# Patient Record
Sex: Female | Born: 1945 | Race: White | Hispanic: No | Marital: Married | Smoking: Never smoker
Health system: Southern US, Community
[De-identification: ages and names within clinical notes are randomized; demographics above are authoritative.]

## PROBLEM LIST (undated history)

## (undated) DIAGNOSIS — R600 Localized edema: Secondary | ICD-10-CM

## (undated) DIAGNOSIS — L03116 Cellulitis of left lower limb: Secondary | ICD-10-CM

## (undated) DIAGNOSIS — K5792 Diverticulitis of intestine, part unspecified, without perforation or abscess without bleeding: Secondary | ICD-10-CM

## (undated) DIAGNOSIS — M8580 Other specified disorders of bone density and structure, unspecified site: Secondary | ICD-10-CM

## (undated) DIAGNOSIS — E1149 Type 2 diabetes mellitus with other diabetic neurological complication: Secondary | ICD-10-CM

## (undated) DIAGNOSIS — E785 Hyperlipidemia, unspecified: Secondary | ICD-10-CM

## (undated) DIAGNOSIS — I872 Venous insufficiency (chronic) (peripheral): Secondary | ICD-10-CM

## (undated) DIAGNOSIS — I10 Essential (primary) hypertension: Secondary | ICD-10-CM

## (undated) HISTORY — DX: Hyperlipidemia, unspecified: E78.5

## (undated) HISTORY — PX: COLECTOMY: SHX59

## (undated) HISTORY — DX: Essential (primary) hypertension: I10

## (undated) HISTORY — PX: CHOLECYSTECTOMY: SHX55

## (undated) HISTORY — DX: Cellulitis of left lower limb: L03.116

## (undated) HISTORY — PX: APPENDECTOMY: SHX54

## (undated) HISTORY — PX: ABDOMINAL HYSTERECTOMY: SHX81

## (undated) HISTORY — DX: Other specified disorders of bone density and structure, unspecified site: M85.80

## (undated) HISTORY — DX: Diverticulitis of intestine, part unspecified, without perforation or abscess without bleeding: K57.92

## (undated) HISTORY — DX: Type 2 diabetes mellitus with other diabetic neurological complication: E11.49

## (undated) HISTORY — DX: Morbid (severe) obesity due to excess calories: E66.01

## (undated) HISTORY — DX: Venous insufficiency (chronic) (peripheral): I87.2

## (undated) HISTORY — DX: Localized edema: R60.0

---

## 1997-10-05 ENCOUNTER — Emergency Department (HOSPITAL_COMMUNITY): Admission: EM | Admit: 1997-10-05 | Discharge: 1997-10-05 | Payer: Self-pay | Admitting: Endocrinology

## 1997-10-07 ENCOUNTER — Emergency Department (HOSPITAL_COMMUNITY): Admission: EM | Admit: 1997-10-07 | Discharge: 1997-10-07 | Payer: Self-pay | Admitting: Endocrinology

## 1997-10-12 ENCOUNTER — Emergency Department (HOSPITAL_COMMUNITY): Admission: EM | Admit: 1997-10-12 | Discharge: 1997-10-12 | Payer: Self-pay | Admitting: Endocrinology

## 2000-05-12 ENCOUNTER — Emergency Department (HOSPITAL_COMMUNITY): Admission: EM | Admit: 2000-05-12 | Discharge: 2000-05-12 | Payer: Self-pay | Admitting: Emergency Medicine

## 2000-09-05 ENCOUNTER — Emergency Department (HOSPITAL_COMMUNITY): Admission: EM | Admit: 2000-09-05 | Discharge: 2000-09-05 | Payer: Self-pay

## 2000-10-07 ENCOUNTER — Emergency Department (HOSPITAL_COMMUNITY): Admission: EM | Admit: 2000-10-07 | Discharge: 2000-10-07 | Payer: Self-pay | Admitting: Emergency Medicine

## 2000-10-09 ENCOUNTER — Emergency Department (HOSPITAL_COMMUNITY): Admission: EM | Admit: 2000-10-09 | Discharge: 2000-10-09 | Payer: Self-pay | Admitting: Emergency Medicine

## 2000-10-22 ENCOUNTER — Ambulatory Visit (HOSPITAL_COMMUNITY): Admission: RE | Admit: 2000-10-22 | Discharge: 2000-10-22 | Payer: Self-pay | Admitting: Internal Medicine

## 2000-10-22 ENCOUNTER — Encounter: Admission: RE | Admit: 2000-10-22 | Discharge: 2000-10-22 | Payer: Self-pay | Admitting: Internal Medicine

## 2000-11-04 ENCOUNTER — Ambulatory Visit (HOSPITAL_COMMUNITY): Admission: RE | Admit: 2000-11-04 | Discharge: 2000-11-04 | Payer: Self-pay | Admitting: Internal Medicine

## 2000-11-09 ENCOUNTER — Encounter: Admission: RE | Admit: 2000-11-09 | Discharge: 2000-11-09 | Payer: Self-pay | Admitting: Internal Medicine

## 2000-11-10 ENCOUNTER — Ambulatory Visit (HOSPITAL_COMMUNITY): Admission: RE | Admit: 2000-11-10 | Discharge: 2000-11-10 | Payer: Self-pay

## 2000-11-23 ENCOUNTER — Encounter: Admission: RE | Admit: 2000-11-23 | Discharge: 2000-11-23 | Payer: Self-pay | Admitting: Internal Medicine

## 2001-01-31 ENCOUNTER — Encounter: Admission: RE | Admit: 2001-01-31 | Discharge: 2001-01-31 | Payer: Self-pay | Admitting: Internal Medicine

## 2001-08-22 ENCOUNTER — Encounter: Admission: RE | Admit: 2001-08-22 | Discharge: 2001-08-22 | Payer: Self-pay | Admitting: Internal Medicine

## 2001-08-22 ENCOUNTER — Ambulatory Visit (HOSPITAL_COMMUNITY): Admission: RE | Admit: 2001-08-22 | Discharge: 2001-08-22 | Payer: Self-pay | Admitting: Internal Medicine

## 2001-08-24 ENCOUNTER — Encounter: Admission: RE | Admit: 2001-08-24 | Discharge: 2001-08-24 | Payer: Self-pay | Admitting: Internal Medicine

## 2001-09-30 ENCOUNTER — Encounter: Admission: RE | Admit: 2001-09-30 | Discharge: 2001-09-30 | Payer: Self-pay | Admitting: Internal Medicine

## 2001-10-05 ENCOUNTER — Encounter: Admission: RE | Admit: 2001-10-05 | Discharge: 2001-10-05 | Payer: Self-pay | Admitting: Internal Medicine

## 2001-10-07 ENCOUNTER — Encounter: Admission: RE | Admit: 2001-10-07 | Discharge: 2001-10-07 | Payer: Self-pay | Admitting: Internal Medicine

## 2001-11-10 ENCOUNTER — Encounter: Admission: RE | Admit: 2001-11-10 | Discharge: 2001-11-10 | Payer: Self-pay | Admitting: Internal Medicine

## 2001-12-05 ENCOUNTER — Emergency Department (HOSPITAL_COMMUNITY): Admission: EM | Admit: 2001-12-05 | Discharge: 2001-12-05 | Payer: Self-pay | Admitting: Emergency Medicine

## 2001-12-05 ENCOUNTER — Encounter: Payer: Self-pay | Admitting: *Deleted

## 2002-02-05 ENCOUNTER — Emergency Department (HOSPITAL_COMMUNITY): Admission: EM | Admit: 2002-02-05 | Discharge: 2002-02-05 | Payer: Self-pay | Admitting: Emergency Medicine

## 2002-04-06 ENCOUNTER — Encounter: Admission: RE | Admit: 2002-04-06 | Discharge: 2002-04-06 | Payer: Self-pay | Admitting: Internal Medicine

## 2002-04-11 ENCOUNTER — Ambulatory Visit (HOSPITAL_COMMUNITY): Admission: RE | Admit: 2002-04-11 | Discharge: 2002-04-11 | Payer: Self-pay | Admitting: Internal Medicine

## 2002-06-14 ENCOUNTER — Encounter: Admission: RE | Admit: 2002-06-14 | Discharge: 2002-06-14 | Payer: Self-pay | Admitting: Internal Medicine

## 2002-06-14 ENCOUNTER — Encounter: Payer: Self-pay | Admitting: Internal Medicine

## 2002-06-14 ENCOUNTER — Ambulatory Visit (HOSPITAL_COMMUNITY): Admission: RE | Admit: 2002-06-14 | Discharge: 2002-06-14 | Payer: Self-pay | Admitting: Internal Medicine

## 2002-07-05 ENCOUNTER — Emergency Department (HOSPITAL_COMMUNITY): Admission: EM | Admit: 2002-07-05 | Discharge: 2002-07-05 | Payer: Self-pay | Admitting: Emergency Medicine

## 2002-07-06 ENCOUNTER — Encounter: Admission: RE | Admit: 2002-07-06 | Discharge: 2002-07-06 | Payer: Self-pay | Admitting: Internal Medicine

## 2002-08-31 ENCOUNTER — Encounter: Admission: RE | Admit: 2002-08-31 | Discharge: 2002-08-31 | Payer: Self-pay | Admitting: Internal Medicine

## 2002-09-21 ENCOUNTER — Emergency Department (HOSPITAL_COMMUNITY): Admission: EM | Admit: 2002-09-21 | Discharge: 2002-09-22 | Payer: Self-pay | Admitting: Emergency Medicine

## 2002-09-22 ENCOUNTER — Encounter: Payer: Self-pay | Admitting: Emergency Medicine

## 2002-10-02 ENCOUNTER — Encounter: Admission: RE | Admit: 2002-10-02 | Discharge: 2002-10-02 | Payer: Self-pay | Admitting: Internal Medicine

## 2002-10-04 ENCOUNTER — Encounter: Admission: RE | Admit: 2002-10-04 | Discharge: 2002-10-04 | Payer: Self-pay | Admitting: Internal Medicine

## 2003-03-15 ENCOUNTER — Encounter: Admission: RE | Admit: 2003-03-15 | Discharge: 2003-03-15 | Payer: Self-pay | Admitting: Internal Medicine

## 2003-11-08 ENCOUNTER — Encounter: Admission: RE | Admit: 2003-11-08 | Discharge: 2003-11-08 | Payer: Self-pay | Admitting: Internal Medicine

## 2003-11-16 ENCOUNTER — Encounter: Admission: RE | Admit: 2003-11-16 | Discharge: 2003-11-16 | Payer: Self-pay | Admitting: Internal Medicine

## 2003-11-23 ENCOUNTER — Ambulatory Visit: Payer: Self-pay | Admitting: Internal Medicine

## 2003-12-25 ENCOUNTER — Ambulatory Visit: Payer: Self-pay | Admitting: Internal Medicine

## 2004-01-08 ENCOUNTER — Ambulatory Visit: Payer: Self-pay | Admitting: Internal Medicine

## 2004-01-22 ENCOUNTER — Ambulatory Visit: Payer: Self-pay | Admitting: Internal Medicine

## 2004-01-29 ENCOUNTER — Ambulatory Visit: Payer: Self-pay | Admitting: Internal Medicine

## 2004-02-29 ENCOUNTER — Ambulatory Visit: Payer: Self-pay | Admitting: Internal Medicine

## 2004-03-03 ENCOUNTER — Ambulatory Visit: Payer: Self-pay | Admitting: Internal Medicine

## 2004-03-14 ENCOUNTER — Ambulatory Visit (HOSPITAL_COMMUNITY): Admission: RE | Admit: 2004-03-14 | Discharge: 2004-03-14 | Payer: Self-pay | Admitting: Internal Medicine

## 2004-05-20 ENCOUNTER — Ambulatory Visit: Payer: Self-pay | Admitting: Internal Medicine

## 2004-05-27 ENCOUNTER — Ambulatory Visit: Payer: Self-pay | Admitting: Internal Medicine

## 2004-06-16 ENCOUNTER — Emergency Department (HOSPITAL_COMMUNITY): Admission: EM | Admit: 2004-06-16 | Discharge: 2004-06-16 | Payer: Self-pay | Admitting: Emergency Medicine

## 2004-07-24 ENCOUNTER — Ambulatory Visit (HOSPITAL_COMMUNITY): Admission: RE | Admit: 2004-07-24 | Discharge: 2004-07-24 | Payer: Self-pay | Admitting: Internal Medicine

## 2004-07-24 ENCOUNTER — Ambulatory Visit: Payer: Self-pay | Admitting: Internal Medicine

## 2004-07-31 ENCOUNTER — Ambulatory Visit: Payer: Self-pay | Admitting: Internal Medicine

## 2004-08-08 ENCOUNTER — Ambulatory Visit: Payer: Self-pay | Admitting: Internal Medicine

## 2004-08-18 ENCOUNTER — Emergency Department (HOSPITAL_COMMUNITY): Admission: EM | Admit: 2004-08-18 | Discharge: 2004-08-18 | Payer: Self-pay | Admitting: Emergency Medicine

## 2005-02-17 ENCOUNTER — Ambulatory Visit: Payer: Self-pay | Admitting: Internal Medicine

## 2005-04-12 ENCOUNTER — Emergency Department (HOSPITAL_COMMUNITY): Admission: EM | Admit: 2005-04-12 | Discharge: 2005-04-12 | Payer: Self-pay | Admitting: Emergency Medicine

## 2005-08-26 ENCOUNTER — Ambulatory Visit: Payer: Self-pay | Admitting: Internal Medicine

## 2005-08-26 ENCOUNTER — Inpatient Hospital Stay (HOSPITAL_COMMUNITY): Admission: RE | Admit: 2005-08-26 | Discharge: 2005-08-28 | Payer: Self-pay | Admitting: Internal Medicine

## 2005-09-22 ENCOUNTER — Ambulatory Visit: Payer: Self-pay | Admitting: Internal Medicine

## 2005-09-28 ENCOUNTER — Ambulatory Visit (HOSPITAL_COMMUNITY): Admission: RE | Admit: 2005-09-28 | Discharge: 2005-09-28 | Payer: Self-pay | Admitting: Internal Medicine

## 2005-09-28 ENCOUNTER — Encounter (INDEPENDENT_AMBULATORY_CARE_PROVIDER_SITE_OTHER): Payer: Self-pay | Admitting: Internal Medicine

## 2005-10-06 ENCOUNTER — Ambulatory Visit: Payer: Self-pay | Admitting: Internal Medicine

## 2005-11-16 ENCOUNTER — Ambulatory Visit: Payer: Self-pay | Admitting: Internal Medicine

## 2005-11-16 ENCOUNTER — Inpatient Hospital Stay (HOSPITAL_COMMUNITY): Admission: EM | Admit: 2005-11-16 | Discharge: 2005-11-17 | Payer: Self-pay | Admitting: *Deleted

## 2005-11-20 ENCOUNTER — Ambulatory Visit: Payer: Self-pay | Admitting: Internal Medicine

## 2005-11-30 ENCOUNTER — Ambulatory Visit: Payer: Self-pay | Admitting: Internal Medicine

## 2006-02-03 ENCOUNTER — Ambulatory Visit: Payer: Self-pay | Admitting: Hospitalist

## 2006-03-08 DIAGNOSIS — E118 Type 2 diabetes mellitus with unspecified complications: Secondary | ICD-10-CM | POA: Insufficient documentation

## 2006-03-08 DIAGNOSIS — I10 Essential (primary) hypertension: Secondary | ICD-10-CM | POA: Insufficient documentation

## 2006-03-08 DIAGNOSIS — E1140 Type 2 diabetes mellitus with diabetic neuropathy, unspecified: Secondary | ICD-10-CM

## 2006-03-08 DIAGNOSIS — Z794 Long term (current) use of insulin: Secondary | ICD-10-CM | POA: Insufficient documentation

## 2006-03-08 DIAGNOSIS — L03119 Cellulitis of unspecified part of limb: Secondary | ICD-10-CM

## 2006-03-08 DIAGNOSIS — L02419 Cutaneous abscess of limb, unspecified: Secondary | ICD-10-CM | POA: Insufficient documentation

## 2006-03-08 DIAGNOSIS — E1165 Type 2 diabetes mellitus with hyperglycemia: Secondary | ICD-10-CM

## 2006-04-12 DIAGNOSIS — M949 Disorder of cartilage, unspecified: Secondary | ICD-10-CM

## 2006-04-12 DIAGNOSIS — M899 Disorder of bone, unspecified: Secondary | ICD-10-CM | POA: Insufficient documentation

## 2006-05-20 ENCOUNTER — Encounter (INDEPENDENT_AMBULATORY_CARE_PROVIDER_SITE_OTHER): Payer: Self-pay | Admitting: *Deleted

## 2006-05-20 ENCOUNTER — Ambulatory Visit: Payer: Self-pay | Admitting: Hospitalist

## 2006-05-20 ENCOUNTER — Telehealth (INDEPENDENT_AMBULATORY_CARE_PROVIDER_SITE_OTHER): Payer: Self-pay | Admitting: *Deleted

## 2006-05-20 LAB — CONVERTED CEMR LAB
Blood Glucose, Fingerstick: 191
Hgb A1c MFr Bld: 7.3 %

## 2006-05-21 ENCOUNTER — Ambulatory Visit: Payer: Self-pay | Admitting: Internal Medicine

## 2006-05-21 ENCOUNTER — Encounter (INDEPENDENT_AMBULATORY_CARE_PROVIDER_SITE_OTHER): Payer: Self-pay | Admitting: *Deleted

## 2006-05-21 LAB — CONVERTED CEMR LAB
BUN: 12 mg/dL (ref 6–23)
CO2: 27 meq/L (ref 19–32)
Calcium: 9.1 mg/dL (ref 8.4–10.5)
Potassium: 4.2 meq/L (ref 3.5–5.3)

## 2006-05-23 ENCOUNTER — Ambulatory Visit (HOSPITAL_COMMUNITY): Admission: RE | Admit: 2006-05-23 | Discharge: 2006-05-23 | Payer: Self-pay | Admitting: Internal Medicine

## 2006-05-28 ENCOUNTER — Telehealth: Payer: Self-pay | Admitting: *Deleted

## 2006-05-28 ENCOUNTER — Ambulatory Visit: Payer: Self-pay | Admitting: Internal Medicine

## 2006-08-18 ENCOUNTER — Telehealth: Payer: Self-pay | Admitting: *Deleted

## 2006-11-16 ENCOUNTER — Emergency Department (HOSPITAL_COMMUNITY): Admission: EM | Admit: 2006-11-16 | Discharge: 2006-11-16 | Payer: Self-pay | Admitting: Emergency Medicine

## 2007-01-17 ENCOUNTER — Ambulatory Visit: Payer: Self-pay | Admitting: Infectious Diseases

## 2007-02-14 ENCOUNTER — Telehealth: Payer: Self-pay | Admitting: *Deleted

## 2007-04-12 ENCOUNTER — Ambulatory Visit: Payer: Self-pay | Admitting: Infectious Disease

## 2007-04-12 ENCOUNTER — Encounter (INDEPENDENT_AMBULATORY_CARE_PROVIDER_SITE_OTHER): Payer: Self-pay | Admitting: Internal Medicine

## 2007-04-12 DIAGNOSIS — R300 Dysuria: Secondary | ICD-10-CM | POA: Insufficient documentation

## 2007-04-12 DIAGNOSIS — K921 Melena: Secondary | ICD-10-CM | POA: Insufficient documentation

## 2007-04-13 ENCOUNTER — Encounter (INDEPENDENT_AMBULATORY_CARE_PROVIDER_SITE_OTHER): Payer: Self-pay | Admitting: Internal Medicine

## 2007-04-13 LAB — CONVERTED CEMR LAB
ALT: 15 units/L (ref 0–35)
Alkaline Phosphatase: 73 units/L (ref 39–117)
BUN: 16 mg/dL (ref 6–23)
Basophils Relative: 1 % (ref 0–1)
CO2: 25 meq/L (ref 19–32)
Chloride: 104 meq/L (ref 96–112)
Creatinine, Urine: 120.2 mg/dL
Eosinophils Absolute: 0.3 10*3/uL (ref 0.0–0.7)
Glucose, Bld: 135 mg/dL — ABNORMAL HIGH (ref 70–99)
HCT: 41.7 % (ref 36.0–46.0)
Lymphs Abs: 2.8 10*3/uL (ref 0.7–4.0)
Monocytes Relative: 8 % (ref 3–12)
Neutro Abs: 4 10*3/uL (ref 1.7–7.7)
Nitrite: NEGATIVE
Platelets: 254 10*3/uL (ref 150–400)
Potassium: 4.1 meq/L (ref 3.5–5.3)
Protein, ur: NEGATIVE mg/dL
RBC / HPF: NONE SEEN (ref <3)
Sodium: 143 meq/L (ref 135–145)
Total Bilirubin: 0.6 mg/dL (ref 0.3–1.2)
Total Protein: 7 g/dL (ref 6.0–8.3)
Vit D, 1,25-Dihydroxy: 26 — ABNORMAL LOW (ref 30–89)

## 2007-04-14 ENCOUNTER — Telehealth (INDEPENDENT_AMBULATORY_CARE_PROVIDER_SITE_OTHER): Payer: Self-pay | Admitting: Internal Medicine

## 2007-04-14 LAB — CONVERTED CEMR LAB: OCCULT 1: NEGATIVE

## 2007-04-15 LAB — CONVERTED CEMR LAB: OCCULT 3: NEGATIVE

## 2007-04-18 ENCOUNTER — Encounter (INDEPENDENT_AMBULATORY_CARE_PROVIDER_SITE_OTHER): Payer: Self-pay | Admitting: Internal Medicine

## 2007-04-18 ENCOUNTER — Ambulatory Visit: Payer: Self-pay | Admitting: Internal Medicine

## 2007-04-18 LAB — CONVERTED CEMR LAB: Blood Glucose, Fingerstick: 121

## 2007-04-19 ENCOUNTER — Ambulatory Visit: Payer: Self-pay | Admitting: Internal Medicine

## 2007-07-17 ENCOUNTER — Encounter (INDEPENDENT_AMBULATORY_CARE_PROVIDER_SITE_OTHER): Payer: Self-pay | Admitting: *Deleted

## 2007-07-17 ENCOUNTER — Emergency Department (HOSPITAL_COMMUNITY): Admission: EM | Admit: 2007-07-17 | Discharge: 2007-07-17 | Payer: Self-pay | Admitting: Emergency Medicine

## 2007-07-17 DIAGNOSIS — L02619 Cutaneous abscess of unspecified foot: Secondary | ICD-10-CM | POA: Insufficient documentation

## 2007-07-17 DIAGNOSIS — L03039 Cellulitis of unspecified toe: Secondary | ICD-10-CM

## 2007-07-18 ENCOUNTER — Ambulatory Visit: Payer: Self-pay | Admitting: Hospitalist

## 2007-07-18 ENCOUNTER — Telehealth (INDEPENDENT_AMBULATORY_CARE_PROVIDER_SITE_OTHER): Payer: Self-pay | Admitting: *Deleted

## 2007-07-18 DIAGNOSIS — M79609 Pain in unspecified limb: Secondary | ICD-10-CM | POA: Insufficient documentation

## 2007-07-18 LAB — CONVERTED CEMR LAB: Blood Glucose, Fingerstick: 144

## 2007-08-22 ENCOUNTER — Telehealth (INDEPENDENT_AMBULATORY_CARE_PROVIDER_SITE_OTHER): Payer: Self-pay | Admitting: Internal Medicine

## 2007-10-31 ENCOUNTER — Emergency Department (HOSPITAL_COMMUNITY): Admission: EM | Admit: 2007-10-31 | Discharge: 2007-10-31 | Payer: Self-pay | Admitting: Emergency Medicine

## 2007-11-03 ENCOUNTER — Ambulatory Visit: Payer: Self-pay | Admitting: Internal Medicine

## 2007-11-03 ENCOUNTER — Inpatient Hospital Stay (HOSPITAL_COMMUNITY): Admission: EM | Admit: 2007-11-03 | Discharge: 2007-11-05 | Payer: Self-pay | Admitting: Emergency Medicine

## 2007-11-05 ENCOUNTER — Encounter (INDEPENDENT_AMBULATORY_CARE_PROVIDER_SITE_OTHER): Payer: Self-pay | Admitting: *Deleted

## 2007-11-05 DIAGNOSIS — K5732 Diverticulitis of large intestine without perforation or abscess without bleeding: Secondary | ICD-10-CM | POA: Insufficient documentation

## 2007-11-05 DIAGNOSIS — E876 Hypokalemia: Secondary | ICD-10-CM | POA: Insufficient documentation

## 2007-11-05 DIAGNOSIS — A0472 Enterocolitis due to Clostridium difficile, not specified as recurrent: Secondary | ICD-10-CM | POA: Insufficient documentation

## 2007-11-09 ENCOUNTER — Ambulatory Visit: Payer: Self-pay | Admitting: Internal Medicine

## 2007-11-09 ENCOUNTER — Telehealth (INDEPENDENT_AMBULATORY_CARE_PROVIDER_SITE_OTHER): Payer: Self-pay | Admitting: *Deleted

## 2007-11-09 ENCOUNTER — Encounter (INDEPENDENT_AMBULATORY_CARE_PROVIDER_SITE_OTHER): Payer: Self-pay | Admitting: *Deleted

## 2007-11-10 LAB — CONVERTED CEMR LAB
AST: 56 units/L — ABNORMAL HIGH (ref 0–37)
Alkaline Phosphatase: 74 units/L (ref 39–117)
Chloride: 106 meq/L (ref 96–112)
Cholesterol: 144 mg/dL (ref 0–200)
Glucose, Bld: 142 mg/dL — ABNORMAL HIGH (ref 70–99)
LDL Cholesterol: 76 mg/dL (ref 0–99)
Potassium: 4.3 meq/L (ref 3.5–5.3)
Total Bilirubin: 0.4 mg/dL (ref 0.3–1.2)
Total Protein: 6.1 g/dL (ref 6.0–8.3)

## 2007-11-11 ENCOUNTER — Telehealth (INDEPENDENT_AMBULATORY_CARE_PROVIDER_SITE_OTHER): Payer: Self-pay | Admitting: *Deleted

## 2008-02-06 ENCOUNTER — Telehealth: Payer: Self-pay | Admitting: Internal Medicine

## 2008-02-06 ENCOUNTER — Emergency Department (HOSPITAL_COMMUNITY): Admission: EM | Admit: 2008-02-06 | Discharge: 2008-02-06 | Payer: Self-pay | Admitting: Emergency Medicine

## 2008-02-06 ENCOUNTER — Ambulatory Visit: Payer: Self-pay | Admitting: *Deleted

## 2008-02-06 LAB — CONVERTED CEMR LAB: Hgb A1c MFr Bld: 7.2 %

## 2008-03-11 ENCOUNTER — Emergency Department (HOSPITAL_COMMUNITY): Admission: EM | Admit: 2008-03-11 | Discharge: 2008-03-11 | Payer: Self-pay | Admitting: Emergency Medicine

## 2008-03-19 ENCOUNTER — Telehealth (INDEPENDENT_AMBULATORY_CARE_PROVIDER_SITE_OTHER): Payer: Self-pay | Admitting: Internal Medicine

## 2008-04-18 ENCOUNTER — Telehealth (INDEPENDENT_AMBULATORY_CARE_PROVIDER_SITE_OTHER): Payer: Self-pay | Admitting: *Deleted

## 2008-08-04 ENCOUNTER — Emergency Department (HOSPITAL_COMMUNITY): Admission: EM | Admit: 2008-08-04 | Discharge: 2008-08-04 | Payer: Self-pay | Admitting: Emergency Medicine

## 2008-09-26 ENCOUNTER — Telehealth (INDEPENDENT_AMBULATORY_CARE_PROVIDER_SITE_OTHER): Payer: Self-pay | Admitting: *Deleted

## 2008-09-28 ENCOUNTER — Telehealth (INDEPENDENT_AMBULATORY_CARE_PROVIDER_SITE_OTHER): Payer: Self-pay | Admitting: *Deleted

## 2008-12-25 ENCOUNTER — Ambulatory Visit: Payer: Self-pay | Admitting: Internal Medicine

## 2009-03-14 ENCOUNTER — Telehealth: Payer: Self-pay | Admitting: Internal Medicine

## 2009-08-14 ENCOUNTER — Ambulatory Visit: Payer: Self-pay | Admitting: Internal Medicine

## 2009-08-14 LAB — CONVERTED CEMR LAB
Blood Glucose, Fingerstick: 110
Hgb A1c MFr Bld: 6.7 %

## 2009-08-21 ENCOUNTER — Telehealth (INDEPENDENT_AMBULATORY_CARE_PROVIDER_SITE_OTHER): Payer: Self-pay | Admitting: *Deleted

## 2009-08-21 ENCOUNTER — Ambulatory Visit: Payer: Self-pay | Admitting: Internal Medicine

## 2009-08-27 LAB — CONVERTED CEMR LAB
CO2: 24 meq/L (ref 19–32)
Calcium: 9.8 mg/dL (ref 8.4–10.5)
Chloride: 104 meq/L (ref 96–112)
Cholesterol: 221 mg/dL — ABNORMAL HIGH (ref 0–200)
Creatinine, Ser: 0.68 mg/dL (ref 0.40–1.20)
Glucose, Bld: 137 mg/dL — ABNORMAL HIGH (ref 70–99)
LDL Cholesterol: 150 mg/dL — ABNORMAL HIGH (ref 0–99)
Sodium: 140 meq/L (ref 135–145)
Total CHOL/HDL Ratio: 4.9
Triglycerides: 128 mg/dL (ref <150)

## 2009-08-29 ENCOUNTER — Telehealth (INDEPENDENT_AMBULATORY_CARE_PROVIDER_SITE_OTHER): Payer: Self-pay | Admitting: Internal Medicine

## 2009-09-17 ENCOUNTER — Telehealth: Payer: Self-pay | Admitting: Internal Medicine

## 2009-12-24 ENCOUNTER — Ambulatory Visit: Payer: Self-pay | Admitting: Internal Medicine

## 2010-03-20 ENCOUNTER — Telehealth: Payer: Self-pay | Admitting: Internal Medicine

## 2010-04-08 ENCOUNTER — Telehealth: Payer: Self-pay | Admitting: Internal Medicine

## 2010-04-13 ENCOUNTER — Encounter: Payer: Self-pay | Admitting: Internal Medicine

## 2010-04-17 ENCOUNTER — Ambulatory Visit: Admission: RE | Admit: 2010-04-17 | Discharge: 2010-04-17 | Payer: Self-pay | Source: Home / Self Care

## 2010-04-17 DIAGNOSIS — F4321 Adjustment disorder with depressed mood: Secondary | ICD-10-CM | POA: Insufficient documentation

## 2010-04-17 LAB — CONVERTED CEMR LAB
ALT: 19 U/L
AST: 18 U/L
Albumin: 4.5 g/dL
Alkaline Phosphatase: 72 U/L
BUN: 18 mg/dL
Blood Glucose, AC Bkfst: 162 mg/dL
CO2: 26 meq/L
Calcium: 9.3 mg/dL
Chloride: 102 meq/L
Cholesterol: 197 mg/dL
Creatinine, Ser: 0.65 mg/dL
Glucose, Bld: 152 mg/dL — ABNORMAL HIGH
HDL: 54 mg/dL
Hgb A1c MFr Bld: 7 %
LDL Cholesterol: 118 mg/dL — ABNORMAL HIGH
Potassium: 4.4 meq/L
Sodium: 138 meq/L
Total Bilirubin: 0.6 mg/dL
Total CHOL/HDL Ratio: 3.6
Total Protein: 6.9 g/dL
Triglycerides: 125 mg/dL
VLDL: 25 mg/dL

## 2010-04-17 LAB — GLUCOSE, CAPILLARY: Glucose-Capillary: 162 mg/dL — ABNORMAL HIGH (ref 70–99)

## 2010-04-22 NOTE — Assessment & Plan Note (Signed)
Summary: CHECKUP/SB.   Vital Signs:  Patient profile:   65 year old female Height:      64 inches (162.56 cm) Weight:      260.02 pounds (118.19 kg) BMI:     44.79 Temp:     98.5 degrees F (36.94 degrees C) oral Pulse rate:   86 / minute BP sitting:   160 / 92  (right arm)  Vitals Entered By: Angelina Ok RN (Aug 14, 2009 10:55 AM) Is Patient Diabetic? Yes Did you bring your meter with you today? Yes Pain Assessment Patient in pain? yes     Location: knees Intensity: 10 Type: aching Onset of pain  Constant Nutritional Status BMI of > 30 = obese CBG Result 110  Have you ever been in a relationship where you felt threatened, hurt or afraid?No   Does patient need assistance? Functional Status Self care Ambulation Normal Comments Check up.    Primary Care Provider:  Zara Council MD   History of Present Illness: Debra [name] is a 65 year old Female with PMH/problems as outlined in the EMR, who presents to the [facility] for routine follow. Her younger sister passed away recently and hence is feeling very stressed.  1. DM/HtN: She is taking her metformin but not taking her lasix, as it gives her a burning sensation.  2. Bilat. knee pain: wants to get checked for bilateral knee pain that has been going on for months. Hurts when she tries to walk.  3. Constipation/diarrhea: has had problem with changing bowel habits for the past month or so. Last colonoscopy few years ago, doesn't recall when. Has history of ovarian cancer in sisters.  4. Wound on leg: Recurrent skin infection on right lower extremity. Has similar history in the past.       Depression History:      The patient denies a depressed mood most of the day and a diminished interest in her usual daily activities.         Preventive Screening-Counseling & Management  Alcohol-Tobacco     Smoking Status: never  Current Medications (verified): 1)  Lasix 20 Mg Tabs (Furosemide) .... Once Daily 2)  Metformin Hcl 1000  Mg Tabs (Metformin Hcl) .... Take 1 Tablet By Mouth Two Times A Day 3)  Calcium 500/d 500-400 Mg-Unit  Chew (Calcium-Vitamin D) .... Take 2 Tabs By Mouth Once Daily 4)  Famotidine 10 Mg  Tabs (Famotidine) .... Take One Tab Once Daily As Needed For Heartburn  Allergies (verified): 1)  ! Penicillin 2)  ! Aspirin 3)  ! Hydrocodone 4)  ! Latex Exam Gloves (Disposable Gloves)  Past History:  Past Medical History: Last updated: 04/18/2007 Diabetes mellitus, type II Hypertension Hyperlipidemia Osteopenia DEXA scan 7/07 Chronic venous insufficiency Morbid obesity H/o Diverticulitis 08/2005 Leg edema sec to chronic venous insufficiency H/o left leg celulitis, most recent episode 01/09  Past Surgical History: Last updated: 04/12/2007 Appendectomy Hysterectomy 1990 S/P colectomy with diverting colostomy and revision in the 1990s for diverticulitis  Social History: Last updated: 04/18/2007 One sister with a chronic progressive disease requiring a trach, and 24/7 care. One sister with an abrupt hospitalization for a critical condition 01/09  Risk Factors: Smoking Status: never (08/14/2009)  Review of Systems       As per HPI  Physical Exam  General:  alert, well-developed, and overweight-appearing.   Neck:  supple.   Lungs:  normal respiratory effort and normal breath sounds.   Heart:  normal rate and regular rhythm.  Abdomen:  soft and non-tender.   Msk:  Bilat knee: non tender, no signs of active inflammation.  Pulses:  normal peripheral pulses  Extremities:  no cyanosis, clubbing or edema  Neurologic:  non focal.  Skin:  R LE: superficial boils, multiple, with excoriation marks.  Psych:  normally interactive.     Impression & Recommendations:  Problem # 1:  HYPERTENSION (ICD-401.9) I will switch to lisinopril as she did not tolerate lasix. Review in two week.   Her updated medication list for this problem includes:    Lisinopril 5 Mg Tabs (Lisinopril) .Marland Kitchen... Take 1  tablet by mouth once a day  Future Orders: T-Basic Metabolic Panel [id number]) ... 08/16/2009 T-Lipid Profile [id number]) ... 08/16/2009  Problem # 2:  DIABETES MELLITUS, TYPE II (ICD-250.00) A1c: 6.7 (08/14/2009 10:49:11 AM)  MICROALB/CR: 12.8 (04/13/2007 2:30:00 AM) LDL: 76 (11/09/2007 8:36:00 PM) EYE: will make appointment herself.  FOOT: will check foot on next visit.  WEIGHT: 44.79 (08/14/2009 10:49:11 AM)    Continue with the current regimen.    Her updated medication list for this problem includes:    Lisinopril 5 Mg Tabs (Lisinopril) .Marland Kitchen... Take 1 tablet by mouth once a day    Metformin Hcl 1000 Mg Tabs (Metformin hcl) .Marland Kitchen... Take 1 tablet by mouth two times a day  Orders: T- Capillary Blood Glucose ([id number]) T-Hgb A1C (in-house) ([id number])Future Orders: T-Basic Metabolic Panel [id number]) ... 08/16/2009 T-Lipid Profile [id number]) ... 08/16/2009  Problem # 3:  HYPERLIPIDEMIA (ICD-272.4) Will check FLP.   Problem # 4:  Preventive Health Care (ICD-V70.0) Alternating bowel habits is concerning. Will make an appointment for colonoscopy.   Complete Medication List: 1)  Lisinopril 5 Mg Tabs (Lisinopril) .... Take 1 tablet by mouth once a day 2)  Metformin Hcl 1000 Mg Tabs (Metformin hcl) .... Take 1 tablet by mouth two times a day 3)  Calcium 500/d 500-400 Mg-unit Chew (Calcium-vitamin d) .... Take 2 tabs by mouth once daily 4)  Famotidine 10 Mg Tabs (Famotidine) .... Take one tab once daily as needed for heartburn 5)  Doxycycline Hyclate 100 Mg Caps (Doxycycline hyclate) .... Take 1 tablet by mouth two times a day  Other Orders: Mammogram (Screening) (Mammo) Gastroenterology Referral (GI)  Patient Instructions: 1)  Please schedule a follow-up appointment in 2 weeks. 2)  Please come back for a fasting blood work.  3)  We will let you know if anything wrong with your lab work.   Prescriptions: DOXYCYCLINE HYCLATE 100 MG CAPS (DOXYCYCLINE HYCLATE) Take 1 tablet by  mouth two times a day  #20 x 0   Entered and Authorized by:   Zara Council MD   Signed by:   Zara Council MD on 08/14/2009   Method used:   Electronically to        [facility] [phone]* (retail)       [address]       [place], [place]  [address]       Ph: [phone]       Fax: [phone]   RxID:   [id number] LISINOPRIL 5 MG TABS (LISINOPRIL) Take 1 tablet by mouth once a day  #31 x 6   Entered and Authorized by:   Zara Council MD   Signed by:   Zara Council MD on 08/14/2009   Method used:   Electronically to        [facility] [phone]* (retail)       [address]  [place], [place]  [address]       Ph: [phone]       Fax: [phone]   RxID:   [id number]   Prevention & Chronic Care Immunizations   Influenza vaccine: Fluvax Non-MCR  (12/25/2008)   Influenza vaccine deferral: Deferred  (08/14/2009)    Tetanus booster: Not documented   Td booster deferral: Deferred  (08/14/2009)    Pneumococcal vaccine: Not documented    H. zoster vaccine: Not documented   H. zoster vaccine deferral: Deferred  (08/14/2009)  Colorectal Screening   Hemoccult: Not documented    Colonoscopy: Not documented   Colonoscopy action/deferral: GI referral  (08/14/2009)  Other Screening   Pap smear: Not documented   Pap smear action/deferral: Deferred  (08/14/2009)    Mammogram: Not documented   Mammogram action/deferral: Ordered  (08/14/2009)    DXA bone density scan: normal at right femur, -1 at left femur, -1.9 at spine, c/w osteopenia  (09/28/2005)   Smoking status: never  (08/14/2009)  Diabetes Mellitus   HgbA1C: 6.7  (08/14/2009)    Eye exam: Not documented    Foot exam: Not documented   High risk foot: Not documented   Foot care education: Not documented    Urine microalbumin/creatinine ratio: 12.8  (04/13/2007)    Diabetes flowsheet reviewed?: Yes   Progress toward A1C goal: Unchanged  Lipids   Total Cholesterol: 144  (11/09/2007)    LDL: 76  (11/09/2007)   LDL Direct: Not documented   HDL: 40  (11/09/2007)   Triglycerides: 139  (11/09/2007)    SGOT (AST): 56  (11/09/2007)   SGPT (ALT): 52  (11/09/2007)   Alkaline phosphatase: 74  (11/09/2007)   Total bilirubin: 0.4  (11/09/2007)    Lipid flowsheet reviewed?: Yes   Progress toward LDL goal: Unchanged  Hypertension   Last Blood Pressure: 160 / 92  (08/14/2009)   Serum creatinine: 0.63  (11/09/2007)   Serum potassium 4.3  (11/09/2007)    Hypertension flowsheet reviewed?: Yes   Progress toward BP goal: Unchanged  Self-Management Support :    Patient will work on the following items until the next clinic visit to reach self-care goals:     Medications and monitoring: take my medicines every day, check my blood sugar, bring all of my medications to every visit, examine my feet every day  (08/14/2009)     Eating: drink diet soda or water instead of juice or soda, eat more vegetables, use fresh or frozen vegetables, eat foods that are low in salt, eat baked foods instead of fried foods, eat fruit for snacks and desserts, limit or avoid alcohol  (08/14/2009)     Activity: take a 30 minute walk every day  (08/14/2009)    Diabetes self-management support: Written self-care plan, Education handout, Pre-printed educational material  (08/14/2009)   Diabetes care plan printed   Diabetes education handout printed    Hypertension self-management support: Written self-care plan, Education handout, Pre-printed educational material  (08/14/2009)   Hypertension self-care plan printed.   Hypertension education handout printed    Lipid self-management support: Written self-care plan, Education handout, Pre-printed educational material  (08/14/2009)   Lipid self-care plan printed.   Lipid education handout printed   Nursing Instructions: Schedule screening mammogram (see order)    Vital Signs:  Patient profile:   65 year old female Height:      64 inches (162.56  cm) Weight:      260.02 pounds (118.19 kg) BMI:     44.79 Temp:  98.5 degrees F (36.94 degrees C) oral Pulse rate:   86 / minute BP sitting:   160 / 92  (right arm)  Vitals Entered By: Angelina Ok RN (Aug 14, 2009 10:55 AM)  Process Orders Check Orders Results:     [facility]: ABN not required for this insurance Tests Sent for requisitioning (Aug 14, 2009 12:04 PM):     08/16/2009: [facility] -- T-Basic Metabolic Panel [phone] (signed)     08/16/2009: [facility] -- T-Lipid Profile [phone] (signed)   Laboratory Results   Blood Tests   Date/Time Received: Aug 14, 2009 11:13 AM  Date/Time Reported: Burke Keels  Aug 14, 2009 11:13 AM   HGBA1C: 6.7%   (Normal Range: Non-Diabetic - 3-6%   Control Diabetic - 6-8%) CBG Random:: 110mg /dL

## 2010-04-22 NOTE — Assessment & Plan Note (Signed)
Summary: FLU SHOT/CFB  Nurse Visit   Allergies: 1)  ! Penicillin 2)  ! Aspirin 3)  ! Hydrocodone 4)  ! Latex Exam Gloves (Disposable Gloves)  Immunizations Administered:  Influenza Vaccine # 1:    Vaccine Type: Fluvax Non-MCR    Site: left deltoid    Mfr: Sanofi Pasteur    Dose: 0.5 ml    Route: IM    Given by: Chinita Pester RN    Exp. Date: 09/20/2010    Lot #: [id number]    VIS given: 10/15/09 version given December 24, 2009.  Flu Vaccine Consent Questions:    Do you have a history of severe allergic reactions to this vaccine? no    Any prior history of allergic reactions to egg and/or gelatin? no    Do you have a sensitivity to the preservative Thimersol? no    Do you have a past history of Guillan-Barre Syndrome? no    Do you currently have an acute febrile illness? no    Have you ever had a severe reaction to latex? yes    Vaccine information given and explained to patient? yes    Are you currently pregnant? no  Orders Added: 1)  Influenza Vaccine NON MCR [00028]

## 2010-04-22 NOTE — Progress Notes (Signed)
Summary: phone/gg  Phone Note Call from Patient   Caller: Patient Summary of Call: Pt started on PRAVASTATIN  3 days ago and she has been having  stomach cramps, which last through the night and into late morning. Should she stop the med? Pt # [id number] Initial call taken by: Merrie Roof RN,  August 29, 2009 2:28 PM  Follow-up for Phone Call        Is she taking it with food? If not try taking it with food and just a half of a pill. If it persists after 1-2 more days then stop it, especially as it is persisting through the night after taking it and then a few hours into the next day. Follow-up by: Zoila Shutter MD,  August 29, 2009 3:19 PM  Additional Follow-up for Phone Call Additional follow up Details #1::        Pt informed and voices understanding Additional Follow-up by: Merrie Roof RN,  August 29, 2009 4:00 PM

## 2010-04-22 NOTE — Progress Notes (Signed)
Summary: REfill/gh  Phone Note Refill Request Message from:  Fax from Pharmacy on September 17, 2009 3:03 PM  Refills Requested: Medication #1:  DOXYCYCLINE HYCLATE 100 MG CAPS Take 1 tablet by mouth two times a day   Last Refilled: 08/14/2009 Call to pt to see why she needs a refill. Number has been disconnected   Method Requested: Electronic Initial call taken by: Angelina Ok RN,  September 17, 2009 3:03 PM  Follow-up for Phone Call        Rx denied because cannot find indication for antibiotic.  If pt calls again, please inform her she will need an appt if she is having any acute complaint for which she believes she needs an antibiotic.  thank you!  she is also due for routine follow up. Follow-up by: Nelda Bucks DO,  September 19, 2009 8:10 AM     Appended Document: REfill/gh correction, pt is not due for routine f/u at [facility]; was last seen in 07/2009.  she missed her routine f/u with cardiology

## 2010-04-22 NOTE — Progress Notes (Signed)
Summary: Nos for previst  Ms. Newlun was a NOS today for her previsit.  Her phone has been disconnected and a note in EMR states that  mail has been returned from the address we have on file for her.  Therefore I am unable to contact the pt. and have cancelled her procedure with Dr. Juanda Chance scheduled for 09/05/09.

## 2010-04-24 NOTE — Progress Notes (Signed)
Summary: refill/gg  Phone Note Refill Request  on April 08, 2010 10:34 AM  Refills Requested: Medication #1:  PRAVASTATIN SODIUM 40 MG TABS Take 1 tablet by mouth once a day.   Last Refilled: 03/06/2010  Method Requested: Electronic Initial call taken by: Merrie Roof RN,  April 08, 2010 10:35 AM  Follow-up for Phone Call        Will submit refill for 1 month.  Pt has not been seen since 07/2009. She needs to make an appt and be seen before she will receive any additional refills. Follow-up by: Nelda Bucks DO,  April 09, 2010 1:31 PM    Prescriptions: PRAVASTATIN SODIUM 40 MG TABS (PRAVASTATIN SODIUM) Take 1 tablet by mouth once a day  #30 x 1   Entered and Authorized by:   Nelda Bucks DO   Signed by:   Nelda Bucks DO on 04/09/2010   Method used:   Electronically to        [facility] [phone]* (retail)       [address]       [place], [place]  [address]       Ph: [phone]       Fax: [phone]   RxID:   [id number]   Appended Document: refill/gg flag sent to [facility]

## 2010-04-24 NOTE — Progress Notes (Signed)
Summary: refill/gg  Phone Note Refill Request  on March 20, 2010 9:07 AM  Refills Requested: Medication #1:  LISINOPRIL 5 MG TABS Take 1 tablet by mouth once a day  Method Requested: Electronic Initial call taken by: Merrie Roof RN,  March 20, 2010 9:09 AM  Follow-up for Phone Call        Refill approved-nurse to complete Follow-up by: Julaine Fusi  DO,  March 20, 2010 9:10 AM    Prescriptions: LISINOPRIL 5 MG TABS (LISINOPRIL) Take 1 tablet by mouth once a day  #31 x 1   Entered and Authorized by:   Julaine Fusi  DO   Signed by:   Julaine Fusi  DO on 03/20/2010   Method used:   Electronically to        [facility] [phone]* (retail)       [address]       [place], [place]  [address]       Ph: [phone]       Fax: [phone]   RxID:   [id number]

## 2010-05-05 ENCOUNTER — Inpatient Hospital Stay (INDEPENDENT_AMBULATORY_CARE_PROVIDER_SITE_OTHER)
Admission: RE | Admit: 2010-05-05 | Discharge: 2010-05-05 | Disposition: A | Discharge status: Home / Self Care | Payer: Self-pay | Source: Ambulatory Visit | Attending: Family Medicine | Admitting: Family Medicine

## 2010-05-05 ENCOUNTER — Ambulatory Visit (INDEPENDENT_AMBULATORY_CARE_PROVIDER_SITE_OTHER): Payer: Self-pay

## 2010-05-05 ENCOUNTER — Telehealth: Payer: Self-pay | Admitting: *Deleted

## 2010-05-05 DIAGNOSIS — S9030XA Contusion of unspecified foot, initial encounter: Secondary | ICD-10-CM

## 2010-05-05 NOTE — Telephone Encounter (Signed)
Pt calls c/o dropping a small table on her L foot Friday, she has kept it elevated and iced since then, it continues to swell and is painful when she walks, she is advised to go to urg care now, she states she can do so and will. She is instructed to make f/u appts as needed in clinic

## 2010-05-05 NOTE — Telephone Encounter (Signed)
Thank you :)

## 2010-05-08 NOTE — Assessment & Plan Note (Signed)
Summary: EST-CK/FU/MEDS/CFB   Vital Signs:  Patient profile:   65 year old female Height:      64 inches (162.56 cm) Weight:      261.5 pounds (118.86 kg) BMI:     45.05 Temp:     97.1 degrees F (36.17 degrees C) oral Pulse rate:   66 / minute BP sitting:   168 / 83  (right arm) Cuff size:   large  Vitals Entered By: Theotis Barrio NT II (April 17, 2010 8:52 AM) CC: ROUTINE OFFICE VISIT  /  MEDICATION REFILL Is Patient Diabetic? Yes Did you bring your meter with you today? No Nutritional Status BMI of > 30 = obese  Have you ever been in a relationship where you felt threatened, hurt or afraid?No   Does patient need assistance? Functional Status Self care Ambulation Normal Comments ROUTINE OFFICE VISIT WITH MEDICATION REFILL   Primary Care Provider:  Zara Council MD  CC:  ROUTINE OFFICE VISIT  /  MEDICATION REFILL.  History of Present Illness: Debra [name] is a 65 year old Female with PMH/problems as outlined in the EMR, who presents to the [facility] for routine follow.   1. DM:  she is taking the metformin without adverse effect.  2. HTN: She reports compliance with her medicines.  Her BP is elevated this am but she states she did not take any of her medicines this morning.    3. HLD:  pt reports statin makes her feel nasueated despite eating it with dinner at night.  She stopped taking it 3 days ago and states her symptoms have resolved.   She has vomiting 1-2 times non-bloody, non-bilious emesis after taking her statin.  She thinks some of her symptoms may be related to increased stress.  Pt reports death of her mother on 2010/04/16.  She is able to talk with her sister at home and also states that her husband is also very supportive.  She is not interested in speaking with a grief counseling.  Pt requests refill of her medicines.  Preventive Screening-Counseling & Management  Alcohol-Tobacco     Smoking Status: never  Caffeine-Diet-Exercise     Does Patient  Exercise: yes     Type of exercise: SOME WALKING  Comments: PATIENT HAS PROBLEM WITH HER KNEES, WALKS WHEN ABLE  Current Medications (verified): 1)  Lisinopril 5 Mg Tabs (Lisinopril) .... Take 1 Tablet By Mouth Once A Day 2)  Metformin Hcl 1000 Mg Tabs (Metformin Hcl) .... Take 1 Tablet By Mouth Two Times A Day 3)  Calcium 500/d 500-400 Mg-Unit  Chew (Calcium-Vitamin D) .... Take 2 Tabs By Mouth Once Daily 4)  Famotidine 10 Mg  Tabs (Famotidine) .... Take One Tab Once Daily As Needed For Heartburn 5)  Doxycycline Hyclate 100 Mg Caps (Doxycycline Hyclate) .... Take 1 Tablet By Mouth Two Times A Day 6)  Pravastatin Sodium 40 Mg Tabs (Pravastatin Sodium) .... Take 1 Tablet By Mouth Once A Day 7)  Lac-Hydrin 12 % Lotn (Ammonium Lactate) .... Apply Two Times A Day To Feet  Allergies (verified): 1)  ! Penicillin 2)  ! Aspirin 3)  ! Hydrocodone 4)  ! Latex Exam Gloves (Disposable Gloves)  Past History:  Past medical, surgical, family and social histories (including risk factors) reviewed for relevance to current acute and chronic problems.  Past Medical History: Reviewed history from 04/18/2007 and no changes required. Diabetes mellitus, type II Hypertension Hyperlipidemia Osteopenia DEXA scan 7/07 Chronic venous insufficiency Morbid obesity H/o Diverticulitis 08/2005  Leg edema sec to chronic venous insufficiency H/o left leg celulitis, most recent episode 01/09  Past Surgical History: Reviewed history from 04/12/2007 and no changes required. Appendectomy Hysterectomy 1990 S/P colectomy with diverting colostomy and revision in the 1990s for diverticulitis  Family History: Reviewed history and no changes required.  Social History: Reviewed history from 04/18/2007 and no changes required. One sister with a chronic progressive disease requiring a trach, and 24/7 care. One sister with an abrupt hospitalization for a critical condition 01/09Does Patient Exercise:  yes  Physical  Exam  General:  alert, well-developed, and overweight-appearing.   Head:  normocephalic and atraumatic.  normocephalic and atraumatic.   Mouth:  pharynx pink and moist.   Neck:  supple.  full ROM and no masses.   Lungs:  normal respiratory effort and normal breath sounds.  no dullness, no crackles, and no wheezes.   Heart:  normal rate, regular rhythm, no murmur, no gallop, and no rub.   Abdomen:  soft, non-tender, and normal bowel sounds.   Extremities:  no cyanosis, clubbing or edema  Neurologic:  alert & oriented X3 and gait normal.  no focal deficit Skin:  turgor normal, color normal, and no rashes.   Psych:  Oriented X3, memory intact for recent and remote, normally interactive, good eye contact, not anxious appearing, and not depressed appearing.    Diabetes Management Exam:    Foot Exam (with socks and/or shoes not present):       Sensory-Monofilament:          Left foot: normal          Right foot: normal   Impression & Recommendations:  Problem # 1:  GRIEF REACTION, ACUTE (ICD-309.0) Pt is experiencing grief 2/2 the death of her mother.  SHe reports a good support system at home and declines referral to psych/grief couseling.  Pt denies suicidal/homicidal ideation.  WIll f/u with her at her next OV.  Problem # 2:  HYPERTENSION (ICD-401.9) BP elevated today above goal.  Pt did not take her am meds before her appt today.  Advised her to aim for 100% compliance with all meds.  WIll check CMET today and bring pt back in 2 monrths for recheck.  If BP remains elevated, would consider icnrease in ace vs addition of hctz.  Her updated medication list for this problem includes:    Lisinopril 5 Mg Tabs (Lisinopril) .Marland Kitchen... Take 1 tablet by mouth once a day  Orders: T-CMP with Estimated GFR ([address])  BP today: 168/83 Prior BP: 160/92 (08/14/2009)  Labs Reviewed: K+: 4.7 (08/21/2009) Creat: : 0.68 (08/21/2009)   Chol: 221 (08/21/2009)   HDL: 45 (08/21/2009)   LDL: 150  (08/21/2009)   TG: 128 (08/21/2009)  Problem # 3:  HYPERLIPIDEMIA (ICD-272.4) Pt not taking statin as outlined in HPI.  She is agreeable to trial of medicine to see if her symptoms are resolved.  Will check FLP and LFTs today and f/u at her next visit. Her updated medication list for this problem includes:    Pravastatin Sodium 40 Mg Tabs (Pravastatin sodium) .Marland Kitchen... Take 1 tablet by mouth once a day  Orders: T-Lipid Profile ([id number])  Problem # 4:  Preventive Health Care (ICD-V70.0) Pt is UTD on annual flu vax.  She declines colonoscopy but agrees to hemoccult cards for colon ca screening. >46min with at least 50% face to face time spent couseling pt about her grief and numerous chronic medical conditions.  Complete Medication List: 1)  Lisinopril 5 Mg  Tabs (Lisinopril) .... Take 1 tablet by mouth once a day 2)  Metformin Hcl 1000 Mg Tabs (Metformin hcl) .... Take 1 tablet by mouth two times a day 3)  Calcium 500/d 500-400 Mg-unit Chew (Calcium-vitamin d) .... Take 2 tabs by mouth once daily 4)  Famotidine 10 Mg Tabs (Famotidine) .... Take one tab once daily as needed for heartburn 5)  Doxycycline Hyclate 100 Mg Caps (Doxycycline hyclate) .... Take 1 tablet by mouth two times a day 6)  Pravastatin Sodium 40 Mg Tabs (Pravastatin sodium) .... Take 1 tablet by mouth once a day 7)  Lac-hydrin 12 % Lotn (Ammonium lactate) .... Apply two times a day to feet  Other Orders: T- Capillary Blood Glucose (57846) T-Hgb A1C (in-house) (96295MW) T-Hemoccult Card-Multiple (take home) (41324)  Patient Instructions: 1)  Please schedule a follow-up appointment in 2-3 months with Dr. Arvilla Market. 2)  Lac-hydrin is a lotion to help with the dry feet on your skin. 3)  I will call you if any of your lab work is abnormal 4)  Keep taking all of your medicine as directed Prescriptions: METFORMIN HCL 1000 MG TABS (METFORMIN HCL) Take 1 tablet by mouth two times a day  #60 x 11   Entered and Authorized by:    Nelda Bucks DO   Signed by:   Nelda Bucks DO on 04/17/2010   Method used:   Electronically to        [facility] [phone]* (retail)       [address]       [place], [place]  [address]       Ph: [phone]       Fax: [phone]   RxID:   [id number] LISINOPRIL 5 MG TABS (LISINOPRIL) Take 1 tablet by mouth once a day  #30 x 4   Entered and Authorized by:   Nelda Bucks DO   Signed by:   Nelda Bucks DO on 04/17/2010   Method used:   Electronically to        [facility] [phone]* (retail)       [address]       [place], [place]  [address]       Ph: [phone]       Fax: [phone]   RxID:   [id number] LAC-HYDRIN 12 % LOTN (AMMONIUM LACTATE) Apply two times a day to feet  #1 bottle x 3   Entered and Authorized by:   Nelda Bucks DO   Signed by:   Nelda Bucks DO on 04/17/2010   Method used:   Electronically to        [facility] [phone]* (retail)       [address]       [place], [place]  [address]       Ph: [phone]       Fax: [phone]   RxID:   [id number]    Orders Added: 1)  T- Capillary Blood Glucose [82948] 2)  T-Hgb A1C (in-house) [77824MP] 3)  T-Hemoccult Card-Multiple (take home) [82270] 4)  Est. Patient Level IV [53614] 5)  T-CMP with Estimated GFR [[address]] 6)  T-Lipid Profile [[id number]]   Process Orders Check Orders Results:     [facility]: ABN not required for this insurance Tests Sent for requisitioning (April 29, 2010 11:36 PM):     04/17/2010: [facility] -- T-CMP with Estimated GFR [[id number]] (signed)     04/17/2010: [facility] -- T-Lipid Profile [id number] (signed)  Prevention & Chronic Care Immunizations   Influenza vaccine: Fluvax Non-MCR  (12/24/2009)   Influenza vaccine deferral: Deferred  (08/14/2009)    Tetanus booster: Not documented   Td booster deferral: Deferred  (08/14/2009)    Pneumococcal vaccine: Not  documented    H. zoster vaccine: Not documented   H. zoster vaccine deferral: Deferred  (08/14/2009)  Colorectal Screening   Hemoccult: Not documented   Hemoccult action/deferral: Ordered  (04/17/2010)    Colonoscopy: Not documented   Colonoscopy action/deferral: Refused  (04/17/2010)  Other Screening   Pap smear: Not documented   Pap smear action/deferral: Deferred  (08/14/2009)    Mammogram: Not documented   Mammogram action/deferral: Ordered  (08/14/2009)    DXA bone density scan: normal at right femur, -1 at left femur, -1.9 at spine, c/w osteopenia  (09/28/2005)   Smoking status: never  (04/17/2010)  Diabetes Mellitus   HgbA1C: 7.0  (04/17/2010)    Eye exam: Not documented    Foot exam: yes  (04/17/2010)   High risk foot: Not documented   Foot care education: Not documented    Urine microalbumin/creatinine ratio: 12.8  (04/13/2007)    Diabetes flowsheet reviewed?: Yes   Progress toward A1C goal: At goal  Lipids   Total Cholesterol: 221  (08/21/2009)   LDL: 150  (08/21/2009)   LDL Direct: Not documented   HDL: 45  (08/21/2009)   Triglycerides: 128  (08/21/2009)    SGOT (AST): 56  (11/09/2007)   SGPT (ALT): 52  (11/09/2007)   Alkaline phosphatase: 74  (11/09/2007)   Total bilirubin: 0.4  (11/09/2007)    Lipid flowsheet reviewed?: Yes   Progress toward LDL goal: Deteriorated  Hypertension   Last Blood Pressure: 168 / 83  (04/17/2010)   Serum creatinine: 0.68  (08/21/2009)   Serum potassium 4.7  (08/21/2009)    Hypertension flowsheet reviewed?: Yes   Progress toward BP goal: Unchanged  Self-Management Support :   Personal Goals (by the next clinic visit) :     Personal A1C goal: 7  (04/17/2010)     Personal blood pressure goal: 130/80  (04/17/2010)     Personal LDL goal: 100  (04/17/2010)    Patient will work on the following items until the next clinic visit to reach self-care goals:     Medications and monitoring: take my medicines every day,  check my blood sugar, bring all of my medications to every visit, examine my feet every day  (04/17/2010)     Eating: drink diet soda or water instead of juice or soda, eat more vegetables, use fresh or frozen vegetables, eat foods that are low in salt, eat baked foods instead of fried foods, eat fruit for snacks and desserts, limit or avoid alcohol  (04/17/2010)     Activity: take a 30 minute walk every day  (08/14/2009)    Diabetes self-management support: Resources for patients handout  (04/17/2010)    Hypertension self-management support: Resources for patients handout  (04/17/2010)    Lipid self-management support: Resources for patients handout  (04/17/2010)        Resource handout printed.   Nursing Instructions: Provide Hemoccult cards with instructions (see order)    Laboratory Results   Blood Tests   Date/Time Received: April 17, 2010 9:24 AM  HGBA1C: 7.0%   (Normal Range: Non-Diabetic - 3-6%   Control Diabetic - 6-8%) CBG Fasting:: 162mg /dL  Comments: Alric Quan  April 17, 2010 9:25 AM       Last LDL:  150 (08/21/2009 9:15:00 PM)        Diabetic Foot Exam Foot Inspection Is there a history of a foot ulcer?              No Is there a foot ulcer now?              No Can the patient see the bottom of their feet?          Yes Are the shoes appropriate in style and fit?          Yes Is there swelling or an abnormal foot shape?          No Are the toenails long?                No Are the toenails thick?                No Are the toenails ingrown?              No Is there heavy callous build-up?              Yes Is there a claw toe deformity?                          No Is there elevated skin temperature?            No Is there limited ankle dorsiflexion?            No Do you have pain in calf while walking?           No         10-g (5.07) Semmes-Weinstein Monofilament Test Performed by: Theotis Barrio NT II          Right Foot          Left Foot Visual Inspection     normal           normal Test Control      normal         normal Site 1         normal         normal Site 2         normal         normal Site 3         normal         normal Site 4         normal         normal Site 5         normal         normal Site 6         normal         normal Site 7         normal         normal Site 8         normal         normal Site 9         normal         normal Site 10         normal         normal  Impression      normal         normal

## 2010-06-09 LAB — GLUCOSE, CAPILLARY: Glucose-Capillary: 110 mg/dL — ABNORMAL HIGH (ref 70–99)

## 2010-06-13 ENCOUNTER — Encounter: Payer: Self-pay | Admitting: Internal Medicine

## 2010-07-02 ENCOUNTER — Telehealth: Payer: Self-pay | Admitting: *Deleted

## 2010-07-02 NOTE — Telephone Encounter (Signed)
Pt c/o pain from rt side of head to neck and  shoulder blade.  Also has throat pain. Onset 3 days ago.  Taking IBU without relief.  Sucrets help some. Denies fever.  No Hx of this pain.  Pain is constant.  Will see tomorrow.

## 2010-07-02 NOTE — Telephone Encounter (Signed)
Given that sucrets help the pain, and ibuprofen does not, pt. may have a pharyngitis.  Agree that pt. should be seen tomorrow for an examination to confirm the diagnosis and begin therapy, if appropriate.

## 2010-07-03 ENCOUNTER — Ambulatory Visit (HOSPITAL_COMMUNITY)
Admission: RE | Admit: 2010-07-03 | Discharge: 2010-07-03 | Disposition: A | Discharge status: Home / Self Care | Payer: Self-pay | Source: Ambulatory Visit | Attending: Internal Medicine | Admitting: Internal Medicine

## 2010-07-03 ENCOUNTER — Encounter: Payer: Self-pay | Admitting: Internal Medicine

## 2010-07-03 ENCOUNTER — Ambulatory Visit (INDEPENDENT_AMBULATORY_CARE_PROVIDER_SITE_OTHER): Payer: Self-pay | Admitting: Internal Medicine

## 2010-07-03 DIAGNOSIS — M542 Cervicalgia: Secondary | ICD-10-CM

## 2010-07-03 DIAGNOSIS — E119 Type 2 diabetes mellitus without complications: Secondary | ICD-10-CM

## 2010-07-03 DIAGNOSIS — I10 Essential (primary) hypertension: Secondary | ICD-10-CM

## 2010-07-03 DIAGNOSIS — R209 Unspecified disturbances of skin sensation: Secondary | ICD-10-CM | POA: Insufficient documentation

## 2010-07-03 DIAGNOSIS — K219 Gastro-esophageal reflux disease without esophagitis: Secondary | ICD-10-CM

## 2010-07-03 DIAGNOSIS — M47812 Spondylosis without myelopathy or radiculopathy, cervical region: Secondary | ICD-10-CM | POA: Insufficient documentation

## 2010-07-03 DIAGNOSIS — E785 Hyperlipidemia, unspecified: Secondary | ICD-10-CM

## 2010-07-03 LAB — COMPREHENSIVE METABOLIC PANEL
ALT: 19 U/L (ref 0–35)
AST: 18 U/L (ref 0–37)
BUN: 19 mg/dL (ref 6–23)
Chloride: 105 mEq/L (ref 96–112)
Potassium: 4.4 mEq/L (ref 3.5–5.3)
Sodium: 141 mEq/L (ref 135–145)

## 2010-07-03 MED ORDER — CYCLOBENZAPRINE HCL 10 MG PO TABS: 10.0000 mg | ORAL_TABLET | Freq: Three times a day (TID) | ORAL | Status: AC | PRN

## 2010-07-03 MED ORDER — LISINOPRIL-HYDROCHLOROTHIAZIDE 20-25 MG PO TABS: 1.0000 | ORAL_TABLET | Freq: Every day | ORAL | Status: DC

## 2010-07-03 NOTE — Assessment & Plan Note (Signed)
Patient's blood pressure is elevated today she is only on lisinopril 5 mg, will change to increase dose of lisinopril and addition of hydrochlorothiazide given that she is diabetic and has signs of fluid overload.

## 2010-07-03 NOTE — Assessment & Plan Note (Signed)
On the past several months, no red flags noted, we'll obtain a neck x-ray and give the patient Flexeril and advised her to use cold and warm packs along with NSAIDs as needed

## 2010-07-03 NOTE — Assessment & Plan Note (Signed)
Patient's A1c today is 7.2, after discussing with the patient we will not start any other medications patient will try to watch her diet more closely and we'll reassess in 3 months the need for additional medication to control her diabetes better

## 2010-07-03 NOTE — Progress Notes (Signed)
  Subjective:    Patient ID: Debra [name], female    DOB: 1945/05/09, 65 y.o.   MRN: [id number]  HPI  This is a 65 year old female with a past medical history listed below, presents to the outpatient clinic for routine followup, today she complains of neck pain has been ongoing for the past several months she does not complaining of any trauma. Patient reports that she has had difficult past year, where her mother sister and aunt died within a short period of time, she is experiencing some stress from that. Patient denies any symptoms of weight loss, fever, chills, night sweats. Denies chest pain or shortness of breath  Review of Systems  [all other systems reviewed and are negative       Objective:   Physical Exam  [vitalsreviewed. Constitutional: She is oriented to person, place, and time. She appears well-developed and well-nourished.  HENT:  Head: Normocephalic and atraumatic.  Eyes: Pupils are equal, round, and reactive to light.  Neck: Normal range of motion. Neck supple. No JVD present. No thyromegaly present.  Cardiovascular: Normal rate and regular rhythm.   Murmur heard. Pulmonary/Chest: Effort normal and breath sounds normal. She has no wheezes. She has no rales.  Abdominal: Soft. Bowel sounds are normal.  Musculoskeletal: Normal range of motion. She exhibits edema.  Neurological: She is alert and oriented to person, place, and time.  Skin: Skin is warm and dry.          Assessment & Plan:

## 2010-08-05 NOTE — Discharge Summary (Signed)
NAME:  Debra [name], Debra [name] NO.:  [id number]   MEDICAL RECORD NO.:  [id number]          PATIENT TYPE:  INP   LOCATION:  6731                         FACILITY:  [facility]   PHYSICIAN:  Ileana Roup, M.D.  DATE OF BIRTH:  [birth year]   DATE OF ADMISSION:  11/03/2007  DATE OF DISCHARGE:  11/05/2007                               DISCHARGE SUMMARY   DISCHARGE DIAGNOSES:  1. Diverticulitis.  2. Clostridium difficile colitis.  3. Urinary tract infection.  4. Hematochezia.  5. Hypokalemia.  6. Diabetes mellitus.  7. Hyperlipidemia.  8. Hypertension.   DISCHARGE MEDICATIONS:  1. Metformin 500 mg p.o. b.i.d.  2. Calcium 500 - vitamin D - 400 one pill p.o. b.i.d.  3. Famotidine 10 mg p.o. daily.  4. Lisinopril 10 mg p.o. daily.  5. Lasix 20 mg p.o. daily.  The patient is supposed to hold this      medication until hospital followup.  6. Cipro 500 mg p.o. daily x2 days.  7. Flagyl 500 mg p.o. t.i.d. x14 days.  8. Pravachol 40 mg p.o. daily.  9. Potassium chloride 40 meq daily for 4 days   DISPOSITION AND FOLLOWUP:  I have flagged the outpatient clinic after-  hours triage and asked to have the patient set up for hospital followup  on November 08, 2007, or November 09, 2007, which is Tuesday or Wednesday  this week.  The patient needs to have a CMET to reevaluate her LFTs as  well as her potassium secondary to hypokalemia.  The patient was started  on statins, so her LFTs will need to be evaluated.  The patient's  hematochezia and abdominal pain should also be reassessed at this time.  I have ordered for an outpatient GI referral secondary to the patient's  hematochezia and not having a colonoscopy in 12 years.  Of note, the  patient will also need a CMET in 4-6 weeks to make sure that her liver  enzymes are stable.  I have given potassium chloride 40 mEq p.o. daily x  4 days secondary to the patient's potassium being consistently around 3,  which was likely secondary to her  diarrhea.  This should be assessed on  the BMET as well.   PROCEDURES PERFORMED:  1. Acute abdominal series with CXR showed no acute cardiopulmonary      findings; no plain film findings for an acute abdominal process.  2. CT of the abdomen and pelvis on November 03, 2007. CT of the abdomen      impression: cholelithiasis and stable 10-mm left adrenal nodule; no      acute findings in the abdomen. CT of the pelvis impression:      Diverticular change in the left colon.  There is subtle pericolonic      edema/inflammation associated with a diverticulum in the mid      descending colon.  This is compatible with acute diverticulitis.      The colon is diffusely fluid-filled, consistent with diarrhea.      Aside from the above described changes in the mid descending colon,  no other colonic wall thickening or colonic abnormality is evident      to suggest a diffuse colitis. Wide-mouthed ventral hernia contains      transverse colon and small bowel and is stable in appearance.  No      evidence for obstruction or incarceration related to the ventral      hernia. Increased number of lymph nodes in the ileocolic mesentery      although no individual node meets CT criteria for pathologic      enlargement. This is of indeterminate significance.   <CONSULTATIONS>  There were no consultations made.   Brief H and P are as follows:   CHIEF COMPLAINT:  Diarrhea/abdominal pain.   HISTORY OF PRESENT ILLNESS:  The patient is a 65 year old woman with  past medical history significant for diverticulitis, hemorrhoids,  diabetes mellitus, hypertension, hyperlipidemia, and morbid obesity who  presents to the ED with a complaint of diarrhea.  The patient came to  the ED on Sunday 4 days prior to admission with complaints of nausea,  vomiting, diarrhea, and was treated with an antiemetic and sent home.  The patient's nausea and vomiting resolved, but her diarrhea continued  with 10 to 15 non-bloody  bowel movements daily.  The patient also  complains of left lower quadrant abdominal pain that is cramping,  stabbing, intermittent, lasts 5 minutes, without radiation, associated  with hematochezia but not associated with fevers, chills, sick contacts,  unusual foods, travel, melena, dysuria, chest pain, headache, blurred  vision, lower extremity pain, or hematuria.   For past medical history, past surgical history, family history, social  history, home medications, and allergies please see hospital chart.   PHYSICAL EXAMINATION:  VITALS:  Temperature equals 97.5, pulse equals  89, respiratory rate equals 20, blood pressure 135/82, and O2 sat 97% on  room air.  GENERAL:  NAD.  HEENT:  AT, [place], somewhat dry mucous membranes, no erythema, no  rhinorrhea.  NECK:  Supple, no JVD.  CV:  S1 and S2, regular rate and rhythm.  No murmurs, rubs, or gallops.  LUNGS:  CTA bilaterally.  ABDOMEN:  Soft, positive bowel sounds, left upper quadrant and left  lower quadrant tenderness, NG, NR, Murphy negative.  NEUROLOGIC:  Alert and oriented x3, nonfocal.  EXTREMITIES:  Mild trace bilateral lower extremity edema.  SKIN:  Bilateral lower extremity chronic venous insufficiency changes,  no jaundice, no rash.   ADMISSION LABORATORIES:  Sodium 135, potassium 3.0, chloride 98, bicarb  22, BUN 38, creatinine 1.29, and glucose 170.  Anion gap 15, bilirubin  1.0, alkaline phosphatase 64, AST 27, ALT 27, protein 7.6, albumin 3.8,  and calcium 9.8.  WBC 7.4, hemoglobin 16.8, platelets 298, ANC 4.3, MCV  91.8, and RDW 13.9.  UA amber, cloudy, specific gravity equals 1.028, pH  equals 6.5, glucose negative, hemoglobin negative, bilirubin moderate;  ketones equals 15, positive protein equals 30, nitrite negative,  leukocyte esterase large, urine epithelials many, wbc's 11-20, rbc's 0-  2, bacteria equals many casts, hyaline not noted.   HOSPITAL COURSE BY PROBLEM:  1. Diverticulitis.  As mentioned above, the  patient's history as well      as CT abdomen and pelvis findings were consistent with      diverticulitis.  The patient was given IV fluids and put on Cipro      and Flagyl IV for treatment.  By hospital day #2, the patient had      greatly improved and reported that her stools had  slowed down and      that her abdominal pain was much better.  Of note, the patient was      found to have C. difficile colitis on hospital day #2.  As a      result, she will only receive 2 more days of ciprofloxacin      secondary to it possibly aggravating her C. difficile.  She will,      however, receive Flagyl 500 mg p.o. t.i.d. x14 days.  The patient      is stable at the time of discharge.  2. Clostridium difficile colitis.  The patient will receive 14 days of      Flagyl 500 mg p.o. t.i.d..  The patient will need reassessment of      her diarrhea to make sure this has resolved.  The patient reports      that her stools are already much better.  3. UTI.  The patient was put on Cipro and Flagyl at admission      secondary to the diverticulitis, but she had urinalysis indicative      of UTI, which should be covered by Cipro.  We will give 2 more days      of Cipro to help cover for this, but we will need to stop it      secondary to the C. difficile colitis.  The patient may warrant a      UA at hospital followup to make sure this has resolved.  4. Bright red blood per rectum.  Likely secondary to the patient's C.      difficile and diverticulitis; however, the patient does have a      history of melena and hematochezia in the past, with last reported      colonoscopy being approximately 12 years ago.  I have ordered an      outpatient GI referral, so the patient may have a colonoscopy to      assess this further.  The patient is no longer having bloody stools      at time of discharge.  5. Hypokalemia.  The patient's potassium has persistently been around      3.0 even with KCl replacement.  This is  likely secondary to her GI      losses.  I will send the patient home on KCl 40 mEq p.o. daily x4      days until her hospital followup.  This will need be reassessed at      that time.  I will restart the patient's lisinopril 10 mg p.o.      daily, which should help with her potassium.  BMET will need to be      checked at followup.  6. Diabetes mellitus.  The patient had a hemoglobin A1c checked, which      was 8.1 and definitely above goal.  I will send her home on her      metformin 500 mg p.o. b.i.d.  This should be increased to 1000 mg      p.o. b.i.d. once the patient's diarrhea has resolved.  The patient      should have hemoglobin A1c reassessed in 3 months to see if this      change will help.  7. Hyperlipidemia.  The patient's cholesterol was significantly      elevated on fasting lipid panel, with a cholesterol of 210,      triglycerides 314, HDL equals 35, and LDL equals 112.  I  will start      the patient on Pravachol 40 mg p.o. daily secondary to expense.      She will be able to get this medication at [facility] for 4 dollars.      The patient will need a CMET checked at hospital followup as well      as in 4-6 weeks to make sure her LFTs are stable.  May consider      getting a repeat fasting lipid panel within 3 months to see if her      cholesterol panel looks better.  8. Hypertension.  The patient's Lasix and lisinopril were held      secondary to diverticulitis and Clostridium difficile colitis.  I      will restart her lisinopril at the time of discharge, but I will      continue to hold her Lasix 20 mg daily until her hospital followup      in 3-4 days.  The patient's blood pressure has been well controlled      without medication secondary to her gastrointestinal losses.  May      need to consider restarting her Lasix at hospital followup.   SIGNIFICANT LABORATORIES DURING HOSPITAL STAY:  Amylase equals 37,  lipase equals 21, magnesium equals 2.2, lactic acid  equals 1.4, and  serum osmolality of 292.  TSH equals 3.361.  Serum ketones negative.  FOBT positive.  C. difficile toxin positive, 1 of 3 stools, and urine  culture shows 65,000 colonies of multiple bacterial morphotypes.   DISCHARGE LABORATORIES:  Sodium 138, potassium 3.0 prior to repletion,  chloride 107, bicarb 24, glucose 169, BUN 12, and creatinine 0.76, total  bilirubin 0.7, alkaline phosphatase 51, AST 25, ALT 24, total protein  5.8, albumin 2.9, and calcium 8.3.  WBC 5.5, hemoglobin 13.5, hematocrit  38.7, and platelets 218.   DISCHARGE VITALS:  Temperature 97.4, pulse 75, respiratory rate 20,  blood pressure 122/66, and O2 sat 98% on room air.      Rufina Falco, M.D.  Electronically Signed      Ileana Roup, M.D.  Electronically Signed    JY/MEDQ  D:  11/05/2007  T:  11/06/2007  Job:  [id number]   cc:   Blondell Reveal, MD  Outpatient Clinic

## 2010-08-08 NOTE — Discharge Summary (Signed)
NAME:  Debra [name], Debra [name] NO.:  [id number]   MEDICAL RECORD NO.:  [id number]          PATIENT TYPE:  INP   LOCATION:  5731                         FACILITY:  [facility]   PHYSICIAN:  Yetta Barre, M.D.  DATE OF BIRTH:  [birth year]   DATE OF ADMISSION:  08/26/2005  DATE OF DISCHARGE:  08/28/2005                                 DISCHARGE SUMMARY   DISCHARGE DIAGNOSES:  1.  Diverticulitis.  2.  Urinary tract infection.  3.  Diabetes mellitus.  4.  Hypertension.   DISCHARGE MEDICATIONS:  1.  Ciprofloxacin 500 mg p.o. b.i.d. until September 07, 2005.  2.  Flagyl 500 mg p.o. t.i.d. until September 07, 2005.  3.  Glucotrol XL 2.5 mg p.o. daily.  4.  Lisinopril 10 mg p.o. daily.  5.  Lasix 20 mg p.o. daily.   FOLLOWUP:  Patient is to follow up with Dr. Liliane Channel in the [facility] on September 09, 2005 at 2 p.m. to check for resolution of  symptoms.   IMAGES DONE THIS ADMISSION:  CT abdomen and pelvis with contrast, done on  August 26, 2005, showing question diverticulitis versus primary small bowel  abnormality.   PROCEDURES DONE THIS ADMISSION:  None.   CONSULTANTS THIS ADMISSION:  None.   BRIEF HISTORY AND PHYSICAL:  Debra [name] is a 64 year old woman with a past  medical history significant for diverticulitis status post  colectomy with  colostomy and revision surgery, hypertension, diabetes mellitus type 2,  hyperlipidemia, and chronic lower extremity edema, presenting to the clinic  on the morning of admission  with left lower quadrant abdominal pain,  cramping in nature, radiating to the left upper quadrant and associated with  nausea.  Patient had a CT scan while in the clinic which is positive for  diverticulitis, as given above.   ALLERGIES:  PENICILLIN (HIVES), VICODIN (NAUSEA), ASPIRIN (GI UPSET ), LATEX  (RASH).   PAST MEDICAL HISTORY:  1.  Hypertension.  2.  Diabetes.  3.  Hyperlipidemia.  4.  Lower extremity edema.  5.  History of diverticulitis  status post colectomy with colostomy reversed.  6.  History of cellulitis.  7.  History of hysterectomy.  8.  Appendectomy.  9.  Bilateral knee arthritis.   MEDICATIONS:  1.  Lasix 60 mg daily.  Patient stopped 1 month ago.  2.  Lisinopril 10 mg daily.  3.  Glucotrol XL 2..5 mg daily.  4.  Niacin 500 mg  daily.  5.  Hydrocortisone cream.   SOCIAL HISTORY:  Patient has no history of tobacco, alcohol or drug abuse.  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   FAMILY HISTORY:  Patient's mother is alive and is 67.  Has hypertension and  has a history of myocardial infarction.  Father died at 96 of a myocardial  infarction and hypertension.  Patient has 10 siblings, which 1 sister died  of a blood clot, 2 brothers died of cancer, 2 sisters have ovarian cancer.   REVIEW OF SYSTEMS:  Nausea, anorexia, frequency of urination, headache,  bruising, foul urine, fatigue,  abdominal lower quadrant pain.   PHYSICAL EXAMINATION:  Temperature 98.6.  Blood pressure 146/85.  Pulse 82.  Respiratory rate of 20.  O2 sat is 98% on room air.  In general appearance,  patient was in mild discomfort from left lower quadrant pain.  HEENT:  Extraocular movements are intact.  Pupils equal, round and reactive to  light.  Front tooth missing.  Anicteric.  Neck:  Supple.  No adenopathy, no  thyromegaly.  Lungs:  Clear to auscultation bilaterally. No wheeze or  rhonchi.  Heart:  Regular rate and rhythm.  No no murmurs, rubs or gallops.  Abdomen:  Obese,  nondistended.  Bowel sounds present.  Tenderness to  palpation in left lower quadrant and left upper quadrant.  Positive  ecchymotic lesion in left lower quadrant.  Ventral/umbilical hernia present.  Extremities:  Trace edema bilaterally.  Neurologically grossly intact.   LABS:  Sodium 138, potassium 4.0, chloride 101, bicarb 28, BUN 11,  creatinine 0.9, glucose of 171, hemoglobin 15.8, white cells 10.4, and  platelets of 293, lipase 19.   Urinalysis showed 7-10 white blood cells and 0-  2 RBC's.   BRIEF HOSPITAL COURSE:  1.  Diverticulitis as seen on CT of abdomen.  Patient was kept on      intravenous Flagyl and ciprofloxacin for 2 days and then switched over      to p.o. when she could tolerate it.  Patient was afebrile upon      admission, and there was no increase in white blood count.  Patient      showed significant improvement by the day of discharge, and therefore      was sent home with p.o. Flagyl and ciprofloxacin to be continued for a      total of 10 days.  Patient's 1 out of 2 blood cultures were positive for      gram-positive cocci in clusters.  Further culture report was still      pending.  2.  Urinary tract infection.  Patient was kept on ciprofloxacin for the      same.  Urine culture results were pending on the day of discharge.  She      needs a total of 10  days' antibiotics.  3.  Diabetes mellitus.  Patient's blood sugars were well controlled with      sliding-scale insulin and Glucotrol was also added to her regimen.  4.  Hypertension.  Patient's blood pressure was under very good control with      the Ace inhibitor.  Patient will restart on a lower dose of Lasix, that      is, 20 mg daily before discharge.  5.  Easy bruising.  Coagulable panel was within normal limits.  This is most      likely secondary to trauma.   DISCHARGE VITALS:  Temperature 98.1.  Blood pressure 132/92.  Respiratory  rate of 16.  Pulse of 70.  O2 sat is 96% on room air.   DISCHARGE LABS:  Sodium 140, potassium 3.2, chloride 103, bicarb 29, BUN 8,  creatinine 0.9, glucose of 141, hematocrit 42.3, hemoglobin 14.6, white  cells 7.4, and platelets of 260.   OF NOTE:  Patient was given KCL 40 mEq p.o. before discharge.      Yetta Barre, M.D.  Electronically Signed     SS/MEDQ  D:  09/14/2005  T:  09/14/2005  Job:  [id number]   cc:   [facility]

## 2010-08-08 NOTE — Discharge Summary (Signed)
NAME:  Debra [name], Debra [name] NO.:  [id number]   MEDICAL RECORD NO.:  [id number]          PATIENT TYPE:  INP   LOCATION:  5005                         FACILITY:  [facility]   PHYSICIAN:  Duncan Dull, M.D.     DATE OF BIRTH:  Dec 18, 1945   DATE OF ADMISSION:  11/16/2005  DATE OF DISCHARGE:  11/17/2005                                 DISCHARGE SUMMARY   DISCHARGE DIAGNOSES:  1. Cellulitis of the left ankle and calf.  2. Nausea and vomiting - resolved.  3. Type 2 diabetes.  4. Hypertension.  5. History of hyperlipidemia.  6. Chronic lower extremity edema secondary to venous insufficiency.  7. History of diverticulitis, status post partial colectomy with revision      x2.  8. Dyspepsia.   DISCHARGE MEDICATIONS:  1. Bactrim double strength 1 tab p.o. b.i.d. for 7 days.  2. Glipizide 2.5 mg p.o. q.a.m.  3. Lasix 20 mg p.o. daily, first dose on day after discharge.  4. Famotidine 20 mg p.o. b.i.d.   DISPOSITION AND FOLLOWUP:  Followup with Dr. Renae Fickle at the [facility] on Friday, August 31 at 11 a.m.  Dr. Renae Fickle should  reevaluate her cellulitis, which is located on the left lower calf and  ankle.  It is also recommended that the patient be scheduled for an  outpatient Cardiolite scan at some point in the future due to her numerous  cardiac risk factors and a history of intermittent chest pain.   PROCEDURES PERFORMED:  None.   CONSULTATIONS:  None.   BRIEF ADMITTING HISTORY AND PHYSICAL:  Debra [name] is a 65 year old female  with a history of type 2 diabetes and previous cellulitis who presented to  the emergency room complaining of worsening swelling and pain of her left  lower leg.  She first noticed this one day prior to presentation and  reported that it has been accompanied by nausea, vomiting and fever.  She  has had 2 small superficial abrasions over her left medial malleolus, which  have been present for several months.  Prior to this, the patient  reports  being in good health with no complaints.   PHYSICAL EXAM:  VITAL SIGNS:  Temperature 103.3.  Blood pressure 145/65.  Pulse 92.  Respirations 22.  Oxygen saturation 97% on room air.  GENERAL:  The patient was alert and oriented in no acute distress.  LUNGS:  Her lungs were clear to auscultation bilaterally.  HEART:  Her heart had a regular rate and rhythm without murmurs.  ABDOMEN:  Her abdomen was obese, soft, nontender, nondistended.  Normoactive  bowel sounds were presented.  LOWER EXTREMITIES:  Examination of the lower extremities revealed bilateral  edema that was more prominent on the left.  Her left distal calf and ankle  were markedly erythematous and warm to the touch.  No focal drainage was  seen.  The extent of the apparent infection preceded proximately from the  ankle about two-thirds the way up the patient's calf, primarily along the  medial aspect.  No inguinal  lymphadenopathy was noted.  NEURO EXAM:  Was grossly intact with the exception of mildly decreased  sensation in the lower extremities bilaterally.   ADMISSION LABS:  White blood cell count 14.0, hemoglobin 14.1, hematocrit  40.3, platelets 214, MCV 91.4, ANC 12.4, sodium 138, potassium 3.7, chloride  107, bicarb 22, glucose 175, BUN 14, creatinine 0.8, total bilirubin 0.7,  alkaline phosphatase 71, AST 22, ALT 18, total protein 6.2, albumin 3.6,  calcium 9.3.  Urinalysis was within normal limits with the exception of  small leukocytes.   HOSPITAL COURSE:  1. Left leg cellulitis:  At the time of admission, the patient was found      to have a markedly swollen, erythematous, and tender left distal leg.      On exam, no crepitus was noted to suggest necrotizing fascitis.      However, because of the extent of the infection and the relatively      short time in which it appeared, the patient was admitted for IV      antibiotic therapy.  She was started on cefazolin and doxycycline IV      and admitted to  the floor.  Following the initiation of antibiotic      therapy, the patient defervesced and remained afebrile.  On the date of      discharge, her leukocytosis had resolved.  The patient will be      discharged home with a 7 day course of Bactrim for empiric antibody      therapy.  She is to return to the clinic on Friday, November 20, 2005 to      be seen by Dr. Renae Fickle, who will reevaluate the cellulitis on her leg.  2. Nausea and vomiting:  patient's symptoms coincided with the development      of cellulitis in her left leg and resolved with treatment of      cellulitis.   1. Type 2 diabetes:  The patient's diabetes is well controlled on minimal      doses of glipizide (2.5 mg daily). No long term changes were made to      her home regimen.   1. Hypertension:  Because of her recent vomiting, she was not restarted on      Lasix immediately, but should start taking this again tomorrow.      Further management of her hypertension, which appears to be very well      controlled at this point, should be addressed at followup.  2. Dyspepsia:  On the evening before discharge, the patient complained of      retrosternal burning, consistent with reflux and dyspepsia.  She has      had similar symptoms in the past.  Given her numerous risk factors for      cardiovascular disease, an EKG was obtained, which revealed a normal      sinus rhythm with no acute ST changes with the exception of T-wave      inversions in leads 3 and V1.  No previous EKG is available for      comparison.  Her cardiac enzymes were cycled and did not reveal any      abnormalities.  However, given her risk of cardiovascular disease,      including diabetes, hypertension and hyperlipidemia, it may be      worthwhile for her to undergo outpatient stress testing.   DISCHARGE LABS:  White blood cell 9.4, hemoglobin 12.5, hematocrit 36.6,  platelets 204, sodium 140, potassium 3.9, chloride 108, bicarb 25, glucose 130,  BUN 11,  creatinine 0.8, calcium 8.5.   DISCHARGE VITALS:  Temperature 97.6, pulse 69, blood pressure 127/74, oxygen  saturation 94% on room air.  Capillary blood glucose 149.      Yvonne Kendall, M.D.  Electronically Signed      Duncan Dull, M.D.  Electronically Signed    CE/MEDQ  D:  11/17/2005  T:  11/17/2005  Job:  [id number]   cc:   Ellie Lunch, M.D.  Yvonne Kendall, M.D.

## 2010-08-28 ENCOUNTER — Ambulatory Visit (INDEPENDENT_AMBULATORY_CARE_PROVIDER_SITE_OTHER): Payer: Self-pay | Admitting: Internal Medicine

## 2010-08-28 VITALS — BP 160/91 | HR 78 | Temp 98.2°F | Ht 64.0 in | Wt 272.8 lb

## 2010-08-28 DIAGNOSIS — R609 Edema, unspecified: Secondary | ICD-10-CM

## 2010-08-28 DIAGNOSIS — E119 Type 2 diabetes mellitus without complications: Secondary | ICD-10-CM

## 2010-08-28 LAB — GLUCOSE, CAPILLARY: Glucose-Capillary: 199 mg/dL — ABNORMAL HIGH (ref 70–99)

## 2010-08-28 LAB — POCT GLYCOSYLATED HEMOGLOBIN (HGB A1C): Hemoglobin A1C: 8.4

## 2010-08-28 MED ORDER — POTASSIUM CHLORIDE CRYS ER 20 MEQ PO TBCR: 20.0000 meq | EXTENDED_RELEASE_TABLET | Freq: Every day | ORAL | Status: DC

## 2010-08-28 MED ORDER — FUROSEMIDE 40 MG PO TABS: 40.0000 mg | ORAL_TABLET | Freq: Two times a day (BID) | ORAL | Status: DC

## 2010-08-28 NOTE — Progress Notes (Signed)
  Subjective:    Patient ID: Debra [name], female    DOB: 19-Oct-1945, 65 y.o.   MRN: [id number]  HPI This is a 65 year old female with possible medical history significant for diabetes, hyperlipidemia, chronic leg edema who presented with significant neck pain since one week. Patient noted that her leg has been swelling and a diffuse very tight. It is worse in the evening or during the night. She also mentions some tingling in her legs. She has significant pain in both of her legs and is not able to ambulate as usual. She denies any injuries, insect bites, changes in medication. She was trying to lift up her legs which has been helping her is somehow.   Review of Systems  Constitutional: Negative for fever and chills.  Respiratory: Negative for cough and chest tightness.   Cardiovascular: Negative for chest pain.  Gastrointestinal: Positive for abdominal distention. Negative for abdominal pain, diarrhea and constipation.  Genitourinary: Negative for difficulty urinating.  Musculoskeletal: Positive for joint swelling, arthralgias and gait problem.  Neurological: Negative for dizziness.       Objective:   Physical Exam  Constitutional: She appears well-developed.  HENT:  Head: Normocephalic.  Neck: Neck supple.  Cardiovascular: Normal rate and regular rhythm.   Pulmonary/Chest: Effort normal and breath sounds normal.  Abdominal: Soft. Bowel sounds are normal.  Musculoskeletal:       Right knee: She exhibits decreased range of motion and swelling.       Left knee: She exhibits decreased range of motion and swelling.       Right ankle: She exhibits decreased range of motion and swelling.       Left ankle: She exhibits decreased range of motion and swelling.  Skin:             Assessment & Plan:

## 2010-08-28 NOTE — Assessment & Plan Note (Signed)
Patient has history of chronic leg edema. It seems to be this has been progressive getting worse. Most likely due to underlying peripheral vascular disease. I started the patient on Lasix 40 mg twice a day, advised the patient to lift her legs above  heart level, decreased salt intake and increase fluid intake and to use compression stockings on a regular basis. I will see the patient in 2 weeks. I be made should be checked at the next office visit so that continued patient's hydrochlorothiazide along with Lasix.

## 2010-09-02 ENCOUNTER — Encounter: Payer: Self-pay | Admitting: Internal Medicine

## 2010-09-11 ENCOUNTER — Encounter: Payer: Self-pay | Admitting: Internal Medicine

## 2010-09-11 ENCOUNTER — Ambulatory Visit (INDEPENDENT_AMBULATORY_CARE_PROVIDER_SITE_OTHER): Payer: Self-pay | Admitting: Internal Medicine

## 2010-09-11 ENCOUNTER — Other Ambulatory Visit: Payer: Self-pay | Admitting: Internal Medicine

## 2010-09-11 VITALS — BP 141/88 | HR 76 | Temp 98.3°F | Ht 64.0 in | Wt 274.7 lb

## 2010-09-11 DIAGNOSIS — I10 Essential (primary) hypertension: Secondary | ICD-10-CM

## 2010-09-11 DIAGNOSIS — R609 Edema, unspecified: Secondary | ICD-10-CM

## 2010-09-11 DIAGNOSIS — Z299 Encounter for prophylactic measures, unspecified: Secondary | ICD-10-CM

## 2010-09-11 DIAGNOSIS — Z1231 Encounter for screening mammogram for malignant neoplasm of breast: Secondary | ICD-10-CM

## 2010-09-11 DIAGNOSIS — E119 Type 2 diabetes mellitus without complications: Secondary | ICD-10-CM

## 2010-09-11 LAB — BASIC METABOLIC PANEL
BUN: 19 mg/dL (ref 6–23)
Chloride: 101 mEq/L (ref 96–112)
Glucose, Bld: 150 mg/dL — ABNORMAL HIGH (ref 70–99)
Potassium: 4.3 mEq/L (ref 3.5–5.3)
Sodium: 139 mEq/L (ref 135–145)

## 2010-09-11 LAB — MAGNESIUM: Magnesium: 1.4 mg/dL — ABNORMAL LOW (ref 1.5–2.5)

## 2010-09-11 MED ORDER — GLIPIZIDE 5 MG PO TABS: 5.0000 mg | ORAL_TABLET | Freq: Every day | ORAL | Status: DC

## 2010-09-11 MED ORDER — TETANUS-DIPHTH-ACELL PERTUSSIS 5-2-15.5 LF-MCG/0.5 IM SUSP
0.5000 mL | Freq: Once | INTRAMUSCULAR | Status: AC
  Administered 2010-09-11: 0.5 mL via INTRAMUSCULAR

## 2010-09-11 MED ORDER — GLIPIZIDE 5 MG PO TABS: 5.0000 mg | ORAL_TABLET | Freq: Two times a day (BID) | ORAL | Status: DC

## 2010-09-12 ENCOUNTER — Telehealth: Payer: Self-pay | Admitting: Internal Medicine

## 2010-09-12 MED ORDER — MAGNESIUM 100 MG PO TABS: 100.0000 mg | ORAL_TABLET | Freq: Two times a day (BID) | ORAL | Status: DC

## 2010-09-12 NOTE — Telephone Encounter (Signed)
I informed the patient about results of blood work and prescribed Magnesium.

## 2010-09-12 NOTE — Progress Notes (Signed)
Addended by: Almyra Deforest on: 09/12/2010 12:43 PM   Modules accepted: Orders

## 2010-09-12 NOTE — Assessment & Plan Note (Addendum)
Hemoglobin A1c is 8.4. Patient is currently on metformin causing twice a day. I was thought glipizide today for better glucose control. Advised the patient to monitor her blood sugars daily before breakfast. She should monitor herself for low blood sugars and knees always a snack handy. I'll have her come back in 2 weeks to see how she is tolerating it.

## 2010-09-12 NOTE — Assessment & Plan Note (Signed)
Patient's blood pressure is elevated. The next office visit have to discuss about changes in management.

## 2010-09-12 NOTE — Progress Notes (Signed)
  Subjective:    Patient ID: Debra [name], female    DOB: 1945/04/16, 65 y.o.   MRN: [id number]  HPI This is a 65 year old female with past medical history significant for diabetes, hyperlipidemia, hypertension, lower extremity edema who presented to the clinic for low followup. Patient was seen by me 2 weeks ago for lower extremity edema. She was started on Lasix. She noted that her spotting has significantly improved. She was also compliant with elevation of her legs on a regular basis. She noted that she started to have some open blisters on her right leg but did not notice any pus. She denies any fevers or chills. She mentioned some muscle cramps of her legs especially during the night.   Review of Systems  Constitutional: Negative for fever and chills.  Respiratory: Negative for cough, chest tightness and shortness of breath.   Cardiovascular: Positive for leg swelling. Negative for chest pain and palpitations.  Gastrointestinal: Negative for abdominal pain and abdominal distention.  Genitourinary: Negative for difficulty urinating.  Musculoskeletal: Positive for arthralgias.  Neurological: Negative for dizziness and light-headedness.       Objective:   Physical Exam  Constitutional: She is oriented to person, place, and time. She appears well-developed.  HENT:  Head: Normocephalic.  Neck: Neck supple.  Cardiovascular: Normal rate, regular rhythm and normal heart sounds.   Pulmonary/Chest: Effort normal. No respiratory distress.  Abdominal: Soft. Bowel sounds are normal. She exhibits no distension. There is no tenderness.  Musculoskeletal: Normal range of motion.       Swelling in lower extremities significantly improved compared to the last office visit.  Neurological: She is alert and oriented to person, place, and time.  Skin:             Assessment & Plan:

## 2010-09-12 NOTE — Assessment & Plan Note (Addendum)
Patient's lower extremity is significantly improved on Lasix 40 twice a day. I will repeat basic metabolic panel and check magnesium today. I encouraged the patient to continue with elevation of her legs. Lesions on the leg is not worrisome for infection at this point but I would like to monitor her closely. We'll not start any antibiotic therapy today. Therefore I will have the patient come back in 2 weeks for reevaluation.

## 2010-09-12 NOTE — Assessment & Plan Note (Signed)
#  1; patient was referred for mammogram #2; patient  received tetanus shot today.

## 2010-09-15 ENCOUNTER — Telehealth: Payer: Self-pay | Admitting: *Deleted

## 2010-09-15 NOTE — Telephone Encounter (Signed)
Message given to pt, she is agreeable

## 2010-09-15 NOTE — Telephone Encounter (Signed)
I asked that she a local ejection site reaction and that her symptoms will continue to improve with time.

## 2010-09-15 NOTE — Telephone Encounter (Signed)
Pt states at her office visit thurs 6/21 she was given dtap inj, since then she has experienced itchiness, h/a's, has been red but redness is gone and the area has a knot at the inj site, denies N&V. Please advise

## 2010-09-15 NOTE — Telephone Encounter (Signed)
Inform the patient she can use Benadryl 12.5 mg po q6 hours prn for itching.

## 2010-09-23 ENCOUNTER — Ambulatory Visit (HOSPITAL_COMMUNITY)
Admission: RE | Admit: 2010-09-23 | Discharge: 2010-09-23 | Disposition: A | Discharge status: Home / Self Care | Payer: Self-pay | Source: Ambulatory Visit | Attending: Family Medicine | Admitting: Family Medicine

## 2010-09-23 DIAGNOSIS — Z1231 Encounter for screening mammogram for malignant neoplasm of breast: Secondary | ICD-10-CM | POA: Insufficient documentation

## 2010-09-25 ENCOUNTER — Ambulatory Visit: Payer: Self-pay | Admitting: Internal Medicine

## 2010-10-07 ENCOUNTER — Encounter: Payer: Self-pay | Admitting: Internal Medicine

## 2010-10-07 ENCOUNTER — Ambulatory Visit (INDEPENDENT_AMBULATORY_CARE_PROVIDER_SITE_OTHER): Payer: Self-pay | Admitting: Internal Medicine

## 2010-10-07 DIAGNOSIS — E119 Type 2 diabetes mellitus without complications: Secondary | ICD-10-CM

## 2010-10-07 DIAGNOSIS — J029 Acute pharyngitis, unspecified: Secondary | ICD-10-CM | POA: Insufficient documentation

## 2010-10-07 LAB — GLUCOSE, CAPILLARY: Glucose-Capillary: 172 mg/dL — ABNORMAL HIGH (ref 70–99)

## 2010-10-07 MED ORDER — ACETAMINOPHEN-CODEINE 300-30 MG PO TABS: 1.0000 | ORAL_TABLET | ORAL | Status: DC | PRN

## 2010-10-07 NOTE — Assessment & Plan Note (Addendum)
Patient is currently taking metformin and Glipizid. She has not noticed any side effects from these medications. Microalbumin/Cre ratio was ordered today.

## 2010-10-07 NOTE — Progress Notes (Signed)
  Subjective:    Patient ID: Debra [name], female    DOB: [birth year], 65 y.o.   MRN: [id number]  HPI Patient is 65 YO female with PMH of DM-II, hypertension, and hyperlipidemia presents with chief complaint of sore throat and headache. Her problem started 7days ago. At beginning, she had sore throat and cough. She coughs up a teaspoon of dark yellow sputum without blood in it. Now the sputum is white in color. Three days ago, she started having headache. The headache is located at her left frontal area, 6/10 in severity, pounding, radiating to her left posterior ear area and neck. It is partially alleviated by Ibuprofen and aggravated by TV and noise environment. It is slightly getting better since it started. Patient denies fever, chills, night sweating, chest pain, SOB, palpitation, numbness or weakness in extremities, slurring speech and vision change.    Review of Systems  Constitutional: Negative for fever, chills, diaphoresis, activity change, appetite change, fatigue and unexpected weight change.  HENT: Positive for sore throat, mouth sores, trouble swallowing and neck pain. Negative for hearing loss, ear pain, nosebleeds, congestion, facial swelling, rhinorrhea, sneezing, drooling, neck stiffness, dental problem, voice change, postnasal drip, sinus pressure, tinnitus and ear discharge.   Eyes: Negative for photophobia, pain, discharge, redness, itching and visual disturbance.  Respiratory: Negative for apnea, cough, choking, chest tightness, shortness of breath, wheezing and stridor.   Cardiovascular: Negative for chest pain, palpitations and leg swelling.  Gastrointestinal: Negative for nausea, vomiting, abdominal pain, diarrhea, constipation, blood in stool, abdominal distention, anal bleeding and rectal pain.  Genitourinary: Negative.   Musculoskeletal: Negative for myalgias, back pain, joint swelling, arthralgias and gait problem.  Skin: Negative for color change, pallor, rash and wound.   Neurological: Negative for dizziness, tremors, seizures, syncope, facial asymmetry, speech difficulty, weakness, light-headedness, numbness and headaches.  Hematological: Negative.   Psychiatric/Behavioral: Negative.        Objective:   Physical Exam General: alert, well-developed, and cooperative to examination.  Head: atraumatic. Eyes: vision grossly intact, pupils equal, pupils round, pupils reactive to light, no injection and anicteric.  Mouth:Mild enlargement of right tonsil, no erythema, or exudates.  Neck: supple, full ROM, no thyromegaly, no JVD, and no carotid bruits.  Lungs: normal respiratory effort, no accessory muscle use, normal breath sounds, no crackles, and no wheezes. Heart: normal rate, regular rhythm, no murmur, no gallop, and no rub.  Abdomen: soft, non-tender, normal bowel sounds, no distention, no guarding, no rebound tenderness, no hepatomegaly, and no splenomegaly.  Msk: no joint swelling, no joint warmth, and no redness over joints.  Pulses: 2+ DP/PT pulses bilaterally Extremities: No cyanosis, clubbing, edema Neurologic: cranial nerves II-XII intact, strength normal in all extremities, sensation intact to light touch, and gait normal.  Skin: turgor normal and no rashes.          Assessment & Plan:

## 2010-10-07 NOTE — Assessment & Plan Note (Signed)
Patient's symptoms are most likely caused by viral infection. Differential diagnosis includes streptococcal pharyngitis (less likely due to absence of fever and exudate), bronchitis and atypical pneumonia (less likely because of mild symptoms, absence of fever, and negative findings in lung auscultation). Patient will be treated symptomatically. Patient is to start Tyleno/Codeine, #3 and instructed to come back if getting worse.

## 2010-10-07 NOTE — Patient Instructions (Signed)
1. Please take one tablet, four times daily when you have sore throat and cough. 2. Please come back if symptoms get worse.

## 2010-10-08 LAB — MICROALBUMIN / CREATININE URINE RATIO
Microalb Creat Ratio: 10.1 mg/g (ref 0.0–30.0)
Microalb, Ur: 1.42 mg/dL (ref 0.00–1.89)

## 2010-10-10 NOTE — Progress Notes (Signed)
Addended by: Bufford Spikes on: 10/10/2010 04:16 PM   Modules accepted: Orders

## 2010-12-05 ENCOUNTER — Telehealth: Payer: Self-pay | Admitting: *Deleted

## 2010-12-05 NOTE — Telephone Encounter (Signed)
Pt called stating she can't eat.  She thinks it might be from her metformin. Onset 1 month ago. I questioned pt and she has been taking metformin for years.  Glipizide was added to med list on  Last visit 7/17.  After taking her meds she has to force food down.  CBG's are stable.

## 2010-12-05 NOTE — Telephone Encounter (Signed)
I talked with Debra [name], diabetic educator and she does not feel either med would cause decrease in appetite.  Pt scheduled for OV  Next Tuesday for evaluation.

## 2010-12-08 ENCOUNTER — Other Ambulatory Visit: Payer: Self-pay | Admitting: Internal Medicine

## 2010-12-08 ENCOUNTER — Encounter: Payer: Self-pay | Admitting: Internal Medicine

## 2010-12-08 ENCOUNTER — Ambulatory Visit (INDEPENDENT_AMBULATORY_CARE_PROVIDER_SITE_OTHER): Payer: Self-pay | Admitting: Internal Medicine

## 2010-12-08 VITALS — BP 159/93 | HR 72 | Temp 97.3°F | Wt 274.5 lb

## 2010-12-08 DIAGNOSIS — R609 Edema, unspecified: Secondary | ICD-10-CM

## 2010-12-08 DIAGNOSIS — I10 Essential (primary) hypertension: Secondary | ICD-10-CM

## 2010-12-08 DIAGNOSIS — E119 Type 2 diabetes mellitus without complications: Secondary | ICD-10-CM

## 2010-12-08 LAB — CBC WITH DIFFERENTIAL/PLATELET
Basophils Absolute: 0 10*3/uL (ref 0.0–0.1)
Basophils Relative: 0 % (ref 0–1)
Eosinophils Absolute: 0.3 10*3/uL (ref 0.0–0.7)
HCT: 40.6 % (ref 36.0–46.0)
Hemoglobin: 13.5 g/dL (ref 12.0–15.0)
MCH: 31.8 pg (ref 26.0–34.0)
MCHC: 33.3 g/dL (ref 30.0–36.0)
Monocytes Absolute: 0.7 10*3/uL (ref 0.1–1.0)
Monocytes Relative: 9 % (ref 3–12)
Neutrophils Relative %: 53 % (ref 43–77)
RDW: 13.9 % (ref 11.5–15.5)

## 2010-12-08 LAB — MAGNESIUM: Magnesium: 1.5 mg/dL (ref 1.5–2.5)

## 2010-12-08 LAB — POCT GLYCOSYLATED HEMOGLOBIN (HGB A1C): Hemoglobin A1C: 7.4

## 2010-12-08 NOTE — Assessment & Plan Note (Signed)
1+ edema bilaterally. She's taking Lasix daily 40 mg twice a day. Encouraged continued in the same and will check electrolytes level today

## 2010-12-08 NOTE — Assessment & Plan Note (Signed)
Blood pressure mildly elevated. Continue same medications for now.

## 2010-12-08 NOTE — Patient Instructions (Signed)
Please make a follow up appointment in 10-14 days. Please taking all your medications regularly except Glipizide. I will call you with the lab test and any change in medications. If you feel better before the next appointment, postpone it to the next regular appointment.

## 2010-12-08 NOTE — Progress Notes (Signed)
  Subjective:    Patient ID: Debra [name], female    DOB: 10/11/1945, 65 y.o.   MRN: [id number]  HPI Debra [name] is a pleasant 65 year old woman with past with history of DM 2, HTN, morbid obesity who comes to the clinic with chief complaint of loss of appetite for last 2 weeks. She was at her usual state of health day for 2 weeks when she lost her appetite. She does not feel to eat. She does though try to eat as much as she can because she's diabetic and is on metformin and glipizide. She says that she is on metformin for many years and was recently started on glipizide. She was worried that glipizide might be the reason for her loss of appetite. She initially tolerated glipizide well. She stopped taking glipizide for past few days and apparently it helped improve her appetite a bit. She took one pill of glipizide yesterday night and woke up this morning with no appetite.  She denies any fever, chills, chest pain, she breath, abdominal pain, diarrhea.    Review of Systems    as per history of present illness, all other systems reviewed and negative. Objective:   Physical Exam Constitutional: Vital signs reviewed.  Patient is obese, in no acute distress and cooperative with exam. Alert and oriented x3.  Head: Normocephalic and atraumatic Mouth: no erythema or exudates, MMM Eyes: PERRL, EOMI, conjunctivae normal, No scleral icterus.  Neck: Supple, Trachea midline normal ROM Cardiovascular: RRR, S1 normal, S2 normal, no MRG Pulmonary/Chest: CTAB, no wheezes, rales, or rhonchi Abdominal: Soft. Non-tender, non-distended, bowel sounds are normal, no masses, organomegaly, or guarding present.  Musculoskeletal: No joint deformities, erythema, or stiffness, ROM full and no nontender. 1+ pedal edema bilaterally.  Neurological: A&O x3, Strenght is normal and symmetric bilaterally, cranial nerve II-XII are grossly intact, no focal motor deficit, sensory intact to light touch bilaterally.  Skin: Warm,  dry and intact. No rash, cyanosis, or clubbing.          Assessment & Plan:

## 2010-12-08 NOTE — Assessment & Plan Note (Addendum)
HbA1c today 7.4. Improved from previous reading her I will check BMP, CBC and magnesium levels today to evaluate the electrolyte levels as patient has subacute anorexia and nausea. I encouraged her to keep taking metformin and hold glipizide for now and after the results of the lab tests come back I will call her and is a little for normal I will give her a new prescription for another sulfonylurea likely a glyburide. If the electrolyte abnormal, then she has to stop taking her potassium or magnesium pills   Whichever is needed. She verbalized understanding.  12/09/2010: Lab results reviewed-no electrolyte abnormalities, CBC within normal limits. Magnesium 1.5 on lower side of normal. I called Ms. Ladona Ridgel and discuss lab results and advised her to continue taking magnesium pills. Also advised her to stop glipizide and start taking Amaryl 2 mg once a day. She would take a prescription from her pharmacy tomorrow.

## 2010-12-09 ENCOUNTER — Ambulatory Visit: Payer: Self-pay | Admitting: Internal Medicine

## 2010-12-09 LAB — BASIC METABOLIC PANEL WITH GFR
BUN: 18 mg/dL (ref 6–23)
Creat: 0.83 mg/dL (ref 0.50–1.10)
GFR, Est African American: >60 mL/min (ref >60)
GFR, Est Non African American: >60 mL/min (ref >60)
Glucose, Bld: 151 mg/dL — ABNORMAL HIGH (ref 70–99)
Potassium: 4.3 mEq/L (ref 3.5–5.3)

## 2010-12-09 MED ORDER — GLIMEPIRIDE 2 MG PO TABS: 2.0000 mg | ORAL_TABLET | Freq: Every day | ORAL | Status: DC

## 2010-12-09 NOTE — Progress Notes (Signed)
Addended by: Sunday Spillers on: 12/09/2010 04:38 PM   Modules accepted: Orders

## 2010-12-16 LAB — DIFFERENTIAL
Eosinophils Absolute: 0.1
Lymphs Abs: 2.1
Monocytes Relative: 7
Neutrophils Relative %: 74

## 2010-12-16 LAB — CBC
HCT: 42.8
MCV: 90.5
RBC: 4.73
WBC: 11.5 — ABNORMAL HIGH

## 2010-12-16 LAB — BASIC METABOLIC PANEL
Chloride: 100
Creatinine, Ser: 0.78
GFR calc Af Amer: >60
Potassium: 3.8
Sodium: 137

## 2010-12-18 ENCOUNTER — Ambulatory Visit: Payer: Self-pay | Admitting: Internal Medicine

## 2010-12-19 LAB — CLOSTRIDIUM DIFFICILE EIA: C difficile Toxins A+B, EIA: 15

## 2010-12-19 LAB — DIFFERENTIAL
Basophils Relative: 0
Eosinophils Absolute: 0
Eosinophils Relative: 2
Lymphocytes Relative: 27
Lymphs Abs: 2
Monocytes Absolute: 0.6
Monocytes Absolute: 1
Monocytes Relative: 13 — ABNORMAL HIGH
Monocytes Relative: 6

## 2010-12-19 LAB — CBC
Hemoglobin: 14.8
MCHC: 33.9
MCHC: 34.7
MCV: 90.5
MCV: 91.8
MCV: 92.5
Platelets: 298
RBC: 4.63
RBC: 4.69
RDW: 13.6
WBC: 7.4

## 2010-12-19 LAB — URINALYSIS, ROUTINE W REFLEX MICROSCOPIC
Glucose, UA: NEGATIVE
Hgb urine dipstick: NEGATIVE
Ketones, ur: 15 — AB
Nitrite: NEGATIVE
Protein, ur: 100 — AB
Specific Gravity, Urine: 1.028
Urobilinogen, UA: 0.2

## 2010-12-19 LAB — LIPID PANEL
Cholesterol: 210 — ABNORMAL HIGH
LDL Cholesterol: 112 — ABNORMAL HIGH
Total CHOL/HDL Ratio: 6
Triglycerides: 314 — ABNORMAL HIGH
VLDL: 63 — ABNORMAL HIGH

## 2010-12-19 LAB — URINE CULTURE

## 2010-12-19 LAB — PROTIME-INR: Prothrombin Time: 13.5

## 2010-12-19 LAB — COMPREHENSIVE METABOLIC PANEL
ALT: 21
AST: 27
Albumin: 3.7
Albumin: 3.8
Alkaline Phosphatase: 61
Calcium: 9.8
Chloride: 100
Chloride: 98
Creatinine, Ser: 1.29 — ABNORMAL HIGH
GFR calc Af Amer: 51 — ABNORMAL LOW
Glucose, Bld: 222 — ABNORMAL HIGH
Potassium: 3.4 — ABNORMAL LOW
Sodium: 135
Total Protein: 6.7
Total Protein: 7.6

## 2010-12-19 LAB — BASIC METABOLIC PANEL
BUN: 16
CO2: 24
Calcium: 8.4
Calcium: 8.7
Chloride: 96
Creatinine, Ser: 0.87
GFR calc Af Amer: >60
GFR calc non Af Amer: >60
Glucose, Bld: 175 — ABNORMAL HIGH
Sodium: 134 — ABNORMAL LOW

## 2010-12-19 LAB — GLUCOSE, CAPILLARY
Glucose-Capillary: 143 — ABNORMAL HIGH
Glucose-Capillary: 175 — ABNORMAL HIGH
Glucose-Capillary: 215 — ABNORMAL HIGH

## 2010-12-19 LAB — HEMOGLOBIN A1C
Hgb A1c MFr Bld: 8.1 — ABNORMAL HIGH
Mean Plasma Glucose: 211

## 2010-12-19 LAB — MAGNESIUM: Magnesium: 2.2

## 2010-12-19 LAB — URINE MICROSCOPIC-ADD ON

## 2010-12-23 LAB — SEDIMENTATION RATE: Sed Rate: 13

## 2010-12-23 LAB — DIFFERENTIAL
Basophils Relative: 1
Eosinophils Absolute: 0.2
Lymphs Abs: 2.6
Monocytes Relative: 9
Neutro Abs: 5.4
Neutrophils Relative %: 59

## 2010-12-23 LAB — URIC ACID: Uric Acid, Serum: 5.7

## 2010-12-23 LAB — CBC
MCV: 91.6
Platelets: 237
RBC: 4.38
WBC: 9.1

## 2010-12-23 LAB — BASIC METABOLIC PANEL
BUN: 14
Calcium: 9.3
Chloride: 103
Creatinine, Ser: 0.84
GFR calc Af Amer: >60

## 2010-12-25 LAB — CBC
Hemoglobin: 14.2 g/dL (ref 12.0–15.0)
Platelets: 240 10*3/uL (ref 150–400)
RDW: 14 % (ref 11.5–15.5)
WBC: 12.4 10*3/uL — ABNORMAL HIGH (ref 4.0–10.5)

## 2010-12-25 LAB — DIFFERENTIAL
Basophils Relative: 0 % (ref 0–1)
Eosinophils Absolute: 0.1 10*3/uL (ref 0.0–0.7)
Eosinophils Relative: 1 % (ref 0–5)
Monocytes Absolute: 1.1 10*3/uL — ABNORMAL HIGH (ref 0.1–1.0)
Monocytes Relative: 9 % (ref 3–12)

## 2010-12-25 LAB — COMPREHENSIVE METABOLIC PANEL
ALT: 21 U/L (ref 0–35)
Albumin: 3.8 g/dL (ref 3.5–5.2)
Alkaline Phosphatase: 68 U/L (ref 39–117)
Chloride: 98 mEq/L (ref 96–112)
Potassium: 3.4 mEq/L — ABNORMAL LOW (ref 3.5–5.1)
Sodium: 135 mEq/L (ref 135–145)
Total Bilirubin: 1.4 mg/dL — ABNORMAL HIGH (ref 0.3–1.2)
Total Protein: 7.3 g/dL (ref 6.0–8.3)

## 2010-12-25 LAB — URINALYSIS, ROUTINE W REFLEX MICROSCOPIC
Hgb urine dipstick: NEGATIVE
Ketones, ur: 15 mg/dL — AB
Protein, ur: 30 mg/dL — AB
Urobilinogen, UA: 1 mg/dL (ref 0.0–1.0)

## 2010-12-25 LAB — URINE CULTURE

## 2010-12-25 LAB — URINE MICROSCOPIC-ADD ON

## 2010-12-31 ENCOUNTER — Ambulatory Visit: Payer: Self-pay

## 2011-01-02 LAB — URINALYSIS, ROUTINE W REFLEX MICROSCOPIC
Bilirubin Urine: NEGATIVE
Glucose, UA: NEGATIVE
Ketones, ur: NEGATIVE
Specific Gravity, Urine: 1.003 — ABNORMAL LOW
pH: 7

## 2011-01-02 LAB — COMPREHENSIVE METABOLIC PANEL
ALT: 22
BUN: 10
Calcium: 9.2
Glucose, Bld: 156 — ABNORMAL HIGH
Sodium: 141
Total Protein: 6.8

## 2011-01-02 LAB — COMPREHENSIVE METABOLIC PANEL WITH GFR
AST: 23
Albumin: 3.9
Alkaline Phosphatase: 64
CO2: 26
Chloride: 104
Creatinine, Ser: 0.71
GFR calc Af Amer: >60
GFR calc non Af Amer: >60
Potassium: 3.9
Total Bilirubin: 0.7

## 2011-01-02 LAB — DIFFERENTIAL
Basophils Absolute: 0
Basophils Relative: 1
Eosinophils Absolute: 0.1
Eosinophils Relative: 2
Lymphocytes Relative: 23
Lymphs Abs: 1.9
Monocytes Absolute: 0.6
Monocytes Relative: 7
Neutro Abs: 5.5
Neutrophils Relative %: 68

## 2011-01-02 LAB — CBC
HCT: 41.4
Hemoglobin: 14.4
MCHC: 34.9
MCV: 91.6
Platelets: 254
RBC: 4.52
RDW: 13.5
WBC: 8

## 2011-01-02 LAB — LIPASE, BLOOD: Lipase: 17

## 2011-01-02 LAB — URINE MICROSCOPIC-ADD ON

## 2011-01-08 ENCOUNTER — Other Ambulatory Visit: Payer: Self-pay | Admitting: *Deleted

## 2011-01-15 ENCOUNTER — Ambulatory Visit (INDEPENDENT_AMBULATORY_CARE_PROVIDER_SITE_OTHER): Payer: Self-pay | Admitting: *Deleted

## 2011-01-15 DIAGNOSIS — Z23 Encounter for immunization: Secondary | ICD-10-CM

## 2011-01-28 ENCOUNTER — Encounter: Payer: Self-pay | Admitting: Internal Medicine

## 2011-01-28 ENCOUNTER — Ambulatory Visit (INDEPENDENT_AMBULATORY_CARE_PROVIDER_SITE_OTHER): Payer: Self-pay | Admitting: Internal Medicine

## 2011-01-28 ENCOUNTER — Other Ambulatory Visit: Payer: Self-pay | Admitting: Internal Medicine

## 2011-01-28 ENCOUNTER — Telehealth: Payer: Self-pay | Admitting: *Deleted

## 2011-01-28 DIAGNOSIS — E785 Hyperlipidemia, unspecified: Secondary | ICD-10-CM

## 2011-01-28 DIAGNOSIS — I10 Essential (primary) hypertension: Secondary | ICD-10-CM

## 2011-01-28 DIAGNOSIS — R609 Edema, unspecified: Secondary | ICD-10-CM

## 2011-01-28 DIAGNOSIS — R109 Unspecified abdominal pain: Secondary | ICD-10-CM | POA: Insufficient documentation

## 2011-01-28 LAB — URINALYSIS, ROUTINE W REFLEX MICROSCOPIC
Bilirubin Urine: NEGATIVE
Nitrite: NEGATIVE
Protein, ur: NEGATIVE mg/dL
Specific Gravity, Urine: 1.025 (ref 1.005–1.030)
Urobilinogen, UA: 0.2 mg/dL (ref 0.0–1.0)

## 2011-01-28 LAB — CBC WITH DIFFERENTIAL/PLATELET
Basophils Absolute: 0 10*3/uL (ref 0.0–0.1)
Basophils Relative: 0 % (ref 0–1)
HCT: 42 % (ref 36.0–46.0)
Hemoglobin: 14.1 g/dL (ref 12.0–15.0)
Lymphocytes Relative: 34 % (ref 12–46)
Monocytes Absolute: 0.7 10*3/uL (ref 0.1–1.0)
Neutro Abs: 5.6 10*3/uL (ref 1.7–7.7)
Neutrophils Relative %: 54 % (ref 43–77)
RDW: 13.5 % (ref 11.5–15.5)
WBC: 10.2 10*3/uL (ref 4.0–10.5)

## 2011-01-28 LAB — GLUCOSE, CAPILLARY: Glucose-Capillary: 129 mg/dL — ABNORMAL HIGH (ref 70–99)

## 2011-01-28 MED ORDER — PRAVASTATIN SODIUM 40 MG PO TABS: 40.0000 mg | ORAL_TABLET | Freq: Every day | ORAL | Status: DC

## 2011-01-28 MED ORDER — CYCLOBENZAPRINE HCL 10 MG PO TABS: 10.0000 mg | ORAL_TABLET | Freq: Three times a day (TID) | ORAL | Status: DC | PRN

## 2011-01-28 NOTE — Assessment & Plan Note (Signed)
Lipids slightly above goal, patient thought Pravastatin was expensive but was informed that it is $4 at [facility]. Since patient has not been taking we will provide her with another prescription and recheck her lipids when she is fasting in 3 months.

## 2011-01-28 NOTE — Telephone Encounter (Signed)
Pt called with c/o pain to left side below rib cage with radiation to low back.  Onset 3 days ago. Has tried IBU with some relief. Last BM today, normal. Denies any lifting or pulling in past few days.  Urine clear.   Denies N/V Increase pain when flat.  Will see today at 1:45

## 2011-01-28 NOTE — Assessment & Plan Note (Signed)
Lab Results  Component Value Date   NA 140 12/08/2010   K 4.3 12/08/2010   CL 102 12/08/2010   CO2 27 12/08/2010   BUN 18 12/08/2010   CREATININE 0.83 12/08/2010   CREATININE 0.65 04/17/2010    BP Readings from Last 3 Encounters:  01/28/11 136/82  12/08/10 159/93  10/07/10 144/87    Assessment: Hypertension control:  controlled  Progress toward goals:  at goal Barriers to meeting goals:  no barriers identified  Plan: Hypertension treatment:  continue current medications

## 2011-01-28 NOTE — Progress Notes (Signed)
  Subjective:    Patient ID: Debra [name], female    DOB: [birth year], 65 y.o.   MRN: [id number]  HPI  Debra [name] is a 65 year old woman with past medical history significant for diabetes, hyperlipidemia, morbid obesity, hypertension, history of diverticulitis who presents today for the following:  1.) Left flank pain-patient states that the pain first began 3 days ago.Patient was concerned about UTI, but denies frequency, urgency, and burning. Denies suprapubic tenderness. Started LLQ to side and radiates to back. Worse with movement and patient avoids lying on that side. Denies trauma, lifting, pulling motions. She states the pain was rather sudden in onset. Denies fevers and chills. When asked if this pain is similar to diverticulitis patient responds that it is not.  No other complaints or concerns today.  Review of Systems  Constitutional: Negative for fever, chills and appetite change.  Genitourinary: Positive for flank pain. Negative for dysuria, urgency, frequency, hematuria, vaginal bleeding, vaginal discharge, vaginal pain and pelvic pain.       Objective:   Physical Exam  Constitutional: She appears well-developed.  HENT:  Head: Normocephalic.  Cardiovascular: Normal rate.   Pulmonary/Chest: Effort normal.  Abdominal: Soft. Bowel sounds are normal. She exhibits no distension and no mass. There is no rebound and no guarding.       Left lower quadrant tenderness to palpation  Musculoskeletal:       Lumbosacral spinal pain and tenderness upon palpation No obvious masses noted           Assessment & Plan:

## 2011-01-28 NOTE — Patient Instructions (Signed)
Please followup this Friday. If you are feeling better you can cancel this appointment.  Please take Flexeril as needed for back spasms.

## 2011-01-28 NOTE — Assessment & Plan Note (Addendum)
Patient complains of left lower quadrant pain. Initially described as flank however based on physical exam this pain is actually left lower quadrant. The pain radiates to her back but not to the flank region. The back pain is sacral area. Patient denies urinary symptoms such as dysuria, urgency, frequency, vaginal discharge, vaginal bleeding, suprapubic tenderness and has no other associated symptoms such as nausea or vomiting. Furthermore the patient has history of acute diverticulitis status post colectomy and colostomy bag in the remote past. When asked if this pain is similar patient states that it is not. She denies fevers and chills or any changes in her bowel habits. Patient denies trauma, heavy lifting or pulling movements. She also denies any loss of bladder or bowel control. No other neurologic deficits reported.  Plan: -Check urinalysis and urine culture -Check CBC for elevation of white count -Defer treatment for urinary tract infection or diverticulitis as patient's symptoms are vague -Outpatient followup this Friday and consider starting ciprofloxacin if her symptoms do not improve -Flexeril for back spasms

## 2011-01-28 NOTE — Assessment & Plan Note (Signed)
Unchanged. Patient has chronic 2+ pitting edema extending to knees. Patient is on Lasix therapy and was advised to keep legs elevated. Chronic venous stasis dermatitis changes also noted.

## 2011-01-29 ENCOUNTER — Telehealth: Payer: Self-pay | Admitting: Internal Medicine

## 2011-01-29 LAB — URINALYSIS, MICROSCOPIC ONLY: Bacteria, UA: NONE SEEN

## 2011-01-29 MED ORDER — CIPROFLOXACIN HCL 500 MG PO TABS: 500.0000 mg | ORAL_TABLET | Freq: Two times a day (BID) | ORAL | Status: AC

## 2011-01-29 NOTE — Telephone Encounter (Signed)
Cipro prescribed for U/A and abdomina pain. Please refer to assessment and plan for more details.

## 2011-01-29 NOTE — Assessment & Plan Note (Signed)
Urinalysis came back which showed trace leukocytes with 7-10 WBC per high powered microscopy. CBC showed normal white count. Called patient to see how she was feeling today. Patient still with some pain that has been unchanged. Given her symptoms and urinalysis findings will prescribe patient ciprofloxacin and have her followup on Monday.

## 2011-01-30 LAB — URINE CULTURE
Colony Count: NO GROWTH
Organism ID, Bacteria: NO GROWTH

## 2011-02-02 ENCOUNTER — Encounter: Payer: Self-pay | Admitting: Internal Medicine

## 2011-02-16 ENCOUNTER — Encounter: Payer: Self-pay | Admitting: Internal Medicine

## 2011-02-16 ENCOUNTER — Ambulatory Visit (INDEPENDENT_AMBULATORY_CARE_PROVIDER_SITE_OTHER): Payer: Self-pay | Admitting: Internal Medicine

## 2011-02-16 ENCOUNTER — Telehealth: Payer: Self-pay | Admitting: *Deleted

## 2011-02-16 VITALS — BP 157/81 | HR 70 | Temp 97.0°F | Ht 64.0 in | Wt 278.2 lb

## 2011-02-16 DIAGNOSIS — R609 Edema, unspecified: Secondary | ICD-10-CM

## 2011-02-16 DIAGNOSIS — I10 Essential (primary) hypertension: Secondary | ICD-10-CM

## 2011-02-16 DIAGNOSIS — M1990 Unspecified osteoarthritis, unspecified site: Secondary | ICD-10-CM

## 2011-02-16 DIAGNOSIS — M25561 Pain in right knee: Secondary | ICD-10-CM | POA: Insufficient documentation

## 2011-02-16 DIAGNOSIS — M25569 Pain in unspecified knee: Secondary | ICD-10-CM

## 2011-02-16 DIAGNOSIS — E119 Type 2 diabetes mellitus without complications: Secondary | ICD-10-CM

## 2011-02-16 LAB — GLUCOSE, CAPILLARY: Glucose-Capillary: 105 mg/dL — ABNORMAL HIGH (ref 70–99)

## 2011-02-16 MED ORDER — FUROSEMIDE 40 MG PO TABS: 40.0000 mg | ORAL_TABLET | Freq: Every day | ORAL | Status: DC

## 2011-02-16 MED ORDER — ACETAMINOPHEN-CODEINE 300-30 MG PO TABS: 1.0000 | ORAL_TABLET | Freq: Four times a day (QID) | ORAL | Status: DC | PRN

## 2011-02-16 MED ORDER — LISINOPRIL-HYDROCHLOROTHIAZIDE 20-25 MG PO TABS: 2.0000 | ORAL_TABLET | Freq: Every day | ORAL | Status: DC

## 2011-02-16 NOTE — Assessment & Plan Note (Signed)
Patient's blood pressures elevated today however she has not been taking her recommended dose of lisinopril/hydrochlorothiazide, I have discussed this with the patient advise her to take lisinopril/hctz 20-25mg  bid. We'll recheck her blood pressure at next visit

## 2011-02-16 NOTE — Assessment & Plan Note (Signed)
Patient reports worsening of her lower extremity edema which is at 3+ pitting edema extending to her knees, this is slightly more than her baseline. This is likely due to chronic venous insufficiency. MRI did not see any further workup to rule out cardiac or renal causes. For now we'll restart the patient's Lasix and reevaluate in 2 weeks. Deferred to PCP whether they would want further workup for lower extremity edema.

## 2011-02-16 NOTE — Assessment & Plan Note (Signed)
Patient has right knee pain secondary to osteoarthritis is likely made worse by her lower extremity edema. We'll give the patient Tylenol 3 for breakthrough pain and reevaluate in 2 weeks. Note that said from noted pain physical exam was fairly benign with normal range of motion no warmth or tenderness along her joint lines.

## 2011-02-16 NOTE — Telephone Encounter (Signed)
agree

## 2011-02-16 NOTE — Telephone Encounter (Signed)
Pt called with c/o swelling in both legs.  She states where she had sores are now open and water and blood coming out. Swelling increase during the day.  She is having a hard time walking.. Onset 11/18, after starting on pravastatin.  She does take lasix. ( she is only taking once a day, rx says bid)   C/o pain to rt knee with swelling.  Denies any injury. She keeps legs elevated, no other treatment.  Will see today.

## 2011-02-16 NOTE — Patient Instructions (Signed)
Peripheral Edema You have swelling in your legs (peripheral edema). This swelling is due to excess accumulation of salt and water in your body. Edema may be a sign of heart, kidney or liver disease, or a side effect of a medication. It may also be due to problems in the leg veins. Elevating your legs and using special support stockings may be very helpful, if the cause of the swelling is due to poor venous circulation. Avoid long periods of standing, whatever the cause. Treatment of edema depends on identifying the cause. Chips, pretzels, pickles and other salty foods should be avoided. Restricting salt in your diet is almost always needed. Water pills (diuretics) are often used to remove the excess salt and water from your body via urine. These medicines prevent the kidney from reabsorbing sodium. This increases urine flow. Diuretic treatment may also result in lowering of potassium levels in your body. Potassium supplements may be needed if you have to use diuretics daily. Daily weights can help you keep track of your progress in clearing your edema. You should call your caregiver for follow up care as recommended. SEEK IMMEDIATE MEDICAL CARE IF:   You have increased swelling, pain, redness, or heat in your legs.   You develop shortness of breath, especially when lying down.   You develop chest or abdominal pain, weakness, or fainting.   You have a fever.  Document Released: 04/16/2004 Document Revised: 11/19/2010 Document Reviewed: 03/27/2009 [facility] Patient Information 2012 [place], [place].

## 2011-02-16 NOTE — Progress Notes (Signed)
  Subjective:    Patient ID: Debra [name], female    DOB: 09/29/1945, 65 y.o.   MRN: [id number]  HPI  Patient is a 65 yo WF, who is here for worsening LE edema, right knee pain exacerbation and medication refills. Patient reports compliance with her medications however on review she is only taking half of her recommended to lisinopril/hydrochlorothiazide dose, and she's not taking Lasix altogether. These 2 factors have contributed to her worsening lower extremity edema. In terms of her right knee pain it appears that the patient has chronic osteoarthritis and increase lower extremity edema is causing her pain to increase. Patient denies any other changes.   Review of Systems  Musculoskeletal: Positive for arthralgias.  All other systems reviewed and are negative.       Objective:   Physical Exam  Vitals reviewed. Constitutional: She is oriented to person, place, and time. She appears well-developed and well-nourished.  HENT:  Head: Normocephalic and atraumatic.  Eyes: Pupils are equal, round, and reactive to light.  Neck: Normal range of motion. Neck supple. No JVD present. No thyromegaly present.  Cardiovascular: Normal rate, regular rhythm and normal heart sounds.   No murmur heard. Pulmonary/Chest: Effort normal and breath sounds normal. She has no wheezes. She has no rales.  Abdominal: Soft. Bowel sounds are normal.  Musculoskeletal: Normal range of motion. She exhibits no edema.       Right ankle: She exhibits swelling.       Left ankle: She exhibits swelling.  Neurological: She is alert and oriented to person, place, and time.  Skin: Skin is warm and dry.          Assessment & Plan:

## 2011-02-17 NOTE — Progress Notes (Signed)
Refill for Lisinopril-HCTZ 20/25 mg tablets called to [facility].  Angelina Ok, RN 02/17/2011 9:42 AM

## 2011-02-24 ENCOUNTER — Telehealth: Payer: Self-pay | Admitting: *Deleted

## 2011-02-24 NOTE — Telephone Encounter (Signed)
Pt called with c/o a lot of dryness, taste like silver in mouth.  She is drinking a lot, can't get enough water.  She doesn't know what to do. Pt # [id number]  This is the message that was left.  I tried to return call but no answer at this time. Pt last seen 11/26

## 2011-02-24 NOTE — Telephone Encounter (Signed)
Will see on Wednesday

## 2011-02-24 NOTE — Telephone Encounter (Signed)
Needs appt. for eval this week.

## 2011-02-25 ENCOUNTER — Encounter: Payer: Self-pay | Admitting: Internal Medicine

## 2011-02-25 ENCOUNTER — Other Ambulatory Visit: Payer: Self-pay | Admitting: Internal Medicine

## 2011-02-25 ENCOUNTER — Ambulatory Visit (INDEPENDENT_AMBULATORY_CARE_PROVIDER_SITE_OTHER): Payer: Self-pay | Admitting: Internal Medicine

## 2011-02-25 VITALS — BP 127/80 | HR 78 | Temp 96.9°F | Ht 64.0 in | Wt 270.5 lb

## 2011-02-25 DIAGNOSIS — R609 Edema, unspecified: Secondary | ICD-10-CM

## 2011-02-25 DIAGNOSIS — I10 Essential (primary) hypertension: Secondary | ICD-10-CM

## 2011-02-25 DIAGNOSIS — K219 Gastro-esophageal reflux disease without esophagitis: Secondary | ICD-10-CM

## 2011-02-25 LAB — BASIC METABOLIC PANEL
BUN: 33 mg/dL — ABNORMAL HIGH (ref 6–23)
Chloride: 97 mEq/L (ref 96–112)
Potassium: 4 mEq/L (ref 3.5–5.3)
Sodium: 140 mEq/L (ref 135–145)

## 2011-02-25 LAB — GLUCOSE, CAPILLARY: Glucose-Capillary: 194 mg/dL — ABNORMAL HIGH (ref 70–99)

## 2011-02-25 MED ORDER — POTASSIUM CHLORIDE 10 MEQ PO TBCR: 10.0000 meq | EXTENDED_RELEASE_TABLET | Freq: Every day | ORAL | Status: DC

## 2011-02-25 NOTE — Assessment & Plan Note (Signed)
Patient describes symptoms of GERD with metallic taste in her mouth, I encouraged the patient to avoid large meals before bedtime, avoid triggers for GERD, and take proton pump inhibitors or other over-the-counter antiacids when needed, if her symptoms persist she may need to be on PPI long-term, however we will avoid this now given the long-term risks associated with PPI such as osteoporosis.

## 2011-02-25 NOTE — Progress Notes (Signed)
  Subjective:    Patient ID: Debra [name], female    DOB: 08/29/1945, 65 y.o.   MRN: [id number]  HPI  Patient is a 65 yo WF, who is here for for a 1 week followup after presenting with worsening LE edema, right knee pain exacerbation, the reason for her increased edema was that the patient was not taking her lasix and only half of her lisinopril/hctz dose. In terms of her right knee pain it appears that the patient has chronic osteoarthritis and the increase lower extremity edema was causing her pain to increase, however now this is better given that today her LE edema is improved now she is back on lasix, Patient denies any other changes, does report that she has a large meal yesterday and now has upset stomach and metallic taste in her mouth, denies any CP, SOB, fever or chills.   Patient Active Problem List  Diagnoses  . DIABETES MELLITUS, TYPE II  . HYPERLIPIDEMIA  . MORBID OBESITY  . HYPERTENSION  . DIVERTICULITIS, ACUTE  . CELLULITIS, LEG, LEFT  . OSTEOPENIA  . LEG EDEMA, CHRONIC  . Neck pain  . Preventive measure  . Abdominal pain  . Right knee pain   Current Outpatient Prescriptions on File Prior to Visit  Medication Sig Dispense Refill  . Acetaminophen-Codeine 300-30 MG per tablet Take 1-2 tablets by mouth every 6 (six) hours as needed for pain.  20 tablet  0  . Calcium Carb-Cholecalciferol (CALCIUM 500 +D) 500-400 MG-UNIT TABS Take by mouth.        . cyclobenzaprine (FLEXERIL) 10 MG tablet Take 1 tablet (10 mg total) by mouth every 8 (eight) hours as needed for muscle spasms.  30 tablet  1  . furosemide (LASIX) 40 MG tablet Take 1 tablet (40 mg total) by mouth daily.  30 tablet  0  . glimepiride (AMARYL) 2 MG tablet Take 1 tablet (2 mg total) by mouth daily before breakfast.  30 tablet  5  . lisinopril-hydrochlorothiazide (PRINZIDE,ZESTORETIC) 20-25 MG per tablet Take 2 tablets by mouth daily.  30 tablet  11  . metFORMIN (GLUCOPHAGE) 1000 MG tablet Take 1,000 mg by mouth 2  (two) times daily with a meal.        . pravastatin (PRAVACHOL) 40 MG tablet Take 1 tablet (40 mg total) by mouth daily.  30 tablet  11     Review of Systems  All other systems reviewed and are negative.       Objective:   Physical Exam  Vitals reviewed. Constitutional: She is oriented to person, place, and time. She appears well-developed and well-nourished.  HENT:  Head: Normocephalic and atraumatic.  Eyes: Pupils are equal, round, and reactive to light.  Neck: Normal range of motion. Neck supple. No JVD present. No thyromegaly present.  Cardiovascular: Normal rate, regular rhythm and normal heart sounds.   No murmur heard. Pulmonary/Chest: Effort normal and breath sounds normal. She has no wheezes. She has no rales.  Abdominal: Soft. Bowel sounds are normal.  Musculoskeletal: Normal range of motion. She exhibits edema. She exhibits no tenderness.  Neurological: She is alert and oriented to person, place, and time.  Skin: Skin is warm and dry.          Assessment & Plan:

## 2011-02-25 NOTE — Patient Instructions (Signed)
Take your potassium tablets daily

## 2011-02-25 NOTE — Assessment & Plan Note (Addendum)
Patient's lower extremity edema is improved from prior visits, continue Lasix at current dose, check a basic metabolic panel and start the patient on potassium supplementation to avoid hypokalemia. We'll reevaluate at next visit

## 2011-02-26 NOTE — Progress Notes (Signed)
Addended by: Darnelle Maffucci on: 02/26/2011 03:43 PM   Modules accepted: Orders

## 2011-03-02 ENCOUNTER — Encounter: Payer: Self-pay | Admitting: Internal Medicine

## 2011-03-04 ENCOUNTER — Ambulatory Visit (INDEPENDENT_AMBULATORY_CARE_PROVIDER_SITE_OTHER): Payer: Self-pay | Admitting: Internal Medicine

## 2011-03-04 ENCOUNTER — Encounter: Payer: Self-pay | Admitting: Internal Medicine

## 2011-03-04 VITALS — BP 142/77 | HR 76 | Temp 97.1°F | Ht 64.0 in | Wt 272.9 lb

## 2011-03-04 DIAGNOSIS — E119 Type 2 diabetes mellitus without complications: Secondary | ICD-10-CM

## 2011-03-04 DIAGNOSIS — I10 Essential (primary) hypertension: Secondary | ICD-10-CM

## 2011-03-04 DIAGNOSIS — R609 Edema, unspecified: Secondary | ICD-10-CM

## 2011-03-04 LAB — POCT GLYCOSYLATED HEMOGLOBIN (HGB A1C): Hemoglobin A1C: 7.6

## 2011-03-04 LAB — BASIC METABOLIC PANEL
CO2: 29 mEq/L (ref 19–32)
Calcium: 10.1 mg/dL (ref 8.4–10.5)
Glucose, Bld: 177 mg/dL — ABNORMAL HIGH (ref 70–99)
Potassium: 4 mEq/L (ref 3.5–5.3)
Sodium: 141 mEq/L (ref 135–145)

## 2011-03-04 NOTE — Progress Notes (Signed)
  Subjective:    Patient ID: Debra [name], female    DOB: December 20, 1945, 65 y.o.   MRN: [id number]  HPI  Patient is here for lab work in a quick visit to followup on her lower extremity edema, patient reports improvement with increased diuretic dose of Lasix. Note the patient had an elevated creatinine on last visit after being started on increased dose of Lasix that's the main reason for her visit today is to have basic metabolic panel rechecked. No other complaints  Review of Systems  All other systems reviewed and are negative.       Objective:   Physical Exam  Musculoskeletal: She exhibits edema.          Assessment & Plan:

## 2011-03-04 NOTE — Assessment & Plan Note (Signed)
A1c today is 7.6, on metformin and Amaryl alone, no changes made today given recent acute renal failure, and one-month followup we will discuss increasing the dose of Amaryl for target A1c of less than 7

## 2011-03-04 NOTE — Assessment & Plan Note (Signed)
Continues to diuresis well, continue current dose of Lasix, check basic metabolic panel today

## 2011-03-04 NOTE — Patient Instructions (Signed)
Please come back in one month

## 2011-03-27 ENCOUNTER — Encounter: Payer: Self-pay | Admitting: Internal Medicine

## 2011-05-04 ENCOUNTER — Ambulatory Visit (INDEPENDENT_AMBULATORY_CARE_PROVIDER_SITE_OTHER): Payer: Self-pay | Admitting: Internal Medicine

## 2011-05-04 ENCOUNTER — Encounter: Payer: Self-pay | Admitting: Internal Medicine

## 2011-05-04 ENCOUNTER — Telehealth: Payer: Self-pay | Admitting: *Deleted

## 2011-05-04 VITALS — BP 136/83 | HR 85 | Temp 97.1°F | Ht 64.0 in | Wt 274.6 lb

## 2011-05-04 DIAGNOSIS — R059 Cough, unspecified: Secondary | ICD-10-CM | POA: Insufficient documentation

## 2011-05-04 DIAGNOSIS — E119 Type 2 diabetes mellitus without complications: Secondary | ICD-10-CM

## 2011-05-04 DIAGNOSIS — R05 Cough: Secondary | ICD-10-CM

## 2011-05-04 DIAGNOSIS — I10 Essential (primary) hypertension: Secondary | ICD-10-CM

## 2011-05-04 DIAGNOSIS — E785 Hyperlipidemia, unspecified: Secondary | ICD-10-CM

## 2011-05-04 MED ORDER — GLIMEPIRIDE 2 MG PO TABS: 4.0000 mg | ORAL_TABLET | Freq: Every day | ORAL | Status: DC

## 2011-05-04 MED ORDER — PSEUDOEPH-BROMPHEN-DM 30-2-10 MG/5ML PO SYRP: 2.5000 mL | ORAL_SOLUTION | Freq: Four times a day (QID) | ORAL | Status: AC | PRN

## 2011-05-04 NOTE — Assessment & Plan Note (Signed)
#   DM-II: her A1C was 7.6 on 03/04/11. Will increase the dose of Amaryl from 2 mg to 4 mg daily and follow up. Lorretta Harp

## 2011-05-04 NOTE — Telephone Encounter (Signed)
Agree with plan 

## 2011-05-04 NOTE — Telephone Encounter (Signed)
Pt calls and requests appt for cough of 3wks, producing yellow sputum, states she has taken numerous otc with no relief but did bring her "sugars up". She is given appt this pm w/ dr Clyde Lundborg at 1315 per chilonb.

## 2011-05-04 NOTE — Progress Notes (Signed)
Subjective:   Patient ID: Debra [name] female   DOB: [birth year] 66 y.o.   MRN: [id number]  HPI: [name] is a 66 y.o.   Patient is 66 YO female with PMH of DM-II, hypertension, and hyperlipidemia presents for an acute visit due to cough.  Her problem started 14 days ago. At beginning, she had sore throat, running nose, body aching and cough. She coughs up a teaspoon of greenish sputum without blood in it. She took some OTC cough syrup and Robitussin without help. Her sore throat and running nose and body aching resolved gradually. But her cough persists to now. It remains the similar since it started. She does not have fever, chills and chest pain and palpitations. She has mild SOB when she has sever cough episodes. She wants to know whether she has PNA.   Denies headaches, faicial or sinus pain, abdominal pain,diarrhea, constipation, dysuria, urgency, frequency, hematuria.  Past Medical History  Diagnosis Date  . Diabetes mellitus   . Hypertension   . Hyperlipidemia   . Osteopenia     DEXA scan 7/07  . Chronic venous insufficiency   . Morbid obesity   . Diverticulitis     history of  . Leg edema     secondary to chronic venous insufficiency  . Cellulitis of left leg     history of, most recent episode 01/09   Current Outpatient Prescriptions  Medication Sig Dispense Refill  . Calcium Carb-Cholecalciferol (CALCIUM 500 +D) 500-400 MG-UNIT TABS Take by mouth.        . furosemide (LASIX) 40 MG tablet Take 1 tablet (40 mg total) by mouth daily.  30 tablet  0  . glimepiride (AMARYL) 2 MG tablet Take 2 tablets (4 mg total) by mouth daily before breakfast.  30 tablet  5  . lisinopril-hydrochlorothiazide (PRINZIDE,ZESTORETIC) 20-25 MG per tablet Take 2 tablets by mouth daily.  30 tablet  11  . metFORMIN (GLUCOPHAGE) 1000 MG tablet Take 1,000 mg by mouth 2 (two) times daily with a meal.        . potassium chloride (KLOR-CON) 10 MEQ CR tablet Take 1 tablet (10 mEq total) by mouth  daily.  30 tablet  3  . brompheniramine-pseudoephedrine-DM 30-2-10 MG/5ML syrup Take 2.5 mLs by mouth 4 (four) times daily as needed.  120 mL  2   No family history on file. History   Social History  . Marital Status: Married    Spouse Name: N/A    Number of Children: N/A  . Years of Education: N/A   Social History Main Topics  . Smoking status: Never Smoker   . Smokeless tobacco: None  . Alcohol Use: No  . Drug Use: No  . Sexually Active: None   Other Topics Concern  . None   Social History Narrative   One sister with chronic progressive disease requiring a trach, and 24/7 careOne sister with an abrupt hospitalization for a critical condition   Review of Systems: General: no fevers, chills Skin: no rash HEENT: no blurry vision, hearing changes or sore throat Pulm: mld SOB and coughing, no wheezing CV: no chest pain, palpitations, shortness of breath Abd: no nausea/vomiting, abdominal pain, diarrhea/constipation GU: no dysuria, hematuria, polyuria Ext: no arthralgias, myalgias Neuro: no weakness, numbness, or tingling  Objective:  Physical Exam: Filed Vitals:   05/04/11 1314  BP: 136/83  Pulse: 85  Temp: 97.1 F (36.2 C)  TempSrc: Oral  Height: 5\' 4"  (1.626 m)  Weight: 274 lb 9.6  oz (124.558 kg)  SpO2: 97%   General: alert, well-developed, and cooperative to examination.  Head: atraumatic. Eyes: vision grossly intact, pupils equal, pupils round, pupils reactive to light, no injection and anicteric.  Mouth:Mild enlargement of tonsil bilaterally, no erythema, or exudates.  Neck: supple, full ROM, no thyromegaly, no JVD, and no carotid bruits.  Lungs: normal respiratory effort, no accessory muscle use, normal breath sounds, no crackles, and no wheezes. Heart: normal rate, regular rhythm, no murmur, no gallop, and no rub.  Abdomen: soft, non-tender, normal bowel sounds, no distention, no guarding, no rebound tenderness, no hepatomegaly, and no splenomegaly.  Msk:  no joint swelling, no joint warmth, and no redness over joints.  Pulses: 2+ DP/PT pulses bilaterally Extremities: No cyanosis, clubbing,  Has 1+ edema in lower legs bilaterally. Neurologic: cranial nerves II-XII intact, strength normal in all extremities, sensation intact to light touch, and gait normal.  Skin: turgor normal and no rashes.   Assessment & Plan:   #. Cough: most likely due to upper respiratory viral infection. She does not fever, chills and chest pain. Her lung ausculation is clear. It is less likely to have PNA at this time. But she does have sputum. Will treat her symptomatically now with anti-tussive medication. Will recheck in 2 weeks. If still not getting better in 2 weeks, will consider to get CXR and start antibiotics.   # DM-II: her A1C was 7.6 on 03/04/11. Will increase the dose of Amaryl from 2 mg to 4 mg daily and follow up.  # HTN: her blood pressure is well controlled. bp is 136/83 today. Will continue her current regimen. Will get CMP today.  # HLD: her LDL was 118 on 04/17/10. She is on Pravastatin. She did not notice any side effects, such as muscle pain. Her AST and ALT were normal on 07/03/10. Will continue regimen.  #HM: will postpone Pneumococcal vaccine today given her coughing.    Lorretta Harp '

## 2011-05-04 NOTE — Patient Instructions (Signed)
1. Please take all medications as prescribed.  2. I increased the dose of Amaryl to 4 mg daily since your DM-II is not controlled well. 3. Please come back in 2 weeks. If your symptoms resolves in 2 weeks, please cancel the appointment. 4. If you have worsening of your symptoms or new symptoms arise, please call the clinic ([phone]), or go to the ER immediately if symptoms are severe.

## 2011-05-04 NOTE — Assessment & Plan Note (Signed)
Most likely due to upper respiratory viral infection. She does not fever, chills and chest pain. Her lung ausculation is clear. It is less likely to have PNA at this time. But she does have sputum. Will treat her symptomatically now with anti-tussive medication. Will recheck in 2 weeks. If still not getting better in 2 weeks, will consider to get CXR and start antibiotics.

## 2011-05-05 LAB — COMPLETE METABOLIC PANEL WITH GFR
AST: 23 U/L (ref 0–37)
BUN: 22 mg/dL (ref 6–23)
CO2: 27 mEq/L (ref 19–32)
Calcium: 9.9 mg/dL (ref 8.4–10.5)
Chloride: 97 mEq/L (ref 96–112)
Creat: 1.02 mg/dL (ref 0.50–1.10)
GFR, Est African American: 67 mL/min
Total Bilirubin: 0.4 mg/dL (ref 0.3–1.2)

## 2011-05-05 LAB — LIPID PANEL
Cholesterol: 227 mg/dL — ABNORMAL HIGH (ref 0–200)
HDL: 32 mg/dL — ABNORMAL LOW (ref >39)
LDL Cholesterol: 121 mg/dL — ABNORMAL HIGH (ref 0–99)
Triglycerides: 371 mg/dL — ABNORMAL HIGH (ref <150)
VLDL: 74 mg/dL — ABNORMAL HIGH (ref 0–40)

## 2011-05-05 NOTE — Progress Notes (Signed)
I saw patient and discussed her care with resident Dr. Clyde Lundborg.  I agree with the clinical findings and plans as outlined in his note.  Patient has improving cough and is afebrile with clear lung exam.  I agree with plan for symptomatic treatment and close follow up.

## 2011-05-19 ENCOUNTER — Other Ambulatory Visit: Payer: Self-pay | Admitting: Internal Medicine

## 2011-05-22 ENCOUNTER — Ambulatory Visit: Payer: Self-pay | Admitting: Internal Medicine

## 2011-05-27 ENCOUNTER — Encounter: Payer: Self-pay | Admitting: Internal Medicine

## 2011-05-27 ENCOUNTER — Telehealth: Payer: Self-pay | Admitting: *Deleted

## 2011-05-27 ENCOUNTER — Ambulatory Visit (INDEPENDENT_AMBULATORY_CARE_PROVIDER_SITE_OTHER): Payer: Self-pay | Admitting: Internal Medicine

## 2011-05-27 VITALS — BP 130/73 | HR 88 | Temp 98.3°F | Ht 64.0 in | Wt 270.9 lb

## 2011-05-27 DIAGNOSIS — E119 Type 2 diabetes mellitus without complications: Secondary | ICD-10-CM

## 2011-05-27 DIAGNOSIS — R197 Diarrhea, unspecified: Secondary | ICD-10-CM

## 2011-05-27 DIAGNOSIS — Z79899 Other long term (current) drug therapy: Secondary | ICD-10-CM

## 2011-05-27 LAB — COMPREHENSIVE METABOLIC PANEL
ALT: 34 U/L (ref 0–35)
AST: 48 U/L — ABNORMAL HIGH (ref 0–37)
Creat: 0.97 mg/dL (ref 0.50–1.10)
Sodium: 136 mEq/L (ref 135–145)
Total Bilirubin: 0.7 mg/dL (ref 0.3–1.2)
Total Protein: 6.6 g/dL (ref 6.0–8.3)

## 2011-05-27 LAB — POCT GLYCOSYLATED HEMOGLOBIN (HGB A1C): Hemoglobin A1C: 10.7

## 2011-05-27 LAB — GLUCOSE, CAPILLARY: Glucose-Capillary: 302 mg/dL — ABNORMAL HIGH (ref 70–99)

## 2011-05-27 NOTE — Progress Notes (Signed)
Subjective:     Patient ID: Debra [name], female   DOB: 1945-04-19, 66 y.o.   MRN: [id number]  HPI  Pt here for an acute visit.  States she "caught a virus" and has been having problems with diarrhea.  She began to have symptoms 2-3 days ago; she first felt nauseated and had a runny nose.  She admits to decreased appetite with one episode of emesis 2 days PTA; she has tolerated oral intake since.  She reports diarrhea over the same time period.  She states the diarrhea has a very foul odor; she experiences 3 episodes per day.  On the morning of her OV she took OTC anti-diarrheal medication and has not had any diarrhea since.  She denies any BRBPR but states her diarrhea is dark blown.   She then developed a fever; reports Tmax of 101 on the day prior to arrival.  No recent abx.  No recent travel.  She has not eaten any raw meat, unwashed vegetables, or consumed any fresh water.  She has well water at home.  No sick contacts.  She reports a sharp pain "like a pinch" in her low abdomen that occurs prior to diarrhea; she denies any other diarrhea.     Review of Systems Constitutional: Negative for fever, chills, diaphoresis, activity change, appetite change, fatigue and unexpected weight change.  HENT: Negative for hearing loss, congestion and neck stiffness.   Eyes: Negative for photophobia, pain and visual disturbance.  Respiratory: Negative for cough, chest tightness, shortness of breath and wheezing.   Cardiovascular: Negative for chest pain and palpitations.  Gastrointestinal: Negative for abdominal pain, blood in stool and anal bleeding.  Genitourinary: Negative for dysuria, hematuria and difficulty urinating.  Musculoskeletal: Negative for joint swelling.  Neurological: Negative for dizziness, syncope, speech difficulty, weakness, numbness and headaches.      Objective:   Physical Exam VItal signs reviewed and stable. GEN: No apparent distress.  Alert and oriented x 3.  Pleasant,  conversant, and cooperative to exam. HEENT: head is autraumatic and normocephalic.  Neck is supple without palpable masses or lymphadenopathy.  No JVD or carotid bruits.  Vision intact.  EOMI.  PERRLA.  Sclerae anicteric.  Conjunctivae without pallor or injection. Mucous membranes are slightly dry.  Oropharynx is without erythema, exudates, or other abnormal lesions.  Dentition is poor with numerous teeth missing. RESP:  Lungs are clear to ascultation bilaterally with good air movement.  No wheezes, ronchi, or rubs. CARDIOVASCULAR: regular rate, normal rhythm.  Clear S1, S2, no murmurs, gallops, or rubs. ABDOMEN: soft, non-tender, non-distended.  Bowels sounds present in all quadrants and normoactive.  No palpable masses. SKIN: warm and dry with normal turgor.  No rashes or abnormal lesions observed.     Assessment:

## 2011-05-27 NOTE — Telephone Encounter (Signed)
Pt calls c/o low grade fevers (100.1), h/a, weakness, abd pain, diarrhea x 2 days. Desires appt. Given per sharonb. 3/6 at 1045 dr mills

## 2011-05-27 NOTE — Assessment & Plan Note (Signed)
Patient's history and symptoms of nausea, diarrhea, and fever are highly suggestive of acute viral gastroenteritis.  She appears adequately hydrated on exam and is hemodynamically stable.  Her symptoms are improved following use of over-the-counter antidiarrheals and, per her history, it seems her temperature is trending down. Will check a comprehensive metabolic panel today to evaluate her electrolyte status.  Patient is advised to take Tylenol as needed for fever and to use over-the-counter antidiarrheal medication as directed.  Reviewed the importance of maintaining adequate hydration with water and the need to avoid sugary drinks, ie juices, to avoid worsening her diarrhea.  Also educated the patient about eating a brats diet for the next week.  She is advised to return to clinic or go to the emergency room if she develops abdominal pain, vomiting, increasing fever, worsening diarrhea, inability to tolerate anything by mouth, or other concerning symptoms.  I will call her to discuss the results of her metabolic panel; she is agreeable to admission if needed but prefers not to remain in the office for her lab results.

## 2011-05-27 NOTE — Patient Instructions (Signed)
Schedule a follow up appointment with Dr. Arvilla Market in the end of March or in May 2013.  This is for a routine follow up visit and yearly physical. If your symptoms are not better in 1 week return to the clinic for evaluation. If you develop any vomiting, abdominal pain, dizziness, passing out, or other concerning symptoms return to the clinic or go to the emergency room. Avoid drinking any juice or other drinks that have lots of sugar; these types of drinks will only make your diarrhea worse. Be sure to drink plenty of water even if this seems to cause more bowel movements. Take over-the-counter Tylenol as needed for fever. Used your over-the-counter antidiarrhea medicine as directed. Over the next week, eat things that are very easy to digest such as toast, applesauce, potatoes, rice, etc.

## 2011-05-28 ENCOUNTER — Telehealth: Payer: Self-pay | Admitting: *Deleted

## 2011-05-28 NOTE — Telephone Encounter (Signed)
Pt called back today after being seen yesterday. She can not stop the diarrhea, it's getting worst and she is having cramping. Is there anything else she can take? Pt # [id number]

## 2011-05-28 NOTE — Telephone Encounter (Signed)
Please advise pt to try Immodium if she has not already tried this.  If she is using Immodium, she may try kaopectate.  Please also encourage her to try a clear liquid diet instead of the BRAT diet we discussed yesterday.   Her electrolytes were within normal limits yesterday and I am not concerned that she is going to suffer an adverse event 2/2 her diarrhea unless it significantly worsens.  She should RTC or go to the ER if she develops profound vomiting, severe diarrhea, bloody stool, severe abdominal pain, or high fever. Thank you!

## 2011-06-03 NOTE — Telephone Encounter (Signed)
Pt states her diarrhea stopped with taking imodium.

## 2011-06-09 ENCOUNTER — Emergency Department (INDEPENDENT_AMBULATORY_CARE_PROVIDER_SITE_OTHER)
Admission: EM | Admit: 2011-06-09 | Discharge: 2011-06-09 | Disposition: A | Discharge status: Home / Self Care | Payer: Self-pay | Source: Home / Self Care | Attending: Emergency Medicine | Admitting: Emergency Medicine

## 2011-06-09 ENCOUNTER — Encounter (HOSPITAL_COMMUNITY): Payer: Self-pay | Admitting: Emergency Medicine

## 2011-06-09 DIAGNOSIS — IMO0002 Reserved for concepts with insufficient information to code with codable children: Secondary | ICD-10-CM

## 2011-06-09 MED ORDER — OXYCODONE-ACETAMINOPHEN 5-325 MG PO TABS: ORAL_TABLET | ORAL | Status: AC

## 2011-06-09 MED ORDER — SULFAMETHOXAZOLE-TMP DS 800-160 MG PO TABS: 2.0000 | ORAL_TABLET | Freq: Two times a day (BID) | ORAL | Status: AC

## 2011-06-09 MED ORDER — MUPIROCIN 2 % EX OINT: TOPICAL_OINTMENT | Freq: Three times a day (TID) | CUTANEOUS | Status: AC

## 2011-06-09 NOTE — ED Notes (Signed)
Right ring finger with pain and redness around nailbed.  Reports hang nail that was cut away last Friday.  Patient has been soaking finger in peroxide, since onset, skin has peeled away

## 2011-06-09 NOTE — ED Provider Notes (Signed)
Chief Complaint  Patient presents with  . Hand Pain    History of Present Illness:   The patient has had a five-day history of swelling of the nail fold and the right ring finger. She quit to hang nail and it got infected and there's been no collection of pus or drainage. She denies any fever or chills.  Review of Systems:  Other than noted above, the patient denies any of the following symptoms: Systemic:  No fevers, chills, sweats, or aches.  No fatigue or tiredness. Musculoskeletal:  No joint pain, arthritis, bursitis, swelling, back pain, or neck pain. Neurological:  No muscular weakness, paresthesias, headache, or trouble with speech or coordination.  No dizziness.   PMFSH:  Past medical history, family history, social history, meds, and allergies were reviewed.  Physical Exam:   Vital signs:  BP 143/86  Pulse 82  Temp(Src) 98.5 F (36.9 C) (Oral)  Resp 20  SpO2 96%  LMP 07/02/1973 Gen:  Alert and oriented times 3.  In no distress. Musculoskeletal: There is erythema, swelling, and tenderness of the nail fold the right ring finger. The skin was peeling somewhat. There was no collection of pus. No pus under the nail. All joints have full range of motion with minimal pain. Otherwise, all joints had a full a ROM with no swelling, bruising or deformity.  No edema, pulses full. Extremities were warm and pink.  Capillary refill was brisk.  Skin:  Clear, warm and dry.  No rash. Neuro:  Alert and oriented times 3.  Muscle strength was normal.  Sensation was intact to light touch.   Assessment:   Diagnoses that have been ruled out:  None  Diagnoses that are still under consideration:  None  Final diagnoses:  Paronychia    Plan:   1.  The following meds were prescribed:   New Prescriptions   MUPIROCIN OINTMENT (BACTROBAN) 2 %    Apply topically 3 (three) times daily.   OXYCODONE-ACETAMINOPHEN (PERCOCET) 5-325 MG PER TABLET    1 to 2 tablets every 6 hours as needed for pain.   SULFAMETHOXAZOLE-TRIMETHOPRIM (BACTRIM DS) 800-160 MG PER TABLET    Take 2 tablets by mouth 2 (two) times daily.   2.  The patient was instructed in symptomatic care, including rest and activity, elevation, application of ice and compression.  Appropriate handouts were given. 3.  The patient was told to return if becoming worse in any way, if no better in 3 or 4 days, and given some red flag symptoms that would indicate earlier return.   4.  The patient was told to follow up back here at the urgent care center if no improvement in 2 or 3 days or if she sees any sign of a collection of pus.   Reuben Likes, MD 06/09/11 [phone]

## 2011-06-09 NOTE — Discharge Instructions (Signed)

## 2011-06-09 NOTE — ED Notes (Signed)
Declined blanket or other comfort measures

## 2011-06-15 ENCOUNTER — Telehealth: Payer: Self-pay | Admitting: *Deleted

## 2011-06-15 ENCOUNTER — Ambulatory Visit (INDEPENDENT_AMBULATORY_CARE_PROVIDER_SITE_OTHER): Payer: Self-pay | Admitting: Internal Medicine

## 2011-06-15 VITALS — BP 141/81 | HR 95 | Temp 98.2°F | Wt 273.6 lb

## 2011-06-15 DIAGNOSIS — IMO0001 Reserved for inherently not codable concepts without codable children: Secondary | ICD-10-CM

## 2011-06-15 DIAGNOSIS — E119 Type 2 diabetes mellitus without complications: Secondary | ICD-10-CM

## 2011-06-15 DIAGNOSIS — L03019 Cellulitis of unspecified finger: Secondary | ICD-10-CM

## 2011-06-15 MED ORDER — DOXYCYCLINE HYCLATE 100 MG PO CAPS: 100.0000 mg | ORAL_CAPSULE | Freq: Two times a day (BID) | ORAL | Status: AC

## 2011-06-15 NOTE — Progress Notes (Signed)
HPI: Ms. Braaten is a 66 yo woman with PMH of DM, HTN, HLP, chronic venous insufficiency, morbid obesity presents today for follow up.  She was recently seen at an urgent care for her right ring finger swelling on 06/09/11 and was diagnosed with paronychia and was given Bactrim and Bactroban ointment.  However, she did fill her Bactrim because she is allergic to Bactrim.  Also, she not able to take Augmentin because it makes her head hurt.  She states that she was trying to pull out a piece of her cuticle and it started to bleed and swell.  She has tenderness and erythema on her 4th right finger.  She denies any fever, chills., n/v, or pus drainage.  ROS:as per HPI  PE: General: alert, well-developed, and cooperative to examination.  Lungs: normal respiratory effort, no accessory muscle use, normal breath sounds, no crackles, and no wheezes. Heart: normal rate, regular rhythm, no murmur, no gallop, and no rub.  Abdomen: soft, non-tender, normal bowel sounds, no distention, no guarding, no rebound tenderness Neurologic: nonfocal, moving all 4 extremities Skin: right 4th finger: erythema around nail bed, edema, and tenderness. No abscess or fluctuant or drainage.

## 2011-06-15 NOTE — Patient Instructions (Signed)
Start taking Doxycycline 100mg  one tablet twice daily x 14 days Follow up with primary care physician in 2 weeks if symptoms worsen or not improved

## 2011-06-15 NOTE — Assessment & Plan Note (Signed)
No abscess therefore I&D not indicated.  She was prescribed Bactrim but did not take because of intolerant side effects.  She is also not able to tolerate Augmentin because it makes her head hurt. -Will prescribe Doxycycline 100mg  po bid with meal x 14 days -Follow up with PCP in 2 weeks if not improve

## 2011-06-15 NOTE — Telephone Encounter (Signed)
Calls c/o infected R ring finger please refer to ED note 3/19, pt states finger is worse. appt per chilonb. 1545 dr Anselm Jungling

## 2011-07-13 ENCOUNTER — Encounter (HOSPITAL_COMMUNITY): Payer: Self-pay | Admitting: *Deleted

## 2011-07-13 ENCOUNTER — Emergency Department (HOSPITAL_COMMUNITY)
Admission: EM | Admit: 2011-07-13 | Discharge: 2011-07-13 | Disposition: A | Discharge status: Home / Self Care | Payer: Self-pay | Attending: Emergency Medicine | Admitting: Emergency Medicine

## 2011-07-13 DIAGNOSIS — E785 Hyperlipidemia, unspecified: Secondary | ICD-10-CM | POA: Insufficient documentation

## 2011-07-13 DIAGNOSIS — I872 Venous insufficiency (chronic) (peripheral): Secondary | ICD-10-CM | POA: Insufficient documentation

## 2011-07-13 DIAGNOSIS — I10 Essential (primary) hypertension: Secondary | ICD-10-CM | POA: Insufficient documentation

## 2011-07-13 DIAGNOSIS — E119 Type 2 diabetes mellitus without complications: Secondary | ICD-10-CM | POA: Insufficient documentation

## 2011-07-13 DIAGNOSIS — I8000 Phlebitis and thrombophlebitis of superficial vessels of unspecified lower extremity: Secondary | ICD-10-CM | POA: Insufficient documentation

## 2011-07-13 DIAGNOSIS — M79609 Pain in unspecified limb: Secondary | ICD-10-CM | POA: Insufficient documentation

## 2011-07-13 DIAGNOSIS — I809 Phlebitis and thrombophlebitis of unspecified site: Secondary | ICD-10-CM

## 2011-07-13 LAB — POCT I-STAT, CHEM 8
BUN: 19 mg/dL (ref 6–23)
Chloride: 101 mEq/L (ref 96–112)
Creatinine, Ser: 0.8 mg/dL (ref 0.50–1.10)
Hemoglobin: 13.6 g/dL (ref 12.0–15.0)
Potassium: 4.2 mEq/L (ref 3.5–5.1)
Sodium: 137 mEq/L (ref 135–145)

## 2011-07-13 MED ORDER — METFORMIN HCL 500 MG PO TABS
1000.0000 mg | ORAL_TABLET | Freq: Once | ORAL | Status: AC
  Administered 2011-07-13: 1000 mg via ORAL
  Filled 2011-07-13: qty 2

## 2011-07-13 NOTE — ED Notes (Signed)
Waiting for US

## 2011-07-13 NOTE — Discharge Instructions (Signed)
Phlebitis  Phlebitis is a redness, tenderness and soreness (inflammation) in a vein. This can occur in your arms, legs, or torso (trunk), as well as deeper inside your body.   CAUSES   Phlebitis can be triggered by multiple factors. These include:   Reduced (restricted) blood flow through your veins. This happens with prolonged bed rest, long distance travel, injury or surgery. Being overweight (obese) and pregnant can also restrict blood flow and lead to phlebitis.   Putting a catheter in the vein (intravenous or IV) and giving certain medications through in the vein (intravenously).   Cancer and cancer treatment.   Use of illegal intravenous drugs.   Inflammatory diseases.   Inherited (genetic) diseases that increase the risk for blood clots.   Hormone therapy (such as birth control pills).  SYMPTOMS    Red, tender, swollen, painful area on your skin.   Usually, the area will be long and narrow.   Low grade fever.   Significant firmness along the center of this area. This can indicate that a blood clot has formed.   Surrounding redness or a high fever, which can indicate an infection (cellulitis).  DIAGNOSIS    The appearance of your condition and your symptoms will cause your caregiver to suspect phlebitis. Usually, this is enough for a diagnosis.   Your caregiver may request blood tests or an ultrasound test of the area to be sure you do not have an infection or a blood clot. Blood tests and discussing your family history may also indicate if you have an underlying genetic disease that causes blood clots.   Occasionally, a piece of tissue is taken from the body (biopsy) if an unusual cause of phlebitis is suspected.  TREATMENT    Raise (elevate) the affected area above the level of the heart.   Apply a warm compress or heating pad for 20 minutes, 3 or 4 times a day. If you use an electric heating pad, follow the directions so you do not burn yourself.   Anti-inflammatory medications are usually  recommended. Follow your caregiver's directions.   Any IV catheter, if present, will be removed by your caregiver.   Your caregiver may prescribe medicines that kill germs (antibiotics) if an infection is present.   Your caregiver may recommend blood thinners if a blood clot is suspected or present.   Support stockings or bandages may be helpful, depending on the cause and location of the phlebitis.   Surgery may be needed to remove very damaged sections of vein, but this is rare.  HOME CARE INSTRUCTIONS    Take medications exactly as prescribed.   Follow up with your caregiver as directed.   Use support stockings or bandages if advised. These will speed healing and prevent recurrence.   If you are on blood thinners:   Do follow-up blood tests exactly as directed.   Check with your caregiver before using any new medications.   Wear a pendant to show that you are on blood thinners.   For phlebitis in the legs:   Avoid prolonged standing or bed rest.   Keep your legs moving. Raise your legs with sitting or lying.   Do not smoke.   Women, particularly those over the age of 35, should consider the risks and benefits of taking the contraceptive pill. This kind of hormone treatment can increase your risk for blood clots.  SEEK MEDICAL CARE IF:    You have unusual bruising or any bleeding problems.   Swelling   or pain in your affected arm or leg is not gradually improving.   You are on anti-inflammatory medication and you develop belly (abdominal) pain.  SEEK IMMEDIATE MEDICAL CARE IF:    An unexplained oral temperature above 100.5 F (38.1 C) develops.   You have sudden onset of chest pain or difficulty breathing.  Document Released: 03/03/2001 Document Revised: 02/26/2011 Document Reviewed: 12/03/2008  [employer] Patient Information 2012 [employer].

## 2011-07-13 NOTE — ED Notes (Signed)
Pt reports (L) leg pain, swelling, redness, and tigtness x5 days. Pt reports she had jury duty and feels [place] caused the (L) leg pain. Pt reports using warm compressions and ted hose w/no relief

## 2011-07-13 NOTE — ED Provider Notes (Signed)
Medical screening examination/treatment/procedure(s) were conducted as a shared visit with non-physician practitioner(s) and myself.  I personally evaluated the patient during the encounter   Debra [name] is a 66 y.o. female with left leg pain, and swelling since yesterday, no trauma. No shortness of breath, chest pain at this time. She is localized left lower leg swelling. She is obese. Doppler left leg shows superficial thrombophlebitis without DVT component.  She is at risk for progression of venous disease to a DVT. She will need to be followed closely.   Medical screening examination/treatment/procedure(s) were conducted as a shared visit with non-physician practitioner(s) and myself.  I personally evaluated the patient during the encounter  Flint Melter, MD 07/14/11 [phone]

## 2011-07-13 NOTE — Progress Notes (Signed)
VASCULAR LAB PRELIMINARY  PRELIMINARY  PRELIMINARY  PRELIMINARY  Left lower extremity venous Doppler completed.    Preliminary report:  There is no DVT noted in the left lower extremity.  There is extensive superficial thrombosis noted in the left Greater Saphenous Vein, beginning in the ankle and extending to approximately 3 inches from the confluence with the common femoral vein.  Sherren Kerns [place], 07/13/2011, 8:14 AM

## 2011-07-13 NOTE — ED Provider Notes (Signed)
Medical screening examination/treatment/procedure(s) were performed by non-physician practitioner and as supervising physician I was immediately available for consultation/collaboration.  Jeannia Tatro K Yahshua Thibault-Rasch, MD 07/13/11 0948 

## 2011-07-13 NOTE — ED Notes (Signed)
Patient transported to Ultrasound 

## 2011-07-13 NOTE — ED Provider Notes (Signed)
History     CSN: [id number]  Arrival date & time 07/13/11  4098   First MD Initiated Contact with Patient 07/13/11 [phone]      Chief Complaint  Patient presents with  . Leg Pain    (Consider location/radiation/quality/duration/timing/severity/associated sxs/prior treatment) HPI  Pt presents to the ED with complaints of left leg pain. She states that last week she did a lot of walking and then woke up this morning with her leg hurting, cramping, burning and aching. The patient denies injury. She admits to a family history of DVT, her sister who died from it. She denies chest pain, shortness of breath, abdominal pain, palpitations or any other associated symptoms. She denies being on blood thinners. The locations is the left medial side of her calf, the pain is moderate to severe.    Past Medical History  Diagnosis Date  . Diabetes mellitus   . Hypertension   . Hyperlipidemia   . Osteopenia     DEXA scan 7/07  . Chronic venous insufficiency   . Morbid obesity   . Diverticulitis     history of  . Leg edema     secondary to chronic venous insufficiency  . Cellulitis of left leg     history of, most recent episode 01/09    Past Surgical History  Procedure Date  . Appendectomy   . Abdominal hysterectomy   . Colectomy     with diverting colsotmy and revision in the 1990's for diverticulitis    History reviewed. No pertinent family history.  History  Substance Use Topics  . Smoking status: Never Smoker   . Smokeless tobacco: Not on file  . Alcohol Use: No    OB History    Grav Para Term Preterm Abortions TAB SAB Ect Mult Living                  Review of Systems   HEENT: denies blurry vision or change in hearing PULMONARY: Denies difficulty breathing and SOB, wheezing CARDIAC: denies chest pain or heart palpitations, chest pressure MUSCULOSKELETAL:  denies being unable to ambulate or change in gait ABDOMEN AL: denies abdominal pain, N/V/D GU: denies loss of  bowel or urinary control NEURO: denies numbness and tingling in extremities VASCULAR: feeling of cold in her lower extremities   Allergies  Aspirin; Hydrocodone; Penicillins; and Latex  Home Medications   Current Outpatient Rx  Name Route Sig Dispense Refill  . FUROSEMIDE 40 MG PO TABS Oral Take 1 tablet (40 mg total) by mouth daily. 30 tablet 0  . METFORMIN HCL 1000 MG PO TABS  TAKE 1 TABLET(S) BY MOUTH TWICE DAILY 60 tablet 6    Rx has expired - unused refills remain    BP 153/85  Pulse 73  Temp(Src) 97.6 F (36.4 C) (Oral)  Resp 16  SpO2 97%  LMP 07/02/1973  Physical Exam  Nursing note and vitals reviewed. Constitutional: She appears well-developed and well-nourished. No distress.  HENT:  Head: Normocephalic and atraumatic.  Eyes: Pupils are equal, round, and reactive to light.  Neck: Normal range of motion. Neck supple.  Cardiovascular: Normal rate and regular rhythm.   Pulmonary/Chest: Effort normal.  Abdominal: Soft.  Musculoskeletal:       Left lower leg: She exhibits swelling (bilateral swelling over lower extremities).       Legs: Neurological: She is alert.  Skin: Skin is warm and dry.    ED Course  Procedures (including critical care time)  Labs Reviewed -  No data to display No results found.   No diagnosis found.    MDM   [place] sound shows superficial thrombophlebitis.  Pt has good primary care follow-up. She states that she will call and see her doctor this week. She also has compression stalking's at home and voices understanding with plan that she needs to use them and also use warm compresses over the area as much as possible. Pt can take Ibuprofen for the pain. I have made the patient aware that this dz can predispose her to DVTs therefore it is crucial she has close follow-up with her PCP.  Pt has been advised of the symptoms that warrant their return to the ED. Patient has voiced understanding and has agreed to follow-up with the PCP or  specialist.         Dorthula Matas, PA 07/13/11 [phone]

## 2011-07-13 NOTE — ED Notes (Signed)
Pt states that yesterday her leg was hurting and today she woke up with cramping, burning and achyness in both legs. Pt states that her "veins are hurting" when asked pt points to area that appear bruised red and swollen. Pt ambulatory.

## 2011-07-13 NOTE — ED Notes (Signed)
Assisted to [facility]

## 2011-07-13 NOTE — ED Notes (Signed)
Left leg pain and causing cramps, started couple days ago and cont getting worst. Use ted hose and warm compression it did not get better. Left leg is swollen tender to touch and warm to touch.

## 2011-07-15 ENCOUNTER — Ambulatory Visit (INDEPENDENT_AMBULATORY_CARE_PROVIDER_SITE_OTHER): Payer: Self-pay | Admitting: Internal Medicine

## 2011-07-15 ENCOUNTER — Encounter: Payer: Self-pay | Admitting: Internal Medicine

## 2011-07-15 VITALS — BP 175/88 | HR 88 | Temp 98.3°F | Wt 256.0 lb

## 2011-07-15 DIAGNOSIS — I8000 Phlebitis and thrombophlebitis of superficial vessels of unspecified lower extremity: Secondary | ICD-10-CM

## 2011-07-15 DIAGNOSIS — I8002 Phlebitis and thrombophlebitis of superficial vessels of left lower extremity: Secondary | ICD-10-CM

## 2011-07-15 DIAGNOSIS — R609 Edema, unspecified: Secondary | ICD-10-CM

## 2011-07-15 MED ORDER — FUROSEMIDE 40 MG PO TABS: 40.0000 mg | ORAL_TABLET | Freq: Two times a day (BID) | ORAL | Status: DC

## 2011-07-15 NOTE — Patient Instructions (Signed)
Phlebitis Phlebitis is a redness, tenderness and soreness (inflammation) in a vein. This can occur in your arms, legs, or torso (trunk), as well as deeper inside your body. This condition results in redness, tenderness, and firmness along the course of the vein. This problem is most often caused by local injury, IV drugs or medication, or prolonged bed rest. You should rest and keep the affected area elevated until your symptoms get better. Put warm moist packs on the inflamed vein for 20 minutes, 3 to 4 times every day. Medicines to reduce inflammation and pain will also speed recovery.  SEEK MEDICAL CARE IF:   You have unusual bruising or any bleeding problems.   Swelling or pain in your affected arm or leg is not gradually improving.   You are on anti-inflammatory medication and you develop belly (abdominal) pain.  SEEK IMMEDIATE MEDICAL CARE IF:   You have an oral temperature above 102 F (38.9 C), not controlled by medicine.   You have sudden onset of chest pain or difficulty breathing.  Document Released: 04/16/2004 Document Revised: 02/26/2011 Document Reviewed: 12/04/2008 [facility] Patient Information 2012 [place], [place].

## 2011-07-15 NOTE — Progress Notes (Signed)
Patient ID: Debra [name], female   DOB: 08-Aug-1945, 66 y.o.   MRN: [id number]  66 y/o w with pmh listed below comes for ER eval Was seen there 4/22 for 1 week h/o left leg pain and was Dx was superficial venous thrombosis of left lowed ext.  No DVT or thrombosis of Rt ext She has been ambulatory, using heating pads and ibuprofen  Physical exam

## 2011-07-15 NOTE — Assessment & Plan Note (Signed)
Continue conservative treatment with ibuprofen, heating pads, ambulation, compression stockings Repeat venous Dopplers to rule out DVT Followup in 1-2 weeks

## 2011-07-15 NOTE — Progress Notes (Signed)
Patient ID: Debra [name], female   DOB: March 12, 1946, 66 y.o.   MRN: [id number]  Woman with past medical history of hypertension and diabetes comes to the clinic for ER follow. Was seen there 2 days ago for ongoing pain in the left leg for a week. Venous Dopplers showed superficial venous thrombophlebitis and conservative treatment was recommended Patient is compliant with ibuprofen, ambulation, heat pads. She has not started using the compression stocking yet. She has noticed some increased swelling of her thighs which was not present 2 days ago. She denies any worsening swelling of the cough, pain, shortness of breath or hemoptysis.  Physical exam  General Appearance:     Filed Vitals:   07/15/11 1321  BP: 175/88  Pulse: 88  Temp: 98.3 F (36.8 C)  TempSrc: Oral  Weight: 256 lb (116.121 kg)     Alert, cooperative, no distress, appears stated age  Head:    Normocephalic, without obvious abnormality, atraumatic  Eyes:    PERRL, conjunctiva/corneas clear, EOM's intact, fundi    benign, both eyes       Neck:   Supple, symmetrical, trachea midline, no adenopathy;       thyroid:  No enlargement/tenderness/nodules; no carotid   bruit or JVD  Lungs:     Clear to auscultation bilaterally, respirations unlabored  Chest wall:    No tenderness or deformity  Heart:    Regular rate and rhythm, S1 and S2 normal, no murmur, rub   or gallop  Abdomen:     Soft, non-tender, bowel sounds active all four quadrants,    no masses, no organomegaly  Extremities:  varicose veins bilaterally lower extremities, tender swollen veins of left cough and thigh area. Bilateral 1-2+ edema   Pulses:   2+ and symmetric all extremities  Skin:   Skin color, texture, turgor normal, no rashes or lesions  Neurologic:  nonfocal grossly   Review of system- as per history of present illness

## 2011-07-16 ENCOUNTER — Ambulatory Visit (HOSPITAL_COMMUNITY)
Admission: RE | Admit: 2011-07-16 | Discharge: 2011-07-16 | Disposition: A | Discharge status: Home / Self Care | Payer: Self-pay | Source: Ambulatory Visit | Attending: Internal Medicine | Admitting: Internal Medicine

## 2011-07-16 DIAGNOSIS — R609 Edema, unspecified: Secondary | ICD-10-CM

## 2011-07-16 DIAGNOSIS — R600 Localized edema: Secondary | ICD-10-CM

## 2011-07-16 DIAGNOSIS — M79609 Pain in unspecified limb: Secondary | ICD-10-CM

## 2011-07-16 DIAGNOSIS — M7989 Other specified soft tissue disorders: Secondary | ICD-10-CM | POA: Insufficient documentation

## 2011-07-16 NOTE — Progress Notes (Signed)
Bilateral lower extremity venous duplex completed at 09:53.  Preliminary report is negative for DVT bilaterally.  There appears to be superficial thrombus in the left greater saphenous vein.

## 2011-07-20 ENCOUNTER — Encounter: Payer: Self-pay | Admitting: Internal Medicine

## 2011-07-29 ENCOUNTER — Encounter: Payer: Self-pay | Admitting: Internal Medicine

## 2011-10-22 HISTORY — PX: EYE SURGERY: SHX253

## 2011-10-23 ENCOUNTER — Encounter: Payer: Self-pay | Admitting: Internal Medicine

## 2011-11-04 ENCOUNTER — Encounter (INDEPENDENT_AMBULATORY_CARE_PROVIDER_SITE_OTHER): Payer: Medicare Other | Admitting: Ophthalmology

## 2011-11-04 DIAGNOSIS — H33309 Unspecified retinal break, unspecified eye: Secondary | ICD-10-CM

## 2011-11-04 DIAGNOSIS — E11319 Type 2 diabetes mellitus with unspecified diabetic retinopathy without macular edema: Secondary | ICD-10-CM

## 2011-11-04 DIAGNOSIS — H35039 Hypertensive retinopathy, unspecified eye: Secondary | ICD-10-CM

## 2011-11-04 DIAGNOSIS — I10 Essential (primary) hypertension: Secondary | ICD-10-CM

## 2011-11-04 DIAGNOSIS — H43819 Vitreous degeneration, unspecified eye: Secondary | ICD-10-CM

## 2011-11-04 DIAGNOSIS — E1139 Type 2 diabetes mellitus with other diabetic ophthalmic complication: Secondary | ICD-10-CM

## 2011-11-11 ENCOUNTER — Ambulatory Visit (INDEPENDENT_AMBULATORY_CARE_PROVIDER_SITE_OTHER): Payer: Medicare Other | Admitting: Ophthalmology

## 2011-11-11 DIAGNOSIS — H33309 Unspecified retinal break, unspecified eye: Secondary | ICD-10-CM

## 2011-12-16 ENCOUNTER — Ambulatory Visit (INDEPENDENT_AMBULATORY_CARE_PROVIDER_SITE_OTHER): Payer: Medicare Other | Admitting: Internal Medicine

## 2011-12-16 ENCOUNTER — Encounter: Payer: Self-pay | Admitting: Internal Medicine

## 2011-12-16 VITALS — BP 150/77 | HR 69 | Temp 98.3°F | Ht 64.0 in | Wt 259.7 lb

## 2011-12-16 DIAGNOSIS — I10 Essential (primary) hypertension: Secondary | ICD-10-CM

## 2011-12-16 DIAGNOSIS — Z23 Encounter for immunization: Secondary | ICD-10-CM

## 2011-12-16 DIAGNOSIS — E119 Type 2 diabetes mellitus without complications: Secondary | ICD-10-CM

## 2011-12-16 LAB — GLUCOSE, CAPILLARY: Glucose-Capillary: 234 mg/dL — ABNORMAL HIGH (ref 70–99)

## 2011-12-16 MED ORDER — LISINOPRIL 10 MG PO TABS: 10.0000 mg | ORAL_TABLET | Freq: Every day | ORAL | Status: DC

## 2011-12-16 MED ORDER — GABAPENTIN 300 MG PO CAPS: 300.0000 mg | ORAL_CAPSULE | Freq: Three times a day (TID) | ORAL | Status: DC

## 2011-12-16 MED ORDER — LISINOPRIL 20 MG PO TABS: 20.0000 mg | ORAL_TABLET | Freq: Every day | ORAL | Status: DC

## 2011-12-16 MED ORDER — GLIPIZIDE 5 MG PO TABS: 5.0000 mg | ORAL_TABLET | Freq: Every day | ORAL | Status: DC

## 2011-12-16 NOTE — Patient Instructions (Signed)
Continue to take the Metformin 100mg  2 times a day. Start taking the glipizide 5 mg a day. Check you blood sugars at least 2 times a day and record theses readings to bring to your next appointment in 2 weeks.  Take Neurontin 300mg  three times a day for the burning pain in your feet.  Try to keep a food diary over the next week (or two if possible) to help you see what and how much you are eating.  Continue with the Lasix. Start Lisinopril 20mg  daily for your blood pressure.  Follow up with me in 2 weeks for a check of your blood pressure and blood sugar levels. We will also check your electrolytes and cholesterol at that visit.    Diabetes Meal Planning Guide The diabetes meal planning guide is a tool to help you plan your meals and snacks. It is important for people with diabetes to manage their blood glucose (sugar) levels. Choosing the right foods and the right amounts throughout your day will help control your blood glucose. Eating right can even help you improve your blood pressure and reach or maintain a healthy weight. CARBOHYDRATE COUNTING MADE EASY When you eat carbohydrates, they turn to sugar. This raises your blood glucose level. Counting carbohydrates can help you control this level so you feel better. When you plan your meals by counting carbohydrates, you can have more flexibility in what you eat and balance your medicine with your food intake. Carbohydrate counting simply means adding up the total amount of carbohydrate grams in your meals and snacks. Try to eat about the same amount at each meal. Foods with carbohydrates are listed below. Each portion below is 1 carbohydrate serving or 15 grams of carbohydrates. Ask your dietician how many grams of carbohydrates you should eat at each meal or snack. Grains and Starches  1 slice bread.    English muffin or hotdog/hamburger bun.    cup cold cereal (unsweetened).   ? cup cooked pasta or rice.    cup starchy vegetables  (corn, potatoes, peas, beans, winter squash).   1 tortilla (6 inches).    bagel.   1 waffle or pancake (size of a CD).    cup cooked cereal.   4 to 6 small crackers.  *Whole grain is recommended. Fruit  1 cup fresh unsweetened berries, melon, papaya, pineapple.   1 small fresh fruit.    banana or mango.    cup fruit juice (4 oz unsweetened).    cup canned fruit in natural juice or water.   2 tbs dried fruit.   12 to 15 grapes or cherries.  Milk and Yogurt  1 cup fat-free or 1% milk.   1 cup soy milk.   6 oz light yogurt with sugar-free sweetener.   6 oz low-fat soy yogurt.   6 oz plain yogurt.  Vegetables  1 cup raw or  cup cooked is counted as 0 carbohydrates or a "free" food.   If you eat 3 or more servings at 1 meal, count them as 1 carbohydrate serving.  Other Carbohydrates   oz chips or pretzels.    cup ice cream or frozen yogurt.    cup sherbet or sorbet.   2 inch square cake, no frosting.   1 tbs honey, sugar, jam, jelly, or syrup.   2 small cookies.   3 squares of graham crackers.   3 cups popcorn.   6 crackers.   1 cup broth-based soup.   Count 1 cup  casserole or other mixed foods as 2 carbohydrate servings.   Foods with less than 20 calories in a serving may be counted as 0 carbohydrates or a "free" food.  You may want to purchase a book or computer software that lists the carbohydrate gram counts of different foods. In addition, the nutrition facts panel on the labels of the foods you eat are a good source of this information. The label will tell you how big the serving size is and the total number of carbohydrate grams you will be eating per serving. Divide this number by 15 to obtain the number of carbohydrate servings in a portion. Remember, 1 carbohydrate serving equals 15 grams of carbohydrate. SERVING SIZES Measuring foods and serving sizes helps you make sure you are getting the right amount of food. The list below tells  how big or small some common serving sizes are.  1 oz.........4 stacked dice.   3 oz........Marland KitchenDeck of cards.   1 tsp.......Marland KitchenTip of little finger.   1 tbs......Marland KitchenMarland KitchenThumb.   2 tbs.......Marland KitchenGolf ball.    cup......Marland KitchenHalf of a fist.   1 cup.......Marland KitchenA fist.  SAMPLE DIABETES MEAL PLAN Below is a sample meal plan that includes foods from the grain and starches, dairy, vegetable, fruit, and meat groups. A dietician can individualize a meal plan to fit your calorie needs and tell you the number of servings needed from each food group. However, controlling the total amount of carbohydrates in your meal or snack is more important than making sure you include all of the food groups at every meal. You may interchange carbohydrate containing foods (dairy, starches, and fruits). The meal plan below is an example of a 2000 calorie diet using carbohydrate counting. This meal plan has 17 carbohydrate servings. Breakfast  1 cup oatmeal (2 carb servings).    cup light yogurt (1 carb serving).   1 cup blueberries (1 carb serving).    cup almonds.  Snack  1 large apple (2 carb servings).   1 low-fat string cheese stick.  Lunch  Chicken breast salad.   1 cup spinach.    cup chopped tomatoes.   2 oz chicken breast, sliced.   2 tbs low-fat [place] dressing.   12 whole-wheat crackers (2 carb servings).   12 to 15 grapes (1 carb serving).   1 cup low-fat milk (1 carb serving).  Snack  1 cup carrots.    cup hummus (1 carb serving).  Dinner  3 oz broiled salmon.   1 cup brown rice (3 carb servings).  Snack  1  cups steamed broccoli (1 carb serving) drizzled with 1 tsp olive oil and lemon juice.   1 cup light pudding (2 carb servings).  DIABETES MEAL PLANNING WORKSHEET Your dietician can use this worksheet to help you decide how many servings of foods and what types of foods are right for you.   BREAKFAST Food Group and Servings / Carb Servings Grain/Starches  __________________________________ Dairy __________________________________________ Vegetable ______________________________________ Fruit ___________________________________________ Meat __________________________________________ Fat ____________________________________________ LUNCH Food Group and Servings / Carb Servings Grain/Starches ___________________________________ Dairy ___________________________________________ Fruit ____________________________________________ Meat ___________________________________________ Fat _____________________________________________ [place] Food Group and Servings / Carb Servings Grain/Starches ___________________________________ Dairy ___________________________________________ Fruit ____________________________________________ Meat ___________________________________________ Fat _____________________________________________ SNACKS Food Group and Servings / Carb Servings Grain/Starches ___________________________________ Dairy ___________________________________________ Vegetable _______________________________________ Fruit ____________________________________________ Meat ___________________________________________ Fat _____________________________________________ DAILY TOTALS Starches _________________________ Vegetable ________________________ Fruit ____________________________ Dairy ____________________________ Meat ____________________________ Fat ______________________________ Document Released: 12/04/2004 Document Revised: 02/26/2011 Document Reviewed: 10/15/2008 [employer] Patient Information 2012 [place], [place].

## 2011-12-16 NOTE — Progress Notes (Signed)
Patient ID: Debra [name], female   DOB: [birth year], 66 y.o.   MRN: [id number]  Subjective:   Patient ID: Debra [name] female   DOB: Jun 24, 1945 66 y.o.   MRN: [id number]  HPI: [name] is a 66 y.o. female  Endorses elevated blood glucose levels over the past month, esp over the past week w/ CBGs over 200. She says that her diet could be better- eating sweats daily, and eats rice and pasta. She endorses compliance with her medications, and checks her blood glucose qam and qhs. She and her husband are using the same meter. She endorses a burning pain in her feet that feels like glass and is worse when she is lying down at night.    Past Medical History  Diagnosis Date  . Diabetes mellitus   . Hypertension   . Hyperlipidemia   . Osteopenia     DEXA scan 7/07  . Chronic venous insufficiency   . Morbid obesity   . Diverticulitis     history of  . Leg edema     secondary to chronic venous insufficiency  . Cellulitis of left leg     history of, most recent episode 01/09   Current Outpatient Prescriptions  Medication Sig Dispense Refill  . furosemide (LASIX) 40 MG tablet Take 1 tablet (40 mg total) by mouth 2 (two) times daily.  30 tablet  0  . metFORMIN (GLUCOPHAGE) 1000 MG tablet TAKE 1 TABLET(S) BY MOUTH TWICE DAILY  60 tablet  6  . gabapentin (NEURONTIN) 300 MG capsule Take 1 capsule (300 mg total) by mouth 3 (three) times daily.  90 capsule  6  . glipiZIDE (GLUCOTROL) 5 MG tablet Take 1 tablet (5 mg total) by mouth daily.  30 tablet  6  . lisinopril (PRINIVIL,ZESTRIL) 20 MG tablet Take 1 tablet (20 mg total) by mouth daily.  30 tablet  6   No family history on file. History   Social History  . Marital Status: Married    Spouse Name: N/A    Number of Children: N/A  . Years of Education: N/A   Social History Main Topics  . Smoking status: Never Smoker   . Smokeless tobacco: None  . Alcohol Use: No  . Drug Use: No  . Sexually Active: None   Other Topics Concern    . None   Social History Narrative   One sister with chronic progressive disease requiring a trach, and 24/7 careOne sister with an abrupt hospitalization for a critical condition   Review of Systems: Constitutional: Denies fever, chills, diaphoresis, appetite change or fatigue.  HEENT: Denies photophobia, eye pain, redness, hearing loss, ear pain, congestion, sore throat, rhinorrhea, sneezing, mouth sores, trouble swallowing, neck pain, neck stiffness and tinnitus.   Respiratory: Denies SOB, DOE, cough, chest tightness,  and wheezing.   Cardiovascular: Denies chest pain, palpitations and leg swelling.  Gastrointestinal: Denies nausea, vomiting, abdominal pain, diarrhea, constipation, blood in stool and abdominal distention.  Genitourinary: Denies dysuria, urgency, frequency, hematuria, flank pain and difficulty urinating.  Musculoskeletal: Denies myalgias, back pain, joint swelling, arthralgias and gait problem.  Skin: Denies pallor, rash and wound.  Neurological: Endorses a burning, stabbing pain at the bottom of her feet but not in her toes. Denies dizziness, seizures, syncope, weakness, light-headedness, or headaches.  Psychiatric/Behavioral: Denies mood changes.  Objective:  Physical Exam: Filed Vitals:   12/16/11 1028  BP: 150/77  Pulse: 69  Temp: 98.3 F (36.8 C)  TempSrc: Oral  Height:  5\' 4"  (1.626 m)  Weight: 259 lb 11.2 oz (117.799 kg)  SpO2: 97%   Constitutional: Vital signs reviewed.  Patient is a well-developed and well-nourished female in no acute distress and cooperative with exam. Alert and oriented x3.  Head: Normocephalic and atraumatic Eyes: PERRL, EOMI, no scleral icterus.  Neck: Supple, Trachea midline normal ROM, No JVD, mass, or thyromegaly. Cardiovascular: RRR, no MRG, pulses symmetric and intact bilaterally Pulmonary/Chest: CTAB, no wheezes, rales, or rhonchi Abdominal: Obese, soft. non-tender, non-distended Musculoskeletal: No joint deformities,  erythema, or stiffness, ROM full and no nontender Hematology: No cervical adenopathy.  Neurological: A&O x3, Strength is normal and symmetric bilaterally, cranial nerve II-XII are grossly intact, no focal motor deficit, sensory intact to light touch bilaterally.  Skin: Warm, dry and intact. No rash, cyanosis, or clubbing.  Psychiatric: Normal mood and affect. Speech and behavior are normal.   Assessment & Plan:   Please refer to Problem list based A&P.

## 2011-12-17 ENCOUNTER — Encounter: Payer: Self-pay | Admitting: Internal Medicine

## 2011-12-17 NOTE — Assessment & Plan Note (Signed)
Blood sugars not well controlled on Metformin; will add Glipizide. Neuropathic pain in feet likely due to poor glucose control. Will start Neurontin. Also want pt to record blood sugar readings and bring them to her next clinic visit. Asked pt to keep food diary to help her see what she is eating.  - Glipizide 10mg  daily - Neurontin 300mg  TID - CBG log  - f/u in 2 weeks

## 2011-12-17 NOTE — Assessment & Plan Note (Signed)
BP is elevated 150/77. She is only on Lasix for her BLE edema. Will start Lisinopril.  - Lisinopril 20mg  daily

## 2011-12-20 NOTE — Progress Notes (Signed)
I saw, examined, and discussed the patient with Dr Glenn and agree with the note contained here.   

## 2011-12-30 ENCOUNTER — Encounter: Payer: Self-pay | Admitting: Internal Medicine

## 2011-12-30 ENCOUNTER — Ambulatory Visit (INDEPENDENT_AMBULATORY_CARE_PROVIDER_SITE_OTHER): Payer: Medicare Other | Admitting: Internal Medicine

## 2011-12-30 VITALS — BP 169/87 | HR 69 | Temp 98.8°F | Ht 64.0 in | Wt 265.1 lb

## 2011-12-30 DIAGNOSIS — I10 Essential (primary) hypertension: Secondary | ICD-10-CM

## 2011-12-30 DIAGNOSIS — Z299 Encounter for prophylactic measures, unspecified: Secondary | ICD-10-CM

## 2011-12-30 DIAGNOSIS — E119 Type 2 diabetes mellitus without complications: Secondary | ICD-10-CM

## 2011-12-30 LAB — GLUCOSE, CAPILLARY: Glucose-Capillary: 143 mg/dL — ABNORMAL HIGH (ref 70–99)

## 2011-12-30 MED ORDER — GLIPIZIDE 5 MG PO TABS: 10.0000 mg | ORAL_TABLET | Freq: Every day | ORAL | Status: DC

## 2011-12-30 NOTE — Progress Notes (Signed)
  Subjective:    Patient ID: Debra [name], female    DOB: [birth year], 66 y.o.   MRN: [id number]  HPI  Pt with pmhx significant for hypertension and poorly controlled diabetes with HgbA1c 10.4 on Metformin 1000mg  bid who was recently started on glypizide 5 mg qd for better glycemic control.  She brought her log book today which notes cbgs mostly 150s -mid 200s.  Reports that she is "getting over" sinusitis for which she has been taking OTC decongestant.  Her bp is elevated today at 169/87 pulse 69.  Review of Systems  Constitutional: Negative for fever.  HENT: Positive for congestion, rhinorrhea and postnasal drip. Negative for neck pain and neck stiffness.   Eyes: Negative for visual disturbance.  Respiratory: Negative for shortness of breath and wheezing.   Cardiovascular: Negative for chest pain.  Neurological: Negative for numbness and headaches.       Objective:   Physical Exam  Constitutional: She is oriented to person, place, and time. She appears well-developed and well-nourished.       Morbidly obese  HENT:  Head: Normocephalic and atraumatic.  Eyes: Conjunctivae normal and EOM are normal. Pupils are equal, round, and reactive to light.  Cardiovascular: Normal rate and regular rhythm.   Pulmonary/Chest: Effort normal and breath sounds normal.  Musculoskeletal: She exhibits edema.       Trace LE edema  Neurological: She is alert and oriented to person, place, and time.  Skin: Skin is warm and dry.  Psychiatric: She has a normal mood and affect.          Assessment & Plan:  1. DM poorly controlled: increase Glypizide to 10 mg qd, cont Metformin 1000 mg bid 2. HTN: above goal, decongestant likely contributing.  Will defer change inregimen today -Dispo: f/u in 4 weeks with PCP for further assessment of bp and glucose

## 2011-12-30 NOTE — Assessment & Plan Note (Addendum)
Last HgbA1c 10.4, cbg today 98, reports following advised diabetic diet, brought log today. Of note verified with pharmacy that pt gets Metformin from [facility] and rest of her meds from [facility].  Last refill 12/16/2011 thus pt appears to be compliant as of lately. -cont Metformin 1000mg  bid and increase glipizide to 10 mg qd -diabetic foot exam today -f/u 1 month for tolerance to increase glipizide and/or need to further increase, -pt to bring in glucose log

## 2011-12-30 NOTE — Patient Instructions (Signed)
Start taking 2 of the glipizide pills (10 mg) daily until the bottle is finished. Continue to follow the suggested diet and write down your blood sugars. Return to clinic in 1 month.

## 2011-12-30 NOTE — Assessment & Plan Note (Signed)
Above goal today on lisinopril 20 mg qd.  Pt taking OTC decongestant this morning. -reassess bp at next visit, pt advised to forego decongestants which can elevate bp

## 2011-12-30 NOTE — Assessment & Plan Note (Signed)
Pt states that she will "never have another colonoscopy"

## 2012-01-04 ENCOUNTER — Other Ambulatory Visit: Payer: Self-pay | Admitting: *Deleted

## 2012-01-04 DIAGNOSIS — E119 Type 2 diabetes mellitus without complications: Secondary | ICD-10-CM

## 2012-01-06 ENCOUNTER — Other Ambulatory Visit: Payer: Self-pay | Admitting: Internal Medicine

## 2012-01-06 MED ORDER — ACCU-CHEK ACTIVE CARE KIT KIT: 1.0000 | PACK | Status: DC | PRN

## 2012-01-06 MED ORDER — ACCU-CHEK FASTCLIX LANCETS MISC: 1.0000 | Status: DC | PRN

## 2012-01-06 MED ORDER — GLUCOSE BLOOD VI STRP: ORAL_STRIP | Status: DC

## 2012-01-07 MED ORDER — ACCU-CHEK AVIVA PLUS W/DEVICE KIT: 1.0000 | PACK | Status: DC | PRN

## 2012-01-11 NOTE — Telephone Encounter (Signed)
Called to pharm 

## 2012-01-28 ENCOUNTER — Encounter: Payer: Self-pay | Admitting: Internal Medicine

## 2012-01-28 NOTE — Progress Notes (Signed)
Patient ID: Debra [name], female   DOB: January 29, 1946, 66 y.o.   MRN: [id number]  This patient is a CHRONIC NO-SHOW PATIENT that has a history of HYPERTENSION.  Please make sure to address hypertension during her next clinic appointment, and intervene as appropriate.    Within the AVS, please incorporate the following smartphrase: .htntips   Pertinent Data: BP Readings from Last 3 Encounters:  12/30/11 169/87  12/16/11 150/77  07/15/11 175/88    BMI: Estimated Body mass index is 45.50 kg/(m^2) as calculated from the following:   Height as of 12/30/11: 5\' 4" (1.626 m).   Weight as of 12/30/11: 265 lb 1.6 oz(120.249 kg).  Smoking History: History  Smoking status  . Never Smoker   Smokeless tobacco  . Not on file    Last Basic Metabolic Panel:    Component Value Date/Time   [place] 137 07/13/2011 0730   K 4.2 07/13/2011 0730   CL 101 07/13/2011 0730   CO2 21 05/27/2011 1208   BUN 19 07/13/2011 0730   CREATININE 0.80 07/13/2011 0730   CREATININE 0.97 05/27/2011 1208   GLUCOSE 323* 07/13/2011 0730   CALCIUM 8.6 05/27/2011 1208    Allergies: Allergies  Allergen Reactions  . Aspirin Nausea Only  . Banana   . Hydrocodone Nausea Only  . Penicillins Hives and Itching  . Latex Rash

## 2012-02-04 ENCOUNTER — Telehealth: Payer: Self-pay | Admitting: *Deleted

## 2012-02-04 NOTE — Telephone Encounter (Signed)
Pt calls and states pharm refused her testing strips, i called the pharm and they need a new script stating times a day testing, diag code and whether insulin dependent or not. Pt states she tests twice daily am and pm. Could you send a new script with this info? Thank you

## 2012-02-09 ENCOUNTER — Telehealth: Payer: Self-pay | Admitting: *Deleted

## 2012-02-09 NOTE — Telephone Encounter (Signed)
Discussed with Debra [name]. Patient will need an appointment to discuss this further. Please schedule next available. Thank you.

## 2012-02-09 NOTE — Telephone Encounter (Signed)
We talked about strips

## 2012-02-10 ENCOUNTER — Telehealth: Payer: Self-pay | Admitting: *Deleted

## 2012-02-10 NOTE — Telephone Encounter (Signed)
Call to Medicare for Prior Authorization for Accu-Check Aviva Plus strips.  Form to be faxed for Dr. Bosie Clos to complete and fax back to Medicare.  Angelina Ok, RN 02/10/2012 1:42 PM.

## 2012-02-11 ENCOUNTER — Ambulatory Visit (HOSPITAL_COMMUNITY)
Admission: RE | Admit: 2012-02-11 | Discharge: 2012-02-11 | Disposition: A | Discharge status: Home / Self Care | Payer: Medicare Other | Source: Ambulatory Visit | Attending: Internal Medicine | Admitting: Internal Medicine

## 2012-02-11 ENCOUNTER — Other Ambulatory Visit: Payer: Self-pay | Admitting: Internal Medicine

## 2012-02-11 ENCOUNTER — Ambulatory Visit (INDEPENDENT_AMBULATORY_CARE_PROVIDER_SITE_OTHER): Payer: Medicare Other | Admitting: Internal Medicine

## 2012-02-11 ENCOUNTER — Encounter: Payer: Self-pay | Admitting: Internal Medicine

## 2012-02-11 VITALS — BP 131/71 | HR 66 | Temp 96.9°F | Ht 64.0 in | Wt 265.8 lb

## 2012-02-11 DIAGNOSIS — M79669 Pain in unspecified lower leg: Secondary | ICD-10-CM

## 2012-02-11 DIAGNOSIS — M79609 Pain in unspecified limb: Secondary | ICD-10-CM

## 2012-02-11 DIAGNOSIS — E785 Hyperlipidemia, unspecified: Secondary | ICD-10-CM

## 2012-02-11 DIAGNOSIS — E1140 Type 2 diabetes mellitus with diabetic neuropathy, unspecified: Secondary | ICD-10-CM

## 2012-02-11 DIAGNOSIS — E1142 Type 2 diabetes mellitus with diabetic polyneuropathy: Secondary | ICD-10-CM

## 2012-02-11 DIAGNOSIS — E119 Type 2 diabetes mellitus without complications: Secondary | ICD-10-CM

## 2012-02-11 DIAGNOSIS — I10 Essential (primary) hypertension: Secondary | ICD-10-CM

## 2012-02-11 DIAGNOSIS — M7700 Medial epicondylitis, unspecified elbow: Secondary | ICD-10-CM | POA: Insufficient documentation

## 2012-02-11 DIAGNOSIS — Z299 Encounter for prophylactic measures, unspecified: Secondary | ICD-10-CM

## 2012-02-11 DIAGNOSIS — I8002 Phlebitis and thrombophlebitis of superficial vessels of left lower extremity: Secondary | ICD-10-CM

## 2012-02-11 DIAGNOSIS — E1149 Type 2 diabetes mellitus with other diabetic neurological complication: Secondary | ICD-10-CM

## 2012-02-11 DIAGNOSIS — I803 Phlebitis and thrombophlebitis of lower extremities, unspecified: Secondary | ICD-10-CM | POA: Insufficient documentation

## 2012-02-11 LAB — BASIC METABOLIC PANEL
Chloride: 102 mEq/L (ref 96–112)
Potassium: 4.1 mEq/L (ref 3.5–5.3)
Sodium: 140 mEq/L (ref 135–145)

## 2012-02-11 LAB — GLUCOSE, CAPILLARY: Glucose-Capillary: 161 mg/dL — ABNORMAL HIGH (ref 70–99)

## 2012-02-11 MED ORDER — AMLODIPINE BESYLATE 5 MG PO TABS: 5.0000 mg | ORAL_TABLET | Freq: Every day | ORAL | Status: DC

## 2012-02-11 MED ORDER — GABAPENTIN 300 MG PO CAPS: 600.0000 mg | ORAL_CAPSULE | Freq: Three times a day (TID) | ORAL | Status: DC

## 2012-02-11 MED ORDER — NAPROXEN 500 MG PO TABS: 500.0000 mg | ORAL_TABLET | Freq: Two times a day (BID) | ORAL | Status: DC

## 2012-02-11 MED ORDER — LISINOPRIL 40 MG PO TABS: 40.0000 mg | ORAL_TABLET | Freq: Every day | ORAL | Status: DC

## 2012-02-11 MED ORDER — METFORMIN HCL 1000 MG PO TABS: 1000.0000 mg | ORAL_TABLET | Freq: Two times a day (BID) | ORAL | Status: DC

## 2012-02-11 MED ORDER — HYDROCHLOROTHIAZIDE 12.5 MG PO TABS: 25.0000 mg | ORAL_TABLET | Freq: Every day | ORAL | Status: DC

## 2012-02-11 MED ORDER — PRAVASTATIN SODIUM 20 MG PO TABS: 20.0000 mg | ORAL_TABLET | Freq: Every day | ORAL | Status: DC

## 2012-02-11 MED ORDER — HYDROCHLOROTHIAZIDE 25 MG PO TABS: 25.0000 mg | ORAL_TABLET | Freq: Every day | ORAL | Status: DC

## 2012-02-11 NOTE — Telephone Encounter (Signed)
i have tried to call pt back, the phone has now been disconnected

## 2012-02-11 NOTE — Progress Notes (Signed)
*  PRELIMINARY RESULTS* Vascular Ultrasound Left lower extremity venous duplex has been completed.  Preliminary findings: Left= No evidence of DVT. Superficial thrombosis involving the greater saphenous vein throughout the calf.  Called report to Dr. Shirlee Latch.  Farrel Demark, RDMS, RVT 02/11/2012, 11:23 AM

## 2012-02-11 NOTE — Assessment & Plan Note (Signed)
Lipid Panel     Component Value Date/Time   CHOL 227* 05/04/2011 1426   TRIG 371* 05/04/2011 1426   HDL 32* 05/04/2011 1426   CHOLHDL 7.1 05/04/2011 1426   VLDL 74* 05/04/2011 1426   LDLCALC 121* 05/04/2011 1426   Started Pravastatin 20 qhs, pt stated she did not tolerate Lipitor in the past

## 2012-02-11 NOTE — Patient Instructions (Addendum)
Please follow up in 1-2 weeks for blood pressure and diabetes  Bring your meter and all of your medications Please start taking your new medications for blood pressure, cholesterol, and pain Patient given information about medial epicondylitis  Hypertension Hypertension is another name for high blood pressure. High blood pressure may mean that your heart needs to work harder to pump blood. Blood pressure consists of two numbers, which includes a higher number over a lower number (example: 110/72). HOME CARE   Make lifestyle changes as told by your doctor. This may include weight loss and exercise.  Take your blood pressure medicine every day.  Limit how much salt you use.  Stop smoking if you smoke.  Do not use drugs.  Talk to your doctor if you are using decongestants or birth control pills. These medicines might make blood pressure higher.  Females should not drink more than 1 alcoholic drink per day. Males should not drink more than 2 alcoholic drinks per day.  See your doctor as told. GET HELP RIGHT AWAY IF:   You have a blood pressure reading with a top number of 180 or higher.  You get a very bad headache.  You get blurred or changing vision.  You feel confused.  You feel weak, numb, or faint.  You get chest or belly (abdominal) pain.  You throw up (vomit).  You cannot breathe very well. MAKE SURE YOU:   Understand these instructions.  Will watch your condition.  Will get help right away if you are not doing well or get worse. Document Released: 08/26/2007 Document Revised: 06/01/2011 Document Reviewed: 08/26/2007 [facility] Patient Information 2013 [place], [place]. Type 2 Diabetes Diabetes is a long-lasting (chronic) disease. One or both of the following happen with type 2 diabetes:   The pancreas does not make enough of a hormone called insulin.  The body has trouble using the insulin that is made. HOME CARE  Check your blood sugar (glucose) once a day,  or as told by your doctor.  Take all medicine as told by your doctor.  Do not smoke.  Eat healthy foods. Weight loss can help your diabetes.  Learn about low blood sugar (hypoglycemia). Know how to treat it.  Get your eyes checked on a regular basis.  Get a physical exam every year. Get your blood pressure checked. Get your blood and pee (urine) tested.  Wear a necklace or bracelet that says you have diabetes.  Check your feet every night for cuts, sores, blisters, and redness. Tell your doctor if you have problems. GET HELP RIGHT AWAY IF:  You have trouble keeping your blood sugar in target range.  You have problems with your medicines.  You are sick and not getting better after 24 hours.  You have a sore or wound that is not healing.  You have vision problems or changes.  You have a fever. MAKE SURE YOU:  Understand these instructions.  Will watch your condition.  Will get help right away if you are not doing well or get worse. Document Released: 12/17/2007 Document Revised: 06/01/2011 Document Reviewed: 08/25/2010 [facility] Patient Information 2013 [place], [place].

## 2012-02-11 NOTE — Telephone Encounter (Signed)
[place], could you please send pt a letter w/ an appt date w/ dr Collier Bullock asap to discuss diabetes? Thanks, Siah Steely
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
## 2012-02-11 NOTE — Assessment & Plan Note (Signed)
Pt declines Zostavax, eye MD 10/2011, declines colonoscopy, declines pneumonia vaccine though she may benefit b/c she has not had in 5 years.

## 2012-02-11 NOTE — Assessment & Plan Note (Addendum)
BP 151/78 with recheck 131/71  She is taking Lisinopril 20 mg qd, Lisinopril-HCTZ 20-25 mg daily.  She denies taking Lasix which was on her medication list before this visit.    Plan Rx Lisinopril 40 mg qd, HCTZ 25 mg qd, and Norvasc 5 mg qd.  Ed not to take combo pill anymore F/u 1-2 weeks HTN

## 2012-02-11 NOTE — Assessment & Plan Note (Signed)
Uncontrolled HA1C 10.4 patient is not diet compliant advised today  Cont. Glucotrol 10 mg qd, Metformin 1000 mg bid.  Plan Re-eval DM and if still elevated fsbs as meter readings today 250-270s lunch and dinner consider increasing Glucotrol to bid  Ed pt not to skip meals as she reported hypoglycemic episodes but none are noted on this meter reading Rx refill Metformin

## 2012-02-11 NOTE — Assessment & Plan Note (Signed)
Suspect medial epicondylitis due to point mild ttp with palpation. No edema, no erythema. Pain with ROM as well Consider also degenerative changes due to body habitus Will try trial of NSAIDS 500 mg bid x 6 weeks. Consider PT if not improving or imaging.

## 2012-02-11 NOTE — Assessment & Plan Note (Addendum)
previous dopplers 06/2011 she has an extensive superficial thrombosis of the greater saphenous vein in the left extremity beginning at the ankle and coursing through to approximately 3 inches before the confluence with the common femoral vein noted 07/13/11 with repeat duplex 07/16/11 left leg showing superficial thrombus in the left greater saphenous vein that does not extend into the saphenofemoral junction.  She had mild pain with dorsiflexion and palpation and per patient pain radiated from the left calf to the front left knee.    She also has varicosities in her left lower leg and has previously been diagnosed with superficial thrombophelebitis. Ddx: DVT, varicosities, superficial thrombophelebitis (less likely because there is no erythema and the left lower leg is chronically edematous)   Plan Performed US and prelim results did not show evidence of DVT but showed superficial thrombosis involving the greater saphenous vein throughout the calf She should continue to wear compression stockings.  NSAIDS for treating other condition may help.  Considered ASA 81 mg but it is controversial but patient allergies states  ASA causes nausea though she is taking it in another medication she takes for headaches.

## 2012-02-12 ENCOUNTER — Encounter: Payer: Self-pay | Admitting: Internal Medicine

## 2012-02-22 ENCOUNTER — Telehealth: Payer: Self-pay | Admitting: *Deleted

## 2012-02-22 NOTE — Telephone Encounter (Signed)
Pt has called for refill of strips, she is reminded that clinic attempted to call her and could not reach her. She is reminded that she needs to be seen to discuss w/ dr Collier Bullock diab testing

## 2012-02-23 ENCOUNTER — Encounter: Payer: Medicare Other | Admitting: Internal Medicine

## 2012-02-23 ENCOUNTER — Encounter: Payer: Self-pay | Admitting: Internal Medicine

## 2012-02-23 ENCOUNTER — Ambulatory Visit (INDEPENDENT_AMBULATORY_CARE_PROVIDER_SITE_OTHER): Payer: Medicare Other | Admitting: Internal Medicine

## 2012-02-23 VITALS — BP 139/71 | HR 73 | Temp 97.1°F | Wt 269.2 lb

## 2012-02-23 DIAGNOSIS — E119 Type 2 diabetes mellitus without complications: Secondary | ICD-10-CM

## 2012-02-23 DIAGNOSIS — E1149 Type 2 diabetes mellitus with other diabetic neurological complication: Secondary | ICD-10-CM

## 2012-02-23 DIAGNOSIS — I10 Essential (primary) hypertension: Secondary | ICD-10-CM

## 2012-02-23 DIAGNOSIS — M7700 Medial epicondylitis, unspecified elbow: Secondary | ICD-10-CM

## 2012-02-23 DIAGNOSIS — E1165 Type 2 diabetes mellitus with hyperglycemia: Secondary | ICD-10-CM

## 2012-02-23 DIAGNOSIS — E1140 Type 2 diabetes mellitus with diabetic neuropathy, unspecified: Secondary | ICD-10-CM

## 2012-02-23 DIAGNOSIS — E1142 Type 2 diabetes mellitus with diabetic polyneuropathy: Secondary | ICD-10-CM

## 2012-02-23 LAB — GLUCOSE, CAPILLARY: Glucose-Capillary: 116 mg/dL — ABNORMAL HIGH (ref 70–99)

## 2012-02-23 LAB — POCT GLYCOSYLATED HEMOGLOBIN (HGB A1C): Hemoglobin A1C: 7.4

## 2012-02-23 MED ORDER — GLIPIZIDE 5 MG PO TABS: 10.0000 mg | ORAL_TABLET | Freq: Two times a day (BID) | ORAL | Status: DC

## 2012-02-23 MED ORDER — GLUCOSE BLOOD VI STRP: ORAL_STRIP | Status: DC

## 2012-02-23 MED ORDER — GABAPENTIN 300 MG PO CAPS: 600.0000 mg | ORAL_CAPSULE | Freq: Three times a day (TID) | ORAL | Status: DC

## 2012-02-23 NOTE — Progress Notes (Signed)
Internal Medicine Clinic Visit    HPI:  Debra [name] is a 66 y.o. year old female who presents for regular follow up.  Patient states that she has been checking her BS twice per day or more. Reports one episodes of BS 60, but this was not recorded in her meter which she brings in today. She states she has been compliant with her medications. She has not, however, been eating a diabetic diet. She has high carb food and sugary desserts daily.  She also states she is sometimes dizzy after her PM naproxen dose, which was given to her for 'tennis elbow.' She is not having significant elbow pain, just once in a while.  Denies fever, chills, chest pain, DOE, urinary frequency.   Past Medical History  Diagnosis Date  . Diabetes mellitus   . Hypertension   . Hyperlipidemia   . Osteopenia     DEXA scan 7/07  . Chronic venous insufficiency   . Morbid obesity   . Diverticulitis     history of  . Leg edema     secondary to chronic venous insufficiency  . Cellulitis of left leg     history of, most recent episode 01/09    Past Surgical History  Procedure Date  . Appendectomy   . Abdominal hysterectomy   . Colectomy     with diverting colsotmy and revision in the 1990's for diverticulitis  . Eye surgery 8/13    left cataract removal & retinal repair     ROS:  A complete review of systems was otherwise negative, except as noted in the HPI.  Allergies: Aspirin; Banana; Hydrocodone; Penicillins; and Latex  Medications: Current Outpatient Prescriptions  Medication Sig Dispense Refill  . ACCU-CHEK FASTCLIX LANCETS MISC 1 each by Does not apply route as needed.  1 each  11  . amLODipine (NORVASC) 5 MG tablet Take 1 tablet (5 mg total) by mouth daily.  30 tablet  11  . Blood Glucose Monitoring Suppl (ACCU-CHEK ACTIVE CARE KIT) KIT 1 kit by Does not apply route as needed.  1 each  0  . Blood Glucose Monitoring Suppl (ACCU-CHEK AVIVA PLUS) W/DEVICE KIT 1 Device by Does not apply route as  needed. diag code 250.62 Check blood sugar up to 3 times daily  1 kit  0  . gabapentin (NEURONTIN) 300 MG capsule Take 2 capsules (600 mg total) by mouth 3 (three) times daily.  90 capsule  6  . glipiZIDE (GLUCOTROL) 5 MG tablet Take 2 tablets (10 mg total) by mouth 2 (two) times daily before a meal.  30 tablet  6  . glucose blood (ACCU-CHEK AVIVA PLUS) test strip Use to test blood glucose two times daily. Dx:250.00  100 each  12  . hydrochlorothiazide (HYDRODIURIL) 25 MG tablet Take 1 tablet (25 mg total) by mouth daily.  30 tablet  5  . lisinopril (PRINIVIL,ZESTRIL) 40 MG tablet Take 1 tablet (40 mg total) by mouth daily.  30 tablet  5  . metFORMIN (GLUCOPHAGE) 1000 MG tablet Take 1 tablet (1,000 mg total) by mouth 2 (two) times daily with a meal.  60 tablet  6  . naproxen (NAPROSYN) 500 MG tablet Take 1 tablet (500 mg total) by mouth 2 (two) times daily with a meal.  60 tablet  1  . pravastatin (PRAVACHOL) 20 MG tablet Take 1 tablet (20 mg total) by mouth daily.  30 tablet  5  . [DISCONTINUED] gabapentin (NEURONTIN) 300 MG capsule Take 2 capsules (  600 mg total) by mouth 3 (three) times daily.  90 capsule  6    History   Social History  . Marital Status: Married    Spouse Name: N/A    Number of Children: N/A  . Years of Education: N/A   Occupational History  . Not on file.   Social History Main Topics  . Smoking status: Never Smoker   . Smokeless tobacco: Not on file  . Alcohol Use: No  . Drug Use: No  . Sexually Active: Not on file   Other Topics Concern  . Not on file   Social History Narrative   One sister with chronic progressive disease requiring a trach, and 24/7 careOne sister with an abrupt hospitalization for a critical condition    family history is not on file.  Physical Exam Blood pressure 139/71, pulse 73, temperature 97.1 F (36.2 C), temperature source Oral, weight 269 lb 3.2 oz (122.108 kg), last menstrual period 07/02/1973, SpO2 98.00%. General:  No acute  distress, alert and oriented x 3, well-appearing  HEENT:  PERRL, EOMI, no lymphadenopathy, moist mucous membranes Cardiovascular:  Regular rate and rhythm, no murmurs, rubs or gallops Respiratory:  Clear to auscultation bilaterally, no wheezes, rales, or rhonchi Abdomen:  Soft, obese, nondistended, nontender, normoactive bowel sounds Extremities:  Warm and well-perfused, compression hose in place, 1+ edema, symmetric Skin: Warm, dry, no rashes Neuro: Not anxious appearing, no depressed mood, normal affect  Labs: Lab Results  Component Value Date   CREATININE 0.79 02/11/2012   BUN 20 02/11/2012   NA 140 02/11/2012   K 4.1 02/11/2012   CL 102 02/11/2012   CO2 28 02/11/2012   Lab Results  Component Value Date   WBC 10.2 01/28/2011   HGB 13.6 07/13/2011   HCT 40.0 07/13/2011   MCV 94.4 01/28/2011   PLT 255 01/28/2011      Assessment and Plan:    FOLLOWUP: [name] will follow back up in our clinic in approximately one month. [name] knows to call our clinic in the meantime with any questions or new issues.   Patient was seen and evaluated by Denton Ar, MD,  and Margarito Liner, MD Attending Physician.

## 2012-02-23 NOTE — Assessment & Plan Note (Signed)
BP Readings from Last 3 Encounters:  02/23/12 139/71  02/11/12 131/71  12/30/11 169/87    Sodium  Date Value Range Status  02/11/2012 140  135 - 145 mEq/L Final     Potassium  Date Value Range Status  02/11/2012 4.1  3.5 - 5.3 mEq/L Final     Creat  Date Value Range Status  02/11/2012 0.79  0.50 - 1.10 mg/dL Final    Assessment:  Blood pressure control: controlled  Progress toward BP goal:  at goal  Comments: none  Plan:  Medications:  continue current medications  Educational resources provided: brochure  Self management tools provided:    Other plans:

## 2012-02-23 NOTE — Assessment & Plan Note (Addendum)
Hemoglobin A1C  Date Value Range Status  02/23/2012 7.4   Final  12/16/2011 10.4   Final  05/27/2011 10.7   Final     Assessment:  Diabetes control: fair control  Progress toward A1C goal:  unable to assess  Comments:  Patient actually improved control since her last visit in September.  Plan:  Medications:  Will increase Glipizide to BID dosing. Continue metformin  Home glucose monitoring:   Frequency: 2 times a day   Timing: before breakfast;before dinner  Instruction/counseling given: reminded to get eye exam, reminded to bring blood glucose meter & log to each visit, reminded to bring medications to each visit, discussed foot care and discussed the need for weight loss  Educational resources provided: brochure  Self management tools provided: copy of home glucose meter download  Other plans:   Also discussed goal of cutting back on sweets to twice per week rather than every day. Given patient's recent poor control of DM, she would benefit from checking her sugar QD to BID.  Regarding patient neuropathy, she is having significant foot pain. We will increase her dose of neurontin to 600mg  TID and see if her symtpoms improve with this. She is to reduce dose and call clinic if she has dizziness or sleepiness with this medication. Return to clinic in 1 month.

## 2012-02-23 NOTE — Patient Instructions (Addendum)
General Instructions:    Treatment Goals:  Goals (1 Years of Data) as of 02/23/2012          As of Today 02/11/12 02/11/12 12/30/11 12/16/11     Blood Pressure    . Blood Pressure < 140/90  139/71 131/71 151/78 169/87 150/77     Result Component    . HEMOGLOBIN A1C < 7.0      10.4    . LDL CALC < 100            Progress Toward Treatment Goals:  Treatment Goal 02/23/2012  Hemoglobin A1C unable to assess  Blood pressure at goal    Self Care Goals & Plans:  Self Care Goal 02/23/2012  Manage my medications bring my medications to every visit; take my medicines as prescribed  Monitor my health keep track of my blood glucose  Eat healthy foods eat baked foods instead of fried foods; eat fruit for snacks and desserts; eat foods that are low in salt; drink diet soda or water instead of juice or soda; eat more vegetables; eat smaller portions  Be physically active take a walk every day    Home Blood Glucose Monitoring 02/23/2012  Check my blood sugar 2 times a day  When to check my blood sugar before breakfast; before dinner     Care Management & Community Referrals:  Referral 02/23/2012  Referrals made for care management support none needed  Referrals made to community resources other (see comments)

## 2012-03-14 ENCOUNTER — Ambulatory Visit (INDEPENDENT_AMBULATORY_CARE_PROVIDER_SITE_OTHER): Payer: Medicare Other | Admitting: Ophthalmology

## 2012-03-14 DIAGNOSIS — H43819 Vitreous degeneration, unspecified eye: Secondary | ICD-10-CM

## 2012-03-14 DIAGNOSIS — I10 Essential (primary) hypertension: Secondary | ICD-10-CM

## 2012-03-14 DIAGNOSIS — H35039 Hypertensive retinopathy, unspecified eye: Secondary | ICD-10-CM

## 2012-03-14 DIAGNOSIS — E11319 Type 2 diabetes mellitus with unspecified diabetic retinopathy without macular edema: Secondary | ICD-10-CM

## 2012-03-14 DIAGNOSIS — E1139 Type 2 diabetes mellitus with other diabetic ophthalmic complication: Secondary | ICD-10-CM

## 2012-03-14 DIAGNOSIS — H33309 Unspecified retinal break, unspecified eye: Secondary | ICD-10-CM

## 2012-03-14 DIAGNOSIS — H2510 Age-related nuclear cataract, unspecified eye: Secondary | ICD-10-CM

## 2012-03-18 ENCOUNTER — Emergency Department (HOSPITAL_COMMUNITY)
Admission: EM | Admit: 2012-03-18 | Discharge: 2012-03-19 | Disposition: A | Discharge status: Home / Self Care | Payer: Medicare Other | Attending: Emergency Medicine | Admitting: Emergency Medicine

## 2012-03-18 ENCOUNTER — Encounter (HOSPITAL_COMMUNITY): Payer: Self-pay | Admitting: *Deleted

## 2012-03-18 DIAGNOSIS — Y9389 Activity, other specified: Secondary | ICD-10-CM | POA: Insufficient documentation

## 2012-03-18 DIAGNOSIS — Z79899 Other long term (current) drug therapy: Secondary | ICD-10-CM | POA: Insufficient documentation

## 2012-03-18 DIAGNOSIS — Z9889 Other specified postprocedural states: Secondary | ICD-10-CM | POA: Insufficient documentation

## 2012-03-18 DIAGNOSIS — E119 Type 2 diabetes mellitus without complications: Secondary | ICD-10-CM | POA: Insufficient documentation

## 2012-03-18 DIAGNOSIS — H5789 Other specified disorders of eye and adnexa: Secondary | ICD-10-CM | POA: Insufficient documentation

## 2012-03-18 DIAGNOSIS — E785 Hyperlipidemia, unspecified: Secondary | ICD-10-CM | POA: Insufficient documentation

## 2012-03-18 DIAGNOSIS — Z8719 Personal history of other diseases of the digestive system: Secondary | ICD-10-CM | POA: Insufficient documentation

## 2012-03-18 DIAGNOSIS — Z8679 Personal history of other diseases of the circulatory system: Secondary | ICD-10-CM | POA: Insufficient documentation

## 2012-03-18 DIAGNOSIS — T7840XA Allergy, unspecified, initial encounter: Secondary | ICD-10-CM

## 2012-03-18 DIAGNOSIS — T495X1A Poisoning by ophthalmological drugs and preparations, accidental (unintentional), initial encounter: Secondary | ICD-10-CM | POA: Insufficient documentation

## 2012-03-18 DIAGNOSIS — I10 Essential (primary) hypertension: Secondary | ICD-10-CM | POA: Insufficient documentation

## 2012-03-18 DIAGNOSIS — Y929 Unspecified place or not applicable: Secondary | ICD-10-CM | POA: Insufficient documentation

## 2012-03-18 DIAGNOSIS — Z8739 Personal history of other diseases of the musculoskeletal system and connective tissue: Secondary | ICD-10-CM | POA: Insufficient documentation

## 2012-03-18 MED ORDER — DIPHENHYDRAMINE HCL 25 MG PO CAPS
50.0000 mg | ORAL_CAPSULE | Freq: Once | ORAL | Status: AC
  Administered 2012-03-18: 50 mg via ORAL
  Filled 2012-03-18: qty 2

## 2012-03-18 NOTE — ED Notes (Signed)
The pt placed some old neosporin polymycin. Eye drops.  In august she had retina surgery and today she felt like she had something in the lt eye all day the surgery eye.  30 minutes ago she rinsed the eye with the neosporin drops.  She immediately started itching all over her body and the lt eyelids are swollen almost closed.  No respiratory difficulty

## 2012-03-19 MED ORDER — DIPHENHYDRAMINE HCL 25 MG PO TABS: 50.0000 mg | ORAL_TABLET | Freq: Four times a day (QID) | ORAL | Status: DC | PRN

## 2012-03-19 MED ORDER — PREDNISOLONE ACETATE 1 % OP SUSP
1.0000 [drp] | Freq: Four times a day (QID) | OPHTHALMIC | Status: DC
  Administered 2012-03-19: 1 [drp] via OPHTHALMIC
  Filled 2012-03-19: qty 1

## 2012-03-19 NOTE — ED Notes (Signed)
Pt swelling continues to decrease.  Pt denies pain at this time. Pt questioning how much longer to be seen.

## 2012-03-19 NOTE — ED Provider Notes (Signed)
History     CSN: [id number]  Arrival date & time 03/18/12  2052   First MD Initiated Contact with Patient 03/19/12 0304      Chief Complaint  Patient presents with  . Allergic Reaction    (Consider location/radiation/quality/duration/timing/severity/associated sxs/prior treatment) HPI This is a 66 year old female with a history of surgery for detached retina of the left eye in August. Yesterday morning she developed a foreign body sensation in the left eye. Yesterday evening she irrigated the left eye with what she thought was eye wash but was actually neomycin, polymyxin B and gramicidin ophthalmic solution. She had used that postoperatively after her retina surgery without incident. Soon after using this she developed edema of the left eye does and generalized itching. The symptoms are moderate to severe. They were not associated with shortness of breath, throat swelling, nausea, vomiting or diarrhea. On arrival she was given 50 mg of Benadryl per protocol with significant improvement in her itching. The foreign body sensation in her left eye is resolved. She continues to have edema around the left eye.  Past Medical History  Diagnosis Date  . Diabetes mellitus   . Hypertension   . Hyperlipidemia   . Osteopenia     DEXA scan 7/07  . Chronic venous insufficiency   . Morbid obesity   . Diverticulitis     history of  . Leg edema     secondary to chronic venous insufficiency  . Cellulitis of left leg     history of, most recent episode 01/09    Past Surgical History  Procedure Date  . Appendectomy   . Abdominal hysterectomy   . Colectomy     with diverting colsotmy and revision in the 1990's for diverticulitis  . Eye surgery 8/13    left cataract removal & retinal repair    No family history on file.  History  Substance Use Topics  . Smoking status: Never Smoker   . Smokeless tobacco: Not on file  . Alcohol Use: No    OB History    Grav Para Term Preterm  Abortions TAB SAB Ect Mult Living                  Review of Systems  All other systems reviewed and are negative.    Allergies  Aspirin; Banana; Hydrocodone; Neomycin-polymyxin-gramicidin; Penicillins; and Latex  Home Medications   Current Outpatient Rx  Name  Route  Sig  Dispense  Refill  . AMLODIPINE BESYLATE 5 MG PO TABS   Oral   Take 1 tablet (5 mg total) by mouth daily.   30 tablet   11   . GABAPENTIN 300 MG PO CAPS   Oral   Take 2 capsules (600 mg total) by mouth 3 (three) times daily.   90 capsule   6   . GLIPIZIDE 5 MG PO TABS   Oral   Take 10 mg by mouth daily before breakfast.         . HYDROCHLOROTHIAZIDE 25 MG PO TABS   Oral   Take 25 mg by mouth daily.         Marland Kitchen LISINOPRIL 40 MG PO TABS   Oral   Take 1 tablet (40 mg total) by mouth daily.   30 tablet   5   . METFORMIN HCL 1000 MG PO TABS   Oral   Take 1 tablet (1,000 mg total) by mouth 2 (two) times daily with a meal.   60 tablet  6     Rx has expired - unused refills remain   . NAPROXEN 500 MG PO TABS   Oral   Take 500 mg by mouth daily after breakfast. pain         . PRAVASTATIN SODIUM 20 MG PO TABS   Oral   Take 1 tablet (20 mg total) by mouth daily.   30 tablet   5   . ACCU-CHEK FASTCLIX LANCETS MISC   Does not apply   1 each by Does not apply route as needed.   1 each   11   . ACCU-CHEK ACTIVE CARE KIT KIT   Does not apply   1 kit by Does not apply route as needed.   1 each   0   . ACCU-CHEK AVIVA PLUS W/DEVICE KIT   Does not apply   1 Device by Does not apply route as needed. diag code 250.62 Check blood sugar up to 3 times daily   1 kit   0   . GLUCOSE BLOOD VI STRP      Use to test blood glucose two times daily. Dx:250.00   100 each   12     BP 152/76  Pulse 76  Temp 98.3 F (36.8 C) (Oral)  Resp 16  SpO2 97%  LMP 07/02/1973  Physical Exam General: Well-developed, well-nourished female in no acute distress; appearance consistent with age of  record HENT: normocephalic, atraumatic Eyes: pupils equal round and reactive to light; extraocular muscles intact; conjunctival injection left; periorbital edema left Neck: supple Heart: regular rate and rhythm Lungs: clear to auscultation bilaterally Abdomen: soft; nondistended Extremities: No deformity; full range of motion Neurologic: Awake, alert and oriented; motor function intact in all extremities and symmetric; no facial droop Skin: Warm and dry Psychiatric: Normal mood and affect    ED Course  Procedures (including critical care time)     MDM  We will continue the patient on Benadryl 50 mg every 6 hours as needed. We'll avoid steroids as she is a diabetic. We will place her on Ciprodex drops over the weekend and will have her contact her ophthalmologist on Monday.        Hanley Seamen, MD 03/19/12 [phone]

## 2012-03-19 NOTE — ED Notes (Signed)
Pt dc to home.  Pt states understanding to dc paperwork and with following up with pcp.  Pt ambulatory to exit without difficulty.  Pt denied need for w/c.

## 2012-03-19 NOTE — ED Notes (Signed)
The pts itching is minimal.  The swelling in her  Lt eyelids are still red but less swollen and she reports that it feels better.  Still no respiratory difficulty

## 2012-03-25 ENCOUNTER — Ambulatory Visit: Payer: Medicare Other | Admitting: Internal Medicine

## 2012-03-30 ENCOUNTER — Ambulatory Visit (INDEPENDENT_AMBULATORY_CARE_PROVIDER_SITE_OTHER): Payer: Medicare HMO | Admitting: Internal Medicine

## 2012-03-30 ENCOUNTER — Encounter: Payer: Self-pay | Admitting: Internal Medicine

## 2012-03-30 VITALS — BP 121/70 | HR 68 | Temp 97.8°F | Resp 20 | Ht 63.5 in | Wt 264.3 lb

## 2012-03-30 DIAGNOSIS — H35039 Hypertensive retinopathy, unspecified eye: Secondary | ICD-10-CM

## 2012-03-30 DIAGNOSIS — E1149 Type 2 diabetes mellitus with other diabetic neurological complication: Secondary | ICD-10-CM

## 2012-03-30 DIAGNOSIS — J209 Acute bronchitis, unspecified: Secondary | ICD-10-CM

## 2012-03-30 DIAGNOSIS — J208 Acute bronchitis due to other specified organisms: Secondary | ICD-10-CM

## 2012-03-30 DIAGNOSIS — E1142 Type 2 diabetes mellitus with diabetic polyneuropathy: Secondary | ICD-10-CM

## 2012-03-30 DIAGNOSIS — E1140 Type 2 diabetes mellitus with diabetic neuropathy, unspecified: Secondary | ICD-10-CM

## 2012-03-30 LAB — GLUCOSE, CAPILLARY: Glucose-Capillary: 128 mg/dL — ABNORMAL HIGH (ref 70–99)

## 2012-03-30 MED ORDER — GLIPIZIDE 10 MG PO TABS: 10.0000 mg | ORAL_TABLET | Freq: Every day | ORAL | Status: DC

## 2012-03-30 NOTE — Assessment & Plan Note (Addendum)
Patient has been doing well on increased dose of glipizide. She states her sugars have been running 100 to 130s most of the time, with elevations as high as 180 recently as she was sick with a upper respiratory infection. She denies any side effects from this medication and asked for refills. Patient has also been doing better with cutting back on her sugar intake. She has also been decreasing portion size for meals. She still does have a sweet before bed but not every day.  Regarding her peripheral neuropathy, we had increased her dose of Neurontin to 600 mg at the last office visit. She states that her leg pain is significantly improved since then. She does still have some burning/tingling pain at times, but it has eased up significantly. She denies any symptoms of sedation with this new dosage.  -Will followup in 2-3 months for A1c check -Continue to check sugars daily -Continue glipizide 10 mg daily -Continue Neurontin 600 mg 3 times a day

## 2012-03-30 NOTE — Assessment & Plan Note (Addendum)
Patient presents with residual cough after one week of URI symptoms. She states this is mostly nonproductive, but rarely brings up thick white sputum. She denies fever, chills, shortness of breath, chest pain. Exam did not show any wheezing, rales or rhonchi. Therefore, we will treat symptomatically with OTC cough medication. Debra [name] knows to call her clinic if her symptoms do not resolve or worsen.  I did consider the possibility that lisinopril was the cause of her cough, however, her symptoms were not temporally related to the initiation of lisinopril and furthermore only arose after her recent URI. We'll continue to monitor and consider switching to an ARB if this cough becomes chronic.

## 2012-03-30 NOTE — Assessment & Plan Note (Signed)
ADDENDUM: Received office notes from patient's retinal specialist Dr. Ashley Royalty. 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 Grade 2 hypertensive retinopathy noted. Patient also has background diabetic retinopathy that does not need to be treated at this time. She also has a history of retinal tear status post laser repair.  -Will continue to be aggressive with treating patient's blood pressure -Continue to see ophthalmologist as required

## 2012-03-30 NOTE — Progress Notes (Signed)
Internal Medicine Clinic Visit    HPI:  Debra [name] is a 67 y.o. year old female with a history of type 2 diabetes, hypertension, hyperlipidemia, obesity, who presents for follow up. She states that her blood sugars have been doing relatively well since last visit. They have been elevated the past 2 weeks since the onset of her upper respiratory infection.  She states that she had symptoms of a URI including sore throat, cough, congestion 2 weeks ago. Her symptoms have now resolved except for an irritating nonproductive cough. She has tried over-the-counter cold and cough medicine which have helped a little bit. She has also used Primatene. Her cough is minimally bothersome. She denies chest pain, shortness of breath, wheezing, fever, chills, weight loss, night sweats.  Debra [name] also states that her leg pain has much improved on the increased dose of gabapentin.  She denies itchy eyes, postnasal drip, abdominal pain, nausea, vomiting, hemoptysis, pleurisy. Past medical history reviewed without major changes.   Past Medical History  Diagnosis Date  . Diabetes mellitus   . Hypertension   . Hyperlipidemia   . Osteopenia     DEXA scan 7/07  . Chronic venous insufficiency   . Morbid obesity   . Diverticulitis     history of  . Leg edema     secondary to chronic venous insufficiency  . Cellulitis of left leg     history of, most recent episode 01/09    Past Surgical History  Procedure Date  . Appendectomy   . Abdominal hysterectomy   . Colectomy     with diverting colsotmy and revision in the 1990's for diverticulitis  . Eye surgery 8/13    left cataract removal & retinal repair     ROS:  A complete review of systems was otherwise negative, except as noted in the HPI.  Allergies: Aspirin; Banana; Hydrocodone; Neomycin-polymyxin-gramicidin; Penicillins; and Latex  Medications: Current Outpatient Prescriptions  Medication Sig Dispense Refill  . ACCU-CHEK FASTCLIX LANCETS  MISC 1 each by Does not apply route as needed.  1 each  11  . amLODipine (NORVASC) 5 MG tablet Take 1 tablet (5 mg total) by mouth daily.  30 tablet  11  . Blood Glucose Monitoring Suppl (ACCU-CHEK ACTIVE CARE KIT) KIT 1 kit by Does not apply route as needed.  1 each  0  . Blood Glucose Monitoring Suppl (ACCU-CHEK AVIVA PLUS) W/DEVICE KIT 1 Device by Does not apply route as needed. diag code 250.62 Check blood sugar up to 3 times daily  1 kit  0  . diphenhydrAMINE (BENADRYL) 25 MG tablet Take 2 tablets (50 mg total) by mouth every 6 (six) hours as needed for itching.      . gabapentin (NEURONTIN) 300 MG capsule Take 2 capsules (600 mg total) by mouth 3 (three) times daily.  90 capsule  6  . glipiZIDE (GLUCOTROL) 5 MG tablet Take 10 mg by mouth daily before breakfast.      . glucose blood (ACCU-CHEK AVIVA PLUS) test strip Use to test blood glucose two times daily. Dx:250.00  100 each  12  . hydrochlorothiazide (HYDRODIURIL) 25 MG tablet Take 25 mg by mouth daily.      Marland Kitchen lisinopril (PRINIVIL,ZESTRIL) 40 MG tablet Take 1 tablet (40 mg total) by mouth daily.  30 tablet  5  . metFORMIN (GLUCOPHAGE) 1000 MG tablet Take 1 tablet (1,000 mg total) by mouth 2 (two) times daily with a meal.  60 tablet  6  .  naproxen (NAPROSYN) 500 MG tablet Take 500 mg by mouth daily after breakfast. pain      . pravastatin (PRAVACHOL) 20 MG tablet Take 1 tablet (20 mg total) by mouth daily.  30 tablet  5    History   Social History  . Marital Status: Married    Spouse Name: N/A    Number of Children: N/A  . Years of Education: N/A   Occupational History  . Not on file.   Social History Main Topics  . Smoking status: Never Smoker   . Smokeless tobacco: Not on file  . Alcohol Use: No  . Drug Use: No  . Sexually Active: Not on file   Other Topics Concern  . Not on file   Social History Narrative   One sister with chronic progressive disease requiring a trach, and 24/7 careOne sister with an abrupt  hospitalization for a critical condition    family history is not on file.  Physical Exam Blood pressure 121/70, pulse 68, temperature 97.8 F (36.6 C), temperature source Oral, resp. rate 20, height 5' 3.5" (1.613 m), weight 264 lb 4.8 oz (119.886 kg), last menstrual period 07/02/1973, SpO2 97.00%. General:  No acute distress, obese, alert and oriented x 3, well-appearing  HEENT:  PERRL, EOMI, moist mucous membranes, oropharynx clear, no erythema or exudate, tympanic membranes intact and normal appearing, canal clear, nasal mucosa clear Cardiovascular:  Regular rate and rhythm, no murmurs, rubs or gallops Respiratory:  Clear to auscultation bilaterally, no wheezes, rales, or rhonchi, good effort. Abdomen:  Soft, nondistended, nontender, normoactive bowel sounds Extremities:  Warm and well-perfused. Edema bilateral lower extremities Skin: Warm, dry, no rashes Neuro: Not anxious appearing, no depressed mood, normal affect  Labs: Lab Results  Component Value Date   CREATININE 0.79 02/11/2012   BUN 20 02/11/2012   NA 140 02/11/2012   K 4.1 02/11/2012   CL 102 02/11/2012   CO2 28 02/11/2012   Lab Results  Component Value Date   WBC 10.2 01/28/2011   HGB 13.6 07/13/2011   HCT 40.0 07/13/2011   MCV 94.4 01/28/2011   PLT 255 01/28/2011      Assessment and Plan:    FOLLOWUP: Debra [name] will follow back up in our clinic in approximately  2-3 months. Debra [name] knows to call out clinic in the meantime with any questions or new issues.

## 2012-03-30 NOTE — Patient Instructions (Signed)
Please follow up in 2-3 months

## 2012-05-23 ENCOUNTER — Encounter: Payer: Medicare HMO | Admitting: Internal Medicine

## 2012-05-26 ENCOUNTER — Telehealth: Payer: Self-pay | Admitting: *Deleted

## 2012-05-26 NOTE — Telephone Encounter (Signed)
Pt called with concerns about her low CBG's. She takes glipizide 10 mg daily  But ran out middle of Feb and restarted  feb 28th.   She also takes metformin 1000 mg bid.  Last night sugar dropped to 79 and she felt weird.  This AM sugar was 101 before lunch and she felt bad until she ate.  I called pt and she is scared because she normally runs 140 - 145.  She has been skipping some meals . I advised her to be sure and eat a good diet and watch her sugars closely.  Call back if her sugar runs below 80 or over 150. She voice understanding.   This message was sent to [facility] for f/u but she is off today and Friday.

## 2012-05-26 NOTE — Telephone Encounter (Signed)
I spoke with patient along with triage nurse by phone.  Given the symptoms when her blood sugar was 79 and the fact that she seems to be eating less, I advised that she cut her glipizide in half (she confirmed that it is a scored tablet) and take 5 mg daily.  I also advised that she followup in the clinic next week.

## 2012-05-26 NOTE — Telephone Encounter (Signed)
Pt called back per request of Dr Meredith Pel, He talked with pt and will have pt cut the glipizide 10 mg tab  in half and f/u with [place] on Tuesday in the clinic. Pt voices understanding.

## 2012-05-31 ENCOUNTER — Ambulatory Visit: Payer: Medicare HMO | Admitting: Internal Medicine

## 2012-06-22 ENCOUNTER — Encounter (HOSPITAL_COMMUNITY): Payer: Self-pay | Admitting: Emergency Medicine

## 2012-06-22 ENCOUNTER — Emergency Department (HOSPITAL_COMMUNITY)
Admission: EM | Admit: 2012-06-22 | Discharge: 2012-06-23 | Disposition: A | Discharge status: Home / Self Care | Payer: Medicare HMO | Attending: Emergency Medicine | Admitting: Emergency Medicine

## 2012-06-22 ENCOUNTER — Telehealth: Payer: Self-pay | Admitting: *Deleted

## 2012-06-22 DIAGNOSIS — I10 Essential (primary) hypertension: Secondary | ICD-10-CM | POA: Insufficient documentation

## 2012-06-22 DIAGNOSIS — I872 Venous insufficiency (chronic) (peripheral): Secondary | ICD-10-CM | POA: Insufficient documentation

## 2012-06-22 DIAGNOSIS — J36 Peritonsillar abscess: Secondary | ICD-10-CM

## 2012-06-22 DIAGNOSIS — Z8719 Personal history of other diseases of the digestive system: Secondary | ICD-10-CM | POA: Insufficient documentation

## 2012-06-22 DIAGNOSIS — Z79899 Other long term (current) drug therapy: Secondary | ICD-10-CM | POA: Insufficient documentation

## 2012-06-22 DIAGNOSIS — R059 Cough, unspecified: Secondary | ICD-10-CM | POA: Insufficient documentation

## 2012-06-22 DIAGNOSIS — E119 Type 2 diabetes mellitus without complications: Secondary | ICD-10-CM | POA: Insufficient documentation

## 2012-06-22 DIAGNOSIS — R05 Cough: Secondary | ICD-10-CM | POA: Insufficient documentation

## 2012-06-22 DIAGNOSIS — Z8639 Personal history of other endocrine, nutritional and metabolic disease: Secondary | ICD-10-CM | POA: Insufficient documentation

## 2012-06-22 DIAGNOSIS — Z862 Personal history of diseases of the blood and blood-forming organs and certain disorders involving the immune mechanism: Secondary | ICD-10-CM | POA: Insufficient documentation

## 2012-06-22 DIAGNOSIS — R1310 Dysphagia, unspecified: Secondary | ICD-10-CM | POA: Insufficient documentation

## 2012-06-22 DIAGNOSIS — J3489 Other specified disorders of nose and nasal sinuses: Secondary | ICD-10-CM | POA: Insufficient documentation

## 2012-06-22 NOTE — ED Notes (Signed)
PT. REPORTS SORE THROAT FOR 3 DAYS " HARD TO SWALLOW" UNRELIEVED BY OTC COLD MEDICATIONS , RESPIRATIONS UNLABORED / AIRWAY INTACT.

## 2012-06-22 NOTE — Telephone Encounter (Signed)
Pt calls and c/o very sore throat x 2-3 days, becoming worse, thinks she may have strep, denies fevers, N&V, h/a. appt at pt's request 4/3 dr Lavena Bullion [phone]

## 2012-06-23 ENCOUNTER — Ambulatory Visit: Payer: Medicare HMO | Admitting: Radiation Oncology

## 2012-06-23 ENCOUNTER — Other Ambulatory Visit: Payer: Self-pay | Admitting: Internal Medicine

## 2012-06-23 LAB — RAPID STREP SCREEN (MED CTR MEBANE ONLY): Streptococcus, Group A Screen (Direct): POSITIVE — AB

## 2012-06-23 MED ORDER — CLINDAMYCIN PHOSPHATE 600 MG/50ML IV SOLN
600.0000 mg | Freq: Once | INTRAVENOUS | Status: AC
  Administered 2012-06-23: 600 mg via INTRAVENOUS
  Filled 2012-06-23: qty 50

## 2012-06-23 MED ORDER — DEXAMETHASONE SODIUM PHOSPHATE 10 MG/ML IJ SOLN
10.0000 mg | Freq: Once | INTRAMUSCULAR | Status: AC
  Administered 2012-06-23: 10 mg via INTRAVENOUS
  Filled 2012-06-23: qty 1

## 2012-06-23 MED ORDER — MORPHINE SULFATE 4 MG/ML IJ SOLN
4.0000 mg | Freq: Once | INTRAMUSCULAR | Status: AC
  Administered 2012-06-23: 4 mg via INTRAVENOUS
  Filled 2012-06-23: qty 1

## 2012-06-23 MED ORDER — CLINDAMYCIN HCL 300 MG PO CAPS: 300.0000 mg | ORAL_CAPSULE | Freq: Four times a day (QID) | ORAL | Status: DC

## 2012-06-23 MED ORDER — OXYCODONE-ACETAMINOPHEN 5-325 MG PO TABS: 1.0000 | ORAL_TABLET | ORAL | Status: DC | PRN

## 2012-06-23 NOTE — ED Provider Notes (Signed)
Medical screening examination/treatment/procedure(s) were performed by non-physician practitioner and as supervising physician I was immediately available for consultation/collaboration.   Joya Gaskins, MD 06/23/12 [phone]

## 2012-06-23 NOTE — ED Provider Notes (Signed)
History     CSN: [id number]  Arrival date & time 06/22/12  2234   None     Chief Complaint  Patient presents with  . Sore Throat    (Consider location/radiation/quality/duration/timing/severity/associated sxs/prior treatment) HPI History provided by pt.   66yo diabetic F presents w/ L-sided sore throat and odynophagia x 3 days.  No relief w/ OTC analgesics.  Associated w/ chronic nasal congestion, rhinorrhea and cough that she attributes to allergies.  Denies fever.  No known sick contacts.    Past Medical History  Diagnosis Date  . Diabetes mellitus   . Hypertension   . Hyperlipidemia   . Osteopenia     DEXA scan 7/07  . Chronic venous insufficiency   . Morbid obesity   . Diverticulitis     history of  . Leg edema     secondary to chronic venous insufficiency  . Cellulitis of left leg     history of, most recent episode 01/09    Past Surgical History  Procedure Laterality Date  . Appendectomy    . Abdominal hysterectomy    . Colectomy      with diverting colsotmy and revision in the 1990's for diverticulitis  . Eye surgery  8/13    left cataract removal & retinal repair    No family history on file.  History  Substance Use Topics  . Smoking status: Never Smoker   . Smokeless tobacco: Not on file  . Alcohol Use: No    OB History   Grav Para Term Preterm Abortions TAB SAB Ect Mult Living                  Review of Systems  All other systems reviewed and are negative.    Allergies  Aspirin; Banana; Hydrocodone; Neomycin-polymyxin-gramicidin; Penicillins; and Latex  Home Medications   Current Outpatient Rx  Name  Route  Sig  Dispense  Refill  . amLODipine (NORVASC) 5 MG tablet   Oral   Take 1 tablet (5 mg total) by mouth daily.   30 tablet   11   . diphenhydrAMINE (BENADRYL) 25 MG tablet   Oral   Take 2 tablets (50 mg total) by mouth every 6 (six) hours as needed for itching.         . gabapentin (NEURONTIN) 300 MG capsule   Oral   Take  2 capsules (600 mg total) by mouth 3 (three) times daily.   90 capsule   6   . glipiZIDE (GLUCOTROL) 10 MG tablet   Oral   Take 1 tablet (10 mg total) by mouth daily before breakfast.   60 tablet   4   . hydrochlorothiazide (HYDRODIURIL) 25 MG tablet   Oral   Take 25 mg by mouth daily.         Marland Kitchen ibuprofen (ADVIL,MOTRIN) 200 MG tablet   Oral   Take 200 mg by mouth every 6 (six) hours as needed for pain. For pain         . lisinopril (PRINIVIL,ZESTRIL) 40 MG tablet   Oral   Take 1 tablet (40 mg total) by mouth daily.   30 tablet   5   . metFORMIN (GLUCOPHAGE) 1000 MG tablet   Oral   Take 1 tablet (1,000 mg total) by mouth 2 (two) times daily with a meal.   60 tablet   6     Rx has expired - unused refills remain   . pravastatin (PRAVACHOL) 20 MG tablet  Oral   Take 1 tablet (20 mg total) by mouth daily.   30 tablet   5     BP 196/79  Pulse 99  Temp(Src) 99.5 F (37.5 C) (Oral)  Resp 20  SpO2 95%  LMP 07/02/1973  Physical Exam  Constitutional: She is oriented to person, place, and time. She appears well-developed.  Morbidly obese.  Does not appear uncomfortable  HENT:  Head: Normocephalic and atraumatic.  L tonsil edematous, erythematous and small amt of exudate.  R tonsil mildly erythematous. Uvula mid-line.  No trismus.   Eyes:  nml appearance  Neck: Normal range of motion. Neck supple.  Cardiovascular: Normal rate and normal heart sounds.   Pulmonary/Chest: Effort normal and breath sounds normal. No respiratory distress.  Musculoskeletal: Normal range of motion.  Lymphadenopathy:    She has no cervical adenopathy.  Neurological: She is alert and oriented to person, place, and time.  Skin: Skin is warm and dry. No pallor.  Psychiatric: She has a normal mood and affect. Her behavior is normal.    ED Course  Procedures (including critical care time)  Labs Reviewed  RAPID STREP SCREEN   No results found.   1. Peritonsillar abscess        MDM  66yo diabetic F presents w/ left-sided sore throat x 3 days.  Exam consistent w/ peritonsillar abscess.   Pt afebrile, is in NAD, is protecting airway, and abscess small.  Received 600mg  IV clindamycin, 10mg  decadron and 4mg  morphine in ED and d/c'd home w/clinda and percocet.  Dr. Lazarus Salines consulted and patient may be seen in f/u tomorrow.  Return precautions discussed. 5:35 AM         Otilio Miu, PA-C 06/23/12 [phone]

## 2012-07-11 ENCOUNTER — Other Ambulatory Visit: Payer: Self-pay | Admitting: *Deleted

## 2012-07-11 MED ORDER — GABAPENTIN 300 MG PO CAPS: ORAL_CAPSULE | ORAL | Status: DC

## 2012-07-24 ENCOUNTER — Other Ambulatory Visit: Payer: Self-pay | Admitting: Internal Medicine

## 2012-07-26 ENCOUNTER — Other Ambulatory Visit: Payer: Self-pay | Admitting: Internal Medicine

## 2012-09-24 ENCOUNTER — Emergency Department (HOSPITAL_COMMUNITY)
Admission: EM | Admit: 2012-09-24 | Discharge: 2012-09-25 | Disposition: A | Discharge status: Home / Self Care | Payer: Medicare HMO | Attending: Emergency Medicine | Admitting: Emergency Medicine

## 2012-09-24 ENCOUNTER — Encounter (HOSPITAL_COMMUNITY): Payer: Self-pay | Admitting: *Deleted

## 2012-09-24 DIAGNOSIS — M949 Disorder of cartilage, unspecified: Secondary | ICD-10-CM | POA: Insufficient documentation

## 2012-09-24 DIAGNOSIS — M899 Disorder of bone, unspecified: Secondary | ICD-10-CM | POA: Insufficient documentation

## 2012-09-24 DIAGNOSIS — L02419 Cutaneous abscess of limb, unspecified: Secondary | ICD-10-CM | POA: Insufficient documentation

## 2012-09-24 DIAGNOSIS — Z888 Allergy status to other drugs, medicaments and biological substances status: Secondary | ICD-10-CM | POA: Insufficient documentation

## 2012-09-24 DIAGNOSIS — I872 Venous insufficiency (chronic) (peripheral): Secondary | ICD-10-CM | POA: Insufficient documentation

## 2012-09-24 DIAGNOSIS — L0390 Cellulitis, unspecified: Secondary | ICD-10-CM

## 2012-09-24 DIAGNOSIS — E1159 Type 2 diabetes mellitus with other circulatory complications: Secondary | ICD-10-CM | POA: Insufficient documentation

## 2012-09-24 DIAGNOSIS — Y998 Other external cause status: Secondary | ICD-10-CM | POA: Insufficient documentation

## 2012-09-24 DIAGNOSIS — Z8719 Personal history of other diseases of the digestive system: Secondary | ICD-10-CM | POA: Insufficient documentation

## 2012-09-24 DIAGNOSIS — I10 Essential (primary) hypertension: Secondary | ICD-10-CM | POA: Insufficient documentation

## 2012-09-24 DIAGNOSIS — E785 Hyperlipidemia, unspecified: Secondary | ICD-10-CM | POA: Insufficient documentation

## 2012-09-24 DIAGNOSIS — Z880 Allergy status to penicillin: Secondary | ICD-10-CM | POA: Insufficient documentation

## 2012-09-24 DIAGNOSIS — Y929 Unspecified place or not applicable: Secondary | ICD-10-CM | POA: Insufficient documentation

## 2012-09-24 DIAGNOSIS — Z79899 Other long term (current) drug therapy: Secondary | ICD-10-CM | POA: Insufficient documentation

## 2012-09-24 DIAGNOSIS — Z91040 Latex allergy status: Secondary | ICD-10-CM | POA: Insufficient documentation

## 2012-09-24 NOTE — ED Notes (Signed)
Pt states that she was bitten by a horse fly 3 days ago, and now tonight the swelling in leg and foot is so bad that she can no longer stand the pain.

## 2012-09-25 MED ORDER — SULFAMETHOXAZOLE-TRIMETHOPRIM 800-160 MG PO TABS: 1.0000 | ORAL_TABLET | Freq: Two times a day (BID) | ORAL | Status: DC

## 2012-09-25 MED ORDER — OXYCODONE-ACETAMINOPHEN 5-325 MG PO TABS: 1.0000 | ORAL_TABLET | ORAL | Status: DC | PRN

## 2012-09-25 NOTE — ED Provider Notes (Addendum)
History    CSN: [id number] Arrival date & time 09/24/12  2301  First MD Initiated Contact with Patient 09/25/12 0005     Chief Complaint  Patient presents with  . Insect Bite   (Consider location/radiation/quality/duration/timing/severity/associated sxs/prior Treatment) HPI Comments: 67 year old female with a history of diabetes and hypertension who has chronic lower extremity swelling presents 3 days after being bitten on the right lower extremity shin by a horse fly. She states that by the time she swatted at it and are a bit her. She then developed gradually a redness to her right lower extremity with 2 blistering lesions which have become erythematous. These are persistent, gradually growing in size and not associated with fevers. She does have pain in the right lower extremity over these 2 wounds which are 1 cm in size each but she also has pain over the right first metatarsal pharyngeal joint.  She denies fevers or chills.  No tachycardia, no vomiting. She is not currently on antibiotics. She has a followup with the podiatrist in the next 3 days to treat her chronic neuropathy  The history is provided by the patient and a relative.   Past Medical History  Diagnosis Date  . Diabetes mellitus   . Hypertension   . Hyperlipidemia   . Osteopenia     DEXA scan 7/07  . Chronic venous insufficiency   . Morbid obesity   . Diverticulitis     history of  . Leg edema     secondary to chronic venous insufficiency  . Cellulitis of left leg     history of, most recent episode 01/09   Past Surgical History  Procedure Laterality Date  . Appendectomy    . Abdominal hysterectomy    . Colectomy      with diverting colsotmy and revision in the 1990's for diverticulitis  . Eye surgery  8/13    left cataract removal & retinal repair   No family history on file. History  Substance Use Topics  . Smoking status: Never Smoker   . Smokeless tobacco: Not on file  . Alcohol Use: No   OB  History   Grav Para Term Preterm Abortions TAB SAB Ect Mult Living                 Review of Systems  All other systems reviewed and are negative.    Allergies  Aspirin; Banana; Hydrocodone; Neomycin-polymyxin-gramicidin; Penicillins; and Latex  Home Medications   Current Outpatient Rx  Name  Route  Sig  Dispense  Refill  . ACCU-CHEK AVIVA PLUS test strip               . amLODipine (NORVASC) 5 MG tablet   Oral   Take 1 tablet (5 mg total) by mouth daily.   30 tablet   11   . clindamycin (CLEOCIN) 300 MG capsule   Oral   Take 1 capsule (300 mg total) by mouth 4 (four) times daily.   28 capsule   0   . diphenhydrAMINE (BENADRYL) 25 MG tablet   Oral   Take 2 tablets (50 mg total) by mouth every 6 (six) hours as needed for itching.         . gabapentin (NEURONTIN) 300 MG capsule      TAKE TWO CAPSULES BY MOUTH THREE TIMES DAILY   90 capsule   5   . glipiZIDE (GLUCOTROL) 10 MG tablet   Oral   Take 1 tablet (10 mg total) by  mouth daily before breakfast.   60 tablet   4   . hydrochlorothiazide (HYDRODIURIL) 25 MG tablet      TAKE 1 TABLET BY MOUTH DAILY   30 tablet   11   . ibuprofen (ADVIL,MOTRIN) 200 MG tablet   Oral   Take 200 mg by mouth every 6 (six) hours as needed for pain. For pain         . lisinopril (PRINIVIL,ZESTRIL) 40 MG tablet      TAKE 1 TABLET BY MOUTH DAILY   30 tablet   11   . metFORMIN (GLUCOPHAGE) 1000 MG tablet      TAKE 1 TABLET BY MOUTH TWICE DAILY WITH A MEAL   60 tablet   6   . oxyCODONE-acetaminophen (PERCOCET) 5-325 MG per tablet   Oral   Take 1 tablet by mouth every 4 (four) hours as needed for pain.   10 tablet   0   . oxyCODONE-acetaminophen (PERCOCET/ROXICET) 5-325 MG per tablet   Oral   Take 1 tablet by mouth every 4 (four) hours as needed for pain.   20 tablet   0   . pravastatin (PRAVACHOL) 20 MG tablet      TAKE 1 TABLET BY MOUTH EVERY DAY   30 tablet   11   . sulfamethoxazole-trimethoprim  (SEPTRA DS) 800-160 MG per tablet   Oral   Take 1 tablet by mouth every 12 (twelve) hours.   20 tablet   0    LMP 07/02/1973 Physical Exam  Nursing note and vitals reviewed. Constitutional: She appears well-developed and well-nourished. No distress.  HENT:  Head: Normocephalic and atraumatic.  Mouth/Throat: Oropharynx is clear and moist. No oropharyngeal exudate.  Eyes: Conjunctivae and EOM are normal. Pupils are equal, round, and reactive to light. Right eye exhibits no discharge. Left eye exhibits no discharge. No scleral icterus.  Neck: Normal range of motion. Neck supple. No JVD present. No thyromegaly present.  Cardiovascular: Normal rate, regular rhythm, normal heart sounds and intact distal pulses.  Exam reveals no gallop and no friction rub.   No murmur heard. Pulmonary/Chest: Effort normal and breath sounds normal. No respiratory distress. She has no wheezes. She has no rales.  Abdominal: Soft. Bowel sounds are normal. She exhibits no distension and no mass. There is no tenderness.  Musculoskeletal: Normal range of motion. She exhibits tenderness ( Mild tenderness to palpation of the right lower extremity anterior shin where there are 2 discrete small-sized erythematous lesions. There is no purulence, no exudate, no fluctuance, no induration.). She exhibits no edema.  Tenderness over the first MTP joint with mild redness, mild pain with range of motion  Lymphadenopathy:    She has no cervical adenopathy.  Neurological: She is alert. Coordination normal.  Skin: Skin is warm and dry. Rash noted. No erythema.  Psychiatric: She has a normal mood and affect. Her behavior is normal.    ED Course  Procedures (including critical care time) Labs Reviewed - No data to display No results found.  1. Cellulitis     MDM  The patient has symptoms and signs consistent with a cellulitis of the right lower extremity at these insect bite areas. She states that the pain skin surrounding  these 2 bites is a normal-appearing lower extra money for her. This is not related to the insect bite. She does have tenderness associated with these areas, they do not appear to be in overt raging cellulitis but may be an early cellulitis. She also  has tenderness over the first MTP which I suspect may be early gout. She does not appear septic, she is well-appearing, can take oral medications and can be discharged home for followup. She has arranged followup already the next 72 hours with a podiatrist. I also encouraged family doctor followup.  Filed Vitals:   09/25/12 0030  BP: 144/70  Pulse: 64  Temp: 97.9 F (36.6 C)  Resp: 18     Meds given in ED:  Medications - No data to display  New Prescriptions   OXYCODONE-ACETAMINOPHEN (PERCOCET) 5-325 MG PER TABLET    Take 1 tablet by mouth every 4 (four) hours as needed for pain.   SULFAMETHOXAZOLE-TRIMETHOPRIM (SEPTRA DS) 800-160 MG PER TABLET    Take 1 tablet by mouth every 12 (twelve) hours.      Vida Roller, MD 09/25/12 [id number]  Vida Roller, MD 09/25/12 [phone]

## 2012-09-29 ENCOUNTER — Other Ambulatory Visit: Payer: Self-pay | Admitting: Internal Medicine

## 2012-09-29 ENCOUNTER — Other Ambulatory Visit: Payer: Self-pay

## 2012-11-25 ENCOUNTER — Encounter: Payer: Self-pay | Admitting: *Deleted

## 2012-11-28 ENCOUNTER — Ambulatory Visit: Payer: Self-pay | Admitting: Internal Medicine

## 2012-11-28 ENCOUNTER — Ambulatory Visit (INDEPENDENT_AMBULATORY_CARE_PROVIDER_SITE_OTHER): Payer: Medicare HMO | Admitting: Internal Medicine

## 2012-11-28 ENCOUNTER — Encounter: Payer: Self-pay | Admitting: Internal Medicine

## 2012-11-28 VITALS — BP 140/70 | HR 61 | Temp 97.5°F | Resp 18 | Ht 63.78 in | Wt 271.2 lb

## 2012-11-28 DIAGNOSIS — R600 Localized edema: Secondary | ICD-10-CM

## 2012-11-28 DIAGNOSIS — I10 Essential (primary) hypertension: Secondary | ICD-10-CM | POA: Insufficient documentation

## 2012-11-28 DIAGNOSIS — I872 Venous insufficiency (chronic) (peripheral): Secondary | ICD-10-CM | POA: Insufficient documentation

## 2012-11-28 DIAGNOSIS — E1149 Type 2 diabetes mellitus with other diabetic neurological complication: Secondary | ICD-10-CM

## 2012-11-28 DIAGNOSIS — M899 Disorder of bone, unspecified: Secondary | ICD-10-CM

## 2012-11-28 DIAGNOSIS — E669 Obesity, unspecified: Secondary | ICD-10-CM | POA: Insufficient documentation

## 2012-11-28 DIAGNOSIS — E785 Hyperlipidemia, unspecified: Secondary | ICD-10-CM

## 2012-11-28 DIAGNOSIS — M8580 Other specified disorders of bone density and structure, unspecified site: Secondary | ICD-10-CM | POA: Insufficient documentation

## 2012-11-28 DIAGNOSIS — E1142 Type 2 diabetes mellitus with diabetic polyneuropathy: Secondary | ICD-10-CM

## 2012-11-28 DIAGNOSIS — Z23 Encounter for immunization: Secondary | ICD-10-CM

## 2012-11-28 DIAGNOSIS — Z0000 Encounter for general adult medical examination without abnormal findings: Secondary | ICD-10-CM

## 2012-11-28 DIAGNOSIS — R609 Edema, unspecified: Secondary | ICD-10-CM

## 2012-11-28 MED ORDER — ATORVASTATIN CALCIUM 20 MG PO TABS: 20.0000 mg | ORAL_TABLET | Freq: Every day | ORAL | Status: DC

## 2012-11-28 NOTE — Patient Instructions (Signed)
Please make an appointment to follow up at [facility] about your left big toe blister/callous and for diabetic shoe fitting.

## 2012-11-28 NOTE — Progress Notes (Signed)
Patient ID: Debra [name], female   DOB: Jun 05, 1945, 67 y.o.   MRN: [id number] Location:  [facility] / [place] [facility]  Code Status: does not have HCPOA or living will   Allergies  Allergen Reactions  . Aspirin Nausea Only    And causes bruises  . Banana Hives  . Hydrocodone Hives  . Neomycin-Polymyxin-Gramicidin Itching and Swelling    Eye drops caused swelling in face and rash and itching on arms and neck  . Penicillins Hives and Itching  . Latex Hives and Rash    Chief Complaint  Patient presents with  . Establish Care    Left great toe large blister, blood clot in leg.    HPI: Patient is a 67 y.o. obese white female seen in the office today to establish with the practice.  Her husband established a couple of weeks ago.  She has superficial phlebitis of her left leg and blood blister of left great toe.  Does not wear compression hose b/c did not help.  Had right leg ulcer that healed--[facility].  Has complete numbness of right foot with diabetic peripheral neuropathy.  Saw [facility].  Gabapentin only helps for a few hours then resolves.    Sugars are staying in the 100s.  Is supposed to check it twice a day.  She is a sweet-a-holic.  Does not like bread though.  Otherwise, eats balanced diet like her husband.  Avoids salt.  Is on metformin and alogliptin/pioglitazone.  Takes lipitor at bedtime.  Does not take an aspirin.  Doesn't take b/c takes too many pills.    Uses benadryl for itching of skin related to contact with animals and also just at bedtime.    Has h/o 2-3 episodes of diverticulitis--had temporary colostomy at one point.    Review of Systems:  Review of Systems  Constitutional: Negative for fever and chills.  HENT: Negative for hearing loss and congestion.   Respiratory: Negative for shortness of breath.   Cardiovascular: Negative for chest pain.  Gastrointestinal: Negative for abdominal pain and  constipation.  Genitourinary: Negative for dysuria.  Musculoskeletal: Negative for falls.  Skin: Negative for rash.  Neurological: Negative for focal weakness and weakness.  Endo/Heme/Allergies: Bruises/bleeds easily.  Psychiatric/Behavioral: Negative for memory loss. The patient is nervous/anxious.      Past Medical History  Diagnosis Date  . Diabetes mellitus   . Hypertension   . Hyperlipidemia   . Osteopenia     DEXA scan 7/07  . Chronic venous insufficiency   . Morbid obesity   . Diverticulitis     history of  . Leg edema     secondary to chronic venous insufficiency  . Cellulitis of left leg     history of, most recent episode 01/09    Past Surgical History  Procedure Laterality Date  . Appendectomy    . Abdominal hysterectomy    . Colectomy      with diverting colsotmy and revision in the 1990's for diverticulitis  . Eye surgery  8/13    left cataract removal & retinal repair    Social History:   reports that she has never smoked. She does not have any smokeless tobacco history on file. She reports that she does not drink alcohol or use illicit drugs.  Family History  Problem Relation Age of Onset  . Heart disease Mother   . Heart disease Father   . Heart disease Brother   .  Cancer Brother   . Cancer Brother   . Hypertension Sister   . Heart disease Sister     Medications: Patient's Medications  New Prescriptions   No medications on file  Previous Medications   ACCU-CHEK AVIVA PLUS TEST STRIP       ALOGLIPTIN-PIOGLITAZONE (OSENI) 12.5-30 MG TABS    Take 1 tablet by mouth daily.   DIPHENHYDRAMINE (BENADRYL) 25 MG TABLET    Take 2 tablets (50 mg total) by mouth every 6 (six) hours as needed for itching.   GABAPENTIN (NEURONTIN) 300 MG CAPSULE    TAKE 2 CAPSULES BY MOUTH THREE TIMES DAILY   HYDROCHLOROTHIAZIDE (HYDRODIURIL) 25 MG TABLET    Take 25 mg by mouth daily.   IBUPROFEN (ADVIL,MOTRIN) 200 MG TABLET    Take 400 mg by mouth every 6 (six) hours as  needed for pain. For pain   LISINOPRIL (PRINIVIL,ZESTRIL) 40 MG TABLET    Take 40 mg by mouth daily.   METFORMIN (GLUCOPHAGE) 500 MG TABLET    Take 1,000 mg by mouth 2 (two) times daily with a meal.   METOPROLOL SUCCINATE (TOPROL-XL) 25 MG 24 HR TABLET    Take 25 mg by mouth daily.   PRAVASTATIN (PRAVACHOL) 20 MG TABLET    Take 20 mg by mouth daily.   SULFAMETHOXAZOLE-TRIMETHOPRIM (SEPTRA DS) 800-160 MG PER TABLET    Take 1 tablet by mouth every 12 (twelve) hours.  Modified Medications   No medications on file  Discontinued Medications   ATORVASTATIN (LIPITOR) 20 MG TABLET    Take 20 mg by mouth at bedtime.   OXYCODONE-ACETAMINOPHEN (PERCOCET) 5-325 MG PER TABLET    Take 1 tablet by mouth every 4 (four) hours as needed for pain.     Physical Exam: Filed Vitals:   11/28/12 0858  BP: 140/70  Pulse: 61  Temp: 97.5 F (36.4 C)  TempSrc: Oral  Resp: 18  Height: 5' 3.78" (1.62 m)  Weight: 271 lb 3.2 oz (123.016 kg)  SpO2: 97%  Physical Exam  Constitutional: She is oriented to person, place, and time.  Obese white female, nad  HENT:  Head: Normocephalic and atraumatic.  Poor dentition, several missing teeth  Eyes: Pupils are equal, round, and reactive to light.  Neck: No JVD present.  Cardiovascular: Normal rate, regular rhythm and normal heart sounds.   Pulmonary/Chest: Effort normal and breath sounds normal.  Abdominal: Soft. Bowel sounds are normal. She exhibits no distension. There is no tenderness.  Musculoskeletal: She exhibits edema.  Neurological: She is alert and oriented to person, place, and time.  Skin: Skin is warm and dry.  Chronic venous stasis changes with purple discoloration of bilateral LEs, scarring on right LE (prior ulceration), left great toe with 1 cm callous with pinpoint blood blister centrally on medial aspect, diminished pulses bilaterally (too much edema)  Psychiatric: She has a normal mood and affect.    Labs reviewed: Basic Metabolic  Panel:  Recent Labs  02/11/12 1032  NA 140  K 4.1  CL 102  CO2 28  GLUCOSE 159*  BUN 20  CREATININE 0.79  CALCIUM 9.5   Lab Results  Component Value Date   HGBA1C 7.4 02/23/2012   Last lipids:  08/08/12:  TG 228, TC 225, LDL137, HDL 42;  Vit D 38 08/12/12:  Wbc 7.7, h/h 14.4/41.4, plts 270;  Na 140, K 4.2, BUN 19, cr 0.75, TSH 4.004, alb 4.7  Past Procedures: DM eye exam:  04/13/12 DM foot exam:  Prior to  today was 12/30/11 Bone density : 09/28/05 Mammogram:  09/23/10  Assessment/Plan 1. Hyperlipidemia LDL goal < 100 -stop pravachol--only needs one statin and lipitor more effective - atorvastatin (LIPITOR) 20 MG tablet; Take 1 tablet (20 mg total) by mouth daily.  Dispense: 90 tablet; Refill: 3  2. Diabetes mellitus with neurological manifestation -not adherent with twice daily glucose checks -notes severe neuropathy of feet - Hemoglobin A1c today (last was 12/13 with previous provider) -on lisinopril already so urine microalbumin not indicated Needs aspirin.  Does not want to take due to bruising.   3. Hypertension goal BP (blood pressure) < 130/80 -not at goal today due to her DMII -will work with her on bp next time--says she is adherent with low sodium diet -cont lisinopril, hctz, metoprolol succinate  4. Osteopenia -has kyphosis--suspect prior compression fxs indicating osteoporosis--last bone density several years ago and does not take regular ca with D - Vitamin D, 25-hydroxy - DG Bone Density; Future  5. Chronic venous insufficiency -not using compression hose as recommended  6. Leg edema -due to #5  7. Need for immunization against influenza - Flu Vaccine QUAD 36+ mos IM  8. Morbid obesity - likes sweets and this is part of her difficulty with glucose control, also cannot walk much due to her neuropathic pain and current callous on her left great toe.   -CBC with Differential - Comprehensive metabolic panel - TSH  9. Encounter for preventive health  examination - MM Digital Screening; Future   Labs/tests ordered: podiatry, [facility], diabetic shoes;  Mammogram, bone density, cbc, cmp, hba1c, vit D, tsh Next appt:  3 mos, plan on scheduling EV and lipid panel

## 2012-11-29 LAB — CBC WITH DIFFERENTIAL/PLATELET
Basophils Absolute: 0 10*3/uL (ref 0.0–0.2)
Basos: 0 % (ref 0–3)
Eos: 5 % (ref 0–5)
Eosinophils Absolute: 0.4 10*3/uL (ref 0.0–0.4)
HCT: 38.4 % (ref 34.0–46.6)
Hemoglobin: 13 g/dL (ref 11.1–15.9)
Immature Grans (Abs): 0 10*3/uL (ref 0.0–0.1)
Immature Granulocytes: 0 % (ref 0–2)
Lymphocytes Absolute: 3 10*3/uL (ref 0.7–3.1)
Lymphs: 38 % (ref 14–46)
MCH: 30.8 pg (ref 26.6–33.0)
MCHC: 33.9 g/dL (ref 31.5–35.7)
MCV: 91 fL (ref 79–97)
Monocytes Absolute: 0.6 10*3/uL (ref 0.1–0.9)
Monocytes: 7 % (ref 4–12)
Neutrophils Absolute: 3.9 10*3/uL (ref 1.4–7.0)
Neutrophils Relative %: 50 % (ref 40–74)
RBC: 4.22 x10E6/uL (ref 3.77–5.28)
RDW: 14.3 % (ref 12.3–15.4)
WBC: 7.9 10*3/uL (ref 3.4–10.8)

## 2012-11-29 LAB — COMPREHENSIVE METABOLIC PANEL
ALT: 17 IU/L (ref 0–32)
AST: 18 IU/L (ref 0–40)
Albumin/Globulin Ratio: 2.2 (ref 1.1–2.5)
Albumin: 4.4 g/dL (ref 3.6–4.8)
Alkaline Phosphatase: 85 IU/L (ref 39–117)
BUN/Creatinine Ratio: 20 (ref 11–26)
BUN: 18 mg/dL (ref 8–27)
CO2: 25 mmol/L (ref 18–29)
Calcium: 9.9 mg/dL (ref 8.6–10.2)
Chloride: 102 mmol/L (ref 97–108)
Creatinine, Ser: 0.91 mg/dL (ref 0.57–1.00)
GFR calc Af Amer: 76 mL/min/{1.73_m2} (ref >59)
GFR calc non Af Amer: 66 mL/min/{1.73_m2} (ref >59)
Globulin, Total: 2 g/dL (ref 1.5–4.5)
Glucose: 129 mg/dL — ABNORMAL HIGH (ref 65–99)
Potassium: 4.6 mmol/L (ref 3.5–5.2)
Sodium: 147 mmol/L — ABNORMAL HIGH (ref 134–144)
Total Bilirubin: 0.2 mg/dL (ref 0.0–1.2)
Total Protein: 6.4 g/dL (ref 6.0–8.5)

## 2012-11-29 LAB — HEMOGLOBIN A1C
Est. average glucose Bld gHb Est-mCnc: 160 mg/dL
Hgb A1c MFr Bld: 7.2 % — ABNORMAL HIGH (ref 4.8–5.6)

## 2012-11-29 LAB — VITAMIN D 25 HYDROXY (VIT D DEFICIENCY, FRACTURES): Vit D, 25-Hydroxy: 28.7 ng/mL — ABNORMAL LOW (ref 30.0–100.0)

## 2012-11-29 LAB — TSH: TSH: 3.63 u[IU]/mL (ref 0.450–4.500)

## 2012-12-19 ENCOUNTER — Other Ambulatory Visit: Payer: Self-pay | Admitting: Internal Medicine

## 2012-12-21 ENCOUNTER — Encounter: Payer: Self-pay | Admitting: Internal Medicine

## 2013-01-19 ENCOUNTER — Encounter: Payer: Self-pay | Admitting: Nurse Practitioner

## 2013-01-19 ENCOUNTER — Ambulatory Visit (INDEPENDENT_AMBULATORY_CARE_PROVIDER_SITE_OTHER): Payer: Commercial Managed Care - HMO | Admitting: Nurse Practitioner

## 2013-01-19 VITALS — BP 152/80 | HR 71 | Temp 98.5°F | Resp 20 | Ht 63.0 in | Wt 270.8 lb

## 2013-01-19 DIAGNOSIS — R197 Diarrhea, unspecified: Secondary | ICD-10-CM

## 2013-01-19 DIAGNOSIS — K219 Gastro-esophageal reflux disease without esophagitis: Secondary | ICD-10-CM

## 2013-01-19 MED ORDER — OMEPRAZOLE 20 MG PO CPDR: 20.0000 mg | DELAYED_RELEASE_CAPSULE | Freq: Every day | ORAL | Status: DC

## 2013-01-19 NOTE — Progress Notes (Signed)
Patient ID: Debra [name], female   DOB: 1946/02/10, 67 y.o.   MRN: [id number]   Allergies  Allergen Reactions  . Aspirin Nausea Only    And causes bruises  . Banana Hives  . Hydrocodone Hives  . Neomycin-Polymyxin-Gramicidin Itching and Swelling    Eye drops caused swelling in face and rash and itching on arms and neck  . Penicillins Hives and Itching  . Latex Hives and Rash    Chief Complaint  Patient presents with  . Acute Visit    severe diarrhea    HPI: Patient is a 67 y.o. female seen in the office today for diarrhea for 2 weeks; off and on for 3 or 4 months but would not be constant; Not been on antibiotics recently, no changes in medication or changes in doses recently, has not travelled nothing out of the ordinary; taking antidiarrhea which helps for a few hours; no fevers or chills, No abdominal pain, no blood in her stools, no episodes of incontinences, decrease appetite but no pain after eating.  Hx of diverticulitis- does not feel like that. Still has gallbladder, but No association with eating Review of Systems:  Review of Systems  Constitutional: Negative for fever, chills and malaise/fatigue.  Respiratory: Negative for shortness of breath.   Cardiovascular: Negative for chest pain.  Gastrointestinal: Positive for heartburn (all the time), nausea (occasionally ) and diarrhea. Negative for vomiting and abdominal pain.  Genitourinary: Negative for dysuria, urgency and frequency.  Skin: Negative.   Neurological: Negative for weakness.     Past Medical History  Diagnosis Date  . Diabetes mellitus with neurological manifestation   . Hypertension goal BP (blood pressure) < 130/80   . Hyperlipidemia LDL goal < 100   . Osteopenia     DEXA scan 7/07  . Chronic venous insufficiency   . Morbid obesity   . Diverticulitis     h/o 2-3 episodes in past  . Leg edema     secondary to chronic venous insufficiency  . Cellulitis of left leg     history of, most recent  episode 01/09   Past Surgical History  Procedure Laterality Date  . Appendectomy    . Abdominal hysterectomy    . Colectomy      with diverting colsotmy and revision in the 1990's for diverticulitis  . Eye surgery  8/13    left cataract removal & retinal repair   Social History:   reports that she has never smoked. She does not have any smokeless tobacco history on file. She reports that she does not drink alcohol or use illicit drugs.  Family History  Problem Relation Age of Onset  . Heart disease Mother   . Heart disease Father   . Heart disease Brother   . Cancer Brother   . Cancer Brother   . Hypertension Sister   . Heart disease Sister     Medications: Patient's Medications  New Prescriptions   No medications on file  Previous Medications   ACCU-CHEK AVIVA PLUS TEST STRIP       ALOGLIPTIN-PIOGLITAZONE (OSENI) 12.5-30 MG TABS    Take 1 tablet by mouth daily.   ATORVASTATIN (LIPITOR) 20 MG TABLET    Take 1 tablet (20 mg total) by mouth daily.   DIPHENHYDRAMINE (BENADRYL) 25 MG TABLET    Take 2 tablets (50 mg total) by mouth every 6 (six) hours as needed for itching.   GABAPENTIN (NEURONTIN) 300 MG CAPSULE    TAKE 2 CAPSULES BY  MOUTH THREE TIMES DAILY   HYDROCHLOROTHIAZIDE (HYDRODIURIL) 25 MG TABLET    Take 25 mg by mouth daily.   IBUPROFEN (ADVIL,MOTRIN) 200 MG TABLET    Take 400 mg by mouth every 6 (six) hours as needed for pain. For pain   LISINOPRIL (PRINIVIL,ZESTRIL) 40 MG TABLET    Take 40 mg by mouth daily.   LOPERAMIDE (IMODIUM) 2 MG CAPSULE    Take 2 mg by mouth as needed for diarrhea or loose stools.   METFORMIN (GLUCOPHAGE) 500 MG TABLET    Take 1,000 mg by mouth 2 (two) times daily with a meal.   METOPROLOL SUCCINATE (TOPROL-XL) 25 MG 24 HR TABLET    Take 25 mg by mouth daily.   PIOGLITAZONE (ACTOS) 30 MG TABLET      Modified Medications   No medications on file  Discontinued Medications   No medications on file     Physical Exam:  Filed Vitals:    01/19/13 1352  BP: 152/80  Pulse: 71  Temp: 98.5 F (36.9 C)  TempSrc: Oral  Resp: 20  Height: 5\' 3"  (1.6 m)  Weight: 270 lb 12.8 oz (122.834 kg)  SpO2: 95%    Physical Exam  Constitutional: She is well-developed, well-nourished, and in no distress. No distress.  Cardiovascular: Normal rate, regular rhythm and normal heart sounds.   Pulmonary/Chest: Effort normal and breath sounds normal. No respiratory distress.  Abdominal: Soft. Normal appearance and bowel sounds are normal. She exhibits no distension and no mass. There is no tenderness. There is no rebound and no guarding.  obese  Genitourinary: Rectum normal.  Skin: She is not diaphoretic.     Labs reviewed: Basic Metabolic Panel:  Recent Labs  11/91/47 1032 11/28/12 1054  NA 140 147*  K 4.1 4.6  CL 102 102  CO2 28 25  GLUCOSE 159* 129*  BUN 20 18  CREATININE 0.79 0.91  CALCIUM 9.5 9.9  TSH  --  3.630   Liver Function Tests:  Recent Labs  11/28/12 1054  AST 18  ALT 17  ALKPHOS 85  BILITOT 0.2  PROT 6.4   No results found for this basename: LIPASE, AMYLASE,  in the last 8760 hours No results found for this basename: AMMONIA,  in the last 8760 hours CBC:  Recent Labs  11/28/12 1054  WBC 7.9  NEUTROABS 3.9  HGB 13.0  HCT 38.4  MCV 91   Lipid Panel: No results found for this basename: CHOL, HDL, LDLCALC, TRIG, CHOLHDL, LDLDIRECT,  in the last 8760 hours TSH:  Recent Labs  11/28/12 1054  TSH 3.630     Assessment/Plan 1. Diarrhea -bland diet advanced as tolerated -avoid artifical sweeter -benefiber 1-2 times daily - CBC With differential/Platelet - Comprehensive metabolic panel - Amylase - Lipase -instructions given on when to follow up and seek emerency care; pt understand   2. GERD (gastroesophageal reflux disease) -avoid trigger food; diet modifications  - omeprazole (PRILOSEC) 20 MG capsule; Take 1 capsule (20 mg total) by mouth daily.  Dispense: 30 capsule; Refill: 3

## 2013-01-19 NOTE — Patient Instructions (Signed)
Will get blood work  Take benefiber twice daily to help with diarrhea  Increase fluid intake while having diarrhea  Bland foods and advance as tolerated  Will have you start taking omeprazole daily for heartburn/acid reflux  Diarrhea Diarrhea is frequent loose and watery bowel movements. It can cause you to feel weak and dehydrated. Dehydration can cause you to become tired and thirsty, have a dry mouth, and have decreased urination that often is dark yellow. Diarrhea is a sign of another problem, most often an infection that will not last long. In most cases, diarrhea typically lasts 2 3 days. However, it can last longer if it is a sign of something more serious. It is important to treat your diarrhea as directed by your caregive to lessen or prevent future episodes of diarrhea. CAUSES  Some common causes include:  Gastrointestinal infections caused by viruses, bacteria, or parasites.  Food poisoning or food allergies.  Certain medicines, such as antibiotics, chemotherapy, and laxatives.  Artificial sweeteners and fructose.  Digestive disorders. HOME CARE INSTRUCTIONS  Ensure adequate fluid intake (hydration): have 1 cup (8 oz) of fluid for each diarrhea episode. Avoid fluids that contain simple sugars or sports drinks, fruit juices, whole milk products, and sodas. Your urine should be clear or pale yellow if you are drinking enough fluids. Hydrate with an oral rehydration solution that you can purchase at pharmacies, retail stores, and online. You can prepare an oral rehydration solution at home by mixing the following ingredients together:    tsp table salt.   tsp baking soda.   tsp salt substitute containing potassium chloride.  1  tablespoons sugar.  1 L (34 oz) of water.  Certain foods and beverages may increase the speed at which food moves through the gastrointestinal (GI) tract. These foods and beverages should be avoided and include:  Caffeinated and alcoholic  beverages.  High-fiber foods, such as raw fruits and vegetables, nuts, seeds, and whole grain breads and cereals.  Foods and beverages sweetened with sugar alcohols, such as xylitol, sorbitol, and mannitol.  Some foods may be well tolerated and may help thicken stool including:  Starchy foods, such as rice, toast, pasta, low-sugar cereal, oatmeal, grits, baked potatoes, crackers, and bagels.  Bananas.  Applesauce.  Add probiotic-rich foods to help increase healthy bacteria in the GI tract, such as yogurt and fermented milk products.  Wash your hands well after each diarrhea episode.  Only take over-the-counter or prescription medicines as directed by your caregiver.  Take a warm bath to relieve any burning or pain from frequent diarrhea episodes. SEEK IMMEDIATE MEDICAL CARE IF:   You are unable to keep fluids down.  You have persistent vomiting.  You have blood in your stool, or your stools are black and tarry.  You do not urinate in 6 8 hours, or there is only a small amount of very dark urine.  You have abdominal pain that increases or localizes.  You have weakness, dizziness, confusion, or lightheadedness.  You have a severe headache.  Your diarrhea gets worse or does not get better.  You have a fever or persistent symptoms for more than 2 3 days.  You have a fever and your symptoms suddenly get worse. MAKE SURE YOU:   Understand these instructions.  Will watch your condition.  Will get help right away if you are not doing well or get worse. Document Released: 02/27/2002 Document Revised: 02/24/2012 Document Reviewed: 11/15/2011 [facility] Patient Information 2014 [place], [place].

## 2013-01-20 ENCOUNTER — Encounter: Payer: Self-pay | Admitting: *Deleted

## 2013-01-20 LAB — COMPREHENSIVE METABOLIC PANEL
ALT: 14 IU/L (ref 0–32)
AST: 15 IU/L (ref 0–40)
Albumin/Globulin Ratio: 1.8 (ref 1.1–2.5)
CO2: 28 mmol/L (ref 18–29)
Calcium: 9.9 mg/dL (ref 8.6–10.2)
Chloride: 100 mmol/L (ref 97–108)
Glucose: 181 mg/dL — ABNORMAL HIGH (ref 65–99)
Potassium: 4 mmol/L (ref 3.5–5.2)
Sodium: 144 mmol/L (ref 134–144)

## 2013-01-20 LAB — CBC WITH DIFFERENTIAL
Basophils Absolute: 0 10*3/uL (ref 0.0–0.2)
Eosinophils Absolute: 0.3 10*3/uL (ref 0.0–0.4)
Immature Grans (Abs): 0 10*3/uL (ref 0.0–0.1)
Immature Granulocytes: 0 %
Lymphs: 33 %
MCH: 31.2 pg (ref 26.6–33.0)
MCHC: 33.9 g/dL (ref 31.5–35.7)
Monocytes Absolute: 0.6 10*3/uL (ref 0.1–0.9)
Neutrophils Relative %: 55 %
Platelets: 247 10*3/uL (ref 150–379)

## 2013-01-20 LAB — AMYLASE: Amylase: 52 U/L (ref 31–124)

## 2013-02-12 ENCOUNTER — Other Ambulatory Visit: Payer: Self-pay | Admitting: Internal Medicine

## 2013-02-14 ENCOUNTER — Other Ambulatory Visit: Payer: Self-pay | Admitting: Internal Medicine

## 2013-03-03 ENCOUNTER — Ambulatory Visit: Payer: Medicare HMO | Admitting: Internal Medicine

## 2013-03-11 ENCOUNTER — Other Ambulatory Visit: Payer: Self-pay | Admitting: Internal Medicine

## 2013-03-14 ENCOUNTER — Ambulatory Visit (INDEPENDENT_AMBULATORY_CARE_PROVIDER_SITE_OTHER): Payer: Medicare Other | Admitting: Ophthalmology

## 2013-04-04 ENCOUNTER — Ambulatory Visit (INDEPENDENT_AMBULATORY_CARE_PROVIDER_SITE_OTHER): Payer: Commercial Managed Care - HMO | Admitting: Internal Medicine

## 2013-04-04 ENCOUNTER — Encounter: Payer: Self-pay | Admitting: Internal Medicine

## 2013-04-04 VITALS — BP 142/78 | HR 66 | Temp 97.6°F | Wt 279.4 lb

## 2013-04-04 DIAGNOSIS — G5793 Unspecified mononeuropathy of bilateral lower limbs: Secondary | ICD-10-CM

## 2013-04-04 DIAGNOSIS — E1149 Type 2 diabetes mellitus with other diabetic neurological complication: Secondary | ICD-10-CM

## 2013-04-04 DIAGNOSIS — G609 Hereditary and idiopathic neuropathy, unspecified: Secondary | ICD-10-CM

## 2013-04-04 MED ORDER — GABAPENTIN 300 MG PO CAPS: ORAL_CAPSULE | ORAL | Status: DC

## 2013-04-04 MED ORDER — PREGABALIN 75 MG PO CAPS
75.0000 mg | ORAL_CAPSULE | Freq: Three times a day (TID) | ORAL | Status: DC
Start: 2013-04-04 — End: 2013-07-26

## 2013-04-04 MED ORDER — CAPSAICIN-MENTHOL-METHYL SAL 0.025-1-12 % EX CREA: 1.0000 | TOPICAL_CREAM | Freq: Four times a day (QID) | CUTANEOUS | Status: DC

## 2013-04-04 NOTE — Progress Notes (Signed)
Patient ID: Debra [name], female   DOB: 07/04/1945, 68 y.o.   MRN: [id number]    Chief Complaint  Patient presents with  . Leg Pain  . Foot Pain   HPI 68 y/o female pt is here for acute visit with bilateral foot and leg pain. She has diabetes and peripheral neuropathy. She is on gabapentin 600 mg tid and has been taking it qid for now without help. No other complaints. Denies claudication. Would like a handicap sticker for travelling  ROS No fever or chills No abdominal pain , nausea or vomiting No chest pain, or SOB No headache or blurry vision  Past Medical History  Diagnosis Date  . Diabetes mellitus with neurological manifestation   . Hypertension goal BP (blood pressure) < 130/80   . Hyperlipidemia LDL goal < 100   . Osteopenia     DEXA scan 7/07  . Chronic venous insufficiency   . Morbid obesity   . Diverticulitis     h/o 2-3 episodes in past  . Leg edema     secondary to chronic venous insufficiency  . Cellulitis of left leg     history of, most recent episode 01/09   Medication reviewed. See [facility]  Physical exam BP 142/78  Pulse 66  Temp(Src) 97.6 F (36.4 C) (Oral)  Wt 279 lb 6.4 oz (126.735 kg)  SpO2 98%  LMP 07/02/1973  gen- obese female in NAD cvs- normal s1,s2, rrr respi- CTAB abdo- bs+, soft, non tender Ext- able to move all 4, diminished pedal pulses and cool extremities, hair loss in both legs, trace edema, callus on left big toe.  Neuro- no pinprick sensation on both great toes  Labs Lab Results  Component Value Date   HGBA1C 7.2* 11/28/2012    Assessment/plan  1. Neuropathic pain of both legs With gabapentin not working for her, will taper this slowly. Provided tapered dose script. Will start her on lyrica 75 mg tid for now and reassess in a month.  2. Diabetes mellitus with neurological manifestation Monitor cbg, continue current dm regimen. Recommended diabetes shoes - Hemoglobin A1c; Future

## 2013-04-13 ENCOUNTER — Inpatient Hospital Stay (HOSPITAL_COMMUNITY)
Admission: EM | Admit: 2013-04-13 | Discharge: 2013-04-19 | DRG: 418 | Disposition: A | Discharge status: Home / Self Care | Payer: Medicare HMO | Attending: General Surgery | Admitting: General Surgery

## 2013-04-13 ENCOUNTER — Emergency Department (HOSPITAL_COMMUNITY): Payer: Medicare HMO

## 2013-04-13 ENCOUNTER — Encounter (HOSPITAL_COMMUNITY): Payer: Self-pay | Admitting: Emergency Medicine

## 2013-04-13 DIAGNOSIS — K8020 Calculus of gallbladder without cholecystitis without obstruction: Secondary | ICD-10-CM

## 2013-04-13 DIAGNOSIS — M949 Disorder of cartilage, unspecified: Secondary | ICD-10-CM

## 2013-04-13 DIAGNOSIS — E1165 Type 2 diabetes mellitus with hyperglycemia: Secondary | ICD-10-CM

## 2013-04-13 DIAGNOSIS — E1149 Type 2 diabetes mellitus with other diabetic neurological complication: Secondary | ICD-10-CM | POA: Diagnosis present

## 2013-04-13 DIAGNOSIS — K660 Peritoneal adhesions (postprocedural) (postinfection): Secondary | ICD-10-CM | POA: Diagnosis present

## 2013-04-13 DIAGNOSIS — IMO0002 Reserved for concepts with insufficient information to code with codable children: Secondary | ICD-10-CM

## 2013-04-13 DIAGNOSIS — I872 Venous insufficiency (chronic) (peripheral): Secondary | ICD-10-CM | POA: Diagnosis present

## 2013-04-13 DIAGNOSIS — E785 Hyperlipidemia, unspecified: Secondary | ICD-10-CM | POA: Diagnosis present

## 2013-04-13 DIAGNOSIS — R932 Abnormal findings on diagnostic imaging of liver and biliary tract: Secondary | ICD-10-CM

## 2013-04-13 DIAGNOSIS — R748 Abnormal levels of other serum enzymes: Secondary | ICD-10-CM | POA: Diagnosis present

## 2013-04-13 DIAGNOSIS — E876 Hypokalemia: Secondary | ICD-10-CM | POA: Diagnosis not present

## 2013-04-13 DIAGNOSIS — R109 Unspecified abdominal pain: Secondary | ICD-10-CM

## 2013-04-13 DIAGNOSIS — E1142 Type 2 diabetes mellitus with diabetic polyneuropathy: Secondary | ICD-10-CM | POA: Diagnosis present

## 2013-04-13 DIAGNOSIS — K219 Gastro-esophageal reflux disease without esophagitis: Secondary | ICD-10-CM | POA: Diagnosis present

## 2013-04-13 DIAGNOSIS — I10 Essential (primary) hypertension: Secondary | ICD-10-CM | POA: Diagnosis present

## 2013-04-13 DIAGNOSIS — Z86718 Personal history of other venous thrombosis and embolism: Secondary | ICD-10-CM

## 2013-04-13 DIAGNOSIS — K8050 Calculus of bile duct without cholangitis or cholecystitis without obstruction: Secondary | ICD-10-CM

## 2013-04-13 DIAGNOSIS — K810 Acute cholecystitis: Secondary | ICD-10-CM

## 2013-04-13 DIAGNOSIS — K8042 Calculus of bile duct with acute cholecystitis without obstruction: Secondary | ICD-10-CM | POA: Diagnosis present

## 2013-04-13 DIAGNOSIS — K8067 Calculus of gallbladder and bile duct with acute and chronic cholecystitis with obstruction: Principal | ICD-10-CM | POA: Diagnosis present

## 2013-04-13 DIAGNOSIS — R112 Nausea with vomiting, unspecified: Secondary | ICD-10-CM | POA: Diagnosis present

## 2013-04-13 DIAGNOSIS — E669 Obesity, unspecified: Secondary | ICD-10-CM

## 2013-04-13 DIAGNOSIS — R945 Abnormal results of liver function studies: Secondary | ICD-10-CM

## 2013-04-13 DIAGNOSIS — R17 Unspecified jaundice: Secondary | ICD-10-CM | POA: Diagnosis present

## 2013-04-13 DIAGNOSIS — R7989 Other specified abnormal findings of blood chemistry: Secondary | ICD-10-CM

## 2013-04-13 DIAGNOSIS — Z8249 Family history of ischemic heart disease and other diseases of the circulatory system: Secondary | ICD-10-CM

## 2013-04-13 DIAGNOSIS — Z6841 Body Mass Index (BMI) 40.0 and over, adult: Secondary | ICD-10-CM

## 2013-04-13 DIAGNOSIS — E1140 Type 2 diabetes mellitus with diabetic neuropathy, unspecified: Secondary | ICD-10-CM

## 2013-04-13 DIAGNOSIS — M899 Disorder of bone, unspecified: Secondary | ICD-10-CM | POA: Diagnosis present

## 2013-04-13 LAB — LIPASE, BLOOD: Lipase: 28 U/L (ref 11–59)

## 2013-04-13 LAB — COMPREHENSIVE METABOLIC PANEL
ALK PHOS: 78 U/L (ref 39–117)
ALT: 16 U/L (ref 0–35)
AST: 15 U/L (ref 0–37)
Albumin: 3.7 g/dL (ref 3.5–5.2)
BILIRUBIN TOTAL: 0.4 mg/dL (ref 0.3–1.2)
BUN: 22 mg/dL (ref 6–23)
CHLORIDE: 100 meq/L (ref 96–112)
CO2: 28 meq/L (ref 19–32)
Calcium: 9.2 mg/dL (ref 8.4–10.5)
Creatinine, Ser: 0.76 mg/dL (ref 0.50–1.10)
GFR, EST NON AFRICAN AMERICAN: 86 mL/min — AB (ref >90)
Glucose, Bld: 165 mg/dL — ABNORMAL HIGH (ref 70–99)
POTASSIUM: 3.9 meq/L (ref 3.7–5.3)
Sodium: 141 mEq/L (ref 137–147)
Total Protein: 7 g/dL (ref 6.0–8.3)

## 2013-04-13 LAB — URINALYSIS, ROUTINE W REFLEX MICROSCOPIC
BILIRUBIN URINE: NEGATIVE
Glucose, UA: NEGATIVE mg/dL
Hgb urine dipstick: NEGATIVE
KETONES UR: NEGATIVE mg/dL
Nitrite: NEGATIVE
Protein, ur: NEGATIVE mg/dL
Specific Gravity, Urine: 1.011 (ref 1.005–1.030)
UROBILINOGEN UA: 0.2 mg/dL (ref 0.0–1.0)
pH: 7 (ref 5.0–8.0)

## 2013-04-13 LAB — CBC WITH DIFFERENTIAL/PLATELET
Basophils Absolute: 0 10*3/uL (ref 0.0–0.1)
Basophils Relative: 0 % (ref 0–1)
EOS ABS: 0.3 10*3/uL (ref 0.0–0.7)
Eosinophils Relative: 4 % (ref 0–5)
HCT: 38.2 % (ref 36.0–46.0)
Hemoglobin: 12.9 g/dL (ref 12.0–15.0)
LYMPHS ABS: 2.4 10*3/uL (ref 0.7–4.0)
Lymphocytes Relative: 35 % (ref 12–46)
MCH: 31.4 pg (ref 26.0–34.0)
MCHC: 33.8 g/dL (ref 30.0–36.0)
MCV: 92.9 fL (ref 78.0–100.0)
MONOS PCT: 12 % (ref 3–12)
Monocytes Absolute: 0.9 10*3/uL (ref 0.1–1.0)
NEUTROS ABS: 3.4 10*3/uL (ref 1.7–7.7)
Neutrophils Relative %: 49 % (ref 43–77)
Platelets: 203 10*3/uL (ref 150–400)
RBC: 4.11 MIL/uL (ref 3.87–5.11)
RDW: 13.6 % (ref 11.5–15.5)
WBC: 7 10*3/uL (ref 4.0–10.5)

## 2013-04-13 LAB — HEMOGLOBIN A1C
HEMOGLOBIN A1C: 7 % — AB (ref <5.7)
Mean Plasma Glucose: 154 mg/dL — ABNORMAL HIGH (ref <117)

## 2013-04-13 LAB — URINE MICROSCOPIC-ADD ON

## 2013-04-13 MED ORDER — SODIUM CHLORIDE 0.9 % IV BOLUS (SEPSIS)
1000.0000 mL | Freq: Once | INTRAVENOUS | Status: AC
Start: 2013-04-13 — End: 2013-04-13
  Administered 2013-04-13: 1000 mL via INTRAVENOUS

## 2013-04-13 MED ORDER — ONDANSETRON HCL 4 MG/2ML IJ SOLN
4.0000 mg | Freq: Four times a day (QID) | INTRAMUSCULAR | Status: DC | PRN
  Administered 2013-04-14 – 2013-04-16 (×4): 4 mg via INTRAVENOUS
  Filled 2013-04-13 (×3): qty 2

## 2013-04-13 MED ORDER — POTASSIUM CHLORIDE IN NACL 20-0.9 MEQ/L-% IV SOLN
INTRAVENOUS | Status: DC
  Administered 2013-04-13 – 2013-04-14 (×2): via INTRAVENOUS
  Filled 2013-04-13 (×4): qty 1000

## 2013-04-13 MED ORDER — DIPHENHYDRAMINE HCL 12.5 MG/5ML PO ELIX: 12.5000 mg | ORAL_SOLUTION | Freq: Four times a day (QID) | ORAL | Status: DC | PRN

## 2013-04-13 MED ORDER — HEPARIN SODIUM (PORCINE) 5000 UNIT/ML IJ SOLN
5000.0000 [IU] | Freq: Three times a day (TID) | INTRAMUSCULAR | Status: DC
  Administered 2013-04-13 – 2013-04-19 (×15): 5000 [IU] via SUBCUTANEOUS
  Filled 2013-04-13 (×21): qty 1

## 2013-04-13 MED ORDER — MORPHINE SULFATE 4 MG/ML IJ SOLN
4.0000 mg | Freq: Once | INTRAMUSCULAR | Status: AC
  Administered 2013-04-13: 4 mg via INTRAVENOUS
  Filled 2013-04-13: qty 1

## 2013-04-13 MED ORDER — PANTOPRAZOLE SODIUM 40 MG PO TBEC: 40.0000 mg | DELAYED_RELEASE_TABLET | Freq: Every day | ORAL | Status: DC

## 2013-04-13 MED ORDER — METOPROLOL SUCCINATE ER 25 MG PO TB24
25.0000 mg | ORAL_TABLET | Freq: Every day | ORAL | Status: DC
  Administered 2013-04-13 – 2013-04-19 (×6): 25 mg via ORAL
  Filled 2013-04-13 (×7): qty 1

## 2013-04-13 MED ORDER — ACETAMINOPHEN 650 MG RE SUPP: 650.0000 mg | Freq: Four times a day (QID) | RECTAL | Status: DC | PRN

## 2013-04-13 MED ORDER — CAPSICUM OLEORESIN 0.025 % EX CREA
TOPICAL_CREAM | Freq: Four times a day (QID) | CUTANEOUS | Status: DC
  Administered 2013-04-15 – 2013-04-18 (×12): via TOPICAL
  Filled 2013-04-13: qty 60

## 2013-04-13 MED ORDER — PANTOPRAZOLE SODIUM 40 MG PO TBEC
40.0000 mg | DELAYED_RELEASE_TABLET | Freq: Every day | ORAL | Status: DC
  Administered 2013-04-13 – 2013-04-19 (×6): 40 mg via ORAL
  Filled 2013-04-13 (×7): qty 1

## 2013-04-13 MED ORDER — ACETAMINOPHEN 500 MG PO TABS
1000.0000 mg | ORAL_TABLET | Freq: Once | ORAL | Status: AC
  Administered 2013-04-13: 1000 mg via ORAL
  Filled 2013-04-13: qty 2

## 2013-04-13 MED ORDER — CIPROFLOXACIN IN D5W 400 MG/200ML IV SOLN
400.0000 mg | Freq: Two times a day (BID) | INTRAVENOUS | Status: DC
  Administered 2013-04-13 – 2013-04-19 (×12): 400 mg via INTRAVENOUS
  Filled 2013-04-13 (×15): qty 200

## 2013-04-13 MED ORDER — CAPSAICIN-MENTHOL-METHYL SAL 0.025-1-12 % EX CREA: 1.0000 | TOPICAL_CREAM | Freq: Four times a day (QID) | CUTANEOUS | Status: DC

## 2013-04-13 MED ORDER — ATORVASTATIN CALCIUM 20 MG PO TABS
20.0000 mg | ORAL_TABLET | Freq: Every day | ORAL | Status: DC
  Administered 2013-04-13: 20 mg via ORAL
  Filled 2013-04-13 (×2): qty 1

## 2013-04-13 MED ORDER — HYDROMORPHONE HCL PF 1 MG/ML IJ SOLN
0.5000 mg | INTRAMUSCULAR | Status: DC | PRN
  Administered 2013-04-13: 1 mg via INTRAVENOUS
  Administered 2013-04-14 – 2013-04-15 (×3): 0.5 mg via INTRAVENOUS
  Administered 2013-04-16: 1 mg via INTRAVENOUS
  Administered 2013-04-17: 0.5 mg via INTRAVENOUS
  Filled 2013-04-13 (×7): qty 1

## 2013-04-13 MED ORDER — ONDANSETRON HCL 4 MG/2ML IJ SOLN
4.0000 mg | Freq: Once | INTRAMUSCULAR | Status: AC
Start: 2013-04-13 — End: 2013-04-13
  Administered 2013-04-13: 4 mg via INTRAVENOUS
  Filled 2013-04-13: qty 2

## 2013-04-13 MED ORDER — PREGABALIN 75 MG PO CAPS
75.0000 mg | ORAL_CAPSULE | Freq: Three times a day (TID) | ORAL | Status: DC
  Administered 2013-04-13 – 2013-04-19 (×15): 75 mg via ORAL
  Filled 2013-04-13 (×15): qty 1

## 2013-04-13 MED ORDER — INSULIN ASPART 100 UNIT/ML ~~LOC~~ SOLN
0.0000 [IU] | Freq: Three times a day (TID) | SUBCUTANEOUS | Status: DC
  Administered 2013-04-14: 3 [IU] via SUBCUTANEOUS
  Administered 2013-04-15: 5 [IU] via SUBCUTANEOUS
  Administered 2013-04-15 (×2): 3 [IU] via SUBCUTANEOUS
  Administered 2013-04-16: 5 [IU] via SUBCUTANEOUS
  Administered 2013-04-16: 3 [IU] via SUBCUTANEOUS

## 2013-04-13 MED ORDER — DIPHENHYDRAMINE HCL 50 MG/ML IJ SOLN: 12.5000 mg | Freq: Four times a day (QID) | INTRAMUSCULAR | Status: DC | PRN

## 2013-04-13 MED ORDER — LISINOPRIL 40 MG PO TABS
40.0000 mg | ORAL_TABLET | Freq: Every day | ORAL | Status: DC
  Administered 2013-04-13 – 2013-04-19 (×6): 40 mg via ORAL
  Filled 2013-04-13 (×7): qty 1

## 2013-04-13 MED ORDER — ACETAMINOPHEN-CODEINE #3 300-30 MG PO TABS
1.0000 | ORAL_TABLET | ORAL | Status: DC | PRN
  Administered 2013-04-15: 2 via ORAL
  Administered 2013-04-15: 1 via ORAL
  Administered 2013-04-16 (×2): 2 via ORAL
  Administered 2013-04-17 – 2013-04-18 (×3): 1 via ORAL
  Filled 2013-04-13: qty 2
  Filled 2013-04-13 (×2): qty 1
  Filled 2013-04-13 (×2): qty 2
  Filled 2013-04-13 (×2): qty 1
  Filled 2013-04-13: qty 2

## 2013-04-13 MED ORDER — HYDROCHLOROTHIAZIDE 25 MG PO TABS
25.0000 mg | ORAL_TABLET | Freq: Every day | ORAL | Status: DC
  Administered 2013-04-13 – 2013-04-19 (×6): 25 mg via ORAL
  Filled 2013-04-13 (×7): qty 1

## 2013-04-13 MED ORDER — ACETAMINOPHEN 325 MG PO TABS: 650.0000 mg | ORAL_TABLET | Freq: Four times a day (QID) | ORAL | Status: DC | PRN

## 2013-04-13 NOTE — Progress Notes (Signed)
UR completed 

## 2013-04-13 NOTE — Consult Note (Signed)
[facility] Medical Consultation  Debra [name] [id number] DOB: 1945-12-11 DOA: 04/13/2013 PCP: Delia Chimes, NP   Requesting physician: Earnstine Regal Date of consultation: 04/13/2013 Reason for consultation: Medical management  Impression/Recommendations Active Problems:   Symptomatic cholelithiasis hypertension -Continue HCTZ, lisinopril and metoprolol succinate -If the patient is n.p.o. 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 Please contact me if I can be of assistance in the meanwhile. Thank you for this consultation.  Chief Complaint: Abdominal pain  HPI:  68 year old female with a history of hypertension, but is not as, hyperlipidemia, GERD, diverticulitis presents with one-day history of right upper quadrant, right lower rib pain. On the day prior to admission, the patient experienced pain in the area described. She ate breakfast without any worsening or improvement of the pain. Later in the day, her pain improved. In fact, she was able to eat dinner on the night prior to admission without any difficulty. She had some nausea without emesis. However, the patient woke up around 3 AM on the morning of admission with sharp right upper quadrant, right lower rib pain. She complains of chronic soft stools without any hematochezia or melena. There is been no fevers, chills, chest pain, shortness breath, vomiting, dysuria, hematuria. Urinalysis in the ED was negative for pyuria. Abdominal ultrasound showed cholelithiasis with borderline  gallbladder wall thickening and mild extrahepatic ductal dilatation. The patient was started on Cipro and Flagyl. She is afebrile and hemodynamically stable.  Review of Systems:  Constitutional:  No weight loss, night sweats, Fevers, chills, fatigue.  Head&Eyes: No headache.  No vision loss.   ENT:  No Difficulty swallowing,Tooth/dental problems,Sore throat,   Cardio-vascular:  No chest pain, Orthopnea, PND, swelling in lower extremities,  dizziness, palpitations  GI:  No heartburn, indigestion, vomiting, diarrhea, loss of appetite, hematochezia, melena Resp:  No shortness of breath with exertion or at rest. No excess mucus, no productive cough, No non-productive cough, No coughing up of blood.No change in color of mucus.No wheezing.No chest wall deformity  Skin:  no rash or lesions.  GU:  no dysuria, change in color of urine, no urgency or frequency. No flank pain.  Musculoskeletal:  No joint pain or swelling. No decreased range of motion. No back pain.  Psych:  No change in mood or affect. No depression or anxiety. Neurologic: No headache, no dysesthesia, no focal weakness, no vision loss. No syncope   Past Medical History  Diagnosis Date  . Diabetes mellitus with neurological manifestation   . Hypertension goal BP (blood pressure) < 130/80   . Hyperlipidemia LDL goal < 100   . Osteopenia     DEXA scan 7/07  . Chronic venous insufficiency   . Morbid obesity   . Diverticulitis     h/o 2-3 episodes in past  . Leg edema     secondary to chronic venous insufficiency  . Cellulitis of left leg     history of, most recent episode 01/09   Past Surgical History  Procedure Laterality Date  . Appendectomy    . Abdominal hysterectomy    . Colectomy      with diverting colsotmy and revision in the 1990's for diverticulitis  .  Eye surgery  8/13    left cataract removal & retinal repair   Social History:  reports that she has never smoked. She does not have any smokeless  tobacco history on file. She reports that she does not drink alcohol or use illicit drugs.  Family History  Problem Relation Age of Onset  . Heart disease Mother   . Heart disease Father   . Heart disease Brother   . Cancer Brother   . Cancer Brother   . Hypertension Sister   . Heart disease Sister     Allergies  Allergen Reactions  . Aspirin Nausea Only    And causes bruises  . Banana Hives  . Hydrocodone Hives  . Neomycin-Polymyxin-Gramicidin Itching and Swelling    Eye drops caused swelling in face and rash and itching on arms and neck  . Penicillins Hives and Itching  . Latex Hives and Rash     Prior to Admission medications   Medication Sig Start Date End Date Taking? Authorizing Provider  ACCU-CHEK AVIVA PLUS test strip  04/29/12  Yes Historical Provider, MD  Alogliptin-Pioglitazone (OSENI) 12.5-30 MG TABS Take 1 tablet by mouth daily.   Yes Historical Provider, MD  atorvastatin (LIPITOR) 20 MG tablet Take 1 tablet (20 mg total) by mouth daily. 11/28/12  Yes Tiffany L Reed, DO  Capsaicin-Menthol-Methyl Sal (CAPSAICIN-METHYL SAL-MENTHOL) 0.025-1-12 % CREA Apply 1 Tube topically 4 (four) times daily. 04/04/13  Yes Mahima Bubba Camp, MD  diphenhydrAMINE (BENADRYL) 25 MG tablet Take 2 tablets (50 mg total) by mouth every 6 (six) hours as needed for itching. 03/19/12  Yes John L Molpus, MD  hydrochlorothiazide (HYDRODIURIL) 25 MG tablet Take 25 mg by mouth daily.   Yes Historical Provider, MD  ibuprofen (ADVIL,MOTRIN) 200 MG tablet Take 400 mg by mouth every 6 (six) hours as needed for pain. For pain   Yes Historical Provider, MD  lisinopril (PRINIVIL,ZESTRIL) 40 MG tablet Take 40 mg by mouth daily.   Yes Historical Provider, MD  metFORMIN (GLUCOPHAGE) 1000 MG tablet TAKE 1 TABLET BY MOUTH TWICE DAILY WITH A MEAL 02/14/13  Yes Tiffany L Reed, DO  metoprolol succinate (TOPROL-XL) 25 MG 24 hr tablet Take 25 mg by mouth daily.   Yes Historical Provider, MD  omeprazole (PRILOSEC) 20 MG  capsule Take 1 capsule (20 mg total) by mouth daily. 01/19/13  Yes Pricilla Larsson, NP  pioglitazone (ACTOS) 30 MG tablet TAKE 1 TABLET BY MOUTH EVERY MORNING 02/12/13  Yes Tiffany L Reed, DO  pregabalin (LYRICA) 75 MG capsule Take 1 capsule (75 mg total) by mouth 3 (three) times daily. 04/04/13  Yes Blanchie Serve, MD    Physical Exam: Filed Vitals:   04/13/13 0555 04/13/13 0815 04/13/13 0822  BP: 156/77 145/120 135/61  Pulse: 69 65   Temp: 97.6 F (36.4 C)    TempSrc: Oral    Resp: 20 12   Height: 5\' 4"  (1.626 m)    Weight: 136.079 kg (300 lb)    SpO2: 94% 94% 96%   General:  A&O x 3, NAD, nontoxic, pleasant/cooperative Head/Eye: No conjunctival hemorrhage, no icterus, [place]/AT, No nystagmus ENT:  No icterus,  No thrush, good dentition, no pharyngeal exudate Neck:  No masses, no lymphadenpathy, no bruits CV:  RRR, no rub, no gallop, no S3 Lung:  CTAB, good air movement, no wheeze, no rhonchi Abdomen: soft/NT, +BS, nondistended, no peritoneal signs Ext: No cyanosis, No rashes, No petechiae, No lymphangitis, 2+LE edema  Labs on Admission:  Basic Metabolic  Panel:  Recent Labs Lab 04/13/13 0620  NA 141  K 3.9  CL 100  CO2 28  GLUCOSE 165*  BUN 22  CREATININE 0.76  CALCIUM 9.2   Liver Function Tests:  Recent Labs Lab 04/13/13 0620  AST 15  ALT 16  ALKPHOS 78  BILITOT 0.4  PROT 7.0  ALBUMIN 3.7    Recent Labs Lab 04/13/13 0620  LIPASE 28   No results found for this basename: AMMONIA,  in the last 168 hours CBC:  Recent Labs Lab 04/13/13 0620  WBC 7.0  NEUTROABS 3.4  HGB 12.9  HCT 38.2  MCV 92.9  PLT 203   Cardiac Enzymes: No results found for this basename: CKTOTAL, CKMB, CKMBINDEX, TROPONINI,  in the last 168 hours BNP: No components found with this basename: POCBNP,  CBG: No results found for this basename: GLUCAP,  in the last 168 hours  Radiological Exams on Admission: US Abdomen Complete  04/13/2013   CLINICAL DATA:  Right upper quadrant  abdominal pain.  EXAM: ULTRASOUND ABDOMEN COMPLETE  COMPARISON:  None.  FINDINGS: Gallbladder:  Layering gallstones are present. Wall thickness is upper limits of normal at 2.9 mm. A positive sonographic Percell Miller sign is reported.  Common bile duct:  Diameter: 7.3 mm.  Mild extrahepatic biliary dilation is present.  Liver:  No focal lesion identified. Within normal limits in parenchymal echogenicity.  IVC:  No abnormality visualized.  Pancreas:  Visualized portion unremarkable.  Spleen:  Size and appearance within normal limits.  Right Kidney:  Length: 10.3 cm, within normal limits. Echogenicity within normal limits. No mass or hydronephrosis visualized.  Left Kidney:  Length: 12.0 cm, within normal limits. Echogenicity within normal limits. No mass or hydronephrosis visualized.  Abdominal aorta:  No aneurysm visualized.  Other findings:  None.  IMPRESSION: 1. Cholelithiasis with a positive sonographic Murphy sign suggesting acute cholecystitis. 2. Borderline wall thickening of the gallbladder. 3. Mild extra hepatic biliary dilation.   Electronically Signed   By: Lawrence Santiago M.D.   On: 04/13/2013 07:23     Time spent: 60 min  Ailed Defibaugh [facility] Pager [phone]  If 7PM-7AM, please contact night-coverage [email or web address] Password [email or web address] 04/13/2013, 11:00 AM

## 2013-04-13 NOTE — H&P (Signed)
General surgery attending note:  I personally interviewed and examined this patient this morning. I have discussed her treatment plan with her and her husband. I agree with the assessment and treatment plan outlined by Mr. Creig Hines PA.  She will need to be admitted, placed on antibiotics and pain medication and IV hydration. She will need to undergo cholecystectomy this admission, probably tomorrow. Because of her multiple comorbidities we are asking for a internal medicine consult for assistance with risk assessment and management.  Hopefully she can undergo cholecystectomy with cholangiogram tomorrow. I am skeptical that we can do this laparoscopically because of her complex abdominal incisions, including a right-sided colostomy site from past diverticulitis. I discussed the indications, details, techniques, numerous risk of surgery with the patient and her husband. They understand all these issues and all their questions were answered. They agree with this plan.   Edsel Petrin. Dalbert Batman, M.D., [facility] General and Minimally invasive Surgery Breast and Colorectal Surgery Office:   [phone] Pager:   [phone]

## 2013-04-13 NOTE — ED Notes (Signed)
EKG given to EDP,[place],MD. For review. 

## 2013-04-13 NOTE — ED Notes (Signed)
Pt c/o bilateral "side" pain under bilateral sides of ribs that began yesterday morning; pt states that the pain has progressively gotten worse and now radiates to back and upper abd; pt denies N/V; pt states that the pain has decreased last pm but returned early this am and woke pt from sleep; pt states that she has not been able to get to get comfortable since awakening

## 2013-04-13 NOTE — H&P (Signed)
Debra [name] is an 68 y.o. female.   Chief Complaint: abdominal pain right side under rib cage. HPI: 68 y/o with medical issues note below, was doing well till yesterday.  She woke up with abdominal pain and some nausea.  She got better and had breakfast at 10 AM and kept stuff down and went about her day.  She was able to eat supper and go to bed without any further issue.  She woke up at [facility] with acute pain right side under rib cage going to her back.  She had some nausea, but no vomiting.  Pain got progressively worse and she came to the ER.  This was different from prior bouts of diverticultis.  She had work up showing an elevated glucose, but normal LFT's.  WBC is normal, UA is normal.  Abdominal ultrasound shows: Layering gallstones are present. Wall thickness is upper limits of normal at 2.9 mm. A positive sonographic Percell Miller sign is reported. Common bile duct: Diameter: 7.3 mm. Mild extrahepatic biliary dilation is present.  She has resolution of her pain now except on deep palpation.  We are ask to see.   Past Medical History  Diagnosis Date  . 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 Hypertension goal BP (blood pressure) < 130/80   . Hyperlipidemia LDL goal < 100   . Osteopenia     DEXA scan 7/07  . Chronic venous insufficiency   . Morbid obesity BMI 51   . Diverticulitis     h/o 2-3 episodes in past   C Diff colitis 2009    DVT LLE, not placed on anticoagulants last year   . Leg edema     secondary to chronic venous insufficiency  . Cellulitis of left leg     history of, most recent episode 01/09    Past Surgical History  Procedure Laterality Date  . Appendectomy    . Abdominal hysterectomy    . Colectomy      with diverting colsotmy and revision in the 1990's for diverticulitis  . Eye surgery  8/13    left cataract removal & retinal repair    Family History  Problem Relation Age of Onset  . Heart disease Mother   . Heart  disease Father   . Heart disease Brother   . Cancer Brother   . Cancer Brother   . Hypertension Sister   . Heart disease Sister    Social History:  reports that she has never smoked. She does not have any smokeless tobacco history on file. She reports that she does not drink alcohol or use illicit drugs. 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 Allergies:  Allergies  Allergen Reactions  . Aspirin Nausea Only    And causes bruises  . Banana Hives  . Hydrocodone Hives  . Neomycin-Polymyxin-Gramicidin Itching and Swelling    Eye drops caused swelling in face and rash and itching on arms and neck  . Penicillins Hives and Itching  . Latex Hives and Rash   Prior to Admission medications   Medication Sig Start Date End Date Taking? Authorizing Provider  ACCU-CHEK AVIVA PLUS test strip  04/29/12  Yes Historical Provider, MD  Alogliptin-Pioglitazone (OSENI) 12.5-30 MG TABS Take 1 tablet by mouth daily.   Yes Historical Provider, MD  atorvastatin (LIPITOR) 20 MG tablet Take 1 tablet (20 mg total) by mouth daily. 11/28/12  Yes Catlett, DO  Capsaicin-Menthol-Methyl Sal (CAPSAICIN-METHYL SAL-MENTHOL) 0.025-1-12 % CREA Apply 1 Tube topically 4 (four) times daily. 04/04/13  Yes Mahima Bubba Camp, MD  diphenhydrAMINE (BENADRYL) 25 MG tablet Take 2 tablets (50 mg total) by mouth every 6 (six) hours as needed for itching. 03/19/12  Yes John L Molpus, MD  hydrochlorothiazide (HYDRODIURIL) 25 MG tablet Take 25 mg by mouth daily.   Yes Historical Provider, MD  ibuprofen (ADVIL,MOTRIN) 200 MG tablet Take 400 mg by mouth every 6 (six) hours as needed for pain. For pain   Yes Historical Provider, MD  lisinopril (PRINIVIL,ZESTRIL) 40 MG tablet Take 40 mg by mouth daily.   Yes Historical Provider, MD  metFORMIN (GLUCOPHAGE) 1000 MG tablet TAKE 1 TABLET BY MOUTH TWICE DAILY WITH A MEAL 02/14/13  Yes Tiffany L Reed, DO  metoprolol succinate (TOPROL-XL) 25 MG 24 hr tablet  Take 25 mg by mouth daily.   Yes Historical Provider, MD  omeprazole (PRILOSEC) 20 MG capsule Take 1 capsule (20 mg total) by mouth daily. 01/19/13  Yes Pricilla Larsson, NP  pioglitazone (ACTOS) 30 MG tablet TAKE 1 TABLET BY MOUTH EVERY MORNING 02/12/13  Yes Tiffany L Reed, DO  pregabalin (LYRICA) 75 MG capsule Take 1 capsule (75 mg total) by mouth 3 (three) times daily. 04/04/13  Yes Mahima Bubba Camp, MD      Results for orders placed during the hospital encounter of 04/13/13 (from the past 48 hour(s))  CBC WITH DIFFERENTIAL     Status: None   Collection Time    04/13/13  6:20 AM      Result Value Range   WBC 7.0  4.0 - 10.5 K/uL   RBC 4.11  3.87 - 5.11 MIL/uL   Hemoglobin 12.9  12.0 - 15.0 g/dL   HCT 38.2  36.0 - 46.0 %   MCV 92.9  78.0 - 100.0 fL   MCH 31.4  26.0 - 34.0 pg   MCHC 33.8  30.0 - 36.0 g/dL   RDW 13.6  11.5 - 15.5 %   Platelets 203  150 - 400 K/uL   Neutrophils Relative % 49  43 - 77 %   Neutro Abs 3.4  1.7 - 7.7 K/uL   Lymphocytes Relative 35  12 - 46 %   Lymphs Abs 2.4  0.7 - 4.0 K/uL   Monocytes Relative 12  3 - 12 %   Monocytes Absolute 0.9  0.1 - 1.0 K/uL   Eosinophils Relative 4  0 - 5 %   Eosinophils Absolute 0.3  0.0 - 0.7 K/uL   Basophils Relative 0  0 - 1 %   Basophils Absolute 0.0  0.0 - 0.1 K/uL  COMPREHENSIVE METABOLIC PANEL     Status: Abnormal   Collection Time    04/13/13  6:20 AM      Result Value Range   Sodium 141  137 - 147 mEq/L   Potassium 3.9  3.7 - 5.3 mEq/L   Chloride 100  96 - 112 mEq/L   CO2 28  19 - 32 mEq/L   Glucose, Bld 165 (*) 70 - 99 mg/dL   BUN 22  6 - 23 mg/dL   Creatinine, Ser 0.76  0.50 - 1.10 mg/dL   Calcium 9.2  8.4 - 10.5 mg/dL   Total Protein 7.0  6.0 - 8.3 g/dL   Albumin 3.7  3.5 - 5.2 g/dL   AST 15  0 - 37 U/L   ALT 16  0 - 35 U/L   Alkaline Phosphatase 78  39 - 117 U/L   Total Bilirubin 0.4  0.3 - 1.2 mg/dL   GFR calc non Af Amer 86 (*) >90 mL/min   GFR calc Af Amer >90  >90 mL/min   Comment: (NOTE)     The  eGFR has been calculated using the CKD EPI equation.     This calculation has not been validated in all clinical situations.     eGFR's persistently <90 mL/min signify possible Chronic Kidney     Disease.  LIPASE, BLOOD     Status: None   Collection Time    04/13/13  6:20 AM      Result Value Range   Lipase 28  11 - 59 U/L  URINALYSIS, ROUTINE W REFLEX MICROSCOPIC     Status: Abnormal   Collection Time    04/13/13  7:14 AM      Result Value Range   Color, Urine YELLOW  YELLOW   APPearance CLEAR  CLEAR   Specific Gravity, Urine 1.011  1.005 - 1.030   pH 7.0  5.0 - 8.0   Glucose, UA NEGATIVE  NEGATIVE mg/dL   Hgb urine dipstick NEGATIVE  NEGATIVE   Bilirubin Urine NEGATIVE  NEGATIVE   Ketones, ur NEGATIVE  NEGATIVE mg/dL   Protein, ur NEGATIVE  NEGATIVE mg/dL   Urobilinogen, UA 0.2  0.0 - 1.0 mg/dL   Nitrite NEGATIVE  NEGATIVE   Leukocytes, UA TRACE (*) NEGATIVE  URINE MICROSCOPIC-ADD ON     Status: None   Collection Time    04/13/13  7:14 AM      Result Value Range   Squamous Epithelial / LPF RARE  RARE   WBC, UA 0-2  <3 WBC/hpf   US Abdomen Complete  04/13/2013   CLINICAL DATA:  Right upper quadrant abdominal pain.  EXAM: ULTRASOUND ABDOMEN COMPLETE  COMPARISON:  None.  FINDINGS: Gallbladder:  Layering gallstones are present. Wall thickness is upper limits of normal at 2.9 mm. A positive sonographic Percell Miller sign is reported.  Common bile duct:  Diameter: 7.3 mm.  Mild extrahepatic biliary dilation is present.  Liver:  No focal lesion identified. Within normal limits in parenchymal echogenicity.  IVC:  No abnormality visualized.  Pancreas:  Visualized portion unremarkable.  Spleen:  Size and appearance within normal limits.  Right Kidney:  Length: 10.3 cm, within normal limits. Echogenicity within normal limits. No mass or hydronephrosis visualized.  Left Kidney:  Length: 12.0 cm, within normal limits. Echogenicity within normal limits. No mass or hydronephrosis visualized.  Abdominal  aorta:  No aneurysm visualized.  Other findings:  None.  IMPRESSION: 1. Cholelithiasis with a positive sonographic Murphy sign suggesting acute cholecystitis. 2. Borderline wall thickening of the gallbladder. 3. Mild extra hepatic biliary dilation.   Electronically Signed   By: Lawrence Santiago M.D.   On: 04/13/2013 07:23    Review of Systems  Constitutional: Negative for fever, chills and weight loss.  HENT: Negative.   Eyes: Negative.   Respiratory: Positive for cough and shortness of breath (chronic DOE and orthopnea). Negative for hemoptysis, sputum production and wheezing.   Gastrointestinal: Positive for heartburn (some treated for this preop), nausea, abdominal pain (mid upper abdomen to start.  Now in RUQ) and diarrhea (chronic she says diabetes medicine causes it.). Negative for vomiting, constipation, blood in stool and melena.  Genitourinary: Negative.   Musculoskeletal:       She has trouble walking uses a cane , arthritis in knees, and says her legs give out walking very  far. Chronic LE edema  Skin: Negative.   Neurological: Negative.   Endo/Heme/Allergies: Negative for environmental allergies and polydipsia. Bruises/bleeds easily.  Psychiatric/Behavioral: Negative.   She walks with a cane for balance and support.  Blood pressure 135/61, pulse 65, temperature 97.6 F (36.4 C), temperature source Oral, resp. rate 12, height '5\' 4"'  (1.626 m), weight 136.079 kg (300 lb), last menstrual period 07/02/1973, SpO2 96.00%. Physical Exam  Constitutional: She is oriented to person, place, and time. She appears well-developed and well-nourished. No distress.  Obese, NAD, no pain now except with palpation RUQ  HENT:  Head: Normocephalic and atraumatic.  Nose: Nose normal.  Eyes: Conjunctivae and EOM are normal. Pupils are equal, round, and reactive to light. Right eye exhibits no discharge. Left eye exhibits no discharge. No scleral icterus.  Neck: Normal range of motion. Neck supple. No JVD  present. No tracheal deviation present. No thyromegaly present.  Cardiovascular: Normal rate, regular rhythm and intact distal pulses.   Murmur (soft systolic mumur) heard. Respiratory: Effort normal and breath sounds normal. No respiratory distress. She has no wheezes. She has no rales. She exhibits no tenderness.  GI: Soft. Bowel sounds are normal. She exhibits no distension (she has a very large abdomen) and no mass. There is tenderness (deep palpation RUQ). There is no rebound and no guarding.  Midline abdominal scar above and below the umbilicus Right colostomy scar Ventral hernia x 2  Musculoskeletal: She exhibits edema (bilat lower leg edema and skin changes).  Lymphadenopathy:    She has no cervical adenopathy.  Neurological: She is alert and oriented to person, place, and time. No cranial nerve deficit.  Skin: Skin is warm and dry. No rash noted. She is not diaphoretic. No erythema. No pallor.  Psychiatric: She has a normal mood and affect. Her behavior is normal. Judgment and thought content normal.     Assessment/Plan 1.  Symptomatic cholelithiasis 2.  AODM with neuropathy 3.  Hypertension  4.  Dyslipidemia 5.  Hx of diverticulits, colostomy/multiple abdominal surgeries/Ventral hernia 6.  Morbid obesity BMI 51 7.  Chronic venous insuffiencey 8.  Hx of LLE DVT last year, not placed on anticoagulation 9.  Deconditioning requires cane to walk.   Plan:  We will admit and place on antibiotics, ask medicine to see and help with medical issues and clear for surgery tomorrow. With her multiple abdominal surgeries she may need to be done open.  Recheck her labs in Am.   Harold Mattes 04/13/2013, 10:19 AM

## 2013-04-13 NOTE — ED Provider Notes (Signed)
CSN: [id number]     Arrival date & time 04/13/13  0550 History   None    Chief Complaint  Patient presents with  . Abdominal Pain   (Consider location/radiation/quality/duration/timing/severity/associated sxs/prior Treatment) HPI Comments: 68 yo female with hx of diverticulitis, DM, HTN, presents with c/o abd pain starting yesterday morning. Reports that the pain was initially generalized and described as a sense of fullness and nausea, preventing her from eating during the day yesterday. Symptoms improved somewhat in the evening but recurred overnight, traveling and localizing to the RUQ. Denies fever, chills, cough, chest pain, dyspnea, urinary symptoms, diarrhea, constipation, vomiting.  The history is provided by the patient.    Past Medical History  Diagnosis Date  . Diabetes mellitus with neurological manifestation   . Hypertension goal BP (blood pressure) < 130/80   . Hyperlipidemia LDL goal < 100   . Osteopenia     DEXA scan 7/07  . Chronic venous insufficiency   . Morbid obesity   . Diverticulitis     h/o 2-3 episodes in past  . Leg edema     secondary to chronic venous insufficiency  . Cellulitis of left leg     history of, most recent episode 01/09   Past Surgical History  Procedure Laterality Date  . Appendectomy    . Abdominal hysterectomy    . Colectomy      with diverting colsotmy and revision in the 1990's for diverticulitis  . Eye surgery  8/13    left cataract removal & retinal repair   Family History  Problem Relation Age of Onset  . Heart disease Mother   . Heart disease Father   . Heart disease Brother   . Cancer Brother   . Cancer Brother   . Hypertension Sister   . Heart disease Sister    History  Substance Use Topics  . Smoking status: Never Smoker   . Smokeless tobacco: Not on file  . Alcohol Use: No   OB History   Grav Para Term Preterm Abortions TAB SAB Ect Mult Living                 Review of Systems  Constitutional: Negative  for fever.  HENT: Negative for rhinorrhea and sore throat.   Eyes: Negative for visual disturbance.  Respiratory: Negative for cough and shortness of breath.   Cardiovascular: Negative for chest pain.  Gastrointestinal: Positive for nausea and abdominal pain. Negative for vomiting, diarrhea and constipation.  Genitourinary: Negative for dysuria.  Musculoskeletal: Negative for back pain.  Skin: Negative for rash.  Neurological: Negative for headaches.  Hematological: Negative for adenopathy.  Psychiatric/Behavioral: Negative for agitation.    Allergies  Aspirin; Banana; Hydrocodone; Neomycin-polymyxin-gramicidin; Penicillins; and Latex  Home Medications   Current Outpatient Rx  Name  Route  Sig  Dispense  Refill  . ACCU-CHEK AVIVA PLUS test strip               . Alogliptin-Pioglitazone (OSENI) 12.5-30 MG TABS   Oral   Take 1 tablet by mouth daily.         Marland Kitchen atorvastatin (LIPITOR) 20 MG tablet   Oral   Take 1 tablet (20 mg total) by mouth daily.   90 tablet   3   . Capsaicin-Menthol-Methyl Sal (CAPSAICIN-METHYL SAL-MENTHOL) 0.025-1-12 % CREA   Apply externally   Apply 1 Tube topically 4 (four) times daily.   1 Tube   2   . diphenhydrAMINE (BENADRYL) 25 MG tablet  Oral   Take 2 tablets (50 mg total) by mouth every 6 (six) hours as needed for itching.         . gabapentin (NEURONTIN) 300 MG capsule      Take one capsule three times a day for 3 days followed by one capsule twice a day for 3 days followed by one capsule daily for 3 days and then stop it   18 capsule   0     Needs f/u appt   . hydrochlorothiazide (HYDRODIURIL) 25 MG tablet   Oral   Take 25 mg by mouth daily.         Marland Kitchen ibuprofen (ADVIL,MOTRIN) 200 MG tablet   Oral   Take 400 mg by mouth every 6 (six) hours as needed for pain. For pain         . lisinopril (PRINIVIL,ZESTRIL) 40 MG tablet   Oral   Take 40 mg by mouth daily.         Marland Kitchen loperamide (IMODIUM) 2 MG capsule   Oral   Take  2 mg by mouth as needed for diarrhea or loose stools.         . metFORMIN (GLUCOPHAGE) 1000 MG tablet      TAKE 1 TABLET BY MOUTH TWICE DAILY WITH A MEAL   60 tablet   5   . metoprolol succinate (TOPROL-XL) 25 MG 24 hr tablet   Oral   Take 25 mg by mouth daily.         Marland Kitchen omeprazole (PRILOSEC) 20 MG capsule   Oral   Take 1 capsule (20 mg total) by mouth daily.   30 capsule   3   . pioglitazone (ACTOS) 30 MG tablet      TAKE 1 TABLET BY MOUTH EVERY MORNING   30 tablet   3   . pregabalin (LYRICA) 75 MG capsule   Oral   Take 1 capsule (75 mg total) by mouth 3 (three) times daily.   90 capsule   2    BP 156/77  Pulse 69  Temp(Src) 97.6 F (36.4 C) (Oral)  Resp 20  Ht 5\' 4"  (1.626 m)  Wt 300 lb (136.079 kg)  BMI 51.47 kg/m2  SpO2 94%  LMP 07/02/1973 Physical Exam  Nursing note and vitals reviewed. Constitutional: She is oriented to person, place, and time. She appears well-developed and well-nourished.  HENT:  Head: Normocephalic and atraumatic.  Right Ear: External ear normal.  Left Ear: External ear normal.  Nose: Nose normal.  Mouth/Throat: Oropharynx is clear and moist.  Eyes: EOM are normal.  Neck: Normal range of motion. Neck supple.  Cardiovascular: Normal rate, regular rhythm, normal heart sounds and intact distal pulses.   Pulmonary/Chest: Effort normal and breath sounds normal.  Abdominal: Soft. Bowel sounds are normal. There is tenderness ( mild RUQ). There is no rebound and no guarding.  Musculoskeletal: Normal range of motion.  Lymphadenopathy:    She has no cervical adenopathy.  Neurological: She is alert and oriented to person, place, and time.  Skin: Skin is warm and dry.  Psychiatric: She has a normal mood and affect.    ED Course  Procedures (including critical care time) Labs Review Labs Reviewed  COMPREHENSIVE METABOLIC PANEL - Abnormal; Notable for the following:    Glucose, Bld 165 (*)    GFR calc non Af Amer 86 (*)    All  other components within normal limits  URINALYSIS, ROUTINE W REFLEX MICROSCOPIC - Abnormal; Notable for the  following:    Leukocytes, UA TRACE (*)    All other components within normal limits  CBC WITH DIFFERENTIAL  LIPASE, BLOOD  URINE MICROSCOPIC-ADD ON   Imaging Review US Abdomen Complete  04/13/2013   CLINICAL DATA:  Right upper quadrant abdominal pain.  EXAM: ULTRASOUND ABDOMEN COMPLETE  COMPARISON:  None.  FINDINGS: Gallbladder:  Layering gallstones are present. Wall thickness is upper limits of normal at 2.9 mm. A positive sonographic Percell Miller sign is reported.  Common bile duct:  Diameter: 7.3 mm.  Mild extrahepatic biliary dilation is present.  Liver:  No focal lesion identified. Within normal limits in parenchymal echogenicity.  IVC:  No abnormality visualized.  Pancreas:  Visualized portion unremarkable.  Spleen:  Size and appearance within normal limits.  Right Kidney:  Length: 10.3 cm, within normal limits. Echogenicity within normal limits. No mass or hydronephrosis visualized.  Left Kidney:  Length: 12.0 cm, within normal limits. Echogenicity within normal limits. No mass or hydronephrosis visualized.  Abdominal aorta:  No aneurysm visualized.  Other findings:  None.  IMPRESSION: 1. Cholelithiasis with a positive sonographic Murphy sign suggesting acute cholecystitis. 2. Borderline wall thickening of the gallbladder. 3. Mild extra hepatic biliary dilation.   Electronically Signed   By: Lawrence Santiago M.D.   On: 04/13/2013 07:23    EKG Interpretation   None       MDM   1. Acute cholecystitis   2. Diabetes mellitus with neurological manifestation   3. Hypertension goal BP (blood pressure) < 130/80   4. Other and unspecified hyperlipidemia    68 yo female with generalized abd pain localizing to the RUQ. EKG reveals NSR with no acute ST or Twave changes. Labs WNL, no leukocytosis, normal LFT's. UA trace leukocytes, no evidence of acute UTI. US reveals gallstones with evidence  suggestive of cholecystitis. Surgery consulted and arrangements made for admission. Pain controlled with morphine/zofran, afebrile and nontoxic and hemodynamically stable during the ED evaluation.    Liliane Bade, NP 04/13/13 [phone]

## 2013-04-13 NOTE — ED Notes (Signed)
Pt. Made aware for the need of urine. 

## 2013-04-14 ENCOUNTER — Encounter (HOSPITAL_COMMUNITY): Payer: Medicare HMO | Admitting: Anesthesiology

## 2013-04-14 ENCOUNTER — Inpatient Hospital Stay (HOSPITAL_COMMUNITY): Payer: Medicare HMO

## 2013-04-14 ENCOUNTER — Inpatient Hospital Stay (HOSPITAL_COMMUNITY): Payer: Medicare HMO | Admitting: Anesthesiology

## 2013-04-14 ENCOUNTER — Encounter (HOSPITAL_COMMUNITY): Admission: EM | Disposition: A | Discharge status: Home / Self Care | Payer: Self-pay | Source: Home / Self Care

## 2013-04-14 ENCOUNTER — Encounter (HOSPITAL_COMMUNITY): Payer: Self-pay | Admitting: Certified Registered Nurse Anesthetist

## 2013-04-14 DIAGNOSIS — K8010 Calculus of gallbladder with chronic cholecystitis without obstruction: Secondary | ICD-10-CM

## 2013-04-14 DIAGNOSIS — K660 Peritoneal adhesions (postprocedural) (postinfection): Secondary | ICD-10-CM

## 2013-04-14 DIAGNOSIS — K8050 Calculus of bile duct without cholangitis or cholecystitis without obstruction: Secondary | ICD-10-CM

## 2013-04-14 DIAGNOSIS — R7989 Other specified abnormal findings of blood chemistry: Secondary | ICD-10-CM

## 2013-04-14 DIAGNOSIS — K8042 Calculus of bile duct with acute cholecystitis without obstruction: Secondary | ICD-10-CM | POA: Diagnosis present

## 2013-04-14 DIAGNOSIS — R932 Abnormal findings on diagnostic imaging of liver and biliary tract: Secondary | ICD-10-CM

## 2013-04-14 HISTORY — PX: CHOLECYSTECTOMY: SHX55

## 2013-04-14 HISTORY — PX: LAPAROSCOPIC LYSIS OF ADHESIONS: SHX5905

## 2013-04-14 LAB — COMPREHENSIVE METABOLIC PANEL
ALBUMIN: 3.5 g/dL (ref 3.5–5.2)
ALT: 795 U/L — ABNORMAL HIGH (ref 0–35)
AST: 1114 U/L — ABNORMAL HIGH (ref 0–37)
Alkaline Phosphatase: 175 U/L — ABNORMAL HIGH (ref 39–117)
BUN: 14 mg/dL (ref 6–23)
CO2: 24 mEq/L (ref 19–32)
Calcium: 9 mg/dL (ref 8.4–10.5)
Chloride: 97 mEq/L (ref 96–112)
Creatinine, Ser: 0.71 mg/dL (ref 0.50–1.10)
GFR calc Af Amer: >90 mL/min (ref >90)
GFR calc non Af Amer: 88 mL/min — ABNORMAL LOW (ref >90)
Glucose, Bld: 224 mg/dL — ABNORMAL HIGH (ref 70–99)
POTASSIUM: 3.9 meq/L (ref 3.7–5.3)
SODIUM: 136 meq/L — AB (ref 137–147)
TOTAL PROTEIN: 7 g/dL (ref 6.0–8.3)
Total Bilirubin: 2.6 mg/dL — ABNORMAL HIGH (ref 0.3–1.2)

## 2013-04-14 LAB — GLUCOSE, CAPILLARY
Glucose-Capillary: 182 mg/dL — ABNORMAL HIGH (ref 70–99)
Glucose-Capillary: 191 mg/dL — ABNORMAL HIGH (ref 70–99)
Glucose-Capillary: 213 mg/dL — ABNORMAL HIGH (ref 70–99)

## 2013-04-14 LAB — MAGNESIUM: Magnesium: 1.2 mg/dL — ABNORMAL LOW (ref 1.5–2.5)

## 2013-04-14 LAB — PROTIME-INR
INR: 1.04 (ref 0.00–1.49)
Prothrombin Time: 13.4 seconds (ref 11.6–15.2)

## 2013-04-14 LAB — SURGICAL PCR SCREEN
MRSA, PCR: NEGATIVE
STAPHYLOCOCCUS AUREUS: NEGATIVE

## 2013-04-14 LAB — APTT: aPTT: 30 seconds (ref 24–37)

## 2013-04-14 LAB — CBC
HEMATOCRIT: 38.6 % (ref 36.0–46.0)
Hemoglobin: 12.7 g/dL (ref 12.0–15.0)
MCH: 30.6 pg (ref 26.0–34.0)
MCHC: 32.9 g/dL (ref 30.0–36.0)
MCV: 93 fL (ref 78.0–100.0)
Platelets: 189 10*3/uL (ref 150–400)
RBC: 4.15 MIL/uL (ref 3.87–5.11)
RDW: 13.8 % (ref 11.5–15.5)
WBC: 3.4 10*3/uL — AB (ref 4.0–10.5)

## 2013-04-14 LAB — LIPASE, BLOOD
Lipase: 29 U/L (ref 11–59)
Lipase: 18 U/L (ref 11–59)

## 2013-04-14 SURGERY — LAPAROSCOPIC CHOLECYSTECTOMY WITH INTRAOPERATIVE CHOLANGIOGRAM
Anesthesia: General | Site: Abdomen

## 2013-04-14 MED ORDER — LACTATED RINGERS IV SOLN: INTRAVENOUS | Status: DC

## 2013-04-14 MED ORDER — PROMETHAZINE HCL 25 MG/ML IJ SOLN: 6.2500 mg | INTRAMUSCULAR | Status: DC | PRN

## 2013-04-14 MED ORDER — SUCCINYLCHOLINE CHLORIDE 20 MG/ML IJ SOLN
INTRAMUSCULAR | Status: DC | PRN
  Administered 2013-04-14: 140 mg via INTRAVENOUS

## 2013-04-14 MED ORDER — NEOSTIGMINE METHYLSULFATE 1 MG/ML IJ SOLN
INTRAMUSCULAR | Status: AC
  Filled 2013-04-14: qty 10

## 2013-04-14 MED ORDER — PROPOFOL 10 MG/ML IV BOLUS
INTRAVENOUS | Status: AC
  Filled 2013-04-14: qty 20

## 2013-04-14 MED ORDER — HYDROMORPHONE HCL PF 1 MG/ML IJ SOLN
INTRAMUSCULAR | Status: AC
  Filled 2013-04-14: qty 1

## 2013-04-14 MED ORDER — NEOSTIGMINE METHYLSULFATE 1 MG/ML IJ SOLN
INTRAMUSCULAR | Status: DC | PRN
  Administered 2013-04-14: 5 mg via INTRAVENOUS

## 2013-04-14 MED ORDER — PROCHLORPERAZINE EDISYLATE 5 MG/ML IJ SOLN
10.0000 mg | Freq: Four times a day (QID) | INTRAMUSCULAR | Status: DC | PRN
  Administered 2013-04-14 – 2013-04-17 (×3): 10 mg via INTRAVENOUS
  Filled 2013-04-14 (×3): qty 2

## 2013-04-14 MED ORDER — BUPIVACAINE-EPINEPHRINE 0.5% -1:200000 IJ SOLN
INTRAMUSCULAR | Status: DC | PRN
  Administered 2013-04-14: 35 mL

## 2013-04-14 MED ORDER — HYDROMORPHONE HCL PF 1 MG/ML IJ SOLN
0.2500 mg | INTRAMUSCULAR | Status: DC | PRN
  Administered 2013-04-14 (×4): 0.5 mg via INTRAVENOUS

## 2013-04-14 MED ORDER — BUPIVACAINE-EPINEPHRINE PF 0.5-1:200000 % IJ SOLN
INTRAMUSCULAR | Status: AC
  Filled 2013-04-14: qty 30

## 2013-04-14 MED ORDER — FENTANYL CITRATE 0.05 MG/ML IJ SOLN
INTRAMUSCULAR | Status: AC
  Filled 2013-04-14: qty 2

## 2013-04-14 MED ORDER — ROCURONIUM BROMIDE 100 MG/10ML IV SOLN
INTRAVENOUS | Status: AC
  Filled 2013-04-14: qty 1

## 2013-04-14 MED ORDER — ONDANSETRON HCL 4 MG/2ML IJ SOLN
INTRAMUSCULAR | Status: DC | PRN
  Administered 2013-04-14: 4 mg via INTRAVENOUS

## 2013-04-14 MED ORDER — MIDAZOLAM HCL 5 MG/5ML IJ SOLN
INTRAMUSCULAR | Status: DC | PRN
  Administered 2013-04-14: 1 mg via INTRAVENOUS

## 2013-04-14 MED ORDER — PROPOFOL 10 MG/ML IV BOLUS
INTRAVENOUS | Status: DC | PRN
  Administered 2013-04-14: 200 mg via INTRAVENOUS

## 2013-04-14 MED ORDER — ONDANSETRON HCL 4 MG/2ML IJ SOLN
INTRAMUSCULAR | Status: AC
  Filled 2013-04-14: qty 2

## 2013-04-14 MED ORDER — MIDAZOLAM HCL 2 MG/2ML IJ SOLN
INTRAMUSCULAR | Status: AC
  Filled 2013-04-14: qty 2

## 2013-04-14 MED ORDER — ONDANSETRON HCL 4 MG/2ML IJ SOLN
4.0000 mg | Freq: Four times a day (QID) | INTRAMUSCULAR | Status: DC | PRN
  Administered 2013-04-17 – 2013-04-18 (×2): 4 mg via INTRAVENOUS
  Filled 2013-04-14 (×4): qty 2

## 2013-04-14 MED ORDER — GLYCOPYRROLATE 0.2 MG/ML IJ SOLN
INTRAMUSCULAR | Status: AC
Start: 2013-04-14 — End: 2013-04-14
  Filled 2013-04-14: qty 3

## 2013-04-14 MED ORDER — CHLORHEXIDINE GLUCONATE 4 % EX LIQD
1.0000 "application " | Freq: Once | CUTANEOUS | Status: DC
  Filled 2013-04-14: qty 15

## 2013-04-14 MED ORDER — LIDOCAINE HCL (CARDIAC) 20 MG/ML IV SOLN
INTRAVENOUS | Status: AC
Start: 2013-04-14 — End: 2013-04-14
  Filled 2013-04-14: qty 5

## 2013-04-14 MED ORDER — GLYCOPYRROLATE 0.2 MG/ML IJ SOLN
INTRAMUSCULAR | Status: DC | PRN
  Administered 2013-04-14: .8 mg via INTRAVENOUS

## 2013-04-14 MED ORDER — FENTANYL CITRATE 0.05 MG/ML IJ SOLN
INTRAMUSCULAR | Status: AC
  Filled 2013-04-14: qty 5

## 2013-04-14 MED ORDER — ROCURONIUM BROMIDE 100 MG/10ML IV SOLN
INTRAVENOUS | Status: DC | PRN
  Administered 2013-04-14: 5 mg via INTRAVENOUS
  Administered 2013-04-14: 45 mg via INTRAVENOUS

## 2013-04-14 MED ORDER — ONDANSETRON HCL 4 MG PO TABS: 4.0000 mg | ORAL_TABLET | Freq: Four times a day (QID) | ORAL | Status: DC | PRN

## 2013-04-14 MED ORDER — FENTANYL CITRATE 0.05 MG/ML IJ SOLN
INTRAMUSCULAR | Status: DC | PRN
  Administered 2013-04-14 (×5): 50 ug via INTRAVENOUS
  Administered 2013-04-14: 100 ug via INTRAVENOUS
  Administered 2013-04-14 (×2): 50 ug via INTRAVENOUS

## 2013-04-14 MED ORDER — LACTATED RINGERS IV SOLN
INTRAVENOUS | Status: DC
  Administered 2013-04-14: 16:00:00 via INTRAVENOUS
  Administered 2013-04-14: 1000 mL via INTRAVENOUS
  Administered 2013-04-14 – 2013-04-15 (×2): via INTRAVENOUS

## 2013-04-14 MED ORDER — EPHEDRINE SULFATE 50 MG/ML IJ SOLN
INTRAMUSCULAR | Status: DC | PRN
  Administered 2013-04-14: 5 mg via INTRAVENOUS
  Administered 2013-04-14: 10 mg via INTRAVENOUS

## 2013-04-14 MED ORDER — LIDOCAINE HCL (CARDIAC) 20 MG/ML IV SOLN
INTRAVENOUS | Status: DC | PRN
  Administered 2013-04-14: 100 mg via INTRAVENOUS

## 2013-04-14 MED ORDER — IOHEXOL 300 MG/ML  SOLN
INTRAMUSCULAR | Status: DC | PRN
  Administered 2013-04-14: 5 mL via INTRAVENOUS

## 2013-04-14 SURGICAL SUPPLY — 38 items
APPLIER CLIP ROT 10 11.4 M/L (STAPLE) ×2
BENZOIN TINCTURE PRP APPL 2/3 (GAUZE/BANDAGES/DRESSINGS) IMPLANT
CANISTER SUCTION 2500CC (MISCELLANEOUS) IMPLANT
CATH FOLEY LATEX FREE 16FR (CATHETERS) ×2 IMPLANT
CLIP APPLIE ROT 10 11.4 M/L (STAPLE) ×1 IMPLANT
COVER MAYO STAND STRL (DRAPES) ×2 IMPLANT
DECANTER SPIKE VIAL GLASS SM (MISCELLANEOUS) ×4 IMPLANT
DERMABOND ADVANCED (GAUZE/BANDAGES/DRESSINGS) ×1
DERMABOND ADVANCED .7 DNX12 (GAUZE/BANDAGES/DRESSINGS) ×1 IMPLANT
DEVICE TROCAR PUNCTURE CLOSURE (ENDOMECHANICALS) ×2 IMPLANT
DRAPE C-ARM 42X120 X-RAY (DRAPES) ×2 IMPLANT
DRAPE LAPAROSCOPIC ABDOMINAL (DRAPES) ×2 IMPLANT
DRAPE UTILITY XL STRL (DRAPES) ×2 IMPLANT
ELECT REM PT RETURN 9FT ADLT (ELECTROSURGICAL) ×2
ELECTRODE REM PT RTRN 9FT ADLT (ELECTROSURGICAL) ×1 IMPLANT
GLOVE BIOGEL PI IND STRL 7.5 (GLOVE) ×1 IMPLANT
GLOVE BIOGEL PI INDICATOR 7.5 (GLOVE) ×1
GLOVE EUDERMIC 7 POWDERFREE (GLOVE) IMPLANT
GOWN STRL REUS W/TWL XL LVL3 (GOWN DISPOSABLE) ×8 IMPLANT
HEMOSTAT SNOW SURGICEL 2X4 (HEMOSTASIS) IMPLANT
KIT BASIN OR (CUSTOM PROCEDURE TRAY) ×2 IMPLANT
MANIFOLD NEPTUNE II (INSTRUMENTS) ×2 IMPLANT
POUCH SPECIMEN RETRIEVAL 10MM (ENDOMECHANICALS) ×2 IMPLANT
SCALPEL HARMONIC ACE (MISCELLANEOUS) ×2 IMPLANT
SCISSORS LAP 5X35 DISP (ENDOMECHANICALS) ×2 IMPLANT
SET CHOLANGIOGRAPH MIX (MISCELLANEOUS) ×2 IMPLANT
SET IRRIG TUBING LAPAROSCOPIC (IRRIGATION / IRRIGATOR) ×2 IMPLANT
SLEEVE XCEL OPT CAN 5 100 (ENDOMECHANICALS) ×4 IMPLANT
SOLUTION ANTI FOG 6CC (MISCELLANEOUS) ×2 IMPLANT
STRIP CLOSURE SKIN 1/2X4 (GAUZE/BANDAGES/DRESSINGS) ×2 IMPLANT
SUT MNCRL AB 4-0 PS2 18 (SUTURE) ×4 IMPLANT
TOWEL OR 17X26 10 PK STRL BLUE (TOWEL DISPOSABLE) ×2 IMPLANT
TOWEL OR NON WOVEN STRL DISP B (DISPOSABLE) ×2 IMPLANT
TRAY LAP CHOLE (CUSTOM PROCEDURE TRAY) ×2 IMPLANT
TROCAR BLADELESS OPT 5 100 (ENDOMECHANICALS) ×4 IMPLANT
TROCAR XCEL BLUNT TIP 100MML (ENDOMECHANICALS) ×2 IMPLANT
TROCAR XCEL NON-BLD 11X100MML (ENDOMECHANICALS) ×2 IMPLANT
TUBING INSUFFLATION 10FT LAP (TUBING) ×2 IMPLANT

## 2013-04-14 NOTE — Consult Note (Signed)
Consultation  Referring Provider:      Primary Care Physician:  Delia Chimes, NP Primary Gastroenterologist:   Unknown. Patient cannot remember who did her remote colonoscopy.     Reason for Consultation:  Abnormal IOC            HPI:   Debra [name] is a 68 y.o. female who presented to ED yesterday with upper abdominal pain, nausea and vomiting. Abdominal u/s revealed cholelithiasis with borderline gallbladder thickening and mild biliary duct dilation. LFTs on admission were normal but they rose significantly overnight and patient was taken for a lap chole today. Procedure proved to be difficult secondary to numerous adhesions. Her cholangiogram was abnormal revealing a distal obstruction /stone. Unable to obtain any history from patient as she is very drowsy from surgery.   Patient is s/p colon resection with colostomy for ruptured diverticulitis.  I cannot locate any operative records. Colostomy taken down at some point  Past Medical History  Diagnosis Date  . Diabetes mellitus with neurological manifestation   . Hypertension goal BP (blood pressure) < 130/80   . Hyperlipidemia LDL goal < 100   . Osteopenia     DEXA scan 7/07  . Chronic venous insufficiency   . Morbid obesity   . Diverticulitis     h/o 2-3 episodes in past  . Leg edema     secondary to chronic venous insufficiency  . Cellulitis of left leg     history of, most recent episode 01/09    Past Surgical History  Procedure Laterality Date  . Appendectomy    . Abdominal hysterectomy    . Colectomy      with diverting colsotmy and revision in the 1990's for diverticulitis  . Eye surgery  8/13    left cataract removal & retinal repair    Family History  Problem Relation Age of Onset  . Heart disease Mother   . Heart disease Father   . Heart disease Brother   . Cancer Brother   . Cancer Brother   . Hypertension Sister   . Heart disease Sister     History  Substance Use Topics  . Smoking status:  Never Smoker   . Smokeless tobacco: Never Used  . Alcohol Use: No    Prior to Admission medications   Medication Sig Start Date End Date Taking? Authorizing Provider  ACCU-CHEK AVIVA PLUS test strip  04/29/12  Yes Historical Provider, MD  Alogliptin-Pioglitazone (OSENI) 12.5-30 MG TABS Take 1 tablet by mouth daily.   Yes Historical Provider, MD  atorvastatin (LIPITOR) 20 MG tablet Take 1 tablet (20 mg total) by mouth daily. 11/28/12  Yes Tiffany L Reed, DO  Capsaicin-Menthol-Methyl Sal (CAPSAICIN-METHYL SAL-MENTHOL) 0.025-1-12 % CREA Apply 1 Tube topically 4 (four) times daily. 04/04/13  Yes Mahima Bubba Camp, MD  diphenhydrAMINE (BENADRYL) 25 MG tablet Take 2 tablets (50 mg total) by mouth every 6 (six) hours as needed for itching. 03/19/12  Yes John L Molpus, MD  hydrochlorothiazide (HYDRODIURIL) 25 MG tablet Take 25 mg by mouth daily.   Yes Historical Provider, MD  ibuprofen (ADVIL,MOTRIN) 200 MG tablet Take 400 mg by mouth every 6 (six) hours as needed for pain. For pain   Yes Historical Provider, MD  lisinopril (PRINIVIL,ZESTRIL) 40 MG tablet Take 40 mg by mouth daily.   Yes Historical Provider, MD  metFORMIN (GLUCOPHAGE) 1000 MG tablet TAKE 1 TABLET BY MOUTH TWICE DAILY WITH A MEAL 02/14/13  Yes Tiffany L Reed, DO  metoprolol  succinate (TOPROL-XL) 25 MG 24 hr tablet Take 25 mg by mouth daily.   Yes Historical Provider, MD  omeprazole (PRILOSEC) 20 MG capsule Take 1 capsule (20 mg total) by mouth daily. 01/19/13  Yes Pricilla Larsson, NP  pioglitazone (ACTOS) 30 MG tablet TAKE 1 TABLET BY MOUTH EVERY MORNING 02/12/13  Yes Tiffany L Reed, DO  pregabalin (LYRICA) 75 MG capsule Take 1 capsule (75 mg total) by mouth 3 (three) times daily. 04/04/13  Yes Blanchie Serve, MD    Current Facility-Administered Medications  Medication Dose Route Frequency Provider Last Rate Last Dose  . 0.9 % NaCl with KCl 20 mEq/ L  infusion   Intravenous Continuous Earnstine Regal, PA-C 75 mL/hr at 04/14/13 0551    . [facility]  HOLD] acetaminophen-codeine (TYLENOL #3) 300-30 MG per tablet 1-2 tablet  1-2 tablet Oral Q4H PRN Earnstine Regal, PA-C      . [facility] HOLD] atorvastatin (LIPITOR) tablet 20 mg  20 mg Oral q1800 Orson Eva, MD   20 mg at 04/13/13 1848  . [MAR HOLD] capsicum oleoresin (TRIXAICIN) 0.025 % cream   Topical QID Orson Eva, MD      . [facility] HOLD] ciprofloxacin (CIPRO) IVPB 400 mg  400 mg Intravenous Q12H Earnstine Regal, PA-C 200 mL/hr at 04/13/13 2334 400 mg at 04/14/13 1123  . [facility] HOLD] diphenhydrAMINE (BENADRYL) injection 12.5 mg  12.5 mg Intravenous Q6H PRN Earnstine Regal, PA-C       Or  . [facility] HOLD] diphenhydrAMINE (BENADRYL) 12.5 MG/5ML elixir 12.5 mg  12.5 mg Oral Q6H PRN Earnstine Regal, PA-C      . [facility] HOLD] heparin injection 5,000 Units  5,000 Units Subcutaneous Q8H Earnstine Regal, PA-C   5,000 Units at 04/13/13 2334  . [MAR HOLD] hydrochlorothiazide (HYDRODIURIL) tablet 25 mg  25 mg Oral Daily Orson Eva, MD   25 mg at 04/13/13 1848  . HYDROmorphone (DILAUDID) injection 0.25-0.5 mg  0.25-0.5 mg Intravenous Q5 min PRN Ayesha Mohair, MD      . [facility] HOLD] HYDROmorphone (DILAUDID) injection 0.5-1 mg  0.5-1 mg Intravenous Q2H PRN Earnstine Regal, PA-C   1 mg at 04/13/13 2342  . [facility] HOLD] insulin aspart (novoLOG) injection 0-15 Units  0-15 Units Subcutaneous TID WC Orson Eva, MD      . lactated ringers infusion   Intravenous Continuous Ayesha Mohair, MD 100 mL/hr at 04/14/13 1030    . lactated ringers infusion   Intravenous Continuous Ayesha Mohair, MD      . [facility] HOLD] lisinopril (PRINIVIL,ZESTRIL) tablet 40 mg  40 mg Oral Daily Orson Eva, MD   40 mg at 04/13/13 1848  . [facility] HOLD] metoprolol succinate (TOPROL-XL) 24 hr tablet 25 mg  25 mg Oral Daily Earnstine Regal, PA-C   25 mg at 04/13/13 1412  . [MAR HOLD] ondansetron (ZOFRAN) injection 4 mg  4 mg Intravenous Q6H PRN Earnstine Regal, PA-C   4 mg at 04/14/13 0746  . [MAR HOLD] pantoprazole (PROTONIX) EC tablet 40 mg  40 mg  Oral Daily Earnstine Regal, PA-C   40 mg at 04/13/13 1412  . [MAR HOLD] pregabalin (LYRICA) capsule 75 mg  75 mg Oral TID Earnstine Regal, PA-C   75 mg at 04/13/13 2335  . promethazine (PHENERGAN) injection 6.25-12.5 mg  6.25-12.5 mg Intravenous Q15 min PRN Ayesha Mohair, MD        Allergies as of 04/13/2013 - Review Complete 04/13/2013  Allergen Reaction Noted  . Aspirin Nausea Only   .  Banana Hives 12/30/2011  . Hydrocodone Hives   . Neomycin-polymyxin-gramicidin Itching and Swelling 03/18/2012  . Penicillins Hives and Itching   . Latex Hives and Rash      Review of Systems:    Unobtainable. Patient too drowsy   Physical Exam:  Vital signs in last 24 hours: Temp:  [97.8 F (36.6 C)-98.2 F (36.8 C)] 98 F (36.7 C) (01/23 1338) Pulse Rate:  [58-89] 86 (01/23 1345) Resp:  [15-18] 18 (01/23 1345) BP: (127-151)/(54-76) 151/76 mmHg (01/23 1345) SpO2:  [93 %-96 %] 96 % (01/23 1345) Last BM Date: 04/12/13 General:   Morbidly obese, white female in NAD Head:  Normocephalic and atraumatic. Eyes:   No icterus.   Conjunctiva pink. Ears:  Normal auditory acuity. Neck:  Supple; no masses felt Lungs:  Respirations even and unlabored. Chest clear to auscultation bilaterally.  Heart:  Regular rate and rhythm Abdomen:  Soft, morbidly obese, nondistended, nontender. Hypoactive bowel sounds .  Rectal:  Not performed.  Msk:  Symmetrical without gross deformities.  Extremities:  1+ BLE edema. Neurologic:  Drowsy from recent surgery Skin:  Intact without significant lesions or rashes.   LAB RESULTS:  Recent Labs  04/13/13 0620 04/14/13 0524  WBC 7.0 3.4*  HGB 12.9 12.7  HCT 38.2 38.6  PLT 203 189   BMET  Recent Labs  04/13/13 0620 04/14/13 0524  NA 141 136*  K 3.9 3.9  CL 100 97  CO2 28 24  GLUCOSE 165* 224*  BUN 22 14  CREATININE 0.76 0.71  CALCIUM 9.2 9.0   LFT  Recent Labs  04/14/13 0524  PROT 7.0  ALBUMIN 3.5  AST 1114*  ALT 795*  ALKPHOS 175*    BILITOT 2.6*   PT/INR  Recent Labs  04/14/13 0524  LABPROT 13.4  INR 1.04    STUDIES: US Abdomen Complete  04/13/2013   CLINICAL DATA:  Right upper quadrant abdominal pain.  EXAM: ULTRASOUND ABDOMEN COMPLETE  COMPARISON:  None.  FINDINGS: Gallbladder:  Layering gallstones are present. Wall thickness is upper limits of normal at 2.9 mm. A positive sonographic Percell Miller sign is reported.  Common bile duct:  Diameter: 7.3 mm.  Mild extrahepatic biliary dilation is present.  Liver:  No focal lesion identified. Within normal limits in parenchymal echogenicity.  IVC:  No abnormality visualized.  Pancreas:  Visualized portion unremarkable.  Spleen:  Size and appearance within normal limits.  Right Kidney:  Length: 10.3 cm, within normal limits. Echogenicity within normal limits. No mass or hydronephrosis visualized.  Left Kidney:  Length: 12.0 cm, within normal limits. Echogenicity within normal limits. No mass or hydronephrosis visualized.  Abdominal aorta:  No aneurysm visualized.  Other findings:  None.  IMPRESSION: 1. Cholelithiasis with a positive sonographic Murphy sign suggesting acute cholecystitis. 2. Borderline wall thickening of the gallbladder. 3. Mild extra hepatic biliary dilation.   Electronically Signed   By: Lawrence Santiago M.D.   On: 04/13/2013 07:23    PREVIOUS ENDOSCOPIES:            Information unobtainable from patient but nothing found in EPIC   Impression / Plan:   77. 68 year old female admitted with acute cholecystitis / cholelithiasis, s/p lap chole today.   2. Elevated LFTs and abnormal IOC revealing a distal duct obstruction /stone. She will probably need ERCP with stone extraction. Will need to discuss with patient when she is more alert.   3. Remote history of ruptured diverticulitis resulting in colostomy.  Unable to locate  details of surgery. She had a takedown at some point.   4. Remote history of C-diff  5. Multiple medical problems including, but not necessarily  limited to morbid obesity / diabetes / HTN   Thanks   LOS: 1 day   Tye Savoy  04/14/2013, 1:49 PM   GI ATTENDING  History, laboratories, x-rays reviewed personally. Patient seen and examined personally. Agree with H&P as outlined above. Patient you are practice. Asked to see for choledocholithiasis post cholecystectomy. The patient is somnolent postoperatively. Has incisional pain. Abdomen obese but soft. Will need ERCP. This will need to be done in the OR given her obesity. In the interim, keep her on antibiotics and monitor liver tests. Timing of ERCP not yet determined. We'll follow with you. Thanks  Docia Chuck. Geri Seminole., M.D. [facility] Division of Gastroenterology

## 2013-04-14 NOTE — Plan of Care (Signed)
Problem: Diagnosis - Type of Surgery Goal: General Surgical Patient Education (See Patient Education module for education specifics) Outcome: Completed/Met Date Met:  04/14/13 Laparoscopic vs. Open cholecystectomy

## 2013-04-14 NOTE — Anesthesia Preprocedure Evaluation (Signed)
Anesthesia Evaluation  Patient identified by MRN, date of birth, ID band Patient awake    Reviewed: Allergy & Precautions, H&P , NPO status , Patient's Chart, lab work & pertinent test results  Airway Mallampati: III TM Distance: >3 FB Neck ROM: Full    Dental  (+) Missing, Dental Advisory Given and Poor Dentition,    Pulmonary neg pulmonary ROS,  breath sounds clear to auscultation  Pulmonary exam normal       Cardiovascular hypertension, Pt. on medications + Peripheral Vascular Disease Rhythm:Regular Rate:Normal     Neuro/Psych negative neurological ROS  negative psych ROS   GI/Hepatic negative GI ROS, Neg liver ROS, GERD-  ,  Endo/Other  diabetes, Type 2, Oral Hypoglycemic AgentsMorbid obesity  Renal/GU negative Renal ROS  negative genitourinary   Musculoskeletal negative musculoskeletal ROS (+)   Abdominal (+) + obese,   Peds  Hematology negative hematology ROS (+)   Anesthesia Other Findings   Reproductive/Obstetrics                           Anesthesia Physical Anesthesia Plan  ASA: III  Anesthesia Plan: General   Post-op Pain Management:    Induction: Intravenous  Airway Management Planned: Oral ETT  Additional Equipment:   Intra-op Plan:   Post-operative Plan: Extubation in OR  Informed Consent: I have reviewed the patients History and Physical, chart, labs and discussed the procedure including the risks, benefits and alternatives for the proposed anesthesia with the patient or authorized representative who has indicated his/her understanding and acceptance.   Dental advisory given  Plan Discussed with: CRNA  Anesthesia Plan Comments:         Anesthesia Quick Evaluation

## 2013-04-14 NOTE — Progress Notes (Signed)
Utilization review completed.  

## 2013-04-14 NOTE — Anesthesia Postprocedure Evaluation (Signed)
Anesthesia Post Note  Patient: Debra [name]  Procedure(s) Performed: Procedure(s) (LRB): LAPAROSCOPIC CHOLECYSTECTOMY WITH INTRAOPERATIVE CHOLANGIOGRAM (N/A) LAPAROSCOPIC LYSIS OF ADHESIONS (N/A)  Anesthesia type: General  Patient location: PACU  Post pain: Pain level controlled  Post assessment: Post-op Vital signs reviewed  Last Vitals:  Filed Vitals:   04/14/13 1400  BP:   Pulse: 83  Temp:   Resp: 20    Post vital signs: Reviewed  Level of consciousness: sedated  Complications: No apparent anesthesia complications

## 2013-04-14 NOTE — Op Note (Signed)
Patient Name:           Debra [name]   Date of Surgery:        04/14/2013  Pre op Diagnosis:      Acute cholecystitis with cholelithiasis  Post op Diagnosis:    Acute cholecystitis with cholelithiasis Choledocholithiasis with obstructed CBD Extensive intra-abdominal adhesions  Procedure:                 Laparoscopic lysis of adhesions, extensive, 45 minutes Laparoscopic cholecystectomy with IOC  Surgeon:                     Edsel Petrin. Dalbert Batman, M.D., FACS  Assistant:                      Ralene Ok, M.D.  Operative Indications:  This is a 68 year old Caucasian female. She was admitted yesterday with acute onset of right upper quadrant pain nausea and vomiting and normal liver function tests and normal WBC. She was tender in the right upper quadrant. Ultrasound showed layering gallstones and borderline thickened gallbladder wall. CBD measures 7.3 mm.  Comorbidities include hypertension, diabetes mellitus, diabetic neuropathy, hyperlipidemia, obesity. She has a history of a two-stage colon resection for ruptured diverticulitis with a colostomy in the right upper quadrant. As incarcerated ventral hernia containing omentum. She has been on antibiotics overnight. This morning her liver function tests showed Total bilirubin 2.9, AST 1114.  Operative Findings:       There were extensive adhesions throughout the lower and mid abdomen. It took 45 minutes to take all these adhesions down before I could get the trocars in. The gallbladder was edematous but was not exudative. The liver looked normal. The cholangiogram was abnormal, and showed a distal obstruction and distal stone. This was reviewed with radiology. We were able to complete the case laparoscopically.  Procedure in Detail:          Following the induction of general endotracheal anesthesia the patient's abdomen was prepped and draped in a sterile fashion. Intravenous antibodies have been given preoperatively. Surgical time out was  performed. 0.5% Marcaine with epinephrine was used as local infiltration anesthetic. A 5 mm optical trocar was placed in the left subcostal position. I was able to enter atraumatically and create a pneumoperitoneum. I encountered  very extensive adhesions in the lower and mid abdomen but I could see under the falciform ligament and could see the gallbladder. A second 5 mm trocar was placed in the left abdomen under direct vision. With this I took down a lot of the adhesions with blunt dissection and sharp dissection. These were mostly omental adhesions but they were very extensive this took a long time. I ultimately got an 11 mm trocar in the subxiphoid region, 11 mm trocar just above and to the  right of the midline above the umbilicus and two 5 mm trocars in the right upper quadrant. The gallbladder was tense and we had to use a suction trocar to evacuate it.  The gallbladder contained  dark bile and small stones present. We took  a lot of adhesions down off of the gallbladder. We retracted the infundibulum laterally. We dissected out the cystic duct and cystic artery. The cystic artery was isolated as it went onto the wall of the  gallbladder, controlled with multiple and divided. I then created a large window behind the cystic duct. A cholangiogram catheter was inserted into the cystic duct and a cholangiogram was obtained  using the C-arm. The cholangiogram showed a normal intrahepatic and extrahepatic bile duct anatomy, but the ducts were slightly dilated, the distal common bile duct was obstructed due to a stone. The cholangiogram catheter was removed. The cystic duct was secured with multiple clips and divided. The posterior branch of the cystic artery was identified as it went up the gallbladder, controlled it with metal clips and divided. The gallbladder was dissected from its bed, placed a specimen bag and removed. The operative field was inspected and copiously irrigated. The field was  completely clean  and there was no bleeding and no bile  leak whatsoever.    I inspected the bed of the gallbladder one more time. I inspected all the areas of adhesio lysis and everything looked clean no sign of any intestinal injury. The liver looked healthy. The trocars were removed and there was no bleeding. The pneumoperitoneum was evacuated. Prior to evacuating the  pneumoperitoneum I closed the supraumbilical port site with an Endo Close device and 0 Vicryl sutures with a figure-of-eight suture. This provided good closure. The skin incisions were irrigated and then closed with subcuticular sutures of 4-0 Monocryl and Dermabond. The patient tolerated the procedure well was taken to the recovery room in stable condition. EBL 20 cc. Counts correct. Complications none.     Edsel Petrin. Dalbert Batman, M.D., FACS General and Minimally Invasive Surgery Breast and Colorectal Surgery  04/14/2013 1:35 PM

## 2013-04-14 NOTE — Transfer of Care (Signed)
Immediate Anesthesia Transfer of Care Note  Patient: Debra [name]  Procedure(s) Performed: Procedure(s) (LRB): LAPAROSCOPIC CHOLECYSTECTOMY WITH INTRAOPERATIVE CHOLANGIOGRAM (N/A) LAPAROSCOPIC LYSIS OF ADHESIONS (N/A)  Patient Location: PACU  Anesthesia Type: General  Level of Consciousness: sedated, patient cooperative and responds to stimulation  Airway & Oxygen Therapy: Patient Spontanous Breathing and Patient connected to face mask oxgen  Post-op Assessment: Report given to PACU RN and Post -op Vital signs reviewed and stable  Post vital signs: Reviewed and stable  Complications: No apparent anesthesia complications

## 2013-04-14 NOTE — ED Provider Notes (Signed)
Medical screening examination/treatment/procedure(s) were conducted as a shared visit with non-physician practitioner(s) and myself.  I personally evaluated the patient during the encounter.  EKG Interpretation    Date/Time:    Ventricular Rate:    PR Interval:    QRS Duration:   QT Interval:    QTC Calculation:   R Axis:     Text Interpretation:              Patient with 24 hours right upper quadrant pain and nausea. Ultrasounds aren't for acute cholecystitis. Surgery to admit   Julianne Rice, MD 04/14/13 [phone]

## 2013-04-14 NOTE — Preoperative (Signed)
Beta Blockers   Reason not to administer Beta Blockers:Took Metoprolol 25 mg at 04/13/13 at 1412. Has had Nausea and vomiting this am 04/14/13

## 2013-04-14 NOTE — Progress Notes (Signed)
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 Pt is confused post-operatively. Complains of mild abd soreness.  Objective: Filed Vitals:   04/14/13 1415 04/14/13 1430 04/14/13 1447 04/14/13 1540  BP: 146/85 146/85 151/80 149/69  Pulse: 78 78 75 87  Temp:  98 F (36.7 C) 97.5 F (36.4 C) 97.6 F (36.4 C)  TempSrc:   Oral Oral  Resp: 17 15 15 16   Height:      Weight:      SpO2: 94% 94% 94% 93%    Intake/Output Summary (Last 24 hours) at 04/14/13 1543 Last data filed at 04/14/13 1430  Gross per 24 hour  Intake 3156.25 ml  Output   1800 ml  Net 1356.25 ml   Filed Weights   04/13/13 0555  Weight: 136.079 kg (300 lb)    Exam:   General:  Awake, in nad  Cardiovascular: regular, s1, s2  Respiratory: normal resp effort, no wheezing  Abdomen: soft, nondistended  Musculoskeletal: perfused, no clubbing   Data Reviewed: Basic Metabolic Panel:  Recent Labs Lab 04/13/13 0620 04/14/13 0524  NA 141 136*  K 3.9 3.9  CL 100 97  CO2 28 24  GLUCOSE 165* 224*  BUN 22 14  CREATININE 0.76 0.71  CALCIUM 9.2 9.0  MG  --  1.2*   Liver Function Tests:  Recent Labs Lab 04/13/13 0620 04/14/13 0524  AST 15 1114*  ALT 16 795*  ALKPHOS 78 175*  BILITOT 0.4 2.6*  PROT 7.0 7.0  ALBUMIN  3.7 3.5    Recent Labs Lab 04/13/13 0620 04/14/13 0524  LIPASE 28 29   No results found for this basename: AMMONIA,  in the last 168 hours CBC:  Recent Labs Lab 04/13/13 0620 04/14/13 0524  WBC 7.0 3.4*  NEUTROABS 3.4  --   HGB 12.9 12.7  HCT 38.2 38.6  MCV 92.9 93.0  PLT 203 189   Cardiac Enzymes: No results found for this basename: CKTOTAL, CKMB, CKMBINDEX, TROPONINI,  in the last 168 hours BNP (last 3 results) No results found for this basename: PROBNP,  in the last 8760 hours CBG:  Recent Labs Lab 04/14/13 0847  GLUCAP 182*    Recent Results (from the past 240 hour(s))  SURGICAL PCR SCREEN     Status: None   Collection Time    04/14/13  6:24 AM      Result Value Range Status   MRSA, PCR NEGATIVE  NEGATIVE Final   Staphylococcus aureus NEGATIVE  NEGATIVE Final   Comment:            The Xpert SA Assay (FDA     approved for NASAL specimens     in patients over 25 years of age),     is one component of     a comprehensive surveillance     program.  Test performance has     been validated by [facility] for patients greater     than or equal to 12 year old.     It is not intended     to diagnose infection nor to     guide or monitor treatment.     Studies: Dg Cholangiogram Operative  04/14/2013   CLINICAL DATA:  Cholelithiasis and cholecystitis.  EXAM: INTRAOPERATIVE CHOLANGIOGRAM  TECHNIQUE: Cholangiographic images from the C-arm fluoroscopic device were submitted for interpretation post-operatively. Please see the procedural report for the amount of contrast and the fluoroscopy time utilized.  COMPARISON:  Ultrasound dated 04/13/2013  FINDINGS: There are multiple tiny stones in the distal common bile duct. There is no flow contrast into the duodenum. There are dilated intra and extrahepatic bile ducts.  IMPRESSION: Multiple common bile duct stones. Obstruction of the distal common bile duct.   Electronically Signed   By: Rozetta Nunnery M.D.   On:  04/14/2013 14:55   US Abdomen Complete  04/13/2013   CLINICAL DATA:  Right upper quadrant abdominal pain.  EXAM: ULTRASOUND ABDOMEN COMPLETE  COMPARISON:  None.  FINDINGS: Gallbladder:  Layering gallstones are present. Wall thickness is upper limits of normal at 2.9 mm. A positive sonographic Percell Miller sign is reported.  Common bile duct:  Diameter: 7.3 mm.  Mild extrahepatic biliary dilation is present.  Liver:  No focal lesion identified. Within normal limits in parenchymal echogenicity.  IVC:  No abnormality visualized.  Pancreas:  Visualized portion unremarkable.  Spleen:  Size and appearance within normal limits.  Right Kidney:  Length: 10.3 cm, within normal limits. Echogenicity within normal limits. No mass or hydronephrosis visualized.  Left Kidney:  Length: 12.0 cm, within normal limits. Echogenicity within normal limits. No mass or hydronephrosis visualized.  Abdominal aorta:  No aneurysm visualized.  Other findings:  None.  IMPRESSION: 1. Cholelithiasis with a positive sonographic Murphy sign suggesting acute cholecystitis. 2. Borderline wall thickening of the gallbladder. 3. Mild extra hepatic biliary dilation.   Electronically Signed   By: Lawrence Santiago M.D.   On: 04/13/2013 07:23    Scheduled Meds: . capsicum oleoresin   Topical QID  . [START ON 04/15/2013] chlorhexidine  1 application Topical Once  . ciprofloxacin  400 mg Intravenous Q12H  . heparin  5,000 Units Subcutaneous Q8H  . hydrochlorothiazide  25 mg Oral Daily  . HYDROmorphone      . HYDROmorphone      . insulin aspart  0-15 Units Subcutaneous TID WC  . lisinopril  40 mg Oral Daily  . metoprolol succinate  25 mg Oral Daily  . pantoprazole  40 mg Oral Daily  . pregabalin  75 mg Oral TID   Continuous Infusions: . lactated ringers 1,000 mL (04/14/13 1030)  . lactated ringers      Active Problems:   HYPERLIPIDEMIA   HYPERTENSION   Diabetes mellitus with neurological manifestation   Hypertension goal BP (blood pressure) <  130/80   Chronic venous insufficiency   Symptomatic cholelithiasis   Choledocholithiasis with acute cholecystitis  Time spent: 41min  CH[facility], [place] Pager [phone]. If 7PM-7AM, please contact night-coverage at [email or web address], password [facility] 04/14/2013, 3:43 PM  LOS: 1 day

## 2013-04-14 NOTE — Progress Notes (Addendum)
Gen. Surgery:  Appreciate thorough medical evaluation by Dr. Shanon Brow Tat.  Patient is stable but still has right upper quadrant pain and nausea. Exam reveals tenderness and guarding in the right upper quadrant. Complex abdominal incisions, right side and midline. Ventral hernia in the periumbilical area  Not distended.  Morning laboratory pending   Plan: Proceed with cholecystectomy today. We will attempt this laparoscopically, perhaps with an optical port in the left upper quadrant. Suspect acute inflammation.  Complex prior surgery and abdominal wall anatomy.   She is high risk for conversion to open surgery and she is aware of that. Once again, I have discussed indications, details, techniques, and numerous risk of the surgery with her. She understands all these issues. All her questions were answered. She agrees with this plan.  Debra [name]. Dalbert Batman, M.D., [facility] General and Minimally invasive Surgery Breast and Colorectal Surgery Office:   [phone] Pager:   [phone]

## 2013-04-15 LAB — COMPREHENSIVE METABOLIC PANEL
ALT: 518 U/L — ABNORMAL HIGH (ref 0–35)
AST: 294 U/L — AB (ref 0–37)
Albumin: 3.2 g/dL — ABNORMAL LOW (ref 3.5–5.2)
Alkaline Phosphatase: 152 U/L — ABNORMAL HIGH (ref 39–117)
BUN: 8 mg/dL (ref 6–23)
CO2: 29 meq/L (ref 19–32)
Calcium: 8.7 mg/dL (ref 8.4–10.5)
Chloride: 96 mEq/L (ref 96–112)
Creatinine, Ser: 0.76 mg/dL (ref 0.50–1.10)
GFR calc Af Amer: >90 mL/min (ref >90)
GFR, EST NON AFRICAN AMERICAN: 86 mL/min — AB (ref >90)
Glucose, Bld: 202 mg/dL — ABNORMAL HIGH (ref 70–99)
Potassium: 3.3 mEq/L — ABNORMAL LOW (ref 3.7–5.3)
Sodium: 138 mEq/L (ref 137–147)
TOTAL PROTEIN: 6.5 g/dL (ref 6.0–8.3)
Total Bilirubin: 1 mg/dL (ref 0.3–1.2)

## 2013-04-15 LAB — CBC
HCT: 37.1 % (ref 36.0–46.0)
Hemoglobin: 12.5 g/dL (ref 12.0–15.0)
MCH: 31 pg (ref 26.0–34.0)
MCHC: 33.7 g/dL (ref 30.0–36.0)
MCV: 92.1 fL (ref 78.0–100.0)
Platelets: 192 10*3/uL (ref 150–400)
RBC: 4.03 MIL/uL (ref 3.87–5.11)
RDW: 13.7 % (ref 11.5–15.5)
WBC: 9.7 10*3/uL (ref 4.0–10.5)

## 2013-04-15 LAB — GLUCOSE, CAPILLARY
GLUCOSE-CAPILLARY: 178 mg/dL — AB (ref 70–99)
GLUCOSE-CAPILLARY: 203 mg/dL — AB (ref 70–99)
GLUCOSE-CAPILLARY: 153 mg/dL — AB (ref 70–99)
Glucose-Capillary: 190 mg/dL — ABNORMAL HIGH (ref 70–99)

## 2013-04-15 LAB — AMYLASE: Amylase: 43 U/L (ref 0–105)

## 2013-04-15 MED ORDER — INSULIN GLARGINE 100 UNIT/ML ~~LOC~~ SOLN
10.0000 [IU] | Freq: Every day | SUBCUTANEOUS | Status: DC
  Administered 2013-04-15: 10 [IU] via SUBCUTANEOUS
  Filled 2013-04-15 (×2): qty 0.1

## 2013-04-15 MED ORDER — KCL IN DEXTROSE-NACL 20-5-0.45 MEQ/L-%-% IV SOLN
INTRAVENOUS | Status: DC
  Administered 2013-04-15 – 2013-04-16 (×3): via INTRAVENOUS
  Filled 2013-04-15 (×6): qty 1000

## 2013-04-15 NOTE — Progress Notes (Signed)
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 No acute events noted.  Objective: Filed Vitals:   04/14/13 2100 04/15/13 0242 04/15/13 0500 04/15/13 1044  BP: 146/84 158/82 147/81 130/72  Pulse: 87 89 81 84  Temp: 97.9 F (36.6 C) 97.9 F (36.6 C) 98.3 F (36.8 C) 98.6 F (37 C)  TempSrc: Oral Oral Oral Oral  Resp: 16 16 16 16   Height:      Weight:      SpO2: 94% 93% 93% 94%    Intake/Output Summary (Last 24 hours) at 04/15/13 1343 Last data filed at 04/15/13 0913  Gross per 24 hour  Intake   2430 ml  Output   2875 ml  Net   -445 ml   Filed Weights   04/13/13 0555  Weight: 136.079 kg (300 lb)    Exam:   General:  Awake, in nad  Cardiovascular: regular, s1, s2  Respiratory: normal resp effort, no wheezing  Abdomen: soft, nondistended  Musculoskeletal: perfused, no clubbing   Data Reviewed: Basic Metabolic Panel:  Recent Labs Lab 04/13/13 0620 04/14/13 0524 04/15/13 0526  NA 141 136* 138  K 3.9 3.9 3.3*  CL 100 97 96  CO2 28 24 29   GLUCOSE 165* 224* 202*  BUN 22 14 8   CREATININE 0.76 0.71 0.76  CALCIUM 9.2 9.0 8.7  MG  --  1.2*  --    Liver  Function Tests:  Recent Labs Lab 04/13/13 0620 04/14/13 0524 04/15/13 0526  AST 15 1114* 294*  ALT 16 795* 518*  ALKPHOS 78 175* 152*  BILITOT 0.4 2.6* 1.0  PROT 7.0 7.0 6.5  ALBUMIN 3.7 3.5 3.2*    Recent Labs Lab 04/13/13 0620 04/14/13 0524 04/14/13 1625 04/15/13 0526  LIPASE 28 29 18   --   AMYLASE  --   --   --  43   No results found for this basename: AMMONIA,  in the last 168 hours CBC:  Recent Labs Lab 04/13/13 0620 04/14/13 0524 04/15/13 0526  WBC 7.0 3.4* 9.7  NEUTROABS 3.4  --   --   HGB 12.9 12.7 12.5  HCT 38.2 38.6 37.1  MCV 92.9 93.0 92.1  PLT 203 189 192   Cardiac Enzymes: No results found for this basename: CKTOTAL, CKMB, CKMBINDEX, TROPONINI,  in the last 168 hours BNP (last 3 results) No results found for this basename: PROBNP,  in the last 8760 hours CBG:  Recent Labs Lab 04/14/13 0847 04/14/13 1639 04/14/13 2151 04/15/13 0725 04/15/13 1117  GLUCAP 182* 191* 213* 178* 203*    Recent Results (from the past 240 hour(s))  SURGICAL PCR SCREEN     Status: None   Collection Time    04/14/13  6:24 AM      Result Value Range Status  MRSA, PCR NEGATIVE  NEGATIVE Final   Staphylococcus aureus NEGATIVE  NEGATIVE Final   Comment:            The Xpert SA Assay (FDA     approved for NASAL specimens     in patients over 3 years of age),     is one component of     a comprehensive surveillance     program.  Test performance has     been validated by [employer] for patients greater     than or equal to 80 year old.     It is not intended     to diagnose infection nor to     guide or monitor treatment.     Studies: Dg Cholangiogram Operative  04/14/2013   CLINICAL DATA:  Cholelithiasis and cholecystitis.  EXAM: INTRAOPERATIVE CHOLANGIOGRAM  TECHNIQUE: Cholangiographic images from the C-arm fluoroscopic device were submitted for interpretation post-operatively. Please see the procedural report for the amount of contrast and the  fluoroscopy time utilized.  COMPARISON:  Ultrasound dated 04/13/2013  FINDINGS: There are multiple tiny stones in the distal common bile duct. There is no flow contrast into the duodenum. There are dilated intra and extrahepatic bile ducts.  IMPRESSION: Multiple common bile duct stones. Obstruction of the distal common bile duct.   Electronically Signed   By: Rozetta Nunnery M.D.   On: 04/14/2013 14:55    Scheduled Meds: . capsicum oleoresin   Topical QID  . chlorhexidine  1 application Topical Once  . ciprofloxacin  400 mg Intravenous Q12H  . heparin  5,000 Units Subcutaneous Q8H  . hydrochlorothiazide  25 mg Oral Daily  . insulin aspart  0-15 Units Subcutaneous TID WC  . insulin glargine  10 Units Subcutaneous Daily  . lisinopril  40 mg Oral Daily  . metoprolol succinate  25 mg Oral Daily  . pantoprazole  40 mg Oral Daily  . pregabalin  75 mg Oral TID   Continuous Infusions: . dextrose 5 % and 0.45 % NaCl with KCl 20 mEq/L 50 mL/hr at 04/15/13 5462    Active Problems:   HYPERLIPIDEMIA   HYPERTENSION   Diabetes mellitus with neurological manifestation   Hypertension goal BP (blood pressure) < 130/80   Chronic venous insufficiency   Symptomatic cholelithiasis   Choledocholithiasis with acute cholecystitis  Time spent: 70min  Debra [name], [place] [facility] Pager [phone]. If 7PM-7AM, please contact night-coverage at [email or web address], password [facility] 04/15/2013, 1:43 PM  LOS: 2 days

## 2013-04-15 NOTE — Progress Notes (Signed)
HISTORY OF PRESENT ILLNESS:  Debra [name] is a 68 y.o. female admitted with symptomatic gallstone disease. Status post laparoscopic cholecystectomy with intraoperative cholangiogram demonstrated multiple stones. Feeling better today with less abdominal pain. Liver tests (reviewed) much better as well. Hungry, otherwise no new complaints  REVIEW OF SYSTEMS:  All non-GI ROS negative except for fatigue  Past Medical History  Diagnosis Date  . Diabetes mellitus with neurological manifestation   . Hypertension goal BP (blood pressure) < 130/80   . Hyperlipidemia LDL goal < 100   . Osteopenia     DEXA scan 7/07  . Chronic venous insufficiency   . Morbid obesity   . Diverticulitis     h/o 2-3 episodes in past  . Leg edema     secondary to chronic venous insufficiency  . Cellulitis of left leg     history of, most recent episode 01/09    Past Surgical History  Procedure Laterality Date  . Appendectomy    . Abdominal hysterectomy    . Colectomy      with diverting colsotmy and revision in the 1990's for diverticulitis  . Eye surgery  8/13    left cataract removal & retinal repair    Social History Debra [name]  reports that she has never smoked. She has never used smokeless tobacco. She reports that she does not drink alcohol or use illicit drugs.  family history includes Cancer in her brother and brother; Heart disease in her brother, father, mother, and sister; Hypertension in her sister.  Allergies  Allergen Reactions  . Aspirin Nausea Only    And causes bruises  . Banana Hives  . Hydrocodone Hives  . Neomycin-Polymyxin-Gramicidin Itching and Swelling    Eye drops caused swelling in face and rash and itching on arms and neck  . Penicillins Hives and Itching  . Latex Hives and Rash       PHYSICAL EXAMINATION: Vital signs: BP 130/72  Pulse 84  Temp(Src) 98.6 F (37 C) (Oral)  Resp 16  Ht 5\' 4"  (1.626 m)  Wt 300 lb (136.079 kg)  BMI 51.47 kg/m2  SpO2 94%   LMP 07/02/1973 General: Well-developed, well-nourished, no acute distress HEENT: Sclerae are anicteric, conjunctiva pink. Oral mucosa intact Lungs: Clear Heart: Regular Abdomen: soft, massively obese, incisional tenderness, nondistended, no obvious ascites, no peritoneal signs, normal bowel sounds. No organomegaly. Extremities: 1+ edema Psychiatric: alert and oriented x3. Cooperative     ASSESSMENT:  #1. Choledocholithiasis status post laparoscopic cholecystectomy. Stable without cholangitis #2. Morbid obesity  PLAN:  #1. Continue antibiotics #2. Continue to monitor liver tests #3. May have diet-low-fat #4. ERCP with sphincterotomy and stone extraction. Patient is high-risk due to her morbid obesity. This plans for procedure in operating room with general anesthesia Monday.The nature of the procedure, as well as the risks (pancreatitis, perforation, bleeding, infection), benefits, and alternatives were carefully and thoroughly reviewed with the patient. Ample time for discussion and questions allowed. The patient understood, was satisfied, and agreed to proceed. Dr. Harlow Asa aware  Debra Chuck. Debra [name]., M.D. [facility] Division of Gastroenterology

## 2013-04-15 NOTE — Progress Notes (Signed)
Patient ID: Debra [name], female   DOB: 31-Jul-1945, 68 y.o.   MRN: [id number]  General Surgery - [facility] - Progress Note  POD# 1  Subjective: Patient awake and alert.  Family at bedside.  Has been up to chair.  Complains of abd pain.  Tolerating clear liquid diet.  Objective: Vital signs in last 24 hours: Temp:  [97.5 F (36.4 C)-98.7 F (37.1 C)] 98.3 F (36.8 C) (01/24 0500) Pulse Rate:  [75-91] 81 (01/24 0500) Resp:  [15-20] 16 (01/24 0500) BP: (145-158)/(69-85) 147/81 mmHg (01/24 0500) SpO2:  [93 %-96 %] 93 % (01/24 0500) Last BM Date: 04/12/13  Intake/Output from previous day: 01/23 0701 - 01/24 0700 In: 3790 [P.O.:240; I.V.:3550] Out: 9675 [Urine:3175]  Exam: HEENT - clear, not icteric Neck - soft Chest - clear bilaterally Cor - RRR, no murmur Abd - soft, obese; wounds clear and dry with dermabond; BS present Ext - no significant edema Neuro - grossly intact, no focal deficits  Lab Results:   Recent Labs  04/14/13 0524 04/15/13 0526  WBC 3.4* 9.7  HGB 12.7 12.5  HCT 38.6 37.1  PLT 189 192     Recent Labs  04/14/13 0524 04/15/13 0526  NA 136* 138  K 3.9 3.3*  CL 97 96  CO2 24 29  GLUCOSE 224* 202*  BUN 14 8  CREATININE 0.71 0.76  CALCIUM 9.0 8.7    Studies/Results: Dg Cholangiogram Operative  04/14/2013   CLINICAL DATA:  Cholelithiasis and cholecystitis.  EXAM: INTRAOPERATIVE CHOLANGIOGRAM  TECHNIQUE: Cholangiographic images from the C-arm fluoroscopic device were submitted for interpretation post-operatively. Please see the procedural report for the amount of contrast and the fluoroscopy time utilized.  COMPARISON:  Ultrasound dated 04/13/2013  FINDINGS: There are multiple tiny stones in the distal common bile duct. There is no flow contrast into the duodenum. There are dilated intra and extrahepatic bile ducts.  IMPRESSION: Multiple common bile duct stones. Obstruction of the distal common bile duct.   Electronically Signed    By: Rozetta Nunnery M.D.   On: 04/14/2013 14:55    Assessment / Plan: 1.  Statu post lap chole with IOC  CBD stones with obstruction on IOC  Transaminases improved but still elevated, Tbili 1.0  GI consultant to discuss timing of ERCP (LHC)  Earnstine Regal, MD, [facility] Office: [phone]  04/15/2013

## 2013-04-16 ENCOUNTER — Encounter (HOSPITAL_COMMUNITY): Payer: Self-pay | Admitting: Anesthesiology

## 2013-04-16 LAB — GLUCOSE, CAPILLARY
Glucose-Capillary: 214 mg/dL — ABNORMAL HIGH (ref 70–99)
Glucose-Capillary: 199 mg/dL — ABNORMAL HIGH (ref 70–99)
Glucose-Capillary: 167 mg/dL — ABNORMAL HIGH (ref 70–99)
Glucose-Capillary: 202 mg/dL — ABNORMAL HIGH (ref 70–99)

## 2013-04-16 MED ORDER — INSULIN GLARGINE 100 UNIT/ML ~~LOC~~ SOLN
15.0000 [IU] | Freq: Every day | SUBCUTANEOUS | Status: DC
  Administered 2013-04-16: 15 [IU] via SUBCUTANEOUS
  Filled 2013-04-16 (×2): qty 0.15

## 2013-04-16 MED ORDER — INDOMETHACIN 50 MG RE SUPP
100.0000 mg | Freq: Once | RECTAL | Status: AC
  Administered 2013-04-17: 100 mg via RECTAL
  Filled 2013-04-16: qty 2

## 2013-04-16 MED ORDER — INSULIN ASPART 100 UNIT/ML ~~LOC~~ SOLN
0.0000 [IU] | Freq: Every day | SUBCUTANEOUS | Status: DC
  Administered 2013-04-16: 2 [IU] via SUBCUTANEOUS

## 2013-04-16 MED ORDER — INSULIN ASPART 100 UNIT/ML ~~LOC~~ SOLN
0.0000 [IU] | Freq: Three times a day (TID) | SUBCUTANEOUS | Status: DC
  Administered 2013-04-16: 4 [IU] via SUBCUTANEOUS
  Administered 2013-04-17: 7 [IU] via SUBCUTANEOUS
  Administered 2013-04-17: 3 [IU] via SUBCUTANEOUS
  Administered 2013-04-18 (×2): 4 [IU] via SUBCUTANEOUS
  Administered 2013-04-18: 11 [IU] via SUBCUTANEOUS
  Administered 2013-04-19: 4 [IU] via SUBCUTANEOUS

## 2013-04-16 NOTE — Progress Notes (Signed)
Patient ID: Debra [name], female   DOB: 1946/03/21, 68 y.o.   MRN: [id number]  General Surgery - [facility] - Progress Note  POD# 2  Subjective: Patient up in chair.  Family in room.  Mild RUQ pain.  Objective: Vital signs in last 24 hours: Temp:  [97.8 F (36.6 C)-98.7 F (37.1 C)] 97.8 F (36.6 C) (01/25 0500) Pulse Rate:  [84-96] 86 (01/25 0500) Resp:  [16] 16 (01/25 0500) BP: (130-164)/(72-90) 133/77 mmHg (01/25 0500) SpO2:  [93 %-96 %] 96 % (01/25 0500) Last BM Date: 04/12/13  Intake/Output from previous day: 01/24 0701 - 01/25 0700 In: 2651.7 [P.O.:720; I.V.:931.7; IV Piggyback:1000] Out: 2700 [Urine:2700]  Exam: HEENT - clear, not icteric Neck - soft Chest - clear bilaterally, but shallow secondary to abdominal pain Cor - RRR, no murmur Abd - soft, obese, mild tenderness RUQ, incisions clear and dry  Lab Results:   Recent Labs  04/14/13 0524 04/15/13 0526  WBC 3.4* 9.7  HGB 12.7 12.5  HCT 38.6 37.1  PLT 189 192     Recent Labs  04/14/13 0524 04/15/13 0526  NA 136* 138  K 3.9 3.3*  CL 97 96  CO2 24 29  GLUCOSE 224* 202*  BUN 14 8  CREATININE 0.71 0.76  CALCIUM 9.0 8.7    Studies/Results: Dg Cholangiogram Operative  04/14/2013   CLINICAL DATA:  Cholelithiasis and cholecystitis.  EXAM: INTRAOPERATIVE CHOLANGIOGRAM  TECHNIQUE: Cholangiographic images from the C-arm fluoroscopic device were submitted for interpretation post-operatively. Please see the procedural report for the amount of contrast and the fluoroscopy time utilized.  COMPARISON:  Ultrasound dated 04/13/2013  FINDINGS: There are multiple tiny stones in the distal common bile duct. There is no flow contrast into the duodenum. There are dilated intra and extrahepatic bile ducts.  IMPRESSION: Multiple common bile duct stones. Obstruction of the distal common bile duct.   Electronically Signed   By: Rozetta Nunnery M.D.   On: 04/14/2013 14:55    Assessment / Plan: 1.   Status post lap chole with IOC  CBD stones with obstruction  Plan ERCP in OR tomorrow per GI  Discussed with patient and family - they wish to proceed  Will follow with you  Earnstine Regal, MD, [facility] Office: [phone]  04/16/2013

## 2013-04-16 NOTE — Progress Notes (Signed)
HISTORY OF PRESENT ILLNESS:  Debra [name] is a 67 y.o. female admitted with symptomatic cholelithiasis status post cholecystectomy. Intraoperative cholangiogram revealed choledocholithiasis. Clinical stable. LFTs improved. Abdominal pain less. Hungry. Husband in room.  REVIEW OF SYSTEMS:  All non-GI ROS negative  Past Medical History  Diagnosis Date  . Diabetes mellitus with neurological manifestation   . Hypertension goal BP (blood pressure) < 130/80   . Hyperlipidemia LDL goal < 100   . Osteopenia     DEXA scan 7/07  . Chronic venous insufficiency   . Morbid obesity   . Diverticulitis     h/o 2-3 episodes in past  . Leg edema     secondary to chronic venous insufficiency  . Cellulitis of left leg     history of, most recent episode 01/09    Past Surgical History  Procedure Laterality Date  . Appendectomy    . Abdominal hysterectomy    . Colectomy      with diverting colsotmy and revision in the 1990's for diverticulitis  . Eye surgery  8/13    left cataract removal & retinal repair    Social History Debra [name]  reports that she has never smoked. She has never used smokeless tobacco. She reports that she does not drink alcohol or use illicit drugs.  family history includes Cancer in her brother and brother; Heart disease in her brother, father, mother, and sister; Hypertension in her sister.  Allergies  Allergen Reactions  . Aspirin Nausea Only    And causes bruises  . Banana Hives  . Hydrocodone Hives  . Neomycin-Polymyxin-Gramicidin Itching and Swelling    Eye drops caused swelling in face and rash and itching on arms and neck  . Penicillins Hives and Itching  . Latex Hives and Rash       PHYSICAL EXAMINATION: Vital signs: BP 134/79  Pulse 81  Temp(Src) 98.4 F (36.9 C) (Oral)  Resp 16  Ht 5' 4" (1.626 m)  Wt 300 lb (136.079 kg)  BMI 51.47 kg/m2  SpO2 91%  LMP 07/02/1973 General: Well-developed, well-nourished, no acute distress HEENT:  Sclerae are anicteric, conjunctiva pink. Oral mucosa intact Lungs: Clear Heart: Regular Abdomen: soft, obese, nontender, nondistended, no obvious ascites, no peritoneal signs, normal bowel sounds. No organomegaly. Extremities: No edema Psychiatric: alert and oriented x3. Cooperative     ASSESSMENT:  #1. Choledocholithiasis. Plan ERCP with sphincterotomy and stone extraction tomorrow at 8 AM with general anesthesia.The nature of the procedure, as well as the risks, benefits, and alternatives were carefully AGAIN and thoroughly reviewed with the patient. Ample time for discussion and questions allowed. The patient understood, was satisfied, and agreed to proceed. #2. Status post laparoscopic cholecystectomy #3. Orbit obesity 

## 2013-04-16 NOTE — Progress Notes (Signed)
Dr. Lovena Le ([facility]) paged regarding patient's foley catheter. Advised to remove foley catheter tonight.

## 2013-04-16 NOTE — Progress Notes (Signed)
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  -67/89/3810 hemoglobin A1c 7.2  -Repeat hemoglobin A1c of 7.0 -Hold oral DM meds during periop period  -Cont on novolog sliding scale - BS remains in the 200 range so increased lantus to 15units - Would resume PO diabetic meds on discharge Hyperlipidemia  -Continue Lipitor  -Lipid panel in the morning  Diabetic neuropathy  -Continue Lyrica  Symptomatic cholelithiasis  -Cholecystectomy per general surgery -for ERCP with sphincterotomy per GI service  Antibiotics:  Ciprofloxacin perioperative  HPI/Subjective: No acute events noted overnight  Objective: Filed Vitals:   04/15/13 1435 04/15/13 2100 04/16/13 0500 04/16/13 1437  BP: 150/88 164/90 133/77 134/79  Pulse: 85 96 86 81  Temp: 98.7 F (37.1 C) 98.1 F (36.7 C) 97.8 F (36.6 C) 98.4 F (36.9 C)  TempSrc: Oral Oral Oral Oral  Resp: 16 16 16 16   Height:      Weight:      SpO2: 94% 93% 96% 91%    Intake/Output Summary (Last 24 hours) at 04/16/13 1440 Last data filed at 04/16/13 1438  Gross per 24 hour  Intake   1470 ml  Output   2675 ml  Net  -1205 ml   Filed Weights   04/13/13 0555  Weight: 136.079 kg (300 lb)    Exam:   General:  Awake, in nad  Cardiovascular: regular, s1, s2  Respiratory: normal resp effort, no wheezing  Abdomen: soft, nondistended  Musculoskeletal: perfused, no clubbing   Data Reviewed: Basic Metabolic Panel:  Recent Labs Lab 04/13/13 0620 04/14/13 0524 04/15/13 0526  NA 141 136* 138  K 3.9 3.9 3.3*  CL 100 97 96  CO2 28 24 29   GLUCOSE 165* 224* 202*  BUN 22 14 8   CREATININE 0.76 0.71 0.76  CALCIUM 9.2 9.0 8.7  MG  --  1.2*  --    Liver Function  Tests:  Recent Labs Lab 04/13/13 0620 04/14/13 0524 04/15/13 0526  AST 15 1114* 294*  ALT 16 795* 518*  ALKPHOS 78 175* 152*  BILITOT 0.4 2.6* 1.0  PROT 7.0 7.0 6.5  ALBUMIN 3.7 3.5 3.2*    Recent Labs Lab 04/13/13 0620 04/14/13 0524 04/14/13 1625 04/15/13 0526  LIPASE 28 29 18   --   AMYLASE  --   --   --  43   No results found for this basename: AMMONIA,  in the last 168 hours CBC:  Recent Labs Lab 04/13/13 0620 04/14/13 0524 04/15/13 0526  WBC 7.0 3.4* 9.7  NEUTROABS 3.4  --   --   HGB 12.9 12.7 12.5  HCT 38.2 38.6 37.1  MCV 92.9 93.0 92.1  PLT 203 189 192   Cardiac Enzymes: No results found for this basename: CKTOTAL, CKMB, CKMBINDEX, TROPONINI,  in the last 168 hours BNP (last 3 results) No results found for this basename: PROBNP,  in the last 8760 hours CBG:  Recent Labs Lab 04/15/13 1117 04/15/13 1647 04/15/13 2107 04/16/13 0732 04/16/13 1146  GLUCAP 203* 153* 190* 214* 199*    Recent Results (from the past 240 hour(s))  SURGICAL PCR SCREEN     Status: None   Collection Time    04/14/13  6:24 AM      Result Value Range Status   MRSA, PCR NEGATIVE  NEGATIVE  Final   Staphylococcus aureus NEGATIVE  NEGATIVE Final   Comment:            The Xpert SA Assay (FDA     approved for NASAL specimens     in patients over 43 years of age),     is one component of     a comprehensive surveillance     program.  Test performance has     been validated by [employer] for patients greater     than or equal to 1 year old.     It is not intended     to diagnose infection nor to     guide or monitor treatment.     Studies: No results found.  Scheduled Meds: . capsicum oleoresin   Topical QID  . chlorhexidine  1 application Topical Once  . ciprofloxacin  400 mg Intravenous Q12H  . heparin  5,000 Units Subcutaneous Q8H  . hydrochlorothiazide  25 mg Oral Daily  . insulin aspart  0-20 Units Subcutaneous TID WC  . insulin aspart  0-5 Units  Subcutaneous QHS  . insulin glargine  15 Units Subcutaneous Daily  . lisinopril  40 mg Oral Daily  . metoprolol succinate  25 mg Oral Daily  . pantoprazole  40 mg Oral Daily  . pregabalin  75 mg Oral TID   Continuous Infusions: . dextrose 5 % and 0.45 % NaCl with KCl 20 mEq/L 50 mL/hr at 04/15/13 2003    Active Problems:   HYPERLIPIDEMIA   HYPERTENSION   Diabetes mellitus with neurological manifestation   Hypertension goal BP (blood pressure) < 130/80   Chronic venous insufficiency   Symptomatic cholelithiasis   Choledocholithiasis with acute cholecystitis  Time spent: 54min  Aaleah Hirsch, [facility] Pager [phone]. If 7PM-7AM, please contact night-coverage at [email or web address], password [facility] 04/16/2013, 2:40 PM  LOS: 3 days

## 2013-04-16 NOTE — Anesthesia Preprocedure Evaluation (Signed)
Anesthesia Evaluation  Patient identified by MRN, date of birth, ID band Patient awake    Reviewed: Allergy & Precautions, H&P , NPO status , Patient's Chart, lab work & pertinent test results  Airway Mallampati: II TM Distance: >3 FB Neck ROM: Full    Dental no notable dental hx.    Pulmonary neg pulmonary ROS,  breath sounds clear to auscultation  Pulmonary exam normal       Cardiovascular hypertension, Pt. on medications + Peripheral Vascular Disease Rhythm:Regular Rate:Normal     Neuro/Psych negative neurological ROS  negative psych ROS   GI/Hepatic Neg liver ROS, GERD-  Medicated,  Endo/Other  diabetes, Type 2, Oral Hypoglycemic AgentsMorbid obesity  Renal/GU negative Renal ROS  negative genitourinary   Musculoskeletal negative musculoskeletal ROS (+)   Abdominal (+) + obese,   Peds negative pediatric ROS (+)  Hematology negative hematology ROS (+)   Anesthesia Other Findings   Reproductive/Obstetrics negative OB ROS                           Anesthesia Physical Anesthesia Plan  ASA: III  Anesthesia Plan: General   Post-op Pain Management:    Induction: Intravenous  Airway Management Planned: Oral ETT  Additional Equipment:   Intra-op Plan:   Post-operative Plan: Extubation in OR  Informed Consent: I have reviewed the patients History and Physical, chart, labs and discussed the procedure including the risks, benefits and alternatives for the proposed anesthesia with the patient or authorized representative who has indicated his/her understanding and acceptance.   Dental advisory given  Plan Discussed with: CRNA  Anesthesia Plan Comments:         Anesthesia Quick Evaluation

## 2013-04-17 ENCOUNTER — Encounter (HOSPITAL_COMMUNITY): Payer: Self-pay

## 2013-04-17 ENCOUNTER — Encounter (HOSPITAL_COMMUNITY): Admission: EM | Disposition: A | Discharge status: Home / Self Care | Payer: Self-pay | Source: Home / Self Care

## 2013-04-17 ENCOUNTER — Encounter (HOSPITAL_COMMUNITY): Payer: Medicare HMO | Admitting: Anesthesiology

## 2013-04-17 ENCOUNTER — Inpatient Hospital Stay (HOSPITAL_COMMUNITY): Payer: Medicare HMO

## 2013-04-17 ENCOUNTER — Inpatient Hospital Stay (HOSPITAL_COMMUNITY): Payer: Medicare HMO | Admitting: Anesthesiology

## 2013-04-17 HISTORY — PX: ERCP: SHX5425

## 2013-04-17 LAB — GLUCOSE, CAPILLARY
GLUCOSE-CAPILLARY: 193 mg/dL — AB (ref 70–99)
GLUCOSE-CAPILLARY: 215 mg/dL — AB (ref 70–99)
GLUCOSE-CAPILLARY: 143 mg/dL — AB (ref 70–99)
GLUCOSE-CAPILLARY: 165 mg/dL — AB (ref 70–99)

## 2013-04-17 SURGERY — ERCP, WITH INTERVENTION IF INDICATED
Anesthesia: General

## 2013-04-17 MED ORDER — SUCCINYLCHOLINE CHLORIDE 20 MG/ML IJ SOLN
INTRAMUSCULAR | Status: DC | PRN
  Administered 2013-04-17: 100 mg via INTRAVENOUS

## 2013-04-17 MED ORDER — ONDANSETRON HCL 4 MG/2ML IJ SOLN
INTRAMUSCULAR | Status: AC
  Filled 2013-04-17: qty 2

## 2013-04-17 MED ORDER — LACTATED RINGERS IV SOLN
INTRAVENOUS | Status: DC
  Administered 2013-04-17: 1000 mL via INTRAVENOUS

## 2013-04-17 MED ORDER — LIDOCAINE HCL (CARDIAC) 20 MG/ML IV SOLN
INTRAVENOUS | Status: DC | PRN
  Administered 2013-04-17: 100 mg via INTRAVENOUS

## 2013-04-17 MED ORDER — PROPOFOL 10 MG/ML IV BOLUS
INTRAVENOUS | Status: AC
  Filled 2013-04-17: qty 20

## 2013-04-17 MED ORDER — GLUCAGON HCL (RDNA) 1 MG IJ SOLR
INTRAMUSCULAR | Status: AC
  Filled 2013-04-17: qty 1

## 2013-04-17 MED ORDER — ONDANSETRON HCL 4 MG/2ML IJ SOLN
INTRAMUSCULAR | Status: DC | PRN
  Administered 2013-04-17: 4 mg via INTRAVENOUS

## 2013-04-17 MED ORDER — LIDOCAINE HCL (CARDIAC) 20 MG/ML IV SOLN
INTRAVENOUS | Status: AC
  Filled 2013-04-17: qty 5

## 2013-04-17 MED ORDER — INSULIN GLARGINE 100 UNIT/ML ~~LOC~~ SOLN
8.0000 [IU] | Freq: Every day | SUBCUTANEOUS | Status: DC
  Filled 2013-04-17: qty 0.08

## 2013-04-17 MED ORDER — FENTANYL CITRATE 0.05 MG/ML IJ SOLN
INTRAMUSCULAR | Status: AC
  Filled 2013-04-17: qty 2

## 2013-04-17 MED ORDER — SODIUM CHLORIDE 0.9 % IV SOLN: INTRAVENOUS | Status: DC

## 2013-04-17 MED ORDER — SODIUM CHLORIDE 0.9 % IV SOLN
INTRAVENOUS | Status: DC | PRN
  Administered 2013-04-17: 10:00:00

## 2013-04-17 MED ORDER — LACTATED RINGERS IV SOLN
INTRAVENOUS | Status: DC
  Administered 2013-04-17: 16:00:00 via INTRAVENOUS
  Administered 2013-04-18 (×2): 125 mL/h via INTRAVENOUS
  Administered 2013-04-19: 03:00:00 via INTRAVENOUS

## 2013-04-17 MED ORDER — INSULIN GLARGINE 100 UNIT/ML ~~LOC~~ SOLN
20.0000 [IU] | Freq: Every day | SUBCUTANEOUS | Status: DC
  Administered 2013-04-18 – 2013-04-19 (×2): 20 [IU] via SUBCUTANEOUS
  Filled 2013-04-17 (×2): qty 0.2

## 2013-04-17 MED ORDER — FENTANYL CITRATE 0.05 MG/ML IJ SOLN
INTRAMUSCULAR | Status: DC | PRN
  Administered 2013-04-17: 100 ug via INTRAVENOUS

## 2013-04-17 MED ORDER — GLUCAGON HCL (RDNA) 1 MG IJ SOLR
INTRAMUSCULAR | Status: DC | PRN
  Administered 2013-04-17 (×2): .5 mg via INTRAVENOUS

## 2013-04-17 MED ORDER — PROPOFOL 10 MG/ML IV BOLUS
INTRAVENOUS | Status: DC | PRN
  Administered 2013-04-17: 25 mg via INTRAVENOUS

## 2013-04-17 NOTE — Transfer of Care (Signed)
Immediate Anesthesia Transfer of Care Note  Patient: Debra [name]  Procedure(s) Performed: Procedure(s) with comments: ENDOSCOPIC RETROGRADE CHOLANGIOPANCREATOGRAPHY (ERCP) (N/A) - note pt want general anesthesia for this case.  preferto perform in endo unit, but if unavailable please arrange time in OR  Patient Location: PACU  Anesthesia Type:General  Level of Consciousness: sedated  Airway & Oxygen Therapy: Patient Spontanous Breathing and Patient connected to face mask oxygen  Post-op Assessment: Report given to PACU RN and Post -op Vital signs reviewed and stable  Post vital signs: Reviewed and stable  Complications: No apparent anesthesia complications

## 2013-04-17 NOTE — Interval H&P Note (Signed)
History and Physical Interval Note:  04/17/2013 8:08 AM  Debra [name]  has presented today for surgery, with the diagnosis of stone in bile duct.  The various methods of treatment have been discussed with the patient and family. After consideration of risks, benefits and other options for treatment, the patient has consented to  Procedure(s) with comments: ENDOSCOPIC RETROGRADE CHOLANGIOPANCREATOGRAPHY (ERCP) (N/A) - note pt want general anesthesia for this case.  preferto perform in endo unit, but if unavailable please arrange time in OR as a surgical intervention .  The patient's history has been reviewed, patient examined, no change in status, stable for surgery.  I have reviewed the patient's chart and labs.  Questions were answered to the patient's satisfaction.     Scarlette Shorts

## 2013-04-17 NOTE — Progress Notes (Signed)
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  -07/37/1062 hemoglobin A1c 7.2  -Repeat hemoglobin A1c of 7.0 -Hold oral DM meds during periop period  -Cont on novolog sliding scale - BS remains in the 200 range so increased lantus to 20units Hyperlipidemia  -Continue Lipitor  -Lipid panel in the morning  Diabetic neuropathy  -Continue Lyrica  Symptomatic cholelithiasis  -Cholecystectomy per general surgery -s/p ERCP with sphincterotomy per GI service  Antibiotics:  Ciprofloxacin perioperative  HPI/Subjective: No significant events noted overnight  Objective: Filed Vitals:   04/17/13 1040 04/17/13 1050 04/17/13 1055 04/17/13 1123  BP: 158/83 168/67 125/64 131/66  Pulse:    82  Temp:    98.4 F (36.9 C)  TempSrc:    Oral  Resp: 13 18 18 16   Height:      Weight:      SpO2: 92% 92% 93% 91%    Intake/Output Summary (Last 24 hours) at 04/17/13 1232 Last data filed at 04/17/13 1206  Gross per 24 hour  Intake 1226.67 ml  Output   1525 ml  Net -298.33 ml   Filed Weights   04/13/13 0555  Weight: 136.079 kg (300 lb)    Exam:   General:  Awake, in nad  Cardiovascular: regular, s1, s2  Respiratory: normal resp effort, no wheezing  Abdomen: soft, nondistended  Musculoskeletal: perfused, no clubbing   Data Reviewed: Basic Metabolic Panel:  Recent Labs Lab 04/13/13 0620 04/14/13 0524 04/15/13 0526  NA 141 136* 138  K 3.9 3.9 3.3*  CL 100 97 96  CO2 28 24 29   GLUCOSE 165* 224* 202*  BUN 22 14 8   CREATININE 0.76 0.71 0.76  CALCIUM 9.2 9.0 8.7  MG  --  1.2*  --    Liver Function Tests:  Recent Labs Lab 04/13/13 0620 04/14/13 0524 04/15/13 0526  AST 15 1114* 294*  ALT 16  795* 518*  ALKPHOS 78 175* 152*  BILITOT 0.4 2.6* 1.0  PROT 7.0 7.0 6.5  ALBUMIN 3.7 3.5 3.2*    Recent Labs Lab 04/13/13 0620 04/14/13 0524 04/14/13 1625 04/15/13 0526  LIPASE 28 29 18   --   AMYLASE  --   --   --  43   No results found for this basename: AMMONIA,  in the last 168 hours CBC:  Recent Labs Lab 04/13/13 0620 04/14/13 0524 04/15/13 0526  WBC 7.0 3.4* 9.7  NEUTROABS 3.4  --   --   HGB 12.9 12.7 12.5  HCT 38.2 38.6 37.1  MCV 92.9 93.0 92.1  PLT 203 189 192   Cardiac Enzymes: No results found for this basename: CKTOTAL, CKMB, CKMBINDEX, TROPONINI,  in the last 168 hours BNP (last 3 results) No results found for this basename: PROBNP,  in the last 8760 hours CBG:  Recent Labs Lab 04/16/13 1146 04/16/13 1610 04/16/13 2247 04/17/13 0740 04/17/13 1131  GLUCAP 199* 167* 202* 193* 215*    Recent Results (from the past 240 hour(s))  SURGICAL PCR SCREEN     Status: None   Collection Time    04/14/13  6:24 AM      Result Value Range Status   MRSA, PCR NEGATIVE  NEGATIVE Final   Staphylococcus aureus NEGATIVE  NEGATIVE Final   Comment:  The Xpert SA Assay (FDA     approved for NASAL specimens     in patients over 4 years of age),     is one component of     a comprehensive surveillance     program.  Test performance has     been validated by [employer] for patients greater     than or equal to 76 year old.     It is not intended     to diagnose infection nor to     guide or monitor treatment.     Studies: Dg Ercp Biliary & Pancreatic Ducts  04/17/2013   CLINICAL DATA:  ERCP with sphincterotomy and balloon occlusion cholangiogram  EXAM: ERCP  TECHNIQUE: Multiple spot images obtained with the fluoroscopic device and submitted for interpretation post-procedure.  COMPARISON:  DG CHOLANGIOGRAM OPERATIVE dated 04/14/2013; US ABDOMEN COMPLETE dated 04/13/2013; CT ABD W/CM dated 03/11/2008  FINDINGS: Seven spot intraoperative fluoroscopic  images of the right upper quadrant during ERCP are provided for review.  Initial image demonstrates an ERCP probe overlying the right upper abdominal quadrant. Cholecystectomy clips overlie the expected location of the gallbladder fossa.  Subsequent images demonstrate selective cannulation and opacification of the common bile duct which appears mildly dilated. There is minimal opacification the central aspect of the biliary tree which appears mildly dilated centrally. There are no discrete filling defects within the opacified portions of the biliary tree.  There is insufflation of a balloon within the peripheral aspect of the proper hepatic duct. The subsequent images demonstrates the passage of contrast into the descending duodenum.  There is no definitive opacification of the residual cystic duct. No definitive opacification of the pancreatic duct.  IMPRESSION: ERCP with biliary sweeping and presumed sphincterotomy as above.  These images were submitted for radiologic interpretation only. Please see the procedural report for the amount of contrast and the fluoroscopy time utilized.   Electronically Signed   By: Sandi Mariscal M.D.   On: 04/17/2013 12:05    Scheduled Meds: . capsicum oleoresin   Topical QID  . ciprofloxacin  400 mg Intravenous Q12H  . heparin  5,000 Units Subcutaneous Q8H  . hydrochlorothiazide  25 mg Oral Daily  . insulin aspart  0-20 Units Subcutaneous TID WC  . insulin aspart  0-5 Units Subcutaneous QHS  . insulin glargine  8 Units Subcutaneous Daily  . lisinopril  40 mg Oral Daily  . metoprolol succinate  25 mg Oral Daily  . pantoprazole  40 mg Oral Daily  . pregabalin  75 mg Oral TID   Continuous Infusions: . lactated ringers      Active Problems:   HYPERLIPIDEMIA   HYPERTENSION   Diabetes mellitus with neurological manifestation   Hypertension goal BP (blood pressure) < 130/80   Chronic venous insufficiency   Symptomatic cholelithiasis   Choledocholithiasis with acute  cholecystitis  Time spent: 25min  [name], [facility] Pager [phone]. If 7PM-7AM, please contact night-coverage at [email or web address], password [facility] 04/17/2013, 12:32 PM  LOS: 4 days

## 2013-04-17 NOTE — Op Note (Signed)
[facility] [place], [address]   ERCP PROCEDURE REPORT  PATIENT: Debra [name], Debra [name].  MR# :[id number] BIRTHDATE: 1945/06/15  GENDER: Female ENDOSCOPIST: Eustace Quail, MD REFERRED BY: Fanny Skates, M.D. PROCEDURE DATE:  04/17/2013 PROCEDURE:   ERCP with sphincterotomy/papillotomy and ERCP with removal of calculus/calculi ASA CLASS:   Class III INDICATIONS:suspected or rule out bile duct stones.   filling defect on intraoperative cholangiogram.   abnormal liver function test . MEDICATIONS: General endotracheal anesthesia (GETA) TOPICAL ANESTHETIC: none  DESCRIPTION OF PROCEDURE:   After the risks benefits and alternatives of the procedure were thoroughly explained, informed consent was obtained.  The duodenoscope  endoscope was introduced through the mouth  and advanced to the second portion of the duodenum .  Endoscopic findings:The side-viewing Pentax duodenal scope was passed blindly into the esophagus.  Stomach and duodenum were normal.  Major ampulla was normal.  The minor ampulla was not sought. x-ray findings: A scout radiograph of the abdomen with the endoscope in position revealed cholecystectomy clips.  Initial attempts at cannulation were wire only.  Subsequently, small amounts of contrast used to assist in defining the anatomy.  Partial pancreatogram was unremarkable.  Subsequently, an 035 wire system was exchanged for an 025 wire system with prompt selective biliary cannulation. Therapy: With the hydrophilic guidewire in the biliary tree, a sphincterotomy was made with cutting via the ERBE system in the 12:00 orientation.  The cutting cannula was exchanged for a sequential balloon.  Several tiny stone fragments, but no significant stones were extracted.  Occlusion cholangiogram was negative with excellent drainage. The scope was then completely withdrawn from the patient and the procedure terminated.     COMPLICATIONS: .   There were no complications.  ENDOSCOPIC IMPRESSION: 1. Choledocholithiasis status post ERCP with sphincterotomy and Stone fragment extraction.  RECOMMENDATIONS: 1. Return to hospital for post-ERCP observation 2. Continue antibiotics 3. Monitor liver tests and clinical progress 4. Rectal indomethacin given     _______________________________ eSignedEustace Quail, MD 04/17/2013 10:16 AM   XB:[id number] Dalbert Batman, MD The Patient

## 2013-04-17 NOTE — Preoperative (Signed)
Beta Blockers   Reason not to administer Beta Blockers:Not Applicable 

## 2013-04-17 NOTE — Progress Notes (Signed)
Agree with above.   Successful extraction of CBD stone with ERCP by GI. Advance diet.  Hopefully home in AM.

## 2013-04-17 NOTE — Progress Notes (Signed)
Day of Surgery  Subjective: Up in chair, s/pERCP, she had a BM with voiding after procedure.  On clears now.  Feels better.    Objective: Vital signs in last 24 hours: Temp:  [97.6 F (36.4 C)-98.4 F (36.9 C)] 98.4 F (36.9 C) (01/26 1123) Pulse Rate:  [67-82] 82 (01/26 1123) Resp:  [11-18] 16 (01/26 1123) BP: (112-168)/(64-83) 131/66 mmHg (01/26 1123) SpO2:  [89 %-96 %] 91 % (01/26 1123) Last BM Date: 04/12/13 480 PO, afebrile, VSS Glucose ranging around 200  No labs today, recheck labs in Am, Diet: clears Intake/Output from previous day: 01/25 0701 - 01/26 0700 In: 1435 [P.O.:480; I.V.:555; IV Piggyback:400] Out: 1925 [Urine:1925] Intake/Output this shift: Total I/O In: 800 [I.V.:800] Out: -   General appearance: alert, cooperative, no distress and onO2, sats report to have dropped.   Resp: few rales in bases. GI: soft, very large abdomen, some BS, Port sites look fine.    Lab Results:   Recent Labs  04/15/13 0526  WBC 9.7  HGB 12.5  HCT 37.1  PLT 192    BMET  Recent Labs  04/15/13 0526  NA 138  K 3.3*  CL 96  CO2 29  GLUCOSE 202*  BUN 8  CREATININE 0.76  CALCIUM 8.7   PT/INR No results found for this basename: LABPROT, INR,  in the last 72 hours   Recent Labs Lab 04/13/13 0620 04/14/13 0524 04/15/13 0526  AST 15 1114* 294*  ALT 16 795* 518*  ALKPHOS 78 175* 152*  BILITOT 0.4 2.6* 1.0  PROT 7.0 7.0 6.5  ALBUMIN 3.7 3.5 3.2*     Lipase     Component Value Date/Time   LIPASE 18 04/14/2013 1625     Studies/Results: No results found.  Medications: . capsicum oleoresin   Topical QID  . ciprofloxacin  400 mg Intravenous Q12H  . heparin  5,000 Units Subcutaneous Q8H  . hydrochlorothiazide  25 mg Oral Daily  . insulin aspart  0-20 Units Subcutaneous TID WC  . insulin aspart  0-5 Units Subcutaneous QHS  . insulin glargine  8 Units Subcutaneous Daily  . lisinopril  40 mg Oral Daily  . metoprolol succinate  25 mg Oral Daily  .  pantoprazole  40 mg Oral Daily  . pregabalin  75 mg Oral TID   Prior to Admission medications   Medication Sig Start Date End Date Taking? Authorizing Provider  ACCU-CHEK AVIVA PLUS test strip  04/29/12  Yes Historical Provider, MD  Alogliptin-Pioglitazone (OSENI) 12.5-30 MG TABS Take 1 tablet by mouth daily.   Yes Historical Provider, MD  atorvastatin (LIPITOR) 20 MG tablet Take 1 tablet (20 mg total) by mouth daily. 11/28/12  Yes Tiffany L Reed, DO  Capsaicin-Menthol-Methyl Sal (CAPSAICIN-METHYL SAL-MENTHOL) 0.025-1-12 % CREA Apply 1 Tube topically 4 (four) times daily. 04/04/13  Yes Mahima Bubba Camp, MD  diphenhydrAMINE (BENADRYL) 25 MG tablet Take 2 tablets (50 mg total) by mouth every 6 (six) hours as needed for itching. 03/19/12  Yes John L Molpus, MD  hydrochlorothiazide (HYDRODIURIL) 25 MG tablet Take 25 mg by mouth daily.   Yes Historical Provider, MD  ibuprofen (ADVIL,MOTRIN) 200 MG tablet Take 400 mg by mouth every 6 (six) hours as needed for pain. For pain   Yes Historical Provider, MD  lisinopril (PRINIVIL,ZESTRIL) 40 MG tablet Take 40 mg by mouth daily.   Yes Historical Provider, MD  metFORMIN (GLUCOPHAGE) 1000 MG tablet TAKE 1 TABLET BY MOUTH TWICE DAILY WITH A MEAL 02/14/13  Yes  Tiffany L Reed, DO  metoprolol succinate (TOPROL-XL) 25 MG 24 hr tablet Take 25 mg by mouth daily.   Yes Historical Provider, MD  omeprazole (PRILOSEC) 20 MG capsule Take 1 capsule (20 mg total) by mouth daily. 01/19/13  Yes Pricilla Larsson, NP  pioglitazone (ACTOS) 30 MG tablet TAKE 1 TABLET BY MOUTH EVERY MORNING 02/12/13  Yes Tiffany L Reed, DO  pregabalin (LYRICA) 75 MG capsule Take 1 capsule (75 mg total) by mouth 3 (three) times daily. 04/04/13  Yes Blanchie Serve, MD    Assessment/Plan 1. Symptomatic cholelithiasis   S/pLaparoscopic lysis of adhesions, extensive, 45 minutes, Laparoscopic cholecystectomy with IOC, Adin Hector, MD01/23/2015 .  1a.  ERCP with sphincterotomy/papillotomy and ERCP with  removal of calculus/calculi, Irene Shipper, MD, 04/17/2013. 2. AODM with neuropathy  3. Hypertension  4. Dyslipidemia  5. Hx of diverticulits, colostomy/multiple abdominal surgeries/Ventral hernia  6. Morbid obesity BMI 51  7. Chronic venous insuffiencey  8. Hx of LLE DVT last year, not placed on anticoagulation  9. Deconditioning requires cane to walk.  Plan:  Advance diet, she is on her pre op meds, but she may need some extra diuresis, advance diet and aim for discharge tomorrow.  Ask PT to see and help walk her, check O2 sats while walking.     LOS: 4 days    Ryanne Morand 04/17/2013

## 2013-04-17 NOTE — Anesthesia Procedure Notes (Signed)
Procedure Name: Intubation Date/Time: 04/17/2013 8:15 AM Performed by: Danley Danker L Patient Re-evaluated:Patient Re-evaluated prior to inductionOxygen Delivery Method: Circle system utilized Preoxygenation: Pre-oxygenation with 100% oxygen Intubation Type: IV induction Ventilation: Mask ventilation without difficulty and Oral airway inserted - appropriate to patient size Laryngoscope Size: Sabra Heck and 2 Grade View: Grade I Tube type: Oral Tube size: 7.5 mm Number of attempts: 1 Airway Equipment and Method: Stylet Placement Confirmation: ETT inserted through vocal cords under direct vision,  breath sounds checked- equal and bilateral and positive ETCO2 Secured at: 21 cm Tube secured with: Tape Dental Injury: Teeth and Oropharynx as per pre-operative assessment

## 2013-04-17 NOTE — Anesthesia Postprocedure Evaluation (Signed)
  Anesthesia Post-op Note  Patient: Debra [name]  Procedure(s) Performed: Procedure(s) (LRB): ENDOSCOPIC RETROGRADE CHOLANGIOPANCREATOGRAPHY (ERCP) (N/A)  Patient Location: PACU  Anesthesia Type: General  Level of Consciousness: awake and alert   Airway and Oxygen Therapy: Patient Spontanous Breathing  Post-op Pain: mild  Post-op Assessment: Post-op Vital signs reviewed, Patient's Cardiovascular Status Stable, Respiratory Function Stable, Patent Airway and No signs of Nausea or vomiting  Last Vitals:  Filed Vitals:   04/17/13 1123  BP: 131/66  Pulse: 82  Temp: 36.9 C  Resp: 16    Post-op Vital Signs: stable   Complications: No apparent anesthesia complications

## 2013-04-17 NOTE — H&P (View-Only) (Signed)
HISTORY OF PRESENT ILLNESS:  Debra [name] is a 68 y.o. female admitted with symptomatic cholelithiasis status post cholecystectomy. Intraoperative cholangiogram revealed choledocholithiasis. Clinical stable. LFTs improved. Abdominal pain less. Hungry. Husband in room.  REVIEW OF SYSTEMS:  All non-GI ROS negative  Past Medical History  Diagnosis Date  . Diabetes mellitus with neurological manifestation   . Hypertension goal BP (blood pressure) < 130/80   . Hyperlipidemia LDL goal < 100   . Osteopenia     DEXA scan 7/07  . Chronic venous insufficiency   . Morbid obesity   . Diverticulitis     h/o 2-3 episodes in past  . Leg edema     secondary to chronic venous insufficiency  . Cellulitis of left leg     history of, most recent episode 01/09    Past Surgical History  Procedure Laterality Date  . Appendectomy    . Abdominal hysterectomy    . Colectomy      with diverting colsotmy and revision in the 1990's for diverticulitis  . Eye surgery  8/13    left cataract removal & retinal repair    Social History Debra [name]  reports that she has never smoked. She has never used smokeless tobacco. She reports that she does not drink alcohol or use illicit drugs.  family history includes Cancer in her brother and brother; Heart disease in her brother, father, mother, and sister; Hypertension in her sister.  Allergies  Allergen Reactions  . Aspirin Nausea Only    And causes bruises  . Banana Hives  . Hydrocodone Hives  . Neomycin-Polymyxin-Gramicidin Itching and Swelling    Eye drops caused swelling in face and rash and itching on arms and neck  . Penicillins Hives and Itching  . Latex Hives and Rash       PHYSICAL EXAMINATION: Vital signs: BP 134/79  Pulse 81  Temp(Src) 98.4 F (36.9 C) (Oral)  Resp 16  Ht 5\' 4"  (1.626 m)  Wt 300 lb (136.079 kg)  BMI 51.47 kg/m2  SpO2 91%  LMP 07/02/1973 General: Well-developed, well-nourished, no acute distress HEENT:  Sclerae are anicteric, conjunctiva pink. Oral mucosa intact Lungs: Clear Heart: Regular Abdomen: soft, obese, nontender, nondistended, no obvious ascites, no peritoneal signs, normal bowel sounds. No organomegaly. Extremities: No edema Psychiatric: alert and oriented x3. Cooperative     ASSESSMENT:  #1. Choledocholithiasis. Plan ERCP with sphincterotomy and stone extraction tomorrow at 8 AM with general anesthesia.The nature of the procedure, as well as the risks, benefits, and alternatives were carefully AGAIN and thoroughly reviewed with the patient. Ample time for discussion and questions allowed. The patient understood, was satisfied, and agreed to proceed. #2. Status post laparoscopic cholecystectomy #3. Orbit obesity

## 2013-04-18 ENCOUNTER — Encounter (HOSPITAL_COMMUNITY): Payer: Self-pay | Admitting: Internal Medicine

## 2013-04-18 DIAGNOSIS — K8020 Calculus of gallbladder without cholecystitis without obstruction: Secondary | ICD-10-CM

## 2013-04-18 DIAGNOSIS — E669 Obesity, unspecified: Secondary | ICD-10-CM

## 2013-04-18 LAB — COMPREHENSIVE METABOLIC PANEL
ALBUMIN: 2.6 g/dL — AB (ref 3.5–5.2)
ALK PHOS: 176 U/L — AB (ref 39–117)
ALT: 243 U/L — ABNORMAL HIGH (ref 0–35)
AST: 130 U/L — ABNORMAL HIGH (ref 0–37)
BUN: 13 mg/dL (ref 6–23)
CO2: 30 mEq/L (ref 19–32)
CREATININE: 0.81 mg/dL (ref 0.50–1.10)
Calcium: 8.9 mg/dL (ref 8.4–10.5)
Chloride: 94 mEq/L — ABNORMAL LOW (ref 96–112)
GFR calc non Af Amer: 74 mL/min — ABNORMAL LOW (ref >90)
GFR, EST AFRICAN AMERICAN: 86 mL/min — AB (ref >90)
GLUCOSE: 187 mg/dL — AB (ref 70–99)
Potassium: 3.2 mEq/L — ABNORMAL LOW (ref 3.7–5.3)
Sodium: 139 mEq/L (ref 137–147)
TOTAL PROTEIN: 6.4 g/dL (ref 6.0–8.3)
Total Bilirubin: 5.3 mg/dL — ABNORMAL HIGH (ref 0.3–1.2)

## 2013-04-18 LAB — CBC
HEMATOCRIT: 36.7 % (ref 36.0–46.0)
Hemoglobin: 12.4 g/dL (ref 12.0–15.0)
MCH: 30.8 pg (ref 26.0–34.0)
MCHC: 33.8 g/dL (ref 30.0–36.0)
MCV: 91.1 fL (ref 78.0–100.0)
Platelets: 203 10*3/uL (ref 150–400)
RBC: 4.03 MIL/uL (ref 3.87–5.11)
RDW: 13.8 % (ref 11.5–15.5)
WBC: 7.5 10*3/uL (ref 4.0–10.5)

## 2013-04-18 LAB — GLUCOSE, CAPILLARY
GLUCOSE-CAPILLARY: 261 mg/dL — AB (ref 70–99)
GLUCOSE-CAPILLARY: 196 mg/dL — AB (ref 70–99)
Glucose-Capillary: 199 mg/dL — ABNORMAL HIGH (ref 70–99)
Glucose-Capillary: 161 mg/dL — ABNORMAL HIGH (ref 70–99)

## 2013-04-18 LAB — LIPASE, BLOOD: Lipase: 497 U/L — ABNORMAL HIGH (ref 11–59)

## 2013-04-18 MED ORDER — OXYCODONE HCL 5 MG PO TABS
5.0000 mg | ORAL_TABLET | ORAL | Status: DC | PRN
  Administered 2013-04-18: 5 mg via ORAL
  Filled 2013-04-18: qty 1

## 2013-04-18 MED ORDER — MAGNESIUM SULFATE 40 MG/ML IJ SOLN
2.0000 g | Freq: Once | INTRAMUSCULAR | Status: AC
  Administered 2013-04-18: 2 g via INTRAVENOUS
  Filled 2013-04-18: qty 50

## 2013-04-18 MED ORDER — DOCUSATE SODIUM 100 MG PO CAPS
100.0000 mg | ORAL_CAPSULE | Freq: Two times a day (BID) | ORAL | Status: DC
  Administered 2013-04-18 – 2013-04-19 (×3): 100 mg via ORAL

## 2013-04-18 MED ORDER — POTASSIUM CHLORIDE CRYS ER 20 MEQ PO TBCR: 20.0000 meq | EXTENDED_RELEASE_TABLET | Freq: Once | ORAL | Status: DC

## 2013-04-18 MED ORDER — POTASSIUM CHLORIDE CRYS ER 20 MEQ PO TBCR
40.0000 meq | EXTENDED_RELEASE_TABLET | Freq: Two times a day (BID) | ORAL | Status: AC
  Administered 2013-04-18 (×2): 40 meq via ORAL
  Filled 2013-04-18 (×2): qty 2

## 2013-04-18 NOTE — Evaluation (Signed)
Physical Therapy Evaluation Patient Details Name: Debra [name] MRN: [id number] DOB: 08-Mar-1946 Today's Date: 04/18/2013 Time: 8676-7209 PT Time Calculation (min): 22 min  PT Assessment / Plan / Recommendation History of Present Illness  68 y.o. female who presented to ED yesterday with upper abdominal pain, nausea and vomiting. Abdominal u/s revealed cholelithiasis with borderline gallbladder thickening and mild biliary duct dilation. LFTs on admission were normal but they rose significantly overnight and patient was taken for a lap chole today. Procedure proved to be difficult secondary to numerous adhesions. Her cholangiogram was abnormal revealing a distal obstruction /stone. Unable to obtain any history from patient as she is very drowsy from surgery.   Clinical Impression  Pt s/p lap chole. Pt currently with functional limitations due to the deficits listed below (see PT Problem List).  Pt will benefit from skilled PT to increase their independence and safety with mobility to allow discharge to the venue listed below.  Pt very agreeable to ambulate.  Pt's oxygen saturations dropped during ambulation requiring 2L O2 [place] (see below).     PT Assessment  Patient needs continued PT services    Follow Up Recommendations  No PT follow up    Does the patient have the potential to tolerate intense rehabilitation      Barriers to Discharge        Equipment Recommendations  Rolling walker with 5" wheels    Recommendations for Other Services     Frequency Min 3X/week    Precautions / Restrictions Precautions Precaution Comments: monitor sats   Pertinent Vitals/Pain SATURATION QUALIFICATIONS: (This note is used to comply with regulatory documentation for home oxygen)  Patient Saturations on Room Air at Rest = 92%  Patient Saturations on Room Air while Ambulating = 83%  Patient Saturations on 2 Liters of oxygen while Ambulating = 91%  Please briefly explain why patient needs home  oxygen: to maintain SpO2 above 88% during mobility     Mobility  Bed Mobility General bed mobility comments: pt up in recliner on arrival Transfers Overall transfer level: Needs assistance Equipment used: Rolling walker (2 wheeled) Transfers: Sit to/from Stand Sit to Stand: Min guard General transfer comment: verbal cues for safe technique Ambulation/Gait Ambulation/Gait assistance: Min guard Ambulation Distance (Feet): 260 Feet (total) Assistive device: Rolling walker (2 wheeled) Gait Pattern/deviations: Step-through pattern;Trunk flexed Gait velocity: decr General Gait Details: verbal cues for safe use of RW, pt with drops in sats during ambulation requiring 2L O2 [place]    Exercises     PT Diagnosis: Difficulty walking  PT Problem List: Decreased strength;Decreased activity tolerance;Decreased mobility;Cardiopulmonary status limiting activity;Decreased knowledge of use of DME;Obesity PT Treatment Interventions: DME instruction;Gait training;Functional mobility training;Stair training;Patient/family education;Therapeutic activities;Therapeutic exercise     PT Goals(Current goals can be found in the care plan section) Acute Rehab PT Goals PT Goal Formulation: With patient Time For Goal Achievement: 04/25/13 Potential to Achieve Goals: Good  Visit Information  Last PT Received On: 04/18/13 Assistance Needed: +1 History of Present Illness: 68 y.o. female who presented to ED yesterday with upper abdominal pain, nausea and vomiting. Abdominal u/s revealed cholelithiasis with borderline gallbladder thickening and mild biliary duct dilation. LFTs on admission were normal but they rose significantly overnight and patient was taken for a lap chole today. Procedure proved to be difficult secondary to numerous adhesions. Her cholangiogram was abnormal revealing a distal obstruction /stone. Unable to obtain any history from patient as she is very drowsy from surgery.  Prior  [place] expects to be discharged to:: Private residence Living Arrangements: Spouse/significant other Type of Home: House Home Access: Stairs to enter Technical brewer of Steps: 3 Entrance Stairs-Rails: Right Home Layout: One level [place]: [place] - single point Prior Function Level of Independence: Independent Communication Communication: No difficulties    Cognition  Cognition Arousal/Alertness: Awake/alert Behavior During Therapy: WFL for tasks assessed/performed Overall Cognitive Status: Within Functional Limits for tasks assessed    Extremity/Trunk Assessment Lower Extremity Assessment Lower Extremity Assessment: Overall WFL for tasks assessed   Balance    End of Session PT - End of Session Equipment Utilized During Treatment: Oxygen Activity Tolerance: Patient tolerated treatment well Patient left: in chair;with call bell/phone within reach;with family/visitor present  GP     Shaun Runyon,KATHrine E 04/18/2013, 12:27 PM Carmelia Bake, PT, DPT 04/18/2013 Pager: [phone]

## 2013-04-18 NOTE — Progress Notes (Signed)
Inpatient Diabetes Program Recommendations  AACE/ADA: New Consensus Statement on Inpatient Glycemic Control (2013)  Target Ranges:  Prepandial:   less than 140 mg/dL      Peak postprandial:   less than 180 mg/dL (1-2 hours)      Critically ill patients:  140 - 180 mg/dL   Reason for Visit: Hypeglycemia  Diabetes history: Type2 Outpatient Diabetes medications: Alogliptin-Pioglitazone 12.5/30mg  QD, Actos 30 mg QD, metformin 1000 mg bid Current orders for Inpatient glycemic control: Lantus 20 units QAM and Novolog resistant tidwc and hs  Results for Debra [name], Debra [name] (MRN [id number]) as of 04/18/2013 11:35  Ref. Range 04/17/2013 07:40 04/17/2013 11:31 04/17/2013 16:50 04/17/2013 21:34 04/18/2013 07:39  Glucose-Capillary Latest Range: 70-99 mg/dL 193 (H) 215 (H) 143 (H) 165 (H) 261 (H)  Results for [name], [name] (MRN [id number]) as of 04/18/2013 11:35  Ref. Range 04/13/2013 06:20  Hemoglobin A1C Latest Range: <5.7 % 7.0 (H)   Basal insulin increased this am. HgbA1C has improved slightly from last year. Will need to f/u with PCP regarding diabetes management when discharged. Would benefit from OP Diabetes Education consult for morbid obesity and glycemic control.  Will order.  Thank you. Lorenda Peck, RD, LDN, CDE Inpatient Diabetes Coordinator [phone]

## 2013-04-18 NOTE — Progress Notes (Signed)
Patient ID: Debra [name], female   DOB: Feb 18, 1946, 68 y.o.   MRN: [id number]  1 Day Post-Op  Subjective: Pt feels good today and wanting to be discharged.  She c/o of pain only in her back when she lays in bed so she has to sit in the chair to sleep.  She c/o of not having a bowel movement since Thursday and frequent urination (every couple of hours).  Her appetite is improving and she is ambulating more.  No c/o N/V.    Objective: Vital signs in last 24 hours: Temp:  [97.6 F (36.4 C)-98.8 F (37.1 C)] 98.1 F (36.7 C) (01/27 0534) Pulse Rate:  [82-88] 87 (01/27 0534) Resp:  [11-18] 17 (01/27 0534) BP: (125-174)/(64-89) 148/89 mmHg (01/27 0534) SpO2:  [90 %-96 %] 91 % (01/27 0534) Last BM Date: 04/12/13  Intake/Output from previous day: 01/26 0701 - 01/27 0700 In: 910 [P.O.:110; I.V.:800] Out: 250 [Urine:250] Intake/Output this shift:    PE: Physical Exam:  General: pleasant, WD/WN white obese female who is sitting up in chair in NAD HEENT: head is normocephalic, atraumatic.  Sclera are noninjected.  PERRL.  Ears and nose without any masses or lesions.  Mouth is pink and moist Heart: regular, rate, and rhythm.  Normal s1,s2. No obvious murmurs, gallops, or rubs noted.  Palpable radial and pedal pulses bilaterally Lungs: CTAB, no wheezes, rhonchi, or rales noted.  Respiratory effort nonlabored.   Abd: soft, NT/ND, +BS, port sites healing well, no masses, hernias, or organomegaly MS: all 4 extremities are symmetrical with no cyanosis, clubbing, or edema. Skin: warm and dry with no masses, lesions, or rashes Psych: A&Ox3 with an appropriate affect.  Lab Results:   Recent Labs  04/18/13 0531  WBC 7.5  HGB 12.4  HCT 36.7  PLT 203   BMET  Recent Labs  04/18/13 0531  NA 139  K 3.2*  CL 94*  CO2 30  GLUCOSE 187*  BUN 13  CREATININE 0.81  CALCIUM 8.9   PT/INR No results found for this basename: LABPROT, INR,  in the last 72 hours CMP     Component Value  Date/Time   NA 139 04/18/2013 0531   NA 144 01/19/2013 1422   K 3.2* 04/18/2013 0531   CL 94* 04/18/2013 0531   CO2 30 04/18/2013 0531   GLUCOSE 187* 04/18/2013 0531   GLUCOSE 181* 01/19/2013 1422   BUN 13 04/18/2013 0531   BUN 21 01/19/2013 1422   CREATININE 0.81 04/18/2013 0531   CREATININE 0.79 02/11/2012 1032   CALCIUM 8.9 04/18/2013 0531   PROT 6.4 04/18/2013 0531   PROT 6.5 01/19/2013 1422   ALBUMIN 2.6* 04/18/2013 0531   AST 130* 04/18/2013 0531   ALT 243* 04/18/2013 0531   ALKPHOS 176* 04/18/2013 0531   BILITOT 5.3* 04/18/2013 0531   GFRNONAA 74* 04/18/2013 0531   GFRAA 86* 04/18/2013 0531   Lipase     Component Value Date/Time   LIPASE 497* 04/18/2013 0531       Studies/Results: Dg Ercp Biliary & Pancreatic Ducts  04/17/2013   CLINICAL DATA:  ERCP with sphincterotomy and balloon occlusion cholangiogram  EXAM: ERCP  TECHNIQUE: Multiple spot images obtained with the fluoroscopic device and submitted for interpretation post-procedure.  COMPARISON:  DG CHOLANGIOGRAM OPERATIVE dated 04/14/2013; US ABDOMEN COMPLETE dated 04/13/2013; CT ABD W/CM dated 03/11/2008  FINDINGS: Seven spot intraoperative fluoroscopic images of the right upper quadrant during ERCP are provided for review.  Initial image demonstrates an ERCP probe overlying  the right upper abdominal quadrant. Cholecystectomy clips overlie the expected location of the gallbladder fossa.  Subsequent images demonstrate selective cannulation and opacification of the common bile duct which appears mildly dilated. There is minimal opacification the central aspect of the biliary tree which appears mildly dilated centrally. There are no discrete filling defects within the opacified portions of the biliary tree.  There is insufflation of a balloon within the peripheral aspect of the proper hepatic duct. The subsequent images demonstrates the passage of contrast into the descending duodenum.  There is no definitive opacification of the residual  cystic duct. No definitive opacification of the pancreatic duct.  IMPRESSION: ERCP with biliary sweeping and presumed sphincterotomy as above.  These images were submitted for radiologic interpretation only. Please see the procedural report for the amount of contrast and the fluoroscopy time utilized.   Electronically Signed   By: Sandi Mariscal M.D.   On: 04/17/2013 12:05    Anti-infectives: Anti-infectives   Start     Dose/Rate Route Frequency Ordered Stop   04/13/13 1200  ciprofloxacin (CIPRO) IVPB 400 mg     400 mg 200 mL/hr over 60 Minutes Intravenous Every 12 hours 04/13/13 1054         Assessment/Plan 1. S/p lap chole 2. Symptomatic Cholelithiasis:  S/p ERCP with sphincterotomy and removal of calculi 3. Extensive adhesions lower and mid abdomen: S/p laparoscopic lysis of adhesions 4. Elevated Lipase 497/hyperbilirubinemia 5.3 5. Hypokalemia 5. Hypertension 6. DM type 2 7. Hyperlipidemia 8. Diabetic nueropathy 9. Chronic venous insuffiencey   Plan: some evidence of post-ERCP pancreatis, keep pt on  regular diet and monitor for n/v, recheck lipase in the AM. Start pt on Colace due to no BM since 1/21. Adding potassium chloride 20 mEq for hypokalemia.  Internal medicine will continue to manage co-morbidites.   LOS: 5 days   Janifer Adie PA-S [facility]  04/18/2013, 7:51 [facility] [phone]  ----------------------------------------------------------------------------------------------------------- General Surgery PA Preceptor Note:  I agree with the above PA students findings as above.  Recheck labs tomorrow.  Hopefully d/c if labs trending down  Coralie Keens, [facility] Pager: [phone] Office: [phone]

## 2013-04-18 NOTE — Progress Notes (Signed)
HISTORY OF PRESENT ILLNESS:  Debra [name] is a 68 y.o. female who underwent ERCP with sphincterotomy yesterday. No abdominal complaints today. Abdominal exam benign. Laboratories reviewed. Mild elevation of lipase not uncommon post ERCP without clinical pancreatitis. LFTs noted.  REVIEW OF SYSTEMS:  All non-GI ROS negative .  Past Medical History  Diagnosis Date  . Diabetes mellitus with neurological manifestation   . Hypertension goal BP (blood pressure) < 130/80   . Hyperlipidemia LDL goal < 100   . Osteopenia     DEXA scan 7/07  . Chronic venous insufficiency   . Morbid obesity   . Diverticulitis     h/o 2-3 episodes in past  . Leg edema     secondary to chronic venous insufficiency  . Cellulitis of left leg     history of, most recent episode 01/09    Past Surgical History  Procedure Laterality Date  . Appendectomy    . Abdominal hysterectomy    . Colectomy      with diverting colsotmy and revision in the 1990's for diverticulitis  . Eye surgery  8/13    left cataract removal & retinal repair  . Cholecystectomy    . Cholecystectomy N/A 04/14/2013    Procedure: LAPAROSCOPIC CHOLECYSTECTOMY WITH INTRAOPERATIVE CHOLANGIOGRAM;  Surgeon: Adin Hector, MD;  Location: [facility];  Service: General;  Laterality: N/A;  . Laparoscopic lysis of adhesions N/A 04/14/2013    Procedure: LAPAROSCOPIC LYSIS OF ADHESIONS;  Surgeon: Adin Hector, MD;  Location: [facility];  Service: General;  Laterality: N/A;  . Ercp N/A 04/17/2013    Procedure: ENDOSCOPIC RETROGRADE CHOLANGIOPANCREATOGRAPHY (ERCP);  Surgeon: Irene Shipper, MD;  Location: [facility];  Service: Endoscopy;  Laterality: N/A;  note pt want general anesthesia for this case.  preferto perform in endo unit, but if unavailable please arrange time in [place]  reports that she has never smoked. She has never used smokeless tobacco. She reports that she does not drink alcohol or use illicit  drugs.  family history includes Cancer in her brother and brother; Heart disease in her brother, father, mother, and sister; Hypertension in her sister.  Allergies  Allergen Reactions  . Aspirin Nausea Only    And causes bruises  . Banana Hives  . Hydrocodone Hives  . Neomycin-Polymyxin-Gramicidin Itching and Swelling    Eye drops caused swelling in face and rash and itching on arms and neck  . Penicillins Hives and Itching  . Latex Hives and Rash       PHYSICAL EXAMINATION: Vital signs: BP 150/80  Pulse 80  Temp(Src) 98.1 F (36.7 C) (Oral)  Resp 16  Ht 5\' 4"  (1.626 m)  Wt 300 lb (136.079 kg)  BMI 51.47 kg/m2  SpO2 94%  LMP 07/02/1973 General: Well-developed, obese, well-nourished, no acute distress HEENT: Sclerae are anicteric, conjunctiva pink. Oral mucosa intact Lungs: Clear Heart: Regular Abdomen: soft, obese, nontender, nondistended, no obvious ascites, no peritoneal signs, normal bowel sounds. No organomegaly. Extremities: 1+ edema Psychiatric: alert and oriented x3. Cooperative     ASSESSMENT:  #1. Status post ERCP with sphincterotomy and extraction of trivial stone fragments only. Currently doing well. Benign abdominal exam. No clinical evidence of pancreatitis #2. Status post laparoscopic cholecystectomy   PLAN:  #1. Patient tolerating diet. Okay for discharge home from GI standpoint. She can have her liver tests followed up as an outpatient with her primary care physician or surgeon. Will sign off. Thank you

## 2013-04-18 NOTE — Progress Notes (Signed)
[facility] PROGRESS NOTE  Debra [name] UMP:[id number] DOB: 1945-05-19 DOA: 04/13/2013 PCP: Delia Chimes, NP  Assessment/Plan: hypertension  -Continue on HCTZ, lisinopril and metoprolol succinate  -BP currently stable and controlled. 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  -40/10/6759 hemoglobin A1c 7.2  -Repeat hemoglobin A1c of 7.0 -Hold oral DM meds during periop period and while inpatient -Cont on novolog sliding scale - BS near the 200 range so increased lantus to 20units - Cont to titrate insulin as needed - OK to resume oral diabetic meds on discharge Hyperlipidemia  -Continue Lipitor  Diabetic neuropathy  -Continue Lyrica  Symptomatic cholelithiasis  -Cholecystectomy per general surgery -s/p ERCP with sphincterotomy per GI service ERCP pancreatitis - Per surgery - Repeat lipase in AM Hypokalemia - Will first replace Mg before replacing K - Repeat lytes in the AM  Antibiotics:  Ciprofloxacin perioperative  HPI/Subjective: No significant events noted overnight. No further abd pain post ERCP  Objective: Filed Vitals:   04/18/13 0106 04/18/13 0534 04/18/13 0856 04/18/13 0857  BP: 174/85 148/89 126/62 126/62  Pulse: 84 87 88   Temp: 98.1 F (36.7 C) 98.1 F (36.7 C)    TempSrc: Oral Oral    Resp: 17 17    Height:      Weight:      SpO2: 90% 91%      Intake/Output Summary (Last 24 hours) at 04/18/13 1211 Last data filed at 04/18/13 0855  Gross per 24 hour  Intake 2481.25 ml  Output    250 ml  Net 2231.25 ml   Filed Weights   04/13/13 0555  Weight: 136.079 kg (300 lb)    Exam:   General:  Awake, in nad  Cardiovascular: regular, s1, s2  Respiratory: normal resp effort, no wheezing  Abdomen: soft, nondistended  Musculoskeletal: perfused, no clubbing   Data Reviewed: Basic Metabolic Panel:  Recent Labs Lab 04/13/13 0620 04/14/13 0524  04/15/13 0526 04/18/13 0531  NA 141 136* 138 139  K 3.9 3.9 3.3* 3.2*  CL 100 97 96 94*  CO2 28 24 29 30   GLUCOSE 165* 224* 202* 187*  BUN 22 14 8 13   CREATININE 0.76 0.71 0.76 0.81  CALCIUM 9.2 9.0 8.7 8.9  MG  --  1.2*  --   --    Liver Function Tests:  Recent Labs Lab 04/13/13 0620 04/14/13 0524 04/15/13 0526 04/18/13 0531  AST 15 1114* 294* 130*  ALT 16 795* 518* 243*  ALKPHOS 78 175* 152* 176*  BILITOT 0.4 2.6* 1.0 5.3*  PROT 7.0 7.0 6.5 6.4  ALBUMIN 3.7 3.5 3.2* 2.6*    Recent Labs Lab 04/13/13 0620 04/14/13 0524 04/14/13 1625 04/15/13 0526 04/18/13 0531  LIPASE 28 29 18   --  497*  AMYLASE  --   --   --  43  --    No results found for this basename: AMMONIA,  in the last 168 hours CBC:  Recent Labs Lab 04/13/13 0620 04/14/13 0524 04/15/13 0526 04/18/13 0531  WBC 7.0 3.4* 9.7 7.5  NEUTROABS 3.4  --   --   --   HGB 12.9 12.7 12.5 12.4  HCT 38.2 38.6 37.1 36.7  MCV 92.9 93.0 92.1 91.1  PLT 203 189 192 203   Cardiac Enzymes: No results found for this basename: CKTOTAL, CKMB, CKMBINDEX, TROPONINI,  in the last 168 hours BNP (last 3 results) No results found for this basename: PROBNP,  in  the last 8760 hours CBG:  Recent Labs Lab 04/17/13 1131 04/17/13 1650 04/17/13 2134 04/18/13 0739 04/18/13 1156  GLUCAP 215* 143* 165* 261* 196*    Recent Results (from the past 240 hour(s))  SURGICAL PCR SCREEN     Status: None   Collection Time    04/14/13  6:24 AM      Result Value Range Status   MRSA, PCR NEGATIVE  NEGATIVE Final   Staphylococcus aureus NEGATIVE  NEGATIVE Final   Comment:            The Xpert SA Assay (FDA     approved for NASAL specimens     in patients over 17 years of age),     is one component of     a comprehensive surveillance     program.  Test performance has     been validated by [employer] for patients greater     than or equal to 63 year old.     It is not intended     to diagnose infection nor to     guide  or monitor treatment.     Studies: Dg Ercp Biliary & Pancreatic Ducts  04/17/2013   CLINICAL DATA:  ERCP with sphincterotomy and balloon occlusion cholangiogram  EXAM: ERCP  TECHNIQUE: Multiple spot images obtained with the fluoroscopic device and submitted for interpretation post-procedure.  COMPARISON:  DG CHOLANGIOGRAM OPERATIVE dated 04/14/2013; US ABDOMEN COMPLETE dated 04/13/2013; CT ABD W/CM dated 03/11/2008  FINDINGS: Seven spot intraoperative fluoroscopic images of the right upper quadrant during ERCP are provided for review.  Initial image demonstrates an ERCP probe overlying the right upper abdominal quadrant. Cholecystectomy clips overlie the expected location of the gallbladder fossa.  Subsequent images demonstrate selective cannulation and opacification of the common bile duct which appears mildly dilated. There is minimal opacification the central aspect of the biliary tree which appears mildly dilated centrally. There are no discrete filling defects within the opacified portions of the biliary tree.  There is insufflation of a balloon within the peripheral aspect of the proper hepatic duct. The subsequent images demonstrates the passage of contrast into the descending duodenum.  There is no definitive opacification of the residual cystic duct. No definitive opacification of the pancreatic duct.  IMPRESSION: ERCP with biliary sweeping and presumed sphincterotomy as above.  These images were submitted for radiologic interpretation only. Please see the procedural report for the amount of contrast and the fluoroscopy time utilized.   Electronically Signed   By: Sandi Mariscal M.D.   On: 04/17/2013 12:05    Scheduled Meds: . capsicum oleoresin   Topical QID  . ciprofloxacin  400 mg Intravenous Q12H  . docusate sodium  100 mg Oral BID  . heparin  5,000 Units Subcutaneous Q8H  . hydrochlorothiazide  25 mg Oral Daily  . insulin aspart  0-20 Units Subcutaneous TID WC  . insulin aspart  0-5 Units  Subcutaneous QHS  . insulin glargine  20 Units Subcutaneous Daily  . lisinopril  40 mg Oral Daily  . metoprolol succinate  25 mg Oral Daily  . pantoprazole  40 mg Oral Daily  . potassium chloride  20 mEq Oral Once  . potassium chloride  40 mEq Oral BID  . pregabalin  75 mg Oral TID   Continuous Infusions: . lactated ringers 125 mL/hr (04/18/13 0855)    Active Problems:   HYPERLIPIDEMIA   HYPERTENSION   Diabetes mellitus with neurological manifestation   Hypertension  goal BP (blood pressure) < 130/80   Chronic venous insufficiency   Symptomatic cholelithiasis   Choledocholithiasis with acute cholecystitis  Time spent: 37min  [facility], [place] [facility] Pager [phone]. If 7PM-7AM, please contact night-coverage at [email or web address], password [facility] 04/18/2013, 12:11 PM  LOS: 5 days

## 2013-04-19 ENCOUNTER — Other Ambulatory Visit (INDEPENDENT_AMBULATORY_CARE_PROVIDER_SITE_OTHER): Payer: Self-pay | Admitting: *Deleted

## 2013-04-19 ENCOUNTER — Other Ambulatory Visit (INDEPENDENT_AMBULATORY_CARE_PROVIDER_SITE_OTHER): Payer: Self-pay

## 2013-04-19 DIAGNOSIS — Z9049 Acquired absence of other specified parts of digestive tract: Secondary | ICD-10-CM

## 2013-04-19 DIAGNOSIS — E1149 Type 2 diabetes mellitus with other diabetic neurological complication: Secondary | ICD-10-CM

## 2013-04-19 DIAGNOSIS — E1142 Type 2 diabetes mellitus with diabetic polyneuropathy: Secondary | ICD-10-CM

## 2013-04-19 LAB — COMPREHENSIVE METABOLIC PANEL
ALT: 244 U/L — AB (ref 0–35)
AST: 164 U/L — ABNORMAL HIGH (ref 0–37)
Albumin: 2.5 g/dL — ABNORMAL LOW (ref 3.5–5.2)
Alkaline Phosphatase: 194 U/L — ABNORMAL HIGH (ref 39–117)
BUN: 11 mg/dL (ref 6–23)
CALCIUM: 8.7 mg/dL (ref 8.4–10.5)
CO2: 30 mEq/L (ref 19–32)
Chloride: 93 mEq/L — ABNORMAL LOW (ref 96–112)
Creatinine, Ser: 0.72 mg/dL (ref 0.50–1.10)
GFR calc Af Amer: >90 mL/min (ref >90)
GFR, EST NON AFRICAN AMERICAN: 88 mL/min — AB (ref >90)
GLUCOSE: 190 mg/dL — AB (ref 70–99)
Potassium: 3.7 mEq/L (ref 3.7–5.3)
SODIUM: 135 meq/L — AB (ref 137–147)
Total Bilirubin: 4.4 mg/dL — ABNORMAL HIGH (ref 0.3–1.2)
Total Protein: 6.1 g/dL (ref 6.0–8.3)

## 2013-04-19 LAB — MAGNESIUM: Magnesium: 1.5 mg/dL (ref 1.5–2.5)

## 2013-04-19 LAB — GLUCOSE, CAPILLARY: Glucose-Capillary: 168 mg/dL — ABNORMAL HIGH (ref 70–99)

## 2013-04-19 LAB — LIPASE, BLOOD: Lipase: 122 U/L — ABNORMAL HIGH (ref 11–59)

## 2013-04-19 MED ORDER — DSS 100 MG PO CAPS: 100.0000 mg | ORAL_CAPSULE | Freq: Two times a day (BID) | ORAL | Status: DC | PRN

## 2013-04-19 MED ORDER — ACETAMINOPHEN-CODEINE #3 300-30 MG PO TABS: 1.0000 | ORAL_TABLET | ORAL | Status: DC | PRN

## 2013-04-19 NOTE — Progress Notes (Addendum)
Patient ID: Debra [name], female   DOB: 1945/12/14, 68 y.o.   MRN: [id number]  2 Days Post-Op  Subjective: Pt feels great today. Expresses her desire to go home. No c/o of abdominal pain except at the left most port incision upon palpation.  Had nausea this morning which she attributes to being hungry.  No N/V with food. Still c/o no BMs yet but passing a lot of flatus.  No c/o back pain this morning.  Urinating well.  Appetite is increasing.  She is ambulating well with PT.  Objective: Vital signs in last 24 hours: Temp:  [98.1 F (36.7 C)-99 F (37.2 C)] 98.1 F (36.7 C) (01/28 0558) Pulse Rate:  [79-88] 79 (01/28 0558) Resp:  [16-17] 17 (01/28 0558) BP: (126-164)/(62-88) 164/81 mmHg (01/28 0558) SpO2:  [91 %-94 %] 91 % (01/28 0558) Last BM Date: 04/12/13  Intake/Output from previous day: 01/27 0701 - 01/28 0700 In: 5419.6 [P.O.:480; I.V.:4739.6; IV Piggyback:200] Out: -  Intake/Output this shift:    Physical Exam:  General: pleasant, WD/WN white obese female who is sitting up in chair in NAD  HEENT: head is normocephalic, atraumatic. Sclera are noninjected. PERRL. Ears and nose without any masses or lesions. Mouth is pink and moist  Heart: regular, rate, and rhythm. Normal s1,s2. No obvious murmurs, gallops, or rubs noted. Palpable radial and pedal pulses bilaterally  Lungs: CTAB, no wheezes, rhonchi, or rales noted. Respiratory effort nonlabored.  IS @500 . Abd: soft, NT/ND, +BS, port sites healing well, tender left most port incision to palpation, ecchymosis LLQ and left thigh from heparin shots, no masses, hernias, or organomegaly  MS: all 4 extremities are symmetrical with no cyanosis, clubbing, or edema.  Skin: warm and dry with no masses, lesions, or rashes  Psych: A&Ox3 with an appropriate affect.   Lab Results:   Recent Labs  04/18/13 0531  WBC 7.5  HGB 12.4  HCT 36.7  PLT 203   BMET  Recent Labs  04/18/13 0531 04/19/13 0442  NA 139 135*  K 3.2* 3.7  CL  94* 93*  CO2 30 30  GLUCOSE 187* 190*  BUN 13 11  CREATININE 0.81 0.72  CALCIUM 8.9 8.7   PT/INR No results found for this basename: LABPROT, INR,  in the last 72 hours CMP     Component Value Date/Time   NA 135* 04/19/2013 0442   NA 144 01/19/2013 1422   K 3.7 04/19/2013 0442   CL 93* 04/19/2013 0442   CO2 30 04/19/2013 0442   GLUCOSE 190* 04/19/2013 0442   GLUCOSE 181* 01/19/2013 1422   BUN 11 04/19/2013 0442   BUN 21 01/19/2013 1422   CREATININE 0.72 04/19/2013 0442   CREATININE 0.79 02/11/2012 1032   CALCIUM 8.7 04/19/2013 0442   PROT 6.1 04/19/2013 0442   PROT 6.5 01/19/2013 1422   ALBUMIN 2.5* 04/19/2013 0442   AST 164* 04/19/2013 0442   ALT 244* 04/19/2013 0442   ALKPHOS 194* 04/19/2013 0442   BILITOT 4.4* 04/19/2013 0442   GFRNONAA 88* 04/19/2013 0442   GFRAA >90 04/19/2013 0442   Lipase     Component Value Date/Time   LIPASE 122* 04/19/2013 0442       Studies/Results: Dg Ercp Biliary & Pancreatic Ducts  04/17/2013   CLINICAL DATA:  ERCP with sphincterotomy and balloon occlusion cholangiogram  EXAM: ERCP  TECHNIQUE: Multiple spot images obtained with the fluoroscopic device and submitted for interpretation post-procedure.  COMPARISON:  DG CHOLANGIOGRAM OPERATIVE dated 04/14/2013; US ABDOMEN COMPLETE dated  04/13/2013; CT ABD W/CM dated 03/11/2008  FINDINGS: Seven spot intraoperative fluoroscopic images of the right upper quadrant during ERCP are provided for review.  Initial image demonstrates an ERCP probe overlying the right upper abdominal quadrant. Cholecystectomy clips overlie the expected location of the gallbladder fossa.  Subsequent images demonstrate selective cannulation and opacification of the common bile duct which appears mildly dilated. There is minimal opacification the central aspect of the biliary tree which appears mildly dilated centrally. There are no discrete filling defects within the opacified portions of the biliary tree.  There is insufflation of a balloon  within the peripheral aspect of the proper hepatic duct. The subsequent images demonstrates the passage of contrast into the descending duodenum.  There is no definitive opacification of the residual cystic duct. No definitive opacification of the pancreatic duct.  IMPRESSION: ERCP with biliary sweeping and presumed sphincterotomy as above.  These images were submitted for radiologic interpretation only. Please see the procedural report for the amount of contrast and the fluoroscopy time utilized.   Electronically Signed   By: Sandi Mariscal M.D.   On: 04/17/2013 12:05    Anti-infectives: Anti-infectives   Start     Dose/Rate Route Frequency Ordered Stop   04/13/13 1200  ciprofloxacin (CIPRO) IVPB 400 mg     400 mg 200 mL/hr over 60 Minutes Intravenous Every 12 hours 04/13/13 1054         Assessment/Plan 1. POD #3 S/p lap chole  2. Symptomatic Cholelithiasis: POD #2 S/p ERCP with sphincterotomy and removal of calculi  3. Extensive adhesions lower and mid abdomen: POD #3 S/p laparoscopic lysis of adhesions  4. Elevated Lipase 122/hyperbilirubinemia 4.4  - improved 5.. Hypertension  6. DM type 2  7. Hyperlipidemia  8. Diabetic nueropathy  9. Chronic venous insuffiencey   Plan: 1. Discharge patient home as she's tolerating diet well. 2. Follow up in outpatient clinic in 1 week to recheck labs (lipase, LFTs) 3. She will need to follow up with her primary care provider to manage co-morbidities  4.  Post op check in 3 weeks.    LOS: 6 days    Janifer Adie PA-S [facility]   04/19/2013, 7:51 AM [facility] [phone]  ----------------------------------------------------------------------------------------------------------- General Surgery PA Preceptor Note:  I agree with the above PA students findings as above.  Home and f/u with repeat labs at [facility] lab in 1 week.  Coralie Keens, PA-C General Surgery [facility] Pager: [phone] Office:  [phone]

## 2013-04-19 NOTE — Progress Notes (Signed)
Agree with above.  Recheck LFTs next week.

## 2013-04-19 NOTE — Progress Notes (Addendum)
[facility] PROGRESS NOTE  KALAN RINN VOZ:[id number] DOB: [birth year] DOA: 04/13/2013 PCP: Hollace Kinnier, DO  Brief narrative 68 Y/O female with HTN, DM admitted for choledocholithiasis and cholecystitis s/p ERCP with sphincterotomy and removal of calculi . S/p laparoscopic lysis of adhesions. hospitalists consulted for medical management     Assessment/ plan  hypertension  -Continue on HCTZ, lisinopril and metoprolol succinate  -BP currently stable and controlled. 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 Resume home meds on discharge  Hyperlipidemia  -Continue Lipitor   Diabetic neuropathy  -Continue Lyrica   Symptomatic cholelithiasis  -Cholecystectomy per general surgery  -s/p ERCP with sphincterotomy and remova of  stone  ERCP pancreatitis  -management  Per surgery . S/p laparoscopy and LOS -lipase improving  Hypokalemia  Replenished  Antibiotics:  Ciprofloxacin perioperative  HPI/Subjective:  Mild pain in abdomen. Stable voernight   Objective: Filed Vitals:   04/19/13 0558  BP: 164/81  Pulse: 79  Temp: 98.1 F (36.7 C)  Resp: 17    Intake/Output Summary (Last 24 hours) at 04/19/13 0949 Last data filed at 04/19/13 0547  Gross per 24 hour  Intake 3288.33 ml  Output      0 ml  Net 3288.33 ml   Filed Weights   04/13/13 0555  Weight: 136.079 kg (300 lb)    Exam:   General:  Obese female in NAD   HEENT: no pallor, moist mucosa   chest: clear b/l   CVS: NS1&S2, no murmurs  Abd: soft, mild tenderness over laparoscopic site . Clean, BS+  Ext: warm, no edema    Data Reviewed: Basic Metabolic Panel:  Recent Labs Lab 04/13/13 0620 04/14/13 0524 04/15/13 0526 04/18/13 0531 04/19/13 0442  NA 141 136* 138 139 135*  K 3.9 3.9 3.3* 3.2* 3.7  CL 100 97 96 94* 93*  CO2 28 24 29 30 30    GLUCOSE 165* 224* 202* 187* 190*  BUN 22 14 8 13 11   CREATININE 0.76 0.71 0.76 0.81 0.72  CALCIUM 9.2 9.0 8.7 8.9 8.7  MG  --  1.2*  --   --  1.5   Liver Function Tests:  Recent Labs Lab 04/13/13 0620 04/14/13 0524 04/15/13 0526 04/18/13 0531 04/19/13 0442  AST 15 1114* 294* 130* 164*  ALT 16 795* 518* 243* 244*  ALKPHOS 78 175* 152* 176* 194*  BILITOT 0.4 2.6* 1.0 5.3* 4.4*  PROT 7.0 7.0 6.5 6.4 6.1  ALBUMIN 3.7 3.5 3.2* 2.6* 2.5*    Recent Labs Lab 04/13/13 0620 04/14/13 0524 04/14/13 1625 04/15/13 0526 04/18/13 0531 04/19/13 0442  LIPASE 28 29 18   --  497* 122*  AMYLASE  --   --   --  43  --   --    No results found for this basename: AMMONIA,  in the last 168 hours CBC:  Recent Labs Lab 04/13/13 0620 04/14/13 0524 04/15/13 0526 04/18/13 0531  WBC 7.0 3.4* 9.7 7.5  NEUTROABS 3.4  --   --   --   HGB 12.9 12.7 12.5 12.4  HCT 38.2 38.6 37.1 36.7  MCV 92.9 93.0 92.1 91.1  PLT 203 189 192 203   Cardiac Enzymes: No results found for this basename: CKTOTAL, CKMB, CKMBINDEX, TROPONINI,  in the last 168 hours BNP (last 3 results) No results found for this basename: PROBNP,  in the last 8760  hours CBG:  Recent Labs Lab 04/18/13 0739 04/18/13 1156 04/18/13 1658 04/18/13 2120 04/19/13 0723  GLUCAP 261* 196* 199* 161* 168*    Recent Results (from the past 240 hour(s))  SURGICAL PCR SCREEN     Status: None   Collection Time    04/14/13  6:24 AM      Result Value Range Status   MRSA, PCR NEGATIVE  NEGATIVE Final   Staphylococcus aureus NEGATIVE  NEGATIVE Final   Comment:            The Xpert SA Assay (FDA     approved for NASAL specimens     in patients over 56 years of age),     is one component of     a comprehensive surveillance     program.  Test performance has     been validated by [employer] for patients greater     than or equal to 65 year old.     It is not intended     to diagnose infection nor to     guide or monitor  treatment.     Studies: Dg Ercp Biliary & Pancreatic Ducts  04/17/2013   CLINICAL DATA:  ERCP with sphincterotomy and balloon occlusion cholangiogram  EXAM: ERCP  TECHNIQUE: Multiple spot images obtained with the fluoroscopic device and submitted for interpretation post-procedure.  COMPARISON:  DG CHOLANGIOGRAM OPERATIVE dated 04/14/2013; US ABDOMEN COMPLETE dated 04/13/2013; CT ABD W/CM dated 03/11/2008  FINDINGS: Seven spot intraoperative fluoroscopic images of the right upper quadrant during ERCP are provided for review.  Initial image demonstrates an ERCP probe overlying the right upper abdominal quadrant. Cholecystectomy clips overlie the expected location of the gallbladder fossa.  Subsequent images demonstrate selective cannulation and opacification of the common bile duct which appears mildly dilated. There is minimal opacification the central aspect of the biliary tree which appears mildly dilated centrally. There are no discrete filling defects within the opacified portions of the biliary tree.  There is insufflation of a balloon within the peripheral aspect of the proper hepatic duct. The subsequent images demonstrates the passage of contrast into the descending duodenum.  There is no definitive opacification of the residual cystic duct. No definitive opacification of the pancreatic duct.  IMPRESSION: ERCP with biliary sweeping and presumed sphincterotomy as above.  These images were submitted for radiologic interpretation only. Please see the procedural report for the amount of contrast and the fluoroscopy time utilized.   Electronically Signed   By: Sandi Mariscal M.D.   On: 04/17/2013 12:05    Scheduled Meds: . capsicum oleoresin   Topical QID  . ciprofloxacin  400 mg Intravenous Q12H  . docusate sodium  100 mg Oral BID  . heparin  5,000 Units Subcutaneous Q8H  . hydrochlorothiazide  25 mg Oral Daily  . insulin aspart  0-20 Units Subcutaneous TID WC  . insulin aspart  0-5 Units Subcutaneous  QHS  . insulin glargine  20 Units Subcutaneous Daily  . lisinopril  40 mg Oral Daily  . metoprolol succinate  25 mg Oral Daily  . pantoprazole  40 mg Oral Daily  . potassium chloride  20 mEq Oral Once  . pregabalin  75 mg Oral TID   Continuous Infusions: . lactated ringers 125 mL/hr at 04/19/13 0315       Time spent: 25 minutes    Manson [place]oo  [facility] Pager [phone]. If 7PM-7AM, please contact night-coverage at [email or web address], password [facility] 04/19/2013, 9:49 AM  LOS: 6 days      Patient planned for d/c home today. Will sign off. Thank you for allowing [place] to participate in patient's care

## 2013-04-19 NOTE — Discharge Instructions (Signed)
Laparoscopic Cholecystectomy °Laparoscopic cholecystectomy is surgery to remove the gallbladder. The gallbladder is located in the upper right part of the abdomen, behind the liver. It is a storage sac for bile produced in the liver. Bile aids in the digestion and absorption of fats. Cholecystectomy is often done for inflammation of the gallbladder (cholecystitis). This condition is usually caused by a buildup of gallstones (cholelithiasis) in your gallbladder. Gallstones can block the flow of bile, resulting in inflammation and pain. In severe cases, emergency surgery may be required. When emergency surgery is not required, you will have time to prepare for the procedure. °Laparoscopic surgery is an alternative to open surgery. Laparoscopic surgery has a shorter recovery time. Your common bile duct may also need to be examined during the procedure. If stones are found in the common bile duct, they may be removed. °LET YOUR HEALTH CARE PROVIDER KNOW ABOUT: °· Any allergies you have. °· All medicines you are taking, including vitamins, herbs, eye drops, creams, and over-the-counter medicines. °· Previous problems you or members of your family have had with the use of anesthetics. °· Any blood disorders you have. °· Previous surgeries you have had. °· Medical conditions you have. °RISKS AND COMPLICATIONS °Generally, this is a safe procedure. However, as with any procedure, complications can occur. Possible complications include: °· Infection. °· Damage to the common bile duct, nerves, arteries, veins, or other internal organs such as the stomach, liver, or intestines. °· Bleeding. °· A stone may remain in the common bile duct. °· A bile leak from the cyst duct that is clipped when your gallbladder is removed. °· The need to convert to open surgery, which requires a larger incision in the abdomen. This may be necessary if your surgeon thinks it is not safe to continue with a laparoscopic procedure. °BEFORE THE  PROCEDURE °· Ask your health care provider about changing or stopping any regular medicines. You will need to stop taking aspirin or blood thinners at least 5 days prior to surgery. °· Do not eat or drink anything after midnight the night before surgery. °· Let your health care provider know if you develop a cold or other infectious problem before surgery. °PROCEDURE  °· You will be given medicine to make you sleep through the procedure (general anesthetic). A breathing tube will be placed in your mouth. °· When you are asleep, your surgeon will make several small cuts (incisions) in your abdomen. °· A thin, lighted tube with a tiny camera on the end (laparoscope) is inserted through one of the small incisions. The camera on the laparoscope sends a picture to a TV screen in the operating room. This gives the surgeon a good view inside your abdomen. °· A gas will be pumped into your abdomen. This expands your abdomen so that the surgeon has more room to perform the surgery. °· Other tools needed for the procedure are inserted through the other incisions. The gallbladder is removed through one of the incisions. °· After the removal of your gallbladder, the incisions will be closed with stitches, staples, or skin glue. °AFTER THE PROCEDURE °· You will be taken to a recovery area where your progress will be checked often. °· You may be allowed to go home the same day if your pain is controlled and you can tolerate liquids. °Document Released: 03/09/2005 Document Revised: 12/28/2012 Document Reviewed: 10/19/2012 °ExitCare® Patient Information ©2014 [employer]. ° °CCS ______[facility] °LAPAROSCOPIC SURGERY: POST OP INSTRUCTIONS °Always review your discharge instruction sheet   given to you by the facility where your surgery was performed. °IF YOU HAVE DISABILITY OR FAMILY LEAVE FORMS, YOU MUST BRING THEM TO THE OFFICE FOR PROCESSING.   °DO NOT GIVE THEM TO YOUR DOCTOR. ° °1. A prescription for pain  medication may be given to you upon discharge.  Take your pain medication as prescribed, if needed.  If narcotic pain medicine is not needed, then you may take acetaminophen (Tylenol) or ibuprofen (Advil) as needed. °2. Take your usually prescribed medications unless otherwise directed. °3. If you need a refill on your pain medication, please contact your pharmacy.  They will contact our office to request authorization. Prescriptions will not be filled after 5pm or on week-ends. °4. You should follow a light diet the first few days after arrival home, such as soup and crackers, etc.  Be sure to include lots of fluids daily. °5. Most patients will experience some swelling and bruising in the area of the incisions.  Ice packs will help.  Swelling and bruising can take several days to resolve.  °6. It is common to experience some constipation if taking pain medication after surgery.  Increasing fluid intake and taking a stool softener (such as Colace) will usually help or prevent this problem from occurring.  A mild laxative (Milk of Magnesia or Miralax) should be taken according to package instructions if there are no bowel movements after 48 hours. °7. Unless discharge instructions indicate otherwise, you may remove your bandages 24-48 hours after surgery, and you may shower at that time.  You may have steri-strips (small skin tapes) in place directly over the incision.  These strips should be left on the skin for 7-10 days.  If your surgeon used skin glue on the incision, you may shower in 24 hours.  The glue will flake off over the next 2-3 weeks.  Any sutures or staples will be removed at the office during your follow-up visit. °8. ACTIVITIES:  You may resume regular (light) daily activities beginning the next day--such as daily self-care, walking, climbing stairs--gradually increasing activities as tolerated.  You may have sexual intercourse when it is comfortable.  Refrain from any heavy lifting or straining  until approved by your doctor. °a. You may drive when you are no longer taking prescription pain medication, you can comfortably wear a seatbelt, and you can safely maneuver your car and apply brakes. °b. RETURN TO WORK:  __________________________________________________________ °9. You should see your doctor in the office for a follow-up appointment approximately 2-3 weeks after your surgery.  Make sure that you call for this appointment within a day or two after you arrive home to insure a convenient appointment time. °10. OTHER INSTRUCTIONS: __________________________________________________________________________________________________________________________ __________________________________________________________________________________________________________________________ °WHEN TO CALL YOUR DOCTOR: °1. Fever over 101.0 °2. Inability to urinate °3. Continued bleeding from incision. °4. Increased pain, redness, or drainage from the incision. °5. Increasing abdominal pain ° °The clinic staff is available to answer your questions during regular business hours.  Please don’t hesitate to call and ask to speak to one of the nurses for clinical concerns.  If you have a medical emergency, go to the nearest emergency room or call 911.  A surgeon from [facility] is always on call at the hospital. °[address], [address], [place], [place]  [address] ? [address] [address], [place], [place]   [address] °[phone] ? [phone] ? FAX [phone] °Web site: [email or web address] ° °

## 2013-04-19 NOTE — Discharge Summary (Signed)
Agree with above.   S/p lap chole and ERCP

## 2013-04-19 NOTE — Discharge Summary (Signed)
Physician Discharge Summary  Patient ID: Debra [name] MRN: [id number] DOB/AGE: 05-02-1945 68 y.o.  Admit date: 04/13/2013 Discharge date: 04/19/2013  Admitting Diagnosis:  1. Symptomatic cholelithiasis  2. AODM with neuropathy  3. Hypertension  4. Dyslipidemia  5. Hx of diverticulits, colostomy/multiple abdominal surgeries/Ventral hernia  6. Morbid obesity BMI 51  7. Chronic venous insuffiencey  8. Hx of LLE DVT last year, not placed on anticoagulation  9. Deconditioning requires cane to walk.  Discharge Diagnosis Patient Active Problem List   Diagnosis Date Noted  . Choledocholithiasis with acute cholecystitis 04/14/2013  . Symptomatic cholelithiasis 04/13/2013  . Neuropathic pain of both legs 04/04/2013  . Severe obesity (BMI >= 40) 11/28/2012  . Obesity (BMI 30-39.9) 11/28/2012  . Diabetes mellitus with neurological manifestation   . Hypertension goal BP (blood pressure) < 130/80   . Osteopenia   . Chronic venous insufficiency   . Leg edema   . Acute bronchitis, viral 03/30/2012  . Hypertensive retinopathy, grade 2 03/30/2012  . Medial epicondylitis 02/11/2012  . Superficial thrombophlebitis of left leg 07/15/2011  . GERD (gastroesophageal reflux disease) 02/25/2011  . Right knee pain 02/16/2011  . Preventive measure 09/11/2010  . Neck pain 07/03/2010  . OSTEOPENIA 04/12/2006  . Type 2 diabetes, uncontrolled, with neuropathy 03/08/2006  . HYPERLIPIDEMIA 03/08/2006  . MORBID OBESITY 03/08/2006  . HYPERTENSION 03/08/2006  . LEG EDEMA, CHRONIC 03/08/2006    Consultants Dr. Docia Chuck. [facility]- gastroenterologist  Dr. Shanon Brow Tat- internal medicine   Imaging: Dg Ercp Biliary & Pancreatic Ducts  04/17/2013   CLINICAL DATA:  ERCP with sphincterotomy and balloon occlusion cholangiogram  EXAM: ERCP  TECHNIQUE: Multiple spot images obtained with the fluoroscopic device and submitted for interpretation post-procedure.  COMPARISON:  DG CHOLANGIOGRAM OPERATIVE dated 04/14/2013; US  ABDOMEN COMPLETE dated 04/13/2013; CT ABD W/CM dated 03/11/2008  FINDINGS: Seven spot intraoperative fluoroscopic images of the right upper quadrant during ERCP are provided for review.  Initial image demonstrates an ERCP probe overlying the right upper abdominal quadrant. Cholecystectomy clips overlie the expected location of the gallbladder fossa.  Subsequent images demonstrate selective cannulation and opacification of the common bile duct which appears mildly dilated. There is minimal opacification the central aspect of the biliary tree which appears mildly dilated centrally. There are no discrete filling defects within the opacified portions of the biliary tree.  There is insufflation of a balloon within the peripheral aspect of the proper hepatic duct. The subsequent images demonstrates the passage of contrast into the descending duodenum.  There is no definitive opacification of the residual cystic duct. No definitive opacification of the pancreatic duct.  IMPRESSION: ERCP with biliary sweeping and presumed sphincterotomy as above.  These images were submitted for radiologic interpretation only. Please see the procedural report for the amount of contrast and the fluoroscopy time utilized.   Electronically Signed   By: Sandi Mariscal M.D.   On: 04/17/2013 12:05    Procedures Dr. Edsel Petrin. Dalbert Batman (04/14/2013 - Laparoscopic lysis of adhesions, Laparoscopic cholecystectomy with IOC Dr. Docia Chuck. Henrene Pastor (04/17/2013) - ERCP with sphincterotomy/papillotomy and ERCP with removal of calculus/calculi  Hospital Course:  68 y/o white female who presented to [facility] with c/o abdominal pain and nausea which started morning of 1/21.  Stated the pain was at first generalized and described it as a sense of fullness preventing her from eating much.  The pain got better during the day and she was able to eat lunch and dinner.  Around 3AM 1/22, she woke up  with acute pain localized to the RUQ radiating to her back.  Denied fever,  chills, cough, chest pain, urinary symptoms, vomiting, diarrhea, and constipation.  Abdominal ultrasound showed layering gallstones present, wall thickness at 2.49mm, a positive sonographic murphy sign, CBD 7.3 mm, and mild extrahepatic biliary dilation.    Patient was admitted and Internal Medicine were consulted to manage the patient's multiple co-morbidities.  Pt underwent lap chole with IOC on 1/23.  There were extensive adhesions throughout the lower and mid abdomen which took 45 minutes to take down before trocars could be put in.  Cholangiogram revealed choledocholithiasis and pt had to undergo ERCP with sphincterotomy/papillotomy and ERCP with removal of calculus/calculi on 1/26.  Tolerated procedures well and was transferred to the floor. Lipase and LFTs elevated after ERCP procedure but no clinical signs of pancreatitis.  She was kept an extra day to monitor and recheck labs.  On day of discharge, lipase levels improved from 497 to 122 U/L, total bilirubin from 5.3 to 4.4.  AST increased from 130 to 164 and ALT increased from 243 to 244.  Diet was advanced as tolerated.  On HD #6, the patient was tolerating diet, ambulating well, pain well controlled, vital signs stable, incisions c/d/i and felt stable for discharge home.  No BMs but Colace started 1/27. Patient will follow up in our office in 1 week to recheck labs (lipase, LFTs) and post-op check in 3 weeks.  She knows to call with questions or concerns and knows to follow up with her primary care provider to manage her co-morbidities.     Medication List         ACCU-CHEK AVIVA PLUS test strip  Generic drug:  glucose blood     acetaminophen-codeine 300-30 MG per tablet  Commonly known as:  TYLENOL #3  Take 1-2 tablets by mouth every 4 (four) hours as needed for moderate pain.     atorvastatin 20 MG tablet  Commonly known as:  LIPITOR  Take 1 tablet (20 mg total) by mouth daily.     Capsaicin-Menthol-Methyl Sal 0.025-1-12 % Crea   Commonly known as:  capsaicin-methyl sal-menthol  Apply 1 Tube topically 4 (four) times daily.     diphenhydrAMINE 25 MG tablet  Commonly known as:  BENADRYL  Take 2 tablets (50 mg total) by mouth every 6 (six) hours as needed for itching.     DSS 100 MG Caps  Take 100 mg by mouth 2 (two) times daily as needed for mild constipation.     hydrochlorothiazide 25 MG tablet  Commonly known as:  HYDRODIURIL  Take 25 mg by mouth daily.     ibuprofen 200 MG tablet  Commonly known as:  ADVIL,MOTRIN  Take 400 mg by mouth every 6 (six) hours as needed for pain. For pain     lisinopril 40 MG tablet  Commonly known as:  PRINIVIL,ZESTRIL  Take 40 mg by mouth daily.     metFORMIN 1000 MG tablet  Commonly known as:  GLUCOPHAGE  TAKE 1 TABLET BY MOUTH TWICE DAILY WITH A MEAL     metoprolol succinate 25 MG 24 hr tablet  Commonly known as:  TOPROL-XL  Take 25 mg by mouth daily.     omeprazole 20 MG capsule  Commonly known as:  PRILOSEC  Take 1 capsule (20 mg total) by mouth daily.     OSENI 12.5-30 MG Tabs  Generic drug:  Alogliptin-Pioglitazone  Take 1 tablet by mouth daily.     pioglitazone 30  MG tablet  Commonly known as:  ACTOS  TAKE 1 TABLET BY MOUTH EVERY MORNING     pregabalin 75 MG capsule  Commonly known as:  LYRICA  Take 1 capsule (75 mg total) by mouth 3 (three) times daily.             Follow-up Information   Follow up with Adin Hector, MD. Schedule an appointment as soon as possible for a visit in 2 weeks.   Specialty:  General Surgery   Contact information:   [address] [place] [address] [phone]       Follow up with Delia Chimes, NP. Schedule an appointment as soon as possible for a visit in 2 weeks. (Call and let them see you in 1-2 weeks, so they can help with your medical issues, including diabetes, blood pressure and fluid status.)    Specialty:  Nurse Practitioner   Contact information:   [address] [place] [address] [phone]       Follow up with [place] On 05/16/2013. (Your appointment is at 1:30pm, please arrive at least 30 minutes before your appointment to complete your check in paperwork.  If you are unable to arrive 30 minutes prior to your appointment time we may have to cancel or reschedule you.)    Contact information:   [place]   [place] [address] [phone]       Signed: Janifer Adie PA-S [facility] [phone]  04/19/2013, 8:47 AM   ----------------------------------------------------------------------------------------------------------- General Surgery PA Preceptor Note:  I agree with the above PA students findings as above.    Coralie Keens, PA-C General Surgery [facility] Pager: [phone] Office: [phone]

## 2013-04-19 NOTE — Care Management Note (Signed)
    Page 1 of 1   04/19/2013     12:02:17 PM   CARE MANAGEMENT NOTE 04/19/2013  Patient:  Debra [name], Debra [name]   Account Number:  [id number]  Date Initiated:  04/14/2013  Documentation initiated by:  Sherrin Daisy  Subjective/Objective Assessment:   dx acute cholecystitis, cholelithiasis and obstructed CBD     Action/Plan:   Pt is from home   Anticipated DC Date:  04/16/2013   Anticipated DC Plan:  [place]  CM consult      Choice offered to / List presented to:             Status of service:  Completed, signed off Medicare Important Message given?   (If response is "NO", the following Medicare IM given date fields will be blank) Date Medicare IM given:   Date Additional Medicare IM given:    Discharge Disposition:  HOME/SELF CARE  Per UR Regulation:  Reviewed for med. necessity/level of care/duration of stay  If discussed at [place] of Stay Meetings, dates discussed:    Comments:

## 2013-04-24 ENCOUNTER — Telehealth (INDEPENDENT_AMBULATORY_CARE_PROVIDER_SITE_OTHER): Payer: Self-pay

## 2013-04-24 NOTE — Telephone Encounter (Signed)
Patient states she is having blood in her stool, she is asking to be seen. # [id number] please advise

## 2013-04-24 NOTE — Telephone Encounter (Signed)
The blood in her stool is unrelated to her cholecystectomy. Please refer her to her primary care physician or her gastroenterologist  Debra [name]. Dalbert Batman, M.D., [facility] General and Minimally invasive Surgery Breast and Colorectal Surgery Office:   [phone] Pager:   [phone]

## 2013-04-25 NOTE — Telephone Encounter (Signed)
Patient states she is to see her PCP in the morning Advised her to call if she has further questions

## 2013-04-26 ENCOUNTER — Encounter (INDEPENDENT_AMBULATORY_CARE_PROVIDER_SITE_OTHER): Payer: Self-pay | Admitting: General Surgery

## 2013-04-26 ENCOUNTER — Ambulatory Visit (INDEPENDENT_AMBULATORY_CARE_PROVIDER_SITE_OTHER): Payer: Medicare HMO | Admitting: Nurse Practitioner

## 2013-04-26 ENCOUNTER — Ambulatory Visit (INDEPENDENT_AMBULATORY_CARE_PROVIDER_SITE_OTHER): Payer: Medicare HMO | Admitting: General Surgery

## 2013-04-26 ENCOUNTER — Encounter: Payer: Self-pay | Admitting: Nurse Practitioner

## 2013-04-26 VITALS — BP 133/88 | HR 68 | Temp 98.6°F | Resp 18 | Ht 64.0 in | Wt 270.2 lb

## 2013-04-26 VITALS — BP 124/68 | HR 74 | Temp 97.7°F | Wt 270.4 lb

## 2013-04-26 DIAGNOSIS — G8918 Other acute postprocedural pain: Secondary | ICD-10-CM

## 2013-04-26 DIAGNOSIS — R1011 Right upper quadrant pain: Secondary | ICD-10-CM

## 2013-04-26 MED ORDER — TRAMADOL HCL 50 MG PO TABS: 50.0000 mg | ORAL_TABLET | Freq: Four times a day (QID) | ORAL | Status: DC | PRN

## 2013-04-26 NOTE — Patient Instructions (Signed)
Laparoscopic Cholecystectomy, Care After °Refer to this sheet in the next few weeks. These instructions provide you with information on caring for yourself after your procedure. Your health care provider may also give you more specific instructions. Your treatment has been planned according to current medical practices, but problems sometimes occur. Call your health care provider if you have any problems or questions after your procedure. °WHAT TO EXPECT AFTER THE PROCEDURE °After your procedure, it is typical to have the following: °· Pain at your incision sites. You will be given pain medicines to control the pain. °· Mild nausea or vomiting. This should improve after the first 24 hours. °· Bloating and possibly shoulder pain from the gas used during the procedure. This will improve after the first 24 hours. °HOME CARE INSTRUCTIONS  °· Change bandages (dressings) as directed by your health care provider. °· Keep the wound dry and clean. You may wash the wound gently with soap and water. Gently blot or dab the area dry. °· Do not take baths or use swimming pools or hot tubs for 2 weeks or until your health care provider approves. °· Only take over-the-counter or prescription medicines as directed by your health care provider. °· Continue your normal diet as directed by your health care provider. °· Do not lift anything heavier than 10 pounds (4.5 kg) until your health care provider approves. °· Do not play contact sports for 1 week or until your health care provider approves. °SEEK MEDICAL CARE IF:  °· You have redness, swelling, or increasing pain in the wound. °· You notice yellowish-white fluid (pus) coming from the wound. °· You have drainage from the wound that lasts longer than 1 day. °· You notice a bad smell coming from the wound or dressing. °· Your surgical cuts (incisions) break open. °SEEK IMMEDIATE MEDICAL CARE IF:  °· You develop a rash. °· You have difficulty breathing. °· You have chest pain. °· You  have a fever. °· You have increasing pain in the shoulders (shoulder strap areas). °· You have dizzy episodes or faint while standing. °· You have severe abdominal pain. °· You feel sick to your stomach (nauseous) or throw up (vomit) and this lasts for more than 1 day. °Document Released: 03/09/2005 Document Revised: 12/28/2012 Document Reviewed: 10/19/2012 °ExitCare® Patient Information ©2014 [employer]. ° °

## 2013-04-26 NOTE — Progress Notes (Signed)
Patient ID: [name], female   DOB: Jun 15, 1945, 68 y.o.   MRN: [id number] Post op course The patient is a 68 year old female status post laparoscopic cholecystectomy and choledocholithiasis status post ERCP by Dr. Dalbert Batman on 04/14/2013. Patient comes in today with a chief complaint of abdominal pain to right upper quadrant approximately 3 or 4 cm inferior medial to her incision site. The patient states she has not had any pain medication was given a prescription for tramadol today.  Patient states that her pain is mainly at night when sleeping and laying on her right side.  On Exam: Wounds are clean dry and intact, there is no erythema or induration or fluctuance.  Assessment and Plan 68 year old female status post laparoscopic cholecystectomy 1. I recommend start her tramadol. Suggested that if this does not help the next one to 2 days she can follow back up. 2. The patient has followup plan with Dr. Dalbert Batman which she will keep.   Ralene Ok, MD [facility] General & Minimally Invasive Surgery Trauma & Emergency Surgery

## 2013-04-26 NOTE — Progress Notes (Signed)
Patient ID: Debra [name], female   DOB: Mar 14, 1946, 68 y.o.   MRN: [id number]    Allergies  Allergen Reactions  . Aspirin Nausea Only    And causes bruises  . Banana Hives  . Hydrocodone Hives  . Neomycin-Polymyxin-Gramicidin Itching and Swelling    Eye drops caused swelling in face and rash and itching on arms and neck  . Penicillins Hives and Itching  . Latex Hives and Rash    Chief Complaint  Patient presents with  . Follow-up     having pain in the abdomen where she had her gall bladder removed and would like something for the pain. need an order for walker & wheel chair.    HPI: Patient is a 68 y.o. female seen in the office today for pain after gallbladder surgery; also reports blood with wiping after BM; this happen 3 times over 3 days-- have not had any more since (which was 3 days ago); no dark stools.  Pt had gallbladder removed on 04/13/13; left the hospital on the 28th, has had tylenol 3 which made her sick so she is not able to take this; over the last 2 days her pain is much worse, can not sleep at night; was told to take advil which does not help; pain at surgery site, sharp achey pain, last about 3-4 mins has to get up and move around; can not associate pain with anything she did to make pain worse, moving around makes it better; follow up with surgery on 24th of this month Having regular BM; no pain with urination; does have gas-- has not taken anything for this No fevers or chills  Review of Systems:  Review of Systems  Constitutional: Negative for fever, chills and malaise/fatigue.  Respiratory: Negative for cough and shortness of breath.   Cardiovascular: Negative for chest pain and palpitations.  Gastrointestinal: Positive for abdominal pain. Negative for heartburn, nausea, vomiting, diarrhea, constipation, blood in stool and melena.  Genitourinary: Negative for dysuria, urgency and frequency.  Musculoskeletal: Positive for myalgias (due to surgery ).    Neurological: Negative for weakness.     Past Medical History  Diagnosis Date  . Diabetes mellitus with neurological manifestation   . Hypertension goal BP (blood pressure) < 130/80   . Hyperlipidemia LDL goal < 100   . Osteopenia     DEXA scan 7/07  . Chronic venous insufficiency   . Morbid obesity   . Diverticulitis     h/o 2-3 episodes in past  . Leg edema     secondary to chronic venous insufficiency  . Cellulitis of left leg     history of, most recent episode 01/09   Past Surgical History  Procedure Laterality Date  . Appendectomy    . Abdominal hysterectomy    . Colectomy      with diverting colsotmy and revision in the 1990's for diverticulitis  . Eye surgery  8/13    left cataract removal & retinal repair  . Cholecystectomy    . Cholecystectomy N/A 04/14/2013    Procedure: LAPAROSCOPIC CHOLECYSTECTOMY WITH INTRAOPERATIVE CHOLANGIOGRAM;  Surgeon: Adin Hector, MD;  Location: [facility];  Service: General;  Laterality: N/A;  . Laparoscopic lysis of adhesions N/A 04/14/2013    Procedure: LAPAROSCOPIC LYSIS OF ADHESIONS;  Surgeon: Adin Hector, MD;  Location: [facility];  Service: General;  Laterality: N/A;  . Ercp N/A 04/17/2013    Procedure: ENDOSCOPIC RETROGRADE CHOLANGIOPANCREATOGRAPHY (ERCP);  Surgeon: Irene Shipper, MD;  Location:  [facility];  Service: Endoscopy;  Laterality: N/A;  note pt want general anesthesia for this case.  preferto perform in endo unit, but if unavailable please arrange time in OR   Social History:   reports that she has never smoked. She has never used smokeless tobacco. She reports that she does not drink alcohol or use illicit drugs.  Family History  Problem Relation Age of Onset  . Heart disease Mother   . Heart disease Father   . Heart disease Brother   . Cancer Brother   . Cancer Brother   . Hypertension Sister   . Heart disease Sister     Medications: Patient's Medications  New Prescriptions   No medications on file   Previous Medications   ACCU-CHEK AVIVA PLUS TEST STRIP       ACETAMINOPHEN-CODEINE (TYLENOL #3) 300-30 MG PER TABLET    Take 1-2 tablets by mouth every 4 (four) hours as needed for moderate pain.   ALOGLIPTIN-PIOGLITAZONE (OSENI) 12.5-30 MG TABS    Take 1 tablet by mouth daily.   ATORVASTATIN (LIPITOR) 20 MG TABLET    Take 1 tablet (20 mg total) by mouth daily.   CAPSAICIN-MENTHOL-METHYL SAL (CAPSAICIN-METHYL SAL-MENTHOL) 0.025-1-12 % CREA    Apply 1 Tube topically 4 (four) times daily.   DIPHENHYDRAMINE (BENADRYL) 25 MG TABLET    Take 2 tablets (50 mg total) by mouth every 6 (six) hours as needed for itching.   DOCUSATE SODIUM 100 MG CAPS    Take 100 mg by mouth 2 (two) times daily as needed for mild constipation.   HYDROCHLOROTHIAZIDE (HYDRODIURIL) 25 MG TABLET    Take 25 mg by mouth daily.   IBUPROFEN (ADVIL,MOTRIN) 200 MG TABLET    Take 400 mg by mouth every 6 (six) hours as needed for pain. For pain   LISINOPRIL (PRINIVIL,ZESTRIL) 40 MG TABLET    Take 40 mg by mouth daily.   METFORMIN (GLUCOPHAGE) 1000 MG TABLET    TAKE 1 TABLET BY MOUTH TWICE DAILY WITH A MEAL   METOPROLOL SUCCINATE (TOPROL-XL) 25 MG 24 HR TABLET    Take 25 mg by mouth daily.   OMEPRAZOLE (PRILOSEC) 20 MG CAPSULE    Take 1 capsule (20 mg total) by mouth daily.   PIOGLITAZONE (ACTOS) 30 MG TABLET    TAKE 1 TABLET BY MOUTH EVERY MORNING   PREGABALIN (LYRICA) 75 MG CAPSULE    Take 1 capsule (75 mg total) by mouth 3 (three) times daily.  Modified Medications   No medications on file  Discontinued Medications   No medications on file     Physical Exam:  Filed Vitals:   04/26/13 1025  BP: 124/68  Pulse: 74  Temp: 97.7 F (36.5 C)  TempSrc: Oral  Weight: 270 lb 6.4 oz (122.653 kg)  SpO2: 97%    Physical Exam  Constitutional: She is oriented to person, place, and time and well-developed, well-nourished, and in no distress. No distress.  Cardiovascular: Normal rate, regular rhythm and normal heart sounds.    Pulmonary/Chest: Effort normal and breath sounds normal. No respiratory distress.  Abdominal: Soft. Normal appearance and bowel sounds are normal. She exhibits no distension and no mass. There is tenderness. There is no rebound and no guarding.  Obese abdmen; multiple lap sites, no signs of infection at lap sites, tender to upper right quad  Genitourinary: Rectum normal.  Musculoskeletal: She exhibits no edema.  Neurological: She is alert and oriented to person, place, and time.  Skin: Skin is warm  and dry. She is not diaphoretic. No erythema.     Labs reviewed: Basic Metabolic Panel:  Recent Labs  11/28/12 1054  04/14/13 0524 04/15/13 0526 04/18/13 0531 04/19/13 0442  NA 147*  < > 136* 138 139 135*  K 4.6  < > 3.9 3.3* 3.2* 3.7  CL 102  < > 97 96 94* 93*  CO2 25  < > 24 29 30 30   GLUCOSE 129*  < > 224* 202* 187* 190*  BUN 18  < > 14 8 13 11   CREATININE 0.91  < > 0.71 0.76 0.81 0.72  CALCIUM 9.9  < > 9.0 8.7 8.9 8.7  MG  --   --  1.2*  --   --  1.5  TSH 3.630  --   --   --   --   --   < > = values in this interval not displayed. Liver Function Tests:  Recent Labs  04/15/13 0526 04/18/13 0531 04/19/13 0442  AST 294* 130* 164*  ALT 518* 243* 244*  ALKPHOS 152* 176* 194*  BILITOT 1.0 5.3* 4.4*  PROT 6.5 6.4 6.1  ALBUMIN 3.2* 2.6* 2.5*    Recent Labs  01/19/13 1422  04/14/13 1625 04/15/13 0526 04/18/13 0531 04/19/13 0442  LIPASE 24  < > 18  --  497* 122*  AMYLASE 52  --   --  43  --   --   < > = values in this interval not displayed. No results found for this basename: AMMONIA,  in the last 8760 hours CBC:  Recent Labs  11/28/12 1054  01/19/13 1422  04/13/13 0620 04/14/13 0524 04/15/13 0526 04/18/13 0531  WBC 7.9  --  7.9  < > 7.0 3.4* 9.7 7.5  NEUTROABS 3.9  --  4.3  --  3.4  --   --   --   HGB 13.0  --  13.1  --  12.9 12.7 12.5 12.4  HCT 38.4  --  38.6  --  38.2 38.6 37.1 36.7  MCV 91  --  92  --  92.9 93.0 92.1 91.1  PLT  --   < > 247  --  203  189 192 203  < > = values in this interval not displayed. Lipid Panel: No results found for this basename: CHOL, HDL, LDLCALC, TRIG, CHOLHDL, LDLDIRECT,  in the last 8760 hours TSH:  Recent Labs  11/28/12 1054  TSH 3.630    Assessment/Plan 1. Abdominal pain, right upper quadrant -was improving after surgery now worse; not tolerating tylenol 3 -question if cause is due to gas however will not rule out infectious process -may take simethicone prn  - CBC With differential/Platelet - Comprehensive metabolic panel - Amylase - Lipase - traMADol (ULTRAM) 50 MG tablet; Take 1 tablet (50 mg total) by mouth every 6 (six) hours as needed for pain -attempted to call surgeron without return; pt to call [facility] and make follow up appt  -labs sent stat

## 2013-04-27 LAB — CBC WITH DIFFERENTIAL
BASOS ABS: 0.1 10*3/uL (ref 0.0–0.2)
BASOS: 1 %
EOS ABS: 0.5 10*3/uL — AB (ref 0.0–0.4)
Eos: 5 %
HCT: 39.3 % (ref 34.0–46.6)
HEMOGLOBIN: 13.2 g/dL (ref 11.1–15.9)
IMMATURE GRANS (ABS): 0 10*3/uL (ref 0.0–0.1)
Immature Granulocytes: 0 %
Lymphocytes Absolute: 2 10*3/uL (ref 0.7–3.1)
Lymphs: 22 %
MCH: 30.6 pg (ref 26.6–33.0)
MCHC: 33.6 g/dL (ref 31.5–35.7)
MCV: 91 fL (ref 79–97)
MONOS ABS: 0.7 10*3/uL (ref 0.1–0.9)
Monocytes: 8 %
NEUTROS PCT: 64 %
Neutrophils Absolute: 5.6 10*3/uL (ref 1.4–7.0)
Platelets: 449 10*3/uL — ABNORMAL HIGH (ref 150–379)
RBC: 4.31 x10E6/uL (ref 3.77–5.28)
RDW: 13.9 % (ref 12.3–15.4)
WBC: 8.9 10*3/uL (ref 3.4–10.8)

## 2013-04-27 LAB — COMPREHENSIVE METABOLIC PANEL
A/G RATIO: 1.3 (ref 1.1–2.5)
ALT: 41 IU/L — AB (ref 0–32)
AST: 21 IU/L (ref 0–40)
Albumin: 3.9 g/dL (ref 3.6–4.8)
Alkaline Phosphatase: 139 IU/L — ABNORMAL HIGH (ref 39–117)
BUN/Creatinine Ratio: 25 (ref 11–26)
BUN: 21 mg/dL (ref 8–27)
CHLORIDE: 96 mmol/L — AB (ref 97–108)
CO2: 22 mmol/L (ref 18–29)
Calcium: 9.8 mg/dL (ref 8.7–10.3)
Creatinine, Ser: 0.83 mg/dL (ref 0.57–1.00)
GFR calc Af Amer: 85 mL/min/{1.73_m2} (ref >59)
GFR, EST NON AFRICAN AMERICAN: 74 mL/min/{1.73_m2} (ref >59)
GLUCOSE: 162 mg/dL — AB (ref 65–99)
Globulin, Total: 2.9 g/dL (ref 1.5–4.5)
POTASSIUM: 4.5 mmol/L (ref 3.5–5.2)
SODIUM: 141 mmol/L (ref 134–144)
TOTAL PROTEIN: 6.8 g/dL (ref 6.0–8.5)
Total Bilirubin: 0.9 mg/dL (ref 0.0–1.2)

## 2013-04-27 LAB — AMYLASE: AMYLASE: 58 U/L (ref 31–124)

## 2013-04-27 LAB — LIPASE: Lipase: 84 U/L — ABNORMAL HIGH (ref 0–59)

## 2013-05-03 ENCOUNTER — Telehealth: Payer: Self-pay | Admitting: *Deleted

## 2013-05-03 ENCOUNTER — Encounter (HOSPITAL_COMMUNITY): Payer: Self-pay | Admitting: Emergency Medicine

## 2013-05-03 ENCOUNTER — Emergency Department (HOSPITAL_COMMUNITY)
Admission: EM | Admit: 2013-05-03 | Discharge: 2013-05-03 | Disposition: A | Discharge status: Home / Self Care | Payer: Medicare HMO | Attending: Emergency Medicine | Admitting: Emergency Medicine

## 2013-05-03 ENCOUNTER — Emergency Department (HOSPITAL_COMMUNITY): Payer: Medicare HMO

## 2013-05-03 DIAGNOSIS — Z79899 Other long term (current) drug therapy: Secondary | ICD-10-CM | POA: Insufficient documentation

## 2013-05-03 DIAGNOSIS — L03031 Cellulitis of right toe: Secondary | ICD-10-CM

## 2013-05-03 DIAGNOSIS — Z8739 Personal history of other diseases of the musculoskeletal system and connective tissue: Secondary | ICD-10-CM | POA: Insufficient documentation

## 2013-05-03 DIAGNOSIS — L03039 Cellulitis of unspecified toe: Principal | ICD-10-CM | POA: Insufficient documentation

## 2013-05-03 DIAGNOSIS — E1142 Type 2 diabetes mellitus with diabetic polyneuropathy: Secondary | ICD-10-CM | POA: Insufficient documentation

## 2013-05-03 DIAGNOSIS — Z91040 Latex allergy status: Secondary | ICD-10-CM | POA: Insufficient documentation

## 2013-05-03 DIAGNOSIS — Z8719 Personal history of other diseases of the digestive system: Secondary | ICD-10-CM | POA: Insufficient documentation

## 2013-05-03 DIAGNOSIS — L02619 Cutaneous abscess of unspecified foot: Secondary | ICD-10-CM | POA: Insufficient documentation

## 2013-05-03 DIAGNOSIS — Z880 Allergy status to penicillin: Secondary | ICD-10-CM | POA: Insufficient documentation

## 2013-05-03 DIAGNOSIS — I10 Essential (primary) hypertension: Secondary | ICD-10-CM | POA: Insufficient documentation

## 2013-05-03 DIAGNOSIS — E1149 Type 2 diabetes mellitus with other diabetic neurological complication: Secondary | ICD-10-CM | POA: Insufficient documentation

## 2013-05-03 DIAGNOSIS — Z9089 Acquired absence of other organs: Secondary | ICD-10-CM | POA: Insufficient documentation

## 2013-05-03 DIAGNOSIS — E785 Hyperlipidemia, unspecified: Secondary | ICD-10-CM | POA: Insufficient documentation

## 2013-05-03 LAB — CBC WITH DIFFERENTIAL/PLATELET
BASOS ABS: 0 10*3/uL (ref 0.0–0.1)
Basophils Relative: 0 % (ref 0–1)
EOS ABS: 0.2 10*3/uL (ref 0.0–0.7)
Eosinophils Relative: 2 % (ref 0–5)
HCT: 40.2 % (ref 36.0–46.0)
Hemoglobin: 13.4 g/dL (ref 12.0–15.0)
Lymphocytes Relative: 19 % (ref 12–46)
Lymphs Abs: 1.9 10*3/uL (ref 0.7–4.0)
MCH: 30.9 pg (ref 26.0–34.0)
MCHC: 33.3 g/dL (ref 30.0–36.0)
MCV: 92.8 fL (ref 78.0–100.0)
Monocytes Absolute: 1.1 10*3/uL — ABNORMAL HIGH (ref 0.1–1.0)
Monocytes Relative: 10 % (ref 3–12)
NEUTROS PCT: 68 % (ref 43–77)
Neutro Abs: 6.9 10*3/uL (ref 1.7–7.7)
PLATELETS: 316 10*3/uL (ref 150–400)
RBC: 4.33 MIL/uL (ref 3.87–5.11)
RDW: 13.6 % (ref 11.5–15.5)
WBC: 10.2 10*3/uL (ref 4.0–10.5)

## 2013-05-03 LAB — GLUCOSE, CAPILLARY
GLUCOSE-CAPILLARY: 135 mg/dL — AB (ref 70–99)
GLUCOSE-CAPILLARY: 161 mg/dL — AB (ref 70–99)

## 2013-05-03 LAB — BASIC METABOLIC PANEL
BUN: 20 mg/dL (ref 6–23)
CALCIUM: 9.8 mg/dL (ref 8.4–10.5)
CO2: 27 mEq/L (ref 19–32)
Chloride: 96 mEq/L (ref 96–112)
Creatinine, Ser: 0.87 mg/dL (ref 0.50–1.10)
GFR, EST AFRICAN AMERICAN: 79 mL/min — AB (ref >90)
GFR, EST NON AFRICAN AMERICAN: 68 mL/min — AB (ref >90)
Glucose, Bld: 177 mg/dL — ABNORMAL HIGH (ref 70–99)
POTASSIUM: 4.5 meq/L (ref 3.7–5.3)
SODIUM: 138 meq/L (ref 137–147)

## 2013-05-03 MED ORDER — MORPHINE SULFATE 2 MG/ML IJ SOLN: 2.0000 mg | Freq: Once | INTRAMUSCULAR | Status: DC

## 2013-05-03 MED ORDER — SODIUM CHLORIDE 0.9 % IV SOLN
INTRAVENOUS | Status: DC
  Administered 2013-05-03: 20:00:00 via INTRAVENOUS

## 2013-05-03 MED ORDER — OXYCODONE-ACETAMINOPHEN 5-325 MG PO TABS
1.0000 | ORAL_TABLET | Freq: Once | ORAL | Status: AC
  Administered 2013-05-03: 1 via ORAL
  Filled 2013-05-03: qty 1

## 2013-05-03 MED ORDER — HYDROCODONE-ACETAMINOPHEN 5-325 MG PO TABS: 1.0000 | ORAL_TABLET | ORAL | Status: DC | PRN

## 2013-05-03 MED ORDER — MORPHINE SULFATE 4 MG/ML IJ SOLN
4.0000 mg | Freq: Once | INTRAMUSCULAR | Status: AC
  Administered 2013-05-03: 4 mg via INTRAVENOUS
  Filled 2013-05-03: qty 1

## 2013-05-03 MED ORDER — ONDANSETRON HCL 4 MG/2ML IJ SOLN
4.0000 mg | Freq: Once | INTRAMUSCULAR | Status: AC
  Administered 2013-05-03: 4 mg via INTRAVENOUS
  Filled 2013-05-03: qty 2

## 2013-05-03 MED ORDER — OXYCODONE-ACETAMINOPHEN 5-325 MG PO TABS: 1.0000 | ORAL_TABLET | Freq: Three times a day (TID) | ORAL | Status: DC | PRN

## 2013-05-03 MED ORDER — CLINDAMYCIN HCL 150 MG PO CAPS: 150.0000 mg | ORAL_CAPSULE | Freq: Four times a day (QID) | ORAL | Status: DC

## 2013-05-03 MED ORDER — ONDANSETRON HCL 4 MG/2ML IJ SOLN: 4.0000 mg | Freq: Once | INTRAMUSCULAR | Status: DC

## 2013-05-03 MED ORDER — CLINDAMYCIN PHOSPHATE 600 MG/50ML IV SOLN
600.0000 mg | Freq: Once | INTRAVENOUS | Status: AC
  Administered 2013-05-03: 600 mg via INTRAVENOUS
  Filled 2013-05-03: qty 50

## 2013-05-03 NOTE — ED Provider Notes (Signed)
CSN: [id number]     Arrival date & time 05/03/13  1324 History   First MD Initiated Contact with Patient 05/03/13 1530     Chief Complaint  Patient presents with  . Leg Swelling     (Consider location/radiation/quality/duration/timing/severity/associated sxs/prior Treatment) HPI  Patient to the ER with complaints of pain, swelling and warmness to her right great toe and part of top of foot since yesterday. She is a diabetic and says she always has pains in her legs and her feet. She reports that this area hurts worse then her normal foot pain and she feels as though she can not move her toe. She denies having a fever, decreased appetite, nausea, vomiting,generalized weakness or diarrhea.  Past Medical History  Diagnosis Date  . Diabetes mellitus with neurological manifestation   . Hypertension goal BP (blood pressure) < 130/80   . Hyperlipidemia LDL goal < 100   . Osteopenia     DEXA scan 7/07  . Chronic venous insufficiency   . Morbid obesity   . Diverticulitis     h/o 2-3 episodes in past  . Leg edema     secondary to chronic venous insufficiency  . Cellulitis of left leg     history of, most recent episode 01/09   Past Surgical History  Procedure Laterality Date  . Appendectomy    . Abdominal hysterectomy    . Colectomy      with diverting colsotmy and revision in the 1990's for diverticulitis  . Eye surgery  8/13    left cataract removal & retinal repair  . Cholecystectomy    . Cholecystectomy N/A 04/14/2013    Procedure: LAPAROSCOPIC CHOLECYSTECTOMY WITH INTRAOPERATIVE CHOLANGIOGRAM;  Surgeon: Adin Hector, MD;  Location: [facility];  Service: General;  Laterality: N/A;  . Laparoscopic lysis of adhesions N/A 04/14/2013    Procedure: LAPAROSCOPIC LYSIS OF ADHESIONS;  Surgeon: Adin Hector, MD;  Location: [facility];  Service: General;  Laterality: N/A;  . Ercp N/A 04/17/2013    Procedure: ENDOSCOPIC RETROGRADE CHOLANGIOPANCREATOGRAPHY (ERCP);  Surgeon: Irene Shipper, MD;   Location: [facility];  Service: Endoscopy;  Laterality: N/A;  note pt want general anesthesia for this case.  preferto perform in endo unit, but if unavailable please arrange time in OR   Family History  Problem Relation Age of Onset  . Heart disease Mother   . Heart disease Father   . Heart disease Brother   . Cancer Brother   . Cancer Brother   . Hypertension Sister   . Heart disease Sister    History  Substance Use Topics  . Smoking status: Never Smoker   . Smokeless tobacco: Never Used  . Alcohol Use: No   OB History   Grav Para Term Preterm Abortions TAB SAB Ect Mult Living                 Review of Systems  The patient denies anorexia, fever, weight loss,, vision loss, decreased hearing, hoarseness, chest pain, syncope, dyspnea on exertion, peripheral edema, balance deficits, hemoptysis, abdominal pain, melena, hematochezia, severe indigestion/heartburn, hematuria, incontinence, genital sores, muscle weakness, suspicious skin lesions, transient blindness, difficulty walking, depression, unusual weight change, abnormal bleeding, enlarged lymph nodes, angioedema, and breast masses.    Allergies  Aspirin; Banana; Hydrocodone; Neomycin-polymyxin-gramicidin; Penicillins; Tramadol; and Latex  Home Medications   Current Outpatient Rx  Name  Route  Sig  Dispense  Refill  . Alogliptin-Pioglitazone (OSENI) 12.5-30 MG TABS   Oral   Take  1 tablet by mouth daily.         Marland Kitchen atorvastatin (LIPITOR) 20 MG tablet   Oral   Take 1 tablet (20 mg total) by mouth daily.   90 tablet   3   . Capsaicin-Menthol-Methyl Sal (CAPSAICIN-METHYL SAL-MENTHOL) 0.025-1-12 % CREA   Apply externally   Apply 1 Tube topically 4 (four) times daily.   1 Tube   2   . diphenhydrAMINE (BENADRYL) 25 MG tablet   Oral   Take 2 tablets (50 mg total) by mouth every 6 (six) hours as needed for itching.         . hydrochlorothiazide (HYDRODIURIL) 25 MG tablet   Oral   Take 25 mg by mouth daily.           Marland Kitchen ibuprofen (ADVIL,MOTRIN) 200 MG tablet   Oral   Take 400 mg by mouth every 6 (six) hours as needed for pain. For pain         . lisinopril (PRINIVIL,ZESTRIL) 40 MG tablet   Oral   Take 40 mg by mouth daily.         . metFORMIN (GLUCOPHAGE) 1000 MG tablet   Oral   Take 1,000 mg by mouth 2 (two) times daily with a meal.         . metoprolol succinate (TOPROL-XL) 25 MG 24 hr tablet   Oral   Take 25 mg by mouth daily.         Marland Kitchen omeprazole (PRILOSEC) 20 MG capsule   Oral   Take 1 capsule (20 mg total) by mouth daily.   30 capsule   3   . pioglitazone (ACTOS) 30 MG tablet   Oral   Take 30 mg by mouth every morning.         . pregabalin (LYRICA) 75 MG capsule   Oral   Take 1 capsule (75 mg total) by mouth 3 (three) times daily.   90 capsule   2   . clindamycin (CLEOCIN) 150 MG capsule   Oral   Take 1 capsule (150 mg total) by mouth every 6 (six) hours.   28 capsule   0   . HYDROcodone-acetaminophen (NORCO/VICODIN) 5-325 MG per tablet   Oral   Take 1-2 tablets by mouth every 4 (four) hours as needed.   10 tablet   0    BP 141/73  Pulse 82  Temp(Src) 99.8 F (37.7 C) (Oral)  Resp 20  SpO2 93%  LMP 07/02/1973 Physical Exam  Nursing note and vitals reviewed. Constitutional: She appears well-developed and well-nourished. No distress.  HENT:  Head: Normocephalic and atraumatic.  Eyes: Pupils are equal, round, and reactive to light.  Neck: Normal range of motion. Neck supple.  Cardiovascular: Normal rate and regular rhythm.   Pulmonary/Chest: Effort normal.  Abdominal: Soft.  Musculoskeletal:       Right foot: She exhibits decreased range of motion, tenderness, bony tenderness and swelling. She exhibits normal capillary refill, no crepitus, no deformity and no laceration.       Feet:  Cellulitis and tenderness to right great toe She denies being able to move her toe due to pain and has pain with passive ROM.   CR < 2 seconds  Strong pedal  pulses  No ulcers or wounds to skin.  Neurological: She is alert.  Skin: Skin is warm and dry.      ED Course  Procedures (including critical care time) Labs Review Labs Reviewed  CBC WITH DIFFERENTIAL - Abnormal;  Notable for the following:    Monocytes Absolute 1.1 (*)    All other components within normal limits  BASIC METABOLIC PANEL - Abnormal; Notable for the following:    Glucose, Bld 177 (*)    GFR calc non Af Amer 68 (*)    GFR calc Af Amer 79 (*)    All other components within normal limits  GLUCOSE, CAPILLARY - Abnormal; Notable for the following:    Glucose-Capillary 135 (*)    All other components within normal limits   Imaging Review Dg Toe Great Right  05/03/2013   CLINICAL DATA:  Pain, redness and swelling  EXAM: RIGHT GREAT TOE  COMPARISON:  07/17/2007  FINDINGS: There is hallux valgus deformity. There is degenerative arthritis of the MTP joint. There is no evidence of fracture. No erosions. Soft tissues adjacent to the MTP joint appears somewhat swollen. No other focal finding.  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 Possible regional soft tissue swelling.   Electronically Signed   By: Nelson Chimes M.D.   On: 05/03/2013 16:42    EKG Interpretation   None       MDM   Final diagnoses:  Cellulitis of toe of right foot    5:45pm- I spoke with Dr Alain Marion about patient and the best way to evaluate and treat her symptoms. Tenosynovitis is very rare in in the foot and typically extends towards the dorsum of the foot. This is inconsistent with  Pt finding. She has no ulcers or open wounds. No obvious abscess by external visualization or palpation. At this point it appears to be a bad cellulitis. He recommends post-op boot and 1 Dose of IV abx before DC. He recommends she follow-up with Dr. Sharol Given. For this. I prefer she be seen before the weekend for wound recheck since tendon infections happen quick.  Pain medication, fluids  and 1 dose of IV clinda given in the ED as well as an Rx for the same.    Her labs are reassuring and her xray of the foot is as well.   68 y.o.Debra [name]'s evaluation in the Emergency Department is complete. It has been determined that no acute conditions requiring further emergency intervention are present at this time. The patient/guardian have been advised of the diagnosis and plan. We have discussed signs and symptoms that warrant return to the ED, such as changes or worsening in symptoms.  Vital signs are stable at discharge. Filed Vitals:   05/03/13 1340  BP: 141/73  Pulse: 82  Temp: 99.8 F (37.7 C)  Resp: 20    Patient/guardian has voiced understanding and agreed to follow-up with the PCP or specialist.    Linus Mako, PA-C 05/03/13 1802

## 2013-05-03 NOTE — ED Notes (Signed)
Pt c/o foot/leg swelling and burning since yesterday.  Redness and swelling noted to rt foot.

## 2013-05-03 NOTE — ED Provider Notes (Signed)
History/physical exam/procedure(s) were performed by non-physician practitioner and as supervising physician I was immediately available for consultation/collaboration. I have reviewed all notes and am in agreement with care and plan.   Shaune Pollack, MD 05/03/13 2329

## 2013-05-03 NOTE — Discharge Instructions (Signed)
Cellulitis Cellulitis is an infection of the skin and the tissue beneath it. The infected area is usually red and tender. Cellulitis occurs most often in the arms and lower legs.  CAUSES  Cellulitis is caused by bacteria that enter the skin through cracks or cuts in the skin. The most common types of bacteria that cause cellulitis are Staphylococcus and Streptococcus. SYMPTOMS   Redness and warmth.  Swelling.  Tenderness or pain.  Fever. DIAGNOSIS  Your caregiver can usually determine what is wrong based on a physical exam. Blood tests may also be done. TREATMENT  Treatment usually involves taking an antibiotic medicine. HOME CARE INSTRUCTIONS   Take your antibiotics as directed. Finish them even if you start to feel better.  Keep the infected arm or leg elevated to reduce swelling.  Apply a warm cloth to the affected area up to 4 times per day to relieve pain.  Only take over-the-counter or prescription medicines for pain, discomfort, or fever as directed by your caregiver.  Keep all follow-up appointments as directed by your caregiver. SEEK MEDICAL CARE IF:   You notice red streaks coming from the infected area.  Your red area gets larger or turns dark in color.  Your bone or joint underneath the infected area becomes painful after the skin has healed.  Your infection returns in the same area or another area.  You notice a swollen bump in the infected area.  You develop new symptoms. SEEK IMMEDIATE MEDICAL CARE IF:   You have a fever.  You feel very sleepy.  You develop vomiting or diarrhea.  You have a general ill feeling (malaise) with muscle aches and pains. MAKE SURE YOU:   Understand these instructions.  Will watch your condition.  Will get help right away if you are not doing well or get worse. Document Released: 12/17/2004 Document Revised: 09/08/2011 Document Reviewed: 05/25/2011 [employer] Patient Information 2014 [employer].  

## 2013-05-03 NOTE — Telephone Encounter (Signed)
Patient called frantic and crying and stated that she could not walk and could only drag her leg. Stated that it was swollen and painful and can do nothing but drag it. Informed her to call 911 to be evaluated. She Agreed.

## 2013-05-08 ENCOUNTER — Emergency Department (HOSPITAL_COMMUNITY)
Admission: EM | Admit: 2013-05-08 | Discharge: 2013-05-08 | Disposition: A | Discharge status: Home / Self Care | Payer: Medicare HMO | Attending: Emergency Medicine | Admitting: Emergency Medicine

## 2013-05-08 ENCOUNTER — Emergency Department (HOSPITAL_COMMUNITY): Payer: Medicare HMO

## 2013-05-08 ENCOUNTER — Encounter (HOSPITAL_COMMUNITY): Payer: Self-pay | Admitting: Emergency Medicine

## 2013-05-08 DIAGNOSIS — R197 Diarrhea, unspecified: Secondary | ICD-10-CM | POA: Insufficient documentation

## 2013-05-08 DIAGNOSIS — Z91040 Latex allergy status: Secondary | ICD-10-CM | POA: Insufficient documentation

## 2013-05-08 DIAGNOSIS — R519 Headache, unspecified: Secondary | ICD-10-CM

## 2013-05-08 DIAGNOSIS — R61 Generalized hyperhidrosis: Secondary | ICD-10-CM | POA: Insufficient documentation

## 2013-05-08 DIAGNOSIS — Z8739 Personal history of other diseases of the musculoskeletal system and connective tissue: Secondary | ICD-10-CM | POA: Insufficient documentation

## 2013-05-08 DIAGNOSIS — I10 Essential (primary) hypertension: Secondary | ICD-10-CM | POA: Insufficient documentation

## 2013-05-08 DIAGNOSIS — R51 Headache: Secondary | ICD-10-CM | POA: Insufficient documentation

## 2013-05-08 DIAGNOSIS — Z79899 Other long term (current) drug therapy: Secondary | ICD-10-CM | POA: Insufficient documentation

## 2013-05-08 DIAGNOSIS — Z8719 Personal history of other diseases of the digestive system: Secondary | ICD-10-CM | POA: Insufficient documentation

## 2013-05-08 DIAGNOSIS — R5383 Other fatigue: Secondary | ICD-10-CM

## 2013-05-08 DIAGNOSIS — R05 Cough: Secondary | ICD-10-CM | POA: Insufficient documentation

## 2013-05-08 DIAGNOSIS — Z880 Allergy status to penicillin: Secondary | ICD-10-CM | POA: Insufficient documentation

## 2013-05-08 DIAGNOSIS — R5381 Other malaise: Secondary | ICD-10-CM | POA: Insufficient documentation

## 2013-05-08 DIAGNOSIS — E1149 Type 2 diabetes mellitus with other diabetic neurological complication: Secondary | ICD-10-CM | POA: Insufficient documentation

## 2013-05-08 DIAGNOSIS — R059 Cough, unspecified: Secondary | ICD-10-CM | POA: Insufficient documentation

## 2013-05-08 DIAGNOSIS — R112 Nausea with vomiting, unspecified: Secondary | ICD-10-CM | POA: Insufficient documentation

## 2013-05-08 DIAGNOSIS — E785 Hyperlipidemia, unspecified: Secondary | ICD-10-CM | POA: Insufficient documentation

## 2013-05-08 DIAGNOSIS — Z872 Personal history of diseases of the skin and subcutaneous tissue: Secondary | ICD-10-CM | POA: Insufficient documentation

## 2013-05-08 DIAGNOSIS — K521 Toxic gastroenteritis and colitis: Secondary | ICD-10-CM

## 2013-05-08 LAB — CBC WITH DIFFERENTIAL/PLATELET
BASOS PCT: 1 % (ref 0–1)
Basophils Absolute: 0 10*3/uL (ref 0.0–0.1)
Eosinophils Absolute: 0.3 10*3/uL (ref 0.0–0.7)
Eosinophils Relative: 5 % (ref 0–5)
HEMATOCRIT: 36.9 % (ref 36.0–46.0)
Hemoglobin: 12.2 g/dL (ref 12.0–15.0)
Lymphocytes Relative: 31 % (ref 12–46)
Lymphs Abs: 1.9 10*3/uL (ref 0.7–4.0)
MCH: 30.3 pg (ref 26.0–34.0)
MCHC: 33.1 g/dL (ref 30.0–36.0)
MCV: 91.6 fL (ref 78.0–100.0)
MONO ABS: 0.8 10*3/uL (ref 0.1–1.0)
Monocytes Relative: 13 % — ABNORMAL HIGH (ref 3–12)
NEUTROS PCT: 50 % (ref 43–77)
Neutro Abs: 3.1 10*3/uL (ref 1.7–7.7)
Platelets: 251 10*3/uL (ref 150–400)
RBC: 4.03 MIL/uL (ref 3.87–5.11)
RDW: 13.5 % (ref 11.5–15.5)
WBC: 6.2 10*3/uL (ref 4.0–10.5)

## 2013-05-08 LAB — POCT I-STAT, CHEM 8
BUN: 24 mg/dL — ABNORMAL HIGH (ref 6–23)
CALCIUM ION: 1.14 mmol/L (ref 1.13–1.30)
Chloride: 99 mEq/L (ref 96–112)
Creatinine, Ser: 1.2 mg/dL — ABNORMAL HIGH (ref 0.50–1.10)
GLUCOSE: 179 mg/dL — AB (ref 70–99)
HEMATOCRIT: 40 % (ref 36.0–46.0)
HEMOGLOBIN: 13.6 g/dL (ref 12.0–15.0)
Potassium: 3.5 mEq/L — ABNORMAL LOW (ref 3.7–5.3)
Sodium: 142 mEq/L (ref 137–147)
TCO2: 27 mmol/L (ref 0–100)

## 2013-05-08 MED ORDER — ONDANSETRON 4 MG PO TBDP: 4.0000 mg | ORAL_TABLET | Freq: Three times a day (TID) | ORAL | Status: DC | PRN

## 2013-05-08 MED ORDER — ONDANSETRON HCL 4 MG/2ML IJ SOLN
4.0000 mg | Freq: Once | INTRAMUSCULAR | Status: AC
  Administered 2013-05-08: 4 mg via INTRAVENOUS
  Filled 2013-05-08: qty 2

## 2013-05-08 MED ORDER — SODIUM CHLORIDE 0.9 % IV SOLN
Freq: Once | INTRAVENOUS | Status: AC
  Administered 2013-05-08: 04:00:00 via INTRAVENOUS

## 2013-05-08 MED ORDER — ACETAMINOPHEN 325 MG PO TABS
650.0000 mg | ORAL_TABLET | Freq: Once | ORAL | Status: AC
  Administered 2013-05-08: 650 mg via ORAL
  Filled 2013-05-08: qty 2

## 2013-05-08 NOTE — ED Provider Notes (Signed)
Medical screening examination/treatment/procedure(s) were performed by non-physician practitioner and as supervising physician I was immediately available for consultation/collaboration.  EKG Interpretation   None         Julianne Rice, MD 05/08/13 [phone]

## 2013-05-08 NOTE — Discharge Instructions (Signed)
Stop taking the colchicine, please call Dr. Mariea Clonts for further advice on the gout treatment

## 2013-05-08 NOTE — ED Provider Notes (Signed)
CSN: [id number]     Arrival date & time 05/08/13  0219 History   First MD Initiated Contact with Patient 05/08/13 0239     Chief Complaint  Patient presents with  . Nausea  . Cough     (Consider location/radiation/quality/duration/timing/severity/associated sxs/prior Treatment) HPI Comments: Currently being treated for gout in the R great toe which is getting better but is having increase in normal diarrhea, nausea and vomiting   Has an occasional cough, no CP or SOB, fever but reports diaphoresis  Patient is a 68 y.o. female presenting with cough. The history is provided by the patient.  Cough Cough characteristics:  Non-productive Severity:  Mild Onset quality:  Unable to specify Timing:  Intermittent Progression:  Worsening Chronicity:  New Context: not upper respiratory infection   Relieved by:  Nothing Worsened by:  Activity Ineffective treatments:  None tried Associated symptoms: myalgias   Associated symptoms: no chest pain, no diaphoresis, no ear fullness, no ear pain, no eye discharge, no fever, no headaches, no rash, no rhinorrhea, no shortness of breath, no sinus congestion and no sore throat     Past Medical History  Diagnosis Date  . Diabetes mellitus with neurological manifestation   . Hypertension goal BP (blood pressure) < 130/80   . Hyperlipidemia LDL goal < 100   . Osteopenia     DEXA scan 7/07  . Chronic venous insufficiency   . Morbid obesity   . Diverticulitis     h/o 2-3 episodes in past  . Leg edema     secondary to chronic venous insufficiency  . Cellulitis of left leg     history of, most recent episode 01/09   Past Surgical History  Procedure Laterality Date  . Appendectomy    . Abdominal hysterectomy    . Colectomy      with diverting colsotmy and revision in the 1990's for diverticulitis  . Eye surgery  8/13    left cataract removal & retinal repair  . Cholecystectomy    . Cholecystectomy N/A 04/14/2013    Procedure: LAPAROSCOPIC  CHOLECYSTECTOMY WITH INTRAOPERATIVE CHOLANGIOGRAM;  Surgeon: Adin Hector, MD;  Location: [facility];  Service: General;  Laterality: N/A;  . Laparoscopic lysis of adhesions N/A 04/14/2013    Procedure: LAPAROSCOPIC LYSIS OF ADHESIONS;  Surgeon: Adin Hector, MD;  Location: [facility];  Service: General;  Laterality: N/A;  . Ercp N/A 04/17/2013    Procedure: ENDOSCOPIC RETROGRADE CHOLANGIOPANCREATOGRAPHY (ERCP);  Surgeon: Irene Shipper, MD;  Location: [facility];  Service: Endoscopy;  Laterality: N/A;  note pt want general anesthesia for this case.  preferto perform in endo unit, but if unavailable please arrange time in OR   Family History  Problem Relation Age of Onset  . Heart disease Mother   . Heart disease Father   . Heart disease Brother   . Cancer Brother   . Cancer Brother   . Hypertension Sister   . Heart disease Sister    History  Substance Use Topics  . Smoking status: Never Smoker   . Smokeless tobacco: Never Used  . Alcohol Use: No   OB History   Grav Para Term Preterm Abortions TAB SAB Ect Mult Living                 Review of Systems  Unable to perform ROS Constitutional: Negative for fever and diaphoresis.  HENT: Negative for congestion, ear pain, rhinorrhea and sore throat.   Eyes: Negative for discharge.  Respiratory: Positive  for cough. Negative for shortness of breath.   Cardiovascular: Negative for chest pain.  Gastrointestinal: Positive for nausea, vomiting and diarrhea.  Genitourinary: Negative for dysuria.  Musculoskeletal: Positive for myalgias.  Skin: Positive for color change. Negative for rash and wound.  Neurological: Positive for weakness. Negative for dizziness and headaches.  All other systems reviewed and are negative.      Allergies  Aspirin; Banana; Hydrocodone; Neomycin-polymyxin-gramicidin; Penicillins; Tramadol; and Latex  Home Medications   Current Outpatient Rx  Name  Route  Sig  Dispense  Refill  . Alogliptin-Pioglitazone  (OSENI) 12.5-30 MG TABS   Oral   Take 1 tablet by mouth daily.         Marland Kitchen atorvastatin (LIPITOR) 20 MG tablet   Oral   Take 1 tablet (20 mg total) by mouth daily.   90 tablet   3   . Capsaicin-Menthol-Methyl Sal (CAPSAICIN-METHYL SAL-MENTHOL) 0.025-1-12 % CREA   Apply externally   Apply 1 Tube topically 4 (four) times daily.   1 Tube   2   . clindamycin (CLEOCIN) 150 MG capsule   Oral   Take 1 capsule (150 mg total) by mouth every 6 (six) hours.   28 capsule   0   . diphenhydrAMINE (BENADRYL) 25 MG tablet   Oral   Take 2 tablets (50 mg total) by mouth every 6 (six) hours as needed for itching.         . hydrochlorothiazide (HYDRODIURIL) 25 MG tablet   Oral   Take 25 mg by mouth daily.         Marland Kitchen HYDROcodone-acetaminophen (NORCO/VICODIN) 5-325 MG per tablet   Oral   Take 1-2 tablets by mouth every 4 (four) hours as needed.   10 tablet   0   . ibuprofen (ADVIL,MOTRIN) 200 MG tablet   Oral   Take 400 mg by mouth every 6 (six) hours as needed for pain. For pain         . lisinopril (PRINIVIL,ZESTRIL) 40 MG tablet   Oral   Take 40 mg by mouth daily.         . metFORMIN (GLUCOPHAGE) 1000 MG tablet   Oral   Take 1,000 mg by mouth 2 (two) times daily with a meal.         . metoprolol succinate (TOPROL-XL) 25 MG 24 hr tablet   Oral   Take 25 mg by mouth daily.         Marland Kitchen omeprazole (PRILOSEC) 20 MG capsule   Oral   Take 1 capsule (20 mg total) by mouth daily.   30 capsule   3   . pioglitazone (ACTOS) 30 MG tablet   Oral   Take 30 mg by mouth every morning.         . pregabalin (LYRICA) 75 MG capsule   Oral   Take 1 capsule (75 mg total) by mouth 3 (three) times daily.   90 capsule   2   . ondansetron (ZOFRAN ODT) 4 MG disintegrating tablet   Oral   Take 1 tablet (4 mg total) by mouth every 8 (eight) hours as needed for nausea or vomiting.   20 tablet   0    BP 111/66  Pulse 89  Temp(Src) 98.2 F (36.8 C) (Oral)  Resp 16  SpO2 93%   LMP 07/02/1973 Physical Exam  Nursing note and vitals reviewed. Constitutional: She is oriented to person, place, and time. She appears well-developed and well-nourished.  obese  HENT:  Head: Normocephalic.  Eyes: Pupils are equal, round, and reactive to light.  Neck: Normal range of motion.  Cardiovascular: Normal rate and regular rhythm.   Pulmonary/Chest: Effort normal and breath sounds normal.  Abdominal: Soft. Bowel sounds are normal. She exhibits no distension. There is no tenderness.  Musculoskeletal: Normal range of motion. She exhibits no tenderness.  Neurological: She is alert and oriented to person, place, and time.  Skin: Skin is warm and dry.    ED Course  Procedures (including critical care time) Labs Review Labs Reviewed  CBC WITH DIFFERENTIAL - Abnormal; Notable for the following:    Monocytes Relative 13 (*)    All other components within normal limits  POCT I-STAT, CHEM 8 - Abnormal; Notable for the following:    Potassium 3.5 (*)    BUN 24 (*)    Creatinine, Ser 1.20 (*)    Glucose, Bld 179 (*)    All other components within normal limits   Imaging Review Dg Chest 2 View  05/08/2013   CLINICAL DATA:  Cough, congestion, shortness of breath.  EXAM: CHEST  2 VIEW  COMPARISON:  03/11/2008  FINDINGS: Linear bibasilar atelectasis. Heart is normal size. There are low lung volumes. No effusions. No acute bony abnormality.  IMPRESSION: Low lung volumes, bibasilar atelectasis.   Electronically Signed   By: Rolm Baptise M.D.   On: 05/08/2013 05:33    EKG Interpretation   None       MDM   Final diagnoses:  Diarrhea due to drug  Headache     Feeling better  But with headache  Will give Tylenol and DC home     Garald Balding, NP 05/08/13 [phone]

## 2013-05-08 NOTE — ED Notes (Signed)
Patient is alert and oriented x3.  She was given DC instructions and follow up visit instructions.  Patient gave verbal understanding. She was DC ambulatory under her own power to home.  V/S stable.  He was not showing any signs of distress on DC 

## 2013-05-08 NOTE — ED Notes (Signed)
Pt arrived to the ED with a complaint of nausea, cough and episodes of emesis.  Pt has had these symptoms for several days.  Pt has seen her PCP and given medication without relief of symptoms.  Pt is also complaining of toe pain.  Pt also states that eyes are burning.  Pt has chills and hot flashes.

## 2013-05-09 ENCOUNTER — Other Ambulatory Visit: Payer: Self-pay

## 2013-05-15 ENCOUNTER — Ambulatory Visit: Payer: Self-pay | Admitting: Internal Medicine

## 2013-05-16 ENCOUNTER — Encounter (INDEPENDENT_AMBULATORY_CARE_PROVIDER_SITE_OTHER): Payer: Medicare HMO

## 2013-05-22 ENCOUNTER — Other Ambulatory Visit: Payer: Self-pay | Admitting: Nurse Practitioner

## 2013-06-02 ENCOUNTER — Ambulatory Visit (INDEPENDENT_AMBULATORY_CARE_PROVIDER_SITE_OTHER): Payer: Medicare HMO | Admitting: Internal Medicine

## 2013-06-02 ENCOUNTER — Encounter: Payer: Self-pay | Admitting: Internal Medicine

## 2013-06-02 VITALS — BP 144/86 | HR 66 | Temp 97.8°F | Wt 269.2 lb

## 2013-06-02 DIAGNOSIS — S80811A Abrasion, right lower leg, initial encounter: Secondary | ICD-10-CM

## 2013-06-02 DIAGNOSIS — IMO0002 Reserved for concepts with insufficient information to code with codable children: Secondary | ICD-10-CM

## 2013-06-02 NOTE — Patient Instructions (Signed)
Let me know if you develop pain in the leg, fever, increased discolored drainage from the leg before I see you next week.  For now, wash it daily with antibacterial soap. Apply neosporin cream. Then apply telfa nonadherent dressings I gave you Wrap with the ace bandage to hold in place.    See you next week!

## 2013-06-02 NOTE — Progress Notes (Signed)
Patient ID: Debra [name], female   DOB: June 17, 1945, 68 y.o.   MRN: [id number]   Location:  [facility]   Allergies  Allergen Reactions  . Aspirin Nausea Only    And causes bruises  . Banana Hives  . Hydrocodone Hives  . Neomycin-Polymyxin-Gramicidin Itching and Swelling    Eye drops caused swelling in face and rash and itching on arms and neck  . Penicillins Hives and Itching  . Tramadol Nausea And Vomiting  . Latex Hives and Rash    Chief Complaint  Patient presents with  . Acute Visit    hit RT leg on shower x 2 days ago, (bleeding/ red)    HPI: Patient is a 68 y.o. white female seen in the office today for acute visit due to right leg injury with swelling, bleeding, redness.  She has also been to the ED on 2 occasions one month ago.  Had gout in toe of right foot.  Was treated for that.  Also notes indicate cellulitis.    Now she was bathing and her right leg had a small bubble on the shin, she cleaned it and the skin came right off--is excoriated, burning and red.    Review of Systems:  Review of Systems  Constitutional: Positive for weight loss. Negative for fever and malaise/fatigue.       Appetite still not as good  Eyes: Negative for blurred vision.  Respiratory: Negative for cough and shortness of breath.   Cardiovascular: Positive for leg swelling. Negative for chest pain.       Ate potato chips  Gastrointestinal: Negative for abdominal pain.       Better since chole  Genitourinary: Negative for dysuria.  Musculoskeletal: Negative for falls and myalgias.  Neurological: Negative for dizziness, weakness and headaches.  Psychiatric/Behavioral: Negative for memory loss.     Past Medical History  Diagnosis Date  . Diabetes mellitus with neurological manifestation   . Hypertension goal BP (blood pressure) < 130/80   . Hyperlipidemia LDL goal < 100   . Osteopenia     DEXA scan 7/07  . Chronic venous insufficiency   .  Morbid obesity   . Diverticulitis     h/o 2-3 episodes in past  . Leg edema     secondary to chronic venous insufficiency  . Cellulitis of left leg     history of, most recent episode 01/09    Past Surgical History  Procedure Laterality Date  . Appendectomy    . Abdominal hysterectomy    . Colectomy      with diverting colsotmy and revision in the 1990's for diverticulitis  . Eye surgery  8/13    left cataract removal & retinal repair  . Cholecystectomy    . Cholecystectomy N/A 04/14/2013    Procedure: LAPAROSCOPIC CHOLECYSTECTOMY WITH INTRAOPERATIVE CHOLANGIOGRAM;  Surgeon: Adin Hector, MD;  Location: [facility];  Service: General;  Laterality: N/A;  . Laparoscopic lysis of adhesions N/A 04/14/2013    Procedure: LAPAROSCOPIC LYSIS OF ADHESIONS;  Surgeon: Adin Hector, MD;  Location: [facility];  Service: General;  Laterality: N/A;  . Ercp N/A 04/17/2013    Procedure: ENDOSCOPIC RETROGRADE CHOLANGIOPANCREATOGRAPHY (ERCP);  Surgeon: Irene Shipper, MD;  Location: [facility];  Service: Endoscopy;  Laterality: N/A;  note pt want general anesthesia for this case.  preferto perform in endo unit, but if unavailable please arrange time in OR    Social History:   reports  that she has never smoked. She has never used smokeless tobacco. She reports that she does not drink alcohol or use illicit drugs.  Family History  Problem Relation Age of Onset  . Heart disease Mother   . Heart disease Father   . Heart disease Brother   . Cancer Brother   . Cancer Brother   . Hypertension Sister   . Heart disease Sister     Medications: Patient's Medications  New Prescriptions   No medications on file  Previous Medications   ALOGLIPTIN-PIOGLITAZONE (OSENI) 12.5-30 MG TABS    Take 1 tablet by mouth daily.   ATORVASTATIN (LIPITOR) 20 MG TABLET    Take 1 tablet (20 mg total) by mouth daily.   CAPSAICIN-MENTHOL-METHYL SAL (CAPSAICIN-METHYL SAL-MENTHOL) 0.025-1-12 % CREA    Apply 1 Tube topically 4  (four) times daily.   CLINDAMYCIN (CLEOCIN) 150 MG CAPSULE    Take 1 capsule (150 mg total) by mouth every 6 (six) hours.   DIPHENHYDRAMINE (BENADRYL) 25 MG TABLET    Take 2 tablets (50 mg total) by mouth every 6 (six) hours as needed for itching.   HYDROCHLOROTHIAZIDE (HYDRODIURIL) 25 MG TABLET    Take 25 mg by mouth daily.   HYDROCODONE-ACETAMINOPHEN (NORCO/VICODIN) 5-325 MG PER TABLET    Take 1-2 tablets by mouth every 4 (four) hours as needed.   IBUPROFEN (ADVIL,MOTRIN) 200 MG TABLET    Take 400 mg by mouth every 6 (six) hours as needed for pain. For pain   LISINOPRIL (PRINIVIL,ZESTRIL) 40 MG TABLET    Take 40 mg by mouth daily.   METFORMIN (GLUCOPHAGE) 1000 MG TABLET    Take 1,000 mg by mouth 2 (two) times daily with a meal.   METOPROLOL SUCCINATE (TOPROL-XL) 25 MG 24 HR TABLET    Take 25 mg by mouth daily.   OMEPRAZOLE (PRILOSEC) 20 MG CAPSULE    TAKE ONE CAPSULE BY MOUTH DAILY   ONDANSETRON (ZOFRAN ODT) 4 MG DISINTEGRATING TABLET    Take 1 tablet (4 mg total) by mouth every 8 (eight) hours as needed for nausea or vomiting.   PIOGLITAZONE (ACTOS) 30 MG TABLET    Take 30 mg by mouth every morning.   PREGABALIN (LYRICA) 75 MG CAPSULE    Take 1 capsule (75 mg total) by mouth 3 (three) times daily.  Modified Medications   No medications on file  Discontinued Medications   No medications on file     Physical Exam: Filed Vitals:   06/02/13 1236  BP: 144/86  Pulse: 66  Temp: 97.8 F (36.6 C)  TempSrc: Oral  Weight: 269 lb 3.2 oz (122.108 kg)  SpO2: 98%   Physical Exam  Constitutional: She is oriented to person, place, and time. She appears well-developed and well-nourished. No distress.  Cardiovascular: Normal rate, regular rhythm, normal heart sounds and intact distal pulses.   Pulmonary/Chest: Effort normal and breath sounds normal. No respiratory distress.  Abdominal: Soft. Bowel sounds are normal.  Neurological: She is alert and oriented to person, place, and time.  Skin:    Right leg with just under 3x4 area of excoriation (superficial epidermis missing)--has redness, clear-yellow drainage, only minimal erythema, no warmth;  Has bilateral 2+ pitting edema  Psychiatric: She has a normal mood and affect.     Labs reviewed: Basic Metabolic Panel:  Recent Labs  11/28/12 1054  04/14/13 0524  04/19/13 0442 04/26/13 1133 05/03/13 1622 05/08/13 0359  NA 147*  < > 136*  < > 135* 141 138 142  K 4.6  < > 3.9  < > 3.7 4.5 4.5 3.5*  CL 102  < > 97  < > 93* 96* 96 99  CO2 25  < > 24  < > 30 22 27   --   GLUCOSE 129*  < > 224*  < > 190* 162* 177* 179*  BUN 18  < > 14  < > 11 21 20  24*  CREATININE 0.91  < > 0.71  < > 0.72 0.83 0.87 1.20*  CALCIUM 9.9  < > 9.0  < > 8.7 9.8 9.8  --   MG  --   --  1.2*  --  1.5  --   --   --   TSH 3.630  --   --   --   --   --   --   --   < > = values in this interval not displayed. Liver Function Tests:  Recent Labs  04/15/13 0526 04/18/13 0531 04/19/13 0442 04/26/13 1133  AST 294* 130* 164* 21  ALT 518* 243* 244* 41*  ALKPHOS 152* 176* 194* 139*  BILITOT 1.0 5.3* 4.4* 0.9  PROT 6.5 6.4 6.1 6.8  ALBUMIN 3.2* 2.6* 2.5*  --     Recent Labs  01/19/13 1422  04/15/13 0526 04/18/13 0531 04/19/13 0442 04/26/13 1133  LIPASE 24  < >  --  497* 122* 84*  AMYLASE 52  --  43  --   --  58  < > = values in this interval not displayed. No results found for this basename: AMMONIA,  in the last 8760 hours CBC:  Recent Labs  04/26/13 1133 05/03/13 1622 05/08/13 0324 05/08/13 0359  WBC 8.9 10.2 6.2  --   NEUTROABS 5.6 6.9 3.1  --   HGB 13.2 13.4 12.2 13.6  HCT 39.3 40.2 36.9 40.0  MCV 91 92.8 91.6  --   PLT 449* 316 251  --    Lipid Panel: No results found for this basename: CHOL, HDL, LDLCALC, TRIG, CHOLHDL, LDLDIRECT,  in the last 8760 hours Lab Results  Component Value Date   HGBA1C 7.0* 04/13/2013     Assessment/Plan 1. Abrasion of right lower leg -Let me know if you develop pain in the leg, fever, increased  discolored drainage from the leg before I see you next week.  For now, wash it daily with antibacterial soap. Apply neosporin cream. Then apply telfa nonadherent dressings I gave you Wrap with the ace bandage to hold in place.     Labs/tests ordered: none Next appt:keep f/u next week

## 2013-06-07 ENCOUNTER — Ambulatory Visit: Payer: Medicare HMO | Admitting: *Deleted

## 2013-06-09 ENCOUNTER — Encounter: Payer: Self-pay | Admitting: Internal Medicine

## 2013-06-09 ENCOUNTER — Ambulatory Visit (INDEPENDENT_AMBULATORY_CARE_PROVIDER_SITE_OTHER): Payer: Commercial Managed Care - HMO | Admitting: Internal Medicine

## 2013-06-09 VITALS — BP 134/80 | HR 81 | Temp 99.6°F | Wt 274.0 lb

## 2013-06-09 DIAGNOSIS — G609 Hereditary and idiopathic neuropathy, unspecified: Secondary | ICD-10-CM

## 2013-06-09 DIAGNOSIS — IMO0002 Reserved for concepts with insufficient information to code with codable children: Secondary | ICD-10-CM

## 2013-06-09 DIAGNOSIS — R609 Edema, unspecified: Secondary | ICD-10-CM

## 2013-06-09 DIAGNOSIS — I872 Venous insufficiency (chronic) (peripheral): Secondary | ICD-10-CM

## 2013-06-09 DIAGNOSIS — M10472 Other secondary gout, left ankle and foot: Secondary | ICD-10-CM

## 2013-06-09 DIAGNOSIS — E1349 Other specified diabetes mellitus with other diabetic neurological complication: Secondary | ICD-10-CM

## 2013-06-09 DIAGNOSIS — E785 Hyperlipidemia, unspecified: Secondary | ICD-10-CM

## 2013-06-09 DIAGNOSIS — E1142 Type 2 diabetes mellitus with diabetic polyneuropathy: Secondary | ICD-10-CM

## 2013-06-09 DIAGNOSIS — M109 Gout, unspecified: Secondary | ICD-10-CM

## 2013-06-09 DIAGNOSIS — G5793 Unspecified mononeuropathy of bilateral lower limbs: Secondary | ICD-10-CM

## 2013-06-09 DIAGNOSIS — S80811A Abrasion, right lower leg, initial encounter: Secondary | ICD-10-CM

## 2013-06-09 DIAGNOSIS — E0840 Diabetes mellitus due to underlying condition with diabetic neuropathy, unspecified: Secondary | ICD-10-CM

## 2013-06-09 MED ORDER — POTASSIUM CHLORIDE CRYS ER 20 MEQ PO TBCR: 20.0000 meq | EXTENDED_RELEASE_TABLET | Freq: Every day | ORAL | Status: DC

## 2013-06-09 MED ORDER — LINAGLIPTIN 5 MG PO TABS: 5.0000 mg | ORAL_TABLET | Freq: Every day | ORAL | Status: DC

## 2013-06-09 MED ORDER — FUROSEMIDE 20 MG PO TABS: 20.0000 mg | ORAL_TABLET | Freq: Every day | ORAL | Status: DC

## 2013-06-09 NOTE — Progress Notes (Signed)
Patient ID: Debra [name], female   DOB: 11-17-1945, 68 y.o.   MRN: [id number]   Location:  [facility]  Code Status: full code   Allergies  Allergen Reactions  . Aspirin Nausea Only    And causes bruises  . Banana Hives  . Hydrocodone Hives  . Neomycin-Polymyxin-Gramicidin Itching and Swelling    Eye drops caused swelling in face and rash and itching on arms and neck  . Penicillins Hives and Itching  . Tramadol Nausea And Vomiting  . Latex Hives and Rash    Chief Complaint  Patient presents with  . Follow-up    6 week follow-up   . Leg Injury    F/U on right leg, skin is irritated and raw   . Foot Swelling    patient with Gout bilateral, patient with increased swelling     HPI: Patient is a 68 y.o. white female seen in the office today for 6 week f/u, also f/u on left leg injury and bilateral foot swelling.   Both great toes are incredibly red and swollen.  Resumed allopurinol, but did not tolerate the colcrys--vomited and had diarrhea from it badly.  Also has not tolerated tramadol  Has never had cardiac echo.  Is having increased edema of bilateral legs and has gained weight 5 lbs since last visit just a week ago.  Is on actos.    Has not used ibuprofen in 2 days.    Review of Systems:  ROS   Past Medical History  Diagnosis Date  . Diabetes mellitus with neurological manifestation   . Hypertension goal BP (blood pressure) < 130/80   . Hyperlipidemia LDL goal < 100   . Osteopenia     DEXA scan 7/07  . Chronic venous insufficiency   . Morbid obesity   . Diverticulitis     h/o 2-3 episodes in past  . Leg edema     secondary to chronic venous insufficiency  . Cellulitis of left leg     history of, most recent episode 01/09    Past Surgical History  Procedure Laterality Date  . Appendectomy    . Abdominal hysterectomy    . Colectomy      with diverting colsotmy and revision in the 1990's for diverticulitis  . Eye  surgery  8/13    left cataract removal & retinal repair  . Cholecystectomy    . Cholecystectomy N/A 04/14/2013    Procedure: LAPAROSCOPIC CHOLECYSTECTOMY WITH INTRAOPERATIVE CHOLANGIOGRAM;  Surgeon: Adin Hector, MD;  Location: [facility];  Service: General;  Laterality: N/A;  . Laparoscopic lysis of adhesions N/A 04/14/2013    Procedure: LAPAROSCOPIC LYSIS OF ADHESIONS;  Surgeon: Adin Hector, MD;  Location: [facility];  Service: General;  Laterality: N/A;  . Ercp N/A 04/17/2013    Procedure: ENDOSCOPIC RETROGRADE CHOLANGIOPANCREATOGRAPHY (ERCP);  Surgeon: Irene Shipper, MD;  Location: [facility];  Service: Endoscopy;  Laterality: N/A;  note pt want general anesthesia for this case.  preferto perform in endo unit, but if unavailable please arrange time in OR    Social History:   reports that she has never smoked. She has never used smokeless tobacco. She reports that she does not drink alcohol or use illicit drugs.  Family History  Problem Relation Age of Onset  . Heart disease Mother   . Heart disease Father   . Heart disease Brother   . Cancer Brother   . Cancer Brother   .  Hypertension Sister   . Heart disease Sister     Medications: Patient's Medications  New Prescriptions   No medications on file  Previous Medications   ALLOPURINOL (ZYLOPRIM) 100 MG TABLET    Take 100 mg by mouth 2 (two) times daily. For Gout   ALOGLIPTIN-PIOGLITAZONE (OSENI) 12.5-30 MG TABS    Take 1 tablet by mouth daily.   ATORVASTATIN (LIPITOR) 20 MG TABLET    Take 1 tablet (20 mg total) by mouth daily.   CAPSAICIN-MENTHOL-METHYL SAL (CAPSAICIN-METHYL SAL-MENTHOL) 0.025-1-12 % CREA    Apply 1 Tube topically 4 (four) times daily.   CLINDAMYCIN (CLEOCIN) 150 MG CAPSULE    Take 1 capsule (150 mg total) by mouth every 6 (six) hours.   DIPHENHYDRAMINE (BENADRYL) 25 MG TABLET    Take 2 tablets (50 mg total) by mouth every 6 (six) hours as needed for itching.   HYDROCHLOROTHIAZIDE (HYDRODIURIL) 25 MG TABLET     Take 25 mg by mouth daily.   HYDROCODONE-ACETAMINOPHEN (NORCO/VICODIN) 5-325 MG PER TABLET    Take 1-2 tablets by mouth every 4 (four) hours as needed.   IBUPROFEN (ADVIL,MOTRIN) 200 MG TABLET    Take 400 mg by mouth every 6 (six) hours as needed for pain. For pain   LISINOPRIL (PRINIVIL,ZESTRIL) 40 MG TABLET    Take 40 mg by mouth daily.   METFORMIN (GLUCOPHAGE) 1000 MG TABLET    Take 1,000 mg by mouth 2 (two) times daily with a meal.   METOPROLOL SUCCINATE (TOPROL-XL) 25 MG 24 HR TABLET    Take 25 mg by mouth daily.   OMEPRAZOLE (PRILOSEC) 20 MG CAPSULE    TAKE ONE CAPSULE BY MOUTH DAILY   ONDANSETRON (ZOFRAN ODT) 4 MG DISINTEGRATING TABLET    Take 1 tablet (4 mg total) by mouth every 8 (eight) hours as needed for nausea or vomiting.   PIOGLITAZONE (ACTOS) 30 MG TABLET    Take 30 mg by mouth every morning.   PREGABALIN (LYRICA) 75 MG CAPSULE    Take 1 capsule (75 mg total) by mouth 3 (three) times daily.  Modified Medications   No medications on file  Discontinued Medications   No medications on file     Physical Exam: Filed Vitals:   06/09/13 1036  BP: 134/80  Pulse: 81  Temp: 99.6 F (37.6 C)  TempSrc: Oral  Weight: 274 lb (124.286 kg)  SpO2: 97%  Physical Exam   Labs reviewed: Basic Metabolic Panel:  Recent Labs  11/28/12 1054  04/14/13 0524  04/19/13 0442 04/26/13 1133 05/03/13 1622 05/08/13 0359  NA 147*  < > 136*  < > 135* 141 138 142  K 4.6  < > 3.9  < > 3.7 4.5 4.5 3.5*  CL 102  < > 97  < > 93* 96* 96 99  CO2 25  < > 24  < > 30 22 27   --   GLUCOSE 129*  < > 224*  < > 190* 162* 177* 179*  BUN 18  < > 14  < > 11 21 20  24*  CREATININE 0.91  < > 0.71  < > 0.72 0.83 0.87 1.20*  CALCIUM 9.9  < > 9.0  < > 8.7 9.8 9.8  --   MG  --   --  1.2*  --  1.5  --   --   --   TSH 3.630  --   --   --   --   --   --   --   < > =  values in this interval not displayed. Liver Function Tests:  Recent Labs  04/15/13 0526 04/18/13 0531 04/19/13 0442 04/26/13 1133  AST 294*  130* 164* 21  ALT 518* 243* 244* 41*  ALKPHOS 152* 176* 194* 139*  BILITOT 1.0 5.3* 4.4* 0.9  PROT 6.5 6.4 6.1 6.8  ALBUMIN 3.2* 2.6* 2.5*  --     Recent Labs  01/19/13 1422  04/15/13 0526 04/18/13 0531 04/19/13 0442 04/26/13 1133  LIPASE 24  < >  --  497* 122* 84*  AMYLASE 52  --  43  --   --  58  < > = values in this interval not displayed. No results found for this basename: AMMONIA,  in the last 8760 hours CBC:  Recent Labs  04/26/13 1133 05/03/13 1622 05/08/13 0324 05/08/13 0359  WBC 8.9 10.2 6.2  --   NEUTROABS 5.6 6.9 3.1  --   HGB 13.2 13.4 12.2 13.6  HCT 39.3 40.2 36.9 40.0  MCV 91 92.8 91.6  --   PLT 449* 316 251  --    Lab Results  Component Value Date   HGBA1C 7.0* 04/13/2013    Assessment/Plan 1. Diabetes mellitus with neurological manifestation - see where she stands with this -diet reviewed extensively and she seems interested in making some changes  -cont ace, metformin, statin, stop actos and start tradjenta - Basic metabolic panel - Hemoglobin A1c - 2D Echocardiogram without contrast; Future  2. Neuropathic pain of both legs -cont lyrica therapy  3. Hyperlipidemia LDL goal < 100 -cont statin therapy - Basic metabolic panel  4. Chronic venous insufficiency -this is known and evidenced by the appearance of her legs, is to be elevating and wearing compression hose, but I don't think she adheres too well to this - 2D Echocardiogram without contrast; Future  5. Edema - newly worse, considering her uncontrolled diabetes and actos use, will eval cardiac function - 2D Echocardiogram without contrast; Future  6. Abrasion of right lower leg - keep clean and dry, apply abx ointment, let me know if she develops redness, warmth, drainage from it which indicates infection - CBC With differential/Platelet  7. Acute gout - is on her allopurinol daily for prophylaxis, reviewed foods to avoid, will check level - Uric Acid  Labs/tests ordered:     Orders Placed This Encounter  Procedures  . CBC With differential/Platelet  . Basic metabolic panel  . Hemoglobin A1c  . Uric Acid  . 2D Echocardiogram without contrast    Standing Status: Future     Number of Occurrences:      Standing Expiration Date: 06/10/2014    Order Specific Question:  Type of Echo    Answer:  Complete    Order Specific Question:  Reason for Exam    Answer:  increased weight, edema    Order Specific Question:  Where should this test be performed    Answer:  [facility]    Next appt:  1 week

## 2013-06-09 NOTE — Patient Instructions (Addendum)
Take 4 ibuprofen 200mg  up to 4x per day just for 3 days for our acute gout.  Stop hctz.  Avoid food with nitrates like pickles, cabbage, bean salad, preserved meats like hot dogs and sausage.  Gout Gout is when your joints become red, sore, and swell (inflammed). This is caused by the buildup of uric acid crystals in the joints. Uric acid is a chemical that is normally in the blood. If the level of uric acid gets too high in the blood, these crystals form in your joints and tissues. Over time, these crystals can form into masses near the joints and tissues. These masses can destroy bone and cause the bone to look misshapen (deformed). HOME CARE   Do not take aspirin for pain.  Only take medicine as told by your doctor.  Rest the joint as much as you can. When in bed, keep sheets and blankets off painful areas.  Keep the sore joints raised (elevated).  Put warm or cold packs on painful joints. Use of warm or cold packs depends on which works best for you.  Use crutches if the painful joint is in your leg.  Drink enough fluids to keep your pee (urine) clear or pale yellow. Limit alcohol, sugary drinks, and drinks with fructose in them.  Follow your diet instructions. Pay careful attention to how much protein you eat. Include fruits, vegetables, whole grains, and fat-free or low-fat milk products in your daily diet. Talk to your doctor or dietician about the use of coffee, vitamin C, and cherries. These may help lower uric acid levels.  Keep a healthy body weight. GET HELP RIGHT AWAY IF:   You have watery poop (diarrhea), throw up (vomit), or have any side effects from medicines.  You do not feel better in 24 hours, or you are getting worse.  Your joint becomes suddenly more tender, and you have chills or a fever. MAKE SURE YOU:   Understand these instructions.  Will watch your condition.  Will get help right away if you are not doing well or get worse. Document Released: 12/17/2007  Document Revised: 07/04/2012 Document Reviewed: 06/17/2009 [facility] Patient Information 2014 [place].     Elevate your feet at rest.  Get the heart ultrasound and we will call you with the results.  Do not drink more than 2 liters of fluid each day.  Avoid salt--limit your salt to 2 grams per day.  Stop hctz water pill.  Start lasix 20mg  daily.  Start kcl 8meq daily with the lasix.     Heart Failure Heart failure is a condition in which the heart has trouble pumping blood. This means your heart does not pump blood efficiently for your body to work well. In some cases of heart failure, fluid may back up into your lungs or you may have swelling (edema) in your lower legs. Heart failure is usually a long-term (chronic) condition. It is important for you to take good care of yourself and follow your caregiver's treatment plan. CAUSES  Some health conditions can cause heart failure. Those health conditions include:  High blood pressure (hypertension) causes the heart muscle to work harder than normal. When pressure in the blood vessels is high, the heart needs to pump (contract) with more force in order to circulate blood throughout the body. High blood pressure eventually causes the heart to become stiff and weak.  Coronary artery disease (CAD) is the buildup of cholesterol and fat (plaque) in the arteries of the heart. The  blockage in the arteries deprives the heart muscle of oxygen and blood. This can cause chest pain and may lead to a heart attack. High blood pressure can also contribute to CAD.  Heart attack (myocardial infarction) occurs when 1 or more arteries in the heart become blocked. The loss of oxygen damages the muscle tissue of the heart. When this happens, part of the heart muscle dies. The injured tissue does not contract as well and weakens the heart's ability to pump blood.  Abnormal heart valves can cause heart failure when the heart valves do not open and close properly.  This makes the heart muscle pump harder to keep the blood flowing.  Heart muscle disease (cardiomyopathy or myocarditis) is damage to the heart muscle from a variety of causes. These can include drug or alcohol abuse, infections, or unknown reasons. These can increase the risk of heart failure.  Lung disease makes the heart work harder because the lungs do not work properly. This can cause a strain on the heart, leading it to fail.  Diabetes increases the risk of heart failure. High blood sugar contributes to high fat (lipid) levels in the blood. Diabetes can also cause slow damage to tiny blood vessels that carry important nutrients to the heart muscle. When the heart does not get enough oxygen and food, it can cause the heart to become weak and stiff. This leads to a heart that does not contract efficiently.  Other conditions can contribute to heart failure. These include abnormal heart rhythms, thyroid problems, and low blood counts (anemia). Certain unhealthy behaviors can increase the risk of heart failure. Those unhealthy behaviors include:  Being overweight.  Smoking or chewing tobacco.  Eating foods high in fat and cholesterol.  Abusing illicit drugs or alcohol.  Lacking physical activity. SYMPTOMS  Heart failure symptoms may vary and can be hard to detect. Symptoms may include:  Shortness of breath with activity, such as climbing stairs.  Persistent cough.  Swelling of the feet, ankles, legs, or abdomen.  Unexplained weight gain.  Difficulty breathing when lying flat (orthopnea).  Waking from sleep because of the need to sit up and get more air.  Rapid heartbeat.  Fatigue and loss of energy.  Feeling lightheaded, dizzy, or close to fainting.  Loss of appetite.  Nausea.  Increased urination during the night (nocturia). DIAGNOSIS  A diagnosis of heart failure is based on your history, symptoms, physical examination, and diagnostic tests. Diagnostic tests for  heart failure may include:  Echocardiography.  Electrocardiography.  Chest X-ray.  Blood tests.  Exercise stress test.  Cardiac angiography.  Radionuclide scans. TREATMENT  Treatment is aimed at managing the symptoms of heart failure. Medicines, behavioral changes, or surgical intervention may be necessary to treat heart failure.  Medicines to help treat heart failure may include:  Angiotensin-converting enzyme (ACE) inhibitors. This type of medicine blocks the effects of a blood protein called angiotensin-converting enzyme. ACE inhibitors relax (dilate) the blood vessels and help lower blood pressure.  Angiotensin receptor blockers. This type of medicine blocks the actions of a blood protein called angiotensin. Angiotensin receptor blockers dilate the blood vessels and help lower blood pressure.  Water pills (diuretics). Diuretics cause the kidneys to remove salt and water from the blood. The extra fluid is removed through urination. This loss of extra fluid lowers the volume of blood the heart pumps.  Beta blockers. These prevent the heart from beating too fast and improve heart muscle strength.  Digitalis. This increases the force of  the heartbeat.  Healthy behavior changes include:  Obtaining and maintaining a healthy weight.  Stopping smoking or chewing tobacco.  Eating heart healthy foods.  Limiting or avoiding alcohol.  Stopping illicit drug use.  Physical activity as directed by your caregiver.  Surgical treatment for heart failure may include:  A procedure to open blocked arteries, repair damaged heart valves, or remove damaged heart muscle tissue.  A pacemaker to improve heart muscle function and control certain abnormal heart rhythms.  An internal cardioverter defibrillator to treat certain serious abnormal heart rhythms.  A left ventricular assist device to assist the pumping ability of the heart. HOME CARE INSTRUCTIONS   Take your medicine as  directed by your caregiver. Medicines are important in reducing the workload of your heart, slowing the progression of heart failure, and improving your symptoms.  Do not stop taking your medicine unless directed by your caregiver.  Do not skip any dose of medicine.  Refill your prescriptions before you run out of medicine. Your medicines are needed every day.  Take over-the-counter medicine only as directed by your caregiver or pharmacist.  Engage in moderate physical activity if directed by your caregiver. Moderate physical activity can benefit some people. The elderly and people with severe heart failure should consult with a caregiver for physical activity recommendations.  Eat heart healthy foods. Food choices should be free of trans fat and low in saturated fat, cholesterol, and salt (sodium). Healthy choices include fresh or frozen fruits and vegetables, fish, lean meats, legumes, fat-free or low-fat dairy products, and whole grain or high fiber foods. Talk to a dietitian to learn more about heart healthy foods.  Limit sodium if directed by your caregiver. Sodium restriction may reduce symptoms of heart failure in some people. Talk to a dietitian to learn more about heart healthy seasonings.  Use healthy cooking methods. Healthy cooking methods include roasting, grilling, broiling, baking, poaching, steaming, or stir-frying. Talk to a dietitian to learn more about healthy cooking methods.  Limit fluids if directed by your caregiver. Fluid restriction may reduce symptoms of heart failure in some people.  Weigh yourself every day. Daily weights are important in the early recognition of excess fluid. You should weigh yourself every morning after you urinate and before you eat breakfast. Wear the same amount of clothing each time you weigh yourself. Record your daily weight. Provide your caregiver with your weight record.  Monitor and record your blood pressure if directed by your  caregiver.  Check your pulse if directed by your caregiver.  Lose weight if directed by your caregiver. Weight loss may reduce symptoms of heart failure in some people.  Stop smoking or chewing tobacco. Nicotine makes your heart work harder by causing your blood vessels to constrict. Do not use nicotine gum or patches before talking to your caregiver.  Schedule and attend follow-up visits as directed by your caregiver. It is important to keep all your appointments.  Limit alcohol intake to no more than 1 drink per day for nonpregnant women and 2 drinks per day for men. Drinking more than that is harmful to your heart. Tell your caregiver if you drink alcohol several times a week. Talk with your caregiver about whether alcohol is safe for you. If your heart has already been damaged by alcohol or you have severe heart failure, drinking alcohol should be stopped completely.  Stop illicit drug use.  Stay up-to-date with immunizations. It is especially important to prevent respiratory infections through current pneumococcal and influenza  immunizations.  Manage other health conditions such as hypertension, diabetes, thyroid disease, or abnormal heart rhythms as directed by your caregiver.  Learn to manage stress.  Plan rest periods when fatigued.  Learn strategies to manage high temperatures. If the weather is extremely hot:  Avoid vigorous physical activity.  Use air conditioning or fans or seek a cooler location.  Avoid caffeine and alcohol.  Wear loose-fitting, lightweight, and light-colored clothing.  Learn strategies to manage cold temperatures. If the weather is extremely cold:  Avoid vigorous physical activity.  Layer clothes.  Wear mittens or gloves, a hat, and a scarf when going outside.  Avoid alcohol.  Obtain ongoing education and support as needed.  Participate or seek rehabilitation as needed to maintain or improve independence and quality of life. SEEK MEDICAL  CARE IF:   Your weight increases by 03 lb/1.4 kg in 1 day or 05 lb/2.3 kg in a week.  You have increasing shortness of breath that is unusual for you.  You are unable to participate in your usual physical activities.  You tire easily.  You cough more than normal, especially with physical activity.  You have any or more swelling in areas such as your hands, feet, ankles, or abdomen.  You are unable to sleep because it is hard to breathe.  You feel like your heart is beating fast (palpitations).  You become dizzy or lightheaded upon standing up. SEEK IMMEDIATE MEDICAL CARE IF:   You have difficulty breathing.  There is a change in mental status such as decreased alertness or difficulty with concentration.  You have a pain or discomfort in your chest.  You have an episode of fainting (syncope). MAKE SURE YOU:   Understand these instructions.  Will watch your condition.  Will get help right away if you are not doing well or get worse. Document Released: 03/09/2005 Document Revised: 07/04/2012 Document Reviewed: 03/31/2012 [facility] Patient Information 2014 [place], [place].  Stop your actos.  Start tradjenta 5mg  each morning for your sugar.  Please see Tivis Ringer to help you with your sugar.

## 2013-06-10 LAB — CBC WITH DIFFERENTIAL
Basophils Absolute: 0 10*3/uL (ref 0.0–0.2)
Basos: 0 %
Eos: 4 %
Eosinophils Absolute: 0.4 10*3/uL (ref 0.0–0.4)
HCT: 36.6 % (ref 34.0–46.6)
Hemoglobin: 12.5 g/dL (ref 11.1–15.9)
Immature Grans (Abs): 0 10*3/uL (ref 0.0–0.1)
Immature Granulocytes: 0 %
Lymphocytes Absolute: 2.5 10*3/uL (ref 0.7–3.1)
Lymphs: 28 %
MCH: 30.3 pg (ref 26.6–33.0)
MCHC: 34.2 g/dL (ref 31.5–35.7)
MCV: 89 fL (ref 79–97)
Monocytes Absolute: 0.7 10*3/uL (ref 0.1–0.9)
Monocytes: 8 %
Neutrophils Absolute: 5.4 10*3/uL (ref 1.4–7.0)
Neutrophils Relative %: 60 %
Platelets: 316 10*3/uL (ref 150–379)
RBC: 4.12 x10E6/uL (ref 3.77–5.28)
RDW: 14.6 % (ref 12.3–15.4)
WBC: 9.1 10*3/uL (ref 3.4–10.8)

## 2013-06-10 LAB — BASIC METABOLIC PANEL
BUN/Creatinine Ratio: 19 (ref 11–26)
BUN: 15 mg/dL (ref 8–27)
CO2: 26 mmol/L (ref 18–29)
Calcium: 9.3 mg/dL (ref 8.7–10.3)
Chloride: 97 mmol/L (ref 97–108)
Creatinine, Ser: 0.81 mg/dL (ref 0.57–1.00)
GFR calc Af Amer: 88 mL/min/{1.73_m2} (ref >59)
GFR calc non Af Amer: 76 mL/min/{1.73_m2} (ref >59)
Glucose: 164 mg/dL — ABNORMAL HIGH (ref 65–99)
Potassium: 4 mmol/L (ref 3.5–5.2)
Sodium: 143 mmol/L (ref 134–144)

## 2013-06-10 LAB — HEMOGLOBIN A1C
Est. average glucose Bld gHb Est-mCnc: 163 mg/dL
Hgb A1c MFr Bld: 7.3 % — ABNORMAL HIGH (ref 4.8–5.6)

## 2013-06-10 LAB — URIC ACID: Uric Acid: 5.9 mg/dL (ref 2.5–7.1)

## 2013-06-15 ENCOUNTER — Ambulatory Visit: Payer: Medicare HMO | Admitting: Internal Medicine

## 2013-06-15 ENCOUNTER — Ambulatory Visit (HOSPITAL_COMMUNITY)
Admission: RE | Admit: 2013-06-15 | Discharge: 2013-06-15 | Disposition: A | Discharge status: Home / Self Care | Payer: Medicare HMO | Source: Ambulatory Visit | Attending: Internal Medicine | Admitting: Internal Medicine

## 2013-06-15 DIAGNOSIS — K219 Gastro-esophageal reflux disease without esophagitis: Secondary | ICD-10-CM | POA: Insufficient documentation

## 2013-06-15 DIAGNOSIS — R609 Edema, unspecified: Secondary | ICD-10-CM | POA: Insufficient documentation

## 2013-06-15 DIAGNOSIS — E1149 Type 2 diabetes mellitus with other diabetic neurological complication: Secondary | ICD-10-CM

## 2013-06-15 DIAGNOSIS — I872 Venous insufficiency (chronic) (peripheral): Secondary | ICD-10-CM

## 2013-06-15 DIAGNOSIS — I10 Essential (primary) hypertension: Secondary | ICD-10-CM | POA: Insufficient documentation

## 2013-06-15 DIAGNOSIS — I517 Cardiomegaly: Secondary | ICD-10-CM | POA: Insufficient documentation

## 2013-06-15 DIAGNOSIS — M542 Cervicalgia: Secondary | ICD-10-CM | POA: Insufficient documentation

## 2013-06-15 DIAGNOSIS — E785 Hyperlipidemia, unspecified: Secondary | ICD-10-CM | POA: Insufficient documentation

## 2013-06-15 DIAGNOSIS — M949 Disorder of cartilage, unspecified: Secondary | ICD-10-CM

## 2013-06-15 DIAGNOSIS — Z6841 Body Mass Index (BMI) 40.0 and over, adult: Secondary | ICD-10-CM | POA: Insufficient documentation

## 2013-06-15 DIAGNOSIS — E119 Type 2 diabetes mellitus without complications: Secondary | ICD-10-CM | POA: Insufficient documentation

## 2013-06-15 DIAGNOSIS — M899 Disorder of bone, unspecified: Secondary | ICD-10-CM | POA: Insufficient documentation

## 2013-06-15 NOTE — Progress Notes (Signed)
  Echocardiogram 2D Echocardiogram has been performed.  Debra [name] 06/15/2013, 9:48 AM

## 2013-06-16 ENCOUNTER — Other Ambulatory Visit: Payer: Self-pay | Admitting: *Deleted

## 2013-06-16 ENCOUNTER — Telehealth: Payer: Self-pay | Admitting: *Deleted

## 2013-06-16 DIAGNOSIS — E1110 Type 2 diabetes mellitus with ketoacidosis without coma: Secondary | ICD-10-CM

## 2013-06-16 NOTE — Telephone Encounter (Signed)
Message copied by RICE, SHARON L on Fri Jun 16, 2013 12:08 PM ------      Message from: [place], [place] L      Created: Thu Jun 15, 2013  5:31 PM       Ultrasound of heart does not reveal any significant heart failure or valve problem to explain her edema.  She has had several previous studies of her veins in her legs, but I don't see any about her arteries.  I would like her to have bilateral arterial dopplers with ABIs at rest to check her arterial circulation.  I would also like her to come give a urine sample for microalbumin to see how much her diabetes is affecting her kidneys. ------

## 2013-06-16 NOTE — Telephone Encounter (Signed)
Spoke with patient concerning her [place], explained the results and she states that her sugar has been dropping every evening: 03/25-88, 03/26-108. Patient states that she will come in on Tuesday to give a urine sample.

## 2013-06-19 ENCOUNTER — Ambulatory Visit: Payer: Commercial Managed Care - HMO | Admitting: Pharmacotherapy

## 2013-06-20 ENCOUNTER — Other Ambulatory Visit: Payer: Commercial Managed Care - HMO

## 2013-06-20 DIAGNOSIS — E1110 Type 2 diabetes mellitus with ketoacidosis without coma: Secondary | ICD-10-CM

## 2013-06-21 ENCOUNTER — Telehealth: Payer: Self-pay | Admitting: *Deleted

## 2013-06-21 LAB — MICROALBUMIN / CREATININE URINE RATIO
Creatinine, Ur: 35.3 mg/dL (ref 15.0–278.0)
MICROALB/CREAT RATIO: <8.5 mg/g creat (ref 0.0–30.0)
Microalbumin, Urine: <3 ug/mL (ref 0.0–17.0)

## 2013-06-21 NOTE — Telephone Encounter (Signed)
Message copied by RICE, SHARON L on Wed Jun 21, 2013 12:11 PM ------      Message from: REED, [place] L      Created: Wed Jun 21, 2013 10:32 AM       Does not have significant protein in her urine.  This is good.  The lisinopril is helping to protect her kidneys from the sugar. ------

## 2013-06-21 NOTE — Telephone Encounter (Signed)
Message copied by RICE, SHARON L on Wed Jun 21, 2013  1:15 PM ------      Message from: REED, [place] L      Created: Wed Jun 21, 2013 10:32 AM       Does not have significant protein in her urine.  This is good.  The lisinopril is helping to protect her kidneys from the sugar. ------

## 2013-06-21 NOTE — Telephone Encounter (Signed)
Left message to return call for lab results.

## 2013-06-21 NOTE — Telephone Encounter (Signed)
Spoke with patient about lab results and she stated that she would take the medicine if her sugar is not below 120.

## 2013-06-21 NOTE — Telephone Encounter (Signed)
Those glucose values are normal.  I would be concerned if she was dropping below 70.

## 2013-06-29 ENCOUNTER — Encounter (HOSPITAL_COMMUNITY): Payer: Self-pay | Admitting: Emergency Medicine

## 2013-06-29 ENCOUNTER — Other Ambulatory Visit: Payer: Self-pay | Admitting: Internal Medicine

## 2013-06-29 ENCOUNTER — Emergency Department (HOSPITAL_COMMUNITY)
Admission: EM | Admit: 2013-06-29 | Discharge: 2013-06-29 | Disposition: A | Discharge status: Home / Self Care | Payer: Medicare HMO | Attending: Emergency Medicine | Admitting: Emergency Medicine

## 2013-06-29 ENCOUNTER — Emergency Department (HOSPITAL_COMMUNITY): Payer: Medicare HMO

## 2013-06-29 DIAGNOSIS — R609 Edema, unspecified: Secondary | ICD-10-CM | POA: Insufficient documentation

## 2013-06-29 DIAGNOSIS — E785 Hyperlipidemia, unspecified: Secondary | ICD-10-CM | POA: Insufficient documentation

## 2013-06-29 DIAGNOSIS — Z8739 Personal history of other diseases of the musculoskeletal system and connective tissue: Secondary | ICD-10-CM | POA: Insufficient documentation

## 2013-06-29 DIAGNOSIS — E1149 Type 2 diabetes mellitus with other diabetic neurological complication: Secondary | ICD-10-CM | POA: Insufficient documentation

## 2013-06-29 DIAGNOSIS — Z872 Personal history of diseases of the skin and subcutaneous tissue: Secondary | ICD-10-CM | POA: Insufficient documentation

## 2013-06-29 DIAGNOSIS — R509 Fever, unspecified: Secondary | ICD-10-CM | POA: Insufficient documentation

## 2013-06-29 DIAGNOSIS — Z79899 Other long term (current) drug therapy: Secondary | ICD-10-CM | POA: Insufficient documentation

## 2013-06-29 DIAGNOSIS — Z880 Allergy status to penicillin: Secondary | ICD-10-CM | POA: Insufficient documentation

## 2013-06-29 DIAGNOSIS — Z8719 Personal history of other diseases of the digestive system: Secondary | ICD-10-CM | POA: Insufficient documentation

## 2013-06-29 DIAGNOSIS — I10 Essential (primary) hypertension: Secondary | ICD-10-CM | POA: Insufficient documentation

## 2013-06-29 DIAGNOSIS — J40 Bronchitis, not specified as acute or chronic: Secondary | ICD-10-CM

## 2013-06-29 DIAGNOSIS — Z91040 Latex allergy status: Secondary | ICD-10-CM | POA: Insufficient documentation

## 2013-06-29 LAB — BASIC METABOLIC PANEL
BUN: 26 mg/dL — ABNORMAL HIGH (ref 6–23)
CO2: 20 meq/L (ref 19–32)
Calcium: 9.3 mg/dL (ref 8.4–10.5)
Chloride: 100 mEq/L (ref 96–112)
Creatinine, Ser: 1.03 mg/dL (ref 0.50–1.10)
GFR calc Af Amer: 64 mL/min — ABNORMAL LOW (ref >90)
GFR calc non Af Amer: 55 mL/min — ABNORMAL LOW (ref >90)
GLUCOSE: 160 mg/dL — AB (ref 70–99)
Potassium: 3.9 mEq/L (ref 3.7–5.3)
SODIUM: 141 meq/L (ref 137–147)

## 2013-06-29 LAB — CBC WITH DIFFERENTIAL/PLATELET
Basophils Absolute: 0 10*3/uL (ref 0.0–0.1)
Basophils Relative: 1 % (ref 0–1)
Eosinophils Absolute: 0.4 10*3/uL (ref 0.0–0.7)
Eosinophils Relative: 7 % — ABNORMAL HIGH (ref 0–5)
HCT: 38.2 % (ref 36.0–46.0)
Hemoglobin: 12.6 g/dL (ref 12.0–15.0)
LYMPHS ABS: 3.1 10*3/uL (ref 0.7–4.0)
LYMPHS PCT: 53 % — AB (ref 12–46)
MCH: 30.5 pg (ref 26.0–34.0)
MCHC: 33 g/dL (ref 30.0–36.0)
MCV: 92.5 fL (ref 78.0–100.0)
Monocytes Absolute: 0.6 10*3/uL (ref 0.1–1.0)
Monocytes Relative: 10 % (ref 3–12)
NEUTROS ABS: 1.8 10*3/uL (ref 1.7–7.7)
NEUTROS PCT: 30 % — AB (ref 43–77)
PLATELETS: 214 10*3/uL (ref 150–400)
RBC: 4.13 MIL/uL (ref 3.87–5.11)
RDW: 15.4 % (ref 11.5–15.5)
WBC: 6 10*3/uL (ref 4.0–10.5)

## 2013-06-29 LAB — CBG MONITORING, ED: GLUCOSE-CAPILLARY: 169 mg/dL — AB (ref 70–99)

## 2013-06-29 LAB — I-STAT TROPONIN, ED: Troponin i, poc: 0.01 ng/mL (ref 0.00–0.08)

## 2013-06-29 MED ORDER — IPRATROPIUM BROMIDE 0.02 % IN SOLN
0.5000 mg | Freq: Once | RESPIRATORY_TRACT | Status: AC
  Administered 2013-06-29: 0.5 mg via RESPIRATORY_TRACT
  Filled 2013-06-29: qty 2.5

## 2013-06-29 MED ORDER — ALBUTEROL SULFATE HFA 108 (90 BASE) MCG/ACT IN AERS
2.0000 | INHALATION_SPRAY | RESPIRATORY_TRACT | Status: DC | PRN
  Administered 2013-06-29: 2 via RESPIRATORY_TRACT
  Filled 2013-06-29: qty 6.7

## 2013-06-29 MED ORDER — ALBUTEROL SULFATE (2.5 MG/3ML) 0.083% IN NEBU
5.0000 mg | INHALATION_SOLUTION | Freq: Once | RESPIRATORY_TRACT | Status: AC
  Administered 2013-06-29: 5 mg via RESPIRATORY_TRACT
  Filled 2013-06-29: qty 6

## 2013-06-29 NOTE — ED Notes (Signed)
Pt returned from XRAY 

## 2013-06-29 NOTE — ED Notes (Signed)
PA Josh at bedside. 

## 2013-06-29 NOTE — Discharge Instructions (Signed)
Please read and follow all provided instructions.  Your diagnoses today include:  1. Bronchitis    Tests performed today include:  Chest x-ray - does not show any pneumonia  Blood counts and electrolytes - normal  Blood test for heart muscle damage - shows no sign of heart attack  EKG - no obvious signs of heart attack.   Vital signs. See below for your results today.   Medications prescribed:   Albuterol inhaler - medication that opens up your airway  Use inhaler as follows: 1-2 puffs with spacer every 4 hours as needed for wheezing, cough, or shortness of breath.   Take any prescribed medications only as directed.  Home care instructions:  Follow any educational materials contained in this packet.  Follow-up instructions: Please follow-up with your primary care provider tomorrow as planned for further evaluation of your symptoms and a recheck if you are not feeling better. If you do not have a primary care doctor -- see below for referral information.   Return instructions:   Please return to the Emergency Department if you experience worsening symptoms.  Please return with worsening wheezing, shortness of breath, or difficulty breathing.  Return with persistent fever above 101F.   Please return if you have any other emergent concerns.  Additional Information:  Your vital signs today were: BP 122/97   Pulse 88   Temp(Src) 98.4 F (36.9 C) (Oral)   Resp 18   Ht 5\' 4"  (1.626 m)   Wt 300 lb (136.079 kg)   BMI 51.47 kg/m2   SpO2 96%   LMP 07/02/1973 If your blood pressure (BP) was elevated above 135/85 this visit, please have this repeated by your doctor within one month. --------------

## 2013-06-29 NOTE — ED Provider Notes (Signed)
CSN: [id number]     Arrival date & time 06/29/13  0522 History   First MD Initiated Contact with Patient 06/29/13 [phone]     Chief Complaint  Patient presents with  . Shortness of Breath     (Consider location/radiation/quality/duration/timing/severity/associated sxs/prior Treatment) HPI Comments: Patient with h/o DM, high cholesterol, HTN -- presents with c/o SOB x 10 days that began with post-nasal drip and reported URI. She had subjective fever at onset as well as productive cough. These are now resolved but patient has dry cough, SOB, wheezing. These symptoms are worse at nighta and keep her awake. Patient does not report worsening SOB with exertion. She has history of superficial blood clot in left left which she told was not a problem. No h/o DVT/PE. Patient denies risk factors for pulmonary embolism including: unilateral leg swelling, history of DVT/PE/other blood clots, use of estrogens, recent immobilizations, recent surgery, recent travel (>4hr segment), malignancy, hemoptysis. She does not have a h/o COPD, CHF (recent echo neg per patient). She does not have a smoking history. No h/o MI/UA. The onset of this condition was acute. The course is constant. Aggravating factors: none. Alleviating factors: none.      Patient is a 68 y.o. female presenting with shortness of breath. The history is provided by the patient and medical records.  Shortness of Breath Associated symptoms: cough and wheezing   Associated symptoms: no abdominal pain, no chest pain, no fever, no headaches, no rash, no sore throat and no vomiting     Past Medical History  Diagnosis Date  . Diabetes mellitus with neurological manifestation   . Hypertension goal BP (blood pressure) < 130/80   . Hyperlipidemia LDL goal < 100   . Osteopenia     DEXA scan 7/07  . Chronic venous insufficiency   . Morbid obesity   . Diverticulitis     h/o 2-3 episodes in past  . Leg edema     secondary to chronic venous insufficiency   . Cellulitis of left leg     history of, most recent episode 01/09   Past Surgical History  Procedure Laterality Date  . Appendectomy    . Abdominal hysterectomy    . Colectomy      with diverting colsotmy and revision in the 1990's for diverticulitis  . Eye surgery  8/13    left cataract removal & retinal repair  . Cholecystectomy    . Cholecystectomy N/A 04/14/2013    Procedure: LAPAROSCOPIC CHOLECYSTECTOMY WITH INTRAOPERATIVE CHOLANGIOGRAM;  Surgeon: Adin Hector, MD;  Location: [facility];  Service: General;  Laterality: N/A;  . Laparoscopic lysis of adhesions N/A 04/14/2013    Procedure: LAPAROSCOPIC LYSIS OF ADHESIONS;  Surgeon: Adin Hector, MD;  Location: [facility];  Service: General;  Laterality: N/A;  . Ercp N/A 04/17/2013    Procedure: ENDOSCOPIC RETROGRADE CHOLANGIOPANCREATOGRAPHY (ERCP);  Surgeon: Irene Shipper, MD;  Location: [facility];  Service: Endoscopy;  Laterality: N/A;  note pt want general anesthesia for this case.  preferto perform in endo unit, but if unavailable please arrange time in OR   Family History  Problem Relation Age of Onset  . Heart disease Mother   . Heart disease Father   . Heart disease Brother   . Cancer Brother   . Cancer Brother   . Hypertension Sister   . Heart disease Sister    History  Substance Use Topics  . Smoking status: Never Smoker   . Smokeless tobacco: Never Used  .  Alcohol Use: No   OB History   Grav Para Term Preterm Abortions TAB SAB Ect Mult Living                 Review of Systems  Constitutional: Negative for fever.  HENT: Negative for rhinorrhea and sore throat.   Eyes: Negative for redness.  Respiratory: Positive for cough, shortness of breath and wheezing.   Cardiovascular: Negative for chest pain.  Gastrointestinal: Negative for nausea, vomiting, abdominal pain and diarrhea.  Genitourinary: Negative for dysuria.  Musculoskeletal: Negative for myalgias.  Skin: Positive for wound (healing abrasion R shin,  followed by PCP). Negative for rash.  Neurological: Negative for headaches.      Allergies  Aspirin; Banana; Hydrocodone; Neomycin-polymyxin-gramicidin; Penicillins; Tramadol; and Latex  Home Medications   Current Outpatient Rx  Name  Route  Sig  Dispense  Refill  . allopurinol (ZYLOPRIM) 100 MG tablet   Oral   Take 100 mg by mouth 2 (two) times daily. For Gout         . Alogliptin-Pioglitazone (OSENI) 12.5-30 MG TABS   Oral   Take 1 tablet by mouth daily.         Marland Kitchen atorvastatin (LIPITOR) 20 MG tablet   Oral   Take 1 tablet (20 mg total) by mouth daily.   90 tablet   3   . diphenhydrAMINE (BENADRYL) 25 MG tablet   Oral   Take 2 tablets (50 mg total) by mouth every 6 (six) hours as needed for itching.         . furosemide (LASIX) 20 MG tablet   Oral   Take 1 tablet (20 mg total) by mouth daily. For swelling   30 tablet   3   . linagliptin (TRADJENTA) 5 MG TABS tablet   Oral   Take 1 tablet (5 mg total) by mouth daily.   30 tablet   3   . lisinopril (PRINIVIL,ZESTRIL) 40 MG tablet   Oral   Take 40 mg by mouth daily.         . metFORMIN (GLUCOPHAGE) 1000 MG tablet   Oral   Take 1,000 mg by mouth 2 (two) times daily with a meal.         . metoprolol succinate (TOPROL-XL) 25 MG 24 hr tablet   Oral   Take 25 mg by mouth daily.         Marland Kitchen omeprazole (PRILOSEC) 20 MG capsule      TAKE ONE CAPSULE BY MOUTH DAILY   30 capsule   5   . ondansetron (ZOFRAN ODT) 4 MG disintegrating tablet   Oral   Take 1 tablet (4 mg total) by mouth every 8 (eight) hours as needed for nausea or vomiting.   20 tablet   0   . potassium chloride SA (K-DUR,KLOR-CON) 20 MEQ tablet   Oral   Take 1 tablet (20 mEq total) by mouth daily. For potassium supplement   30 tablet   3   . pregabalin (LYRICA) 75 MG capsule   Oral   Take 1 capsule (75 mg total) by mouth 3 (three) times daily.   90 capsule   2   . ibuprofen (ADVIL,MOTRIN) 200 MG tablet   Oral   Take 400 mg  by mouth every 6 (six) hours as needed for pain. For pain          BP 129/59  Pulse 88  Temp(Src) 98.4 F (36.9 C) (Oral)  Resp 10  Ht 5\' 4"  (  1.626 m)  Wt 300 lb (136.079 kg)  BMI 51.47 kg/m2  SpO2 94%  LMP 07/02/1973 Physical Exam  Nursing note and vitals reviewed. Constitutional: She appears well-developed and well-nourished.  HENT:  Head: Normocephalic and atraumatic.  Nose: Nose normal.  Mouth/Throat: Mucous membranes are normal. Mucous membranes are not dry.  Eyes: Conjunctivae are normal. Right eye exhibits no discharge. Left eye exhibits no discharge.  Neck: Trachea normal and normal range of motion. Neck supple. Normal carotid pulses and no JVD present. No muscular tenderness present. Carotid bruit is not present. No tracheal deviation present.  Cardiovascular: Normal rate, regular rhythm, S1 normal, S2 normal, normal heart sounds and intact distal pulses.  Exam reveals no decreased pulses.   No murmur heard. Pulmonary/Chest: Effort normal. No respiratory distress. She has wheezes (mild end expiratory, heard best over anterior chest at bases). She has no rales. She exhibits no tenderness.  Abdominal: Soft. Normal aorta and bowel sounds are normal. There is no tenderness. There is no rebound and no guarding.  Musculoskeletal: Normal range of motion. She exhibits edema (1+ bilateral non-pitting edema to ankles).  Neurological: She is alert.  Skin: Skin is warm and dry. She is not diaphoretic. No cyanosis. No pallor.  Healing erythematous abrasion with mild clear weeping R shin.   Psychiatric: She has a normal mood and affect.    ED Course  Procedures (including critical care time) Labs Review Labs Reviewed  CBG MONITORING, ED - Abnormal; Notable for the following:    Glucose-Capillary 169 (*)    All other components within normal limits  CBC WITH DIFFERENTIAL  BASIC METABOLIC PANEL  I-STAT TROPOININ, ED   Imaging Review Dg Chest 2 View  06/29/2013   CLINICAL DATA:   Chest pain  EXAM: CHEST  2 VIEW  COMPARISON:  05/08/2013  FINDINGS: Normal heart size and mediastinal contours. No acute infiltrate or edema. No effusion or pneumothorax. No acute osseous findings. Cholecystectomy changes.  IMPRESSION: No active cardiopulmonary disease.   Electronically Signed   By: Jorje Guild M.D.   On: 06/29/2013 05:59     EKG Interpretation None      6:18 AM Patient seen and examined. Work-up initiated. EKG reviewed. Patient does not have any CP. Doubt SOB as anginal equivalent, but as there is not an obvious cause for SOB so will check troponin. Patient has mild wheezing. Pt informed of x-ray result. Pt O2 sat to 92% lying in bed off supplemental O2.   Vital signs reviewed and are as follows: Filed Vitals:   06/29/13 0533  BP: 129/59  Pulse: 88  Temp: 98.4 F (36.9 C)  Resp: 10     Date: 06/29/2013  Rate: 84  Rhythm: normal sinus rhythm  QRS Axis: normal  Intervals: normal  ST/T Wave abnormalities: normal  Conduction Disutrbances:none  Narrative Interpretation: low voltage, unchanged  Old EKG Reviewed: unchanged 1/15  8:17 AM Patient improved after albuterol neb. She has ambulated, maintaining O2 sat in mid-90's and states she did not feel SOB with ambulation. Patient appears well and has appt with PCP tomorrow morning.   Feel patient is safe for d/c with albuterol inhaler. Patient d/w and seen by Dr. Tawnya Crook. Agrees with plan.   Pt urged to return with worsening SOB, CP, fever, other concerns. She and her husband agree with plan.   BP 122/97  Pulse 88  Temp(Src) 98.4 F (36.9 C) (Oral)  Resp 18  Ht 5\' 4"  (1.626 m)  Wt 300 lb (136.079 kg)  BMI 51.47 kg/m2  SpO2 96%  LMP 07/02/1973   MDM   Final diagnoses:  Bronchitis   Patient with SOB/mild wheezing. She does not have CP or hypoxia. EKG with low voltage but unchanged and no signs of pericardial effusion on exam. CXR does not show edema or PNA. Recent ECHO (3/26) does not show evidence  of HF. Troponin neg and I do not think this is an anginal equivalent with her mild wheezing improved with albuterol. No signs of PE, she is low risk for PE, no compelling evidence that makes me want to check a d-dimer. Patient is improved, appears well, and has PCP f/u if worsening. She appears reliable to return if worsening. Will d/c.   .Midge Minium, PA-C 06/29/13 [phone]

## 2013-06-29 NOTE — ED Notes (Signed)
Pt ambulated in [place] with room air saturation initially at 94%  She maintained sats at 95% on room air.

## 2013-06-29 NOTE — ED Notes (Signed)
Pt in XRAY 

## 2013-06-29 NOTE — ED Provider Notes (Signed)
Medical screening examination/treatment/procedure(s) were conducted as a shared visit with non-physician practitioner(s) and myself.  I personally evaluated the patient during the encounter.  On PE after breathing treatment, pt reports feeling much improved, cardiopulm exam benign. Os stat 95% on RA.  Suspect acute viral syndrome. Pt will f/u with her PCP as scheduled tomorrow.    EKG Interpretation   Date/Time:  Thursday June 29 2013 05:31:21 EDT Ventricular Rate:  84 PR Interval:  125 QRS Duration: 94 QT Interval:  384 QTC Calculation: 622 R Axis:   48 Text Interpretation:  Sinus rhythm Low voltage, precordial leads Confirmed  by OTTER  MD, OLGA ([id number]) on 06/29/2013 7:41:10 AM        Neta Ehlers, MD 06/29/13 [phone]

## 2013-06-29 NOTE — ED Notes (Signed)
Pt from home c/o chest congestion and shortness of breath x 2 weeks. She also reports a fever and a slightly productive cough which is yellow in color. Expiratory wheezes on the left diminished on right.

## 2013-06-30 ENCOUNTER — Ambulatory Visit (INDEPENDENT_AMBULATORY_CARE_PROVIDER_SITE_OTHER): Payer: Commercial Managed Care - HMO | Admitting: Internal Medicine

## 2013-06-30 ENCOUNTER — Encounter: Payer: Self-pay | Admitting: Internal Medicine

## 2013-06-30 VITALS — BP 126/80 | HR 77 | Temp 98.0°F | Wt 264.0 lb

## 2013-06-30 DIAGNOSIS — I872 Venous insufficiency (chronic) (peripheral): Secondary | ICD-10-CM

## 2013-06-30 DIAGNOSIS — E1149 Type 2 diabetes mellitus with other diabetic neurological complication: Secondary | ICD-10-CM

## 2013-06-30 DIAGNOSIS — IMO0002 Reserved for concepts with insufficient information to code with codable children: Secondary | ICD-10-CM

## 2013-06-30 DIAGNOSIS — J0100 Acute maxillary sinusitis, unspecified: Secondary | ICD-10-CM

## 2013-06-30 DIAGNOSIS — S80811A Abrasion, right lower leg, initial encounter: Secondary | ICD-10-CM

## 2013-06-30 NOTE — Patient Instructions (Signed)
Try diabetic cough syrup for your cough Use albuterol when chest tight or you have a period of coughing and can't stop Use warm humidity (sitting in shower) to try to loosen your mucus.   You may also use a saline flush of your sinuses to help with your head congestion.  Get arterial doppler with ABIs done to evaluate your arterial circulation--if this is normal, we may be able to wrap your legs to help with the swelling.

## 2013-06-30 NOTE — Progress Notes (Signed)
Patient ID: Debra [name], female   DOB: [birth year], 68 y.o.   MRN: [id number]   Location:  [facility]  Code Status: full code  Allergies  Allergen Reactions  . Aspirin Nausea Only    And causes bruises  . Banana Hives  . Hydrocodone Hives  . Neomycin-Polymyxin-Gramicidin Itching and Swelling    Eye drops caused swelling in face and rash and itching on arms and neck  . Penicillins Hives and Itching  . Tramadol Nausea And Vomiting  . Latex Hives and Rash    Chief Complaint  Patient presents with  . Leg Problem    1 week follow-up on right leg- patient still with swelling (slightly inproved) and drainage   . URI    Wheezing, sob, and cough, seen in ER yesterday     HPI: Patient is a 68 y.o. white female seen in the office today for f/u on right leg edema and wheezing, cough and shortness of breath.  She went to the ED and xray was negative for pneumonia or any acute cardiopulmonary disease.  Her EKG was nsr at 84.  Has had poor appetite with URI.  Got breathing treatment there and albuterol inhaler. Is trying to use water to get rid of grease when cooking instead of oil.  Swelling of right leg is a little improved.    Review of Systems:  Review of Systems  Constitutional: Positive for chills, weight loss and malaise/fatigue. Negative for fever.  HENT: Positive for congestion.   Eyes: Negative for blurred vision.  Respiratory: Positive for cough, sputum production and shortness of breath. Negative for wheezing.   Cardiovascular: Negative for chest pain and palpitations.  Gastrointestinal: Negative for heartburn and abdominal pain.  Genitourinary: Negative for dysuria.  Musculoskeletal: Negative for falls.  Skin: Negative for itching and rash.  Neurological: Positive for weakness and headaches. Negative for dizziness.  Endo/Heme/Allergies: Bruises/bleeds easily.  Psychiatric/Behavioral: Positive for depression. Negative for memory loss.  The patient is nervous/anxious.      Past Medical History  Diagnosis Date  . Diabetes mellitus with neurological manifestation   . Hypertension goal BP (blood pressure) < 130/80   . Hyperlipidemia LDL goal < 100   . Osteopenia     DEXA scan 7/07  . Chronic venous insufficiency   . Morbid obesity   . Diverticulitis     h/o 2-3 episodes in past  . Leg edema     secondary to chronic venous insufficiency  . Cellulitis of left leg     history of, most recent episode 01/09    Past Surgical History  Procedure Laterality Date  . Appendectomy    . Abdominal hysterectomy    . Colectomy      with diverting colsotmy and revision in the 1990's for diverticulitis  . Eye surgery  8/13    left cataract removal & retinal repair  . Cholecystectomy    . Cholecystectomy N/A 04/14/2013    Procedure: LAPAROSCOPIC CHOLECYSTECTOMY WITH INTRAOPERATIVE CHOLANGIOGRAM;  Surgeon: Adin Hector, MD;  Location: [facility];  Service: General;  Laterality: N/A;  . Laparoscopic lysis of adhesions N/A 04/14/2013    Procedure: LAPAROSCOPIC LYSIS OF ADHESIONS;  Surgeon: Adin Hector, MD;  Location: [facility];  Service: General;  Laterality: N/A;  . Ercp N/A 04/17/2013    Procedure: ENDOSCOPIC RETROGRADE CHOLANGIOPANCREATOGRAPHY (ERCP);  Surgeon: Irene Shipper, MD;  Location: [facility];  Service: Endoscopy;  Laterality: N/A;  note pt want general  anesthesia for this case.  preferto perform in endo unit, but if unavailable please arrange time in OR    Social History:   reports that she has never smoked. She has never used smokeless tobacco. She reports that she does not drink alcohol or use illicit drugs.  Family History  Problem Relation Age of Onset  . Heart disease Mother   . Heart disease Father   . Heart disease Brother   . Cancer Brother   . Cancer Brother   . Hypertension Sister   . Heart disease Sister     Medications: Patient's Medications  New Prescriptions   No medications on file    Previous Medications   ALBUTEROL (PROVENTIL HFA;VENTOLIN HFA) 108 (90 BASE) MCG/ACT INHALER    Inhale 2 puffs into the lungs every 4 (four) hours as needed for wheezing or shortness of breath. For wheezing and SOB   ALLOPURINOL (ZYLOPRIM) 100 MG TABLET    Take 100 mg by mouth 2 (two) times daily. For Gout   ALOGLIPTIN-PIOGLITAZONE (OSENI) 12.5-30 MG TABS    Take 1 tablet by mouth daily.   ATORVASTATIN (LIPITOR) 20 MG TABLET    Take 1 tablet (20 mg total) by mouth daily.   DIPHENHYDRAMINE (BENADRYL) 25 MG TABLET    Take 2 tablets (50 mg total) by mouth every 6 (six) hours as needed for itching.   FUROSEMIDE (LASIX) 20 MG TABLET    Take 1 tablet (20 mg total) by mouth daily. For swelling   IBUPROFEN (ADVIL,MOTRIN) 200 MG TABLET    Take 400 mg by mouth every 6 (six) hours as needed for pain. For pain   LINAGLIPTIN (TRADJENTA) 5 MG TABS TABLET    Take 1 tablet (5 mg total) by mouth daily.   LISINOPRIL (PRINIVIL,ZESTRIL) 40 MG TABLET    Take 40 mg by mouth daily.   METFORMIN (GLUCOPHAGE) 1000 MG TABLET    TAKE 1 TABLET BY MOUTH TWICE DAILY WITH A MEAL   METOPROLOL SUCCINATE (TOPROL-XL) 25 MG 24 HR TABLET    Take 25 mg by mouth daily.   OMEPRAZOLE (PRILOSEC) 20 MG CAPSULE    TAKE ONE CAPSULE BY MOUTH DAILY   ONDANSETRON (ZOFRAN ODT) 4 MG DISINTEGRATING TABLET    Take 1 tablet (4 mg total) by mouth every 8 (eight) hours as needed for nausea or vomiting.   POTASSIUM CHLORIDE SA (K-DUR,KLOR-CON) 20 MEQ TABLET    Take 1 tablet (20 mEq total) by mouth daily. For potassium supplement   PREGABALIN (LYRICA) 75 MG CAPSULE    Take 1 capsule (75 mg total) by mouth 3 (three) times daily.  Modified Medications   No medications on file  Discontinued Medications   METFORMIN (GLUCOPHAGE) 1000 MG TABLET    Take 1,000 mg by mouth 2 (two) times daily with a meal.     Physical Exam: Filed Vitals:   06/30/13 0829  BP: 126/80  Pulse: 77  Temp: 98 F (36.7 C)  TempSrc: Oral  Weight: 264 lb (119.75 kg)  SpO2:  95%  Physical Exam  Constitutional: She is oriented to person, place, and time. She appears well-developed and well-nourished. No distress.  Obese white female  Neck: No JVD present.  Cardiovascular: Normal rate, regular rhythm, normal heart sounds and intact distal pulses.   Edema of bilateral LE improved vs. Last visit;  Down 10 lbs  Pulmonary/Chest: Effort normal and breath sounds normal. No respiratory distress.  Few rhonchi upper lung fields  Abdominal: Soft. Bowel sounds are normal. She  exhibits no distension. There is no tenderness.  Neurological: She is alert and oriented to person, place, and time.  Skin:  Right leg with several small very superficial open areas and deep tissue injury beneath;  Has chronic venous stasis changes bilateral LE  Psychiatric: She has a normal mood and affect.  Anxious, talks a lot about her husband's problems     Labs reviewed: Basic Metabolic Panel:  Recent Labs  11/28/12 1054  04/14/13 0524  04/19/13 0442  05/03/13 1622 05/08/13 0359 06/09/13 1209 06/29/13 0540  NA 147*  < > 136*  < > 135*  < > 138 142 143 141  K 4.6  < > 3.9  < > 3.7  < > 4.5 3.5* 4.0 3.9  CL 102  < > 97  < > 93*  < > 96 99 97 100  CO2 25  < > 24  < > 30  < > 27  --  26 20  GLUCOSE 129*  < > 224*  < > 190*  < > 177* 179* 164* 160*  BUN 18  < > 14  < > 11  < > 20 24* 15 26*  CREATININE 0.91  < > 0.71  < > 0.72  < > 0.87 1.20* 0.81 1.03  CALCIUM 9.9  < > 9.0  < > 8.7  < > 9.8  --  9.3 9.3  MG  --   --  1.2*  --  1.5  --   --   --   --   --   TSH 3.630  --   --   --   --   --   --   --   --   --   < > = values in this interval not displayed. Liver Function Tests:  Recent Labs  04/15/13 0526 04/18/13 0531 04/19/13 0442 04/26/13 1133  AST 294* 130* 164* 21  ALT 518* 243* 244* 41*  ALKPHOS 152* 176* 194* 139*  BILITOT 1.0 5.3* 4.4* 0.9  PROT 6.5 6.4 6.1 6.8  ALBUMIN 3.2* 2.6* 2.5*  --     Recent Labs  01/19/13 1422  04/15/13 0526 04/18/13 0531  04/19/13 0442 04/26/13 1133  LIPASE 24  < >  --  497* 122* 84*  AMYLASE 52  --  43  --   --  58  < > = values in this interval not displayed. CBC:  Recent Labs  05/08/13 0324 05/08/13 0359 06/09/13 1209 06/29/13 0540  WBC 6.2  --  9.1 6.0  NEUTROABS 3.1  --  5.4 1.8  HGB 12.2 13.6 12.5 12.6  HCT 36.9 40.0 36.6 38.2  MCV 91.6  --  89 92.5  PLT 251  --  316 214   Lab Results  Component Value Date   HGBA1C 7.3* 06/09/2013   Past Procedures:  Venous dopplers CXR Echo  All reviewed   Assessment/Plan 1. Chronic venous insufficiency -check arterial doppler with ABIs next -no CHF on xray or echo, but has amazing improvement in edema since avoiding salt and fatty foods  2. Diabetes mellitus with neurological manifestation -glucose had been fairly well controlled except that she's been using regular cough syrup with her URI  3. Abrasion of right lower leg -is slowly getting better -advised to stop dressing it and leave it open to air -cont to monitor for infection  4. Morbid obesity -has lost 10 lbs since last visit--seems this was mostly fluid  5.  Acute maxillary sinusitis -  pt given conservative measures -use albuterol per ed -get diabetic cough syrup -Use warm humidity -Use nettipot -Let me know if fever or chest tightness getting worse  Labs/tests ordered:  None today Next appt:  As scheduled

## 2013-07-04 ENCOUNTER — Ambulatory Visit (HOSPITAL_COMMUNITY): Admission: RE | Admit: 2013-07-04 | Payer: Medicare HMO | Source: Ambulatory Visit

## 2013-07-04 ENCOUNTER — Ambulatory Visit (HOSPITAL_COMMUNITY): Payer: Medicare HMO

## 2013-07-26 ENCOUNTER — Other Ambulatory Visit: Payer: Self-pay | Admitting: *Deleted

## 2013-07-26 ENCOUNTER — Other Ambulatory Visit: Payer: Self-pay | Admitting: Internal Medicine

## 2013-07-26 MED ORDER — PREGABALIN 75 MG PO CAPS: ORAL_CAPSULE | ORAL | Status: DC

## 2013-08-03 ENCOUNTER — Encounter: Payer: Self-pay | Admitting: Internal Medicine

## 2013-08-11 ENCOUNTER — Ambulatory Visit (INDEPENDENT_AMBULATORY_CARE_PROVIDER_SITE_OTHER): Payer: Commercial Managed Care - HMO | Admitting: Internal Medicine

## 2013-08-11 ENCOUNTER — Encounter: Payer: Self-pay | Admitting: Internal Medicine

## 2013-08-11 VITALS — BP 132/86 | HR 70 | Temp 98.4°F | Wt 244.6 lb

## 2013-08-11 DIAGNOSIS — Z5181 Encounter for therapeutic drug level monitoring: Secondary | ICD-10-CM

## 2013-08-11 DIAGNOSIS — B369 Superficial mycosis, unspecified: Secondary | ICD-10-CM | POA: Insufficient documentation

## 2013-08-11 DIAGNOSIS — E1149 Type 2 diabetes mellitus with other diabetic neurological complication: Secondary | ICD-10-CM

## 2013-08-11 DIAGNOSIS — I872 Venous insufficiency (chronic) (peripheral): Secondary | ICD-10-CM

## 2013-08-11 DIAGNOSIS — I10 Essential (primary) hypertension: Secondary | ICD-10-CM

## 2013-08-11 MED ORDER — TRIAMCINOLONE 0.1 % CREAM:EUCERIN CREAM 1:1: 1.0000 "application " | TOPICAL_CREAM | Freq: Three times a day (TID) | CUTANEOUS | Status: DC

## 2013-08-11 NOTE — Progress Notes (Signed)
Patient ID: Debra [name], female   DOB: Apr 09, 1945, 68 y.o.   MRN: [id number]   Location:  [facility]  Code Status: full code  Allergies  Allergen Reactions  . Aspirin Nausea Only    And causes bruises  . Banana Hives  . Hydrocodone Hives  . Neomycin-Polymyxin-Gramicidin Itching and Swelling    Eye drops caused swelling in face and rash and itching on arms and neck  . Penicillins Hives and Itching  . Tramadol Nausea And Vomiting  . Latex Hives and Rash    Chief Complaint  Patient presents with  . Medical Management of Chronic Issues    6 week follow-up, ongoing leg concerns- sores on lower legs. Patient was unable to get recommended test due to coverage [employer])     HPI: Patient is a 68 y.o. white female seen in the office today for medical mgt of chronic diseases.  Exceeded her limit after her surgery for her insurance company (chole).  Arterial doppler was ordered last time.  They are to notify her when she is clear to get it done (to call her 6/1).    Review of Systems:  Review of Systems  Constitutional: Negative for fever and malaise/fatigue.  Eyes: Negative for blurred vision.  Respiratory: Negative for shortness of breath.   Cardiovascular: Positive for leg swelling. Negative for chest pain.  Gastrointestinal: Negative for abdominal pain, constipation, blood in stool and melena.  Genitourinary: Negative for dysuria.  Musculoskeletal: Positive for back pain. Negative for falls.       Foot pain  Skin: Positive for itching and rash.  Neurological: Negative for dizziness, loss of consciousness and headaches.  Endo/Heme/Allergies: Bruises/bleeds easily.  Psychiatric/Behavioral: Negative for depression and memory loss.    Past Medical History  Diagnosis Date  . Diabetes mellitus with neurological manifestation   . Hypertension goal BP (blood pressure) < 130/80   . Hyperlipidemia LDL goal < 100   . Osteopenia     DEXA  scan 7/07  . Chronic venous insufficiency   . Morbid obesity   . Diverticulitis     h/o 2-3 episodes in past  . Leg edema     secondary to chronic venous insufficiency  . Cellulitis of left leg     history of, most recent episode 01/09    Past Surgical History  Procedure Laterality Date  . Appendectomy    . Abdominal hysterectomy    . Colectomy      with diverting colsotmy and revision in the 1990's for diverticulitis  . Eye surgery  8/13    left cataract removal & retinal repair  . Cholecystectomy    . Cholecystectomy N/A 04/14/2013    Procedure: LAPAROSCOPIC CHOLECYSTECTOMY WITH INTRAOPERATIVE CHOLANGIOGRAM;  Surgeon: Adin Hector, MD;  Location: [facility];  Service: General;  Laterality: N/A;  . Laparoscopic lysis of adhesions N/A 04/14/2013    Procedure: LAPAROSCOPIC LYSIS OF ADHESIONS;  Surgeon: Adin Hector, MD;  Location: [facility];  Service: General;  Laterality: N/A;  . Ercp N/A 04/17/2013    Procedure: ENDOSCOPIC RETROGRADE CHOLANGIOPANCREATOGRAPHY (ERCP);  Surgeon: Irene Shipper, MD;  Location: [facility];  Service: Endoscopy;  Laterality: N/A;  note pt want general anesthesia for this case.  preferto perform in endo unit, but if unavailable please arrange time in OR    Social History:   reports that she has never smoked. She has never used smokeless tobacco. She reports that she does not drink alcohol  or use illicit drugs.  Family History  Problem Relation Age of Onset  . Heart disease Mother   . Heart disease Father   . Heart disease Brother   . Cancer Brother   . Cancer Brother   . Hypertension Sister   . Heart disease Sister     Medications: Patient's Medications  New Prescriptions   No medications on file  Previous Medications   ALLOPURINOL (ZYLOPRIM) 100 MG TABLET    Take 100 mg by mouth 2 (two) times daily. For Gout   ALOGLIPTIN-PIOGLITAZONE (OSENI) 12.5-30 MG TABS    Take 1 tablet by mouth daily.   ATORVASTATIN (LIPITOR) 20 MG TABLET    Take 1  tablet (20 mg total) by mouth daily.   DIPHENHYDRAMINE (BENADRYL) 25 MG TABLET    Take 2 tablets (50 mg total) by mouth every 6 (six) hours as needed for itching.   FUROSEMIDE (LASIX) 20 MG TABLET    Take 1 tablet (20 mg total) by mouth daily. For swelling   IBUPROFEN (ADVIL,MOTRIN) 200 MG TABLET    Take 400 mg by mouth every 6 (six) hours as needed for pain. For pain   LINAGLIPTIN (TRADJENTA) 5 MG TABS TABLET    Take 1 tablet (5 mg total) by mouth daily.   LISINOPRIL (PRINIVIL,ZESTRIL) 40 MG TABLET    Take 40 mg by mouth daily.   METFORMIN (GLUCOPHAGE) 1000 MG TABLET    TAKE 1 TABLET BY MOUTH TWICE DAILY WITH A MEAL   METOPROLOL SUCCINATE (TOPROL-XL) 25 MG 24 HR TABLET    Take 25 mg by mouth daily.   OMEPRAZOLE (PRILOSEC) 20 MG CAPSULE    TAKE ONE CAPSULE BY MOUTH DAILY   ONDANSETRON (ZOFRAN ODT) 4 MG DISINTEGRATING TABLET    Take 1 tablet (4 mg total) by mouth every 8 (eight) hours as needed for nausea or vomiting.   POTASSIUM CHLORIDE SA (K-DUR,KLOR-CON) 20 MEQ TABLET    Take 1 tablet (20 mEq total) by mouth daily. For potassium supplement   PREGABALIN (LYRICA) 75 MG CAPSULE    Take one capsule by mouth three times daily for pains  Modified Medications   No medications on file  Discontinued Medications   ALBUTEROL (PROVENTIL HFA;VENTOLIN HFA) 108 (90 BASE) MCG/ACT INHALER    Inhale 2 puffs into the lungs every 4 (four) hours as needed for wheezing or shortness of breath. For wheezing and SOB   Physical Exam: Filed Vitals:   08/11/13 0830  BP: 132/86  Pulse: 70  Temp: 98.4 F (36.9 C)  TempSrc: Oral  Weight: 244 lb 9.6 oz (110.95 kg)  SpO2: 97%  Physical Exam  Constitutional: She is oriented to person, place, and time.  Obese white female in NAD  HENT:  Head: Normocephalic and atraumatic.  Cardiovascular: Normal rate, regular rhythm, normal heart sounds and intact distal pulses.   Pulmonary/Chest: Breath sounds normal. No respiratory distress.  Abdominal: Soft. Bowel sounds are  normal. She exhibits no distension and no mass. There is no tenderness.  Musculoskeletal:  Wobbly gait, uses cane, has laterally deviated feet   Neurological: She is alert and oriented to person, place, and time.  Skin: Skin is warm and dry.    Labs reviewed: Basic Metabolic Panel:  Recent Labs  11/28/12 1054  04/14/13 0524  04/19/13 0442  05/03/13 1622 05/08/13 0359 06/09/13 1209 06/29/13 0540  NA 147*  < > 136*  < > 135*  < > 138 142 143 141  K 4.6  < > 3.9  < >  3.7  < > 4.5 3.5* 4.0 3.9  CL 102  < > 97  < > 93*  < > 96 99 97 100  CO2 25  < > 24  < > 30  < > 27  --  26 20  GLUCOSE 129*  < > 224*  < > 190*  < > 177* 179* 164* 160*  BUN 18  < > 14  < > 11  < > 20 24* 15 26*  CREATININE 0.91  < > 0.71  < > 0.72  < > 0.87 1.20* 0.81 1.03  CALCIUM 9.9  < > 9.0  < > 8.7  < > 9.8  --  9.3 9.3  MG  --   --  1.2*  --  1.5  --   --   --   --   --   TSH 3.630  --   --   --   --   --   --   --   --   --   < > = values in this interval not displayed. Liver Function Tests:  Recent Labs  04/15/13 0526 04/18/13 0531 04/19/13 0442 04/26/13 1133  AST 294* 130* 164* 21  ALT 518* 243* 244* 41*  ALKPHOS 152* 176* 194* 139*  BILITOT 1.0 5.3* 4.4* 0.9  PROT 6.5 6.4 6.1 6.8  ALBUMIN 3.2* 2.6* 2.5*  --     Recent Labs  01/19/13 1422  04/15/13 0526 04/18/13 0531 04/19/13 0442 04/26/13 1133  LIPASE 24  < >  --  497* 122* 84*  AMYLASE 52  --  43  --   --  58  < > = values in this interval not displayed. CBC:  Recent Labs  05/08/13 0324 05/08/13 0359 06/09/13 1209 06/29/13 0540  WBC 6.2  --  9.1 6.0  NEUTROABS 3.1  --  5.4 1.8  HGB 12.2 13.6 12.5 12.6  HCT 36.9 40.0 36.6 38.2  MCV 91.6  --  89 92.5  PLT 251  --  316 214   Lab Results  Component Value Date   HGBA1C 7.3* 06/09/2013    Assessment/Plan 1. 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  Dispense: 1 each; Refill: 3 - CBC With differential/Platelet; Future - Comprehensive metabolic panel; Future - Ambulatory referral to Podiatry  2. Chronic venous insufficiency -seems to be cause of her swelling, but arterial insufficiency must be ruled out especially in view of her possibly increased risk of homocysteinemia on her genetic testing results -cannot put on ted hose - CBC With differential/Platelet; Future - Comprehensive metabolic panel; Future - Ambulatory referral to Podiatry  3. Diabetes mellitus with neurological manifestation -fair control as of last hba1c and based on glucose readings she tells me - Comprehensive metabolic panel; Future - Hemoglobin A1c; Future - Ambulatory referral to Podiatry  4. Morbid obesity - does not exercise and cheats frequently on diet so it will be hard for her to lose -may need to increase her lyrica to control her leg pain better from neuropathy and that might help, but deformities of feet are limiting - Comprehensive metabolic panel; Future - Ambulatory  referral to Podiatry  5. Hypertension goal BP (blood pressure) < 130/80 -bp just above goal today, cont to monitor and increase medication if still high next time - Comprehensive metabolic panel; Future - Lipid panel; Future  6. Encounter for therapeutic drug monitoring -reviewed her genetic test results--avoid ibuprofen, benadryl use -be careful with hydrocodone due to potential side effects -also discussed importance of good diabetic, bp and lipid control due to potential for increased risk of CAD, PAD due to heterozygous genetic mutation that could cause hyperhomocysteinemia   Labs/tests ordered:   Orders Placed This Encounter  Procedures  . CBC With  differential/Platelet    Standing Status: Future     Number of Occurrences:      Standing Expiration Date: 11/11/2013  . Comprehensive metabolic panel    Standing Status: Future     Number of Occurrences:      Standing Expiration Date: 11/11/2013  . Hemoglobin A1c    Standing Status: Future     Number of Occurrences:      Standing Expiration Date: 11/11/2013  . Lipid panel    Standing Status: Future     Number of Occurrences:      Standing Expiration Date: 11/11/2013    Next appt:  6 wks, labs before

## 2013-08-28 ENCOUNTER — Other Ambulatory Visit: Payer: Self-pay | Admitting: Internal Medicine

## 2013-08-29 ENCOUNTER — Other Ambulatory Visit: Payer: Self-pay | Admitting: *Deleted

## 2013-08-29 ENCOUNTER — Encounter: Payer: Self-pay | Admitting: Internal Medicine

## 2013-09-05 ENCOUNTER — Ambulatory Visit (INDEPENDENT_AMBULATORY_CARE_PROVIDER_SITE_OTHER): Payer: Commercial Managed Care - HMO

## 2013-09-05 VITALS — BP 145/81 | HR 64 | Resp 16 | Ht 64.0 in | Wt 225.0 lb

## 2013-09-05 DIAGNOSIS — R609 Edema, unspecified: Secondary | ICD-10-CM

## 2013-09-05 DIAGNOSIS — E1149 Type 2 diabetes mellitus with other diabetic neurological complication: Secondary | ICD-10-CM

## 2013-09-05 DIAGNOSIS — E1341 Other specified diabetes mellitus with diabetic mononeuropathy: Secondary | ICD-10-CM

## 2013-09-05 DIAGNOSIS — I8310 Varicose veins of unspecified lower extremity with inflammation: Secondary | ICD-10-CM

## 2013-09-05 DIAGNOSIS — E1140 Type 2 diabetes mellitus with diabetic neuropathy, unspecified: Secondary | ICD-10-CM

## 2013-09-05 DIAGNOSIS — E1142 Type 2 diabetes mellitus with diabetic polyneuropathy: Secondary | ICD-10-CM

## 2013-09-05 DIAGNOSIS — Q828 Other specified congenital malformations of skin: Secondary | ICD-10-CM

## 2013-09-05 DIAGNOSIS — I872 Venous insufficiency (chronic) (peripheral): Secondary | ICD-10-CM

## 2013-09-05 DIAGNOSIS — E1349 Other specified diabetes mellitus with other diabetic neurological complication: Secondary | ICD-10-CM

## 2013-09-05 NOTE — Progress Notes (Signed)
   Subjective:    Patient ID: Debra [name], female    DOB: 03-Jul-1945, 68 y.o.   MRN: [id number]  HPI Comments: N debridement L B/L 1st medial toenails, and B/L heels D and O long-term C thickened hard skin, and fissure to left heel A diabetes, difficulty trimming T Dr. Hollace Kinnier referred.  Pt states she trims her own toenails.     Review of Systems  Musculoskeletal: Positive for gait problem.  All other systems reviewed and are negative.      Objective:   Physical Exam 68 year old well-developed well-nourished oriented female oriented x3 presents this time with a complaint of burning and stinging tingling pain are fully patient is a history of diabetes proxy 2 year history of diabetes non-insulin-dependent patient also significant look straight peripheral edema with some resolving venous stasis dermatitis apparent on the right lower leg. No active wounds or ulcerations are currently noted  Lower extremity objective findings as follows vascular status is intact pedal pulses palpable DP plus over 4 PT plus one over 4 there is plus one +2 edema bilateral capillary refill time 3 seconds all digits. Again significant edema noted with posse venous stasis dermatitis of right leg Neurologically epicritic sensations are diminished on [employer] testing to forefoot and arch and plantar heel intact sensation of dorsum of the foot and ankle and absent sensation in the anterior shins bilateral there is normal plantar response DTRs not elicited. Again on [employer] testing multiple spots were missed on the forefoot including the intact sensation of the digits absent sensations at the method that and absent sensations of the heels bilateral.  Orthopedic biomechanical exam reveals rectus foot type mild flexible digital contractures 2 through 5 bilateral no significant bunion deformities noted       Assessment & Plan:  Assessment this time his diabetes with some history of peripheral  neuropathy and diminished epicritic and proprioceptive sensations being noted patient also may have some angiopathy however significant for venous stasis and venous insufficiency and edema with patient currently wearing a diuretic medications also does have burning stinging tingling sensations in both feet consistent with diabetic neuropathy she has been taking Lyrica 3 times daily for that medication her up to 2 months to patient is a pinch callus of left great toe secondary to rigid digital contracture and some hammering and slight HAV deformity left more so than right and hammertoe contractures lesser digits 2 through 5 this time single keratotic lesion is debrided patient's husband has been fighting nail care doing the job cutting her nails and likely continue to do so. May be candidate for future diabetic foot and palliative nail care on an as-needed basis for this time patient is wearing a pair slip on shoes which are way contact her narrowing her feet is a likely also causing compression neuropathy and forefoot her neuroma type symptomology since they are non-expansile type shoe recommendation at this time would be for diabetic shoe with a Velcro wrap lasts is another diabetic type inlays we'll obtain authorization through primary physician and is managing her diabetes at this time for diabetic shoes and custom insoles the future. This may benefit her gait that fit her stability and prevent further breakdown the foot also suggested following up with compression stockings to help with the venous stasis and insufficiency. Patient be recheck within 1 month once authorizations obtained for shoes next  Harriet Masson DPM

## 2013-09-05 NOTE — Patient Instructions (Signed)
Diabetes and Foot Care Diabetes may cause you to have problems because of poor blood supply (circulation) to your feet and legs. This may cause the skin on your feet to become thinner, break easier, and heal more slowly. Your skin may become dry, and the skin may peel and crack. You may also have nerve damage in your legs and feet causing decreased feeling in them. You may not notice minor injuries to your feet that could lead to infections or more serious problems. Taking care of your feet is one of the most important things you can do for yourself.  HOME CARE INSTRUCTIONS  Wear shoes at all times, even in the house. Do not go barefoot. Bare feet are easily injured.  Check your feet daily for blisters, cuts, and redness. If you cannot see the bottom of your feet, use a mirror or ask someone for help.  Wash your feet with warm water (do not use hot water) and mild soap. Then pat your feet and the areas between your toes until they are completely dry. Do not soak your feet as this can dry your skin.  Apply a moisturizing lotion or petroleum jelly (that does not contain alcohol and is unscented) to the skin on your feet and to dry, brittle toenails. Do not apply lotion between your toes.  Trim your toenails straight across. Do not dig under them or around the cuticle. File the edges of your nails with an emery board or nail file.  Do not cut corns or calluses or try to remove them with medicine.  Wear clean socks or stockings every day. Make sure they are not too tight. Do not wear knee-high stockings since they may decrease blood flow to your legs.  Wear shoes that fit properly and have enough cushioning. To break in new shoes, wear them for just a few hours a day. This prevents you from injuring your feet. Always look in your shoes before you put them on to be sure there are no objects inside.  Do not cross your legs. This may decrease the blood flow to your feet.  If you find a minor scrape,  cut, or break in the skin on your feet, keep it and the skin around it clean and dry. These areas may be cleansed with mild soap and water. Do not cleanse the area with peroxide, alcohol, or iodine.  When you remove an adhesive bandage, be sure not to damage the skin around it.  If you have a wound, look at it several times a day to make sure it is healing.  Do not use heating pads or hot water bottles. They may burn your skin. If you have lost feeling in your feet or legs, you may not know it is happening until it is too late.  Make sure your health care Debra [name] performs a complete foot exam at least annually or more often if you have foot problems. Report any cuts, sores, or bruises to your health care Debra [name] immediately. SEEK MEDICAL CARE IF:   You have an injury that is not healing.  You have cuts or breaks in the skin.  You have an ingrown nail.  You notice redness on your legs or feet.  You feel burning or tingling in your legs or feet.  You have pain or cramps in your legs and feet.  Your legs or feet are numb.  Your feet always feel cold. SEEK IMMEDIATE MEDICAL CARE IF:   There is increasing redness,   swelling, or pain in or around a wound.  There is a red line that goes up your leg.  Pus is coming from a wound.  You develop a fever or as directed by your health care Debra [name].  You notice a bad smell coming from an ulcer or wound. Document Released: 03/06/2000 Document Revised: 11/09/2012 Document Reviewed: 08/16/2012 [employer] Patient Information 2014 [employer]  

## 2013-09-20 ENCOUNTER — Other Ambulatory Visit: Payer: Self-pay | Admitting: Internal Medicine

## 2013-09-25 ENCOUNTER — Other Ambulatory Visit: Payer: Self-pay | Admitting: Internal Medicine

## 2013-10-03 ENCOUNTER — Other Ambulatory Visit: Payer: Commercial Managed Care - HMO

## 2013-10-03 DIAGNOSIS — E1149 Type 2 diabetes mellitus with other diabetic neurological complication: Secondary | ICD-10-CM

## 2013-10-03 DIAGNOSIS — B369 Superficial mycosis, unspecified: Secondary | ICD-10-CM

## 2013-10-03 DIAGNOSIS — I10 Essential (primary) hypertension: Secondary | ICD-10-CM

## 2013-10-03 DIAGNOSIS — I872 Venous insufficiency (chronic) (peripheral): Secondary | ICD-10-CM

## 2013-10-04 LAB — CBC WITH DIFFERENTIAL
Basophils Absolute: 0 10*3/uL (ref 0.0–0.2)
Basos: 1 %
Eos: 7 %
Eosinophils Absolute: 0.5 10*3/uL — ABNORMAL HIGH (ref 0.0–0.4)
HCT: 40.8 % (ref 34.0–46.6)
Hemoglobin: 13.7 g/dL (ref 11.1–15.9)
Immature Grans (Abs): 0 10*3/uL (ref 0.0–0.1)
Immature Granulocytes: 0 %
Lymphocytes Absolute: 2.4 10*3/uL (ref 0.7–3.1)
Lymphs: 32 %
MCH: 29.9 pg (ref 26.6–33.0)
MCHC: 33.6 g/dL (ref 31.5–35.7)
MCV: 89 fL (ref 79–97)
Monocytes Absolute: 0.5 10*3/uL (ref 0.1–0.9)
Monocytes: 6 %
Neutrophils Absolute: 4.1 10*3/uL (ref 1.4–7.0)
Neutrophils Relative %: 54 %
Platelets: 275 10*3/uL (ref 150–379)
RBC: 4.58 x10E6/uL (ref 3.77–5.28)
RDW: 15.4 % (ref 12.3–15.4)
WBC: 7.5 10*3/uL (ref 3.4–10.8)

## 2013-10-04 LAB — COMPREHENSIVE METABOLIC PANEL
ALT: 21 IU/L (ref 0–32)
AST: 17 IU/L (ref 0–40)
Albumin/Globulin Ratio: 2 (ref 1.1–2.5)
Albumin: 4.3 g/dL (ref 3.6–4.8)
Alkaline Phosphatase: 76 IU/L (ref 39–117)
BUN/Creatinine Ratio: 20 (ref 11–26)
BUN: 15 mg/dL (ref 8–27)
CO2: 24 mmol/L (ref 18–29)
Calcium: 9.6 mg/dL (ref 8.7–10.3)
Chloride: 100 mmol/L (ref 97–108)
Creatinine, Ser: 0.75 mg/dL (ref 0.57–1.00)
GFR calc Af Amer: 96 mL/min/{1.73_m2} (ref >59)
GFR calc non Af Amer: 83 mL/min/{1.73_m2} (ref >59)
Globulin, Total: 2.2 g/dL (ref 1.5–4.5)
Glucose: 148 mg/dL — ABNORMAL HIGH (ref 65–99)
Potassium: 4.6 mmol/L (ref 3.5–5.2)
Sodium: 141 mmol/L (ref 134–144)
Total Bilirubin: 0.4 mg/dL (ref 0.0–1.2)
Total Protein: 6.5 g/dL (ref 6.0–8.5)

## 2013-10-04 LAB — HEMOGLOBIN A1C
Est. average glucose Bld gHb Est-mCnc: 154 mg/dL
Hgb A1c MFr Bld: 7 % — ABNORMAL HIGH (ref 4.8–5.6)

## 2013-10-04 LAB — LIPID PANEL
Chol/HDL Ratio: 3.4 ratio units (ref 0.0–4.4)
Cholesterol, Total: 155 mg/dL (ref 100–199)
HDL: 45 mg/dL (ref >39)
LDL Calculated: 85 mg/dL (ref 0–99)
Triglycerides: 123 mg/dL (ref 0–149)
VLDL Cholesterol Cal: 25 mg/dL (ref 5–40)

## 2013-10-06 ENCOUNTER — Encounter: Payer: Self-pay | Admitting: Internal Medicine

## 2013-10-06 ENCOUNTER — Ambulatory Visit (INDEPENDENT_AMBULATORY_CARE_PROVIDER_SITE_OTHER): Payer: Commercial Managed Care - HMO | Admitting: Internal Medicine

## 2013-10-06 VITALS — BP 134/76 | HR 68 | Temp 97.4°F | Wt 267.0 lb

## 2013-10-06 DIAGNOSIS — E1169 Type 2 diabetes mellitus with other specified complication: Secondary | ICD-10-CM

## 2013-10-06 DIAGNOSIS — R350 Frequency of micturition: Secondary | ICD-10-CM

## 2013-10-06 DIAGNOSIS — I10 Essential (primary) hypertension: Secondary | ICD-10-CM

## 2013-10-06 DIAGNOSIS — E1149 Type 2 diabetes mellitus with other diabetic neurological complication: Secondary | ICD-10-CM

## 2013-10-06 DIAGNOSIS — I872 Venous insufficiency (chronic) (peripheral): Secondary | ICD-10-CM

## 2013-10-06 DIAGNOSIS — E1142 Type 2 diabetes mellitus with diabetic polyneuropathy: Secondary | ICD-10-CM

## 2013-10-06 DIAGNOSIS — R609 Edema, unspecified: Secondary | ICD-10-CM

## 2013-10-06 DIAGNOSIS — E785 Hyperlipidemia, unspecified: Secondary | ICD-10-CM

## 2013-10-06 LAB — POCT URINALYSIS DIPSTICK
Bilirubin, UA: NEGATIVE
Blood, UA: NEGATIVE
Glucose, UA: NEGATIVE
Ketones, UA: NEGATIVE
Leukocytes, UA: NEGATIVE
Nitrite, UA: NEGATIVE
Protein, UA: NEGATIVE
Spec Grav, UA: 1.025
Urobilinogen, UA: 0.2
pH, UA: 6

## 2013-10-06 MED ORDER — FUROSEMIDE 20 MG PO TABS: 20.0000 mg | ORAL_TABLET | Freq: Every day | ORAL | Status: DC

## 2013-10-06 NOTE — Progress Notes (Signed)
Patient ID: Debra [name], female   DOB: 1946/01/21, 68 y.o.   MRN: [id number]   Location:  [facility]  Code Status: full code, advance directive info given today  Allergies  Allergen Reactions  . Aspirin Nausea Only    And causes bruises  . Banana Hives  . Hydrocodone Hives  . Neomycin-Polymyxin-Gramicidin Itching and Swelling    Eye drops caused swelling in face and rash and itching on arms and neck  . Penicillins Hives and Itching  . Tramadol Nausea And Vomiting  . Latex Hives and Rash    Chief Complaint  Patient presents with  . Follow-up    6 week f/u & discuss labs (printed)  . other    frequent urination- 6 times at night    HPI: Patient is a 68 y.o. white female seen in the office today for   Right leg is doing better.  Left still has some open areas limiting her ability to wear her compression hose which whe did get.  Is elevating them.  Pain is a lot better.    She saw Dr. Blenda Mounts 09/05/13--he debrided her nails.  She says he debrided her callous.    Has not slept well due to getting up to urinate.  Avoiding salt and doesn't drink any fluids after 9pm--goes to bed b/w 10-11pm.  Was up 5-6x.  Not burning.  After finishes peeing, feels like she has to go again when goes to get up.    I did the authorization for her diabetic shoes that was sent here from podiatry.    Reviewed labs--much improved overall.    Review of Systems:  Review of Systems  Constitutional: Negative for fever, chills and weight loss.  HENT: Negative for congestion.   Eyes: Negative for blurred vision.  Respiratory: Negative for shortness of breath.   Cardiovascular: Positive for leg swelling. Negative for chest pain.  Gastrointestinal: Negative for constipation, blood in stool and melena.  Genitourinary: Positive for frequency. Negative for dysuria and urgency.  Musculoskeletal: Negative for falls.  Skin: Negative for rash.       Redness and slight  blistering left lower leg, chronic venous stasis bilaterally  Neurological: Negative for dizziness.  Psychiatric/Behavioral: Negative for memory loss.    Past Medical History  Diagnosis Date  . Diabetes mellitus with neurological manifestation   . Hypertension goal BP (blood pressure) < 130/80   . Hyperlipidemia LDL goal < 100   . Osteopenia     DEXA scan 7/07  . Chronic venous insufficiency   . Morbid obesity   . Diverticulitis     h/o 2-3 episodes in past  . Leg edema     secondary to chronic venous insufficiency  . Cellulitis of left leg     history of, most recent episode 01/09    Past Surgical History  Procedure Laterality Date  . Appendectomy    . Abdominal hysterectomy    . Colectomy      with diverting colsotmy and revision in the 1990's for diverticulitis  . Eye surgery  8/13    left cataract removal & retinal repair  . Cholecystectomy    . Cholecystectomy N/A 04/14/2013    Procedure: LAPAROSCOPIC CHOLECYSTECTOMY WITH INTRAOPERATIVE CHOLANGIOGRAM;  Surgeon: Adin Hector, MD;  Location: [facility];  Service: General;  Laterality: N/A;  . Laparoscopic lysis of adhesions N/A 04/14/2013    Procedure: LAPAROSCOPIC LYSIS OF ADHESIONS;  Surgeon: Adin Hector, MD;  Location:  [facility];  Service: General;  Laterality: N/A;  . Ercp N/A 04/17/2013    Procedure: ENDOSCOPIC RETROGRADE CHOLANGIOPANCREATOGRAPHY (ERCP);  Surgeon: Irene Shipper, MD;  Location: [facility];  Service: Endoscopy;  Laterality: N/A;  note pt want general anesthesia for this case.  preferto perform in endo unit, but if unavailable please arrange time in OR    Social History:   reports that she has never smoked. She has never used smokeless tobacco. She reports that she does not drink alcohol or use illicit drugs.  Family History  Problem Relation Age of Onset  . Heart disease Mother   . Heart disease Father   . Heart disease Brother   . Cancer Brother   . Cancer Brother   . Hypertension Sister   .  Heart disease Sister     Medications: Patient's Medications  New Prescriptions   No medications on file  Previous Medications   ATORVASTATIN (LIPITOR) 20 MG TABLET    Take 1 tablet (20 mg total) by mouth daily.   DIPHENHYDRAMINE (BENADRYL) 25 MG TABLET    Take 2 tablets (50 mg total) by mouth every 6 (six) hours as needed for itching.   FUROSEMIDE (LASIX) 20 MG TABLET    Take 1 tablet (20 mg total) by mouth daily. For swelling   LINAGLIPTIN (TRADJENTA) 5 MG TABS TABLET    Take 1 tablet (5 mg total) by mouth daily.   LISINOPRIL (PRINIVIL,ZESTRIL) 40 MG TABLET    Take 40 mg by mouth daily.   LYRICA 75 MG CAPSULE    TAKE ONE CAPSULE BY MOUTH THREE TIMES DAILY   METFORMIN (GLUCOPHAGE) 1000 MG TABLET    TAKE 1 TABLET BY MOUTH TWICE DAILY WITH A MEAL   METOPROLOL SUCCINATE (TOPROL-XL) 25 MG 24 HR TABLET    Take 25 mg by mouth daily.   OMEPRAZOLE (PRILOSEC) 20 MG CAPSULE    TAKE ONE CAPSULE BY MOUTH DAILY   ONDANSETRON (ZOFRAN ODT) 4 MG DISINTEGRATING TABLET    Take 1 tablet (4 mg total) by mouth every 8 (eight) hours as needed for nausea or vomiting.   POTASSIUM CHLORIDE SA (K-DUR,KLOR-CON) 20 MEQ TABLET    TAKE 1 TABLET BY MOUTH EVERY DAY   TRIAMCINOLONE ACETONIDE (TRIAMCINOLONE 0.1 % CREAM : EUCERIN) CREA    Apply 1 application topically 3 (three) times daily.  Modified Medications   No medications on file  Discontinued Medications   No medications on file     Physical Exam: Filed Vitals:   10/06/13 1131  BP: 134/76  Pulse: 68  Temp: 97.4 F (36.3 C)  TempSrc: Oral  Weight: 267 lb (121.11 kg)  SpO2: 94%  Physical Exam  Constitutional: She is oriented to person, place, and time.  Cardiovascular: Normal rate, regular rhythm, normal heart sounds and intact distal pulses.   Pulmonary/Chest: Effort normal and breath sounds normal.  Abdominal: Soft. Bowel sounds are normal. She exhibits no distension and no mass. There is no tenderness.  Musculoskeletal: Normal range of motion.    Neurological: She is alert and oriented to person, place, and time.  Skin:  See ros;  Also dry erythematous patch on posterior upper arm  Psychiatric: She has a normal mood and affect.    Labs reviewed: Basic Metabolic Panel:  Recent Labs  11/28/12 1054  04/14/13 0524  04/19/13 0442  06/09/13 1209 06/29/13 0540 10/03/13 0920  NA 147*  < > 136*  < > 135*  < > 143 141 141  K 4.6  < >  3.9  < > 3.7  < > 4.0 3.9 4.6  CL 102  < > 97  < > 93*  < > 97 100 100  CO2 25  < > 24  < > 30  < > 26 20 24   GLUCOSE 129*  < > 224*  < > 190*  < > 164* 160* 148*  BUN 18  < > 14  < > 11  < > 15 26* 15  CREATININE 0.91  < > 0.71  < > 0.72  < > 0.81 1.03 0.75  CALCIUM 9.9  < > 9.0  < > 8.7  < > 9.3 9.3 9.6  MG  --   --  1.2*  --  1.5  --   --   --   --   TSH 3.630  --   --   --   --   --   --   --   --   < > = values in this interval not displayed. Liver Function Tests:  Recent Labs  04/15/13 0526 04/18/13 0531 04/19/13 0442 04/26/13 1133 10/03/13 0920  AST 294* 130* 164* 21 17  ALT 518* 243* 244* 41* 21  ALKPHOS 152* 176* 194* 139* 76  BILITOT 1.0 5.3* 4.4* 0.9 0.4  PROT 6.5 6.4 6.1 6.8 6.5  ALBUMIN 3.2* 2.6* 2.5*  --   --     Recent Labs  01/19/13 1422  04/15/13 0526 04/18/13 0531 04/19/13 0442 04/26/13 1133  LIPASE 24  < >  --  497* 122* 84*  AMYLASE 52  --  43  --   --  58  < > = values in this interval not displayed. No results found for this basename: AMMONIA,  in the last 8760 hours CBC:  Recent Labs  06/09/13 1209 06/29/13 0540 10/03/13 0920  WBC 9.1 6.0 7.5  NEUTROABS 5.4 1.8 4.1  HGB 12.5 12.6 13.7  HCT 36.6 38.2 40.8  MCV 89 92.5 89  PLT 316 214 275   Lipid Panel:  Recent Labs  10/03/13 0920  HDL 45  LDLCALC 85  TRIG 123  CHOLHDL 3.4   Lab Results  Component Value Date   HGBA1C 7.0* 10/03/2013    Assessment/Plan 1. Urinary frequency -urine dipstick done which actually only appeared concentrated -unclear why she is having frequency -culture  was sent anyway due to her symptoms  2. Chronic venous insufficiency -advised she needs to use her compression hose and cont to elevate her feet, cont lasix  3. Type 2 diabetes mellitus with diabetic polyneuropathy -control improving with changes to her diet as I've recommended -no med changes -has also now seen podiatry -need to check on eye visits next time  4. Morbid obesity -eating a bit healthier--needs more exercise--counseled on this  5. Edema - furosemide (LASIX) 20 MG tablet; Take 1 tablet (20 mg total) by mouth daily. For swelling  Dispense: 30 tablet; Refill: 3 and potassium  6. Hyperlipidemia associated with type 2 diabetes mellitus - cont statin therapy  7. Hypertension goal BP (blood pressure) < 130/80 -cont metoprolol, lisinopril, lasix -asked her to check her bp daily and write it down and bring to next visit  Labs/tests ordered:   Orders Placed This Encounter  Procedures  . Urinalysis, manual only    Standing Status: Standing     Number of Occurrences: 1     Standing Expiration Date:   . Basic metabolic panel    Standing Status: Future  Number of Occurrences:      Standing Expiration Date: 04/08/2014  . Hemoglobin A1c    Standing Status: Future     Number of Occurrences:      Standing Expiration Date: 04/08/2014    Next appt:  3 mos, labs before

## 2013-10-06 NOTE — Patient Instructions (Signed)
BP just a little above goal of <130/80.  Please check your blood pressure daily at home 30 mins after you've had your morning medicines.

## 2013-10-07 LAB — URINE CULTURE

## 2013-10-16 ENCOUNTER — Other Ambulatory Visit: Payer: Self-pay | Admitting: Internal Medicine

## 2013-10-19 ENCOUNTER — Other Ambulatory Visit: Payer: Self-pay | Admitting: Internal Medicine

## 2013-10-20 ENCOUNTER — Telehealth: Payer: Self-pay | Admitting: *Deleted

## 2013-10-20 NOTE — Telephone Encounter (Signed)
She should be seen.  Is it in her breast or chest?  Does it hurt to touch?  Does it radiate?  Any associated symptoms?  We need to be sure she is not having a heart attack.  If it's her breast, she needs an eval and imaging.  At this point, she will need to go to an urgent care or hospital.

## 2013-10-20 NOTE — Telephone Encounter (Signed)
Spoke with Dr. Mariea Clonts and she stated for patient to go ahead and go to Urgent [place] to be evaluated just in case it is cardiac related. Patient Notified and Agreed.

## 2013-10-20 NOTE — Telephone Encounter (Signed)
Pt is having a pain in the LT breast area off/on x 1 week.  She is concerned and wants to know what she needs to do? Please advise.  Best number to reach @ [phone].

## 2013-10-20 NOTE — Telephone Encounter (Signed)
Patient of Dr Mariea Clonts. Please forward it to her

## 2013-10-29 ENCOUNTER — Other Ambulatory Visit: Payer: Self-pay | Admitting: Internal Medicine

## 2013-10-30 ENCOUNTER — Telehealth: Payer: Self-pay | Admitting: *Deleted

## 2013-10-30 NOTE — Telephone Encounter (Signed)
Patient called and stated that she was having some BP changes. She states one day it is up and one day it is down. She has a BP machine but she just don't understand why it varies from day to day. Patient feels fine. Today it was 102/86. She took her medications anyway, but wanted you to know. Please Advise.

## 2013-10-31 NOTE — Telephone Encounter (Signed)
LMOM to return call.

## 2013-10-31 NOTE — Telephone Encounter (Signed)
Let's have her come in to see Janett Billow for a bp check visit and tell her to please bring her machine, as well.  We probably need to calibrate it vs. Add hold parameters for her medications if her bp is truly running low.

## 2013-11-02 ENCOUNTER — Other Ambulatory Visit: Payer: Self-pay | Admitting: Internal Medicine

## 2013-11-14 ENCOUNTER — Other Ambulatory Visit: Payer: Self-pay | Admitting: *Deleted

## 2013-11-14 ENCOUNTER — Other Ambulatory Visit: Payer: Self-pay | Admitting: Internal Medicine

## 2013-11-14 MED ORDER — METOPROLOL SUCCINATE ER 25 MG PO TB24: ORAL_TABLET | ORAL | Status: DC

## 2013-11-14 NOTE — Telephone Encounter (Signed)
[facility] 

## 2013-11-17 ENCOUNTER — Other Ambulatory Visit: Payer: Self-pay | Admitting: Internal Medicine

## 2013-11-17 ENCOUNTER — Other Ambulatory Visit: Payer: Self-pay | Admitting: Nurse Practitioner

## 2013-12-17 ENCOUNTER — Other Ambulatory Visit: Payer: Self-pay | Admitting: Adult Health

## 2013-12-23 ENCOUNTER — Other Ambulatory Visit: Payer: Self-pay | Admitting: Nurse Practitioner

## 2013-12-27 ENCOUNTER — Other Ambulatory Visit: Payer: Self-pay | Admitting: *Deleted

## 2013-12-27 MED ORDER — ACCU-CHEK AVIVA DEVI: Status: AC

## 2013-12-27 NOTE — Telephone Encounter (Signed)
Patient Requested 

## 2014-01-01 ENCOUNTER — Ambulatory Visit: Payer: Self-pay | Admitting: Internal Medicine

## 2014-01-07 ENCOUNTER — Other Ambulatory Visit: Payer: Self-pay | Admitting: Internal Medicine

## 2014-01-15 ENCOUNTER — Encounter: Payer: Self-pay | Admitting: Internal Medicine

## 2014-01-15 ENCOUNTER — Ambulatory Visit (INDEPENDENT_AMBULATORY_CARE_PROVIDER_SITE_OTHER): Payer: Commercial Managed Care - HMO | Admitting: Internal Medicine

## 2014-01-15 VITALS — BP 156/88 | HR 51 | Temp 98.1°F | Resp 10 | Ht 64.0 in | Wt 265.0 lb

## 2014-01-15 DIAGNOSIS — R350 Frequency of micturition: Secondary | ICD-10-CM

## 2014-01-15 DIAGNOSIS — E1142 Type 2 diabetes mellitus with diabetic polyneuropathy: Secondary | ICD-10-CM

## 2014-01-15 DIAGNOSIS — D235 Other benign neoplasm of skin of trunk: Secondary | ICD-10-CM

## 2014-01-15 DIAGNOSIS — E1169 Type 2 diabetes mellitus with other specified complication: Secondary | ICD-10-CM

## 2014-01-15 DIAGNOSIS — E785 Hyperlipidemia, unspecified: Secondary | ICD-10-CM

## 2014-01-15 DIAGNOSIS — R609 Edema, unspecified: Secondary | ICD-10-CM

## 2014-01-15 DIAGNOSIS — D225 Melanocytic nevi of trunk: Secondary | ICD-10-CM

## 2014-01-15 DIAGNOSIS — I872 Venous insufficiency (chronic) (peripheral): Secondary | ICD-10-CM

## 2014-01-15 DIAGNOSIS — I10 Essential (primary) hypertension: Secondary | ICD-10-CM

## 2014-01-15 DIAGNOSIS — R300 Dysuria: Secondary | ICD-10-CM

## 2014-01-15 LAB — POCT URINALYSIS DIPSTICK
Bilirubin, UA: NEGATIVE
Glucose, UA: NEGATIVE
Ketones, UA: NEGATIVE
Nitrite, UA: NEGATIVE
Protein, UA: NEGATIVE
Spec Grav, UA: 1.01
Urobilinogen, UA: 0.2
pH, UA: 8

## 2014-01-15 MED ORDER — METOPROLOL SUCCINATE ER 50 MG PO TB24: ORAL_TABLET | ORAL | Status: DC

## 2014-01-15 NOTE — Patient Instructions (Signed)
Increase metoprolol (toprol xl) to 50mg  daily.  you may use 2 of your 25mg  pills  Let me know if you are still getting more little red dots.

## 2014-01-15 NOTE — Addendum Note (Signed)
Addended by: Logan Bores on: 01/15/2014 11:13 AM   Modules accepted: Orders
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
## 2014-01-15 NOTE — Addendum Note (Signed)
Addended by: Jearld Adjutant on: 01/15/2014 11:26 AM   Modules accepted: Orders

## 2014-01-16 ENCOUNTER — Encounter: Payer: Self-pay | Admitting: *Deleted

## 2014-01-16 LAB — HEMOGLOBIN A1C
Est. average glucose Bld gHb Est-mCnc: 151 mg/dL
Hgb A1c MFr Bld: 6.9 % — ABNORMAL HIGH (ref 4.8–5.6)

## 2014-01-16 LAB — BASIC METABOLIC PANEL
BUN/Creatinine Ratio: 21 (ref 11–26)
BUN: 15 mg/dL (ref 8–27)
CO2: 26 mmol/L (ref 18–29)
Calcium: 9.1 mg/dL (ref 8.7–10.3)
Chloride: 100 mmol/L (ref 97–108)
Creatinine, Ser: 0.73 mg/dL (ref 0.57–1.00)
GFR calc Af Amer: 99 mL/min/{1.73_m2} (ref >59)
GFR calc non Af Amer: 85 mL/min/{1.73_m2} (ref >59)
Glucose: 135 mg/dL — ABNORMAL HIGH (ref 65–99)
Potassium: 4.3 mmol/L (ref 3.5–5.2)
Sodium: 142 mmol/L (ref 134–144)

## 2014-01-17 ENCOUNTER — Other Ambulatory Visit: Payer: Self-pay | Admitting: Internal Medicine

## 2014-01-17 ENCOUNTER — Telehealth: Payer: Self-pay

## 2014-01-17 LAB — URINE CULTURE

## 2014-01-17 MED ORDER — CIPROFLOXACIN HCL 500 MG PO TABS: 500.0000 mg | ORAL_TABLET | Freq: Two times a day (BID) | ORAL | Status: DC

## 2014-01-17 NOTE — Telephone Encounter (Signed)
Left message on voicemail for patient to return call when available   

## 2014-01-17 NOTE — Telephone Encounter (Signed)
Message copied by Logan Bores on Wed Jan 17, 2014 10:43 AM ------      Message from: [place], [place] L      Created: Wed Jan 17, 2014 10:15 AM       Encourage her to continue water for hydration.  Her urine culture was positive as expected for UTI.  Will treat with cipro 500mg  po bid for 7 days due to her diabetes.  She should eat yogurt daily while taking the antibiotic to present a yeast infection and diarrhea. ------

## 2014-01-17 NOTE — Telephone Encounter (Signed)
Spoke with patient, patient verbalized understanding of results. RX sent to pharmacy

## 2014-01-22 ENCOUNTER — Other Ambulatory Visit: Payer: Self-pay | Admitting: Internal Medicine

## 2014-01-25 ENCOUNTER — Telehealth: Payer: Self-pay | Admitting: *Deleted

## 2014-01-25 NOTE — Telephone Encounter (Signed)
Let's refill the cream.  If the blisters worsen, she'll need to be seen next week.

## 2014-01-25 NOTE — Telephone Encounter (Signed)
LMOM to return call.   Patient stated that you gave her the Rx and it had to special mixed at the drugstore. It was Taco 1% Crmw/eucerin 1.1, you prescribed it on 08/11/13. Patient states that she elevates her legs and does not eat salt. Please Advise.

## 2014-01-25 NOTE — Telephone Encounter (Signed)
Which doctor gave her this cream?  I would suggest triamcinolone/cetaphil compounded cream.  Is that what she has?  She needs to elevate those legs.  Avoid high sodium foods and adding salt to her food and not drink more than 2 liters of fluid in a day.

## 2014-01-25 NOTE — Telephone Encounter (Signed)
Dr. Hanley Seamen her a cream to put on both legs. It did clear them up but now she is developing blisters that are not painful but are busting with water. And there is one that busted and now looks like its trying to form a sore. Should she continue the cream or stop it? What could be causing? Please Advise.

## 2014-01-26 NOTE — Telephone Encounter (Signed)
Patient Notified and agreed. 

## 2014-02-13 ENCOUNTER — Ambulatory Visit: Payer: Commercial Managed Care - HMO

## 2014-02-15 ENCOUNTER — Other Ambulatory Visit: Payer: Self-pay | Admitting: Internal Medicine

## 2014-02-18 ENCOUNTER — Other Ambulatory Visit: Payer: Self-pay | Admitting: Nurse Practitioner

## 2014-02-22 ENCOUNTER — Ambulatory Visit (INDEPENDENT_AMBULATORY_CARE_PROVIDER_SITE_OTHER): Payer: Commercial Managed Care - HMO

## 2014-02-22 DIAGNOSIS — Z23 Encounter for immunization: Secondary | ICD-10-CM

## 2014-04-09 ENCOUNTER — Other Ambulatory Visit: Payer: Self-pay | Admitting: Internal Medicine

## 2014-04-19 ENCOUNTER — Ambulatory Visit: Payer: Commercial Managed Care - HMO | Admitting: Internal Medicine

## 2014-04-30 ENCOUNTER — Other Ambulatory Visit: Payer: Self-pay | Admitting: Internal Medicine

## 2014-05-03 ENCOUNTER — Other Ambulatory Visit: Payer: Self-pay | Admitting: Internal Medicine

## 2014-05-04 ENCOUNTER — Other Ambulatory Visit: Payer: Self-pay | Admitting: Internal Medicine

## 2014-05-11 ENCOUNTER — Ambulatory Visit (INDEPENDENT_AMBULATORY_CARE_PROVIDER_SITE_OTHER): Payer: Commercial Managed Care - HMO | Admitting: Internal Medicine

## 2014-05-11 ENCOUNTER — Encounter: Payer: Self-pay | Admitting: Internal Medicine

## 2014-05-11 VITALS — BP 140/84 | HR 78 | Temp 97.6°F | Resp 12 | Ht 64.0 in | Wt 262.0 lb

## 2014-05-11 DIAGNOSIS — T300 Burn of unspecified body region, unspecified degree: Secondary | ICD-10-CM | POA: Diagnosis not present

## 2014-05-11 DIAGNOSIS — R609 Edema, unspecified: Secondary | ICD-10-CM

## 2014-05-11 DIAGNOSIS — R197 Diarrhea, unspecified: Secondary | ICD-10-CM | POA: Diagnosis not present

## 2014-05-11 DIAGNOSIS — R109 Unspecified abdominal pain: Secondary | ICD-10-CM | POA: Diagnosis not present

## 2014-05-11 DIAGNOSIS — K219 Gastro-esophageal reflux disease without esophagitis: Secondary | ICD-10-CM

## 2014-05-11 DIAGNOSIS — K439 Ventral hernia without obstruction or gangrene: Secondary | ICD-10-CM | POA: Diagnosis not present

## 2014-05-11 MED ORDER — OMEPRAZOLE 40 MG PO CPDR: 40.0000 mg | DELAYED_RELEASE_CAPSULE | Freq: Every day | ORAL | Status: DC

## 2014-05-11 NOTE — Progress Notes (Signed)
Patient ID: [name], female   DOB: 03-Nov-1945, 69 y.o.   MRN: [id number]    Facility  [facility]    Place of Service:   OFFICE   Allergies  Allergen Reactions  . Aspirin Nausea Only    And causes bruises  . Banana Hives  . Hydrocodone Hives  . Neomycin-Polymyxin-Gramicidin Itching and Swelling    Eye drops caused swelling in face and rash and itching on arms and neck  . Penicillins Hives and Itching  . Tramadol Nausea And Vomiting  . Latex Hives and Rash    Chief Complaint  Patient presents with  . Acute Visit    Internal stomach burning x 3-4 days.   . Medication Management    Stopped Lasix- urinating too much, please advise     HPI:  69 yo female seen today for above. She has a hx DM. She had episode of change in bowel consistency and actually pulled out a "string" from stool that was as wide as a piece of tape.no bloody stools. She c/o constant burning abdominal pain around waist area that improves with fasting. Pain worsens with eating. No nausea/vomiting. No f/c, CP, SOB. BS at home fasting 140-160s; evening 130-150s. Occasional low BS reaction when she forgets to eat. She gets weak and then improves with eating. BP at home 130-140s/70s usually.  She underwent ERCP in Jan 2015 and is s/p stone removal and sphincterctomy. Hx lap chole. She also has hx colostomy due to colonic resection for diverticular disease.  Medications: Patient's Medications  New Prescriptions   No medications on file  Previous Medications   ACCU-CHEK AVIVA PLUS TEST STRIP    USE AS DIRECTED TWICE DAILY   ALLOPURINOL (ZYLOPRIM) 100 MG TABLET    Take 100 mg by mouth daily. 1 by mouth daily   ATORVASTATIN (LIPITOR) 20 MG TABLET    TAKE 1 TABLET BY MOUTH DAILY   BLOOD GLUCOSE MONITORING SUPPL (ACCU-CHEK AVIVA) DEVICE    Use as instructed. Dx E11.40   DIPHENHYDRAMINE (BENADRYL) 25 MG TABLET    Take 2 tablets (50 mg total) by mouth every 6 (six) hours as needed for itching.   FUROSEMIDE (LASIX) 20 MG  TABLET    Take 1 tablet (20 mg total) by mouth daily. For swelling   LISINOPRIL (PRINIVIL,ZESTRIL) 40 MG TABLET    TAKE 1 TABLET BY MOUTH DAILY   LYRICA 75 MG CAPSULE    TAKE 1 CAPSULE BY MOUTH THREE TIMES DAILY   METFORMIN (GLUCOPHAGE) 1000 MG TABLET    TAKE 1 TABLET BY MOUTH TWICE DAILY WITH A MEAL   METOPROLOL SUCCINATE (TOPROL-XL) 50 MG 24 HR TABLET    Take one tablet by mouth once daily   OMEPRAZOLE (PRILOSEC) 20 MG CAPSULE    TAKE ONE CAPSULE BY MOUTH DAILY   POTASSIUM CHLORIDE SA (K-DUR,KLOR-CON) 20 MEQ TABLET    TAKE 1 TABLET BY MOUTH EVERY DAY   TRADJENTA 5 MG TABS TABLET    TAKE 1 TABLET BY MOUTH DAILY   TRIAMCINOLONE ACETONIDE (TRIAMCINOLONE 0.1 % CREAM : EUCERIN) CREA    Apply 1 application topically 3 (three) times daily.  Modified Medications   No medications on file  Discontinued Medications   CIPROFLOXACIN (CIPRO) 500 MG TABLET    Take 1 tablet (500 mg total) by mouth 2 (two) times daily.   METOPROLOL SUCCINATE (TOPROL-XL) 25 MG 24 HR TABLET    TAKE 1 TABLET BY MOUTH EVERY DAY   OMEPRAZOLE (PRILOSEC) 20 MG CAPSULE  TAKE ONE CAPSULE BY MOUTH DAILY   ONDANSETRON (ZOFRAN ODT) 4 MG DISINTEGRATING TABLET    Take 1 tablet (4 mg total) by mouth every 8 (eight) hours as needed for nausea or vomiting.     Review of Systems As above. All other systems reviewed are negative   Filed Vitals:   05/11/14 0829  BP: 140/84  Pulse: 78  Temp: 97.6 F (36.4 C)  TempSrc: Oral  Resp: 12  Height: 5\' 4"  (1.626 m)  Weight: 262 lb (118.842 kg)  SpO2: 97%   Body mass index is 44.95 kg/(m^2).  Physical Exam CONSTITUTIONAL: Looks well in NAD. Awake, alert and oriented x 3 HEENT: PERRLA. Oropharynx clear and without exudate. MMM NECK: Supple. Nontender. No palpable cervical or supraclavicular lymph nodes. No carotid bruit b/l. CVS: Regular rate without murmur, gallop or rub. LUNGS: CTA b/l no wheezing, rales or rhonchi. ABDOMEN: Bowel sounds present x 4. Soft, nondistended. No palpable  mass or bruit. No hepatomegaly. Large left sided ventral hernia, non reducible and NT. EXTREMITIES: +1 pitting LE edema b/l. Distal pulses palpable. No calf tenderness. Chronic venous stasis changes PSYCH: Affect, behavior and mood normal SKIN: 1st degree burn on right thumb without secondary signs of infection.   Labs reviewed: No visits with results within 3 Month(s) from this visit. Latest known visit with results is:  Office Visit on 01/15/2014  Component Date Value Ref Range Status  . Color, UA 01/15/2014 Yellow   Final  . Clarity, UA 01/15/2014 Clear   Final  . Glucose, UA 01/15/2014 Neg   Final  . Bilirubin, UA 01/15/2014 Neg   Final  . Ketones, UA 01/15/2014 Neg   Final  . Spec Grav, UA 01/15/2014 1.010   Final  . Blood, UA 01/15/2014 Trace   Final  . pH, UA 01/15/2014 8.0   Final  . Protein, UA 01/15/2014 Neg   Final  . Urobilinogen, UA 01/15/2014 0.2   Final  . Nitrite, UA 01/15/2014 Neg   Final  . Leukocytes, UA 01/15/2014 moderate (2+)   Final  . Urine Culture, Routine 01/15/2014 Final report*  Final  . Result 1 01/15/2014 Escherichia coli*  Final   Greater than 100,000 colony forming units per mL  . RESULT 2 01/15/2014 Comment   Final   Comment: Mixed urogenital flora                          Less than 10,000 colonies/mL  . ANTIMICROBIAL SUSCEPTIBILITY 01/15/2014 Comment   Final   Comment:       ** S = Susceptible; I = Intermediate; R = Resistant **                                             P = Positive; N = Negative                                      MICS are expressed in micrograms per mL                             Antibiotic                 RSLT#1    RSLT#2  RSLT#3    RSLT#4                          Amoxicillin/Clavulanic Acid    S                          Ampicillin                     S                          Cefepime                       S                          Ceftriaxone                    S                          Cefuroxime                      S                          Cephalothin                    S                          Ciprofloxacin                  S                          Ertapenem                      S                          Gentamicin                     S                          Imipenem                       S                          Levofloxacin                   S                          Nitrofurantoin                 S                          Piperacillin                   S  Tetracycline                   S                          Tobramycin                     S                          Trimethoprim/Sulfa             S  . Glucose 01/15/2014 135* 65 - 99 mg/dL Final  . BUN 01/15/2014 15  8 - 27 mg/dL Final  . Creatinine, Ser 01/15/2014 0.73  0.57 - 1.00 mg/dL Final  . GFR calc non Af Amer 01/15/2014 85  >59 mL/min/1.73 Final  . GFR calc Af Amer 01/15/2014 99  >59 mL/min/1.73 Final  . BUN/Creatinine Ratio 01/15/2014 21  11 - 26 Final  . Sodium 01/15/2014 142  134 - 144 mmol/L Final  . Potassium 01/15/2014 4.3  3.5 - 5.2 mmol/L Final  . Chloride 01/15/2014 100  97 - 108 mmol/L Final  . CO2 01/15/2014 26  18 - 29 mmol/L Final  . Calcium 01/15/2014 9.1  8.7 - 10.3 mg/dL Final  . Hgb A1c MFr Bld 01/15/2014 6.9* 4.8 - 5.6 % Final   Comment:          Pre-diabetes: 5.7 - 6.4                                   Diabetes: >6.4                                   Glycemic control for adults with diabetes: <7.0  . Est. average glucose Bld gHb Est-m* 01/15/2014 151   Final    Chart reviewed  Assessment/Plan   ICD-9-CM ICD-10-CM   1. Abdominal pain, unspecified abdominal location possibly related to large ventral hernia 789.00 R10.9    mid epigastric  2. Gastroesophageal reflux disease without esophagitis 530.81 K21.9   3. Edema - stable 782.3 R60.9   4. Diarrhea - stable 787.91 R19.7   5.      1st degree burn of right thumb   --recommend CT abd/pelvis to further investigate  abdominal pain and to determine if ventral hernia has worsened. Her last CT was in 2009. She is UTD on colonoscopy. May need surgery eval.  --increase PPI to 40mg  daily  --f/u with Dr Mariea Clonts in next 1-2 mos for routine OV  --take lasix QOD to improve compliance  --may use topical neosporin or aloe vera for burn. She is UTD on TDAP   Addysen Louth S. Perlie Gold  [facility] [address] [place], [place] [address] [phone] Office (Wednesdays and Fridays 8 AM - 5 PM) [phone] Cell (Monday-Friday 8 AM - 5 PM)

## 2014-05-11 NOTE — Patient Instructions (Addendum)
Recommend take lasix every other day to improve swelling in legs and reduce urinary frequency  Increase omeprazole to 40mg  daily  May need CT abdomen and pelvis to evaluate abdominal burning and ventral hernia  May apply neosporin to burn and keep covered when cooking  Follow up with Dr Mariea Clonts next available appt

## 2014-05-16 ENCOUNTER — Other Ambulatory Visit: Payer: Self-pay | Admitting: Internal Medicine

## 2014-06-01 ENCOUNTER — Telehealth: Payer: Self-pay | Admitting: *Deleted

## 2014-06-01 NOTE — Telephone Encounter (Signed)
Prior Authorization initiated for Triamcinolone [place] # [id number]. To be determine within 24-48 hours. Case #: [id number] Member ID: [id number] [facility]

## 2014-06-04 ENCOUNTER — Ambulatory Visit: Payer: Commercial Managed Care - HMO | Admitting: Internal Medicine

## 2014-06-05 NOTE — Telephone Encounter (Signed)
Received fax from [facility] and states Prior Authorization not required. The requested drug Triamcinolone 0.1% cream 450/30, is available to the member at the contracted copayment, and no authorization is required when the quantity prescribed is within the maximum dispensing limit.

## 2014-06-07 ENCOUNTER — Other Ambulatory Visit: Payer: Self-pay | Admitting: Internal Medicine

## 2014-06-21 ENCOUNTER — Ambulatory Visit: Payer: Commercial Managed Care - HMO | Admitting: Internal Medicine

## 2014-07-11 ENCOUNTER — Other Ambulatory Visit: Payer: Self-pay | Admitting: Internal Medicine

## 2014-07-17 ENCOUNTER — Telehealth: Payer: Self-pay | Admitting: Internal Medicine

## 2014-07-17 NOTE — Telephone Encounter (Signed)
Called patient and left a message for her to please return my call, according to our records patient is due for a mammogram and called to verify it was ok to order that

## 2014-07-22 ENCOUNTER — Other Ambulatory Visit: Payer: Self-pay | Admitting: Internal Medicine

## 2014-07-24 ENCOUNTER — Other Ambulatory Visit: Payer: Self-pay | Admitting: *Deleted

## 2014-07-24 MED ORDER — GLUCOSE BLOOD VI STRP: ORAL_STRIP | Status: DC

## 2014-07-24 NOTE — Telephone Encounter (Signed)
Patient requested and faxed 

## 2014-07-31 ENCOUNTER — Other Ambulatory Visit: Payer: Self-pay | Admitting: Internal Medicine

## 2014-08-09 ENCOUNTER — Other Ambulatory Visit: Payer: Self-pay | Admitting: Internal Medicine

## 2014-08-21 IMAGING — CR DG TOE GREAT 2+V*R*
3 series · 3 of 3 positions shown · non-contrast
Comparison: 07/17/2007

CLINICAL DATA: Pain, redness and swelling

EXAM:
RIGHT GREAT TOE

[x toes ap right]
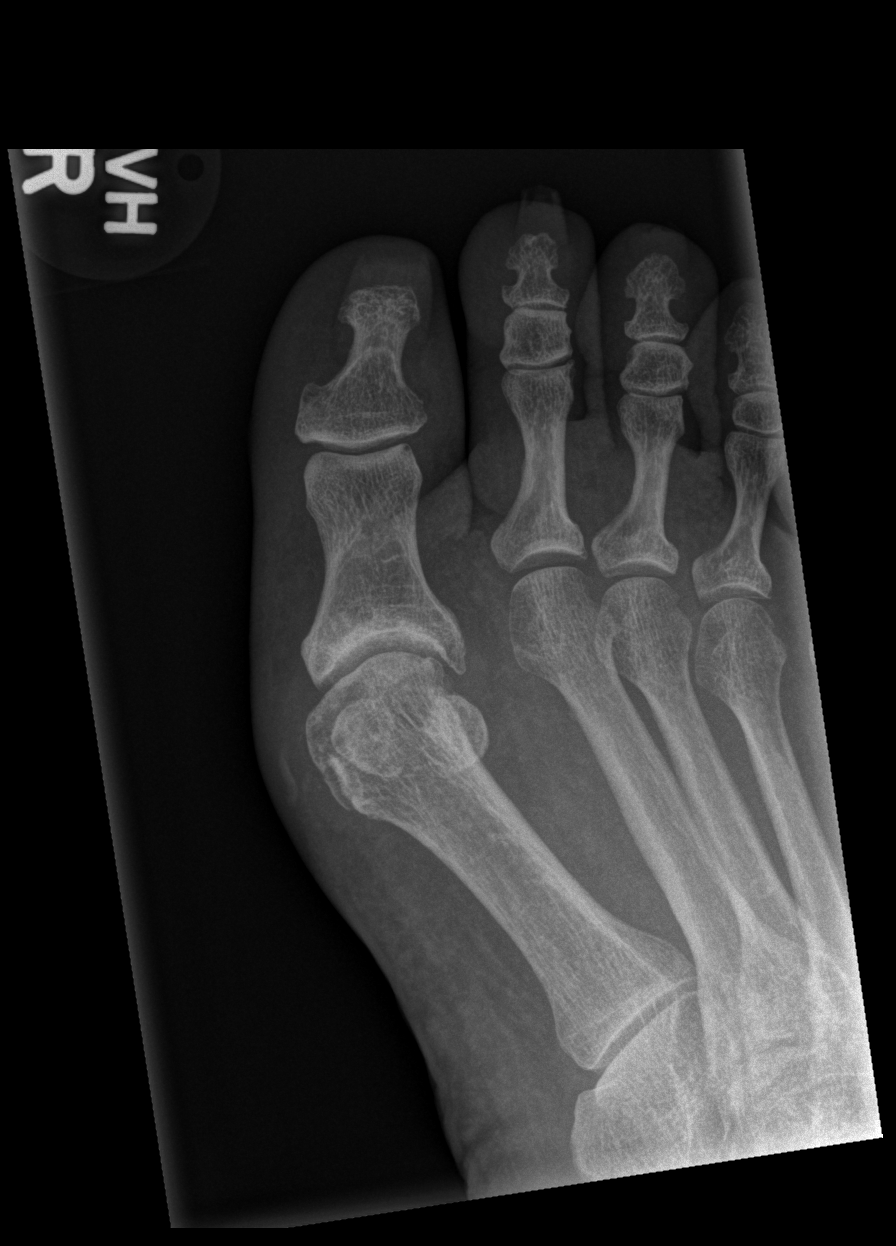

[x toes obl right]
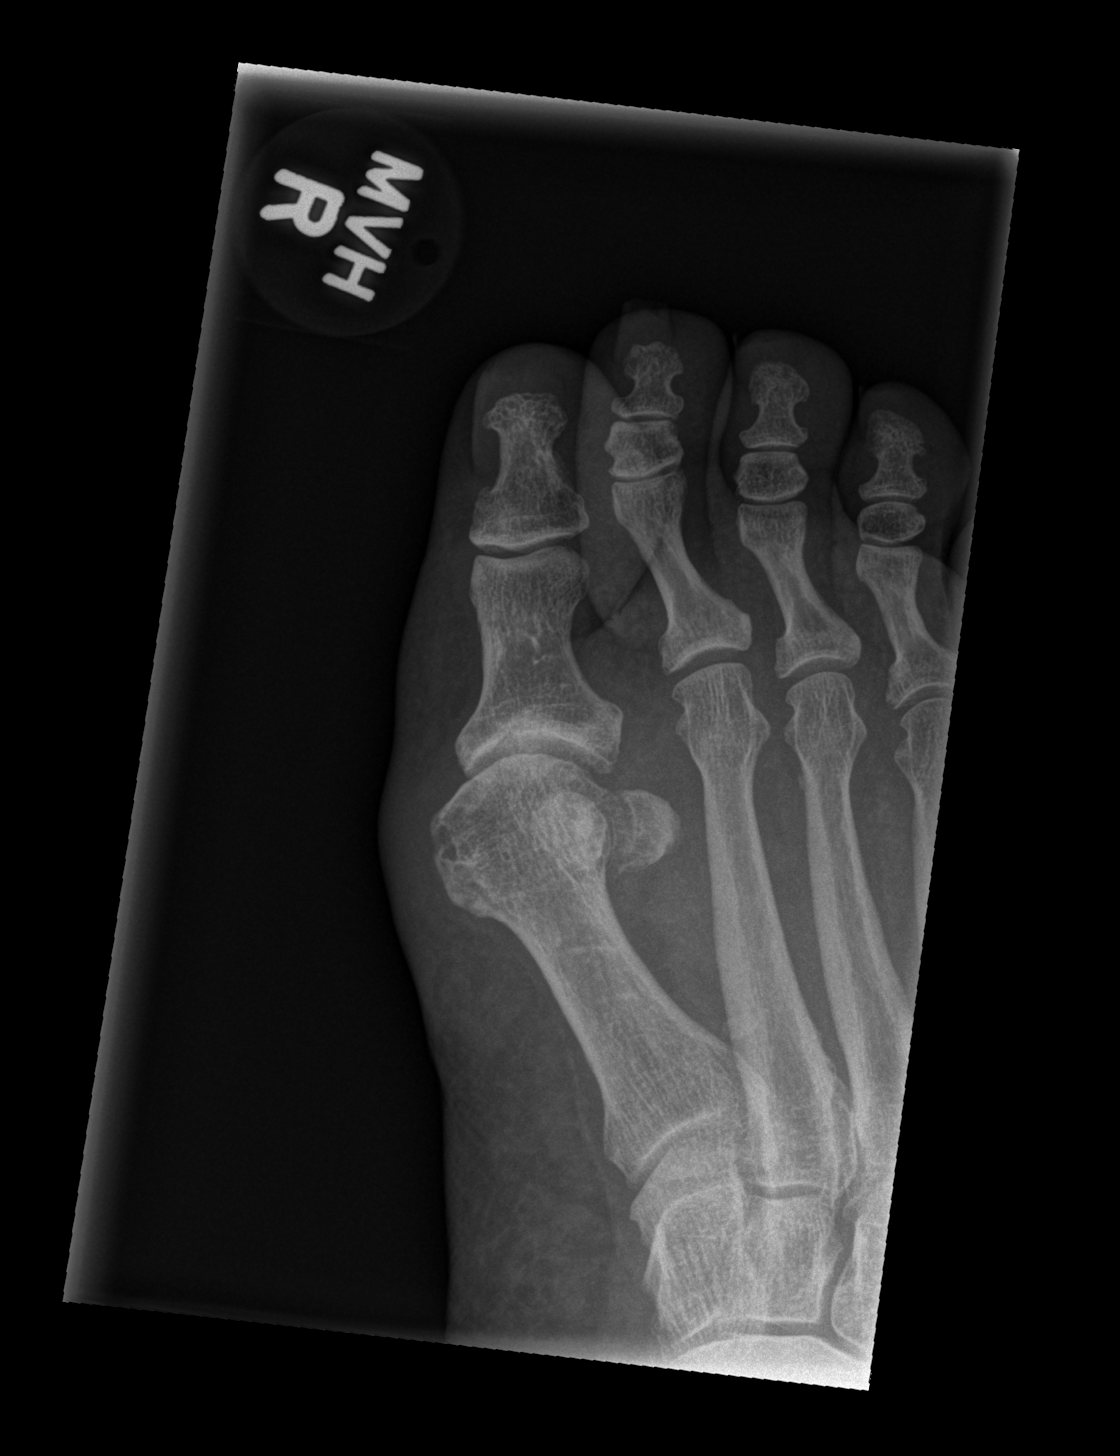

[x toes lat right]
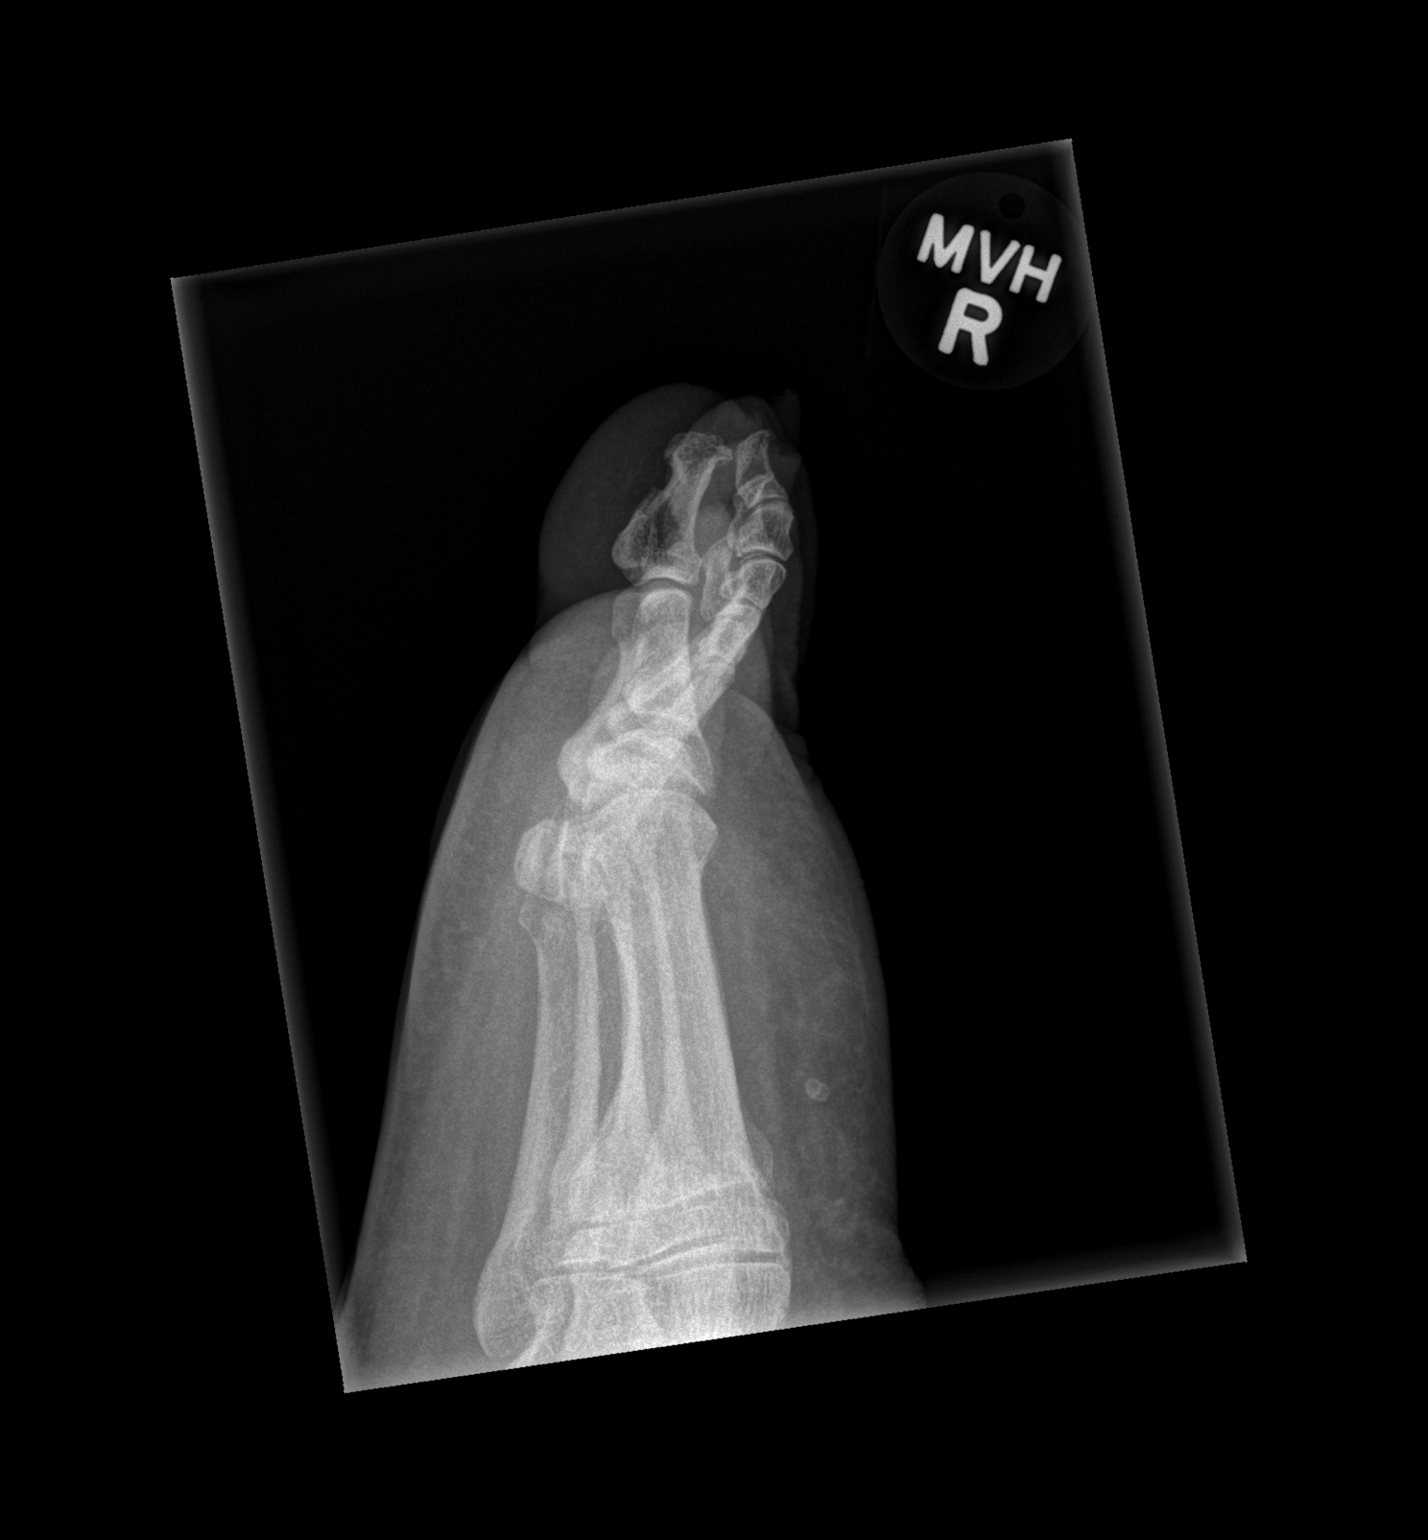

[3 of 3 positions shown; findings below may reference images not displayed]

FINDINGS: There is hallux valgus deformity. There is degenerative arthritis of
the MTP joint. There is no evidence of fracture. No erosions. Soft
tissues adjacent to the MTP joint appears somewhat swollen. No other
focal finding.
IMPRESSION: Hallux valgus deformity with osteoarthritis of the MTP joint.
Possible regional soft tissue swelling.

## 2014-08-23 ENCOUNTER — Other Ambulatory Visit: Payer: Self-pay | Admitting: *Deleted

## 2014-08-23 ENCOUNTER — Other Ambulatory Visit: Payer: Self-pay | Admitting: Internal Medicine

## 2014-08-23 MED ORDER — POTASSIUM CHLORIDE CRYS ER 20 MEQ PO TBCR: 20.0000 meq | EXTENDED_RELEASE_TABLET | Freq: Every day | ORAL | Status: DC

## 2014-08-23 NOTE — Telephone Encounter (Signed)
Patient called and requested. Faxed to pharmacy

## 2014-08-27 ENCOUNTER — Other Ambulatory Visit: Payer: Self-pay | Admitting: Internal Medicine

## 2014-09-17 ENCOUNTER — Other Ambulatory Visit: Payer: Self-pay | Admitting: Internal Medicine

## 2014-09-21 ENCOUNTER — Other Ambulatory Visit: Payer: Self-pay | Admitting: Internal Medicine

## 2014-09-25 ENCOUNTER — Other Ambulatory Visit: Payer: Self-pay

## 2014-09-25 MED ORDER — ALLOPURINOL 100 MG PO TABS: 100.0000 mg | ORAL_TABLET | Freq: Every day | ORAL | Status: DC

## 2014-09-28 ENCOUNTER — Telehealth: Payer: Self-pay | Admitting: Internal Medicine

## 2014-09-28 NOTE — Telephone Encounter (Signed)
Called pt regarding. [facility]- Patient Gap.   Pt says she can not afford to get her eye exam or Mammogram done  At this time. Pt to have A1C checked on 10-01-2014...cdavis

## 2014-10-01 ENCOUNTER — Encounter: Payer: Self-pay | Admitting: Internal Medicine

## 2014-10-01 ENCOUNTER — Ambulatory Visit (INDEPENDENT_AMBULATORY_CARE_PROVIDER_SITE_OTHER): Payer: Commercial Managed Care - HMO | Admitting: Internal Medicine

## 2014-10-01 VITALS — BP 118/76 | HR 63 | Temp 98.7°F | Resp 20 | Ht 64.0 in | Wt 262.6 lb

## 2014-10-01 DIAGNOSIS — M109 Gout, unspecified: Secondary | ICD-10-CM | POA: Diagnosis not present

## 2014-10-01 DIAGNOSIS — I10 Essential (primary) hypertension: Secondary | ICD-10-CM

## 2014-10-01 DIAGNOSIS — R350 Frequency of micturition: Secondary | ICD-10-CM

## 2014-10-01 DIAGNOSIS — E1169 Type 2 diabetes mellitus with other specified complication: Secondary | ICD-10-CM

## 2014-10-01 DIAGNOSIS — E785 Hyperlipidemia, unspecified: Secondary | ICD-10-CM

## 2014-10-01 DIAGNOSIS — E1142 Type 2 diabetes mellitus with diabetic polyneuropathy: Secondary | ICD-10-CM | POA: Diagnosis not present

## 2014-10-01 DIAGNOSIS — R609 Edema, unspecified: Secondary | ICD-10-CM | POA: Diagnosis not present

## 2014-10-01 DIAGNOSIS — Z1239 Encounter for other screening for malignant neoplasm of breast: Secondary | ICD-10-CM | POA: Diagnosis not present

## 2014-10-01 DIAGNOSIS — I872 Venous insufficiency (chronic) (peripheral): Secondary | ICD-10-CM

## 2014-10-01 LAB — POCT URINALYSIS DIPSTICK
Blood, UA: NEGATIVE
Glucose, UA: NEGATIVE
Ketones, UA: NEGATIVE
Leukocytes, UA: NEGATIVE
Nitrite, UA: NEGATIVE
Spec Grav, UA: 1.01
Urobilinogen, UA: 0.2
pH, UA: 7.5

## 2014-10-01 MED ORDER — FUROSEMIDE 20 MG PO TABS: 20.0000 mg | ORAL_TABLET | Freq: Every day | ORAL | Status: DC

## 2014-10-01 NOTE — Patient Instructions (Signed)
Start back with taking your lasix EVERY DAY.  Elevate your feet.  Avoid salty foods like chips, fries, and do not add salt to your food.  I would like you to wear your compression hose.

## 2014-10-01 NOTE — Progress Notes (Signed)
Patient ID: Debra [name], female   DOB: 28-Feb-1946, 69 y.o.   MRN: [id number]   Location:  [facility]  Code Status: full code Goals of Care: Advanced Directive information Does patient have an advance directive?: No, Would patient like information on creating an advanced directive?: Yes - Educational materials given (in Jan, but has not done)   Chief Complaint  Patient presents with  . Medical Management of Chronic Issues    3 month follow-up, check uric acid level,was supposed to be seen on jan 2016, need a1c checked, colon screening    HPI: Patient is a 69 y.o. white female seen in the office today for med mgt of chronic diseases.    Hypertension:  Blood pressure excellent here.  Was 621 systolic at home.  Has been taking it before her medication instead of after.  Has been holding her diuretic b/c she's been having urinary incontinence.  Drinks only water in the mornings, no caffeine.  Urinary frequency, urgency, has had incontinence.  Also smells foul she says.    Still has sores on her legs.  Left one just recently broke down.  Still using her medicated cream on them.  Feet are swollen up off diuretics.  Does elevate her feet at rest.  Doesn't add salt.  Weight is up only 0.6lbs, but suspect it's fluid b/c she's been noncompliant with her diuretic.    DMII:  Is eating better.  Had 1/2 potato.  Eating salads for lunch.  Had chicken with snap beans and corn yesterday.  AM:  132-160 and PM 131-167 per her record from July so far.    Has pain off and on in feet which she blames on gout.  Takes allopurinol for it.  Has not had recent uric acid level.  Review of Systems:  Review of Systems  Constitutional: Negative for fever, chills and malaise/fatigue.  HENT: Negative for congestion and hearing loss.   Eyes: Negative for blurred vision.  Respiratory: Negative for cough and shortness of breath.   Cardiovascular: Positive for leg swelling.  Negative for chest pain and palpitations.  Gastrointestinal: Negative for abdominal pain, constipation, blood in stool and melena.  Genitourinary: Positive for urgency and frequency. Negative for dysuria.       Foul smelling urine  Musculoskeletal: Positive for joint pain. Negative for falls.  Skin: Negative for itching and rash.  Neurological: Negative for dizziness, loss of consciousness and weakness.  Psychiatric/Behavioral: Negative for depression and memory loss. The patient is not nervous/anxious and does not have insomnia.     Past Medical History  Diagnosis Date  . Diabetes mellitus with neurological manifestation   . Hypertension goal BP (blood pressure) < 130/80   . Hyperlipidemia LDL goal < 100   . Osteopenia     DEXA scan 7/07  . Chronic venous insufficiency   . Morbid obesity   . Diverticulitis     h/o 2-3 episodes in past  . Leg edema     secondary to chronic venous insufficiency  . Cellulitis of left leg     history of, most recent episode 01/09    Past Surgical History  Procedure Laterality Date  . Appendectomy    . Abdominal hysterectomy    . Colectomy      with diverting colsotmy and revision in the 1990's for diverticulitis  . Eye surgery  8/13    left cataract removal & retinal repair  . Cholecystectomy    .  Cholecystectomy N/A 04/14/2013    Procedure: LAPAROSCOPIC CHOLECYSTECTOMY WITH INTRAOPERATIVE CHOLANGIOGRAM;  Surgeon: Adin Hector, MD;  Location: [facility];  Service: General;  Laterality: N/A;  . Laparoscopic lysis of adhesions N/A 04/14/2013    Procedure: LAPAROSCOPIC LYSIS OF ADHESIONS;  Surgeon: Adin Hector, MD;  Location: [facility];  Service: General;  Laterality: N/A;  . Ercp N/A 04/17/2013    Procedure: ENDOSCOPIC RETROGRADE CHOLANGIOPANCREATOGRAPHY (ERCP);  Surgeon: Irene Shipper, MD;  Location: [facility];  Service: Endoscopy;  Laterality: N/A;  note pt want general anesthesia for this case.  preferto perform in endo unit, but if  unavailable please arrange time in OR    Allergies  Allergen Reactions  . Aspirin Nausea Only    And causes bruises  . Banana Hives  . Hydrocodone Hives  . Neomycin-Polymyxin-Gramicidin Itching and Swelling    Eye drops caused swelling in face and rash and itching on arms and neck  . Penicillins Hives and Itching  . Tramadol Nausea And Vomiting  . Latex Hives and Rash   Medications: Patient's Medications  New Prescriptions   No medications on file  Previous Medications   ALLOPURINOL (ZYLOPRIM) 100 MG TABLET    Take 1 tablet (100 mg total) by mouth daily. 1 by mouth daily   ATORVASTATIN (LIPITOR) 20 MG TABLET    TAKE 1 TABLET BY MOUTH DAILY   BLOOD GLUCOSE MONITORING SUPPL (ACCU-CHEK AVIVA) DEVICE    Use as instructed. Dx E11.40   DIPHENHYDRAMINE (BENADRYL) 25 MG TABLET    Take 2 tablets (50 mg total) by mouth every 6 (six) hours as needed for itching.   GLUCOSE BLOOD (ACCU-CHEK AVIVA PLUS) TEST STRIP    CHECK BLOOD SUGAR TWICE DAILY AS DIRECTED   LISINOPRIL (PRINIVIL,ZESTRIL) 40 MG TABLET    TAKE 1 TABLET BY MOUTH DAILY   LYRICA 75 MG CAPSULE    TAKE 1 CAPSULE BY MOUTH THREE TIMES DAILY   METFORMIN (GLUCOPHAGE) 1000 MG TABLET    TAKE 1 TABLET BY MOUTH TWICE DAILY WITH MEALS   METOPROLOL SUCCINATE (TOPROL-XL) 50 MG 24 HR TABLET    Take one tablet by mouth once daily   OMEPRAZOLE (PRILOSEC) 40 MG CAPSULE    TAKE ONE CAPSULE BY MOUTH DAILY   POTASSIUM CHLORIDE SA (K-DUR,KLOR-CON) 20 MEQ TABLET    TAKE 1 TABLET(20 MEQ) BY MOUTH DAILY   TRADJENTA 5 MG TABS TABLET    TAKE 1 TABLET BY MOUTH DAILY   TRIAMCINOLONE CREAM (KENALOG) 0.1 %    APPLY TOPICALLY THREE TIMES DAILY  Modified Medications   Modified Medication Previous Medication   FUROSEMIDE (LASIX) 20 MG TABLET furosemide (LASIX) 20 MG tablet      Take 1 tablet (20 mg total) by mouth daily. For swelling    Take 1 tablet (20 mg total) by mouth daily. For swelling  Discontinued Medications   No medications on file    Physical  Exam: Filed Vitals:   10/01/14 1040  BP: 118/76  Pulse: 63  Temp: 98.7 F (37.1 C)  TempSrc: Oral  Resp: 20  Height: 5\' 4"  (1.626 m)  Weight: 262 lb 9.6 oz (119.115 kg)  SpO2: 94%   Physical Exam  Constitutional: She is oriented to person, place, and time.  Obese white female  Neck: No JVD present.  Cardiovascular: Normal rate, regular rhythm, normal heart sounds and intact distal pulses.   bilateral 2+ edema; chronic venous insufficiency changes with brawny discoloration and erythema, several pustules on bilateral shins  Pulmonary/Chest:  Effort normal and breath sounds normal. She has no rales.  Abdominal: Soft. Bowel sounds are normal.  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  Skin: Skin is warm and dry.  See CV  Psychiatric: She has a normal mood and affect. Her behavior is normal.    Labs reviewed: Basic Metabolic Panel:  Recent Labs  10/03/13 0920 01/15/14 1126  NA 141 142  K 4.6 4.3  CL 100 100  CO2 24 26  GLUCOSE 148* 135*  BUN 15 15  CREATININE 0.75 0.73  CALCIUM 9.6 9.1   Liver Function Tests:  Recent Labs  10/03/13 0920  AST 17  ALT 21  ALKPHOS 76  BILITOT 0.4  PROT 6.5   No results for input(s): LIPASE, AMYLASE in the last 8760 hours. No results for input(s): AMMONIA in the last 8760 hours. CBC:  Recent Labs  10/03/13 0920  WBC 7.5  NEUTROABS 4.1  HGB 13.7  HCT 40.8  MCV 89  PLT 275   Lipid Panel:  Recent Labs  10/03/13 0920  CHOL 155  HDL 45  LDLCALC 85  TRIG 123  CHOLHDL 3.4   Lab Results  Component Value Date   HGBA1C 6.9* 01/15/2014    Assessment/Plan 1. Gout of multiple sites, unspecified cause, unspecified chronicity -cont allopurinol -check  Uric Acid--if still elevated,needs uloric  2. Screening for breast cancer - once again reminded she needs to get her mammogram done--referral written again, but pt must make and  keep the appt - MM DIGITAL SCREENING BILATERAL; Future  3. Type 2 diabetes mellitus with diabetic polyneuropathy -seems control improved based on home readings, but will see what hba1c is--she was supposed to come in several months ago for this -says she made dietary changes - Basic metabolic panel; Future - Hemoglobin A1c; Future - Basic metabolic panel - Hemoglobin A1c  4. Essential hypertension, benign -bp at goal today with toprol, lisinopril, but not taking her lasix  5. Chronic venous insufficiency -restart lasix b/clegs are more swollen and ulcerated Also to restart compression hose--needs them to prevent ulcers -unfortunately, cannot go w/o diuretic despite urinary incontinence--bet she is not following her diet either  6. Hyperlipidemia associated with type 2 diabetes mellitus -cont statin therapy, f/u lipid panel which wasn't done since last July - Lipid panel; Future - Lipid panel  7. Morbid obesity -cont to work on diet and walk more -no better - Hemoglobin A1c; Future - Lipid panel; Future - Hemoglobin A1c - Lipid panel  8. Urinary frequency - POCT urinalysis dipstick done which was negative for infection -probably due to high glucose and not drinking enough water b/c she isn't even taking her lasix as directed  9. Edema -restart lasix! - furosemide (LASIX) 20 MG tablet; Take 1 tablet (20 mg total) by mouth daily. For swelling  Dispense: 30 tablet; Refill: 3  Labs/tests ordered:   Orders Placed This Encounter  Procedures  . MM DIGITAL SCREENING BILATERAL    Standing Status: Future     Number of Occurrences:      Standing Expiration Date: 12/01/2015    Order Specific Question:  Reason for exam:    Answer:  routine breast cancer screening    Order Specific Question:  Preferred imaging location?    Answer:  [facility]  . Uric Acid    Standing Status: Future     Number of Occurrences: 1  Standing Expiration Date: 10/01/2015  . Basic metabolic  panel    Standing Status: Future     Number of Occurrences: 1     Standing Expiration Date: 04/03/2015    Order Specific Question:  Has the patient fasted?    Answer:  Yes  . Hemoglobin A1c    Standing Status: Future     Number of Occurrences: 1     Standing Expiration Date: 04/03/2015  . Lipid panel    Standing Status: Future     Number of Occurrences: 1     Standing Expiration Date: 04/03/2015    Order Specific Question:  Has the patient fasted?    Answer:  Yes  . POCT urinalysis dipstick    Next appt:  3 mos with labs before  Dwayn Moravek L. Jacek Colson, D.O. [facility] [address], [place] [address] Cell Phone (Mon-Fri 8am-5pm):  [phone] On Call:  [phone] & follow prompts after 5pm & weekends Office Phone:  [phone] Office Fax:  [phone]

## 2014-10-02 ENCOUNTER — Telehealth: Payer: Self-pay

## 2014-10-02 DIAGNOSIS — E7800 Pure hypercholesterolemia, unspecified: Secondary | ICD-10-CM

## 2014-10-02 LAB — BASIC METABOLIC PANEL
BUN/Creatinine Ratio: 21 (ref 11–26)
BUN: 16 mg/dL (ref 8–27)
CO2: 23 mmol/L (ref 18–29)
Calcium: 9.3 mg/dL (ref 8.7–10.3)
Chloride: 99 mmol/L (ref 97–108)
Creatinine, Ser: 0.76 mg/dL (ref 0.57–1.00)
GFR calc Af Amer: 94 mL/min/{1.73_m2} (ref >59)
GFR calc non Af Amer: 81 mL/min/{1.73_m2} (ref >59)
Glucose: 130 mg/dL — ABNORMAL HIGH (ref 65–99)
Potassium: 4.4 mmol/L (ref 3.5–5.2)
Sodium: 141 mmol/L (ref 134–144)

## 2014-10-02 LAB — URIC ACID: Uric Acid: 4.6 mg/dL (ref 2.5–7.1)

## 2014-10-02 LAB — LIPID PANEL
Chol/HDL Ratio: 3.4 ratio units (ref 0.0–4.4)
Cholesterol, Total: 147 mg/dL (ref 100–199)
HDL: 43 mg/dL (ref >39)
LDL Calculated: 77 mg/dL (ref 0–99)
Triglycerides: 133 mg/dL (ref 0–149)
VLDL Cholesterol Cal: 27 mg/dL (ref 5–40)

## 2014-10-02 LAB — HEMOGLOBIN A1C
Est. average glucose Bld gHb Est-mCnc: 160 mg/dL
Hgb A1c MFr Bld: 7.2 % — ABNORMAL HIGH (ref 4.8–5.6)

## 2014-10-02 MED ORDER — ATORVASTATIN CALCIUM 40 MG PO TABS: 40.0000 mg | ORAL_TABLET | Freq: Every day | ORAL | Status: DC

## 2014-10-02 NOTE — Telephone Encounter (Signed)
-----   Message from Gayland Curry, DO sent at 10/01/2014 12:07 PM EDT ----- No UTI.  Do not send for culture.

## 2014-10-02 NOTE — Telephone Encounter (Signed)
Discussed labs with patient:  Sugar average has worsened (up to 7.2 from 6.9). Uric acid level is within range so allopurinol is working. I want her to come see [facility] b/c I think she would do best on one of the newer injectable agents that could help her lose weight, but she is going to need the education about it. She needs to take her lasix, avoid salty foods and elevate her feet, wear her compression hose. Her cholesterol is almost at goal with medication. Increase lipitor from 20mg  to 40mg  with her evening meal. Recheck fasting lipids before her next appt  Patient verbalized understanding of results. Patient will take 2 20 mg of lipitor to use up current supply then will begin 40 mg (RX sent). Patient needs to check her schedule and will call back to schedule with Oretha Ellis. Patient already has pending lab appointment prior to next appointment. Copy of labs mailed

## 2014-10-03 ENCOUNTER — Other Ambulatory Visit: Payer: Self-pay | Admitting: Internal Medicine

## 2014-10-04 ENCOUNTER — Other Ambulatory Visit: Payer: Self-pay | Admitting: Internal Medicine

## 2014-10-08 ENCOUNTER — Other Ambulatory Visit: Payer: Self-pay | Admitting: Internal Medicine

## 2014-10-10 ENCOUNTER — Telehealth: Payer: Self-pay | Admitting: *Deleted

## 2014-10-10 MED ORDER — ALLOPURINOL 100 MG PO TABS: ORAL_TABLET | ORAL | Status: DC

## 2014-10-10 NOTE — Telephone Encounter (Signed)
Patient notified and understood directions. Faxed Rx to pharmacy.

## 2014-10-10 NOTE — Telephone Encounter (Signed)
Patient called and stated that she couldn't get a refill on her Allopurinol because the pharmacy has her taking it once daily and she has been taking it twice daily. Patient called and wanted [place] to call in for twice daily and refill it. Her chart and last OV states Once daily. How would you like for patient to take? Please Advise.

## 2014-10-10 NOTE — Telephone Encounter (Signed)
Continue allopurinol only once a day.

## 2014-10-18 ENCOUNTER — Other Ambulatory Visit: Payer: Self-pay | Admitting: Internal Medicine

## 2014-10-19 ENCOUNTER — Other Ambulatory Visit: Payer: Self-pay | Admitting: Internal Medicine

## 2014-10-25 ENCOUNTER — Other Ambulatory Visit: Payer: Self-pay | Admitting: Internal Medicine

## 2014-11-21 ENCOUNTER — Other Ambulatory Visit: Payer: Self-pay | Admitting: Internal Medicine

## 2014-11-22 ENCOUNTER — Other Ambulatory Visit: Payer: Self-pay | Admitting: *Deleted

## 2014-11-22 MED ORDER — PREGABALIN 75 MG PO CAPS: ORAL_CAPSULE | ORAL | Status: DC

## 2014-11-22 NOTE — Telephone Encounter (Signed)
Patient requested, printed and faxed to pharmacy

## 2014-11-27 ENCOUNTER — Telehealth: Payer: Self-pay | Admitting: *Deleted

## 2014-11-27 NOTE — Telephone Encounter (Signed)
Mr. Debra [name] needs home health if the orthopedic surgeon did not order it.  I had recommended he go to SNF b/c she cannot afford to take care of him with her own poor health.  Mrs. Debra [name] needs an appt to be seen for an acute visit ASAP.

## 2014-11-27 NOTE — Telephone Encounter (Signed)
Patient called and stated that she has gout in her left foot, Takes the medication for it but it is not helping. She states her toe looks like a raspberry and the top of her foot is swollen. It is hard for her to walk. It is hard for her to get out because her husband just had surgery and she has to take care of him 24/7. Please Advise.

## 2014-11-28 NOTE — Telephone Encounter (Signed)
Patient notified and agreed. Appointment schedule for tomorrow 9/8 with Dr. Mariea Clonts.

## 2014-11-29 ENCOUNTER — Encounter: Payer: Self-pay | Admitting: Internal Medicine

## 2014-11-29 ENCOUNTER — Ambulatory Visit (INDEPENDENT_AMBULATORY_CARE_PROVIDER_SITE_OTHER): Payer: Commercial Managed Care - HMO | Admitting: Internal Medicine

## 2014-11-29 VITALS — BP 138/78 | HR 79 | Temp 97.7°F | Resp 20 | Ht 64.0 in | Wt 260.6 lb

## 2014-11-29 DIAGNOSIS — L03119 Cellulitis of unspecified part of limb: Secondary | ICD-10-CM | POA: Diagnosis not present

## 2014-11-29 DIAGNOSIS — E13628 Other specified diabetes mellitus with other skin complications: Secondary | ICD-10-CM | POA: Diagnosis not present

## 2014-11-29 DIAGNOSIS — E1142 Type 2 diabetes mellitus with diabetic polyneuropathy: Secondary | ICD-10-CM

## 2014-11-29 DIAGNOSIS — R609 Edema, unspecified: Secondary | ICD-10-CM

## 2014-11-29 DIAGNOSIS — E11628 Type 2 diabetes mellitus with other skin complications: Secondary | ICD-10-CM

## 2014-11-29 DIAGNOSIS — M1A0720 Idiopathic chronic gout, left ankle and foot, without tophus (tophi): Secondary | ICD-10-CM | POA: Diagnosis not present

## 2014-11-29 DIAGNOSIS — E11621 Type 2 diabetes mellitus with foot ulcer: Secondary | ICD-10-CM

## 2014-11-29 DIAGNOSIS — L97529 Non-pressure chronic ulcer of other part of left foot with unspecified severity: Secondary | ICD-10-CM | POA: Diagnosis not present

## 2014-11-29 MED ORDER — DOXYCYCLINE HYCLATE 100 MG PO TABS: 100.0000 mg | ORAL_TABLET | Freq: Two times a day (BID) | ORAL | Status: DC

## 2014-11-29 MED ORDER — ALLOPURINOL 100 MG PO TABS: ORAL_TABLET | ORAL | Status: DC

## 2014-11-29 NOTE — Progress Notes (Signed)
Patient ID: Debra [name], female   DOB: 10/02/1945, 69 y.o.   MRN: [id number]   Location:  [facility]  Code Status: full code Goals of Care: Advanced Directive information Does patient have an advance directive?: Yes   Chief Complaint  Patient presents with  . Acute Visit    Has pain, swelling and gout in right toe and both feet, got worse since friday has yellow spot on right great toe    HPI: Patient is a 69 y.o. white female seen in the office today for an acute visit for swelling of both feet with severe swelling, redness, pain in right great toe with a yellow place on the underside.  She is historically noncompliant with her diabetic heart healthy, gout diet.  Her husband is with her who just had back surgery.  He has apparently been trimming the callous she has on her right great toe for many years despite my recommendations that she only see podiatry for foot care.  When he last did this, he noted that she had an open blister on the bottom of the right great toe that was draining yellow fluid, her toe was very red and swollen.  She only notes pain when the toe is wrapped or squeezing, but has very little sensation in the bottoms of her feet.  She has not had fevers or chills.  She says she's been elevating her feet at rest and taking her lasix as ordered.  She has dropped some fluid weight since her last visit 2.5 lbs since July 11th.   Review of Systems:  Review of Systems  Constitutional: Negative for fever, chills and malaise/fatigue.  HENT: Negative for congestion.   Eyes: Negative for blurred vision.  Respiratory: Negative for shortness of breath.   Cardiovascular: Positive for leg swelling. Negative for chest pain, palpitations, orthopnea, claudication and PND.  Gastrointestinal: Negative for abdominal pain, constipation, blood in stool and melena.  Genitourinary: Negative for dysuria, urgency and frequency.  Musculoskeletal: Negative  for myalgias and falls.  Skin:       Open blister on right great toe  Neurological: Negative for dizziness, loss of consciousness and weakness.  Endo/Heme/Allergies:       Poorly controlled diabetic  Psychiatric/Behavioral: Negative for depression and memory loss.    Past Medical History  Diagnosis Date  . Diabetes mellitus with neurological manifestation   . Hypertension goal BP (blood pressure) < 130/80   . Hyperlipidemia LDL goal < 100   . Osteopenia     DEXA scan 7/07  . Chronic venous insufficiency   . Morbid obesity   . Diverticulitis     h/o 2-3 episodes in past  . Leg edema     secondary to chronic venous insufficiency  . Cellulitis of left leg     history of, most recent episode 01/09    Past Surgical History  Procedure Laterality Date  . Appendectomy    . Abdominal hysterectomy    . Colectomy      with diverting colsotmy and revision in the 1990's for diverticulitis  . Eye surgery  8/13    left cataract removal & retinal repair  . Cholecystectomy    . Cholecystectomy N/A 04/14/2013    Procedure: LAPAROSCOPIC CHOLECYSTECTOMY WITH INTRAOPERATIVE CHOLANGIOGRAM;  Surgeon: Adin Hector, MD;  Location: [facility];  Service: General;  Laterality: N/A;  . Laparoscopic lysis of adhesions N/A 04/14/2013    Procedure: LAPAROSCOPIC LYSIS OF ADHESIONS;  Surgeon:  Adin Hector, MD;  Location: [facility];  Service: General;  Laterality: N/A;  . Ercp N/A 04/17/2013    Procedure: ENDOSCOPIC RETROGRADE CHOLANGIOPANCREATOGRAPHY (ERCP);  Surgeon: Irene Shipper, MD;  Location: [facility];  Service: Endoscopy;  Laterality: N/A;  note pt want general anesthesia for this case.  preferto perform in endo unit, but if unavailable please arrange time in OR    Allergies  Allergen Reactions  . Aspirin Nausea Only    And causes bruises  . Banana Hives  . Hydrocodone Hives  . Neomycin-Polymyxin-Gramicidin Itching and Swelling    Eye drops caused swelling in face and rash and itching on arms  and neck  . Penicillins Hives and Itching  . Tramadol Nausea And Vomiting  . Latex Hives and Rash   Medications: Patient's Medications  New Prescriptions   No medications on file  Previous Medications   ACCU-CHEK AVIVA PLUS TEST STRIP    USE AS DIRECTED TWICE DAILY   ALLOPURINOL (ZYLOPRIM) 100 MG TABLET    Take one tablet by mouth once daily for gout   ATORVASTATIN (LIPITOR) 40 MG TABLET    Take 1 tablet (40 mg total) by mouth daily. For High Cholesterol. Patient will take 2 of 20 mg tablet and then 40 mg once daily.   BLOOD GLUCOSE MONITORING SUPPL (ACCU-CHEK AVIVA) DEVICE    Use as instructed. Dx E11.40   DIPHENHYDRAMINE (BENADRYL) 25 MG TABLET    Take 2 tablets (50 mg total) by mouth every 6 (six) hours as needed for itching.   FUROSEMIDE (LASIX) 20 MG TABLET    Take 1 tablet (20 mg total) by mouth daily. For swelling   GLUCOSE BLOOD (ACCU-CHEK AVIVA PLUS) TEST STRIP    CHECK BLOOD SUGAR TWICE DAILY AS DIRECTED   LISINOPRIL (PRINIVIL,ZESTRIL) 40 MG TABLET    TAKE 1 TABLET BY MOUTH DAILY   METFORMIN (GLUCOPHAGE) 1000 MG TABLET    TAKE 1 TABLET BY MOUTH TWICE DAILY WITH MEALS   METOPROLOL SUCCINATE (TOPROL-XL) 50 MG 24 HR TABLET    Take one tablet by mouth once daily   OMEPRAZOLE (PRILOSEC) 40 MG CAPSULE    TAKE 1 CAPSULE BY MOUTH DAILY   POTASSIUM CHLORIDE SA (K-DUR,KLOR-CON) 20 MEQ TABLET    TAKE 1 TABLET(20 MEQ) BY MOUTH DAILY   POTASSIUM CHLORIDE SA (K-DUR,KLOR-CON) 20 MEQ TABLET    TAKE 1 TABLET(20 MEQ) BY MOUTH DAILY   PREGABALIN (LYRICA) 75 MG CAPSULE    Take one capsule by mouth three times daily for pains   TRADJENTA 5 MG TABS TABLET    TAKE 1 TABLET BY MOUTH EVERY DAY   TRIAMCINOLONE CREAM (KENALOG) 0.1 %    APPLY TOPICALLY THREE TIMES DAILY  Modified Medications   No medications on file  Discontinued Medications   ATORVASTATIN (LIPITOR) 20 MG TABLET    TAKE 1 TABLET BY MOUTH DAILY    Physical Exam: Filed Vitals:   11/29/14 1518  BP: 138/78  Pulse: 79  Temp: 97.7 F  (36.5 C)  TempSrc: Oral  Resp: 20  Height: 5\' 4"  (1.626 m)  Weight: 260 lb 9.6 oz (118.207 kg)  SpO2: 96%   Physical Exam  Constitutional: She appears well-developed and well-nourished. No distress.  Cardiovascular: Normal rate, regular rhythm, normal heart sounds and intact distal pulses.   Pulmonary/Chest: Effort normal and breath sounds normal. She has no rales.  Abdominal: Soft. Bowel sounds are normal.  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    Labs reviewed: Basic Metabolic Panel:  Recent Labs  01/15/14 1126 10/01/14 1125  NA 142 141  K 4.3 4.4  CL 100 99  CO2 26 23  GLUCOSE 135* 130*  BUN 15 16  CREATININE 0.73 0.76  CALCIUM 9.1 9.3   Liver Function Tests: No results for input(s): AST, ALT, ALKPHOS, BILITOT, PROT, ALBUMIN in the last 8760 hours. No results for input(s): LIPASE, AMYLASE in the last 8760 hours. No results for input(s): AMMONIA in the last 8760 hours. CBC: No results for input(s): WBC, NEUTROABS, HGB, HCT, MCV, PLT in the last 8760 hours. Lipid Panel:  Recent Labs  10/01/14 1125  CHOL 147  HDL 43  LDLCALC 77  TRIG 133  CHOLHDL 3.4   Lab Results  Component Value Date   HGBA1C 7.2* 10/01/2014   Assessment/Plan 1. Idiopathic chronic gout of left foot without tophus -does have h/o gout and somehow when allopurinol was ordered last it got reduced to daily from bid so reordered, but appears her toe is infected not an acute gout flare - allopurinol (ZYLOPRIM) 100 MG tablet; Take one tablet by mouth twice daily for gout   Dispense: 60 tablet; Refill: 3 - Uric Acid  2. 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  Dispense: 20 tablet; Refill: 0 -recommended she eat yogurt daily while on it, but she does not like yogurt so I gave her two sample packs (14 days worth) of align probiotic to take daily until she uses them up - CBC with Differential/Platelet - Basic metabolic panel  3. Type 2 diabetes mellitus with diabetic polyneuropathy -has been poorly controlled due to noncompliance with diet -also will need to see podiatry for the debridement of the callous and nail trimming, regular diabetic foot monitoring -needs to wear diabetic shoes NOT sandals--doubt she will comply but will work on this more next visit  4. Edema -cont to elevate feet when resting and maintain low sodium diet   5. Morbid obesity -remains, does not exercise and eats a poor diet  6. Cellulitis in diabetic foot - right great toe at this point; at high risk for spread, osteomyelitis and amputation -if not looking better when I see her next week, I will send her to the wound care center - CBC with Differential/Platelet - Basic metabolic panel  Labs/tests ordered:   Orders Placed This Encounter  Procedures  . CBC with Differential/Platelet  . Uric Acid  . Basic metabolic panel   Next appt:  ~1 week f/u on wound on toe  Ily Denno L. Jameia Makris, D.O. [facility] [address], [place] [address] Cell Phone (Mon-Fri 8am-5pm):  [phone] On Call:  [phone] & follow prompts after 5pm & weekends Office Phone:  [phone] Office Fax:  [phone]

## 2014-11-29 NOTE — Patient Instructions (Addendum)
Keep left great toe clean and dry with antibacterial soap and warm water daily.   Apply dry nonadherent dressing to the toe and wrap with kerlix.   Take align daily until you finish the samples. Also take doxycycline twice daily for 10 days for your toe infection.

## 2014-11-30 LAB — CBC WITH DIFFERENTIAL/PLATELET
Basophils Absolute: 0 10*3/uL (ref 0.0–0.2)
Basos: 0 %
EOS (ABSOLUTE): 0.4 10*3/uL (ref 0.0–0.4)
Eos: 4 %
Hematocrit: 38.1 % (ref 34.0–46.6)
Hemoglobin: 12.8 g/dL (ref 11.1–15.9)
Immature Grans (Abs): 0 10*3/uL (ref 0.0–0.1)
Immature Granulocytes: 0 %
Lymphocytes Absolute: 3 10*3/uL (ref 0.7–3.1)
Lymphs: 32 %
MCH: 30.3 pg (ref 26.6–33.0)
MCHC: 33.6 g/dL (ref 31.5–35.7)
MCV: 90 fL (ref 79–97)
Monocytes Absolute: 0.7 10*3/uL (ref 0.1–0.9)
Monocytes: 8 %
Neutrophils Absolute: 5.1 10*3/uL (ref 1.4–7.0)
Neutrophils: 56 %
Platelets: 269 10*3/uL (ref 150–379)
RBC: 4.23 x10E6/uL (ref 3.77–5.28)
RDW: 14.8 % (ref 12.3–15.4)
WBC: 9.2 10*3/uL (ref 3.4–10.8)

## 2014-11-30 LAB — BASIC METABOLIC PANEL
BUN/Creatinine Ratio: 22 (ref 11–26)
BUN: 17 mg/dL (ref 8–27)
CO2: 25 mmol/L (ref 18–29)
Calcium: 9 mg/dL (ref 8.7–10.3)
Chloride: 102 mmol/L (ref 97–108)
Creatinine, Ser: 0.79 mg/dL (ref 0.57–1.00)
GFR calc Af Amer: 89 mL/min/{1.73_m2} (ref >59)
GFR calc non Af Amer: 77 mL/min/{1.73_m2} (ref >59)
Glucose: 135 mg/dL — ABNORMAL HIGH (ref 65–99)
Potassium: 4.6 mmol/L (ref 3.5–5.2)
Sodium: 147 mmol/L — ABNORMAL HIGH (ref 134–144)

## 2014-11-30 LAB — URIC ACID: Uric Acid: 5.5 mg/dL (ref 2.5–7.1)

## 2014-12-10 ENCOUNTER — Encounter: Payer: Self-pay | Admitting: Internal Medicine

## 2014-12-10 ENCOUNTER — Ambulatory Visit (INDEPENDENT_AMBULATORY_CARE_PROVIDER_SITE_OTHER): Payer: Commercial Managed Care - HMO | Admitting: Internal Medicine

## 2014-12-10 VITALS — BP 132/82 | HR 73 | Temp 97.5°F | Resp 20 | Ht 64.0 in | Wt 263.0 lb

## 2014-12-10 DIAGNOSIS — L03119 Cellulitis of unspecified part of limb: Secondary | ICD-10-CM

## 2014-12-10 DIAGNOSIS — E11621 Type 2 diabetes mellitus with foot ulcer: Secondary | ICD-10-CM | POA: Diagnosis not present

## 2014-12-10 DIAGNOSIS — R609 Edema, unspecified: Secondary | ICD-10-CM

## 2014-12-10 DIAGNOSIS — Z23 Encounter for immunization: Secondary | ICD-10-CM

## 2014-12-10 DIAGNOSIS — E13628 Other specified diabetes mellitus with other skin complications: Secondary | ICD-10-CM | POA: Diagnosis not present

## 2014-12-10 DIAGNOSIS — L97529 Non-pressure chronic ulcer of other part of left foot with unspecified severity: Secondary | ICD-10-CM | POA: Diagnosis not present

## 2014-12-10 DIAGNOSIS — E1142 Type 2 diabetes mellitus with diabetic polyneuropathy: Secondary | ICD-10-CM

## 2014-12-10 DIAGNOSIS — E11628 Type 2 diabetes mellitus with other skin complications: Secondary | ICD-10-CM

## 2014-12-10 DIAGNOSIS — I872 Venous insufficiency (chronic) (peripheral): Secondary | ICD-10-CM | POA: Diagnosis not present

## 2014-12-10 NOTE — Patient Instructions (Addendum)
For 3 days, take lasix 20mg  at breakfast and again at lunch. Also take potassium 62meq at breakfast and again at lunch. This should help decrease your weight and swelling in your legs. Avoid coke, cheetos and other salty snacks.  Try to avoid preserved lunchmeats.  Do not add salt to your food. Elevate your feet at rest.

## 2014-12-10 NOTE — Addendum Note (Signed)
Addended by: Marisa Cyphers C on: 12/10/2014 10:20 AM   Modules accepted: Orders, Medications

## 2014-12-10 NOTE — Progress Notes (Signed)
Patient ID: Debra [name], female   DOB: 1945/07/28, 69 y.o.   MRN: [id number]   Location:  [facility]  Goals of Care: Advanced Directive information Does patient have an advance directive?: Yes   Chief Complaint  Patient presents with  . Medical Management of Chronic Issues    1 week follow-up for left toe wound has open blisters on left leg  . Immunizations    will take flu shot    HPI: Patient is a 69 y.o. white female seen in the office today for follow up on her left great toe ulcer, edema, chronic venous insufficiency, chf.   Agrees to flu shot for her and also wants her husband to have his.    Has been drinking cokes, eating cheetos. She has gained 3.6 lbs since her appt last week.   Has been taking her lasix 20mg  daily.  Legs have gotten more edematous.  Is elevating her feet.  They have gotten so much bigger, blisters have formed and two have ruptured on her left anterior shin.  No shortness of breath.  No chest pain.  Says appetite is poor.    Review of Systems:  Review of Systems  Constitutional: Negative for fever, chills and weight loss.       Wt gain  HENT: Negative for congestion.   Eyes: Negative for blurred vision.  Respiratory: Negative for cough, shortness of breath and wheezing.   Cardiovascular: Positive for leg swelling. Negative for chest pain, palpitations, orthopnea, claudication and PND.  Gastrointestinal: Negative for abdominal pain and constipation.  Genitourinary: Positive for frequency. Negative for dysuria.  Musculoskeletal: Negative for falls.  Skin:       Erythema of legs with ulcerations  Neurological: Negative for dizziness and loss of consciousness.  Psychiatric/Behavioral: Negative for memory loss.    Past Medical History  Diagnosis Date  . Diabetes mellitus with neurological manifestation   . Hypertension goal BP (blood pressure) < 130/80   . Hyperlipidemia LDL goal < 100   . Osteopenia    DEXA scan 7/07  . Chronic venous insufficiency   . Morbid obesity   . Diverticulitis     h/o 2-3 episodes in past  . Leg edema     secondary to chronic venous insufficiency  . Cellulitis of left leg     history of, most recent episode 01/09    Past Surgical History  Procedure Laterality Date  . Appendectomy    . Abdominal hysterectomy    . Colectomy      with diverting colsotmy and revision in the 1990's for diverticulitis  . Eye surgery  8/13    left cataract removal & retinal repair  . Cholecystectomy    . Cholecystectomy N/A 04/14/2013    Procedure: LAPAROSCOPIC CHOLECYSTECTOMY WITH INTRAOPERATIVE CHOLANGIOGRAM;  Surgeon: Adin Hector, MD;  Location: [facility];  Service: General;  Laterality: N/A;  . Laparoscopic lysis of adhesions N/A 04/14/2013    Procedure: LAPAROSCOPIC LYSIS OF ADHESIONS;  Surgeon: Adin Hector, MD;  Location: [facility];  Service: General;  Laterality: N/A;  . Ercp N/A 04/17/2013    Procedure: ENDOSCOPIC RETROGRADE CHOLANGIOPANCREATOGRAPHY (ERCP);  Surgeon: Irene Shipper, MD;  Location: [facility];  Service: Endoscopy;  Laterality: N/A;  note pt want general anesthesia for this case.  preferto perform in endo unit, but if unavailable please arrange time in OR    Allergies  Allergen Reactions  . Aspirin Nausea Only    And  causes bruises  . Banana Hives  . Hydrocodone Hives  . Neomycin-Polymyxin-Gramicidin Itching and Swelling    Eye drops caused swelling in face and rash and itching on arms and neck  . Penicillins Hives and Itching  . Tramadol Nausea And Vomiting  . Latex Hives and Rash   Medications: Patient's Medications  New Prescriptions   No medications on file  Previous Medications   ACCU-CHEK AVIVA PLUS TEST STRIP    USE AS DIRECTED TWICE DAILY   ALLOPURINOL (ZYLOPRIM) 100 MG TABLET    Take one tablet by mouth twice daily for gout   ATORVASTATIN (LIPITOR) 40 MG TABLET    Take 1 tablet (40 mg total) by mouth daily. For High Cholesterol.  Patient will take 2 of 20 mg tablet and then 40 mg once daily.   BLOOD GLUCOSE MONITORING SUPPL (ACCU-CHEK AVIVA) DEVICE    Use as instructed. Dx E11.40   DIPHENHYDRAMINE (BENADRYL) 25 MG TABLET    Take 2 tablets (50 mg total) by mouth every 6 (six) hours as needed for itching.   DOXYCYCLINE (VIBRA-TABS) 100 MG TABLET    Take 1 tablet (100 mg total) by mouth 2 (two) times daily.   FUROSEMIDE (LASIX) 20 MG TABLET    Take 1 tablet (20 mg total) by mouth daily. For swelling   GLUCOSE BLOOD (ACCU-CHEK AVIVA PLUS) TEST STRIP    CHECK BLOOD SUGAR TWICE DAILY AS DIRECTED   LISINOPRIL (PRINIVIL,ZESTRIL) 40 MG TABLET    TAKE 1 TABLET BY MOUTH DAILY   METFORMIN (GLUCOPHAGE) 1000 MG TABLET    TAKE 1 TABLET BY MOUTH TWICE DAILY WITH MEALS   METOPROLOL SUCCINATE (TOPROL-XL) 50 MG 24 HR TABLET    Take one tablet by mouth once daily   OMEPRAZOLE (PRILOSEC) 40 MG CAPSULE    TAKE 1 CAPSULE BY MOUTH DAILY   POTASSIUM CHLORIDE SA (K-DUR,KLOR-CON) 20 MEQ TABLET    TAKE 1 TABLET(20 MEQ) BY MOUTH DAILY   POTASSIUM CHLORIDE SA (K-DUR,KLOR-CON) 20 MEQ TABLET    TAKE 1 TABLET(20 MEQ) BY MOUTH DAILY   PREGABALIN (LYRICA) 75 MG CAPSULE    Take one capsule by mouth three times daily for pains   TRADJENTA 5 MG TABS TABLET    TAKE 1 TABLET BY MOUTH EVERY DAY   TRIAMCINOLONE CREAM (KENALOG) 0.1 %    APPLY TOPICALLY THREE TIMES DAILY  Modified Medications   No medications on file  Discontinued Medications   No medications on file    Physical Exam: Filed Vitals:   12/10/14 0859  BP: 132/82  Pulse: 73  Temp: 97.5 F (36.4 C)  TempSrc: Oral  Resp: 20  Height: 5\' 4"  (1.626 m)  Weight: 263 lb (119.296 kg)  SpO2: 95%   Physical Exam  Constitutional: She is oriented to person, place, and time. She appears well-developed and well-nourished. No distress.  Obese white female  Cardiovascular: Normal rate, regular rhythm and normal heart sounds.   Feet are warm  Pulmonary/Chest: Effort normal and breath sounds normal. No  respiratory distress.  Abdominal: Bowel sounds are normal.  Musculoskeletal: Normal range of motion.  Wide-based gait with lateral deviation of feet  Neurological: She is alert and oriented to person, place, and time.  Skin:  Bilateral LEs with 3+ pitting edema; erythema of both anterior shins and petechial appearing rash on left leg proximal shin; 2 nickel-sized ulcerations of anterior left shin; left foot with blister of great toe now healed, but continues with large callous present medially, skin has peeled  off great toe; continues to wear sandals not sneakers   Psychiatric: She has a normal mood and affect.  Limited education    Labs reviewed: Basic Metabolic Panel:  Recent Labs  01/15/14 1126 10/01/14 1125 11/29/14 1559  NA 142 141 147*  K 4.3 4.4 4.6  CL 100 99 102  CO2 26 23 25   GLUCOSE 135* 130* 135*  BUN 15 16 17   CREATININE 0.73 0.76 0.79  CALCIUM 9.1 9.3 9.0   Liver Function Tests: No results for input(s): AST, ALT, ALKPHOS, BILITOT, PROT, ALBUMIN in the last 8760 hours. No results for input(s): LIPASE, AMYLASE in the last 8760 hours. No results for input(s): AMMONIA in the last 8760 hours. CBC:  Recent Labs  11/29/14 1559  WBC 9.2  NEUTROABS 5.1  HCT 38.1   Lipid Panel:  Recent Labs  10/01/14 1125  CHOL 147  HDL 43  LDLCALC 77  TRIG 133  CHOLHDL 3.4   Lab Results  Component Value Date   HGBA1C 7.2* 10/01/2014   Assessment/Plan 1. Chronic venous insufficiency - ongoing problem due to noncompliance with low sodium diet -counseled on this today  -finally agrees to home health care for further education on low sodium, low cholesterol, diabetic diet -also needs weight monitoring -cont to elevate feet at rest -use cream as ordered on legs -may need unnaboots applied -could not get ABIs paid for previously to evaluate arterial circulation - Ambulatory referral to Home Health  2. Ulcer of left great toe due to diabetes mellitus - has healed -  high risk for recurrence -also needs diabetic shoes -she is hard to convince to do anything for herself and diverts conversation onto her husband's problems all of the time -advised not to do anything to the callous on her left great toe b/c any home treatment of it by a non medical professional can cause infection -should see podiatry, but has not yet agreed to this - Ambulatory referral to [facility]  3. Type 2 diabetes mellitus with diabetic polyneuropathy -has been poorly controlled, counseled on decreased simple carbs like her cokes and snacks -does not exercise -cont lisinopril, metformin, tradjenta, lipitor; cannot take aspirin  4. Edema - due to venous insufficiency; echo last year was unremarkable -insurance wouldn't cover arterial assessment - Ambulatory referral to [facility]  5. Cellulitis in diabetic foot -has resolved with doxycycline--still completing course  6. Need for influenza vaccination -flu shot given to her and her husband today  Labs/tests ordered:  Has labs before Oct visit Next appt:  Keep as scheduled  Rooney Gladwin L. Livvy Spilman, D.O. [facility] [address], [place] [address] Cell Phone (Mon-Fri 8am-5pm):  [phone] On Call:  [phone] & follow prompts after 5pm & weekends Office Phone:  [phone] Office Fax:  [phone]

## 2014-12-12 DIAGNOSIS — E1142 Type 2 diabetes mellitus with diabetic polyneuropathy: Secondary | ICD-10-CM | POA: Diagnosis not present

## 2014-12-12 DIAGNOSIS — I10 Essential (primary) hypertension: Secondary | ICD-10-CM | POA: Diagnosis not present

## 2014-12-12 DIAGNOSIS — L97522 Non-pressure chronic ulcer of other part of left foot with fat layer exposed: Secondary | ICD-10-CM | POA: Diagnosis not present

## 2014-12-12 DIAGNOSIS — I872 Venous insufficiency (chronic) (peripheral): Secondary | ICD-10-CM | POA: Diagnosis not present

## 2014-12-12 DIAGNOSIS — L97822 Non-pressure chronic ulcer of other part of left lower leg with fat layer exposed: Secondary | ICD-10-CM | POA: Diagnosis not present

## 2014-12-12 DIAGNOSIS — E11621 Type 2 diabetes mellitus with foot ulcer: Secondary | ICD-10-CM | POA: Diagnosis not present

## 2014-12-13 ENCOUNTER — Ambulatory Visit: Payer: Self-pay | Admitting: Internal Medicine

## 2014-12-13 ENCOUNTER — Telehealth: Payer: Self-pay | Admitting: *Deleted

## 2014-12-13 NOTE — Telephone Encounter (Signed)
Debra [name] with [facility] called wanting a verbal for OT and PT for patient. Verbal given and she will fax order.

## 2014-12-17 ENCOUNTER — Encounter: Payer: Self-pay | Admitting: Internal Medicine

## 2014-12-18 ENCOUNTER — Other Ambulatory Visit: Payer: Self-pay | Admitting: Internal Medicine

## 2014-12-18 ENCOUNTER — Telehealth: Payer: Self-pay | Admitting: *Deleted

## 2014-12-18 NOTE — Telephone Encounter (Signed)
Debra [name] with [facility] called and wanted verbal orders for Wound Care Left Foot Great Toe. Verbal orders given.

## 2014-12-20 ENCOUNTER — Other Ambulatory Visit: Payer: Self-pay | Admitting: *Deleted

## 2014-12-20 MED ORDER — PREGABALIN 75 MG PO CAPS: ORAL_CAPSULE | ORAL | Status: DC

## 2014-12-20 NOTE — Telephone Encounter (Signed)
[facility] 

## 2014-12-27 ENCOUNTER — Other Ambulatory Visit: Payer: Commercial Managed Care - HMO

## 2014-12-29 ENCOUNTER — Other Ambulatory Visit: Payer: Self-pay | Admitting: Internal Medicine

## 2014-12-31 ENCOUNTER — Ambulatory Visit: Payer: Commercial Managed Care - HMO | Admitting: Internal Medicine

## 2015-01-01 ENCOUNTER — Encounter: Payer: Self-pay | Admitting: Nurse Practitioner

## 2015-01-01 ENCOUNTER — Ambulatory Visit (INDEPENDENT_AMBULATORY_CARE_PROVIDER_SITE_OTHER): Payer: Commercial Managed Care - HMO | Admitting: Nurse Practitioner

## 2015-01-01 VITALS — BP 130/84 | HR 83 | Temp 98.2°F | Ht 64.0 in | Wt 257.0 lb

## 2015-01-01 DIAGNOSIS — M1A0720 Idiopathic chronic gout, left ankle and foot, without tophus (tophi): Secondary | ICD-10-CM | POA: Diagnosis not present

## 2015-01-01 DIAGNOSIS — N390 Urinary tract infection, site not specified: Secondary | ICD-10-CM | POA: Diagnosis not present

## 2015-01-01 DIAGNOSIS — R300 Dysuria: Secondary | ICD-10-CM

## 2015-01-01 LAB — POCT URINALYSIS DIPSTICK
BILIRUBIN UA: NEGATIVE
Glucose, UA: NEGATIVE
Ketones, UA: NEGATIVE
NITRITE UA: NEGATIVE
PH UA: 6
Protein, UA: 30
Spec Grav, UA: 1.015
Urobilinogen, UA: 2

## 2015-01-01 MED ORDER — PHENAZOPYRIDINE HCL 95 MG PO TABS: 95.0000 mg | ORAL_TABLET | Freq: Three times a day (TID) | ORAL | Status: DC | PRN

## 2015-01-01 MED ORDER — CIPROFLOXACIN HCL 250 MG PO TABS: 250.0000 mg | ORAL_TABLET | Freq: Two times a day (BID) | ORAL | Status: DC

## 2015-01-01 MED ORDER — ALLOPURINOL 100 MG PO TABS: ORAL_TABLET | ORAL | Status: DC

## 2015-01-01 NOTE — Progress Notes (Signed)
Patient ID: Debra [name], female   DOB: 20-Aug-1945, 69 y.o.   MRN: [id number]    PCP: Hollace Kinnier, DO  Advanced Directive information    Allergies  Allergen Reactions  . Aspirin Nausea Only    And causes bruises  . Banana Hives  . Hydrocodone Hives  . Neomycin-Polymyxin-Gramicidin Itching and Swelling    Eye drops caused swelling in face and rash and itching on arms and neck  . Penicillins Hives and Itching  . Tramadol Nausea And Vomiting  . Latex Hives and Rash    Chief Complaint  Patient presents with  . Acute Visit    Possible UTI: buring and pain when urinating x 1 week, tried OTC remedies first      HPI: Patient is a 69 y.o. female seen in the office today due to burning with urination. Have been having burning with urination for 1 week. Has not drank a lot of fluid this morning because she does not want to go to the bathroom because it burns. Increase frequency with small amount of urine. Hx of UTI. Has increased hydration over the last week which has not been effective. Reports she had fevers on Sunday and Monday. Took some Advil earlier today. Denies flank or back pain. Just burning. No itching, rash or discharge to vaginal area.   Review of Systems: Review of Systems  Constitutional: Negative for fever, chills, activity change and appetite change.  Respiratory: Negative for cough, shortness of breath and wheezing.   Cardiovascular: Negative for chest pain and palpitations.  Gastrointestinal: Positive for diarrhea (chronic). Negative for abdominal pain and constipation.  Genitourinary: Positive for dysuria, urgency and frequency. Negative for hematuria.  Neurological: Negative for dizziness.    Past Medical History  Diagnosis Date  . Diabetes mellitus with neurological manifestation ([place])   . Hypertension goal BP (blood pressure) < 130/80   . Hyperlipidemia LDL goal < 100   . Osteopenia     DEXA scan 7/07  . Chronic venous insufficiency   . Morbid obesity  ([place])   . Diverticulitis     h/o 2-3 episodes in past  . Leg edema     secondary to chronic venous insufficiency  . Cellulitis of left leg     history of, most recent episode 01/09   Past Surgical History  Procedure Laterality Date  . Appendectomy    . Abdominal hysterectomy    . Colectomy      with diverting colsotmy and revision in the 1990's for diverticulitis  . Eye surgery  8/13    left cataract removal & retinal repair  . Cholecystectomy    . Cholecystectomy N/A 04/14/2013    Procedure: LAPAROSCOPIC CHOLECYSTECTOMY WITH INTRAOPERATIVE CHOLANGIOGRAM;  Surgeon: Adin Hector, MD;  Location: [facility];  Service: General;  Laterality: N/A;  . Laparoscopic lysis of adhesions N/A 04/14/2013    Procedure: LAPAROSCOPIC LYSIS OF ADHESIONS;  Surgeon: Adin Hector, MD;  Location: [facility];  Service: General;  Laterality: N/A;  . Ercp N/A 04/17/2013    Procedure: ENDOSCOPIC RETROGRADE CHOLANGIOPANCREATOGRAPHY (ERCP);  Surgeon: Irene Shipper, MD;  Location: [facility];  Service: Endoscopy;  Laterality: N/A;  note pt want general anesthesia for this case.  preferto perform in endo unit, but if unavailable please arrange time in OR   Social History:   reports that she has never smoked. She has never used smokeless tobacco. She reports that she does not drink alcohol or use illicit drugs.  Family History  Problem  Relation Age of Onset  . Heart disease Mother   . Heart disease Father   . Heart disease Brother   . Cancer Brother   . Cancer Brother   . Hypertension Sister   . Heart disease Sister     Medications: Patient's Medications  New Prescriptions   No medications on file  Previous Medications   ACCU-CHEK AVIVA PLUS TEST STRIP    USE AS DIRECTED TWICE DAILY   ALLOPURINOL (ZYLOPRIM) 100 MG TABLET    Take one tablet by mouth twice daily for gout   ATORVASTATIN (LIPITOR) 40 MG TABLET    Take 1 tablet (40 mg total) by mouth daily. For High Cholesterol. Patient will take 2 of 20 mg  tablet and then 40 mg once daily.   DIPHENHYDRAMINE (BENADRYL) 25 MG TABLET    Take 2 tablets (50 mg total) by mouth every 6 (six) hours as needed for itching.   FUROSEMIDE (LASIX) 20 MG TABLET    Take 1 tablet (20 mg total) by mouth daily. For swelling   GLUCOSE BLOOD (ACCU-CHEK AVIVA PLUS) TEST STRIP    CHECK BLOOD SUGAR TWICE DAILY AS DIRECTED   LISINOPRIL (PRINIVIL,ZESTRIL) 40 MG TABLET    TAKE 1 TABLET BY MOUTH DAILY   METFORMIN (GLUCOPHAGE) 1000 MG TABLET    TAKE 1 TABLET BY MOUTH TWICE DAILY WITH MEALS   METOPROLOL SUCCINATE (TOPROL-XL) 50 MG 24 HR TABLET    Take one tablet by mouth once daily   OMEPRAZOLE (PRILOSEC) 20 MG CAPSULE    TAKE 1 CAPSULE BY MOUTH DAILY   POTASSIUM CHLORIDE SA (K-DUR,KLOR-CON) 20 MEQ TABLET    TAKE 1 TABLET(20 MEQ) BY MOUTH DAILY   PREGABALIN (LYRICA) 75 MG CAPSULE    Take one capsule by mouth three times daily for pains   TRADJENTA 5 MG TABS TABLET    TAKE 1 TABLET BY MOUTH EVERY DAY   TRIAMCINOLONE CREAM (KENALOG) 0.1 %    APPLY TOPICALLY THREE TIMES DAILY  Modified Medications   No medications on file  Discontinued Medications   DOXYCYCLINE (VIBRA-TABS) 100 MG TABLET    Take 1 tablet (100 mg total) by mouth 2 (two) times daily.   OMEPRAZOLE (PRILOSEC) 40 MG CAPSULE    TAKE 1 CAPSULE BY MOUTH DAILY     Physical Exam:  Filed Vitals:   01/01/15 1327  BP: 130/84  Pulse: 83  Temp: 98.2 F (36.8 C)  TempSrc: Oral  Height: 5\' 4"  (1.626 m)  Weight: 257 lb (116.574 kg)  SpO2: 95%   Body mass index is 44.09 kg/(m^2).  Physical Exam  Constitutional: She is oriented to person, place, and time. She appears well-developed and well-nourished. No distress.  Cardiovascular: Normal rate, regular rhythm and normal heart sounds.   Pulmonary/Chest: Effort normal and breath sounds normal.  Abdominal: Soft. Bowel sounds are normal. She exhibits no distension. There is no tenderness. There is no CVA tenderness.  Genitourinary: Vagina normal.  Neurological: She is  alert and oriented to person, place, and time.  Skin: Skin is warm and dry. She is not diaphoretic.    Labs reviewed: Basic Metabolic Panel:  Recent Labs  01/15/14 1126 10/01/14 1125 11/29/14 1559  NA 142 141 147*  K 4.3 4.4 4.6  CL 100 99 102  CO2 26 23 25   GLUCOSE 135* 130* 135*  BUN 15 16 17   CREATININE 0.73 0.76 0.79  CALCIUM 9.1 9.3 9.0   Liver Function Tests: No results for input(s): AST, ALT, ALKPHOS, BILITOT, PROT,  ALBUMIN in the last 8760 hours. No results for input(s): LIPASE, AMYLASE in the last 8760 hours. No results for input(s): AMMONIA in the last 8760 hours. CBC:  Recent Labs  11/29/14 1559  WBC 9.2  NEUTROABS 5.1  HCT 38.1   Lipid Panel:  Recent Labs  10/01/14 1125  CHOL 147  HDL 43  LDLCALC 77  TRIG 133  CHOLHDL 3.4   TSH: No results for input(s): TSH in the last 8760 hours. A1C: Lab Results  Component Value Date   HGBA1C 7.2* 10/01/2014     Assessment/Plan 1. Dysuria - POCT urinalysis dipstick positive  Culture, Urine - phenazopyridine (PYRIDIUM) 95 MG tablet; Take 1 tablet (95 mg total) by mouth 3 (three) times daily as needed for pain.  Dispense: 10 tablet; Refill: 0  2. UTI (lower urinary tract infection) -UA showing moderate leukocytes and large blood. Will send for culture, encouraged hydration - ciprofloxacin (CIPRO) 250 MG tablet; Take 1 tablet (250 mg total) by mouth 2 (two) times daily.  Dispense: 14 tablet; Refill: 0 - phenazopyridine (PYRIDIUM) 95 MG tablet; Take 1 tablet (95 mg total) by mouth 3 (three) times daily as needed for pain.  Dispense: 10 tablet; Refill: 0  3. Idiopathic chronic gout of left foot without tophus -refill providied - allopurinol (ZYLOPRIM) 100 MG tablet; Take one tablet by mouth twice daily for gout  Dispense: 180 tablet; Refill: 1     Menaal Russum K. Harle Battiest  [facility] [phone] 8 am - 5 pm) [phone] (after hours)

## 2015-01-01 NOTE — Patient Instructions (Signed)
Will start cipro 250 mg twice daily for 1 week- will await urine culture to make sure you are taking the correct antibiotic   Increase fluid intake  May take Pyridium every 8 hours as needed for burning- may take up to 2 days

## 2015-01-02 ENCOUNTER — Ambulatory Visit (INDEPENDENT_AMBULATORY_CARE_PROVIDER_SITE_OTHER): Payer: Commercial Managed Care - HMO | Admitting: Internal Medicine

## 2015-01-02 ENCOUNTER — Encounter: Payer: Self-pay | Admitting: Internal Medicine

## 2015-01-02 ENCOUNTER — Telehealth: Payer: Self-pay

## 2015-01-02 VITALS — BP 140/84 | HR 83 | Temp 98.3°F | Resp 20 | Ht 64.0 in | Wt 258.2 lb

## 2015-01-02 DIAGNOSIS — I872 Venous insufficiency (chronic) (peripheral): Secondary | ICD-10-CM | POA: Diagnosis not present

## 2015-01-02 DIAGNOSIS — L8992 Pressure ulcer of unspecified site, stage 2: Secondary | ICD-10-CM | POA: Diagnosis not present

## 2015-01-02 DIAGNOSIS — L97529 Non-pressure chronic ulcer of other part of left foot with unspecified severity: Secondary | ICD-10-CM

## 2015-01-02 DIAGNOSIS — E1142 Type 2 diabetes mellitus with diabetic polyneuropathy: Secondary | ICD-10-CM | POA: Diagnosis not present

## 2015-01-02 DIAGNOSIS — L089 Local infection of the skin and subcutaneous tissue, unspecified: Secondary | ICD-10-CM

## 2015-01-02 DIAGNOSIS — R600 Localized edema: Secondary | ICD-10-CM

## 2015-01-02 DIAGNOSIS — E11621 Type 2 diabetes mellitus with foot ulcer: Secondary | ICD-10-CM

## 2015-01-02 MED ORDER — DOXYCYCLINE HYCLATE 100 MG PO TABS: 100.0000 mg | ORAL_TABLET | Freq: Two times a day (BID) | ORAL | Status: DC

## 2015-01-02 MED ORDER — CHLORHEXIDINE GLUCONATE 4 % EX LIQD: CUTANEOUS | Status: DC

## 2015-01-02 NOTE — Progress Notes (Signed)
Patient ID: Debra [name], female   DOB: 09-Sep-1945, 69 y.o.   MRN: [id number]      Location:    [facility]   Place of Service:   OFFICE  Chief Complaint  Patient presents with  . Acute Visit    right big toe pain,    HPI:  69 yo female seen today for toe pain. She has a hx DM with neuropathy (last A1c 7.2%) and chronic venous insufficiency with edema. She reports left toe felt better after completing abx last month but 2 days ago, she noticed the pain returning. It started throbbing "real bad" this AM around 0300. She started Cipro for UTI yesterday. No f/c. She takes allopurinol for gout prevention. BS 110-150s in last few days. No high sugar readings. No low BS reactions  She declined HH as she was not satisfied with service. She was seen by [facility]. She is not home bound  Past Medical History  Diagnosis Date  . Diabetes mellitus with neurological manifestation ([place])   . Hypertension goal BP (blood pressure) < 130/80   . Hyperlipidemia LDL goal < 100   . Osteopenia     DEXA scan 7/07  . Chronic venous insufficiency   . Morbid obesity ([place])   . Diverticulitis     h/o 2-3 episodes in past  . Leg edema     secondary to chronic venous insufficiency  . Cellulitis of left leg     history of, most recent episode 01/09    Past Surgical History  Procedure Laterality Date  . Appendectomy    . Abdominal hysterectomy    . Colectomy      with diverting colsotmy and revision in the 1990's for diverticulitis  . Eye surgery  8/13    left cataract removal & retinal repair  . Cholecystectomy    . Cholecystectomy N/A 04/14/2013    Procedure: LAPAROSCOPIC CHOLECYSTECTOMY WITH INTRAOPERATIVE CHOLANGIOGRAM;  Surgeon: Adin Hector, MD;  Location: [facility];  Service: General;  Laterality: N/A;  . Laparoscopic lysis of adhesions N/A 04/14/2013    Procedure: LAPAROSCOPIC LYSIS OF ADHESIONS;  Surgeon: Adin Hector, MD;  Location: [facility];  Service: General;  Laterality: N/A;  . Ercp N/A  04/17/2013    Procedure: ENDOSCOPIC RETROGRADE CHOLANGIOPANCREATOGRAPHY (ERCP);  Surgeon: Irene Shipper, MD;  Location: [facility];  Service: Endoscopy;  Laterality: N/A;  note pt want general anesthesia for this case.  preferto perform in endo unit, but if unavailable please arrange time in OR    Patient Care Team: Gayland Curry, DO as PCP - General (Geriatric Medicine)  Social History   Social History  . Marital Status: Married    Spouse Name: N/A  . Number of Children: N/A  . Years of Education: N/A   Occupational History  . Not on file.   Social History Main Topics  . Smoking status: Never Smoker   . Smokeless tobacco: Never Used  . Alcohol Use: No  . Drug Use: No  . Sexual Activity: No   Other Topics Concern  . Not on file   Social History Narrative   One sister with chronic progressive disease requiring a trach, and 24/7 care   One sister with an abrupt hospitalization for a critical condition     reports that she has never smoked. She has never used smokeless tobacco. She reports that she does not drink alcohol or use illicit drugs.  Allergies  Allergen Reactions  . Aspirin Nausea Only  And causes bruises  . Banana Hives  . Hydrocodone Hives  . Neomycin-Polymyxin-Gramicidin Itching and Swelling    Eye drops caused swelling in face and rash and itching on arms and neck  . Penicillins Hives and Itching  . Tramadol Nausea And Vomiting  . Latex Hives and Rash    Medications: Patient's Medications  New Prescriptions   No medications on file  Previous Medications   ACCU-CHEK AVIVA PLUS TEST STRIP    USE AS DIRECTED TWICE DAILY   ALLOPURINOL (ZYLOPRIM) 100 MG TABLET    Take one tablet by mouth twice daily for gout   ATORVASTATIN (LIPITOR) 40 MG TABLET    Take 1 tablet (40 mg total) by mouth daily. For High Cholesterol. Patient will take 2 of 20 mg tablet and then 40 mg once daily.   CIPROFLOXACIN (CIPRO) 250 MG TABLET    Take 1 tablet (250 mg total) by mouth  2 (two) times daily.   DIPHENHYDRAMINE (BENADRYL) 25 MG TABLET    Take 2 tablets (50 mg total) by mouth every 6 (six) hours as needed for itching.   FUROSEMIDE (LASIX) 20 MG TABLET    Take 1 tablet (20 mg total) by mouth daily. For swelling   GLUCOSE BLOOD (ACCU-CHEK AVIVA PLUS) TEST STRIP    CHECK BLOOD SUGAR TWICE DAILY AS DIRECTED   LISINOPRIL (PRINIVIL,ZESTRIL) 40 MG TABLET    TAKE 1 TABLET BY MOUTH DAILY   METFORMIN (GLUCOPHAGE) 1000 MG TABLET    TAKE 1 TABLET BY MOUTH TWICE DAILY WITH MEALS   METOPROLOL SUCCINATE (TOPROL-XL) 50 MG 24 HR TABLET    Take one tablet by mouth once daily   OMEPRAZOLE (PRILOSEC) 20 MG CAPSULE    TAKE 1 CAPSULE BY MOUTH DAILY   PHENAZOPYRIDINE (PYRIDIUM) 95 MG TABLET    Take 1 tablet (95 mg total) by mouth 3 (three) times daily as needed for pain.   POTASSIUM CHLORIDE SA (K-DUR,KLOR-CON) 20 MEQ TABLET    TAKE 1 TABLET(20 MEQ) BY MOUTH DAILY   PREGABALIN (LYRICA) 75 MG CAPSULE    Take one capsule by mouth three times daily for pains   TRADJENTA 5 MG TABS TABLET    TAKE 1 TABLET BY MOUTH EVERY DAY   TRIAMCINOLONE CREAM (KENALOG) 0.1 %    APPLY TOPICALLY THREE TIMES DAILY  Modified Medications   No medications on file  Discontinued Medications   No medications on file    Review of Systems  Constitutional: Negative for fever and chills.  Cardiovascular: Positive for leg swelling.  Musculoskeletal: Positive for joint swelling, arthralgias and gait problem.  Skin: Positive for color change, rash and wound.    Filed Vitals:   01/02/15 1401  BP: 140/84  Pulse: 83  Temp: 98.3 F (36.8 C)  TempSrc: Oral  Resp: 20  Height: 5\' 4"  (1.626 m)  Weight: 258 lb 3.2 oz (117.119 kg)  SpO2: 95%   Body mass index is 44.3 kg/(m^2).  Physical Exam  Constitutional: She appears well-developed and well-nourished. No distress.  Cardiovascular: Normal pulses.   Pulses:      Dorsalis pedis pulses are 2+ on the right side, and 2+ on the left side.       Posterior tibial  pulses are 2+ on the right side, and 2+ on the left side.  L>R +1 pitting LE edema. No calf TTP. Chronic venous stasis changes b/l legs  Skin:  Stage 2 left 1st toe plantar surface ulcer. No d/c. TTP. Min redness. Scaling noted. FROM  at MTP joint.     Labs reviewed: Office Visit on 01/01/2015  Component Date Value Ref Range Status  . Color, UA 01/01/2015 Yellow   Final  . Clarity, UA 01/01/2015 Cloudy   Final  . Glucose, UA 01/01/2015 Neg   Final  . Bilirubin, UA 01/01/2015 Neg   Final  . Ketones, UA 01/01/2015 Neg   Final  . Spec Grav, UA 01/01/2015 1.015   Final  . Blood, UA 01/01/2015 Large   Final  . pH, UA 01/01/2015 6.0   Final  . Protein, UA 01/01/2015 30   Final  . Urobilinogen, UA 01/01/2015 2.0   Final  . Nitrite, UA 01/01/2015 Neg   Final  . Leukocytes, UA 01/01/2015 moderate (2+)* Negative Final  Office Visit on 11/29/2014  Component Date Value Ref Range Status  . WBC 11/29/2014 9.2  3.4 - 10.8 x10E3/uL Final  . RBC 11/29/2014 4.23  3.77 - 5.28 x10E6/uL Final  . Hemoglobin 11/29/2014 12.8  11.1 - 15.9 g/dL Final  . Hematocrit 11/29/2014 38.1  34.0 - 46.6 % Final  . MCV 11/29/2014 90  79 - 97 fL Final  . MCH 11/29/2014 30.3  26.6 - 33.0 pg Final  . MCHC 11/29/2014 33.6  31.5 - 35.7 g/dL Final  . RDW 11/29/2014 14.8  12.3 - 15.4 % Final  . Platelets 11/29/2014 269  150 - 379 x10E3/uL Final  . Neutrophils 11/29/2014 56   Final  . Lymphs 11/29/2014 32   Final  . Monocytes 11/29/2014 8   Final  . Eos 11/29/2014 4   Final  . Basos 11/29/2014 0   Final  . Neutrophils Absolute 11/29/2014 5.1  1.4 - 7.0 x10E3/uL Final  . Lymphocytes Absolute 11/29/2014 3.0  0.7 - 3.1 x10E3/uL Final  . Monocytes Absolute 11/29/2014 0.7  0.1 - 0.9 x10E3/uL Final  . EOS (ABSOLUTE) 11/29/2014 0.4  0.0 - 0.4 x10E3/uL Final  . Basophils Absolute 11/29/2014 0.0  0.0 - 0.2 x10E3/uL Final  . Immature Granulocytes 11/29/2014 0   Final  . Immature Grans (Abs) 11/29/2014 0.0  0.0 - 0.1 x10E3/uL  Final  . Uric Acid 11/29/2014 5.5  2.5 - 7.1 mg/dL Final              Therapeutic target for gout patients: <6.0  . Glucose 11/29/2014 135* 65 - 99 mg/dL Final  . BUN 11/29/2014 17  8 - 27 mg/dL Final  . Creatinine, Ser 11/29/2014 0.79  0.57 - 1.00 mg/dL Final  . GFR calc non Af Amer 11/29/2014 77  >59 mL/min/1.73 Final  . GFR calc Af Amer 11/29/2014 89  >59 mL/min/1.73 Final  . BUN/Creatinine Ratio 11/29/2014 22  11 - 26 Final  . Sodium 11/29/2014 147* 134 - 144 mmol/L Final  . Potassium 11/29/2014 4.6  3.5 - 5.2 mmol/L Final  . Chloride 11/29/2014 102  97 - 108 mmol/L Final  . CO2 11/29/2014 25  18 - 29 mmol/L Final  . Calcium 11/29/2014 9.0  8.7 - 10.3 mg/dL Final    No results found.   Assessment/Plan   ICD-9-CM ICD-10-CM   1. Infected decubitus ulcer, stage II  707.00 L89.92 doxycycline (VIBRA-TABS) 100 MG tablet   707.22  AMB referral to wound care center     chlorhexidine (HIBICLENS) 4 % external liquid  2. Ulcer of left great toe due to diabetes mellitus ([facility]) 250.80 E11.621 AMB referral to wound care center   707.15 L97.529 chlorhexidine (HIBICLENS) 4 % external liquid  3. Type 2  diabetes mellitus with diabetic polyneuropathy, without long-term current use of insulin ([facility]) - borderline controlled 250.60 E11.42 AMB referral to wound care center   357.2    4. Localized edema - due to #5 782.3 R60.0 AMB referral to wound care center  5. Chronic venous insufficiency 459.81 I87.2 AMB referral to wound care center   Recommend she wears TED stockings daily to help with swelling   Wash left foot with  hibiclens daily and rinse off.  Will call with wound care appt  Take doxycycline twice daily x 10 days for foot infection  Complete Cipro for bladder infection  Continue other medications as ordered  Follow up as scheduled with Dr Lorelei Pont S. Perlie Gold  [facility] [address] [place], [place]  [address] [phone] Cell (Monday-Friday 8 AM - 5 PM) [phone] After 5 PM and follow prompts

## 2015-01-02 NOTE — Patient Instructions (Signed)
Wash left foot with  hibiclens daily and rinse off.  Will call with wound care appt  Take doxycycline twice daily x 10 days for foot infection  Complete Cipro for bladder infection  Continue other medications as ordered  Follow up as scheduled with Dr Mariea Clonts

## 2015-01-02 NOTE — Telephone Encounter (Signed)
Spoke with patient, patient states she canceled home health because they would not act right. Scheduled appointment to be seen today at 1:45 with Dr.Carter

## 2015-01-02 NOTE — Telephone Encounter (Signed)
Message left on triage voicemail. Patient was seen yesterday for UTI and given ABX (Cipro). Patient now has a concern about her toe being inflamed and very painful. Patient would like a return call for instructions.   I called patient for more information. Patient was seen in September for right toe pain by PCP. Patient now with left big toe pain " felt like it was on fire" patient was unable to sleep last night. Patient would like to know if the current ABX will help or if she needs something else.   Patient was informed that she may need OV for evaluation for this was not a concern at yesterday's visit. I will send message to PCP first and if instructed will forward to NP whom patient seen yesterday.

## 2015-01-02 NOTE — Telephone Encounter (Signed)
Did she ever get the home health services I ordered?  We had made a deal that she would agree to home health if she was not going to follow up with me on a weekly basis until her toe was completely healed.  She needs to be seen here if a nurse is not coming out looking at her.

## 2015-01-06 LAB — URINE CULTURE

## 2015-01-17 ENCOUNTER — Other Ambulatory Visit: Payer: Self-pay | Admitting: Nurse Practitioner

## 2015-01-17 ENCOUNTER — Telehealth: Payer: Self-pay | Admitting: Internal Medicine

## 2015-01-17 ENCOUNTER — Other Ambulatory Visit: Payer: Commercial Managed Care - HMO

## 2015-01-17 DIAGNOSIS — E7800 Pure hypercholesterolemia, unspecified: Secondary | ICD-10-CM

## 2015-01-17 NOTE — Telephone Encounter (Signed)
FYI - Patient not returning calls from our office or the [facility] to schedule - Mailed a letter to patient 12/17/14 about scheduling with no response Patient has an appointment with you on 01/21/15

## 2015-01-17 NOTE — Telephone Encounter (Signed)
I will discuss it with her at her visit.  She's very noncompliant with most of my recommendations.

## 2015-01-18 ENCOUNTER — Ambulatory Visit
Admission: RE | Admit: 2015-01-18 | Discharge: 2015-01-18 | Disposition: A | Discharge status: Home / Self Care | Payer: Medicare HMO | Source: Ambulatory Visit | Attending: Internal Medicine | Admitting: Internal Medicine

## 2015-01-18 ENCOUNTER — Other Ambulatory Visit: Payer: Self-pay | Admitting: Internal Medicine

## 2015-01-18 ENCOUNTER — Encounter (HOSPITAL_BASED_OUTPATIENT_CLINIC_OR_DEPARTMENT_OTHER): Payer: Commercial Managed Care - HMO | Attending: Internal Medicine

## 2015-01-18 DIAGNOSIS — L97811 Non-pressure chronic ulcer of other part of right lower leg limited to breakdown of skin: Secondary | ICD-10-CM | POA: Diagnosis not present

## 2015-01-18 DIAGNOSIS — L97821 Non-pressure chronic ulcer of other part of left lower leg limited to breakdown of skin: Secondary | ICD-10-CM | POA: Insufficient documentation

## 2015-01-18 DIAGNOSIS — L97521 Non-pressure chronic ulcer of other part of left foot limited to breakdown of skin: Secondary | ICD-10-CM | POA: Insufficient documentation

## 2015-01-18 DIAGNOSIS — E11622 Type 2 diabetes mellitus with other skin ulcer: Secondary | ICD-10-CM | POA: Insufficient documentation

## 2015-01-18 DIAGNOSIS — M1990 Unspecified osteoarthritis, unspecified site: Secondary | ICD-10-CM | POA: Diagnosis not present

## 2015-01-18 DIAGNOSIS — Z7984 Long term (current) use of oral hypoglycemic drugs: Secondary | ICD-10-CM | POA: Diagnosis not present

## 2015-01-18 DIAGNOSIS — I10 Essential (primary) hypertension: Secondary | ICD-10-CM | POA: Diagnosis not present

## 2015-01-18 DIAGNOSIS — L97529 Non-pressure chronic ulcer of other part of left foot with unspecified severity: Secondary | ICD-10-CM | POA: Diagnosis not present

## 2015-01-18 DIAGNOSIS — E11621 Type 2 diabetes mellitus with foot ulcer: Secondary | ICD-10-CM | POA: Diagnosis not present

## 2015-01-18 DIAGNOSIS — M869 Osteomyelitis, unspecified: Secondary | ICD-10-CM

## 2015-01-18 DIAGNOSIS — E1140 Type 2 diabetes mellitus with diabetic neuropathy, unspecified: Secondary | ICD-10-CM | POA: Diagnosis not present

## 2015-01-18 DIAGNOSIS — E1136 Type 2 diabetes mellitus with diabetic cataract: Secondary | ICD-10-CM | POA: Diagnosis not present

## 2015-01-18 DIAGNOSIS — M109 Gout, unspecified: Secondary | ICD-10-CM | POA: Diagnosis not present

## 2015-01-18 DIAGNOSIS — Z9849 Cataract extraction status, unspecified eye: Secondary | ICD-10-CM | POA: Insufficient documentation

## 2015-01-18 DIAGNOSIS — I87333 Chronic venous hypertension (idiopathic) with ulcer and inflammation of bilateral lower extremity: Secondary | ICD-10-CM | POA: Diagnosis not present

## 2015-01-18 DIAGNOSIS — L97523 Non-pressure chronic ulcer of other part of left foot with necrosis of muscle: Secondary | ICD-10-CM | POA: Diagnosis not present

## 2015-01-18 DIAGNOSIS — E1151 Type 2 diabetes mellitus with diabetic peripheral angiopathy without gangrene: Secondary | ICD-10-CM | POA: Diagnosis not present

## 2015-01-18 LAB — LIPID PANEL
Chol/HDL Ratio: 3.3 ratio units (ref 0.0–4.4)
Cholesterol, Total: 142 mg/dL (ref 100–199)
HDL: 43 mg/dL (ref >39)
LDL Calculated: 73 mg/dL (ref 0–99)
Triglycerides: 129 mg/dL (ref 0–149)
VLDL Cholesterol Cal: 26 mg/dL (ref 5–40)

## 2015-01-19 ENCOUNTER — Other Ambulatory Visit: Payer: Self-pay | Admitting: Internal Medicine

## 2015-01-21 ENCOUNTER — Encounter: Payer: Self-pay | Admitting: Internal Medicine

## 2015-01-21 ENCOUNTER — Ambulatory Visit (INDEPENDENT_AMBULATORY_CARE_PROVIDER_SITE_OTHER): Payer: Commercial Managed Care - HMO | Admitting: Internal Medicine

## 2015-01-21 VITALS — BP 120/86 | HR 80 | Temp 97.8°F | Resp 20 | Ht 64.0 in | Wt 260.6 lb

## 2015-01-21 DIAGNOSIS — L97529 Non-pressure chronic ulcer of other part of left foot with unspecified severity: Secondary | ICD-10-CM

## 2015-01-21 DIAGNOSIS — I872 Venous insufficiency (chronic) (peripheral): Secondary | ICD-10-CM | POA: Diagnosis not present

## 2015-01-21 DIAGNOSIS — E7800 Pure hypercholesterolemia, unspecified: Secondary | ICD-10-CM

## 2015-01-21 DIAGNOSIS — E1142 Type 2 diabetes mellitus with diabetic polyneuropathy: Secondary | ICD-10-CM

## 2015-01-21 DIAGNOSIS — E11621 Type 2 diabetes mellitus with foot ulcer: Secondary | ICD-10-CM

## 2015-01-21 NOTE — Progress Notes (Signed)
Patient ID: Debra [name], female   DOB: Sep 06, 1945, 69 y.o.   MRN: [id number] -  Location: [place] Provider: Rexene Edison. Mariea Clonts, D.O., C.M.D.  Code Status: full code Goals of Care: Advanced Directive information Does patient have an advance directive?: Yes  Chief Complaint  Patient presents with  . Medical Management of Chronic Issues    3 month follow-up for Hypertension, Hyperlipidemia, DM,     HPI: Patient is a 69 y.o. female seen in the office today for medical mgt of chronic diseases.  Notes extreme fatigue without having done anything.    Has been to wound center--has unnaboots.  Had debridement of left great toe--took cultures--not in system yet.  Has plans for venous study to be done off of [facility] Friday.  Saw Dr. Dellia Nims.  Xray did not reveal any osteomyelitis on 01/18/15.  Has appt with Dr. Dellia Nims again on Friday after the ultrasound.  Says she has had a bad week--septic problem, cold water issue.    Bad cholesterol improved gradually since last visit.   Sugar average has gone up.    Was not returning phone calls for mammogram to be done--refuses.    Weight has only gone up two lbs.  Not down.  Review of Systems:  Review of Systems  Constitutional: Negative for fever and chills.  HENT: Negative for congestion.   Eyes: Negative for blurred vision.  Respiratory: Negative for cough and shortness of breath.   Cardiovascular: Positive for leg swelling. Negative for chest pain and palpitations.  Gastrointestinal: Negative for abdominal pain.  Genitourinary: Positive for frequency. Negative for dysuria.  Musculoskeletal: Negative for myalgias and falls.  Neurological: Negative for dizziness.  Psychiatric/Behavioral: Negative for memory loss.    Past Medical History  Diagnosis Date  . Diabetes mellitus with neurological manifestation ([place])   . Hypertension goal BP (blood pressure) < 130/80   . Hyperlipidemia LDL goal < 100   . Osteopenia     DEXA scan  7/07  . Chronic venous insufficiency   . Morbid obesity ([place])   . Diverticulitis     h/o 2-3 episodes in past  . Leg edema     secondary to chronic venous insufficiency  . Cellulitis of left leg     history of, most recent episode 01/09    Past Surgical History  Procedure Laterality Date  . Appendectomy    . Abdominal hysterectomy    . Colectomy      with diverting colsotmy and revision in the 1990's for diverticulitis  . Eye surgery  8/13    left cataract removal & retinal repair  . Cholecystectomy    . Cholecystectomy N/A 04/14/2013    Procedure: LAPAROSCOPIC CHOLECYSTECTOMY WITH INTRAOPERATIVE CHOLANGIOGRAM;  Surgeon: Adin Hector, MD;  Location: [facility];  Service: General;  Laterality: N/A;  . Laparoscopic lysis of adhesions N/A 04/14/2013    Procedure: LAPAROSCOPIC LYSIS OF ADHESIONS;  Surgeon: Adin Hector, MD;  Location: [facility];  Service: General;  Laterality: N/A;  . Ercp N/A 04/17/2013    Procedure: ENDOSCOPIC RETROGRADE CHOLANGIOPANCREATOGRAPHY (ERCP);  Surgeon: Irene Shipper, MD;  Location: [facility];  Service: Endoscopy;  Laterality: N/A;  note pt want general anesthesia for this case.  preferto perform in endo unit, but if unavailable please arrange time in OR    Allergies  Allergen Reactions  . Aspirin Nausea Only    And causes bruises  . Banana Hives  . Hydrocodone Hives  . Neomycin-Polymyxin-Gramicidin Itching and Swelling  Eye drops caused swelling in face and rash and itching on arms and neck  . Penicillins Hives and Itching  . Tramadol Nausea And Vomiting  . Latex Hives and Rash      Medication List       This list is accurate as of: 01/21/15  4:21 PM.  Always use your most recent med list.               ACCU-CHEK AVIVA PLUS test strip  Generic drug:  glucose blood  USE AS DIRECTED TWICE DAILY     allopurinol 100 MG tablet  Commonly known as:  ZYLOPRIM  Take one tablet by mouth twice daily for gout     atorvastatin 40 MG tablet   Commonly known as:  LIPITOR  Take 1 tablet (40 mg total) by mouth daily. For High Cholesterol. Patient will take 2 of 20 mg tablet and then 40 mg once daily.     chlorhexidine 4 % external liquid  Commonly known as:  HIBICLENS  Apply to affected area daily and wash off.     diphenhydrAMINE 25 MG tablet  Commonly known as:  BENADRYL  Take 2 tablets (50 mg total) by mouth every 6 (six) hours as needed for itching.     furosemide 20 MG tablet  Commonly known as:  LASIX  Take 1 tablet (20 mg total) by mouth daily. For swelling     lisinopril 40 MG tablet  Commonly known as:  PRINIVIL,ZESTRIL  TAKE 1 TABLET BY MOUTH DAILY     LYRICA 75 MG capsule  Generic drug:  pregabalin  TAKE 1 CAPSULE BY MOUTH THREE TIMES DAILY FOR PAIN     metFORMIN 1000 MG tablet  Commonly known as:  GLUCOPHAGE  TAKE 1 TABLET BY MOUTH TWICE DAILY WITH MEALS     metoprolol succinate 50 MG 24 hr tablet  Commonly known as:  TOPROL-XL  Take one tablet by mouth once daily     omeprazole 20 MG capsule  Commonly known as:  PRILOSEC  TAKE 1 CAPSULE BY MOUTH DAILY     phenazopyridine 95 MG tablet  Commonly known as:  PYRIDIUM  Take 1 tablet (95 mg total) by mouth 3 (three) times daily as needed for pain.     potassium chloride SA 20 MEQ tablet  Commonly known as:  K-DUR,KLOR-CON  TAKE 1 TABLET(20 MEQ) BY MOUTH DAILY     TRADJENTA 5 MG Tabs tablet  Generic drug:  linagliptin  TAKE 1 TABLET BY MOUTH EVERY DAY     triamcinolone cream 0.1 %  Commonly known as:  KENALOG  APPLY TOPICALLY THREE TIMES DAILY        Health Maintenance  Topic Date Due  . Hepatitis C Screening  08-17-1945  . ZOSTAVAX  10/06/2006  . MAMMOGRAM  09/22/2012  . OPHTHALMOLOGY EXAM  03/14/2013  . FOOT EXAM  08/12/2014  . HEMOGLOBIN A1C  01/01/2015  . COLONOSCOPY  07/21/2016 (Originally 10/05/1996)  . PNA vac Low Risk Adult (2 of 2 - PPSV23) 02/23/2015  . INFLUENZA VACCINE  10/22/2015  . LIPID PANEL  01/17/2016  . TETANUS/TDAP   09/10/2020  . DEXA SCAN  Completed    Physical Exam: Filed Vitals:   01/21/15 1550  BP: 120/86  Pulse: 80  Temp: 97.8 F (36.6 C)  TempSrc: Oral  Resp: 20  Height: 5\' 4"  (1.626 m)  Weight: 260 lb 9.6 oz (118.207 kg)  SpO2: 95%   Body mass index is 44.71 kg/(m^2). Physical  Exam  Constitutional: She is oriented to person, place, and time.  Obese white female  Cardiovascular: Normal rate, regular rhythm and normal heart sounds.   Pulmonary/Chest: Effort normal and breath sounds normal. No respiratory distress.  Abdominal: Soft. Bowel sounds are normal.  Neurological: She is alert and oriented to person, place, and time.  Skin: Skin is warm and dry.  Left great toe with dressing and sandal in place--has severe swelling of great toe, bilateral lower legs with unnaboots in place    Labs reviewed: Basic Metabolic Panel:  Recent Labs  10/01/14 1125 11/29/14 1559  NA 141 147*  K 4.4 4.6  CL 99 102  CO2 23 25  GLUCOSE 130* 135*  BUN 16 17  CREATININE 0.76 0.79  CALCIUM 9.3 9.0   Liver Function Tests: No results for input(s): AST, ALT, ALKPHOS, BILITOT, PROT, ALBUMIN in the last 8760 hours. No results for input(s): LIPASE, AMYLASE in the last 8760 hours. No results for input(s): AMMONIA in the last 8760 hours. CBC:  Recent Labs  11/29/14 1559  WBC 9.2  NEUTROABS 5.1  HCT 38.1   Lipid Panel:  Recent Labs  10/01/14 1125 01/17/15 0839  CHOL 147 142  HDL 43 43  LDLCALC 77 73  TRIG 133 129  CHOLHDL 3.4 3.3   Lab Results  Component Value Date   HGBA1C 7.2* 10/01/2014   Assessment/Plan 1. Diabetic ulcer of left great toe ([place]) -await culture from toe obtained by wound care center - pt is noncompliant and did not go there when I initially sent her and also did not follow up here as directed -fortunately xray did not show osteomyelitis - CBC with Differential/Platelet; Future - Basic metabolic panel; Future  2. Type 2 diabetes mellitus with diabetic  polyneuropathy, without long-term current use of insulin ([facility]) -control slightly worse since her toe has been infected -cont current regimen and working on dietary changes, does not exercise - Basic metabolic panel; Future - Hemoglobin A1c; Future  3. Chronic venous insufficiency -edema severe -was having blistering and drainage and now is in unnaboots which she does not like -has orthopedic sandal on left foot and still wearing her far from diabetic sandal on her right foot -cont tx as per wound care center  - Basic metabolic panel; Future  4. Morbid obesity due to excess calories ([place]) - pt says she is working on her diet, but has not lost any weight and does not exercise - Basic metabolic panel; Future - Hemoglobin A1c; Future - Lipid panel; Future  5. High cholesterol - slight improvement from last visit--cont diet and exercise - Lipid panel; Future  Labs/tests ordered:   Orders Placed This Encounter  Procedures  . CBC with Differential/Platelet    Standing Status: Future     Number of Occurrences:      Standing Expiration Date: 07/21/2015  . Basic metabolic panel    Standing Status: Future     Number of Occurrences:      Standing Expiration Date: 07/21/2015    Order Specific Question:  Has the patient fasted?    Answer:  Yes  . Hemoglobin A1c    Standing Status: Future     Number of Occurrences:      Standing Expiration Date: 07/21/2015  . Lipid panel    Standing Status: Future     Number of Occurrences:      Standing Expiration Date: 07/21/2015    Order Specific Question:  Has the patient fasted?    Answer:  Yes    Next appt:  05/30/2015  Suttyn Cryder L. Jammie Clink, D.O. [facility] [address], [place] [address] Cell Phone (Mon-Fri 8am-5pm):  [phone] On Call:  [phone] & follow prompts after 5pm & weekends Office Phone:  [phone] Office Fax:  [phone]

## 2015-01-25 ENCOUNTER — Ambulatory Visit (INDEPENDENT_AMBULATORY_CARE_PROVIDER_SITE_OTHER)
Admission: RE | Admit: 2015-01-25 | Discharge: 2015-01-25 | Disposition: A | Discharge status: Home / Self Care | Payer: Commercial Managed Care - HMO | Source: Ambulatory Visit | Attending: Surgery | Admitting: Surgery

## 2015-01-25 ENCOUNTER — Other Ambulatory Visit: Payer: Self-pay | Admitting: Internal Medicine

## 2015-01-25 ENCOUNTER — Encounter (HOSPITAL_BASED_OUTPATIENT_CLINIC_OR_DEPARTMENT_OTHER): Payer: Commercial Managed Care - HMO | Attending: Internal Medicine

## 2015-01-25 ENCOUNTER — Ambulatory Visit (HOSPITAL_COMMUNITY)
Admission: RE | Admit: 2015-01-25 | Discharge: 2015-01-25 | Disposition: A | Discharge status: Home / Self Care | Payer: Commercial Managed Care - HMO | Source: Ambulatory Visit | Attending: Surgery | Admitting: Surgery

## 2015-01-25 DIAGNOSIS — E11621 Type 2 diabetes mellitus with foot ulcer: Secondary | ICD-10-CM | POA: Insufficient documentation

## 2015-01-25 DIAGNOSIS — E1151 Type 2 diabetes mellitus with diabetic peripheral angiopathy without gangrene: Secondary | ICD-10-CM | POA: Diagnosis not present

## 2015-01-25 DIAGNOSIS — L97811 Non-pressure chronic ulcer of other part of right lower leg limited to breakdown of skin: Secondary | ICD-10-CM | POA: Insufficient documentation

## 2015-01-25 DIAGNOSIS — M1990 Unspecified osteoarthritis, unspecified site: Secondary | ICD-10-CM | POA: Diagnosis not present

## 2015-01-25 DIAGNOSIS — L84 Corns and callosities: Secondary | ICD-10-CM | POA: Diagnosis not present

## 2015-01-25 DIAGNOSIS — L97929 Non-pressure chronic ulcer of unspecified part of left lower leg with unspecified severity: Secondary | ICD-10-CM

## 2015-01-25 DIAGNOSIS — I87333 Chronic venous hypertension (idiopathic) with ulcer and inflammation of bilateral lower extremity: Secondary | ICD-10-CM | POA: Diagnosis not present

## 2015-01-25 DIAGNOSIS — L97522 Non-pressure chronic ulcer of other part of left foot with fat layer exposed: Secondary | ICD-10-CM | POA: Diagnosis not present

## 2015-01-25 DIAGNOSIS — E1136 Type 2 diabetes mellitus with diabetic cataract: Secondary | ICD-10-CM | POA: Insufficient documentation

## 2015-01-25 DIAGNOSIS — E11622 Type 2 diabetes mellitus with other skin ulcer: Secondary | ICD-10-CM | POA: Insufficient documentation

## 2015-01-25 DIAGNOSIS — E1140 Type 2 diabetes mellitus with diabetic neuropathy, unspecified: Secondary | ICD-10-CM | POA: Diagnosis not present

## 2015-01-25 DIAGNOSIS — M109 Gout, unspecified: Secondary | ICD-10-CM | POA: Insufficient documentation

## 2015-01-25 DIAGNOSIS — I10 Essential (primary) hypertension: Secondary | ICD-10-CM | POA: Insufficient documentation

## 2015-01-25 DIAGNOSIS — R600 Localized edema: Secondary | ICD-10-CM | POA: Insufficient documentation

## 2015-01-26 ENCOUNTER — Other Ambulatory Visit: Payer: Self-pay | Admitting: Internal Medicine

## 2015-01-30 ENCOUNTER — Telehealth: Payer: Self-pay

## 2015-01-30 NOTE — Telephone Encounter (Signed)
I called patient to schedule appointment with Oretha Ellis to follow-up on diabetes. Patient refused to schedule stating she has a lot going on. Patient states she will call when she is ready to see Oretha Ellis for diabetic management.

## 2015-01-31 ENCOUNTER — Other Ambulatory Visit: Payer: Self-pay | Admitting: Internal Medicine

## 2015-02-01 DIAGNOSIS — E1140 Type 2 diabetes mellitus with diabetic neuropathy, unspecified: Secondary | ICD-10-CM | POA: Diagnosis not present

## 2015-02-01 DIAGNOSIS — E11621 Type 2 diabetes mellitus with foot ulcer: Secondary | ICD-10-CM | POA: Diagnosis not present

## 2015-02-01 DIAGNOSIS — E1136 Type 2 diabetes mellitus with diabetic cataract: Secondary | ICD-10-CM | POA: Diagnosis not present

## 2015-02-01 DIAGNOSIS — I10 Essential (primary) hypertension: Secondary | ICD-10-CM | POA: Diagnosis not present

## 2015-02-01 DIAGNOSIS — E11622 Type 2 diabetes mellitus with other skin ulcer: Secondary | ICD-10-CM | POA: Diagnosis not present

## 2015-02-01 DIAGNOSIS — I87333 Chronic venous hypertension (idiopathic) with ulcer and inflammation of bilateral lower extremity: Secondary | ICD-10-CM | POA: Diagnosis not present

## 2015-02-01 DIAGNOSIS — L97522 Non-pressure chronic ulcer of other part of left foot with fat layer exposed: Secondary | ICD-10-CM | POA: Diagnosis not present

## 2015-02-01 DIAGNOSIS — L97811 Non-pressure chronic ulcer of other part of right lower leg limited to breakdown of skin: Secondary | ICD-10-CM | POA: Diagnosis not present

## 2015-02-01 DIAGNOSIS — E1151 Type 2 diabetes mellitus with diabetic peripheral angiopathy without gangrene: Secondary | ICD-10-CM | POA: Diagnosis not present

## 2015-02-20 ENCOUNTER — Other Ambulatory Visit: Payer: Self-pay | Admitting: Nurse Practitioner

## 2015-02-21 ENCOUNTER — Other Ambulatory Visit: Payer: Self-pay | Admitting: Internal Medicine

## 2015-02-21 NOTE — Telephone Encounter (Signed)
Spoke with patient, patient confirmed that she is taking 50 mg and not 25 mg

## 2015-02-25 ENCOUNTER — Other Ambulatory Visit: Payer: Self-pay | Admitting: *Deleted

## 2015-02-25 DIAGNOSIS — I10 Essential (primary) hypertension: Secondary | ICD-10-CM

## 2015-02-25 MED ORDER — METOPROLOL SUCCINATE ER 50 MG PO TB24: ORAL_TABLET | ORAL | Status: DC

## 2015-02-25 NOTE — Telephone Encounter (Signed)
Patient called and requested refill to be faxed to pharmacy. 

## 2015-03-06 ENCOUNTER — Other Ambulatory Visit: Payer: Self-pay | Admitting: Internal Medicine

## 2015-03-27 ENCOUNTER — Other Ambulatory Visit: Payer: Self-pay | Admitting: Internal Medicine

## 2015-03-28 ENCOUNTER — Encounter: Payer: Self-pay | Admitting: Nurse Practitioner

## 2015-03-28 ENCOUNTER — Ambulatory Visit (INDEPENDENT_AMBULATORY_CARE_PROVIDER_SITE_OTHER): Payer: Commercial Managed Care - HMO | Admitting: Nurse Practitioner

## 2015-03-28 VITALS — BP 118/80 | HR 68 | Temp 97.4°F | Ht 64.0 in | Wt 256.0 lb

## 2015-03-28 DIAGNOSIS — M25561 Pain in right knee: Secondary | ICD-10-CM

## 2015-03-28 NOTE — Patient Instructions (Signed)
Ice knee 20-30 3 times a day Aleve 1 tablet twice daily for 1 week Xray of knee  Knee Pain Knee pain is a very common symptom and can have many causes. Knee pain often goes away when you follow your health care provider's instructions for relieving pain and discomfort at home. However, knee pain can develop into a condition that needs treatment. Some conditions may include:  Arthritis caused by wear and tear (osteoarthritis).  Arthritis caused by swelling and irritation (rheumatoid arthritis or gout).  A cyst or growth in your knee.  An infection in your knee joint.  An injury that will not heal.  Damage, swelling, or irritation of the tissues that support your knee (torn ligaments or tendinitis). If your knee pain continues, additional tests may be ordered to diagnose your condition. Tests may include X-rays or other imaging studies of your knee. You may also need to have fluid removed from your knee. Treatment for ongoing knee pain depends on the cause, but treatment may include:  Medicines to relieve pain or swelling.  Steroid injections in your knee.  Physical therapy.  Surgery. HOME CARE INSTRUCTIONS  Take medicines only as directed by your health care provider.  Rest your knee and keep it raised (elevated) while you are resting.  Do not do things that cause or worsen pain.  Avoid high-impact activities or exercises, such as running, jumping rope, or doing jumping jacks.  Apply ice to the knee area:  Put ice in a plastic bag.  Place a towel between your skin and the bag.  Leave the ice on for 20 minutes, 2-3 times a day.  Ask your health care provider if you should wear an elastic knee support.  Keep a pillow under your knee when you sleep.  Lose weight if you are overweight. Extra weight can put pressure on your knee.  Do not use any tobacco products, including cigarettes, chewing tobacco, or electronic cigarettes. If you need help quitting, ask your health  care provider. Smoking may slow the healing of any bone and joint problems that you may have. SEEK MEDICAL CARE IF:  Your knee pain continues, changes, or gets worse.  You have a fever along with knee pain.  Your knee buckles or locks up.  Your knee becomes more swollen. SEEK IMMEDIATE MEDICAL CARE IF:   Your knee joint feels hot to the touch.  You have chest pain or trouble breathing.   This information is not intended to replace advice given to you by your health care provider. Make sure you discuss any questions you have with your health care provider.   Document Released: 01/04/2007 Document Revised: 03/30/2014 Document Reviewed: 10/23/2013 Elsevier Interactive Patient Education [employer].

## 2015-03-28 NOTE — Progress Notes (Signed)
Patient ID: [name], female   DOB: [birth year], 70 y.o.   MRN: [id number]    PCP: Hollace Kinnier, DO   Allergies  Allergen Reactions  . Aspirin Nausea Only    And causes bruises  . Banana Hives  . Hydrocodone Hives  . Neomycin-Polymyxin-Gramicidin Itching and Swelling    Eye drops caused swelling in face and rash and itching on arms and neck  . Penicillins Hives and Itching  . Tramadol Nausea And Vomiting  . Latex Hives and Rash    Chief Complaint  Patient presents with  . Acute Visit    Right Knee pain x 1.5 months. No known injury. Patient states " I feel like I'm going to fall"  . Medication Management    Discuss Allopurinol, patient was told to stop medication by the foot doctor. Patient was told she does not have gout.      HPI: [name] seen in the office today due to right knee pain. Reports knee pain started 3-4 weeks ago. Only pain in right knee. No injury to knee. Feels like it is because she stopped allopurinol. Stopped allopurinol in September.  Feels like her knee is killing her. 9/10. Feels like it is closing up. Feels like she is unstable on knee. Took some advil which did help. No other problems with arthritis or problems with her knee or other joints.  No swelling or heat to joint.   intolerant to ASA but takes other NSAIDs, makes her urine turn red. Able to take advil and aleve.   Review of Systems:  Review of Systems  Constitutional: Negative for fever and chills.  HENT: Negative for congestion.   Respiratory: Negative for cough and shortness of breath.   Cardiovascular: Positive for leg swelling. Negative for chest pain and palpitations.  Musculoskeletal: Positive for arthralgias (right knee pain) and gait problem. Negative for myalgias and joint swelling.  Neurological: Negative for dizziness.    Past Medical History  Diagnosis Date  . 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 Hypertension goal BP (blood  pressure) < 130/80   . Hyperlipidemia LDL goal < 100   . Osteopenia     DEXA scan 7/07  . Chronic venous insufficiency   . Morbid obesity ([place])   . Diverticulitis     h/o 2-3 episodes in past  . Leg edema     secondary to chronic venous insufficiency  . Cellulitis of left leg     history of, most recent episode 01/09   Past Surgical History  Procedure Laterality Date  . Appendectomy    . Abdominal hysterectomy    . Colectomy      with diverting colsotmy and revision in the 1990's for diverticulitis  . Eye surgery  8/13    left cataract removal & retinal repair  . Cholecystectomy    . Cholecystectomy N/A 04/14/2013    Procedure: LAPAROSCOPIC CHOLECYSTECTOMY WITH INTRAOPERATIVE CHOLANGIOGRAM;  Surgeon: Adin Hector, MD;  Location: [facility];  Service: General;  Laterality: N/A;  . Laparoscopic lysis of adhesions N/A 04/14/2013    Procedure: LAPAROSCOPIC LYSIS OF ADHESIONS;  Surgeon: Adin Hector, MD;  Location: [facility];  Service: General;  Laterality: N/A;  . Ercp N/A 04/17/2013    Procedure: ENDOSCOPIC RETROGRADE CHOLANGIOPANCREATOGRAPHY (ERCP);  Surgeon: Irene Shipper, MD;  Location: [facility];  Service: Endoscopy;  Laterality: N/A;  note pt want general anesthesia for this case.  preferto perform in endo unit, but  if unavailable please arrange time in OR   Social History:   reports that she has never smoked. She has never used smokeless tobacco. She reports that she does not drink alcohol or use illicit drugs.  Family History  Problem Relation Age of Onset  . Heart disease Mother   . Heart disease Father   . Heart disease Brother   . Cancer Brother   . Cancer Brother   . Hypertension Sister   . Heart disease Sister     Medications: Patient's Medications  New Prescriptions   No medications on file  Previous Medications   ACCU-CHEK AVIVA PLUS TEST STRIP    USE AS DIRECTED TWICE DAILY   ATORVASTATIN (LIPITOR) 40 MG TABLET    Take 1 tablet (40 mg total) by mouth  daily. For High Cholesterol. Patient will take 2 of 20 mg tablet and then 40 mg once daily.   CHLORHEXIDINE (HIBICLENS) 4 % EXTERNAL LIQUID    Apply to affected area daily and wash off.   DIPHENHYDRAMINE (BENADRYL) 25 MG TABLET    Take 2 tablets (50 mg total) by mouth every 6 (six) hours as needed for itching.   FUROSEMIDE (LASIX) 20 MG TABLET    TAKE 1 TABLET(20 MG) BY MOUTH DAILY FOR SWELLING   LISINOPRIL (PRINIVIL,ZESTRIL) 40 MG TABLET    TAKE 1 TABLET BY MOUTH DAILY   LYRICA 75 MG CAPSULE    TAKE 1 CAPSULE BY MOUTH THREE TIMES DAILY FOR PAIN   METFORMIN (GLUCOPHAGE) 1000 MG TABLET    TAKE 1 TABLET BY MOUTH TWICE DAILY WITH MEALS   METOPROLOL SUCCINATE (TOPROL-XL) 50 MG 24 HR TABLET    Take one tablet by mouth once daily   OMEPRAZOLE (PRILOSEC) 20 MG CAPSULE    TAKE 1 CAPSULE BY MOUTH DAILY   POTASSIUM CHLORIDE SA (K-DUR,KLOR-CON) 20 MEQ TABLET    TAKE 1 TABLET(20 MEQ) BY MOUTH DAILY   TRADJENTA 5 MG TABS TABLET    TAKE 1 TABLET BY MOUTH EVERY DAY   TRIAMCINOLONE CREAM (KENALOG) 0.1 %    APPLY TOPICALLY THREE TIMES DAILY  Modified Medications   No medications on file  Discontinued Medications   ALLOPURINOL (ZYLOPRIM) 100 MG TABLET    Take one tablet by mouth twice daily for gout   FUROSEMIDE (LASIX) 20 MG TABLET    TAKE 1 TABLET(20 MG) BY MOUTH DAILY FOR SWELLING   OMEPRAZOLE (PRILOSEC) 20 MG CAPSULE    TAKE 1 CAPSULE BY MOUTH DAILY   PHENAZOPYRIDINE (PYRIDIUM) 95 MG TABLET    Take 1 tablet (95 mg total) by mouth 3 (three) times daily as needed for pain.     Physical Exam:  Filed Vitals:   03/28/15 1623  BP: 118/80  Pulse: 68  Temp: 97.4 F (36.3 C)  TempSrc: Oral  Height: 5\' 4"  (1.626 m)  Weight: 256 lb (116.121 kg)  SpO2: 96%   Body mass index is 43.92 kg/(m^2).  Physical Exam  Constitutional: She appears well-developed and well-nourished.  Cardiovascular: Normal rate, regular rhythm and normal heart sounds.   Pulmonary/Chest: Effort normal and breath sounds normal.    Musculoskeletal: She exhibits edema (2+ edema bilaterally).       Right knee: She exhibits normal range of motion, no swelling, no effusion, normal alignment and no LCL laxity. Tenderness (with movement) found. No medial joint line tenderness noted.    Labs reviewed: Basic Metabolic Panel:  Recent Labs  10/01/14 1125 11/29/14 1559  NA 141 147*  K 4.4 4.6  CL 99 102  CO2 23 25  GLUCOSE 130* 135*  BUN 16 17  CREATININE 0.76 0.79  CALCIUM 9.3 9.0   Liver Function Tests: No results for input(s): AST, ALT, ALKPHOS, BILITOT, PROT, ALBUMIN in the last 8760 hours. No results for input(s): LIPASE, AMYLASE in the last 8760 hours. No results for input(s): AMMONIA in the last 8760 hours. CBC:  Recent Labs  11/29/14 1559  WBC 9.2  NEUTROABS 5.1  HCT 38.1   Lipid Panel:  Recent Labs  10/01/14 1125 01/17/15 0839  CHOL 147 142  HDL 43 43  LDLCALC 77 73  TRIG 133 129  CHOLHDL 3.4 3.3   TSH: No results for input(s): TSH in the last 8760 hours. A1C: Lab Results  Component Value Date   HGBA1C 7.2* 10/01/2014     Assessment/Plan 1. Right knee pain -most likely related to OA, does not appear to be related to gout -will get DG Knee Complete 4 Views Right for further evaluation  - to ice knee 3 times a day for 20 mins -aleve 1 tablet twice daily  -to cont to use cane for support -call if worsening pain or no improvement after 1 week, may need PT evaluation   Jessica K. Harle Battiest  [facility] [phone] 8 am - 5 pm) [phone] (after hours)

## 2015-04-16 ENCOUNTER — Other Ambulatory Visit: Payer: Self-pay | Admitting: Internal Medicine

## 2015-04-21 ENCOUNTER — Other Ambulatory Visit: Payer: Self-pay | Admitting: Internal Medicine

## 2015-04-30 ENCOUNTER — Other Ambulatory Visit: Payer: Self-pay | Admitting: Internal Medicine

## 2015-05-01 ENCOUNTER — Other Ambulatory Visit: Payer: Self-pay | Admitting: *Deleted

## 2015-05-01 MED ORDER — PREGABALIN 75 MG PO CAPS: ORAL_CAPSULE | ORAL | Status: DC

## 2015-05-01 NOTE — Telephone Encounter (Signed)
Patient requested refill to [facility]

## 2015-05-02 ENCOUNTER — Other Ambulatory Visit: Payer: Self-pay | Admitting: Internal Medicine

## 2015-05-08 ENCOUNTER — Telehealth: Payer: Self-pay

## 2015-05-08 NOTE — Telephone Encounter (Signed)
Letter mailed to patient regarding overdue mammogram. M.Simpson 

## 2015-05-09 ENCOUNTER — Encounter: Payer: Self-pay | Admitting: Internal Medicine

## 2015-05-21 ENCOUNTER — Telehealth: Payer: Self-pay | Admitting: *Deleted

## 2015-05-21 DIAGNOSIS — Z1211 Encounter for screening for malignant neoplasm of colon: Secondary | ICD-10-CM

## 2015-05-21 NOTE — Telephone Encounter (Signed)
Patient called and stated that she received a letter that she needed an FOBT. Would like this mailed to her. Mailed and order placed.

## 2015-05-23 ENCOUNTER — Other Ambulatory Visit: Payer: Self-pay | Admitting: Internal Medicine

## 2015-05-30 ENCOUNTER — Other Ambulatory Visit: Payer: Commercial Managed Care - HMO

## 2015-06-03 ENCOUNTER — Encounter: Payer: Commercial Managed Care - HMO | Admitting: Internal Medicine

## 2015-06-07 ENCOUNTER — Other Ambulatory Visit: Payer: Commercial Managed Care - HMO

## 2015-06-10 ENCOUNTER — Encounter: Payer: Commercial Managed Care - HMO | Admitting: Internal Medicine

## 2015-06-10 ENCOUNTER — Other Ambulatory Visit: Payer: Commercial Managed Care - HMO

## 2015-06-10 DIAGNOSIS — Z1211 Encounter for screening for malignant neoplasm of colon: Secondary | ICD-10-CM

## 2015-06-11 ENCOUNTER — Other Ambulatory Visit: Payer: Self-pay | Admitting: Internal Medicine

## 2015-06-12 ENCOUNTER — Other Ambulatory Visit: Payer: Self-pay | Admitting: *Deleted

## 2015-06-12 MED ORDER — METFORMIN HCL 1000 MG PO TABS: 1000.0000 mg | ORAL_TABLET | Freq: Two times a day (BID) | ORAL | Status: DC

## 2015-06-12 NOTE — Telephone Encounter (Signed)
Patient called and requested to be sent to pharmacy.

## 2015-06-13 LAB — FECAL OCCULT BLOOD, IMMUNOCHEMICAL: Fecal Occult Bld: NEGATIVE

## 2015-06-14 ENCOUNTER — Encounter: Payer: Self-pay | Admitting: *Deleted

## 2015-07-01 ENCOUNTER — Other Ambulatory Visit: Payer: Self-pay | Admitting: Internal Medicine

## 2015-07-04 ENCOUNTER — Ambulatory Visit (INDEPENDENT_AMBULATORY_CARE_PROVIDER_SITE_OTHER): Payer: Commercial Managed Care - HMO | Admitting: Internal Medicine

## 2015-07-04 ENCOUNTER — Other Ambulatory Visit: Payer: Self-pay | Admitting: Internal Medicine

## 2015-07-04 ENCOUNTER — Encounter: Payer: Self-pay | Admitting: Internal Medicine

## 2015-07-04 VITALS — BP 158/92 | HR 73 | Temp 98.0°F | Ht 64.0 in | Wt 260.0 lb

## 2015-07-04 DIAGNOSIS — M8580 Other specified disorders of bone density and structure, unspecified site: Secondary | ICD-10-CM | POA: Diagnosis not present

## 2015-07-04 DIAGNOSIS — R1032 Left lower quadrant pain: Secondary | ICD-10-CM | POA: Diagnosis not present

## 2015-07-04 DIAGNOSIS — E1165 Type 2 diabetes mellitus with hyperglycemia: Secondary | ICD-10-CM

## 2015-07-04 DIAGNOSIS — L97529 Non-pressure chronic ulcer of other part of left foot with unspecified severity: Secondary | ICD-10-CM | POA: Diagnosis not present

## 2015-07-04 DIAGNOSIS — E1169 Type 2 diabetes mellitus with other specified complication: Secondary | ICD-10-CM

## 2015-07-04 DIAGNOSIS — E785 Hyperlipidemia, unspecified: Secondary | ICD-10-CM | POA: Diagnosis not present

## 2015-07-04 DIAGNOSIS — I872 Venous insufficiency (chronic) (peripheral): Secondary | ICD-10-CM

## 2015-07-04 DIAGNOSIS — IMO0002 Reserved for concepts with insufficient information to code with codable children: Secondary | ICD-10-CM

## 2015-07-04 DIAGNOSIS — E1140 Type 2 diabetes mellitus with diabetic neuropathy, unspecified: Secondary | ICD-10-CM

## 2015-07-04 DIAGNOSIS — L03115 Cellulitis of right lower limb: Secondary | ICD-10-CM

## 2015-07-04 DIAGNOSIS — E669 Obesity, unspecified: Secondary | ICD-10-CM

## 2015-07-04 DIAGNOSIS — E11621 Type 2 diabetes mellitus with foot ulcer: Secondary | ICD-10-CM | POA: Diagnosis not present

## 2015-07-04 MED ORDER — DOXYCYCLINE HYCLATE 100 MG PO TABS: 100.0000 mg | ORAL_TABLET | Freq: Two times a day (BID) | ORAL | Status: DC

## 2015-07-04 NOTE — Progress Notes (Signed)
Patient ID: Debra [name], female   DOB: 10/28/1945, 70 y.o.   MRN: [id number]   Location:  [facility] clinic Provider:  Hever Castilleja L. Mariea Clonts, D.O., C.M.D.  Code Status: full code Goals of Care:  Advanced Directives 07/04/2015  Does patient have an advance directive? No  Copy of advanced directive(s) in chart? -    Chief Complaint  Patient presents with  . Medical Management of Chronic Issues    leg pain, discuss hernia    HPI: [name] seen today for medical management of chronic diseases.    Left sided abdominal pain.  Not bothersome before.  Worse after meals--feels nauseous.  Doesn't matter what it is.  Even if Debra [name] has a sandwich and a small cup of milk--felt like someone put an ice cube on her belly.    Has not been using salt since Debra [name] saw me last.  Says her edema is better.  Also trying to elevate the feet.    Insurance company would not pay for her to go to wound care center, Debra [name] says.    Yesterday screen door got the top of her foot.  A day or so before, Debra [name] mashed her little toe on the left foot also and didn't feel it just saw the blood all over the floor.  Debra [name] needs to make her mammogram appt and has not. Debra [name] needs to go to her eye doctor at [facility] [place] for her retina exam and has not done this either. Debra [name] says Debra [name] is watching her salt and elevating her feet but weight up 4lbs and legs 3+ edema.  BP was elevated and got no better with recheck. Refuses further cscopes.   Past Medical History  Diagnosis Date  . 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 Hypertension goal BP (blood pressure) < 130/80   . Hyperlipidemia LDL goal < 100   . Osteopenia     DEXA scan 7/07  . Chronic venous insufficiency   . Morbid obesity ([place])   . Diverticulitis     h/o 2-3 episodes in past  . Leg edema     secondary to chronic venous insufficiency  . Cellulitis of left leg     history of, most recent episode 01/09    Past Surgical History    Procedure Laterality Date  . Appendectomy    . Abdominal hysterectomy    . Colectomy      with diverting colsotmy and revision in the 1990's for diverticulitis  . Eye surgery  8/13    left cataract removal & retinal repair  . Cholecystectomy    . Cholecystectomy N/A 04/14/2013    Procedure: LAPAROSCOPIC CHOLECYSTECTOMY WITH INTRAOPERATIVE CHOLANGIOGRAM;  Surgeon: Adin Hector, MD;  Location: [facility];  Service: General;  Laterality: N/A;  . Laparoscopic lysis of adhesions N/A 04/14/2013    Procedure: LAPAROSCOPIC LYSIS OF ADHESIONS;  Surgeon: Adin Hector, MD;  Location: [facility];  Service: General;  Laterality: N/A;  . Ercp N/A 04/17/2013    Procedure: ENDOSCOPIC RETROGRADE CHOLANGIOPANCREATOGRAPHY (ERCP);  Surgeon: Irene Shipper, MD;  Location: [facility];  Service: Endoscopy;  Laterality: N/A;  note pt want general anesthesia for this case.  preferto perform in endo unit, but if unavailable please arrange time in OR    Allergies  Allergen Reactions  . Aspirin Nausea Only    And causes bruises  . Banana Hives  . Hydrocodone Hives  . Neomycin-Polymyxin-Gramicidin Itching and Swelling  Eye drops caused swelling in face and rash and itching on arms and neck  . Penicillins Hives and Itching  . Tramadol Nausea And Vomiting  . Latex Hives and Rash      Medication List       This list is accurate as of: 07/04/15  3:49 PM.  Always use your most recent med list.               ACCU-CHEK AVIVA PLUS test strip  Generic drug:  glucose blood  USE AS DIRECTED TWICE DAILY     atorvastatin 40 MG tablet  Commonly known as:  LIPITOR  TAKE 1 TABLET BY MOUTH EVERY DAY     furosemide 20 MG tablet  Commonly known as:  LASIX  TAKE 1 TABLET(20 MG) BY MOUTH DAILY FOR SWELLING     lisinopril 40 MG tablet  Commonly known as:  PRINIVIL,ZESTRIL  TAKE 1 TABLET BY MOUTH DAILY     LYRICA 75 MG capsule  Generic drug:  pregabalin  TAKE 1 CAPSULE BY MOUTH THREE TIMES DAILY AS NEEDED FOR  PAIN     metFORMIN 1000 MG tablet  Commonly known as:  GLUCOPHAGE  Take 1 tablet (1,000 mg total) by mouth 2 (two) times daily with a meal.     metoprolol succinate 50 MG 24 hr tablet  Commonly known as:  TOPROL-XL  Take one tablet by mouth once daily     omeprazole 20 MG capsule  Commonly known as:  PRILOSEC  TAKE 1 CAPSULE BY MOUTH DAILY     potassium chloride SA 20 MEQ tablet  Commonly known as:  K-DUR,KLOR-CON  TAKE 1 TABLET(20 MEQ) BY MOUTH DAILY     TRADJENTA 5 MG Tabs tablet  Generic drug:  linagliptin  TAKE 1 TABLET BY MOUTH EVERY DAY     triamcinolone cream 0.1 %  Commonly known as:  KENALOG  APPLY TOPICALLY THREE TIMES DAILY       Review of Systems:  Review of Systems  Constitutional: Negative for fever, chills, weight loss and malaise/fatigue.  HENT: Negative for congestion.   Respiratory: Negative for shortness of breath.   Cardiovascular: Positive for leg swelling. Negative for chest pain.       Debra [name] says her legs are better  Gastrointestinal: Positive for nausea and abdominal pain. Negative for heartburn, vomiting, diarrhea, constipation, blood in stool and melena.       Umbilical and other anterior wall hernia per pt  Genitourinary: Negative for dysuria.  Musculoskeletal: Positive for joint pain. Negative for falls.  Skin: Positive for rash.       Right leg  Neurological: Negative for dizziness, loss of consciousness and weakness.  Psychiatric/Behavioral: Negative for depression and memory loss.    Health Maintenance  Topic Date Due  . Hepatitis C Screening  May 03, 1945  . ZOSTAVAX  10/06/2006  . MAMMOGRAM  09/22/2012  . OPHTHALMOLOGY EXAM  03/14/2013  . FOOT EXAM  08/12/2014  . HEMOGLOBIN A1C  01/01/2015  . PNA vac Low Risk Adult (2 of 2 - PPSV23) 02/23/2015  . COLONOSCOPY  07/21/2016 (Originally 10/05/1996)  . INFLUENZA VACCINE  10/22/2015  . LIPID PANEL  01/17/2016  . TETANUS/TDAP  09/10/2020  . DEXA SCAN  Completed    Physical Exam: Filed  Vitals:   07/04/15 1524  BP: 148/70  Pulse: 73  Temp: 98 F (36.7 C)  TempSrc: Oral  Height: 5\' 4"  (1.626 m)  Weight: 260 lb (117.935 kg)  SpO2: 97%   Body mass  index is 44.61 kg/(m^2). Physical Exam  Constitutional: Debra [name] is oriented to person, place, and time. No distress.  Cardiovascular: Normal rate, regular rhythm and normal heart sounds.   3+ pitting edema of bilateral legs and feet   Pulmonary/Chest: Effort normal and breath sounds normal. Debra [name] has no rales.  Abdominal: Soft. Bowel sounds are normal. There is tenderness. There is no rebound and no guarding. A hernia is present.  Tenderness is LLQ; has umbilical hernia visible; also has more prominent area in left lower quadrant region which Debra [name] says Debra [name]'s been told is also a hernia but I cannot feel where it goes to reduce it  Neurological: Debra [name] is alert and oriented to person, place, and time.  Skin:  Right leg with multiple open excoriated areas from ruptured blisters, some are bleeding, other oozing serous fluid; left leg with only chronic venous changes; left dorsal foot with small skin tear (from screen door); left 5th toe with blood blister on tip and protruding beneath the toenail; finally, her left great toe ulcer has worsened--has a hole in the toe--I could not quite probe bone (still had some soft tissue at tip of cotton tip applicator), appears it is trying to heal from inside the deep hole with dark red eschar internally; pt lacks sensation in both feet to light touch; only feels cold or being pinched    Labs reviewed: Basic Metabolic Panel:  Recent Labs  10/01/14 1125 11/29/14 1559  NA 141 147*  K 4.4 4.6  CL 99 102  CO2 23 25  GLUCOSE 130* 135*  BUN 16 17  CREATININE 0.76 0.79  CALCIUM 9.3 9.0   Liver Function Tests: No results for input(s): AST, ALT, ALKPHOS, BILITOT, PROT, ALBUMIN in the last 8760 hours. No results for input(s): LIPASE, AMYLASE in the last 8760 hours. No results for input(s): AMMONIA in  the last 8760 hours. CBC:  Recent Labs  11/29/14 1559  WBC 9.2  NEUTROABS 5.1  HCT 38.1  MCV 90  PLT 269   Lipid Panel:  Recent Labs  10/01/14 1125 01/17/15 0839  CHOL 147 142  HDL 43 43  LDLCALC 77 73  TRIG 133 129  CHOLHDL 3.4 3.3   Lab Results  Component Value Date   HGBA1C 7.2* 10/01/2014   Assessment/Plan 1. Type 2 diabetes, uncontrolled, with neuropathy (HCC) - by the time we got her wound situation sorted out, Debra [name] did not have time to get her labs so Debra [name] will need to do them when Debra [name] returns Monday for right leg compression wrap to be removed: - Hemoglobin A1c - CBC with Differential/Platelet - Comprehensive metabolic panel - has been under fair control despite her lack of exercise and poor dietary choices--Debra [name] will say Debra [name] does what Debra [name]'s supposed to but her exam findings and labs typically do not support that  2. Chronic venous insufficiency -right leg wound were dressed with telfas, then wrapped with unnaboot for the weekend to help with swelling  -says Debra [name] is not eating salty food or adding salt to her food but weight up and edema suggest otherwise  3. Osteopenia -does not follow through with bone density and mammo as recommended  4. Hyperlipidemia associated with type 2 diabetes mellitus (HCC) -lipids need rechecking, cont lipitor and dietary changes  5.  Morbid obesity due to excess calories ([place]) -only worsening with her sedentary lifestyle and noncompliance  6. Abdominal pain, left lower quadrant - unclear to me if this is truly due to a hernia that is no  longer reducible vs. Diverticular disease - obtain imaging to assess: - CT Abdomen Pelvis Wo Contrast; Future  8. Cellulitis of leg, right - started her on doxycycline today due to the right leg cellulitis -Debra [name] is nearing potential osteomyelitis territory also with her left great toe as the wound is gradually tunneling into her toe -I debated starting the abx in case [place] wanted to biopsy the  bone in the toe, but I didn't think bone was truly "probeable" at this time  9. Diabetic ulcer of left great toe ([place]) -is worsening -Debra [name] is nearing osteo territory -apparently Debra [name] stopped going to the [facility] due to some paperwork received from [place] suggesting they would not cover the service -explained that Debra [name] needs to show that to the staff there so this can be addressed rather than canceling appts and letting her condition continue to worsen -toe was wrapped with kerlix and dressing also applied to small skin tear on dorsum of foot  Labs/tests ordered:   Orders Placed This Encounter  Procedures  . CT Abdomen Pelvis Wo Contrast    Standing Status: Future     Number of Occurrences:      Standing Expiration Date: 10/02/2016    Order Specific Question:  Reason for Exam (SYMPTOM  OR DIAGNOSIS REQUIRED)    Answer:  left lower quadrant abdominal pain, hernia    Order Specific Question:  Preferred imaging location?    Answer:  [facility]  . Hemoglobin A1c  . CBC with Differential/Platelet  . Comprehensive metabolic panel    Order Specific Question:  Has the patient fasted?    Answer:  Yes   Next appt:  CMA Monday for dressing changes--I will be here and look at wounds  At least 40 mins was spent with pt on education and wound care today.  Shannell Mikkelsen L. Rhylee Pucillo, D.O. [facility] [address], [place] [address] Cell Phone (Mon-Fri 8am-5pm):  [phone] On Call:  [phone] & follow prompts after 5pm & weekends Office Phone:  [phone] Office Fax:  [phone]

## 2015-07-05 ENCOUNTER — Encounter: Payer: Self-pay | Admitting: Internal Medicine

## 2015-07-08 ENCOUNTER — Other Ambulatory Visit: Payer: Commercial Managed Care - HMO

## 2015-07-08 ENCOUNTER — Telehealth: Payer: Self-pay

## 2015-07-08 ENCOUNTER — Ambulatory Visit (INDEPENDENT_AMBULATORY_CARE_PROVIDER_SITE_OTHER): Payer: Commercial Managed Care - HMO | Admitting: Internal Medicine

## 2015-07-08 VITALS — BP 128/82 | HR 62 | Temp 97.7°F | Ht 64.0 in | Wt 260.2 lb

## 2015-07-08 DIAGNOSIS — E11621 Type 2 diabetes mellitus with foot ulcer: Secondary | ICD-10-CM | POA: Diagnosis not present

## 2015-07-08 DIAGNOSIS — E1142 Type 2 diabetes mellitus with diabetic polyneuropathy: Secondary | ICD-10-CM | POA: Diagnosis not present

## 2015-07-08 DIAGNOSIS — I872 Venous insufficiency (chronic) (peripheral): Secondary | ICD-10-CM | POA: Diagnosis not present

## 2015-07-08 DIAGNOSIS — L97529 Non-pressure chronic ulcer of other part of left foot with unspecified severity: Secondary | ICD-10-CM | POA: Diagnosis not present

## 2015-07-08 DIAGNOSIS — L97509 Non-pressure chronic ulcer of other part of unspecified foot with unspecified severity: Secondary | ICD-10-CM | POA: Diagnosis not present

## 2015-07-08 DIAGNOSIS — L03115 Cellulitis of right lower limb: Secondary | ICD-10-CM

## 2015-07-08 DIAGNOSIS — E7800 Pure hypercholesterolemia, unspecified: Secondary | ICD-10-CM

## 2015-07-08 NOTE — Progress Notes (Signed)
Spoke with Debra [name] about going to the wound care clinic. Patient said she did not want to go back there because she did not like having her foot debrided . I tried to explain to her the importance of the wound care treatment and that if not treated right she could loose the toe or even the foot if not cared for properly. I also informed her  that we are not equipt  to handle her type of wound care

## 2015-07-08 NOTE — Telephone Encounter (Signed)
Error

## 2015-07-09 ENCOUNTER — Encounter: Payer: Self-pay | Admitting: Internal Medicine

## 2015-07-09 LAB — CBC WITH DIFFERENTIAL/PLATELET
Basophils Absolute: 0 10*3/uL (ref 0.0–0.2)
Basos: 1 %
EOS (ABSOLUTE): 0.2 10*3/uL (ref 0.0–0.4)
Eos: 4 %
Hematocrit: 41.6 % (ref 34.0–46.6)
Hemoglobin: 13.7 g/dL (ref 11.1–15.9)
Immature Grans (Abs): 0 10*3/uL (ref 0.0–0.1)
Immature Granulocytes: 0 %
Lymphocytes Absolute: 2.5 10*3/uL (ref 0.7–3.1)
Lymphs: 39 %
MCH: 30.1 pg (ref 26.6–33.0)
MCHC: 32.9 g/dL (ref 31.5–35.7)
MCV: 91 fL (ref 79–97)
Monocytes Absolute: 0.5 10*3/uL (ref 0.1–0.9)
Monocytes: 8 %
Neutrophils Absolute: 3 10*3/uL (ref 1.4–7.0)
Neutrophils: 48 %
Platelets: 268 10*3/uL (ref 150–379)
RBC: 4.55 x10E6/uL (ref 3.77–5.28)
RDW: 14.7 % (ref 12.3–15.4)
WBC: 6.2 10*3/uL (ref 3.4–10.8)

## 2015-07-09 LAB — BASIC METABOLIC PANEL
BUN/Creatinine Ratio: 28 (ref 12–28)
BUN: 21 mg/dL (ref 8–27)
CO2: 23 mmol/L (ref 18–29)
Calcium: 8.6 mg/dL — ABNORMAL LOW (ref 8.7–10.3)
Chloride: 102 mmol/L (ref 96–106)
Creatinine, Ser: 0.76 mg/dL (ref 0.57–1.00)
GFR calc Af Amer: 93 mL/min/{1.73_m2} (ref >59)
GFR calc non Af Amer: 81 mL/min/{1.73_m2} (ref >59)
Glucose: 198 mg/dL — ABNORMAL HIGH (ref 65–99)
Potassium: 4.3 mmol/L (ref 3.5–5.2)
Sodium: 143 mmol/L (ref 134–144)

## 2015-07-09 LAB — LIPID PANEL
Chol/HDL Ratio: 3.7 ratio units (ref 0.0–4.4)
Cholesterol, Total: 151 mg/dL (ref 100–199)
HDL: 41 mg/dL (ref >39)
LDL Calculated: 68 mg/dL (ref 0–99)
Triglycerides: 209 mg/dL — ABNORMAL HIGH (ref 0–149)
VLDL Cholesterol Cal: 42 mg/dL — ABNORMAL HIGH (ref 5–40)

## 2015-07-09 LAB — HEMOGLOBIN A1C
Est. average glucose Bld gHb Est-mCnc: 171 mg/dL
Hgb A1c MFr Bld: 7.6 % — ABNORMAL HIGH (ref 4.8–5.6)

## 2015-07-09 NOTE — Progress Notes (Signed)
Patient ID: Debra [name], female   DOB: 04/04/1945, 70 y.o.   MRN: [id number] Pt returned for her dressing change.  BP improved today. CMA noted improvement in right leg erythema, swelling and ulcerations. Left great toe "hole" remains and was redressed.  Needs wound care center visit for further management due to risk of osteomyelitis developing and limb loss.  She also did get her labwork done and is taking her doxycycline as directed.

## 2015-07-11 ENCOUNTER — Inpatient Hospital Stay: Admission: RE | Admit: 2015-07-11 | Payer: Medicare HMO | Source: Ambulatory Visit

## 2015-07-17 ENCOUNTER — Encounter: Payer: Self-pay | Admitting: Internal Medicine

## 2015-07-17 ENCOUNTER — Telehealth: Payer: Self-pay | Admitting: Internal Medicine

## 2015-07-17 NOTE — Telephone Encounter (Signed)
Mrs. Gasaway is in danger if she does not follow up on her toe wound.  I am very concerned about her.  Caren Griffins, can you try to reach out to her since she is "mad" at [place] for referring her back to the wound care center where they can properly handle her toe wound?

## 2015-07-17 NOTE — Telephone Encounter (Signed)
Called pt from [place] and her "sister" picked up the phone and stated that the patient is mad at [facility] and she then disconnected the call.

## 2015-07-17 NOTE — Telephone Encounter (Signed)
It is quite urgent that she attend so she does not lose her toe, but she is not eager to go.  Thank you for your multiple attempts.  I will see if Karena Addison can get a hold of her from the [place] number.

## 2015-07-17 NOTE — Telephone Encounter (Signed)
Called another Telephone # in patients chart Mickel Baas).  No ans, and not able to leave a message...cdavis

## 2015-07-17 NOTE — Telephone Encounter (Signed)
FYI, Wound Care and I have been trying to reach patient to get her scheduled for an appointment with them. Patient is not returning either of our calls.  (Letter sent to patent today 07/17/15)

## 2015-07-17 NOTE — Telephone Encounter (Signed)
Called Mrs. Lovena Le from [facility] # and my cell #.  No one picked up,continusous ring. No other # in her chart. cdavis

## 2015-07-18 ENCOUNTER — Encounter: Payer: Self-pay | Admitting: Internal Medicine

## 2015-07-18 NOTE — Telephone Encounter (Signed)
Certified letter to pt was not sent.Marland KitchenMarland KitchenMarland KitchenInstead, on Wednesday, the Referrral Rep sent Mrs. Debra [name] a letter to contact her... Cdavis

## 2015-07-18 NOTE — Telephone Encounter (Signed)
Called Debra [name] again today. N/A.... Also the other contact # N/A... Sending a certified letter regarding the referral..Marland KitchenCdavis

## 2015-07-19 ENCOUNTER — Other Ambulatory Visit: Payer: Self-pay | Admitting: Internal Medicine

## 2015-07-25 ENCOUNTER — Encounter: Payer: Self-pay | Admitting: Internal Medicine

## 2015-07-25 NOTE — Telephone Encounter (Signed)
A certified letter was mailed to the patient today. Letter was reviewed by Dr. Mariea Clonts before being sent

## 2015-07-29 ENCOUNTER — Ambulatory Visit: Payer: Commercial Managed Care - HMO | Admitting: Internal Medicine

## 2015-07-30 ENCOUNTER — Other Ambulatory Visit: Payer: Self-pay | Admitting: Internal Medicine

## 2015-07-31 ENCOUNTER — Other Ambulatory Visit: Payer: Self-pay | Admitting: Internal Medicine

## 2015-08-01 NOTE — Telephone Encounter (Signed)
Debra [name], Debra [name] spoke with Mrs. Debra [name] on today regarding a Billing issue. The issue was handled.   We have been trying to contact Mrs. Debra [name], to discuss an issue with her foot/ toe. She needs to follow up with the wound clinic for treatment.  Mrs. Debra [name] continues to decline treatment by the wound clinic and with Dr. Mariea [name].  Stated she will contact Dr. Mariea [name] if there is any changes.  She refused to go back to the wound clinic, stated they have cut on her  toe 3 times. Says he hurt, and she will not go back. Mrs. Debra [name] says her toe looks better, still some redness and blisters, however it feels better.  Dr. Mariea [name] is aware of this conversation. cdavis

## 2015-08-01 NOTE — Telephone Encounter (Signed)
Noted.  I had recommended she be seen, but she refuses until she wants to come. She is aware of the risks of not allowing a professional to examine her toe and leg.

## 2015-08-03 ENCOUNTER — Other Ambulatory Visit: Payer: Self-pay | Admitting: Internal Medicine

## 2015-08-05 ENCOUNTER — Other Ambulatory Visit: Payer: Self-pay | Admitting: *Deleted

## 2015-08-05 MED ORDER — PREGABALIN 75 MG PO CAPS: ORAL_CAPSULE | ORAL | Status: DC

## 2015-08-05 NOTE — Telephone Encounter (Signed)
Patient requested to be sent to pharmacy.  

## 2015-08-26 ENCOUNTER — Other Ambulatory Visit: Payer: Self-pay | Admitting: Internal Medicine

## 2015-09-02 ENCOUNTER — Other Ambulatory Visit: Payer: Self-pay | Admitting: Internal Medicine

## 2015-09-02 DIAGNOSIS — I10 Essential (primary) hypertension: Secondary | ICD-10-CM

## 2015-09-02 MED ORDER — METOPROLOL SUCCINATE ER 50 MG PO TB24: ORAL_TABLET | ORAL | Status: DC

## 2015-09-02 NOTE — Telephone Encounter (Signed)
[facility] 

## 2015-09-08 ENCOUNTER — Other Ambulatory Visit: Payer: Self-pay | Admitting: Internal Medicine

## 2015-09-09 ENCOUNTER — Other Ambulatory Visit: Payer: Self-pay | Admitting: Internal Medicine

## 2015-09-19 ENCOUNTER — Other Ambulatory Visit: Payer: Self-pay | Admitting: Internal Medicine

## 2015-09-26 ENCOUNTER — Other Ambulatory Visit: Payer: Self-pay | Admitting: Internal Medicine

## 2015-10-02 ENCOUNTER — Other Ambulatory Visit: Payer: Self-pay | Admitting: Internal Medicine

## 2015-10-09 ENCOUNTER — Other Ambulatory Visit: Payer: Self-pay | Admitting: Internal Medicine

## 2015-10-29 ENCOUNTER — Other Ambulatory Visit: Payer: Self-pay | Admitting: Internal Medicine

## 2015-11-04 ENCOUNTER — Other Ambulatory Visit: Payer: Self-pay | Admitting: Nurse Practitioner

## 2015-11-10 ENCOUNTER — Other Ambulatory Visit: Payer: Self-pay | Admitting: Internal Medicine

## 2015-11-10 DIAGNOSIS — I10 Essential (primary) hypertension: Secondary | ICD-10-CM

## 2015-11-22 ENCOUNTER — Other Ambulatory Visit: Payer: Self-pay | Admitting: Internal Medicine

## 2015-11-23 DIAGNOSIS — Z0100 Encounter for examination of eyes and vision without abnormal findings: Secondary | ICD-10-CM | POA: Diagnosis not present

## 2015-11-23 DIAGNOSIS — E119 Type 2 diabetes mellitus without complications: Secondary | ICD-10-CM | POA: Diagnosis not present

## 2015-12-03 ENCOUNTER — Other Ambulatory Visit: Payer: Self-pay | Admitting: Internal Medicine

## 2015-12-09 ENCOUNTER — Telehealth: Payer: Self-pay | Admitting: *Deleted

## 2015-12-09 NOTE — Telephone Encounter (Signed)
I have tried to call home number several times and line just keeps being busy, tried calling the cell number in chart and it's invalid. Will cancel the order, per Dr. Mariea Clonts patient may not be coming back in.

## 2015-12-09 NOTE — Telephone Encounter (Signed)
-----   Message from Gayland Curry, DO sent at 12/07/2015 11:51 AM EDT ----- [name] still has not had her mammogram apparently.  Please confirm and contact her.  I don't know if she's even coming back to see [place] ever.

## 2015-12-17 ENCOUNTER — Other Ambulatory Visit: Payer: Self-pay | Admitting: Internal Medicine

## 2015-12-17 DIAGNOSIS — E119 Type 2 diabetes mellitus without complications: Secondary | ICD-10-CM | POA: Diagnosis not present

## 2015-12-17 DIAGNOSIS — H1012 Acute atopic conjunctivitis, left eye: Secondary | ICD-10-CM | POA: Diagnosis not present

## 2016-01-04 ENCOUNTER — Other Ambulatory Visit: Payer: Self-pay | Admitting: Internal Medicine

## 2016-01-10 ENCOUNTER — Other Ambulatory Visit: Payer: Self-pay | Admitting: Internal Medicine

## 2016-01-24 ENCOUNTER — Other Ambulatory Visit: Payer: Self-pay | Admitting: Internal Medicine

## 2016-02-06 ENCOUNTER — Other Ambulatory Visit: Payer: Self-pay | Admitting: Nurse Practitioner

## 2016-02-10 ENCOUNTER — Encounter: Payer: Self-pay | Admitting: Nurse Practitioner

## 2016-02-10 ENCOUNTER — Ambulatory Visit (INDEPENDENT_AMBULATORY_CARE_PROVIDER_SITE_OTHER): Payer: Commercial Managed Care - HMO | Admitting: Nurse Practitioner

## 2016-02-10 VITALS — BP 134/76 | HR 70 | Temp 97.7°F | Resp 18 | Ht 64.0 in | Wt 252.8 lb

## 2016-02-10 DIAGNOSIS — E11621 Type 2 diabetes mellitus with foot ulcer: Secondary | ICD-10-CM

## 2016-02-10 DIAGNOSIS — E1165 Type 2 diabetes mellitus with hyperglycemia: Secondary | ICD-10-CM | POA: Diagnosis not present

## 2016-02-10 DIAGNOSIS — L97529 Non-pressure chronic ulcer of other part of left foot with unspecified severity: Secondary | ICD-10-CM

## 2016-02-10 DIAGNOSIS — E1140 Type 2 diabetes mellitus with diabetic neuropathy, unspecified: Secondary | ICD-10-CM | POA: Diagnosis not present

## 2016-02-10 DIAGNOSIS — IMO0002 Reserved for concepts with insufficient information to code with codable children: Secondary | ICD-10-CM

## 2016-02-10 DIAGNOSIS — M25562 Pain in left knee: Secondary | ICD-10-CM | POA: Diagnosis not present

## 2016-02-10 DIAGNOSIS — M25561 Pain in right knee: Secondary | ICD-10-CM

## 2016-02-10 DIAGNOSIS — G8929 Other chronic pain: Secondary | ICD-10-CM

## 2016-02-10 LAB — COMPLETE METABOLIC PANEL WITH GFR
ALBUMIN: 3.8 g/dL (ref 3.6–5.1)
ALK PHOS: 74 U/L (ref 33–130)
ALT: 18 U/L (ref 6–29)
AST: 20 U/L (ref 10–35)
BUN: 15 mg/dL (ref 7–25)
CALCIUM: 9.2 mg/dL (ref 8.6–10.4)
CHLORIDE: 102 mmol/L (ref 98–110)
CO2: 28 mmol/L (ref 20–31)
Creat: 0.71 mg/dL (ref 0.50–0.99)
GFR, EST NON AFRICAN AMERICAN: 87 mL/min (ref >=60)
Glucose, Bld: 193 mg/dL — ABNORMAL HIGH (ref 65–99)
POTASSIUM: 4.4 mmol/L (ref 3.5–5.3)
Sodium: 140 mmol/L (ref 135–146)
Total Bilirubin: 0.6 mg/dL (ref 0.2–1.2)
Total Protein: 6.5 g/dL (ref 6.1–8.1)

## 2016-02-10 MED ORDER — DICLOFENAC SODIUM 1 % TD GEL: 4.0000 g | Freq: Four times a day (QID) | TRANSDERMAL | 3 refills | Status: DC

## 2016-02-10 NOTE — Progress Notes (Signed)
Careteam: Patient Care Team: Gayland Curry, DO as PCP - General (Geriatric Medicine)  Advanced Directive information Does patient have an advance directive?: No  Allergies  Allergen Reactions  . Aspirin Nausea Only    And causes bruises  . Banana Hives  . Hydrocodone Hives  . Neomycin-Polymyxin-Gramicidin Itching and Swelling    Eye drops caused swelling in face and rash and itching on arms and neck  . Penicillins Hives and Itching  . Tramadol Nausea And Vomiting  . Latex Hives and Rash    Chief Complaint  Patient presents with  . Acute Visit    bilateral knee pain/pressure with constant ache. Would like a referral to [facility]     HPI: [name] seen in the office today with complaints of pain in both knees. Reports the pain is terrible all the time with no relief at any time of day. She reports she is unable to bend her knees. The pain has been going on for several months and is worsening. It began back in May 2017 when she hit her left knee on something, she doesn't recall what it was. She rates the pain a 9/10 and describes it as a pinching all the time, and then it feels like needles are stabbing the knees when she walks. Has not found anything that makes the pain better. Bending the knee and walking make the pain worse. She has a cane, that she doesn't feel helps. She must ride on a scooter at [place] or she states she will fall. She is very distressed regarding her inability to move. She has increased pain in the shower. Aleve did help some, but patient knows she can only take them twice a day and they started making her stomach hurt. The patient has not received any physical therapy or seen any other physicians for the current problem. She has an appt with flex[facility] tomorrow but thinking about cancelling this.  Diabetes is not controlled, likes her sweet. Has gone to a nutritionist and knows what she is supposed to be eating but has not made  these changes to her diet.    Review of Systems:  Review of Systems  Constitutional: Positive for activity change (due to knee pain). Negative for chills and fever.  Musculoskeletal: Positive for arthralgias, gait problem (drags left leg due to pain), joint swelling (bilateral knees) and myalgias. Negative for back pain.       Bilaterally knee pain.  Skin: Positive for wound (diabetic foot ulcer to bottom of left great toe ). Negative for color change.  Neurological: Positive for numbness (onset prior to knee pain).    Past Medical History:  Diagnosis Date  . Cellulitis of left leg    history of, most recent episode 01/09  . Chronic venous insufficiency   . 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 Diverticulitis    h/o 2-3 episodes in past  . Hyperlipidemia LDL goal < 100   . Hypertension goal BP (blood pressure) < 130/80   . Leg edema    secondary to chronic venous insufficiency  . Morbid obesity ([place])   . Osteopenia    DEXA scan 7/07   Past Surgical History:  Procedure Laterality Date  . ABDOMINAL HYSTERECTOMY    . APPENDECTOMY    . CHOLECYSTECTOMY    . CHOLECYSTECTOMY N/A 04/14/2013   Procedure: LAPAROSCOPIC CHOLECYSTECTOMY WITH INTRAOPERATIVE CHOLANGIOGRAM;  Surgeon: Adin Hector, MD;  Location: [facility];  Service:  General;  Laterality: N/A;  . COLECTOMY     with diverting colsotmy and revision in the 1990's for diverticulitis  . ERCP N/A 04/17/2013   Procedure: ENDOSCOPIC RETROGRADE CHOLANGIOPANCREATOGRAPHY (ERCP);  Surgeon: Irene Shipper, MD;  Location: [facility];  Service: Endoscopy;  Laterality: N/A;  note pt want general anesthesia for this case.  preferto perform in endo unit, but if unavailable please arrange time in OR  . EYE SURGERY  8/13   left cataract removal & retinal repair  . LAPAROSCOPIC LYSIS OF ADHESIONS N/A 04/14/2013   Procedure: LAPAROSCOPIC LYSIS OF ADHESIONS;  Surgeon: Adin Hector, MD;  Location: [facility];  Service: General;   Laterality: N/A;   Social History:   reports that she has never smoked. She has never used smokeless tobacco. She reports that she does not drink alcohol or use drugs.  Family History  Problem Relation Age of Onset  . Heart disease Mother   . Heart disease Father   . Heart disease Brother   . Cancer Brother   . Cancer Brother   . Hypertension Sister   . Heart disease Sister     Medications: Patient's Medications  New Prescriptions   DICLOFENAC SODIUM (VOLTAREN) 1 % GEL    Apply 4 g topically 4 (four) times daily.  Previous Medications   ATORVASTATIN (LIPITOR) 40 MG TABLET    TAKE 1 TABLET BY MOUTH EVERY DAY   FUROSEMIDE (LASIX) 20 MG TABLET    TAKE 1 TABLET(20 MG) BY MOUTH DAILY FOR SWELLING   GLUCOSE BLOOD (ACCU-CHEK AVIVA PLUS) TEST STRIP    Ell.65 check blood sugar twice daily as directed   LISINOPRIL (PRINIVIL,ZESTRIL) 40 MG TABLET    TAKE 1 TABLET BY MOUTH DAILY   LYRICA 75 MG CAPSULE    TAKE 1 CAPSULE BY MOUTH THREE TIMES DAILY AS NEEDED   METFORMIN (GLUCOPHAGE) 1000 MG TABLET    TAKE 1 TABLET BY MOUTH TWICE DAILY WITH MEALS   METOPROLOL SUCCINATE (TOPROL-XL) 50 MG 24 HR TABLET    TAKE 1 TABLET BY MOUTH ONCE DAILY   OMEPRAZOLE (PRILOSEC) 20 MG CAPSULE    TAKE 1 CAPSULE BY MOUTH DAILY   POTASSIUM CHLORIDE SA (K-DUR,KLOR-CON) 20 MEQ TABLET    TAKE 1 TABLET(20 MEQ) BY MOUTH DAILY   TRADJENTA 5 MG TABS TABLET    TAKE 1 TABLET BY MOUTH EVERY DAY   TRIAMCINOLONE CREAM (KENALOG) 0.1 %    APPLY TOPICALLY THREE TIMES DAILY  Modified Medications   No medications on file  Discontinued Medications   DOXYCYCLINE (VIBRA-TABS) 100 MG TABLET    Take 1 tablet (100 mg total) by mouth 2 (two) times daily.   OMEPRAZOLE (PRILOSEC) 20 MG CAPSULE    TAKE 1 CAPSULE BY MOUTH DAILY     Physical Exam:  Vitals:   02/10/16 1426  BP: 134/76  Pulse: 70  Resp: 18  Temp: 97.7 F (36.5 C)  TempSrc: Oral  SpO2: 96%  Weight: 252 lb 12.8 oz (114.7 kg)  Height: 5\' 4"  (1.626 m)   Body mass index  is 43.39 kg/m.  Physical Exam  Constitutional: She is oriented to person, place, and time. She appears well-developed and well-nourished.  HENT:  Head: Normocephalic.  Neck: Normal range of motion. Neck supple.  Cardiovascular: Normal rate and regular rhythm.   Pulmonary/Chest: Effort normal and breath sounds normal.  Abdominal: Soft. Bowel sounds are normal.  Musculoskeletal:       Right knee: She exhibits decreased range of motion. Tenderness  found.       Left knee: She exhibits decreased range of motion. Tenderness found.  Limited ROM to knees. Not able to bend bilateral knees greater than ~70 degrees.  Strength equal bilaterally to legs  Neurological: She is alert and oriented to person, place, and time. She displays normal reflexes. A sensory deficit (to bilateral feet, severe neuropathy due to diabetes) is present. No cranial nerve deficit. She exhibits normal muscle tone. Coordination normal.  Reflex Scores:      Patellar reflexes are 1+ on the right side and 1+ on the left side. Negative foot drop   Skin: Skin is warm and dry.  Psychiatric: She has a normal mood and affect. Her behavior is normal. Judgment and thought content normal.    Labs reviewed: Basic Metabolic Panel:  Recent Labs  07/08/15 1023  NA 143  K 4.3  CL 102  CO2 23  GLUCOSE 198*  BUN 21  CREATININE 0.76  CALCIUM 8.6*   Liver Function Tests: No results for input(s): AST, ALT, ALKPHOS, BILITOT, PROT, ALBUMIN in the last 8760 hours. No results for input(s): LIPASE, AMYLASE in the last 8760 hours. No results for input(s): AMMONIA in the last 8760 hours. CBC:  Recent Labs  07/08/15 1023  WBC 6.2  NEUTROABS 3.0  HCT 41.6  MCV 91  PLT 268   Lipid Panel:  Recent Labs  07/08/15 1023  CHOL 151  HDL 41  LDLCALC 68  TRIG 209*  CHOLHDL 3.7   TSH: No results for input(s): TSH in the last 8760 hours. A1C: Lab Results  Component Value Date   HGBA1C 7.6 (H) 07/08/2015      Assessment/Plan 1. Chronic pain of both knees -acute on chronic pain. -tylenol not effective, to stop use due to taking too much, educated she should only use 3000 mg daily  -may use aleve 1 tablet BID with food - diclofenac sodium (VOLTAREN) 1 % GEL; Apply 4 g topically 4 (four) times daily.  Dispense: 100 g; Refill: 3 - DG Knee Complete 4 Views Left; Future - DG Knee Complete 4 Views Right; Future -to use Ice as needed  -discussed need for weight loss  2. Diabetic ulcer of toe of left foot associated with type 2 diabetes mellitus, unspecified ulcer stage (HCC) No signs of infection, unchanged.  3. Type 2 diabetes, uncontrolled, with neuropathy ([place]) -discussed need for better eating and diet compliance.  Has not had recent A1c, will follow up today - COMPLETE METABOLIC PANEL WITH GFR - Hemoglobin A1c  To follow up with Dr Mariea Clonts in 2 weeks and bring blood sugar logs  Lashan Macias K. Harle Battiest  [facility] [phone] 8 am - 5 pm) [phone] (after hours)

## 2016-02-10 NOTE — Patient Instructions (Addendum)
Cut out sweets. Decrease carbohydrates. Follow recommendations by dietician.  Bring a log of your blood sugars with you to your next appointment.  Weight loss needed  Bilateral knee x-rays Ice to knees. To use voltaren gel every 6 hours as needed for pain  Can use Aleve 1 tablet twice a day as needed with food.     Knee Pain Knee pain is a very common symptom and can have many causes. Knee pain often goes away when you follow your health care provider's instructions for relieving pain and discomfort at home. However, knee pain can develop into a condition that needs treatment. Some conditions may include:  Arthritis caused by wear and tear (osteoarthritis).  Arthritis caused by swelling and irritation (rheumatoid arthritis or gout).  A cyst or growth in your knee.  An infection in your knee joint.  An injury that will not heal.  Damage, swelling, or irritation of the tissues that support your knee (torn ligaments or tendinitis). If your knee pain continues, additional tests may be ordered to diagnose your condition. Tests may include X-rays or other imaging studies of your knee. You may also need to have fluid removed from your knee. Treatment for ongoing knee pain depends on the cause, but treatment may include:  Medicines to relieve pain or swelling.  Steroid injections in your knee.  Physical therapy.  Surgery. HOME CARE INSTRUCTIONS  Take medicines only as directed by your health care provider.  Rest your knee and keep it raised (elevated) while you are resting.  Do not do things that cause or worsen pain.  Avoid high-impact activities or exercises, such as running, jumping rope, or doing jumping jacks.  Apply ice to the knee area:  Put ice in a plastic bag.  Place a towel between your skin and the bag.  Leave the ice on for 20 minutes, 2-3 times a day.  Ask your health care provider if you should wear an elastic knee support.  Keep a pillow under your knee  when you sleep.  Lose weight if you are overweight. Extra weight can put pressure on your knee.  Do not use any tobacco products, including cigarettes, chewing tobacco, or electronic cigarettes. If you need help quitting, ask your health care provider. Smoking may slow the healing of any bone and joint problems that you may have. SEEK MEDICAL CARE IF:  Your knee pain continues, changes, or gets worse.  You have a fever along with knee pain.  Your knee buckles or locks up.  Your knee becomes more swollen. SEEK IMMEDIATE MEDICAL CARE IF:   Your knee joint feels hot to the touch.  You have chest pain or trouble breathing. This information is not intended to replace advice given to you by your health care provider. Make sure you discuss any questions you have with your health care provider. Document Released: 01/04/2007 Document Revised: 03/30/2014 Document Reviewed: 10/23/2013 Elsevier Interactive Patient Education  2017 [employer].

## 2016-02-11 ENCOUNTER — Ambulatory Visit
Admission: RE | Admit: 2016-02-11 | Discharge: 2016-02-11 | Disposition: A | Discharge status: Home / Self Care | Payer: Medicare HMO | Source: Ambulatory Visit | Attending: Nurse Practitioner | Admitting: Nurse Practitioner

## 2016-02-11 DIAGNOSIS — G8929 Other chronic pain: Secondary | ICD-10-CM

## 2016-02-11 DIAGNOSIS — M25561 Pain in right knee: Principal | ICD-10-CM

## 2016-02-11 DIAGNOSIS — M179 Osteoarthritis of knee, unspecified: Secondary | ICD-10-CM | POA: Diagnosis not present

## 2016-02-11 DIAGNOSIS — M25562 Pain in left knee: Principal | ICD-10-CM

## 2016-02-11 LAB — HEMOGLOBIN A1C
HEMOGLOBIN A1C: 7.4 % — AB (ref <5.7)
MEAN PLASMA GLUCOSE: 166 mg/dL

## 2016-02-15 ENCOUNTER — Emergency Department (HOSPITAL_COMMUNITY): Payer: Medicare HMO

## 2016-02-15 ENCOUNTER — Encounter (HOSPITAL_COMMUNITY): Payer: Self-pay | Admitting: Nurse Practitioner

## 2016-02-15 ENCOUNTER — Emergency Department (HOSPITAL_COMMUNITY)
Admission: EM | Admit: 2016-02-15 | Discharge: 2016-02-15 | Disposition: A | Discharge status: Home / Self Care | Payer: Medicare HMO | Attending: Emergency Medicine | Admitting: Emergency Medicine

## 2016-02-15 DIAGNOSIS — E1140 Type 2 diabetes mellitus with diabetic neuropathy, unspecified: Secondary | ICD-10-CM | POA: Diagnosis not present

## 2016-02-15 DIAGNOSIS — Z91040 Latex allergy status: Secondary | ICD-10-CM | POA: Diagnosis not present

## 2016-02-15 DIAGNOSIS — I10 Essential (primary) hypertension: Secondary | ICD-10-CM | POA: Diagnosis not present

## 2016-02-15 DIAGNOSIS — R945 Abnormal results of liver function studies: Secondary | ICD-10-CM | POA: Diagnosis not present

## 2016-02-15 DIAGNOSIS — K439 Ventral hernia without obstruction or gangrene: Secondary | ICD-10-CM | POA: Diagnosis not present

## 2016-02-15 DIAGNOSIS — Z7984 Long term (current) use of oral hypoglycemic drugs: Secondary | ICD-10-CM | POA: Insufficient documentation

## 2016-02-15 DIAGNOSIS — R7989 Other specified abnormal findings of blood chemistry: Secondary | ICD-10-CM

## 2016-02-15 DIAGNOSIS — R1010 Upper abdominal pain, unspecified: Secondary | ICD-10-CM | POA: Diagnosis not present

## 2016-02-15 LAB — CBC
HEMATOCRIT: 42.2 % (ref 36.0–46.0)
HEMOGLOBIN: 14 g/dL (ref 12.0–15.0)
MCH: 30.3 pg (ref 26.0–34.0)
MCHC: 33.2 g/dL (ref 30.0–36.0)
MCV: 91.3 fL (ref 78.0–100.0)
Platelets: 211 10*3/uL (ref 150–400)
RBC: 4.62 MIL/uL (ref 3.87–5.11)
RDW: 14 % (ref 11.5–15.5)
WBC: 3.8 10*3/uL — ABNORMAL LOW (ref 4.0–10.5)

## 2016-02-15 LAB — COMPREHENSIVE METABOLIC PANEL WITH GFR
ALT: 177 U/L — ABNORMAL HIGH (ref 14–54)
AST: 411 U/L — ABNORMAL HIGH (ref 15–41)
Albumin: 4 g/dL (ref 3.5–5.0)
Alkaline Phosphatase: 177 U/L — ABNORMAL HIGH (ref 38–126)
Anion gap: 11 (ref 5–15)
BUN: 24 mg/dL — ABNORMAL HIGH (ref 6–20)
CO2: 26 mmol/L (ref 22–32)
Calcium: 9 mg/dL (ref 8.9–10.3)
Chloride: 103 mmol/L (ref 101–111)
Creatinine, Ser: 0.81 mg/dL (ref 0.44–1.00)
GFR calc Af Amer: >60 mL/min
GFR calc non Af Amer: >60 mL/min
Glucose, Bld: 207 mg/dL — ABNORMAL HIGH (ref 65–99)
Potassium: 3.8 mmol/L (ref 3.5–5.1)
Sodium: 140 mmol/L (ref 135–145)
Total Bilirubin: 2.4 mg/dL — ABNORMAL HIGH (ref 0.3–1.2)
Total Protein: 7.1 g/dL (ref 6.5–8.1)

## 2016-02-15 LAB — URINALYSIS, ROUTINE W REFLEX MICROSCOPIC
Glucose, UA: NEGATIVE mg/dL
HGB URINE DIPSTICK: NEGATIVE
Ketones, ur: NEGATIVE mg/dL
Nitrite: NEGATIVE
PH: 6 (ref 5.0–8.0)
Protein, ur: NEGATIVE mg/dL
SPECIFIC GRAVITY, URINE: 1.03 (ref 1.005–1.030)

## 2016-02-15 LAB — URINE MICROSCOPIC-ADD ON
Bacteria, UA: NONE SEEN
RBC / HPF: NONE SEEN RBC/hpf (ref 0–5)

## 2016-02-15 LAB — LIPASE, BLOOD: Lipase: 33 U/L (ref 11–51)

## 2016-02-15 MED ORDER — IOPAMIDOL (ISOVUE-300) INJECTION 61%
INTRAVENOUS | Status: AC
  Filled 2016-02-15: qty 100

## 2016-02-15 MED ORDER — SODIUM CHLORIDE 0.9 % IJ SOLN
INTRAMUSCULAR | Status: AC
  Filled 2016-02-15: qty 50

## 2016-02-15 MED ORDER — IOPAMIDOL (ISOVUE-300) INJECTION 61%
100.0000 mL | Freq: Once | INTRAVENOUS | Status: AC | PRN
  Administered 2016-02-15: 100 mL via INTRAVENOUS

## 2016-02-15 NOTE — ED Provider Notes (Signed)
[facility] DEPT Provider Note   CSN: [id number] Arrival date & time: 02/15/16  1405     History   Chief Complaint Chief Complaint  Patient presents with  . Abdominal Pain  . Nausea  . Back Pain  . Dysuria    HPI Debra [name] is a 70 y.o. female.  HPI  70 year old female presents with acute upper abdominal pain that started around 10 AM. She states she had eaten breakfast about one hour prior. Started her upper abdomen and radiated around both sides to the back. Pain was severe for several hours. She tried Pepto-Bismol but does seem to make it worse. She vomited at about 2 PM today and her pain has significantly improved since. No hematemesis. Pain is now a 1 out of 10, earlier it was a 10 out of 10. No current nausea. No diarrhea. She has had her gallbladder removed in the past. Back pain currently gone.  Past Medical History:  Diagnosis Date  . Cellulitis of left leg    history of, most recent episode 01/09  . Chronic venous insufficiency   . 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)   . Diverticulitis    h/o 2-3 episodes in past  . Hyperlipidemia LDL goal < 100   . Hypertension goal BP (blood pressure) < 130/80   . Leg edema    secondary to chronic venous insufficiency  . Morbid obesity ([place])   . Osteopenia    DEXA scan 7/07    Patient Active Problem List   Diagnosis Date Noted  . Abdominal pain, left lower quadrant 07/04/2015  . Type 2 diabetes mellitus with diabetic polyneuropathy ([place]) 10/06/2013  . Hyperlipidemia associated with type 2 diabetes mellitus ([place]) 10/06/2013  . Fungal dermatitis 08/11/2013  . Hyperlipidemia LDL goal < 100 06/09/2013  . Choledocholithiasis with acute cholecystitis 04/14/2013  . Symptomatic cholelithiasis 04/13/2013  . Neuropathic pain of both legs 04/04/2013  . Severe obesity (BMI >= 40) ([place]) 11/28/2012  . Obesity (BMI 30-39.9) 11/28/2012  . 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  Hypertension goal BP (blood pressure) < 130/80   . Osteopenia   . Chronic venous insufficiency   . Leg edema   . Hypertensive retinopathy, grade 2 03/30/2012  . Medial epicondylitis 02/11/2012  . Superficial thrombophlebitis of left leg 07/15/2011  . GERD (gastroesophageal reflux disease) 02/25/2011  . Right knee pain 02/16/2011  . Preventive measure 09/11/2010  . Neck pain 07/03/2010  . OSTEOPENIA 04/12/2006  . Type 2 diabetes, uncontrolled, with neuropathy ([place]) 03/08/2006  . MORBID OBESITY 03/08/2006  . HYPERTENSION 03/08/2006    Past Surgical History:  Procedure Laterality Date  . ABDOMINAL HYSTERECTOMY    . APPENDECTOMY    . CHOLECYSTECTOMY    . CHOLECYSTECTOMY N/A 04/14/2013   Procedure: LAPAROSCOPIC CHOLECYSTECTOMY WITH INTRAOPERATIVE CHOLANGIOGRAM;  Surgeon: Adin Hector, MD;  Location: [facility];  Service: General;  Laterality: N/A;  . COLECTOMY     with diverting colsotmy and revision in the 1990's for diverticulitis  . ERCP N/A 04/17/2013   Procedure: ENDOSCOPIC RETROGRADE CHOLANGIOPANCREATOGRAPHY (ERCP);  Surgeon: Irene Shipper, MD;  Location: [facility];  Service: Endoscopy;  Laterality: N/A;  note pt want general anesthesia for this case.  preferto perform in endo unit, but if unavailable please arrange time in OR  . EYE SURGERY  8/13   left cataract removal & retinal repair  . LAPAROSCOPIC LYSIS OF ADHESIONS N/A 04/14/2013   Procedure: LAPAROSCOPIC LYSIS OF ADHESIONS;  Surgeon: Edsel Petrin  Dalbert Batman, MD;  Location: [facility];  Service: General;  Laterality: N/A;    OB History    No data available       Home Medications    Prior to Admission medications   Medication Sig Start Date End Date Taking? Authorizing Provider  atorvastatin (LIPITOR) 40 MG tablet TAKE 1 TABLET BY MOUTH EVERY DAY 09/26/15   Tiffany L Reed, DO  diclofenac sodium (VOLTAREN) 1 % GEL Apply 4 g topically 4 (four) times daily. 02/10/16   Lauree Chandler, NP  furosemide (LASIX) 20 MG tablet TAKE 1  TABLET(20 MG) BY MOUTH DAILY FOR SWELLING 12/03/15   Tiffany L Reed, DO  glucose blood (ACCU-CHEK AVIVA PLUS) test strip Ell.65 check blood sugar twice daily as directed 01/24/16   Tiffany L Reed, DO  lisinopril (PRINIVIL,ZESTRIL) 40 MG tablet TAKE 1 TABLET BY MOUTH DAILY 04/30/14   Tiffany L Reed, DO  LYRICA 75 MG capsule TAKE 1 CAPSULE BY MOUTH THREE TIMES DAILY AS NEEDED 02/06/16   Tiffany L Reed, DO  metFORMIN (GLUCOPHAGE) 1000 MG tablet TAKE 1 TABLET BY MOUTH TWICE DAILY WITH MEALS 09/09/15   Tiffany L Reed, DO  metoprolol succinate (TOPROL-XL) 50 MG 24 hr tablet TAKE 1 TABLET BY MOUTH ONCE DAILY 11/11/15   Tiffany L Reed, DO  omeprazole (PRILOSEC) 20 MG capsule TAKE 1 CAPSULE BY MOUTH DAILY 12/17/15   Tiffany L Reed, DO  potassium chloride SA (K-DUR,KLOR-CON) 20 MEQ tablet TAKE 1 TABLET(20 MEQ) BY MOUTH DAILY 01/10/16   Tiffany L Reed, DO  TRADJENTA 5 MG TABS tablet TAKE 1 TABLET BY MOUTH EVERY DAY 11/22/15   Tiffany L Reed, DO  triamcinolone cream (KENALOG) 0.1 % APPLY TOPICALLY THREE TIMES DAILY 07/31/14   Gayland Curry, DO    Family History Family History  Problem Relation Age of Onset  . Heart disease Mother   . Heart disease Father   . Heart disease Brother   . Cancer Brother   . Cancer Brother   . Hypertension Sister   . Heart disease Sister     Social History Social History  Substance Use Topics  . Smoking status: Never Smoker  . Smokeless tobacco: Never Used  . Alcohol use No     Allergies   Aspirin; Banana; Hydrocodone; Neomycin-polymyxin-gramicidin; Penicillins; Tramadol; and Latex   Review of Systems Review of Systems  Constitutional: Negative for fever.  Respiratory: Negative for shortness of breath.   Cardiovascular: Negative for chest pain.  Gastrointestinal: Positive for abdominal pain, nausea and vomiting. Negative for diarrhea.  Musculoskeletal: Positive for back pain.  All other systems reviewed and are negative.    Physical Exam Updated Vital Signs BP  128/62 (BP Location: Left Arm)   Pulse 114   Temp 99 F (37.2 C) (Oral)   Resp 18   LMP 07/02/1973   SpO2 93%   Physical Exam  Constitutional: She is oriented to person, place, and time. She appears well-developed and well-nourished. No distress.  Morbidly obese  HENT:  Head: Normocephalic and atraumatic.  Right Ear: External ear normal.  Left Ear: External ear normal.  Nose: Nose normal.  Eyes: Right eye exhibits no discharge. Left eye exhibits no discharge.  Cardiovascular: Normal rate, regular rhythm and normal heart sounds.   Pulmonary/Chest: Effort normal and breath sounds normal.  Abdominal: Soft. There is tenderness in the epigastric area and left lower quadrant.  Neurological: She is alert and oriented to person, place, and time.  Skin: Skin is warm and dry.  She is not diaphoretic.  Nursing note and vitals reviewed.    ED Treatments / Results  Labs (all labs ordered are listed, but only abnormal results are displayed) Labs Reviewed  COMPREHENSIVE METABOLIC PANEL - Abnormal; Notable for the following:       Result Value   Glucose, Bld 207 (*)    BUN 24 (*)    AST 411 (*)    ALT 177 (*)    Alkaline Phosphatase 177 (*)    Total Bilirubin 2.4 (*)    All other components within normal limits  CBC - Abnormal; Notable for the following:    WBC 3.8 (*)    All other components within normal limits  URINALYSIS, ROUTINE W REFLEX MICROSCOPIC (NOT AT [facility]) - Abnormal; Notable for the following:    Color, Urine ORANGE (*)    Bilirubin Urine MODERATE (*)    Leukocytes, UA SMALL (*)    All other components within normal limits  URINE MICROSCOPIC-ADD ON - Abnormal; Notable for the following:    Squamous Epithelial / LPF 0-5 (*)    All other components within normal limits  LIPASE, BLOOD    EKG  EKG Interpretation None       Radiology Ct Abdomen Pelvis W Contrast  Result Date: 02/15/2016 CLINICAL DATA:  Abdominal pain, back pain, nausea, burning urination  starting this morning EXAM: CT ABDOMEN AND PELVIS WITH CONTRAST TECHNIQUE: Multidetector CT imaging of the abdomen and pelvis was performed using the standard protocol following bolus administration of intravenous contrast. CONTRAST:  170mL ISOVUE-300 IOPAMIDOL (ISOVUE-300) INJECTION 61% COMPARISON:  CT scan 03/11/2008 FINDINGS: Lower chest: No acute findings lung bases.  Small hiatal hernia. Hepatobiliary: The patient is status post cholecystectomy. Small pneumobilia noted in left hepatic lobe. No focal hepatic mass. Pancreas: There is atrophic fatty replaced pancreas. No dilatation of main pancreatic duct. No evidence of acute pancreatitis. Spleen: Enhanced spleen is unremarkable. Adrenals/Urinary Tract: No right adrenal mass. There is a low-density nodule left adrenal gland measures 1.8 cm probable adenoma. Kidneys are symmetrical in size and enhancement. No hydronephrosis or hydroureter. No evidence of nephrolithiasis. The urinary bladder is unremarkable. Stomach/Bowel: There is no small bowel obstruction. Again noted large ventral hernia midline anterior abdominal wall at the level of umbilicus extending in lower pelvic wall. The hernia measures at least 15.5 cm cranial caudally. Measures about 15 cm transverse diameter and about 8 cm AP diameter. The hernia is containing omental fat small bowel loops and segment of transverse colon. There is no evidence of acute complication small bowel or colonic obstruction. No pericecal inflammation. The patient is status post appendectomy. No distal colonic obstruction. Some stool noted within distal sigmoid colon and rectum. Few diverticula are noted sigmoid colon without evidence of acute diverticulitis. Vascular/Lymphatic: No retroperitoneal adenopathy. Atherosclerotic calcifications of abdominal aorta and iliac arteries. No aortic aneurysm. Reproductive: The patient is status post hysterectomy. Again noted a cystic lesion in left posterior or pelvic with anterior  calcifications. The lesion measures 3.7 by 2.9 cm. On the prior exam measures 3.4 x 2.2 cm. Other: No ascites or free abdominal air. Musculoskeletal: No destructive bony lesions are noted. Sagittal images of the spine shows mild degenerative changes lumbar spine. IMPRESSION: 1. No hydronephrosis or hydroureter.  Unremarkable urinary bladder. 2. Status postcholecystectomy. Small pneumobilia is noted in left hepatic lobe. 3. There is atrophic partially fatty replaced pancreas. 4. Again noted large ventral hernia midline lower abdominal wall and anterior pelvic wall. The hernia measures 15.5 x 15 x  8 cm. Is containing omental fat small bowel loops and segment of the colon. There is no evidence of acute complication small bowel or colonic obstruction. 5. Few diverticula are noted distal colon. No evidence of acute diverticulitis or colitis. No colonic obstruction. 6. There is a low-density nodule within left adrenal gland measures 1.8 cm probable adenoma. 7. Progression in size of cystic lesion in left posterior pelvis measures 3.7 x 2.9 cm. On the prior exam measures 3.4 x 2.2 cm. Again noted calcifications the patient is status post hysterectomy. Anterior aspect of the lesion. Electronically Signed   By: Lahoma Crocker M.D.   On: 02/15/2016 18:26    Procedures Procedures (including critical care time)  Medications Ordered in ED Medications  iopamidol (ISOVUE-300) 61 % injection 100 mL (100 mLs Intravenous Contrast Given 02/15/16 1744)     Initial Impression / Assessment and Plan / ED Course  I have reviewed the triage vital signs and the nursing notes.  Pertinent labs & imaging results that were available during my care of the patient were reviewed by me and considered in my medical decision making (see chart for details).  Clinical Course as of Feb 14 2350  Sat Feb 15, 2016  1911 D/w [place], probably intra-hepatic stone. Given her LFTs he recommends observation in the hospital overnight with repeat  LFTs in the morning and likely MR CP. If patient does not want to stay, follow-up in 2 days and he will arrange for lab work in 2 days for her LFTs.  [SG]  1919 Patient adamant about going home. Strict return precautions, labs in 2 days (Monday). Discussed possible worsening of liver function tests and that this can ultimately cause liver failure. She understands this.  [SG]    Clinical Course User Index [SG] Sherwood Gambler, MD    Probably a passed stone. Minimal pain now. Strict return precautions.  Final Clinical Impressions(s) / ED Diagnoses   Final diagnoses:  Upper abdominal pain  Abnormal LFTs (liver function tests)    New Prescriptions Discharge Medication List as of 02/15/2016  7:23 PM       Sherwood Gambler, MD 02/15/16 2351

## 2016-02-15 NOTE — Discharge Instructions (Signed)
Your liver function tests are abnormal. It is very important that if your abdominal pain returns or he develop vomiting, fevers, or worsening symptoms, come back to the ER immediately. Otherwise follow-up with the gastroenterologist and/or your primary care doctor on Monday 11/27

## 2016-02-15 NOTE — ED Triage Notes (Signed)
Patient presents to [facility] for complaints of sudden abdominal pain, back pain, nausea, emesis x1, and burning urination that started this morning. She tried to take a dose of pepto-bismol but then vomited.

## 2016-02-17 ENCOUNTER — Other Ambulatory Visit: Payer: Self-pay | Admitting: Internal Medicine

## 2016-02-17 ENCOUNTER — Other Ambulatory Visit: Payer: Commercial Managed Care - HMO

## 2016-02-17 ENCOUNTER — Other Ambulatory Visit: Payer: Self-pay

## 2016-02-17 ENCOUNTER — Telehealth: Payer: Self-pay

## 2016-02-17 DIAGNOSIS — R7989 Other specified abnormal findings of blood chemistry: Secondary | ICD-10-CM

## 2016-02-17 DIAGNOSIS — K7689 Other specified diseases of liver: Secondary | ICD-10-CM | POA: Diagnosis not present

## 2016-02-17 DIAGNOSIS — R945 Abnormal results of liver function studies: Secondary | ICD-10-CM

## 2016-02-17 LAB — HEPATIC FUNCTION PANEL
ALT: 279 U/L — ABNORMAL HIGH (ref 6–29)
AST: 175 U/L — ABNORMAL HIGH (ref 10–35)
Albumin: 3.6 g/dL (ref 3.6–5.1)
Alkaline Phosphatase: 184 U/L — ABNORMAL HIGH (ref 33–130)
Bilirubin, Direct: 2.3 mg/dL — ABNORMAL HIGH (ref <=0.2)
Indirect Bilirubin: 1.2 mg/dL (ref 0.2–1.2)
Total Bilirubin: 3.5 mg/dL — ABNORMAL HIGH (ref 0.2–1.2)
Total Protein: 6.4 g/dL (ref 6.1–8.1)

## 2016-02-17 NOTE — Telephone Encounter (Signed)
Spoke to patient re: getting repeat LFT's. She states she had it drawn at her PCP office this morning.

## 2016-02-18 ENCOUNTER — Telehealth: Payer: Self-pay | Admitting: Internal Medicine

## 2016-02-18 ENCOUNTER — Telehealth: Payer: Self-pay

## 2016-02-18 ENCOUNTER — Telehealth: Payer: Self-pay | Admitting: *Deleted

## 2016-02-18 DIAGNOSIS — R945 Abnormal results of liver function studies: Secondary | ICD-10-CM

## 2016-02-18 LAB — LIPASE: Lipase: 39 U/L (ref 7–60)

## 2016-02-18 LAB — AMYLASE: Amylase: 19 U/L (ref 0–105)

## 2016-02-18 NOTE — Telephone Encounter (Signed)
Spoke to patient, a referral was made to see Dr. Henrene Pastor. I told patient that I would send Dr. Henrene Pastor a message to look at the labs and would get back to her.

## 2016-02-18 NOTE — Telephone Encounter (Signed)
Spoke to patient and she is scheduled to see Ellouise Newer, PA on Thursday, 11/30 at 10:15. Patient understands that if she becomes symptomatic that she needs to go back to the hospital. Patient currently denies any abdominal pain, and appetite has returned.

## 2016-02-18 NOTE — Telephone Encounter (Signed)
-----   Message from Gayland Curry, DO sent at 02/18/2016 10:56 AM EST ----- I want her not to take her metformin today and not to take any tylenol b/c her liver numbers remain very high.  Some are better, some are worse than 11/25.  Is it possible to add on amylase, lipase and ammonia levels?  Oddly, the imaging of her abdomen from the ED and a past ERCP when she had her gallbladder removed do not mention the appearance of her liver.  She has elevated direct bilirubin and I had noticed she had scleral icterus when I saw her in the hall yesterday.  She had no elevation of her WBC count on 11/25.  It would be ideal if she can be seen at GI ASAP--Dr. Henrene Pastor did her ERCP when she had her chole.

## 2016-02-18 NOTE — Telephone Encounter (Signed)
Patient called and stated that the tape the lab used on her arm caused some redness and itching. Instructed her to keep clean and dry and use some Hydrocortisone Cream and to call if it get any more red or any new symptoms occur. No SOB. She agrees.

## 2016-02-18 NOTE — Telephone Encounter (Signed)
Patient notified and agreed. Referral placed for ASAP GI-Dr. Henrene Pastor. Spoke with Olivia Mackie in the lab and Amylase and Lipase can be added but the Ammonia cannot due to it having to be frozen. Added labs to clipboard.

## 2016-02-18 NOTE — Telephone Encounter (Signed)
-----   Message from Irene Shipper, MD sent at 02/18/2016  3:08 PM EST ----- Regarding: RE: Abnormal LFTs, passed stone? This patient needs to be seen in the office this week. She may have recurrent choledocholithiasis or some other reason for liver test abnormalities. She may need MRCP. Make sure she gets seen. I cannot manage her over the telephone as I'm in the hospital this week. Dr. Henrene Pastor ----- Message ----- From: Doristine Counter, RN Sent: 02/18/2016   2:30 PM To: Irene Shipper, MD Subject: FW: Abnormal LFTs, passed stone?               Please see her lab results. Patient had already had her labs drawn at her PCP's office yesterday morning. ----- Message ----- From: Manus Gunning, MD Sent: 02/16/2016   7:24 AM To: Doristine Counter, RN Subject: FW: Abnormal LFTs, passed stone?               [name], this patient was discharged from the hospital on Sat night, she refused admission. She is one of Dr. Blanch Media patients and needs LFTs drawn on Monday. Can you forward to his nurse to ensure this gets done? Thanks   ----- Message ----- From: Sherwood Gambler, MD Sent: 02/15/2016   7:20 PM To: Manus Gunning, MD Subject: Abnormal LFTs, passed stone?                   [name] was adamant about not staying. I told her strict return precautions and need for repeat LFTs on Monday.  Sherwood Gambler

## 2016-02-19 ENCOUNTER — Telehealth: Payer: Self-pay | Admitting: *Deleted

## 2016-02-19 MED ORDER — INSULIN GLARGINE 300 UNIT/ML ~~LOC~~ SOPN: 5.0000 [IU] | PEN_INJECTOR | Freq: Every evening | SUBCUTANEOUS | 1 refills | Status: DC | PRN

## 2016-02-19 NOTE — Telephone Encounter (Signed)
Let's send in a toujeo pen to her pharmacy.  I'd like her to check her sugars each evening before bed and inject 5 units as long as her sugar is over 150.  She should only use it once per day. I know her husband uses insulin.  She cannot just use his.  She needs her own.  Does she have a meter herself?  If not, we will need to provide her with one.  With her liver being so abnormal right now, I'm hesitant to put her on new medications (plus they are expensive).  If we can get all of this arranged before the weekend, I can then review her sugars at [facility] appt.

## 2016-02-19 NOTE — Telephone Encounter (Signed)
Patient called and stated that she was taken off of her Metformin yesterday and now her Blood Sugar is running in the 200's. She is not sure what to do about her blood sugar. Patient has an appointment with GI tomorrow 02/20/16 for her abnormal liver functions. Please Advise.

## 2016-02-19 NOTE — Telephone Encounter (Signed)
Patient notified and agreed. Stated she will not use husband's insulin. Faxed Rx to Pharmacy.  Patient has a blood sugar machine. Patient's appointment is 02/27/2016.

## 2016-02-20 ENCOUNTER — Ambulatory Visit: Payer: Medicare HMO | Admitting: Physician Assistant

## 2016-02-20 ENCOUNTER — Other Ambulatory Visit: Payer: Self-pay

## 2016-02-20 MED ORDER — INSULIN PEN NEEDLE 32G X 4 MM MISC: 11 refills | Status: AC

## 2016-02-21 ENCOUNTER — Other Ambulatory Visit: Payer: Self-pay | Admitting: Internal Medicine

## 2016-02-21 NOTE — Telephone Encounter (Signed)
Patient called and stated that she is taking the insulin as directed and watching what she eats but still cant get her blood sugar down. Please Advise.  02/19/2016- 220 02/20/2016- 250 02/21/2016- 374

## 2016-02-21 NOTE — Telephone Encounter (Signed)
Pt of Dr Mariea Clonts, she is seeing her on Monday have her bring blood sugars and readings with her to visit.

## 2016-02-21 NOTE — Telephone Encounter (Signed)
Patient called back, concern can't get BS below 300. Spoke with Dr. Mariea Clonts, ask patient if she is take Toujeo 5 units. Patient started Wed, & Thur 5 units at bedtime. Per Dr. Mariea Clonts have her increase to 10 units at bedtime. Inform patient. Has appt with Dr. Mariea Clonts 02/27/16. Told her to be sure she brings all readings with her. She will

## 2016-02-21 NOTE — Telephone Encounter (Signed)
Sorry meant to route to Dr. Mariea Clonts. Routed to Dr. Mariea Clonts.

## 2016-02-27 ENCOUNTER — Encounter: Payer: Self-pay | Admitting: Internal Medicine

## 2016-02-27 ENCOUNTER — Ambulatory Visit (INDEPENDENT_AMBULATORY_CARE_PROVIDER_SITE_OTHER): Payer: Commercial Managed Care - HMO | Admitting: Internal Medicine

## 2016-02-27 VITALS — BP 122/70 | HR 59 | Temp 98.4°F | Wt 254.0 lb

## 2016-02-27 DIAGNOSIS — E1169 Type 2 diabetes mellitus with other specified complication: Secondary | ICD-10-CM | POA: Diagnosis not present

## 2016-02-27 DIAGNOSIS — E1140 Type 2 diabetes mellitus with diabetic neuropathy, unspecified: Secondary | ICD-10-CM | POA: Diagnosis not present

## 2016-02-27 DIAGNOSIS — E785 Hyperlipidemia, unspecified: Secondary | ICD-10-CM

## 2016-02-27 DIAGNOSIS — M170 Bilateral primary osteoarthritis of knee: Secondary | ICD-10-CM

## 2016-02-27 DIAGNOSIS — I872 Venous insufficiency (chronic) (peripheral): Secondary | ICD-10-CM

## 2016-02-27 DIAGNOSIS — E1165 Type 2 diabetes mellitus with hyperglycemia: Secondary | ICD-10-CM | POA: Diagnosis not present

## 2016-02-27 DIAGNOSIS — IMO0002 Reserved for concepts with insufficient information to code with codable children: Secondary | ICD-10-CM

## 2016-02-27 DIAGNOSIS — K7689 Other specified diseases of liver: Secondary | ICD-10-CM

## 2016-02-27 DIAGNOSIS — R945 Abnormal results of liver function studies: Secondary | ICD-10-CM

## 2016-02-27 MED ORDER — INSULIN GLARGINE 300 UNIT/ML ~~LOC~~ SOPN: 20.0000 [IU] | PEN_INJECTOR | Freq: Every day | SUBCUTANEOUS | 0 refills | Status: DC

## 2016-02-27 NOTE — Progress Notes (Signed)
Location:  [facility] clinic Provider:  Able Malloy L. Mariea [name], D.O., C.M.D.  Code Status: full code Goals of Care:  Advanced Directives 02/10/2016  Does Patient Have a Medical Advance Directive? No  Copy of Healthcare Power of Attorney in Chart? -  Would patient like information on creating a medical advance directive? -  Pre-existing out of facility DNR order (yellow form or pink MOST form) -   Chief Complaint  Patient presents with  . Follow-up    2 week follow-up with knee pain both knees    HPI: [name] seen today for 2 wk f/u on knee pain.  She did not go to [facility].  xrays revealed arthritis of her knees.  She broke out in hives with the voltaren.  She is taking aleve.  She is trying to lose some weight.  She says she does not know why she sat on her tail and didn't do anything sooner.  No longer wanting sweet drinks since drinking more water.  Eating a lot of veggies w/o a lot of seasoning.  CBGs still 243-309.  Now has a new stove, but the one drawer does not open and close properly.   Abdominal US is for tomorrow now b/c it had to be rescheduled last week.  She is off metformin.  She is still taking tradjenta.    She is using toujeo 10 units.  5 units was not bad. She is doing them in her abdomen.  She is moving sites.  Has a box of 3 pens.        She is staying off salty stuff, avoiding fried food, chicken skin.  Ankle swelling is much better.  She is Debra to get her shoes on.  Sores on her legs look a lot better.  No openings.    Past Medical History:  Diagnosis Date  . Cellulitis of left leg    history of, most recent episode 01/09  . Chronic venous insufficiency   . 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 Diverticulitis    h/o 2-3 episodes in past  . Hyperlipidemia LDL goal < 100   . Hypertension goal BP (blood pressure) < 130/80   . Leg edema    secondary to chronic venous insufficiency  . Morbid obesity ([place])   . Osteopenia    DEXA scan 7/07    Past Surgical History:  Procedure Laterality Date  . ABDOMINAL HYSTERECTOMY    . APPENDECTOMY    . CHOLECYSTECTOMY    . CHOLECYSTECTOMY N/A 04/14/2013   Procedure: LAPAROSCOPIC CHOLECYSTECTOMY WITH INTRAOPERATIVE CHOLANGIOGRAM;  Surgeon: Adin Hector, MD;  Location: [facility];  Service: General;  Laterality: N/A;  . COLECTOMY     with diverting colsotmy and revision in the 1990's for diverticulitis  . ERCP N/A 04/17/2013   Procedure: ENDOSCOPIC RETROGRADE CHOLANGIOPANCREATOGRAPHY (ERCP);  Surgeon: Irene Shipper, MD;  Location: [facility];  Service: Endoscopy;  Laterality: N/A;  note pt want general anesthesia for this case.  preferto perform in endo unit, but if unavailable please arrange time in OR  . EYE SURGERY  8/13   left cataract removal & retinal repair  . LAPAROSCOPIC LYSIS OF ADHESIONS N/A 04/14/2013   Procedure: LAPAROSCOPIC LYSIS OF ADHESIONS;  Surgeon: Adin Hector, MD;  Location: [facility];  Service: General;  Laterality: N/A;    Allergies  Allergen Reactions  . Aspirin Nausea Only    And causes bruises  . Banana Hives  . Hydrocodone Hives  .  Neomycin-Polymyxin-Gramicidin Itching and Swelling    Eye drops caused swelling in face and rash and itching on arms and neck  . Penicillins Hives and Itching  . Tramadol Nausea And Vomiting  . Latex Hives and Rash      Medication List       Accurate as of 02/27/16 11:33 AM. Always use your most recent med list.          atorvastatin 40 MG tablet Commonly known as:  LIPITOR TAKE 1 TABLET BY MOUTH EVERY DAY   furosemide 20 MG tablet Commonly known as:  LASIX TAKE 1 TABLET(20 MG) BY MOUTH DAILY FOR SWELLING   glucose blood test strip Commonly known as:  ACCU-CHEK AVIVA PLUS Ell.65 check blood sugar twice daily as directed   Insulin Pen Needle 32G X 4 MM Misc Commonly known as:  BD PEN NEEDLE NANO U/F Use once daily with the administration of Toujeo DX E11.65   lisinopril 40 MG tablet Commonly  known as:  PRINIVIL,ZESTRIL TAKE 1 TABLET BY MOUTH DAILY   LYRICA 75 MG capsule Generic drug:  pregabalin TAKE 1 CAPSULE BY MOUTH THREE TIMES DAILY AS NEEDED   metoprolol succinate 50 MG 24 hr tablet Commonly known as:  TOPROL-XL TAKE 1 TABLET BY MOUTH ONCE DAILY   omeprazole 20 MG capsule Commonly known as:  PRILOSEC TAKE 1 CAPSULE BY MOUTH DAILY   potassium chloride SA 20 MEQ tablet Commonly known as:  K-DUR,KLOR-CON TAKE 1 TABLET(20 MEQ) BY MOUTH DAILY   TOUJEO SOLOSTAR 300 UNIT/ML Sopn Generic drug:  Insulin Glargine Inject 10 Units into the skin at bedtime.   TRADJENTA 5 MG Tabs tablet Generic drug:  linagliptin TAKE 1 TABLET BY MOUTH EVERY DAY       Review of Systems:  Review of Systems  Constitutional: Negative for chills and fever.  HENT: Negative for congestion.   Eyes: Negative for blurred vision.  Respiratory: Negative for cough and shortness of breath.   Cardiovascular: Positive for leg swelling. Negative for chest pain and palpitations.       Edema improved with dietary changes  Gastrointestinal: Negative for abdominal pain, blood in stool, constipation and melena.       No longer having nausea, vomiting  Genitourinary: Negative for dysuria.  Musculoskeletal: Positive for joint pain. Negative for falls.       Bilateral knees  Skin: Negative for rash.       Left great toe ulcer and callous still present  Neurological: Positive for dizziness and sensory change.  Endo/Heme/Allergies:       Diabetes    Health Maintenance  Topic Date Due  . Hepatitis C Screening  28-Dec-1945  . ZOSTAVAX  10/06/2006  . MAMMOGRAM  09/22/2012  . OPHTHALMOLOGY EXAM  03/14/2013  . PNA vac Low Risk Adult (2 of 2 - PPSV23) 02/23/2015  . COLONOSCOPY  07/21/2016 (Originally 10/05/1996)  . HEMOGLOBIN A1C  05/12/2016  . FOOT EXAM  07/03/2016  . LIPID PANEL  07/07/2016  . TETANUS/TDAP  09/10/2020  . INFLUENZA VACCINE  Completed  . DEXA SCAN  Completed    Physical  Exam: Vitals:   02/27/16 1117  BP: 122/70  Pulse: (!) 59  Temp: 98.4 F (36.9 C)  TempSrc: Oral  SpO2: 96%  Weight: 254 lb (115.2 kg)   Body mass index is 43.6 kg/m. Physical Exam  Constitutional: She is oriented to person, place, and time. She appears well-developed and well-nourished. No distress.  Cardiovascular: Normal rate, regular rhythm and normal heart sounds.  Pulmonary/Chest: Effort normal and breath sounds normal. No respiratory distress.  Abdominal: Soft. Bowel sounds are normal. She exhibits no distension. There is no tenderness. There is no guarding.  Musculoskeletal: Normal range of motion.  Walks with wide-based gait with feet turned out laterally  Neurological: She is alert and oriented to person, place, and time.  Skin: Skin is warm and dry.  Left great toe with callous and ulcer--unstageable     Labs reviewed: Basic Metabolic Panel:  Recent Labs  07/08/15 1023 02/10/16 1544 02/15/16 1501  NA 143 140 140  K 4.3 4.4 3.8  CL 102 102 103  CO2 23 28 26   GLUCOSE 198* 193* 207*  BUN 21 15 24*  CREATININE 0.76 0.71 0.81  CALCIUM 8.6* 9.2 9.0   Liver Function Tests:  Recent Labs  02/10/16 1544 02/15/16 1501 02/17/16 0845  AST 20 411* 175*  ALT 18 177* 279*  ALKPHOS 74 177* 184*  BILITOT 0.6 2.4* 3.5*  PROT 6.5 7.1 6.4  ALBUMIN 3.8 4.0 3.6    Recent Labs  02/15/16 1501 02/17/16 0845  LIPASE 33 39  AMYLASE  --  19   No results for input(s): AMMONIA in the last 8760 hours. CBC:  Recent Labs  07/08/15 1023 02/15/16 1501  WBC 6.2 3.8*  NEUTROABS 3.0  --   HGB  --  14.0  HCT 41.6 42.2  MCV 91 91.3  PLT 268 211   Lipid Panel:  Recent Labs  07/08/15 1023  CHOL 151  HDL 41  LDLCALC 68  TRIG 209*  CHOLHDL 3.7   Lab Results  Component Value Date   HGBA1C 7.4 (H) 02/10/2016    Procedures since last visit: Ct Abdomen Pelvis W Contrast  Result Date: 02/15/2016 CLINICAL DATA:  Abdominal pain, back pain, nausea, burning  urination starting this morning EXAM: CT ABDOMEN AND PELVIS WITH CONTRAST TECHNIQUE: Multidetector CT imaging of the abdomen and pelvis was performed using the standard protocol following bolus administration of intravenous contrast. CONTRAST:  153mL ISOVUE-300 IOPAMIDOL (ISOVUE-300) INJECTION 61% COMPARISON:  CT scan 03/11/2008 FINDINGS: Lower chest: No acute findings lung bases.  Small hiatal hernia. Hepatobiliary: The patient is status post cholecystectomy. Small pneumobilia noted in left hepatic lobe. No focal hepatic mass. Pancreas: There is atrophic fatty replaced pancreas. No dilatation of main pancreatic duct. No evidence of acute pancreatitis. Spleen: Enhanced spleen is unremarkable. Adrenals/Urinary Tract: No right adrenal mass. There is a low-density nodule left adrenal gland measures 1.8 cm probable adenoma. Kidneys are symmetrical in size and enhancement. No hydronephrosis or hydroureter. No evidence of nephrolithiasis. The urinary bladder is unremarkable. Stomach/Bowel: There is no small bowel obstruction. Again noted large ventral hernia midline anterior abdominal wall at the level of umbilicus extending in lower pelvic wall. The hernia measures at least 15.5 cm cranial caudally. Measures about 15 cm transverse diameter and about 8 cm AP diameter. The hernia is containing omental fat small bowel loops and segment of transverse colon. There is no evidence of acute complication small bowel or colonic obstruction. No pericecal inflammation. The patient is status post appendectomy. No distal colonic obstruction. Some stool noted within distal sigmoid colon and rectum. Few diverticula are noted sigmoid colon without evidence of acute diverticulitis. Vascular/Lymphatic: No retroperitoneal adenopathy. Atherosclerotic calcifications of abdominal aorta and iliac arteries. No aortic aneurysm. Reproductive: The patient is status post hysterectomy. Again noted a cystic lesion in left posterior or pelvic with  anterior calcifications. The lesion measures 3.7 by 2.9 cm. On the prior exam  measures 3.4 x 2.2 cm. Other: No ascites or free abdominal air. Musculoskeletal: No destructive bony lesions are noted. Sagittal images of the spine shows mild degenerative changes lumbar spine. IMPRESSION: 1. No hydronephrosis or hydroureter.  Unremarkable urinary bladder. 2. Status postcholecystectomy. Small pneumobilia is noted in left hepatic lobe. 3. There is atrophic partially fatty replaced pancreas. 4. Again noted large ventral hernia midline lower abdominal wall and anterior pelvic wall. The hernia measures 15.5 x 15 x 8 cm. Is containing omental fat small bowel loops and segment of the colon. There is no evidence of acute complication small bowel or colonic obstruction. 5. Few diverticula are noted distal colon. No evidence of acute diverticulitis or colitis. No colonic obstruction. 6. There is a low-density nodule within left adrenal gland measures 1.8 cm probable adenoma. 7. Progression in size of cystic lesion in left posterior pelvis measures 3.7 x 2.9 cm. On the prior exam measures 3.4 x 2.2 cm. Again noted calcifications the patient is status post hysterectomy. Anterior aspect of the lesion. Electronically Signed   By: Lahoma Crocker M.D.   On: 02/15/2016 18:26   Dg Knee Complete 4 Views Left  Result Date: 02/11/2016 CLINICAL DATA:  Pain, chronic EXAM: LEFT KNEE - COMPLETE 4+ VIEW COMPARISON:  None. FINDINGS: Standing frontal, standing lateral, standing tunnel, and sunrise patellar images were obtained. There is no evident fracture or dislocation. There is a small joint effusion. There is marked narrowing laterally with essentially complete loss disc space laterally. There is moderately severe narrowing of the patellofemoral joint. There is no erosive change. There are foci of vascular calcification anteriorly with questionable chronic calcified thrombus in the left anterior tibial vein. Multiple phleboliths also present.  IMPRESSION: Marked osteoarthritic change, most severe laterally but also moderately severe in the patellofemoral joint region. Small joint effusion. No fracture or dislocation. 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 Electronically Signed   By: Lowella Grip III M.D.   On: 02/11/2016 16:15   Dg Knee Complete 4 Views Right  Result Date: 02/11/2016 CLINICAL DATA:  Chronic pain EXAM: RIGHT KNEE - COMPLETE 4+ VIEW COMPARISON:  None. FINDINGS: Standing frontal, standing tunnel, standing lateral, and sunrise patellar images were obtained it. There is no fracture or dislocation. There is a minimal joint effusion. There is moderately severe joint space narrowing medially and in the patellofemoral joint. There is spurring in all compartments. There is chondrocalcinosis. IMPRESSION: Osteoarthritic change, most marked medially and in the patellofemoral joint regions. Calcinosis present, that may be seen with osteoarthritis or calcium pyrophosphate deposition disease. Minimal joint effusion. Electronically Signed   By: Lowella Grip III M.D.   On: 02/11/2016 16:16    Assessment/Plan 1. Type 2 diabetes, uncontrolled, with neuropathy (HCC) -increase insulin from 10 to 20 units as glucose values are all in the 200s and one in the 300s - Insulin Glargine (TOUJEO SOLOSTAR) 300 UNIT/ML SOPN; Inject 20 Units into the skin at bedtime.  Dispense: 3 pen; Refill: 0  2. Primary osteoarthritis of both knees -ongoing, is doing better using aleve -discussed risks of aleve, but pt also cannot use tylenol now due to her liver issue  3. Abnormal liver function -awaits RUQ [place] -also stop lipitor  4. Chronic venous insufficiency -cont to avoid high sodium foods, avoid adding salt, elevate feet at rest -she has refused to wear compression hose  5. Hyperlipidemia associated with type 2 diabetes mellitus ([place]) -stop lipitor due to liver abnormality  6. Morbid obesity due to  excess  calories ([place]) -ongoing, says she is working on her diet, has lost a few lbs per her report, but weighs one more lb on our scale  Labs/tests ordered:  No orders of the defined types were placed in this encounter.  Next appt:  03/27/2016  Khalia Gong L. Arryanna Holquin, D.O. [facility] [address], [place] [address] Cell Phone (Mon-Fri 8am-5pm):  [phone] On Call:  [phone] & follow prompts after 5pm & weekends Office Phone:  [phone] Office Fax:  [phone]

## 2016-02-28 ENCOUNTER — Ambulatory Visit (INDEPENDENT_AMBULATORY_CARE_PROVIDER_SITE_OTHER): Payer: Commercial Managed Care - HMO | Admitting: Physician Assistant

## 2016-02-28 ENCOUNTER — Other Ambulatory Visit (INDEPENDENT_AMBULATORY_CARE_PROVIDER_SITE_OTHER): Payer: Commercial Managed Care - HMO

## 2016-02-28 ENCOUNTER — Other Ambulatory Visit: Payer: Self-pay

## 2016-02-28 ENCOUNTER — Encounter: Payer: Self-pay | Admitting: Physician Assistant

## 2016-02-28 VITALS — BP 118/70 | HR 66 | Ht 64.0 in | Wt 255.0 lb

## 2016-02-28 DIAGNOSIS — R945 Abnormal results of liver function studies: Principal | ICD-10-CM

## 2016-02-28 DIAGNOSIS — R110 Nausea: Secondary | ICD-10-CM

## 2016-02-28 DIAGNOSIS — R7989 Other specified abnormal findings of blood chemistry: Secondary | ICD-10-CM | POA: Diagnosis not present

## 2016-02-28 LAB — CBC WITH DIFFERENTIAL/PLATELET
Basophils Absolute: 0 10*3/uL (ref 0.0–0.1)
Basophils Relative: 0.5 % (ref 0.0–3.0)
EOS PCT: 3.6 % (ref 0.0–5.0)
Eosinophils Absolute: 0.4 10*3/uL (ref 0.0–0.7)
HCT: 40.8 % (ref 36.0–46.0)
Hemoglobin: 13.7 g/dL (ref 12.0–15.0)
LYMPHS ABS: 2.9 10*3/uL (ref 0.7–4.0)
Lymphocytes Relative: 28.6 % (ref 12.0–46.0)
MCHC: 33.7 g/dL (ref 30.0–36.0)
MCV: 89.4 fl (ref 78.0–100.0)
MONOS PCT: 7.1 % (ref 3.0–12.0)
Monocytes Absolute: 0.7 10*3/uL (ref 0.1–1.0)
NEUTROS ABS: 6.1 10*3/uL (ref 1.4–7.7)
NEUTROS PCT: 60.2 % (ref 43.0–77.0)
Platelets: 342 10*3/uL (ref 150.0–400.0)
RBC: 4.56 Mil/uL (ref 3.87–5.11)
RDW: 14.4 % (ref 11.5–15.5)
WBC: 10.1 10*3/uL (ref 4.0–10.5)

## 2016-02-28 LAB — COMPREHENSIVE METABOLIC PANEL
ALK PHOS: 148 U/L — AB (ref 39–117)
ALT: 33 U/L (ref 0–35)
AST: 21 U/L (ref 0–37)
Albumin: 3.8 g/dL (ref 3.5–5.2)
BUN: 20 mg/dL (ref 6–23)
CO2: 31 mEq/L (ref 19–32)
Calcium: 9.5 mg/dL (ref 8.4–10.5)
Chloride: 103 mEq/L (ref 96–112)
Creatinine, Ser: 0.79 mg/dL (ref 0.40–1.20)
GFR: 76.61 mL/min (ref >60.00)
Glucose, Bld: 339 mg/dL — ABNORMAL HIGH (ref 70–99)
POTASSIUM: 4.7 meq/L (ref 3.5–5.1)
Sodium: 139 mEq/L (ref 135–145)
TOTAL PROTEIN: 7.3 g/dL (ref 6.0–8.3)
Total Bilirubin: 0.6 mg/dL (ref 0.2–1.2)

## 2016-02-28 LAB — LIPASE: LIPASE: 50 U/L (ref 11.0–59.0)

## 2016-02-28 NOTE — Patient Instructions (Signed)
Your physician has requested that you go to the basement for the following lab work before leaving today: Cbc, cmet, lipase

## 2016-02-28 NOTE — Progress Notes (Signed)
Chief Complaint: Elevated liver function tests  HPI:  Debra [name] is a 70 year old Caucasian female with a past medical history significant for cholecystectomy and ERCP in January 2015, hyperlipidemia, hypertension, morbid obesity, diverticula of cysts, chronic venous insufficiency and diabetes,  who was referred to me by Gayland Curry, DO for a complaint of elevated liver function tests. Patient saw Dr. Henrene Pastor in 2015 while she was in the hospital for ERCP.    Most recently on 02/15/16 the patient presented to the ED with right upper quadrant abdominal discomfort, reflux, heartburn and nausea with vomiting. At that time she had labs which showed elevated liver enzymes with an AST of 411, ALT 177, alkaline phosphatase 177 and total bilirubin of 2.4. At that time she also had a CT of the abdomen which showed a small pneumobilia in the left hepatic lobe as well as atrophic partially fatty replaced pancreas and a large ventral hernia. This also showed a few diverticula and a low-density nodule in the patient's left adrenal gland as well as progression in size of a cystic lesion in the left posterior pelvis. It was recommended at that time the patient stay overnight in the hospital but she declined. She was given strict return precautions and told to get repeat labs in 2 days. Patient then had repeat liver function tests on 02/17/2016 which showed an increased bilirubin at 3.5, alkaline phosphatase 184, AST 175 and ALT 279. She was then told to proceed to our office.   Over the past 10-11 days the patient has had no further symptoms. She tells me that she has had no further indigestion, no nausea or vomiting and her abdominal pain is completely relieved. She is aware that she was told to stay overnight in the hospital but tells me that she doesn't think anything is going on anymore. Her largest complaint today is that she was recently changed to insulin and doesn't like to give herself injections. She is happy  though that she no longer has diarrhea after being off of her metformin.   Patient denies fever, chills, blood in her stool, melena, change in night, weight loss, fatigue, anorexia, nausea, vomiting, heartburn, reflux, dysphagia or abdominal pain.  Past Medical History:  Diagnosis Date  . Cellulitis of left leg    history of, most recent episode 01/09  . Chronic venous insufficiency   . Diabetes mellitus with neurological manifestation ([place])   . Diverticulitis    h/o 2-3 episodes in past  . Hyperlipidemia LDL goal < 100   . Hypertension goal BP (blood pressure) < 130/80   . Leg edema    secondary to chronic venous insufficiency  . Morbid obesity ([place])   . Osteopenia    DEXA scan 7/07    Past Surgical History:  Procedure Laterality Date  . ABDOMINAL HYSTERECTOMY    . APPENDECTOMY    . CHOLECYSTECTOMY    . CHOLECYSTECTOMY N/A 04/14/2013   Procedure: LAPAROSCOPIC CHOLECYSTECTOMY WITH INTRAOPERATIVE CHOLANGIOGRAM;  Surgeon: Adin Hector, MD;  Location: [facility];  Service: General;  Laterality: N/A;  . COLECTOMY     with diverting colsotmy and revision in the 1990's for diverticulitis  . ERCP N/A 04/17/2013   Procedure: ENDOSCOPIC RETROGRADE CHOLANGIOPANCREATOGRAPHY (ERCP);  Surgeon: Irene Shipper, MD;  Location: [facility];  Service: Endoscopy;  Laterality: N/A;  note pt want general anesthesia for this case.  preferto perform in endo unit, but if unavailable please arrange time in OR  . EYE SURGERY  8/13  left cataract removal & retinal repair  . LAPAROSCOPIC LYSIS OF ADHESIONS N/A 04/14/2013   Procedure: LAPAROSCOPIC LYSIS OF ADHESIONS;  Surgeon: Adin Hector, MD;  Location: [facility];  Service: General;  Laterality: N/A;    Current Outpatient Prescriptions  Medication Sig Dispense Refill  . furosemide (LASIX) 20 MG tablet TAKE 1 TABLET(20 MG) BY MOUTH DAILY FOR SWELLING 30 tablet 6  . glucose blood (ACCU-CHEK AVIVA PLUS) test strip Ell.65 check blood sugar twice daily as  directed 100 each 11  . Insulin Glargine (TOUJEO SOLOSTAR) 300 UNIT/ML SOPN Inject 20 Units into the skin at bedtime. 3 pen 0  . Insulin Pen Needle (BD PEN NEEDLE NANO U/F) 32G X 4 MM MISC Use once daily with the administration of Toujeo DX E11.65 100 each 11  . lisinopril (PRINIVIL,ZESTRIL) 40 MG tablet TAKE 1 TABLET BY MOUTH DAILY 90 tablet 0  . LYRICA 75 MG capsule TAKE 1 CAPSULE BY MOUTH THREE TIMES DAILY AS NEEDED 90 capsule 0  . metoprolol succinate (TOPROL-XL) 50 MG 24 hr tablet TAKE 1 TABLET BY MOUTH ONCE DAILY 30 tablet 6  . omeprazole (PRILOSEC) 20 MG capsule TAKE 1 CAPSULE BY MOUTH DAILY 90 capsule 0  . potassium chloride SA (K-DUR,KLOR-CON) 20 MEQ tablet TAKE 1 TABLET(20 MEQ) BY MOUTH DAILY 30 tablet 3  . TRADJENTA 5 MG TABS tablet TAKE 1 TABLET BY MOUTH EVERY DAY 90 tablet 0   No current facility-administered medications for this visit.     Allergies as of 02/28/2016 - Review Complete 02/28/2016  Allergen Reaction Noted  . Aspirin Nausea Only   . Banana Hives 12/30/2011  . Hydrocodone Hives   . Neomycin-polymyxin-gramicidin Itching and Swelling 03/18/2012  . Penicillins Hives and Itching   . Tramadol Nausea And Vomiting 05/03/2013  . Voltaren [diclofenac sodium]  02/27/2016  . Latex Hives and Rash     Family History  Problem Relation Age of Onset  . Heart disease Mother   . Heart disease Father   . Heart disease Brother   . Cancer Brother   . Cancer Brother   . Hypertension Sister   . Heart disease Sister     Social History   Social History  . Marital status: Married    Spouse name: N/A  . Number of children: N/A  . Years of education: N/A   Occupational History  . Not on file.   Social History Main Topics  . Smoking status: Never Smoker  . Smokeless tobacco: Never Used  . Alcohol use No  . Drug use: No  . Sexual activity: No   Other Topics Concern  . Not on file   Social History Narrative   One sister with chronic progressive disease requiring a  trach, and 24/7 care   One sister with an abrupt hospitalization for a critical condition    Review of Systems:     Constitutional: No weight loss, fever, chills, weakness or fatigue Cardiovascular: No chest pain Respiratory: No SOB  Gastrointestinal: See HPI and otherwise negative   Physical Exam:  Vital signs: BP 118/70   Pulse 66   Ht 5\' 4"  (1.626 m)   Wt 255 lb (115.7 kg)   LMP 07/02/1973   BMI 43.77 kg/m   Constitutional:   Pleasant Caucasian female appears to be in NAD, Well developed, Well nourished, alert and cooperative Respiratory: Respirations even and unlabored. Lungs clear to auscultation bilaterally.   No wheezes, crackles, or rhonchi.  Cardiovascular: Normal S1, S2. No MRG. Regular rate  and rhythm. No peripheral edema, cyanosis or pallor.  Gastrointestinal:  Soft, nondistended, nontender. No rebound or guarding. Normal bowel sounds. No appreciable masses or hepatomegaly. Rectal:  Not performed.   MOST RECENT LABS: CBC    Component Value Date/Time   WBC 3.8 (L) 02/15/2016 1501   RBC 4.62 02/15/2016 1501   HGB 14.0 02/15/2016 1501   HCT 42.2 02/15/2016 1501   HCT 41.6 07/08/2015 1023   PLT 211 02/15/2016 1501   PLT 268 07/08/2015 1023   MCV 91.3 02/15/2016 1501   MCV 91 07/08/2015 1023   MCH 30.3 02/15/2016 1501   MCHC 33.2 02/15/2016 1501   RDW 14.0 02/15/2016 1501   RDW 14.7 07/08/2015 1023   LYMPHSABS 2.5 07/08/2015 1023   MONOABS 0.6 06/29/2013 0540   EOSABS 0.2 07/08/2015 1023   BASOSABS 0.0 07/08/2015 1023    CMP     Component Value Date/Time   NA 140 02/15/2016 1501   NA 143 07/08/2015 1023   K 3.8 02/15/2016 1501   CL 103 02/15/2016 1501   CO2 26 02/15/2016 1501   GLUCOSE 207 (H) 02/15/2016 1501   BUN 24 (H) 02/15/2016 1501   BUN 21 07/08/2015 1023   CREATININE 0.81 02/15/2016 1501   CREATININE 0.71 02/10/2016 1544   CALCIUM 9.0 02/15/2016 1501   PROT 6.4 02/17/2016 0845   PROT 6.5 10/03/2013 0920   ALBUMIN 3.6 02/17/2016 0845    ALBUMIN 4.3 10/03/2013 0920   AST 175 (H) 02/17/2016 0845   ALT 279 (H) 02/17/2016 0845   ALKPHOS 184 (H) 02/17/2016 0845   BILITOT 3.5 (H) 02/17/2016 0845   GFRNONAA >60 02/15/2016 1501   GFRNONAA 87 02/10/2016 1544   GFRAA >60 02/15/2016 1501   GFRAA >89 02/10/2016 1544    Assessment: 1. Elevated LFT's: At the time patient's liver enzymes were elevated she was also having symptoms of nausea, abdominal pain, indigestion, etc., but this has subsided over the past 10 days. She is no longer experiencing these symptoms. Patient does have history of choledocholithiasis and cholecystectomy in January 2015. Likely this represented a bile duct stone which has now passed. I did have a discussion with the patient regarding this. Answered her questions.  Plan: 1. We will recheck liver function tests today as well as a CBC to ensure that things are returning to normal. If she does continue with elevated liver enzymes would recommend further imaging, possibly MRCP for further evaluation. We did discuss this today. 2. Patient to follow in clinic as needed in the future as directed after upcoming labs with Dr. Henrene Pastor or myself.  Ellouise Newer, PA-C [facility] 02/28/2016, 4:00 PM  Cc: Gayland Curry, DO

## 2016-03-02 NOTE — Progress Notes (Signed)
Assessment and plans reviewed. Despite the patient's reluctance, she will require a more thorough evaluation to rule out recurrent choledocholithiasis should she have persistent lab abnormalities or recurrent symptoms.

## 2016-03-09 ENCOUNTER — Ambulatory Visit (INDEPENDENT_AMBULATORY_CARE_PROVIDER_SITE_OTHER): Payer: Commercial Managed Care - HMO | Admitting: Internal Medicine

## 2016-03-09 ENCOUNTER — Encounter: Payer: Self-pay | Admitting: Internal Medicine

## 2016-03-09 VITALS — BP 128/78 | HR 62 | Temp 98.1°F | Wt 255.0 lb

## 2016-03-09 DIAGNOSIS — E1140 Type 2 diabetes mellitus with diabetic neuropathy, unspecified: Secondary | ICD-10-CM | POA: Diagnosis not present

## 2016-03-09 DIAGNOSIS — E1165 Type 2 diabetes mellitus with hyperglycemia: Secondary | ICD-10-CM | POA: Diagnosis not present

## 2016-03-09 DIAGNOSIS — K8050 Calculus of bile duct without cholangitis or cholecystitis without obstruction: Secondary | ICD-10-CM | POA: Diagnosis not present

## 2016-03-09 DIAGNOSIS — IMO0002 Reserved for concepts with insufficient information to code with codable children: Secondary | ICD-10-CM

## 2016-03-09 DIAGNOSIS — I872 Venous insufficiency (chronic) (peripheral): Secondary | ICD-10-CM

## 2016-03-09 MED ORDER — GLIPIZIDE ER 10 MG PO TB24: 10.0000 mg | ORAL_TABLET | Freq: Every day | ORAL | 3 refills | Status: DC

## 2016-03-09 NOTE — Progress Notes (Signed)
Location:  [facility] clinic Provider:  Prathik Aman L. Mariea Clonts, D.O., C.M.D.  Code Status: full code Goals of Care:  Advanced Directives 02/10/2016  Does Patient Have a Medical Advance Directive? No  Copy of Healthcare Power of Attorney in Chart? -  Would patient like information on creating a medical advance directive? -  Pre-existing out of facility DNR order (yellow form or pink MOST form) -   Chief Complaint  Patient presents with  . Follow-up    1 week follow-up    HPI: [name] seen today for f/u on blood sugar and GI visit for elevated hepatic enzymes from 02/27/16 with PA Lemmon.  She has a h/o choledocholithiasis and chole in Jan '15.  She is felt to have had a bile duct stone that passed.  If liver enzymes remain elevated, she will need MRCP for further eval.  Dr. Henrene Pastor also saw her.  Repeats from that day were much improved with alk phos being the only persistently elevated one (148).  AST and ALT normal.  It was recommended they be rechecked one more time in another 2-3 wks to ensure normalization of AP also.  She reports her glucose values remain elevated despite the increase in toujeo to 20 units qhs.  She is still on tradjenta also.  It's going up and down. Most are still in the 200s to 300s.  Says she is eating much better and drinking a lot of water.  She is urinating a lot.  Discussed importance of eating before taking the glipizide each and every time.    Says she has been irritable today.  Someone made her mad. Blames that for her elevated BP.     Past Medical History:  Diagnosis Date  . Cellulitis of left leg    history of, most recent episode 01/09  . Chronic venous insufficiency   . Diabetes mellitus with neurological manifestation ([place])   . Diverticulitis    h/o 2-3 episodes in past  . Hyperlipidemia LDL goal < 100   . Hypertension goal BP (blood pressure) < 130/80   . Leg edema    secondary to chronic venous insufficiency  . Morbid obesity ([place])     . Osteopenia    DEXA scan 7/07    Past Surgical History:  Procedure Laterality Date  . ABDOMINAL HYSTERECTOMY    . APPENDECTOMY    . CHOLECYSTECTOMY    . CHOLECYSTECTOMY N/A 04/14/2013   Procedure: LAPAROSCOPIC CHOLECYSTECTOMY WITH INTRAOPERATIVE CHOLANGIOGRAM;  Surgeon: Adin Hector, MD;  Location: [facility];  Service: General;  Laterality: N/A;  . COLECTOMY     with diverting colsotmy and revision in the 1990's for diverticulitis  . ERCP N/A 04/17/2013   Procedure: ENDOSCOPIC RETROGRADE CHOLANGIOPANCREATOGRAPHY (ERCP);  Surgeon: Irene Shipper, MD;  Location: [facility];  Service: Endoscopy;  Laterality: N/A;  note pt want general anesthesia for this case.  preferto perform in endo unit, but if unavailable please arrange time in OR  . EYE SURGERY  8/13   left cataract removal & retinal repair  . LAPAROSCOPIC LYSIS OF ADHESIONS N/A 04/14/2013   Procedure: LAPAROSCOPIC LYSIS OF ADHESIONS;  Surgeon: Adin Hector, MD;  Location: [facility];  Service: General;  Laterality: N/A;    Allergies  Allergen Reactions  . Aspirin Nausea Only    And causes bruises  . Banana Hives  . Hydrocodone Hives  . Neomycin-Polymyxin-Gramicidin Itching and Swelling    Eye drops caused swelling in face and rash  and itching on arms and neck  . Penicillins Hives and Itching  . Tramadol Nausea And Vomiting  . Voltaren [Diclofenac Sodium]   . Latex Hives and Rash    Allergies as of 03/09/2016      Reactions   Aspirin Nausea Only   And causes bruises   Banana Hives   Hydrocodone Hives   Neomycin-polymyxin-gramicidin Itching, Swelling   Eye drops caused swelling in face and rash and itching on arms and neck   Penicillins Hives, Itching   Tramadol Nausea And Vomiting   Voltaren [diclofenac Sodium]    Latex Hives, Rash      Medication List       Accurate as of 03/09/16  3:44 PM. Always use your most recent med list.          furosemide 20 MG tablet Commonly known as:  LASIX TAKE 1 TABLET(20  MG) BY MOUTH DAILY FOR SWELLING   glucose blood test strip Commonly known as:  ACCU-CHEK AVIVA PLUS Ell.65 check blood sugar twice daily as directed   Insulin Glargine 300 UNIT/ML Sopn Commonly known as:  TOUJEO SOLOSTAR Inject 20 Units into the skin at bedtime.   Insulin Pen Needle 32G X 4 MM Misc Commonly known as:  BD PEN NEEDLE NANO U/F Use once daily with the administration of Toujeo DX E11.65   lisinopril 40 MG tablet Commonly known as:  PRINIVIL,ZESTRIL TAKE 1 TABLET BY MOUTH DAILY   LYRICA 75 MG capsule Generic drug:  pregabalin TAKE 1 CAPSULE BY MOUTH THREE TIMES DAILY AS NEEDED   metoprolol succinate 50 MG 24 hr tablet Commonly known as:  TOPROL-XL TAKE 1 TABLET BY MOUTH ONCE DAILY   omeprazole 20 MG capsule Commonly known as:  PRILOSEC TAKE 1 CAPSULE BY MOUTH DAILY   potassium chloride SA 20 MEQ tablet Commonly known as:  K-DUR,KLOR-CON TAKE 1 TABLET(20 MEQ) BY MOUTH DAILY   TRADJENTA 5 MG Tabs tablet Generic drug:  linagliptin TAKE 1 TABLET BY MOUTH EVERY DAY       Review of Systems:  Review of Systems  Constitutional: Negative for chills, fever and malaise/fatigue.  HENT: Negative for congestion.   Eyes: Negative for blurred vision.  Respiratory: Negative for shortness of breath.   Cardiovascular: Positive for leg swelling. Negative for chest pain and palpitations.  Gastrointestinal: Negative for abdominal pain, blood in stool, constipation, diarrhea and melena.  Genitourinary: Negative for dysuria.  Musculoskeletal: Negative for falls.  Skin: Negative for itching and rash.       Left great toe ulcer and callous still present  Neurological: Positive for tingling and sensory change. Negative for weakness.       Bilateral feet  Psychiatric/Behavioral: Negative for depression, memory loss and suicidal ideas. The patient does not have insomnia.     Health Maintenance  Topic Date Due  . Hepatitis C Screening  [birth year]  . ZOSTAVAX  10/06/2006  .  MAMMOGRAM  09/22/2012  . OPHTHALMOLOGY EXAM  03/14/2013  . PNA vac Low Risk Adult (2 of 2 - PPSV23) 02/23/2015  . COLONOSCOPY  07/21/2016 (Originally 10/05/1996)  . HEMOGLOBIN A1C  05/12/2016  . FOOT EXAM  07/03/2016  . LIPID PANEL  07/07/2016  . TETANUS/TDAP  09/10/2020  . INFLUENZA VACCINE  Completed  . DEXA SCAN  Completed    Physical Exam: Vitals:   03/09/16 1513  BP: (!) 148/78  Pulse: 62  Temp: 98.1 F (36.7 C)  TempSrc: Oral  SpO2: 95%  Weight: 255 lb (115.7 kg)  Body mass index is 43.77 kg/m. Physical Exam  Constitutional: She appears well-developed and well-nourished. No distress.  Cardiovascular: Normal rate, regular rhythm, normal heart sounds and intact distal pulses.   Bilateral edema, nonpitting at present, some venous stasis dermatitis--mild, no leakage at present  Pulmonary/Chest: Effort normal and breath sounds normal.  Abdominal: Soft. Bowel sounds are normal. She exhibits no distension and no mass. There is no tenderness. There is no rebound and no guarding.  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  Left great toe blister with callous still present and still refuses to return to [facility]  Psychiatric: She has a normal mood and affect.    Labs reviewed: Basic Metabolic Panel:  Recent Labs  02/10/16 1544 02/15/16 1501 02/28/16 1420  NA 140 140 139  K 4.4 3.8 4.7  CL 102 103 103  CO2 _0 GLUCOSE 193* 207* 339*  BUN 15 24* 20  CREATININE 0.71 0.81 0.79  CALCIUM 9.2 9.0 9.5   Liver Function Tests:  Recent Labs  02/15/16 1501 02/17/16 0845 02/28/16 1420  AST 411* 175* 21  ALT 177* 279* 33  ALKPHOS 177* 184* 148*  BILITOT 2.4* 3.5* 0.6  PROT 7.1 6.4 7.3  ALBUMIN 4.0 3.6 3.8    Recent Labs  02/15/16 1501 02/17/16 0845 02/28/16 1420  LIPASE 33 39 50.0  AMYLASE  --  19  --    No results for input(s): AMMONIA in the last 8760 hours. CBC:  Recent Labs  07/08/15 1023  02/15/16 1501 02/28/16 1420  WBC 6.2 3.8* 10.1  NEUTROABS 3.0  --  6.1  HGB  --  14.0 13.7  HCT 41.6 42.2 40.8  MCV 91 91.3 89.4  PLT 268 211 342.0   Lipid Panel:  Recent Labs  07/08/15 1023  CHOL 151  HDL 41  LDLCALC 68  TRIG 209*  CHOLHDL 3.7   Lab Results  Component Value Date   HGBA1C 7.4 (H) 02/10/2016    Procedures since last visit: Ct Abdomen Pelvis W Contrast  Result Date: 02/15/2016 CLINICAL DATA:  Abdominal pain, back pain, nausea, burning urination starting this morning EXAM: CT ABDOMEN AND PELVIS WITH CONTRAST TECHNIQUE: Multidetector CT imaging of the abdomen and pelvis was performed using the standard protocol following bolus administration of intravenous contrast. CONTRAST:  177m ISOVUE-300 IOPAMIDOL (ISOVUE-300) INJECTION 61% COMPARISON:  CT scan 03/11/2008 FINDINGS: Lower chest: No acute findings lung bases.  Small hiatal hernia. Hepatobiliary: The patient is status post cholecystectomy. Small pneumobilia noted in left hepatic lobe. No focal hepatic mass. Pancreas: There is atrophic fatty replaced pancreas. No dilatation of main pancreatic duct. No evidence of acute pancreatitis. Spleen: Enhanced spleen is unremarkable. Adrenals/Urinary Tract: No right adrenal mass. There is a low-density nodule left adrenal gland measures 1.8 cm probable adenoma. Kidneys are symmetrical in size and enhancement. No hydronephrosis or hydroureter. No evidence of nephrolithiasis. The urinary bladder is unremarkable. Stomach/Bowel: There is no small bowel obstruction. Again noted large ventral hernia midline anterior abdominal wall at the level of umbilicus extending in lower pelvic wall. The hernia measures at least 15.5 cm cranial caudally. Measures about 15 cm transverse diameter and about 8 cm AP diameter. The hernia is containing omental fat small bowel loops and segment of transverse colon. There is no evidence of acute complication small bowel or colonic obstruction. No pericecal  inflammation. The patient is status post appendectomy. No distal colonic obstruction. Some stool noted within  distal sigmoid colon and rectum. Few diverticula are noted sigmoid colon without evidence of acute diverticulitis. Vascular/Lymphatic: No retroperitoneal adenopathy. Atherosclerotic calcifications of abdominal aorta and iliac arteries. No aortic aneurysm. Reproductive: The patient is status post hysterectomy. Again noted a cystic lesion in left posterior or pelvic with anterior calcifications. The lesion measures 3.7 by 2.9 cm. On the prior exam measures 3.4 x 2.2 cm. Other: No ascites or free abdominal air. Musculoskeletal: No destructive bony lesions are noted. Sagittal images of the spine shows mild degenerative changes lumbar spine. IMPRESSION: 1. No hydronephrosis or hydroureter.  Unremarkable urinary bladder. 2. Status postcholecystectomy. Small pneumobilia is noted in left hepatic lobe. 3. There is atrophic partially fatty replaced pancreas. 4. Again noted large ventral hernia midline lower abdominal wall and anterior pelvic wall. The hernia measures 15.5 x 15 x 8 cm. Is containing omental fat small bowel loops and segment of the colon. There is no evidence of acute complication small bowel or colonic obstruction. 5. Few diverticula are noted distal colon. No evidence of acute diverticulitis or colitis. No colonic obstruction. 6. There is a low-density nodule within left adrenal gland measures 1.8 cm probable adenoma. 7. Progression in size of cystic lesion in left posterior pelvis measures 3.7 x 2.9 cm. On the prior exam measures 3.4 x 2.2 cm. Again noted calcifications the patient is status post hysterectomy. Anterior aspect of the lesion. Electronically Signed   By: Lahoma Crocker M.D.   On: 02/15/2016 18:26   Dg Knee Complete 4 Views Left  Result Date: 02/11/2016 CLINICAL DATA:  Pain, chronic EXAM: LEFT KNEE - COMPLETE 4+ VIEW COMPARISON:  None. FINDINGS: Standing frontal, standing lateral,  standing tunnel, and sunrise patellar images were obtained. There is no evident fracture or dislocation. There is a small joint effusion. There is marked narrowing laterally with essentially complete loss disc space laterally. There is moderately severe narrowing of the patellofemoral joint. There is no erosive change. There are foci of vascular calcification anteriorly with questionable chronic calcified thrombus in the left anterior tibial vein. Multiple phleboliths also present. IMPRESSION: Marked osteoarthritic change, most severe laterally but also moderately severe in the patellofemoral joint region. Small joint effusion. No fracture or dislocation. 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 Electronically Signed   By: Lowella Grip III M.D.   On: 02/11/2016 16:15   Dg Knee Complete 4 Views Right  Result Date: 02/11/2016 CLINICAL DATA:  Chronic pain EXAM: RIGHT KNEE - COMPLETE 4+ VIEW COMPARISON:  None. FINDINGS: Standing frontal, standing tunnel, standing lateral, and sunrise patellar images were obtained it. There is no fracture or dislocation. There is a minimal joint effusion. There is moderately severe joint space narrowing medially and in the patellofemoral joint. There is spurring in all compartments. There is chondrocalcinosis. IMPRESSION: Osteoarthritic change, most marked medially and in the patellofemoral joint regions. Calcinosis present, that may be seen with osteoarthritis or calcium pyrophosphate deposition disease. Minimal joint effusion. Electronically Signed   By: Lowella Grip III M.D.   On: 02/11/2016 16:16    Assessment/Plan 1. Type 2 diabetes, uncontrolled, with neuropathy ([place]) -rather than increasing toujeo more (on 20 units), I'm going to add glipizide to her regimen along with tradjenta  -must stay off of metformin due to #2  2. Choledocholithiasis -all values normalized except AP and has a repeat of it coming up   3. Chronic  venous insufficiency -ongoing, is to elevate feet at rest and wearing compression hose, but does not adhere -also  to eat low sodium, low fat diet and says she is doing that now (edema is better than it was)  4. Severe obesity (BMI >= 40) ([facility]) -ongoing, does not exercise, is working on improving her diet -is now requiring insulin since she can't take metformin anymore so weight may go up more from it, unfortunately  Labs/tests ordered:  No orders of the defined types were placed in this encounter.  Next appt:  03/27/2016    L. , D.O. [facility] [address], [place] [address] Cell Phone (Mon-Fri 8am-5pm):  [phone] On Call:  [phone] & follow prompts after 5pm & weekends Office Phone:  [phone] Office Fax:  [phone]

## 2016-03-11 ENCOUNTER — Other Ambulatory Visit: Payer: Self-pay | Admitting: Internal Medicine

## 2016-03-17 ENCOUNTER — Other Ambulatory Visit (INDEPENDENT_AMBULATORY_CARE_PROVIDER_SITE_OTHER): Payer: Commercial Managed Care - HMO

## 2016-03-17 DIAGNOSIS — R7989 Other specified abnormal findings of blood chemistry: Secondary | ICD-10-CM | POA: Diagnosis not present

## 2016-03-17 DIAGNOSIS — R945 Abnormal results of liver function studies: Principal | ICD-10-CM

## 2016-03-17 LAB — HEPATIC FUNCTION PANEL
ALT: 25 U/L (ref 0–35)
AST: 26 U/L (ref 0–37)
Albumin: 3.8 g/dL (ref 3.5–5.2)
Alkaline Phosphatase: 121 U/L — ABNORMAL HIGH (ref 39–117)
BILIRUBIN DIRECT: 0.1 mg/dL (ref 0.0–0.3)
Total Bilirubin: 0.6 mg/dL (ref 0.2–1.2)
Total Protein: 6.7 g/dL (ref 6.0–8.3)

## 2016-03-18 ENCOUNTER — Telehealth: Payer: Self-pay | Admitting: Physician Assistant

## 2016-03-18 NOTE — Telephone Encounter (Signed)
Pt aware that Debra [name] will review on Friday 03/20/16 and we will call with any recommendations at that time.  Pt agreed

## 2016-03-20 ENCOUNTER — Other Ambulatory Visit: Payer: Self-pay | Admitting: Internal Medicine

## 2016-03-20 ENCOUNTER — Other Ambulatory Visit: Payer: Self-pay | Admitting: *Deleted

## 2016-03-20 DIAGNOSIS — R945 Abnormal results of liver function studies: Secondary | ICD-10-CM | POA: Insufficient documentation

## 2016-03-20 DIAGNOSIS — R7989 Other specified abnormal findings of blood chemistry: Secondary | ICD-10-CM | POA: Insufficient documentation

## 2016-03-27 ENCOUNTER — Ambulatory Visit: Payer: Commercial Managed Care - HMO

## 2016-03-27 ENCOUNTER — Ambulatory Visit: Payer: Commercial Managed Care - HMO | Admitting: Internal Medicine

## 2016-04-10 ENCOUNTER — Other Ambulatory Visit: Payer: Self-pay | Admitting: Internal Medicine

## 2016-04-12 ENCOUNTER — Other Ambulatory Visit: Payer: Self-pay | Admitting: Internal Medicine

## 2016-04-13 ENCOUNTER — Encounter: Payer: Self-pay | Admitting: Internal Medicine

## 2016-04-13 ENCOUNTER — Ambulatory Visit (INDEPENDENT_AMBULATORY_CARE_PROVIDER_SITE_OTHER): Payer: Medicare HMO | Admitting: Internal Medicine

## 2016-04-13 ENCOUNTER — Ambulatory Visit (INDEPENDENT_AMBULATORY_CARE_PROVIDER_SITE_OTHER): Payer: Medicare HMO

## 2016-04-13 VITALS — BP 146/80 | HR 81 | Temp 98.7°F | Ht 64.0 in | Wt 263.0 lb

## 2016-04-13 DIAGNOSIS — E1140 Type 2 diabetes mellitus with diabetic neuropathy, unspecified: Secondary | ICD-10-CM

## 2016-04-13 DIAGNOSIS — K7689 Other specified diseases of liver: Secondary | ICD-10-CM

## 2016-04-13 DIAGNOSIS — E11621 Type 2 diabetes mellitus with foot ulcer: Secondary | ICD-10-CM | POA: Diagnosis not present

## 2016-04-13 DIAGNOSIS — E1165 Type 2 diabetes mellitus with hyperglycemia: Secondary | ICD-10-CM | POA: Diagnosis not present

## 2016-04-13 DIAGNOSIS — Z0000 Encounter for general adult medical examination without abnormal findings: Secondary | ICD-10-CM | POA: Diagnosis not present

## 2016-04-13 DIAGNOSIS — L97529 Non-pressure chronic ulcer of other part of left foot with unspecified severity: Secondary | ICD-10-CM

## 2016-04-13 DIAGNOSIS — Z23 Encounter for immunization: Secondary | ICD-10-CM

## 2016-04-13 DIAGNOSIS — I872 Venous insufficiency (chronic) (peripheral): Secondary | ICD-10-CM | POA: Diagnosis not present

## 2016-04-13 DIAGNOSIS — M170 Bilateral primary osteoarthritis of knee: Secondary | ICD-10-CM | POA: Diagnosis not present

## 2016-04-13 DIAGNOSIS — IMO0002 Reserved for concepts with insufficient information to code with codable children: Secondary | ICD-10-CM

## 2016-04-13 DIAGNOSIS — R945 Abnormal results of liver function studies: Secondary | ICD-10-CM
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
## 2016-04-13 NOTE — Patient Instructions (Addendum)
Take two lasix per day for 3 days to help get your fluid out (through Thursday).  Breakfast and lunch.   Weigh tonight and weigh again on Friday morning after you have finished the three days of extra diuretics.

## 2016-04-13 NOTE — Progress Notes (Signed)
   I reviewed health advisor's note, was available for consultation and agree with the assessment and plan as written.  previously refused hep c screening also due to cost up to $90.  Lyrica only expensive b/c she must meet her deductible first.    Rad Gramling L. Nansi Birmingham, D.O. [facility] [address], [place] [address] Cell Phone (Mon-Fri 8am-5pm):  [phone] On Call:  [phone] & follow prompts after 5pm & weekends Office Phone:  [phone] Office Fax:  [phone]   Quick Notes   Health Maintenance:   Pn23 given today; Due for Hep C. Declined to have MMG and colonoscopy done.   Abnormal Screen: None; MMSE-28/30; Passed Clock Test  Patient Concerns:  Pt states she cannot afford her prescription for Lyrica. It costs $86 for half a months supply. Says her insurance won't pay for it.      Nurse Concerns:  None

## 2016-04-13 NOTE — Patient Instructions (Addendum)
Debra [name] , Thank you for taking time to come for your Medicare Wellness Visit. I appreciate your ongoing commitment to your health goals. Please review the following plan we discussed and let me know if I can assist you in the future.   These are the goals we discussed: Goals    . Blood Pressure < 140/90    . HEMOGLOBIN A1C < 7.0    . Increase physical activity          Starting 04/13/2016; I will attempt to increase my physical activity, as much as my knees will allow.     Marland Kitchen LDL CALC < 100       This is a list of the screening recommended for you and due dates:  Health Maintenance  Topic Date Due  .  Hepatitis C: One time screening is recommended by Center for Disease Control  (CDC) for  adults born from [birth year] through 1964.   Dec 08, 1945  . Shingles Vaccine  03/24/2019*  . Hemoglobin A1C  05/12/2016  . Complete foot exam   07/03/2016  . Lipid (cholesterol) test  07/07/2016  . Eye exam for diabetics  11/06/2016  . Tetanus Vaccine  09/10/2020  . Flu Shot  Completed  . DEXA scan (bone density measurement)  Completed  . Pneumonia vaccines  Completed  . Mammogram  Excluded  . Colon Cancer Screening  Excluded  *Topic was postponed. The date shown is not the original due date.  Preventive Care for Adults  A healthy lifestyle and preventive care can promote health and wellness. Preventive health guidelines for adults include the following key practices.  . A routine yearly physical is a good way to check with your health care provider about your health and preventive screening. It is a chance to share any concerns and updates on your health and to receive a thorough exam.  . Visit your dentist for a routine exam and preventive care every 6 months. Brush your teeth twice a day and floss once a day. Good oral hygiene prevents tooth decay and gum disease.  . The frequency of eye exams is based on your age, health, family medical history, use  of contact lenses, and other factors. Follow  your health care provider's ecommendations for frequency of eye exams.  . Eat a healthy diet. Foods like vegetables, fruits, whole grains, low-fat dairy products, and lean protein foods contain the nutrients you need without too many calories. Decrease your intake of foods high in solid fats, added sugars, and salt. Eat the right amount of calories for you. Get information about a proper diet from your health care provider, if necessary.  . Regular physical exercise is one of the most important things you can do for your health. Most adults should get at least 150 minutes of moderate-intensity exercise (any activity that increases your heart rate and causes you to sweat) each week. In addition, most adults need muscle-strengthening exercises on 2 or more days a week.  Silver Sneakers may be a benefit available to you. To determine eligibility, you may visit the website: www.silversneakers.com or contact program at (929)394-6983 Mon-Fri between 8AM-8PM.   . Maintain a healthy weight. The body mass index (BMI) is a screening tool to identify possible weight problems. It provides an estimate of body fat based on height and weight. Your health care provider can find your BMI and can help you achieve or maintain a healthy weight.   For adults 20 years and older: ? A BMI below  18.5 is considered underweight. ? A BMI of 18.5 to 24.9 is normal. ? A BMI of 25 to 29.9 is considered overweight. ? A BMI of 30 and above is considered obese.   . Maintain normal blood lipids and cholesterol levels by exercising and minimizing your intake of saturated fat. Eat a balanced diet with plenty of fruit and vegetables. Blood tests for lipids and cholesterol should begin at age 13 and be repeated every 5 years. If your lipid or cholesterol levels are high, you are over 50, or you are at high risk for heart disease, you may need your cholesterol levels checked more frequently. Ongoing high lipid and cholesterol levels  should be treated with medicines if diet and exercise are not working.  . If you smoke, find out from your health care provider how to quit. If you do not use tobacco, please do not start.  . If you choose to drink alcohol, please do not consume more than 2 drinks per day. One drink is considered to be 12 ounces (355 mL) of beer, 5 ounces (148 mL) of wine, or 1.5 ounces (44 mL) of liquor.  . If you are 75-27 years old, ask your health care provider if you should take aspirin to prevent strokes.  . Use sunscreen. Apply sunscreen liberally and repeatedly throughout the day. You should seek shade when your shadow is shorter than you. Protect yourself by wearing long sleeves, pants, a wide-brimmed hat, and sunglasses year round, whenever you are outdoors.  . Once a month, do a whole body skin exam, using a mirror to look at the skin on your back. Tell your health care provider of new moles, moles that have irregular borders, moles that are larger than a pencil eraser, or moles that have changed in shape or color.

## 2016-04-13 NOTE — Progress Notes (Signed)
Subjective:   Debra [name] is a 71 y.o. female who presents for an Initial Medicare Annual Wellness Visit.  Review of Systems    Cardiac Risk Factors include: advanced age (>24men, >15 women);diabetes mellitus;dyslipidemia;family history of premature cardiovascular disease;sedentary lifestyle;obesity (BMI >30kg/m2);hypertension     Objective:    Today's Vitals   04/13/16 1432  BP: (!) 146/80  Pulse: 81  Temp: 98.7 F (37.1 C)  TempSrc: Oral  SpO2: 98%  Weight: 263 lb (119.3 kg)  Height: 5\' 4"  (1.626 m)   Body mass index is 45.14 kg/m.   Current Medications (verified) Outpatient Encounter Prescriptions as of 04/13/2016  Medication Sig  . atorvastatin (LIPITOR) 40 MG tablet TAKE 1 TABLET BY MOUTH EVERY DAY  . furosemide (LASIX) 20 MG tablet TAKE 1 TABLET(20 MG) BY MOUTH DAILY FOR SWELLING  . glipiZIDE (GLUCOTROL XL) 10 MG 24 hr tablet Take 1 tablet (10 mg total) by mouth daily with breakfast.  . glucose blood (ACCU-CHEK AVIVA PLUS) test strip Ell.65 check blood sugar twice daily as directed  . Insulin Glargine (TOUJEO SOLOSTAR) 300 UNIT/ML SOPN Inject 20 Units into the skin at bedtime.  . Insulin Pen Needle (BD PEN NEEDLE NANO U/F) 32G X 4 MM MISC Use once daily with the administration of Toujeo DX E11.65  . lisinopril (PRINIVIL,ZESTRIL) 40 MG tablet TAKE 1 TABLET BY MOUTH DAILY  . metoprolol succinate (TOPROL-XL) 50 MG 24 hr tablet TAKE 1 TABLET BY MOUTH ONCE DAILY  . omeprazole (PRILOSEC) 20 MG capsule TAKE 1 CAPSULE BY MOUTH DAILY  . potassium chloride SA (K-DUR,KLOR-CON) 20 MEQ tablet TAKE 1 TABLET(20 MEQ) BY MOUTH DAILY  . TRADJENTA 5 MG TABS tablet TAKE 1 TABLET BY MOUTH EVERY DAY  . LYRICA 75 MG capsule TAKE 1 CAPSULE BY MOUTH THREE TIMES DAILY AS NEEDED  . [DISCONTINUED] omeprazole (PRILOSEC) 20 MG capsule TAKE 1 CAPSULE BY MOUTH DAILY   No facility-administered encounter medications on file as of 04/13/2016.     Allergies (verified) Aspirin; Banana;  Hydrocodone; Neomycin-polymyxin-gramicidin; Penicillins; Tramadol; Voltaren [diclofenac sodium]; and Latex   History: Past Medical History:  Diagnosis Date  . Cellulitis of left leg    history of, most recent episode 01/09  . Chronic venous insufficiency   . Diabetes mellitus with neurological manifestation ([place])   . Diverticulitis    h/o 2-3 episodes in past  . Hyperlipidemia LDL goal < 100   . Hypertension goal BP (blood pressure) < 130/80   . Leg edema    secondary to chronic venous insufficiency  . Morbid obesity ([place])   . Osteopenia    DEXA scan 7/07   Past Surgical History:  Procedure Laterality Date  . ABDOMINAL HYSTERECTOMY    . APPENDECTOMY    . CHOLECYSTECTOMY    . CHOLECYSTECTOMY N/A 04/14/2013   Procedure: LAPAROSCOPIC CHOLECYSTECTOMY WITH INTRAOPERATIVE CHOLANGIOGRAM;  Surgeon: Adin Hector, MD;  Location: [facility];  Service: General;  Laterality: N/A;  . COLECTOMY     with diverting colsotmy and revision in the 1990's for diverticulitis  . ERCP N/A 04/17/2013   Procedure: ENDOSCOPIC RETROGRADE CHOLANGIOPANCREATOGRAPHY (ERCP);  Surgeon: Irene Shipper, MD;  Location: [facility];  Service: Endoscopy;  Laterality: N/A;  note pt want general anesthesia for this case.  preferto perform in endo unit, but if unavailable please arrange time in OR  . EYE SURGERY  8/13   left cataract removal & retinal repair  . LAPAROSCOPIC LYSIS OF ADHESIONS N/A 04/14/2013   Procedure: LAPAROSCOPIC LYSIS OF  ADHESIONS;  Surgeon: Adin Hector, MD;  Location: [facility];  Service: General;  Laterality: N/A;   Family History  Problem Relation Age of Onset  . Heart disease Mother   . Heart disease Father   . Heart disease Brother   . Cancer Brother   . Cancer Brother   . Hypertension Sister   . Heart disease Sister    Social History   Occupational History  . Not on file.   Social History Main Topics  . Smoking status: Never Smoker  . Smokeless tobacco: Never Used  . Alcohol use No   . Drug use: No  . Sexual activity: Yes    Tobacco Counseling Counseling given: No   Activities of Daily Living In your present state of health, do you have any difficulty performing the following activities: 04/13/2016  Hearing? N  Vision? N  Difficulty concentrating or making decisions? N  Walking or climbing stairs? Y  Dressing or bathing? N  Doing errands, shopping? N  Preparing Food and eating ? N  Using the Toilet? N  In the past six months, have you accidently leaked urine? N  Do you have problems with loss of bowel control? N  Managing your Medications? N  Managing your Finances? N  Housekeeping or managing your Housekeeping? N  Some recent data might be hidden    Immunizations and Health Maintenance Immunization History  Administered Date(s) Administered  . Influenza Split 01/15/2011, 12/16/2011  . Influenza Whole 01/17/2007, 12/25/2008, 12/24/2009  . Influenza,inj,Quad PF,36+ Mos 11/28/2012, 12/10/2014  . Influenza-Unspecified 01/07/2014, 01/06/2016  . Pneumococcal Conjugate-13 02/22/2014  . Pneumococcal Polysaccharide-23 03/23/2006, 04/13/2016  . Tdap 09/11/2010   Health Maintenance Due  Topic Date Due  . Hepatitis C Screening  [birth year]  . MAMMOGRAM  09/22/2012    Patient Care Team: Gayland Curry, DO as PCP - General (Geriatric Medicine)  Indicate any recent Medical Services you may have received from other than [facility] providers in the past year (date may be approximate).     Assessment:   This is a routine wellness examination for Debra [name].   Hearing/Vision screen Vision Screening Comments: Last [facility] exam done in 2017 [facility]. Pt unable to remember what month it was completed.   Dietary issues and exercise activities discussed: Current Exercise Habits: The patient does not participate in regular exercise at present, Exercise limited by: orthopedic condition(s)  Goals    . Blood Pressure < 140/90    . HEMOGLOBIN A1C < 7.0    . Increase  physical activity          Starting 04/13/2016; I will attempt to increase my physical activity, as much as my knees will allow.     Marland Kitchen LDL CALC < 100      Depression Screen PHQ 2/9 Scores 04/13/2016 03/09/2016 02/27/2016 07/04/2015 10/01/2014 04/04/2013 01/19/2013  PHQ - 2 Score 0 0 0 0 0 0 0    Fall Risk Fall Risk  04/13/2016 03/09/2016 02/27/2016 02/10/2016 07/04/2015  Falls in the past year? No No No No No  Risk for fall due to : - - - - -    Cognitive Function: MMSE - Mini Mental State Exam 04/13/2016  Orientation to time 5  Orientation to Place 5  Registration 3  Attention/ Calculation 4  Recall 3  Language- name 2 objects 2  Language- repeat 1  Language- follow 3 step command 3  Language- read & follow direction 1  Write a sentence 1  Copy  design 1  Total score 29        Screening Tests Health Maintenance  Topic Date Due  . Hepatitis C Screening  1945/11/12  . MAMMOGRAM  09/22/2012  . COLONOSCOPY  07/21/2016 (Originally 10/05/1996)  . ZOSTAVAX  03/24/2019 (Originally 10/06/2006)  . HEMOGLOBIN A1C  05/12/2016  . FOOT EXAM  07/03/2016  . LIPID PANEL  07/07/2016  . OPHTHALMOLOGY EXAM  11/06/2016  . TETANUS/TDAP  09/10/2020  . INFLUENZA VACCINE  Completed  . DEXA SCAN  Completed  . PNA vac Low Risk Adult  Completed      Plan:    I have personally reviewed and addressed the Medicare Annual Wellness questionnaire and have noted the following in the patient's chart:  A. Medical and social history B. Use of alcohol, tobacco or illicit drugs  C. Current medications and supplements D. Functional ability and status E.  Nutritional status F.  Physical activity G. Advance directives H. List of other physicians I.  Hospitalizations, surgeries, and ER visits in previous 12 months J.  [place] to include hearing, vision, cognitive, depression L. Referrals and appointments - none  In addition, I have reviewed and discussed with patient certain preventive  protocols, quality metrics, and best practice recommendations. A written personalized care plan for preventive services as well as general preventive health recommendations were provided to patient.  See attached scanned questionnaire for additional information.   Signed,   Allyn Kenner, LPN Health Advisor

## 2016-05-03 ENCOUNTER — Other Ambulatory Visit: Payer: Self-pay | Admitting: Internal Medicine

## 2016-05-22 ENCOUNTER — Other Ambulatory Visit: Payer: Medicare HMO

## 2016-05-22 DIAGNOSIS — E1165 Type 2 diabetes mellitus with hyperglycemia: Secondary | ICD-10-CM | POA: Diagnosis not present

## 2016-05-22 DIAGNOSIS — R945 Abnormal results of liver function studies: Secondary | ICD-10-CM

## 2016-05-22 DIAGNOSIS — IMO0002 Reserved for concepts with insufficient information to code with codable children: Secondary | ICD-10-CM

## 2016-05-22 DIAGNOSIS — E11621 Type 2 diabetes mellitus with foot ulcer: Secondary | ICD-10-CM | POA: Diagnosis not present

## 2016-05-22 DIAGNOSIS — L97529 Non-pressure chronic ulcer of other part of left foot with unspecified severity: Secondary | ICD-10-CM

## 2016-05-22 DIAGNOSIS — K7689 Other specified diseases of liver: Secondary | ICD-10-CM | POA: Diagnosis not present

## 2016-05-22 DIAGNOSIS — E1140 Type 2 diabetes mellitus with diabetic neuropathy, unspecified: Secondary | ICD-10-CM | POA: Diagnosis not present

## 2016-05-22 LAB — COMPLETE METABOLIC PANEL WITH GFR
ALT: 15 U/L (ref 6–29)
AST: 13 U/L (ref 10–35)
Albumin: 3.6 g/dL (ref 3.6–5.1)
Alkaline Phosphatase: 102 U/L (ref 33–130)
BUN: 17 mg/dL (ref 7–25)
CO2: 26 mmol/L (ref 20–31)
Calcium: 8.8 mg/dL (ref 8.6–10.4)
Chloride: 105 mmol/L (ref 98–110)
Creat: 0.86 mg/dL (ref 0.50–0.99)
GFR, Est African American: 80 mL/min (ref >=60)
GFR, Est Non African American: 69 mL/min (ref >=60)
Glucose, Bld: 145 mg/dL — ABNORMAL HIGH (ref 65–99)
Potassium: 4.3 mmol/L (ref 3.5–5.3)
Sodium: 141 mmol/L (ref 135–146)
Total Bilirubin: 0.4 mg/dL (ref 0.2–1.2)
Total Protein: 6.5 g/dL (ref 6.1–8.1)

## 2016-05-22 LAB — CBC WITH DIFFERENTIAL/PLATELET
Basophils Absolute: 0 cells/uL (ref 0–200)
Basophils Relative: 0 %
Eosinophils Absolute: 280 cells/uL (ref 15–500)
Eosinophils Relative: 4 %
HCT: 41.5 % (ref 35.0–45.0)
Hemoglobin: 13.8 g/dL (ref 11.7–15.5)
Lymphocytes Relative: 36 %
Lymphs Abs: 2520 cells/uL (ref 850–3900)
MCH: 30.3 pg (ref 27.0–33.0)
MCHC: 33.3 g/dL (ref 32.0–36.0)
MCV: 91 fL (ref 80.0–100.0)
MPV: 9.6 fL (ref 7.5–12.5)
Monocytes Absolute: 630 cells/uL (ref 200–950)
Monocytes Relative: 9 %
Neutro Abs: 3570 cells/uL (ref 1500–7800)
Neutrophils Relative %: 51 %
Platelets: 216 10*3/uL (ref 140–400)
RBC: 4.56 MIL/uL (ref 3.80–5.10)
RDW: 14.7 % (ref 11.0–15.0)
WBC: 7 10*3/uL (ref 3.8–10.8)

## 2016-05-23 ENCOUNTER — Other Ambulatory Visit: Payer: Self-pay | Admitting: Internal Medicine

## 2016-05-23 LAB — HEMOGLOBIN A1C
Hgb A1c MFr Bld: 7.6 % — ABNORMAL HIGH (ref <5.7)
Mean Plasma Glucose: 171 mg/dL

## 2016-05-25 ENCOUNTER — Encounter: Payer: Self-pay | Admitting: Internal Medicine

## 2016-05-25 ENCOUNTER — Ambulatory Visit (INDEPENDENT_AMBULATORY_CARE_PROVIDER_SITE_OTHER): Payer: Medicare HMO | Admitting: Internal Medicine

## 2016-05-25 VITALS — BP 130/78 | HR 58 | Temp 97.8°F | Wt 262.0 lb

## 2016-05-25 DIAGNOSIS — L97529 Non-pressure chronic ulcer of other part of left foot with unspecified severity: Secondary | ICD-10-CM | POA: Diagnosis not present

## 2016-05-25 DIAGNOSIS — E11621 Type 2 diabetes mellitus with foot ulcer: Secondary | ICD-10-CM

## 2016-05-25 DIAGNOSIS — E1169 Type 2 diabetes mellitus with other specified complication: Secondary | ICD-10-CM

## 2016-05-25 DIAGNOSIS — E785 Hyperlipidemia, unspecified: Secondary | ICD-10-CM | POA: Diagnosis not present

## 2016-05-25 DIAGNOSIS — Z794 Long term (current) use of insulin: Secondary | ICD-10-CM | POA: Diagnosis not present

## 2016-05-25 DIAGNOSIS — I10 Essential (primary) hypertension: Secondary | ICD-10-CM

## 2016-05-25 DIAGNOSIS — E1142 Type 2 diabetes mellitus with diabetic polyneuropathy: Secondary | ICD-10-CM | POA: Diagnosis not present

## 2016-05-25 DIAGNOSIS — I872 Venous insufficiency (chronic) (peripheral): Secondary | ICD-10-CM

## 2016-05-25 NOTE — Progress Notes (Signed)
Location:  [facility] clinic Provider:  Melissa Pulido L. Mariea Clonts, D.O., C.M.D.  Code Status: full code Goals of Care:  Advanced Directives 05/25/2016  Does Patient Have a Medical Advance Directive? No  Copy of Healthcare Power of Attorney in Chart? -  Would patient like information on creating a medical advance directive? -  Pre-existing out of facility DNR order (yellow form or pink MOST form) -   Chief Complaint  Patient presents with  . Follow-up    6 week follow-up    HPI: Patient is a 71 y.o. female seen today for medical management of chronic diseases.    Has history significant for Type 2 DM, cellulitis of left leg, left great toe wound, leg edema, HTN, hyperlipidemia.   Is concerned she can't lose weight. Today is 262 lb, 255 lb on 12/18. 1 week ago, she lost 10 lbs, but today it is back up. She lost weight b/c she was sick and couldn't eat (had virus with n/d). These symptoms have gone away. Has been trying to cut back on breads, salt, fried foods, and fatty meats. Is trying to grill or bake food. Is unable to exercise, due to arthritis in both knees. Feels she could really lose weight if she could walk good.   Sugars at home are lower than expected in the mornings, 148, in evening is higher than normal 150-170. Takes insulin Toujeo 20 units at night. She does report she takes this daily. Is on tradjenta and glipizide during the day. Glucose 145, A1C 7.6 (up from 7.4). She thinks is likely related to food intake.   Had blister burst to left anterior lower leg x 4 days. Does not remember bumping it on anything, just noticed burning once day, and then...there was a blister. Has been using a cream to stop it from burning. Was trying to keep it covered, but felt like she was pulling her skin, so decided to leave it open to air. Did try to use neosporin, but didn't notice any burning relief. Cellulitis to RLL has healed, no problems, but has been left with a purple discoloration. Still has  swelling to BLE, worse at night. Tries to keep her legs elevated when sitting. Is taking lasix once daily for swelling. Does notice that she is in the bathroom frequently. Wound to left great toe has healed, no longer requiring a dressing. Once leg wounds are healed she plans to wear her support hose again.   Liver panel has now completely normalized.  Past Medical History:  Diagnosis Date  . Cellulitis of left leg    history of, most recent episode 01/09  . Chronic venous insufficiency   . Diabetes mellitus with neurological manifestation ([place])   . Diverticulitis    h/o 2-3 episodes in past  . Hyperlipidemia LDL goal < 100   . Hypertension goal BP (blood pressure) < 130/80   . Leg edema    secondary to chronic venous insufficiency  . Morbid obesity ([place])   . Osteopenia    DEXA scan 7/07    Past Surgical History:  Procedure Laterality Date  . ABDOMINAL HYSTERECTOMY    . APPENDECTOMY    . CHOLECYSTECTOMY    . CHOLECYSTECTOMY N/A 04/14/2013   Procedure: LAPAROSCOPIC CHOLECYSTECTOMY WITH INTRAOPERATIVE CHOLANGIOGRAM;  Surgeon: Adin Hector, MD;  Location: [facility];  Service: General;  Laterality: N/A;  . COLECTOMY     with diverting colsotmy and revision in the 1990's for diverticulitis  . ERCP N/A 04/17/2013  Procedure: ENDOSCOPIC RETROGRADE CHOLANGIOPANCREATOGRAPHY (ERCP);  Surgeon: Irene Shipper, MD;  Location: [facility];  Service: Endoscopy;  Laterality: N/A;  note pt want general anesthesia for this case.  preferto perform in endo unit, but if unavailable please arrange time in OR  . EYE SURGERY  8/13   left cataract removal & retinal repair  . LAPAROSCOPIC LYSIS OF ADHESIONS N/A 04/14/2013   Procedure: LAPAROSCOPIC LYSIS OF ADHESIONS;  Surgeon: Adin Hector, MD;  Location: [facility];  Service: General;  Laterality: N/A;    Allergies  Allergen Reactions  . Aspirin Nausea Only    And causes bruises  . Banana Hives  . Hydrocodone Hives  . Neomycin-Polymyxin-Gramicidin  Itching and Swelling    Eye drops caused swelling in face and rash and itching on arms and neck  . Penicillins Hives and Itching  . Tramadol Nausea And Vomiting  . Voltaren [Diclofenac Sodium]   . Latex Hives and Rash    Allergies as of 05/25/2016      Reactions   Aspirin Nausea Only   And causes bruises   Banana Hives   Hydrocodone Hives   Neomycin-polymyxin-gramicidin Itching, Swelling   Eye drops caused swelling in face and rash and itching on arms and neck   Penicillins Hives, Itching   Tramadol Nausea And Vomiting   Voltaren [diclofenac Sodium]    Latex Hives, Rash      Medication List       Accurate as of 05/25/16 11:09 AM. Always use your most recent med list.          atorvastatin 40 MG tablet Commonly known as:  LIPITOR TAKE 1 TABLET BY MOUTH EVERY DAY   furosemide 20 MG tablet Commonly known as:  LASIX TAKE 1 TABLET(20 MG) BY MOUTH DAILY FOR SWELLING   glipiZIDE 10 MG 24 hr tablet Commonly known as:  GLUCOTROL XL Take 1 tablet (10 mg total) by mouth daily with breakfast.   glucose blood test strip Commonly known as:  ACCU-CHEK AVIVA PLUS Ell.65 check blood sugar twice daily as directed   Insulin Glargine 300 UNIT/ML Sopn Commonly known as:  TOUJEO SOLOSTAR Inject 20 Units into the skin at bedtime.   Insulin Pen Needle 32G X 4 MM Misc Commonly known as:  BD PEN NEEDLE NANO U/F Use once daily with the administration of Toujeo DX E11.65   lisinopril 40 MG tablet Commonly known as:  PRINIVIL,ZESTRIL TAKE 1 TABLET BY MOUTH DAILY   LYRICA 75 MG capsule Generic drug:  pregabalin TAKE 1 CAPSULE BY MOUTH THREE TIMES DAILY AS NEEDED   metoprolol succinate 50 MG 24 hr tablet Commonly known as:  TOPROL-XL TAKE 1 TABLET BY MOUTH ONCE DAILY   omeprazole 20 MG capsule Commonly known as:  PRILOSEC TAKE 1 CAPSULE BY MOUTH DAILY   potassium chloride SA 20 MEQ tablet Commonly known as:  K-DUR,KLOR-CON TAKE 1 TABLET(20 MEQ) BY MOUTH DAILY   TRADJENTA 5 MG  Tabs tablet Generic drug:  linagliptin TAKE 1 TABLET BY MOUTH EVERY DAY       Review of Systems:  Review of Systems  Constitutional:       + weight gain   Respiratory: Negative for cough, shortness of breath and wheezing.   Cardiovascular: Positive for leg swelling. Negative for chest pain, palpitations and orthopnea.       Chronic swelling to BLE  Skin:       Blister to left lower leg x 4 days, burst, no longer draining. Is burning. Open  to air.     Health Maintenance  Topic Date Due  . Hepatitis C Screening  12/30/1945  . FOOT EXAM  07/03/2016  . LIPID PANEL  07/07/2016  . HEMOGLOBIN A1C  08/22/2016  . OPHTHALMOLOGY EXAM  11/06/2016  . TETANUS/TDAP  09/10/2020  . INFLUENZA VACCINE  Completed  . DEXA SCAN  Completed  . PNA vac Low Risk Adult  Completed  . MAMMOGRAM  Excluded  . COLONOSCOPY  Excluded    Physical Exam: Vitals:   05/25/16 1057  BP: 130/78  Pulse: (!) 58  Temp: 97.8 F (36.6 C)  TempSrc: Oral  SpO2: 98%  Weight: 262 lb (118.8 kg)   Body mass index is 44.97 kg/m. Physical Exam  Constitutional: She is oriented to person, place, and time. She appears well-developed and well-nourished. No distress.  Cardiovascular: Normal rate, regular rhythm and normal heart sounds.   No murmur heard. Diminished peripheral pulses  Pulmonary/Chest: Effort normal and breath sounds normal. No respiratory distress. She has no wheezes. She has no rales.  Neurological: She is alert and oriented to person, place, and time.  Skin: Skin is warm. Capillary refill takes more than 3 seconds.  Diminished monofilament to BLE, worse on left than right. Chronic venous insufficiency to BLE. Superficial blister to LLE, has burst. 2+ pitting edema BLE. Extremities appropriately warm to touch.    Labs reviewed: Basic Metabolic Panel:  Recent Labs  02/15/16 1501 02/28/16 1420 05/22/16 0903  NA 140 139 141  K 3.8 4.7 4.3  CL 103 103 105  CO2 26 31 26   GLUCOSE 207* 339* 145*    BUN 24* 20 17  CREATININE 0.81 0.79 0.86  CALCIUM 9.0 9.5 8.8   Liver Function Tests:  Recent Labs  02/28/16 1420 03/17/16 1016 05/22/16 0903  AST 21 26 13   ALT 33 25 15  ALKPHOS 148* 121* 102  BILITOT 0.6 0.6 0.4  PROT 7.3 6.7 6.5  ALBUMIN 3.8 3.8 3.6    Recent Labs  02/15/16 1501 02/17/16 0845 02/28/16 1420  LIPASE 33 39 50.0  AMYLASE  --  19  --    No results for input(s): AMMONIA in the last 8760 hours. CBC:  Recent Labs  07/08/15 1023 02/15/16 1501 02/28/16 1420 05/22/16 0903  WBC 6.2 3.8* 10.1 7.0  NEUTROABS 3.0  --  6.1 3,570  HGB  --  14.0 13.7 13.8  HCT 41.6 42.2 40.8 41.5  MCV 91 91.3 89.4 91.0  PLT 268 211 342.0 216   Lipid Panel:  Recent Labs  07/08/15 1023  CHOL 151  HDL 41  LDLCALC 68  TRIG 209*  CHOLHDL 3.7   Lab Results  Component Value Date   HGBA1C 7.6 (H) 05/22/2016    Assessment/Plan 1. Chronic venous insufficiency - she has not been compliant with her compression hose and was reminded to resume these when the ruptured blister on her left shin heals and elevated her feet at rest in the meantime, avoid high sodium foods - COMPLETE METABOLIC PANEL WITH GFR; Future  2. Hypertension goal BP (blood pressure) < 130/80 - bp at goal today, cont current regimen, low sodium diet - COMPLETE METABOLIC PANEL WITH GFR; Future  3. Type 2 diabetes mellitus with diabetic polyneuropathy, with long-term current use of insulin ([facility]) - cont toujeo 20 units qhs, glipizide (no lows), tradjenta, lisinopril, lyrica, statin - Hemoglobin A1c; Future  4. Hyperlipidemia associated with type 2 diabetes mellitus ([place]) - cont statin therapy, improved, monitor, keep working on diet -  Lipid panel; Future  5. Diabetic ulcer of toe of left foot associated with type 2 diabetes mellitus, unspecified ulcer stage ([place]) - actually looking better (no longer draining) and still refuses to see [place] - Hemoglobin A1c; Future  6. Morbid obesity due to excess  calories ([place]) -has gained a few lbs again so reviewed dietary changes  Labs/tests ordered:   Orders Placed This Encounter  Procedures  . Hemoglobin A1c    Standing Status:   Future    Standing Expiration Date:   11/25/2016  . Lipid panel    Standing Status:   Future    Standing Expiration Date:   11/25/2016    Order Specific Question:   Has the patient fasted?    Answer:   Yes  . COMPLETE METABOLIC PANEL WITH GFR    [facility]    Standing Status:   Future    Standing Expiration Date:   11/25/2016   Next appt:  08/24/2016  Med mgt, labs before    Aren Pryde L. Timber Lucarelli, D.O. [facility] [address], [place] [address] Cell Phone (Mon-Fri 8am-5pm):  [phone] On Call:  [phone] & follow prompts after 5pm & weekends Office Phone:  [phone] Office Fax:  [phone]

## 2016-05-27 ENCOUNTER — Other Ambulatory Visit: Payer: Self-pay | Admitting: Internal Medicine

## 2016-05-27 DIAGNOSIS — E1140 Type 2 diabetes mellitus with diabetic neuropathy, unspecified: Secondary | ICD-10-CM

## 2016-05-27 DIAGNOSIS — E1165 Type 2 diabetes mellitus with hyperglycemia: Principal | ICD-10-CM

## 2016-05-27 DIAGNOSIS — IMO0002 Reserved for concepts with insufficient information to code with codable children: Secondary | ICD-10-CM

## 2016-06-02 ENCOUNTER — Other Ambulatory Visit: Payer: Self-pay | Admitting: Internal Medicine

## 2016-06-03 ENCOUNTER — Other Ambulatory Visit: Payer: Self-pay | Admitting: Internal Medicine

## 2016-06-05 ENCOUNTER — Other Ambulatory Visit: Payer: Self-pay | Admitting: Internal Medicine

## 2016-06-05 DIAGNOSIS — I10 Essential (primary) hypertension: Secondary | ICD-10-CM

## 2016-06-11 ENCOUNTER — Telehealth: Payer: Self-pay

## 2016-06-11 NOTE — Telephone Encounter (Signed)
-----   Message from Hulan Saas, RN sent at 03/20/2016 10:22 AM EST ----- Patient needs 3 month LFT drawn per Jennifer(06/18/16). Lab in EPIC.

## 2016-06-11 NOTE — Telephone Encounter (Signed)
Letter mailed to the pt for lab work

## 2016-06-18 ENCOUNTER — Other Ambulatory Visit (INDEPENDENT_AMBULATORY_CARE_PROVIDER_SITE_OTHER): Payer: Commercial Managed Care - HMO

## 2016-06-18 DIAGNOSIS — R7989 Other specified abnormal findings of blood chemistry: Secondary | ICD-10-CM

## 2016-06-18 DIAGNOSIS — R945 Abnormal results of liver function studies: Secondary | ICD-10-CM

## 2016-06-18 LAB — HEPATIC FUNCTION PANEL
ALBUMIN: 3.7 g/dL (ref 3.5–5.2)
ALT: 13 U/L (ref 0–35)
AST: 15 U/L (ref 0–37)
Alkaline Phosphatase: 115 U/L (ref 39–117)
Bilirubin, Direct: 0.2 mg/dL (ref 0.0–0.3)
TOTAL PROTEIN: 6.9 g/dL (ref 6.0–8.3)
Total Bilirubin: 0.6 mg/dL (ref 0.2–1.2)

## 2016-06-28 ENCOUNTER — Other Ambulatory Visit: Payer: Self-pay | Admitting: Internal Medicine

## 2016-07-10 ENCOUNTER — Other Ambulatory Visit: Payer: Self-pay | Admitting: Internal Medicine

## 2016-07-16 ENCOUNTER — Other Ambulatory Visit: Payer: Self-pay

## 2016-07-16 DIAGNOSIS — E785 Hyperlipidemia, unspecified: Secondary | ICD-10-CM

## 2016-07-16 DIAGNOSIS — E1142 Type 2 diabetes mellitus with diabetic polyneuropathy: Secondary | ICD-10-CM

## 2016-07-16 DIAGNOSIS — I872 Venous insufficiency (chronic) (peripheral): Secondary | ICD-10-CM

## 2016-07-16 DIAGNOSIS — E11621 Type 2 diabetes mellitus with foot ulcer: Secondary | ICD-10-CM

## 2016-07-16 DIAGNOSIS — E1169 Type 2 diabetes mellitus with other specified complication: Secondary | ICD-10-CM

## 2016-07-16 DIAGNOSIS — Z794 Long term (current) use of insulin: Secondary | ICD-10-CM

## 2016-07-16 DIAGNOSIS — I10 Essential (primary) hypertension: Secondary | ICD-10-CM

## 2016-07-16 DIAGNOSIS — L97529 Non-pressure chronic ulcer of other part of left foot with unspecified severity: Principal | ICD-10-CM

## 2016-07-17 ENCOUNTER — Other Ambulatory Visit: Payer: Self-pay | Admitting: Internal Medicine

## 2016-07-17 DIAGNOSIS — E1140 Type 2 diabetes mellitus with diabetic neuropathy, unspecified: Secondary | ICD-10-CM

## 2016-07-17 DIAGNOSIS — E1165 Type 2 diabetes mellitus with hyperglycemia: Principal | ICD-10-CM

## 2016-07-17 DIAGNOSIS — IMO0002 Reserved for concepts with insufficient information to code with codable children: Secondary | ICD-10-CM

## 2016-07-30 ENCOUNTER — Other Ambulatory Visit: Payer: Self-pay | Admitting: Internal Medicine

## 2016-08-20 ENCOUNTER — Other Ambulatory Visit: Payer: Medicare HMO

## 2016-08-24 ENCOUNTER — Ambulatory Visit: Payer: Medicare HMO | Admitting: Internal Medicine

## 2016-08-24 ENCOUNTER — Encounter: Payer: Self-pay | Admitting: Internal Medicine

## 2016-08-31 ENCOUNTER — Other Ambulatory Visit: Payer: Self-pay | Admitting: Internal Medicine

## 2016-10-09 ENCOUNTER — Other Ambulatory Visit: Payer: Self-pay | Admitting: Internal Medicine

## 2016-10-12 ENCOUNTER — Other Ambulatory Visit: Payer: Self-pay | Admitting: Internal Medicine

## 2016-10-27 DIAGNOSIS — E1142 Type 2 diabetes mellitus with diabetic polyneuropathy: Secondary | ICD-10-CM | POA: Diagnosis not present

## 2016-10-27 DIAGNOSIS — I10 Essential (primary) hypertension: Secondary | ICD-10-CM | POA: Diagnosis not present

## 2016-10-27 DIAGNOSIS — K219 Gastro-esophageal reflux disease without esophagitis: Secondary | ICD-10-CM | POA: Diagnosis not present

## 2016-10-27 DIAGNOSIS — E1169 Type 2 diabetes mellitus with other specified complication: Secondary | ICD-10-CM | POA: Diagnosis not present

## 2016-10-27 DIAGNOSIS — Z6841 Body Mass Index (BMI) 40.0 and over, adult: Secondary | ICD-10-CM | POA: Diagnosis not present

## 2016-10-27 DIAGNOSIS — Z794 Long term (current) use of insulin: Secondary | ICD-10-CM | POA: Diagnosis not present

## 2016-10-27 DIAGNOSIS — R69 Illness, unspecified: Secondary | ICD-10-CM | POA: Diagnosis not present

## 2016-10-27 DIAGNOSIS — E785 Hyperlipidemia, unspecified: Secondary | ICD-10-CM | POA: Diagnosis not present

## 2016-10-27 DIAGNOSIS — E118 Type 2 diabetes mellitus with unspecified complications: Secondary | ICD-10-CM | POA: Diagnosis not present

## 2016-10-27 DIAGNOSIS — I878 Other specified disorders of veins: Secondary | ICD-10-CM | POA: Diagnosis not present

## 2016-10-29 ENCOUNTER — Other Ambulatory Visit: Payer: Self-pay | Admitting: Internal Medicine

## 2016-11-25 DIAGNOSIS — E118 Type 2 diabetes mellitus with unspecified complications: Secondary | ICD-10-CM | POA: Diagnosis not present

## 2016-11-25 DIAGNOSIS — Z794 Long term (current) use of insulin: Secondary | ICD-10-CM | POA: Diagnosis not present

## 2016-11-26 DIAGNOSIS — E118 Type 2 diabetes mellitus with unspecified complications: Secondary | ICD-10-CM | POA: Diagnosis not present

## 2016-11-26 DIAGNOSIS — E1169 Type 2 diabetes mellitus with other specified complication: Secondary | ICD-10-CM | POA: Diagnosis not present

## 2016-11-26 DIAGNOSIS — E785 Hyperlipidemia, unspecified: Secondary | ICD-10-CM | POA: Diagnosis not present

## 2016-11-26 DIAGNOSIS — Z794 Long term (current) use of insulin: Secondary | ICD-10-CM | POA: Diagnosis not present

## 2016-11-26 DIAGNOSIS — I10 Essential (primary) hypertension: Secondary | ICD-10-CM | POA: Diagnosis not present

## 2016-12-02 DIAGNOSIS — E118 Type 2 diabetes mellitus with unspecified complications: Secondary | ICD-10-CM | POA: Diagnosis not present

## 2016-12-02 DIAGNOSIS — Z794 Long term (current) use of insulin: Secondary | ICD-10-CM | POA: Diagnosis not present

## 2016-12-25 ENCOUNTER — Other Ambulatory Visit: Payer: Self-pay | Admitting: Internal Medicine

## 2016-12-26 DIAGNOSIS — R69 Illness, unspecified: Secondary | ICD-10-CM | POA: Diagnosis not present

## 2017-01-05 ENCOUNTER — Other Ambulatory Visit: Payer: Self-pay | Admitting: Internal Medicine

## 2017-01-15 DIAGNOSIS — R69 Illness, unspecified: Secondary | ICD-10-CM | POA: Diagnosis not present

## 2017-01-27 DIAGNOSIS — Z794 Long term (current) use of insulin: Secondary | ICD-10-CM | POA: Diagnosis not present

## 2017-01-27 DIAGNOSIS — E1169 Type 2 diabetes mellitus with other specified complication: Secondary | ICD-10-CM | POA: Diagnosis not present

## 2017-01-27 DIAGNOSIS — Z1159 Encounter for screening for other viral diseases: Secondary | ICD-10-CM | POA: Diagnosis not present

## 2017-01-27 DIAGNOSIS — I10 Essential (primary) hypertension: Secondary | ICD-10-CM | POA: Diagnosis not present

## 2017-01-27 DIAGNOSIS — R945 Abnormal results of liver function studies: Secondary | ICD-10-CM | POA: Diagnosis not present

## 2017-01-27 DIAGNOSIS — L97521 Non-pressure chronic ulcer of other part of left foot limited to breakdown of skin: Secondary | ICD-10-CM | POA: Diagnosis not present

## 2017-01-27 DIAGNOSIS — E785 Hyperlipidemia, unspecified: Secondary | ICD-10-CM | POA: Diagnosis not present

## 2017-01-27 DIAGNOSIS — L0390 Cellulitis, unspecified: Secondary | ICD-10-CM | POA: Diagnosis not present

## 2017-01-27 DIAGNOSIS — E1142 Type 2 diabetes mellitus with diabetic polyneuropathy: Secondary | ICD-10-CM | POA: Diagnosis not present

## 2017-01-27 DIAGNOSIS — E118 Type 2 diabetes mellitus with unspecified complications: Secondary | ICD-10-CM | POA: Diagnosis not present

## 2017-03-08 ENCOUNTER — Other Ambulatory Visit: Payer: Self-pay | Admitting: Internal Medicine

## 2017-03-08 NOTE — Telephone Encounter (Signed)
PATIENT NEEDS TO BE SEEN FOR FURTHER REFILLS

## 2017-04-02 ENCOUNTER — Telehealth: Payer: Self-pay | Admitting: Internal Medicine

## 2017-04-02 NOTE — Telephone Encounter (Signed)
I attempted to call the patient to ask if she still planned on seeing Dr. Mariea Clonts as her PCP and to schedule an AWV.  Someone answered and told me that I had the wrong number. VDM (DD)

## 2017-04-09 ENCOUNTER — Encounter (HOSPITAL_BASED_OUTPATIENT_CLINIC_OR_DEPARTMENT_OTHER): Payer: Commercial Managed Care - HMO | Attending: Internal Medicine

## 2017-04-16 ENCOUNTER — Other Ambulatory Visit: Payer: Self-pay | Admitting: Internal Medicine

## 2017-04-21 ENCOUNTER — Other Ambulatory Visit: Payer: Self-pay | Admitting: Internal Medicine

## 2017-04-21 DIAGNOSIS — I10 Essential (primary) hypertension: Secondary | ICD-10-CM

## 2017-06-29 ENCOUNTER — Other Ambulatory Visit: Payer: Self-pay | Admitting: Nurse Practitioner

## 2017-07-21 ENCOUNTER — Encounter: Payer: Self-pay | Admitting: Internal Medicine

## 2017-07-22 ENCOUNTER — Emergency Department (HOSPITAL_COMMUNITY): Payer: Medicare HMO

## 2017-07-22 ENCOUNTER — Inpatient Hospital Stay (HOSPITAL_COMMUNITY): Payer: Medicare HMO

## 2017-07-22 ENCOUNTER — Inpatient Hospital Stay (HOSPITAL_COMMUNITY)
Admission: EM | Admit: 2017-07-22 | Discharge: 2017-07-25 | DRG: 394 | Disposition: A | Discharge status: Home / Self Care | Payer: Medicare HMO | Attending: Internal Medicine | Admitting: Internal Medicine

## 2017-07-22 ENCOUNTER — Other Ambulatory Visit: Payer: Self-pay

## 2017-07-22 ENCOUNTER — Encounter (HOSPITAL_COMMUNITY): Payer: Self-pay | Admitting: Emergency Medicine

## 2017-07-22 DIAGNOSIS — Z91040 Latex allergy status: Secondary | ICD-10-CM | POA: Diagnosis not present

## 2017-07-22 DIAGNOSIS — E1140 Type 2 diabetes mellitus with diabetic neuropathy, unspecified: Secondary | ICD-10-CM

## 2017-07-22 DIAGNOSIS — E1142 Type 2 diabetes mellitus with diabetic polyneuropathy: Secondary | ICD-10-CM | POA: Diagnosis present

## 2017-07-22 DIAGNOSIS — K56601 Complete intestinal obstruction, unspecified as to cause: Secondary | ICD-10-CM

## 2017-07-22 DIAGNOSIS — K56609 Unspecified intestinal obstruction, unspecified as to partial versus complete obstruction: Secondary | ICD-10-CM | POA: Diagnosis not present

## 2017-07-22 DIAGNOSIS — Z880 Allergy status to penicillin: Secondary | ICD-10-CM

## 2017-07-22 DIAGNOSIS — E1165 Type 2 diabetes mellitus with hyperglycemia: Secondary | ICD-10-CM | POA: Diagnosis not present

## 2017-07-22 DIAGNOSIS — Z91018 Allergy to other foods: Secondary | ICD-10-CM | POA: Diagnosis not present

## 2017-07-22 DIAGNOSIS — I10 Essential (primary) hypertension: Secondary | ICD-10-CM | POA: Diagnosis present

## 2017-07-22 DIAGNOSIS — Z6841 Body Mass Index (BMI) 40.0 and over, adult: Secondary | ICD-10-CM | POA: Diagnosis not present

## 2017-07-22 DIAGNOSIS — Z881 Allergy status to other antibiotic agents status: Secondary | ICD-10-CM

## 2017-07-22 DIAGNOSIS — Z794 Long term (current) use of insulin: Secondary | ICD-10-CM | POA: Diagnosis not present

## 2017-07-22 DIAGNOSIS — E876 Hypokalemia: Secondary | ICD-10-CM | POA: Diagnosis present

## 2017-07-22 DIAGNOSIS — E785 Hyperlipidemia, unspecified: Secondary | ICD-10-CM | POA: Diagnosis present

## 2017-07-22 DIAGNOSIS — E118 Type 2 diabetes mellitus with unspecified complications: Secondary | ICD-10-CM | POA: Diagnosis present

## 2017-07-22 DIAGNOSIS — Z79899 Other long term (current) drug therapy: Secondary | ICD-10-CM

## 2017-07-22 DIAGNOSIS — Z0189 Encounter for other specified special examinations: Secondary | ICD-10-CM

## 2017-07-22 DIAGNOSIS — Z886 Allergy status to analgesic agent status: Secondary | ICD-10-CM

## 2017-07-22 DIAGNOSIS — K436 Other and unspecified ventral hernia with obstruction, without gangrene: Secondary | ICD-10-CM | POA: Diagnosis not present

## 2017-07-22 DIAGNOSIS — K219 Gastro-esophageal reflux disease without esophagitis: Secondary | ICD-10-CM | POA: Diagnosis present

## 2017-07-22 DIAGNOSIS — Z888 Allergy status to other drugs, medicaments and biological substances status: Secondary | ICD-10-CM | POA: Diagnosis not present

## 2017-07-22 DIAGNOSIS — N179 Acute kidney failure, unspecified: Secondary | ICD-10-CM | POA: Diagnosis present

## 2017-07-22 DIAGNOSIS — M8580 Other specified disorders of bone density and structure, unspecified site: Secondary | ICD-10-CM | POA: Diagnosis present

## 2017-07-22 DIAGNOSIS — Z9049 Acquired absence of other specified parts of digestive tract: Secondary | ICD-10-CM

## 2017-07-22 DIAGNOSIS — K460 Unspecified abdominal hernia with obstruction, without gangrene: Secondary | ICD-10-CM

## 2017-07-22 LAB — COMPREHENSIVE METABOLIC PANEL
ALBUMIN: 4 g/dL (ref 3.5–5.0)
ALK PHOS: 87 U/L (ref 38–126)
ALT: 20 U/L (ref 14–54)
ANION GAP: 14 (ref 5–15)
AST: 24 U/L (ref 15–41)
BILIRUBIN TOTAL: 1.2 mg/dL (ref 0.3–1.2)
BUN: 25 mg/dL — ABNORMAL HIGH (ref 6–20)
CALCIUM: 9.1 mg/dL (ref 8.9–10.3)
CO2: 24 mmol/L (ref 22–32)
CREATININE: 1.29 mg/dL — AB (ref 0.44–1.00)
Chloride: 102 mmol/L (ref 101–111)
GFR calc Af Amer: 47 mL/min — ABNORMAL LOW (ref >60)
GFR calc non Af Amer: 41 mL/min — ABNORMAL LOW (ref >60)
Glucose, Bld: 377 mg/dL — ABNORMAL HIGH (ref 65–99)
Potassium: 4.6 mmol/L (ref 3.5–5.1)
Sodium: 140 mmol/L (ref 135–145)
TOTAL PROTEIN: 7.6 g/dL (ref 6.5–8.1)

## 2017-07-22 LAB — CBC
HCT: 45.3 % (ref 36.0–46.0)
HEMATOCRIT: 47.3 % — AB (ref 36.0–46.0)
HEMOGLOBIN: 15.8 g/dL — AB (ref 12.0–15.0)
Hemoglobin: 15.5 g/dL — ABNORMAL HIGH (ref 12.0–15.0)
MCH: 31 pg (ref 26.0–34.0)
MCH: 30.1 pg (ref 26.0–34.0)
MCHC: 34.2 g/dL (ref 30.0–36.0)
MCHC: 33.4 g/dL (ref 30.0–36.0)
MCV: 90.6 fL (ref 78.0–100.0)
MCV: 90.1 fL (ref 78.0–100.0)
PLATELETS: 287 10*3/uL (ref 150–400)
Platelets: 279 10*3/uL (ref 150–400)
RBC: 5 MIL/uL (ref 3.87–5.11)
RBC: 5.25 MIL/uL — AB (ref 3.87–5.11)
RDW: 13.9 % (ref 11.5–15.5)
RDW: 13.9 % (ref 11.5–15.5)
WBC: 11.5 10*3/uL — ABNORMAL HIGH (ref 4.0–10.5)
WBC: 9.8 10*3/uL (ref 4.0–10.5)

## 2017-07-22 LAB — URINALYSIS, ROUTINE W REFLEX MICROSCOPIC
Bacteria, UA: NONE SEEN
Bilirubin Urine: NEGATIVE
Hgb urine dipstick: NEGATIVE
KETONES UR: 5 mg/dL — AB
Leukocytes, UA: NEGATIVE
NITRITE: NEGATIVE
PH: 5 (ref 5.0–8.0)
Protein, ur: 100 mg/dL — AB
SPECIFIC GRAVITY, URINE: 1.028 (ref 1.005–1.030)

## 2017-07-22 LAB — LIPASE, BLOOD: Lipase: 23 U/L (ref 11–51)

## 2017-07-22 LAB — GLUCOSE, CAPILLARY
GLUCOSE-CAPILLARY: 362 mg/dL — AB (ref 65–99)
Glucose-Capillary: 172 mg/dL — ABNORMAL HIGH (ref 65–99)

## 2017-07-22 LAB — CREATININE, SERUM
CREATININE: 1.12 mg/dL — AB (ref 0.44–1.00)
GFR calc Af Amer: 56 mL/min — ABNORMAL LOW (ref >60)
GFR calc non Af Amer: 49 mL/min — ABNORMAL LOW (ref >60)

## 2017-07-22 LAB — CBG MONITORING, ED: GLUCOSE-CAPILLARY: 346 mg/dL — AB (ref 65–99)

## 2017-07-22 MED ORDER — INSULIN ASPART 100 UNIT/ML ~~LOC~~ SOLN
0.0000 [IU] | SUBCUTANEOUS | Status: DC
  Administered 2017-07-22: 11 [IU] via SUBCUTANEOUS
  Administered 2017-07-22: 15 [IU] via SUBCUTANEOUS
  Administered 2017-07-23: 3 [IU] via SUBCUTANEOUS
  Administered 2017-07-23: 5 [IU] via SUBCUTANEOUS
  Administered 2017-07-23: 2 [IU] via SUBCUTANEOUS
  Administered 2017-07-23: 3 [IU] via SUBCUTANEOUS
  Administered 2017-07-23: 5 [IU] via SUBCUTANEOUS
  Administered 2017-07-23: 2 [IU] via SUBCUTANEOUS
  Administered 2017-07-24: 8 [IU] via SUBCUTANEOUS
  Administered 2017-07-24: 3 [IU] via SUBCUTANEOUS
  Administered 2017-07-24: 2 [IU] via SUBCUTANEOUS
  Administered 2017-07-24: 8 [IU] via SUBCUTANEOUS
  Administered 2017-07-24: 3 [IU] via SUBCUTANEOUS
  Filled 2017-07-22: qty 1

## 2017-07-22 MED ORDER — SODIUM CHLORIDE 0.9 % IV SOLN
INTRAVENOUS | Status: DC
  Administered 2017-07-22: 21:00:00 via INTRAVENOUS
  Administered 2017-07-22: 75 mL/h via INTRAVENOUS
  Administered 2017-07-23 – 2017-07-24 (×2): via INTRAVENOUS

## 2017-07-22 MED ORDER — MORPHINE SULFATE (PF) 2 MG/ML IV SOLN
2.0000 mg | INTRAVENOUS | Status: DC | PRN
Start: 2017-07-22 — End: 2017-07-25
  Administered 2017-07-22 – 2017-07-24 (×2): 2 mg via INTRAVENOUS
  Filled 2017-07-22 (×2): qty 1

## 2017-07-22 MED ORDER — METOPROLOL TARTRATE 5 MG/5ML IV SOLN
2.5000 mg | Freq: Four times a day (QID) | INTRAVENOUS | Status: DC
  Administered 2017-07-22 – 2017-07-25 (×11): 2.5 mg via INTRAVENOUS
  Filled 2017-07-22 (×9): qty 5

## 2017-07-22 MED ORDER — HEPARIN SODIUM (PORCINE) 5000 UNIT/ML IJ SOLN
5000.0000 [IU] | Freq: Three times a day (TID) | INTRAMUSCULAR | Status: DC
  Administered 2017-07-23 – 2017-07-25 (×6): 5000 [IU] via SUBCUTANEOUS
  Filled 2017-07-22 (×8): qty 1

## 2017-07-22 MED ORDER — IOPAMIDOL (ISOVUE-300) INJECTION 61%
INTRAVENOUS | Status: AC
  Filled 2017-07-22: qty 100

## 2017-07-22 MED ORDER — ONDANSETRON HCL 4 MG/2ML IJ SOLN: 4.0000 mg | Freq: Four times a day (QID) | INTRAMUSCULAR | Status: DC | PRN

## 2017-07-22 MED ORDER — IOPAMIDOL (ISOVUE-300) INJECTION 61%
100.0000 mL | Freq: Once | INTRAVENOUS | Status: AC | PRN
  Administered 2017-07-22: 80 mL via INTRAVENOUS

## 2017-07-22 NOTE — ED Provider Notes (Signed)
[facility] DEPT Provider Note   CSN: [id number] Arrival date & time: 07/22/17  1303     History   Chief Complaint Chief Complaint  Patient presents with  . Abdominal Pain    HPI Debra [name] is a 72 y.o. female.  The history is provided by the patient. A language interpreter was used.  Abdominal Pain   This is a new problem. The current episode started 2 days ago. The problem occurs constantly. The problem has been gradually worsening. The pain is located in the generalized abdominal region. The quality of the pain is aching. The pain is moderate. Nothing aggravates the symptoms. Nothing relieves the symptoms. Past workup does not include GI consult.  Pt reports she began vomiting yesterday.  Pt reports increasing abdominal pain.  Pt reports 2 episodes of small amount of diarrhea.  Pt saw her primary care MD and was sent here today for evaluation.  Past Medical History:  Diagnosis Date  . Cellulitis of left leg    history of, most recent episode 01/09  . Chronic venous insufficiency   . Diabetes mellitus with neurological manifestation ([place])   . Diverticulitis    h/o 2-3 episodes in past  . Hyperlipidemia LDL goal < 100   . Hypertension goal BP (blood pressure) < 130/80   . Leg edema    secondary to chronic venous insufficiency  . Morbid obesity ([address])   . Osteopenia    DEXA scan 7/07    Patient Active Problem List   Diagnosis Date Noted  . Abnormal LFTs 03/20/2016  . Abdominal pain, left lower quadrant 07/04/2015  . Type 2 diabetes mellitus with diabetic polyneuropathy ([place]) 10/06/2013  . Hyperlipidemia associated with type 2 diabetes mellitus ([place]) 10/06/2013  . Fungal dermatitis 08/11/2013  . Hyperlipidemia LDL goal < 100 06/09/2013  . Choledocholithiasis with acute cholecystitis 04/14/2013  . Symptomatic cholelithiasis 04/13/2013  . Neuropathic pain of both legs 04/04/2013  . Severe obesity (BMI >= 40) ([place]) 11/28/2012  . Obesity  (BMI 30-39.9) 11/28/2012  . Diabetes mellitus with neurological manifestation ([place])   . Hypertension goal BP (blood pressure) < 130/80   . Osteopenia   . Chronic venous insufficiency   . Leg edema   . Hypertensive retinopathy, grade 2 03/30/2012  . Medial epicondylitis 02/11/2012  . Superficial thrombophlebitis of left leg 07/15/2011  . GERD (gastroesophageal reflux disease) 02/25/2011  . Right knee pain 02/16/2011  . Preventive measure 09/11/2010  . Neck pain 07/03/2010  . OSTEOPENIA 04/12/2006  . Type 2 diabetes, uncontrolled, with neuropathy ([place]) 03/08/2006  . MORBID OBESITY 03/08/2006  . HYPERTENSION 03/08/2006    Past Surgical History:  Procedure Laterality Date  . ABDOMINAL HYSTERECTOMY    . APPENDECTOMY    . CHOLECYSTECTOMY    . CHOLECYSTECTOMY N/A 04/14/2013   Procedure: LAPAROSCOPIC CHOLECYSTECTOMY WITH INTRAOPERATIVE CHOLANGIOGRAM;  Surgeon: Adin Hector, MD;  Location: [facility];  Service: General;  Laterality: N/A;  . COLECTOMY     with diverting colsotmy and revision in the 1990's for diverticulitis  . ERCP N/A 04/17/2013   Procedure: ENDOSCOPIC RETROGRADE CHOLANGIOPANCREATOGRAPHY (ERCP);  Surgeon: Irene Shipper, MD;  Location: [facility];  Service: Endoscopy;  Laterality: N/A;  note pt want general anesthesia for this case.  preferto perform in endo unit, but if unavailable please arrange time in OR  . EYE SURGERY  8/13   left cataract removal & retinal repair  . LAPAROSCOPIC LYSIS OF ADHESIONS N/A 04/14/2013   Procedure: LAPAROSCOPIC LYSIS  OF ADHESIONS;  Surgeon: Adin Hector, MD;  Location: [facility];  Service: General;  Laterality: N/A;     OB History   None      Home Medications    Prior to Admission medications   Medication Sig Start Date End Date Taking? Authorizing Provider  atorvastatin (LIPITOR) 40 MG tablet TAKE 1 TABLET BY MOUTH EVERY DAY Patient taking differently: TAKE 1 TABLET BY MOUTH at bedtime 01/05/17  Yes Lauree Chandler, NP    celecoxib (CELEBREX) 200 MG capsule Take 200 mg by mouth 2 (two) times daily. 04/20/17  Yes [provider]  diphenhydrAMINE (BENADRYL) 25 MG tablet Take 25 mg by mouth at bedtime as needed for itching.   Yes [provider]  furosemide (LASIX) 20 MG tablet TAKE 1 TABLET(20 MG) BY MOUTH DAILY FOR SWELLING 08/31/16  Yes Reed, Tiffany L, DO  insulin NPH Human (HUMULIN N,NOVOLIN N) 100 UNIT/ML injection Inject 76-98 Units into the skin See admin instructions. Injects 98 units before breakfast and 76 units before supper.   Yes [provider]  metFORMIN (GLUCOPHAGE) 500 MG tablet Take 500 mg by mouth daily with breakfast. 06/01/17  Yes [provider]  metoprolol succinate (TOPROL-XL) 50 MG 24 hr tablet TAKE 1 TABLET BY MOUTH ONCE DAILY 06/08/16  Yes Reed, Tiffany L, DO  omeprazole (PRILOSEC) 20 MG capsule TAKE 1 CAPSULE BY MOUTH DAILY 03/08/17  Yes Reed, Tiffany L, DO  potassium chloride SA (K-DUR,KLOR-CON) 20 MEQ tablet TAKE 1 TABLET(20 MEQ) BY MOUTH DAILY 06/03/16  Yes Reed, Tiffany L, DO  glipiZIDE (GLUCOTROL XL) 10 MG 24 hr tablet TAKE 1 TABLET(10 MG) BY MOUTH DAILY WITH BREAKFAST Patient not taking: Reported on 07/22/2017 10/29/16   Reed, Tiffany L, DO  glucose blood (ACCU-CHEK AVIVA PLUS) test strip Ell.65 check blood sugar twice daily as directed 01/24/16   Reed, Tiffany L, DO  Insulin Pen Needle (BD PEN NEEDLE NANO U/F) 32G X 4 MM MISC Use once daily with the administration of Toujeo DX E11.65 02/20/16   Reed, Tiffany L, DO  lisinopril (PRINIVIL,ZESTRIL) 40 MG tablet TAKE 1 TABLET BY MOUTH DAILY Patient not taking: Reported on 07/22/2017 04/30/14   Reed, Tiffany L, DO  LYRICA 75 MG capsule TAKE 1 CAPSULE BY MOUTH THREE TIMES DAILY AS NEEDED Patient not taking: Reported on 07/22/2017 04/10/16   Reed, Tiffany L, DO  TOUJEO SOLOSTAR 300 UNIT/ML SOPN INJECT 20 UNITS UNDER THE SKIN EVERY NIGHT AT BEDTIME Patient not taking: Reported on 07/22/2017 07/17/16   Reed, Tiffany L, DO   TRADJENTA 5 MG TABS tablet TAKE 1 TABLET BY MOUTH EVERY DAY Patient not taking: Reported on 07/22/2017 05/25/16   Gayland Curry, DO    Family History Family History  Problem Relation Age of Onset  . Heart disease Mother   . Heart disease Father   . Heart disease Brother   . Cancer Brother   . Cancer Brother   . Hypertension Sister   . Heart disease Sister     Social History Social History   Tobacco Use  . Smoking status: Never Smoker  . Smokeless tobacco: Never Used  Substance Use Topics  . Alcohol use: No  . Drug use: No     Allergies   Aspirin; Banana; Hydrocodone; Neomycin-polymyxin-gramicidin; Penicillins; Tramadol; Voltaren [diclofenac sodium]; and Latex   Review of Systems Review of Systems  Gastrointestinal: Positive for abdominal pain.  All other systems reviewed and are negative.    Physical Exam Updated Vital Signs  BP (!) 152/84   Pulse 89   Temp 97.9 F (36.6 C) (Oral)   Resp 20   Ht 5\' 4"  (1.626 m)   Wt 120.7 kg (266 lb)   LMP 07/02/1973   SpO2 95%   BMI 45.66 kg/m   Physical Exam  Constitutional: She is oriented to person, place, and time. She appears well-developed and well-nourished.  HENT:  Head: Normocephalic.  Mouth/Throat: Oropharynx is clear and moist.  Eyes: EOM are normal.  Neck: Normal range of motion.  Cardiovascular: Normal rate, regular rhythm and normal heart sounds.  Pulmonary/Chest: Effort normal.  Abdominal: Normal appearance and bowel sounds are normal. She exhibits mass. She exhibits no distension. There is generalized tenderness.  Large hernia   Musculoskeletal: Normal range of motion.  Neurological: She is alert and oriented to person, place, and time.  Skin: Skin is warm.  Psychiatric: She has a normal mood and affect.  Nursing note and vitals reviewed.    ED Treatments / Results  Labs (all labs ordered are listed, but only abnormal results are displayed) Labs Reviewed  COMPREHENSIVE METABOLIC PANEL -  Abnormal; Notable for the following components:      Result Value   Glucose, Bld 377 (*)    BUN 25 (*)    Creatinine, Ser 1.29 (*)    GFR calc non Af Amer 41 (*)    GFR calc Af Amer 47 (*)    All other components within normal limits  CBC - Abnormal; Notable for the following components:   WBC 11.5 (*)    Hemoglobin 15.5 (*)    All other components within normal limits  URINALYSIS, ROUTINE W REFLEX MICROSCOPIC - Abnormal; Notable for the following components:   Color, Urine AMBER (*)    APPearance HAZY (*)    Glucose, UA >=500 (*)    Ketones, ur 5 (*)    Protein, ur 100 (*)    All other components within normal limits  LIPASE, BLOOD    EKG None  Radiology Ct Abdomen Pelvis W Contrast  Result Date: 07/22/2017 CLINICAL DATA:  Acute left lower quadrant abdominal pain. EXAM: CT ABDOMEN AND PELVIS WITH CONTRAST TECHNIQUE: Multidetector CT imaging of the abdomen and pelvis was performed using the standard protocol following bolus administration of intravenous contrast. CONTRAST:  74mL ISOVUE-300 IOPAMIDOL (ISOVUE-300) INJECTION 61% COMPARISON:  CT scan of February 15, 2016. FINDINGS: Lower chest: No acute abnormality. Hepatobiliary: Status post cholecystectomy. Stable left-sided pneumobilia is noted. Liver is otherwise unremarkable. No biliary dilatation is noted. Pancreas: Unremarkable. No pancreatic ductal dilatation or surrounding inflammatory changes. Spleen: Normal in size without focal abnormality. Adrenals/Urinary Tract: Stable left adrenal adenoma. Right adrenal gland appears normal. No hydronephrosis or renal obstruction is noted. No renal or ureteral calculi are noted. Urinary bladder is decompressed. Stomach/Bowel: The stomach appears normal. Status post appendectomy. Diverticulosis of descending and sigmoid colon is noted without inflammation. Large ventral hernia is noted in the pelvis which contains loops of large and small bowel. Transverse colon is nondilated and there for does  not appear to be incarcerated. However, there does appear to be dilatation of small-bowel loops within the hernia, as well as the loops proximal to the hernia secondary to obstruction. Vascular/Lymphatic: Aortic atherosclerosis. No enlarged abdominal or pelvic lymph nodes. Reproductive: Status post hysterectomy. No adnexal masses. Other: No abnormal fluid collection is noted. Musculoskeletal: No acute or significant osseous findings. IMPRESSION: Large ventral hernia is noted in the pelvis which contains loops of large and small bowel. It  appears to be causing obstruction of the more proximal small bowel as well as dilatation of the small bowel within the hernia. No large bowel dilatation is noted. Aortic Atherosclerosis (ICD10-I70.0). Electronically Signed   By: Marijo Conception, M.D.   On: 07/22/2017 15:41    Procedures Procedures (including critical care time)  Medications Ordered in ED Medications  iopamidol (ISOVUE-300) 61 % injection (has no administration in time range)  iopamidol (ISOVUE-300) 61 % injection 100 mL (80 mLs Intravenous Contrast Given 07/22/17 1512)     Initial Impression / Assessment and Plan / ED Course  I have reviewed the triage vital signs and the nursing notes.  Pertinent labs & imaging results that were available during my care of the patient were reviewed by me and considered in my medical decision making (see chart for details).     MDM  Pt advised of results.   I spoke with Dr.Toth who advised NG tube and have medicine admit.  He will consult.  Hospitalist consulted and will dmit pt   Final Clinical Impressions(s) / ED Diagnoses   Final diagnoses:  Complete intestinal obstruction, unspecified cause [facility])  Hernia with obstruction    ED Discharge Orders    None       Fransico Meadow, [place] 07/22/17 1731    Valarie Merino, MD 07/28/17 1446

## 2017-07-22 NOTE — ED Notes (Signed)
ED TO INPATIENT HANDOFF REPORT  Name/Age/Gender Debra [name] 72 y.o. female  Code Status    Code Status Orders  (From admission, onward)        Start     Ordered   07/22/17 1735  Full code  Continuous     07/22/17 1738    Code Status History    Date Active Date Inactive Code Status Order ID Comments User Context   04/14/2013 1451 04/19/2013 1346 Full Code [id number]  Fanny Skates, MD Inpatient   04/13/2013 1054 04/14/2013 1451 Full Code [id number]  [facility]      Home/SNF/Other Home  Chief Complaint Emesis   Level of Care/Admitting Diagnosis ED Disposition    ED Disposition Condition [facility] Area: [facility] [[id number]]  Level of Care: Med-Surg [16]  Diagnosis: SBO (small bowel obstruction) [facility]) [[id number]]  Admitting Physician: Cristal Ford [phone]  Attending Physician: Cristal Ford [phone]  Estimated length of stay: 3 - 4 days  Certification:: I certify this patient will need inpatient services for at least 2 midnights  PT Class (Do Not Modify): Inpatient [101]  PT Acc Code (Do Not Modify): Private [1]       Medical History Past Medical History:  Diagnosis Date  . Cellulitis of left leg    history of, most recent episode 01/09  . Chronic venous insufficiency   . Diabetes mellitus with neurological manifestation ([place])   . Diverticulitis    h/o 2-3 episodes in past  . Hyperlipidemia LDL goal < 100   . Hypertension goal BP (blood pressure) < 130/80   . Leg edema    secondary to chronic venous insufficiency  . Morbid obesity ([place])   . Osteopenia    DEXA scan 7/07    Allergies Allergies  Allergen Reactions  . Aspirin Nausea Only    And causes bruises  . Banana Hives  . Hydrocodone Hives  . Neomycin-Polymyxin-Gramicidin Itching and Swelling    Eye drops caused swelling in face and rash and itching on arms and neck  . Penicillins Hives and Itching    Has patient had a PCN reaction  causing immediate rash, facial/tongue/throat swelling, SOB or lightheadedness with hypotension: Yes Has patient had a PCN reaction causing severe rash involving mucus membranes or skin necrosis: No Has patient had a PCN reaction that required hospitalization: Yes Has patient had a PCN reaction occurring within the last 10 years: Yes If all of the above answers are "NO", then may proceed with Cephalosporin use.   . Tramadol Nausea And Vomiting  . Voltaren [Diclofenac Sodium] Nausea And Vomiting  . Latex Hives and Rash    IV Location/Drains/Wounds Patient Lines/Drains/Airways Status   Active Line/Drains/Airways    Name:   Placement date:   Placement time:   Site:   Days:   Peripheral IV 07/22/17 Left Antecubital   07/22/17    1420    Antecubital   less than 1   NG/OG Tube Nasogastric 14 Fr. Right nare Aucultation   07/22/17    1754    Right nare   less than 1   Incision 04/14/13 Abdomen Other (Comment)   04/14/13    1323     1560   Incision - 1 Port Abdomen 1: Left;Mid;Lateral   04/14/13    1222     1560   Incision - 6 Ports Abdomen 1: Right;Superior 2: Right;Lateral 3: Umbilicus Medial;Superior 5: Left;Superior 6: Left;Proximal;Mid   04/14/13  1219     1560          Labs/Imaging Results for orders placed or performed during the hospital encounter of 07/22/17 (from the past 48 hour(s))  Lipase, blood     Status: None   Collection Time: 07/22/17  1:32 PM  Result Value Ref Range   Lipase 23 11 - 51 U/L    Comment: Performed at [facility], [place] [address] [place], [place] [address]  Comprehensive metabolic panel     Status: Abnormal   Collection Time: 07/22/17  1:32 PM  Result Value Ref Range   Sodium 140 135 - 145 mmol/L   Potassium 4.6 3.5 - 5.1 mmol/L   Chloride 102 101 - 111 mmol/L   CO2 24 22 - 32 mmol/L   Glucose, Bld 377 (H) 65 - 99 mg/dL   BUN 25 (H) 6 - 20 mg/dL   Creatinine, Ser 1.29 (H) 0.44 - 1.00 mg/dL   Calcium 9.1 8.9 - 10.3 mg/dL   Total  Protein 7.6 6.5 - 8.1 g/dL   Albumin 4.0 3.5 - 5.0 g/dL   AST 24 15 - 41 U/L   ALT 20 14 - 54 U/L   Alkaline Phosphatase 87 38 - 126 U/L   Total Bilirubin 1.2 0.3 - 1.2 mg/dL   GFR calc non Af Amer 41 (L) >60 mL/min   GFR calc Af Amer 47 (L) >60 mL/min    Comment: (NOTE) The eGFR has been calculated using the CKD EPI equation. This calculation has not been validated in all clinical situations. eGFR's persistently <60 mL/min signify possible Chronic Kidney Disease.    Anion gap 14 5 - 15    Comment: Performed at [facility], [place] [address], [place], [place] [address]  CBC     Status: Abnormal   Collection Time: 07/22/17  1:32 PM  Result Value Ref Range   WBC 11.5 (H) 4.0 - 10.5 K/uL   RBC 5.00 3.87 - 5.11 MIL/uL   Hemoglobin 15.5 (H) 12.0 - 15.0 g/dL   HCT 45.3 36.0 - 46.0 %   MCV 90.6 78.0 - 100.0 fL   MCH 31.0 26.0 - 34.0 pg   MCHC 34.2 30.0 - 36.0 g/dL   RDW 13.9 11.5 - 15.5 %   Platelets 287 150 - 400 K/uL    Comment: Performed at [facility], [place] [address] [place],  [address]  Urinalysis, Routine w reflex microscopic     Status: Abnormal   Collection Time: 07/22/17  3:07 PM  Result Value Ref Range   Color, Urine AMBER (A) YELLOW    Comment: BIOCHEMICALS MAY BE AFFECTED BY COLOR   APPearance HAZY (A) CLEAR   Specific Gravity, Urine 1.028 1.005 - 1.030   pH 5.0 5.0 - 8.0   Glucose, UA >=500 (A) NEGATIVE mg/dL   Hgb urine dipstick NEGATIVE NEGATIVE   Bilirubin Urine NEGATIVE NEGATIVE   Ketones, ur 5 (A) NEGATIVE mg/dL   Protein, ur 100 (A) NEGATIVE mg/dL   Nitrite NEGATIVE NEGATIVE   Leukocytes, UA NEGATIVE NEGATIVE   RBC / HPF 0-5 0 - 5 RBC/hpf   WBC, UA 0-5 0 - 5 WBC/hpf   Bacteria, UA NONE SEEN NONE SEEN   Squamous Epithelial / LPF 0-5 0 - 5    Comment: Please note change in reference range.   Mucus PRESENT    Hyaline Casts, UA PRESENT     Comment: Performed at [facility], [place]  [address] [place], [place] [address]  CBG monitoring, ED     Status: Abnormal   Collection Time: 07/22/17  6:09 PM  Result Value Ref Range   Glucose-Capillary 346 (H) 65 - 99 mg/dL   Ct Abdomen Pelvis W Contrast  Result Date: 07/22/2017 CLINICAL DATA:  Acute left lower quadrant abdominal pain. EXAM: CT ABDOMEN AND PELVIS WITH CONTRAST TECHNIQUE: Multidetector CT imaging of the abdomen and pelvis was performed using the standard protocol following bolus administration of intravenous contrast. CONTRAST:  73m ISOVUE-300 IOPAMIDOL (ISOVUE-300) INJECTION 61% COMPARISON:  CT scan of February 15, 2016. FINDINGS: Lower chest: No acute abnormality. Hepatobiliary: Status post cholecystectomy. Stable left-sided pneumobilia is noted. Liver is otherwise unremarkable. No biliary dilatation is noted. Pancreas: Unremarkable. No pancreatic ductal dilatation or surrounding inflammatory changes. Spleen: Normal in size without focal abnormality. Adrenals/Urinary Tract: Stable left adrenal adenoma. Right adrenal gland appears normal. No hydronephrosis or renal obstruction is noted. No renal or ureteral calculi are noted. Urinary bladder is decompressed. Stomach/Bowel: The stomach appears normal. Status post appendectomy. Diverticulosis of descending and sigmoid colon is noted without inflammation. Large ventral hernia is noted in the pelvis which contains loops of large and small bowel. Transverse colon is nondilated and there for does not appear to be incarcerated. However, there does appear to be dilatation of small-bowel loops within the hernia, as well as the loops proximal to the hernia secondary to obstruction. Vascular/Lymphatic: Aortic atherosclerosis. No enlarged abdominal or pelvic lymph nodes. Reproductive: Status post hysterectomy. No adnexal masses. Other: No abnormal fluid collection is noted. Musculoskeletal: No acute or significant osseous findings. IMPRESSION: Large ventral hernia is noted in the pelvis which contains  loops of large and small bowel. It appears to be causing obstruction of the more proximal small bowel as well as dilatation of the small bowel within the hernia. No large bowel dilatation is noted. Aortic Atherosclerosis (ICD10-I70.0). Electronically Signed   By: JMarijo Conception M.D.   On: 07/22/2017 15:41   Dg Abd Portable 1 View  Result Date: 07/22/2017 CLINICAL DATA:  Evaluate NG tube placement EXAM: PORTABLE ABDOMEN - 1 VIEW COMPARISON:  CT abdomen pelvis 07/22/2017 FINDINGS: Enteric tube tip and side-port project over the stomach. Contrast material excreted in the bilateral renal collecting systems. Mildly gaseous distended loop of small bowel within the central abdomen. IMPRESSION: Enteric tube tip and side-port project over the stomach. Electronically Signed   By: DLovey NewcomerM.D.   On: 07/22/2017 18:21    Pending Labs Unresulted Labs (From admission, onward)   Start     Ordered   07/23/17 0500  Comprehensive metabolic panel  Tomorrow morning,   R     07/22/17 1738   07/23/17 0500  CBC  Tomorrow morning,   R     07/22/17 1738   07/22/17 1733  CBC  (heparin)  Once,   R    Comments:  Baseline for heparin therapy IF NOT ALREADY DRAWN.  Notify MD if PLT < 100 K.    07/22/17 1738   07/22/17 1733  Creatinine, serum  (heparin)  Once,   R    Comments:  Baseline for heparin therapy IF NOT ALREADY DRAWN.    07/22/17 1738      Vitals/Pain Today's Vitals   07/22/17 1624 07/22/17 1630 07/22/17 1700 07/22/17 1730  BP: (!) 141/61 (!) 152/84 (!) 142/67 128/76  Pulse: 89 89 90 90  Resp: 20     Temp:      TempSrc:  SpO2: 98% 95% 92% 98%  Weight:      Height:      PainSc:        Isolation Precautions No active isolations  Medications Medications  iopamidol (ISOVUE-300) 61 % injection (has no administration in time range)  heparin injection 5,000 Units (has no administration in time range)  0.9 %  sodium chloride infusion (75 mL/hr Intravenous Transfusing/Transfer 07/22/17 1825)   morphine 2 MG/ML injection 2 mg (has no administration in time range)  ondansetron (ZOFRAN) injection 4 mg (has no administration in time range)  insulin aspart (novoLOG) injection 0-15 Units (11 Units Subcutaneous Given 07/22/17 1819)  metoprolol tartrate (LOPRESSOR) injection 2.5 mg (2.5 mg Intravenous Given 07/22/17 1821)  iopamidol (ISOVUE-300) 61 % injection 100 mL (80 mLs Intravenous Contrast Given 07/22/17 1512)    Mobility walks

## 2017-07-22 NOTE — Consult Note (Signed)
Reason for Consult:vomiting Referring Physician: Dr. Georgena Spurling is an 72 y.o. female.  HPI: The patient is a 72 yo white female who presents with abdominal pain and nausea that started on Tuesday. She has had vomiting with it. She has had a bulge over lower abdomen for years. She has a history of multiple surgeries including partial colectomy and colostomy with reversal. CT shows large ventral hernia with small and large bowel and some dilated small bowel associated with hernia  Past Medical History:  Diagnosis Date  . Cellulitis of left leg    history of, most recent episode 01/09  . Chronic venous insufficiency   . 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 Diverticulitis    h/o 2-3 episodes in past  . Hyperlipidemia LDL goal < 100   . Hypertension goal BP (blood pressure) < 130/80   . Leg edema    secondary to chronic venous insufficiency  . Morbid obesity ([place])   . Osteopenia    DEXA scan 7/07    Past Surgical History:  Procedure Laterality Date  . ABDOMINAL HYSTERECTOMY    . APPENDECTOMY    . CHOLECYSTECTOMY    . CHOLECYSTECTOMY N/A 04/14/2013   Procedure: LAPAROSCOPIC CHOLECYSTECTOMY WITH INTRAOPERATIVE CHOLANGIOGRAM;  Surgeon: Adin Hector, MD;  Location: [facility];  Service: General;  Laterality: N/A;  . COLECTOMY     with diverting colsotmy and revision in the 1990's for diverticulitis  . ERCP N/A 04/17/2013   Procedure: ENDOSCOPIC RETROGRADE CHOLANGIOPANCREATOGRAPHY (ERCP);  Surgeon: Irene Shipper, MD;  Location: [facility];  Service: Endoscopy;  Laterality: N/A;  note pt want general anesthesia for this case.  preferto perform in endo unit, but if unavailable please arrange time in OR  . EYE SURGERY  8/13   left cataract removal & retinal repair  . LAPAROSCOPIC LYSIS OF ADHESIONS N/A 04/14/2013   Procedure: LAPAROSCOPIC LYSIS OF ADHESIONS;  Surgeon: Adin Hector, MD;  Location: [facility];  Service: General;  Laterality: N/A;    Family  History  Problem Relation Age of Onset  . Heart disease Mother   . Heart disease Father   . Heart disease Brother   . Cancer Brother   . Cancer Brother   . Hypertension Sister   . Heart disease Sister     Social History:  reports that she has never smoked. She has never used smokeless tobacco. She reports that she does not drink alcohol or use drugs.  Allergies:  Allergies  Allergen Reactions  . Aspirin Nausea Only    And causes bruises  . Banana Hives  . Hydrocodone Hives  . Neomycin-Polymyxin-Gramicidin Itching and Swelling    Eye drops caused swelling in face and rash and itching on arms and neck  . Penicillins Hives and Itching    Has patient had a PCN reaction causing immediate rash, facial/tongue/throat swelling, SOB or lightheadedness with hypotension: Yes Has patient had a PCN reaction causing severe rash involving mucus membranes or skin necrosis: No Has patient had a PCN reaction that required hospitalization: Yes Has patient had a PCN reaction occurring within the last 10 years: Yes If all of the above answers are "NO", then may proceed with Cephalosporin use.   . Tramadol Nausea And Vomiting  . Voltaren [Diclofenac Sodium] Nausea And Vomiting  . Latex Hives and Rash    Medications: I have reviewed the patient's current medications.  Results for orders placed or performed during the hospital encounter of 07/22/17 (from  the past 48 hour(s))  Lipase, blood     Status: None   Collection Time: 07/22/17  1:32 PM  Result Value Ref Range   Lipase 23 11 - 51 U/L    Comment: Performed at [facility], [place] [address] [place], [place] [address]  Comprehensive metabolic panel     Status: Abnormal   Collection Time: 07/22/17  1:32 PM  Result Value Ref Range   Sodium 140 135 - 145 mmol/L   Potassium 4.6 3.5 - 5.1 mmol/L   Chloride 102 101 - 111 mmol/L   CO2 24 22 - 32 mmol/L   Glucose, Bld 377 (H) 65 - 99 mg/dL   BUN 25 (H) 6 - 20 mg/dL    Creatinine, Ser 1.29 (H) 0.44 - 1.00 mg/dL   Calcium 9.1 8.9 - 10.3 mg/dL   Total Protein 7.6 6.5 - 8.1 g/dL   Albumin 4.0 3.5 - 5.0 g/dL   AST 24 15 - 41 U/L   ALT 20 14 - 54 U/L   Alkaline Phosphatase 87 38 - 126 U/L   Total Bilirubin 1.2 0.3 - 1.2 mg/dL   GFR calc non Af Amer 41 (L) >60 mL/min   GFR calc Af Amer 47 (L) >60 mL/min    Comment: (NOTE) The eGFR has been calculated using the CKD EPI equation. This calculation has not been validated in all clinical situations. eGFR's persistently <60 mL/min signify possible Chronic Kidney Disease.    Anion gap 14 5 - 15    Comment: Performed at [facility], [place] [address] [place], [place] [address]  CBC     Status: Abnormal   Collection Time: 07/22/17  1:32 PM  Result Value Ref Range   WBC 11.5 (H) 4.0 - 10.5 K/uL   RBC 5.00 3.87 - 5.11 MIL/uL   Hemoglobin 15.5 (H) 12.0 - 15.0 g/dL   HCT 45.3 36.0 - 46.0 %   MCV 90.6 78.0 - 100.0 fL   MCH 31.0 26.0 - 34.0 pg   MCHC 34.2 30.0 - 36.0 g/dL   RDW 13.9 11.5 - 15.5 %   Platelets 287 150 - 400 K/uL    Comment: Performed at [facility], [place] [address], [place], [place] [address]  Urinalysis, Routine w reflex microscopic     Status: Abnormal   Collection Time: 07/22/17  3:07 PM  Result Value Ref Range   Color, Urine AMBER (A) YELLOW    Comment: BIOCHEMICALS MAY BE AFFECTED BY COLOR   APPearance HAZY (A) CLEAR   Specific Gravity, Urine 1.028 1.005 - 1.030   pH 5.0 5.0 - 8.0   Glucose, UA >=500 (A) NEGATIVE mg/dL   Hgb urine dipstick NEGATIVE NEGATIVE   Bilirubin Urine NEGATIVE NEGATIVE   Ketones, ur 5 (A) NEGATIVE mg/dL   Protein, ur 100 (A) NEGATIVE mg/dL   Nitrite NEGATIVE NEGATIVE   Leukocytes, UA NEGATIVE NEGATIVE   RBC / HPF 0-5 0 - 5 RBC/hpf   WBC, UA 0-5 0 - 5 WBC/hpf   Bacteria, UA NONE SEEN NONE SEEN   Squamous Epithelial / LPF 0-5 0 - 5    Comment: Please note change in reference range.   Mucus PRESENT    Hyaline Casts, UA  PRESENT     Comment: Performed at [facility], [place] [address], [place], [place] [address]  CBG monitoring, ED     Status: Abnormal   Collection Time: 07/22/17  6:09 PM  Result Value Ref Range  Glucose-Capillary 346 (H) 65 - 99 mg/dL  CBC     Status: Abnormal   Collection Time: 07/22/17  7:11 PM  Result Value Ref Range   WBC 9.8 4.0 - 10.5 K/uL   RBC 5.25 (H) 3.87 - 5.11 MIL/uL   Hemoglobin 15.8 (H) 12.0 - 15.0 g/dL   HCT 47.3 (H) 36.0 - 46.0 %   MCV 90.1 78.0 - 100.0 fL   MCH 30.1 26.0 - 34.0 pg   MCHC 33.4 30.0 - 36.0 g/dL   RDW 13.9 11.5 - 15.5 %   Platelets 279 150 - 400 K/uL    Comment: Performed at [facility], [place] [address], [place], [place] [address]  Creatinine, serum     Status: Abnormal   Collection Time: 07/22/17  7:11 PM  Result Value Ref Range   Creatinine, Ser 1.12 (H) 0.44 - 1.00 mg/dL   GFR calc non Af Amer 49 (L) >60 mL/min   GFR calc Af Amer 56 (L) >60 mL/min    Comment: (NOTE) The eGFR has been calculated using the CKD EPI equation. This calculation has not been validated in all clinical situations. eGFR's persistently <60 mL/min signify possible Chronic Kidney Disease. Performed at [facility] [place] [address] [place],  [address]   Glucose, capillary     Status: Abnormal   Collection Time: 07/22/17  8:31 PM  Result Value Ref Range   Glucose-Capillary 362 (H) 65 - 99 mg/dL    Ct Abdomen Pelvis W Contrast  Result Date: 07/22/2017 CLINICAL DATA:  Acute left lower quadrant abdominal pain. EXAM: CT ABDOMEN AND PELVIS WITH CONTRAST TECHNIQUE: Multidetector CT imaging of the abdomen and pelvis was performed using the standard protocol following bolus administration of intravenous contrast. CONTRAST:  55m ISOVUE-300 IOPAMIDOL (ISOVUE-300) INJECTION 61% COMPARISON:  CT scan of February 15, 2016. FINDINGS: Lower chest: No acute abnormality. Hepatobiliary: Status post cholecystectomy. Stable  left-sided pneumobilia is noted. Liver is otherwise unremarkable. No biliary dilatation is noted. Pancreas: Unremarkable. No pancreatic ductal dilatation or surrounding inflammatory changes. Spleen: Normal in size without focal abnormality. Adrenals/Urinary Tract: Stable left adrenal adenoma. Right adrenal gland appears normal. No hydronephrosis or renal obstruction is noted. No renal or ureteral calculi are noted. Urinary bladder is decompressed. Stomach/Bowel: The stomach appears normal. Status post appendectomy. Diverticulosis of descending and sigmoid colon is noted without inflammation. Large ventral hernia is noted in the pelvis which contains loops of large and small bowel. Transverse colon is nondilated and there for does not appear to be incarcerated. However, there does appear to be dilatation of small-bowel loops within the hernia, as well as the loops proximal to the hernia secondary to obstruction. Vascular/Lymphatic: Aortic atherosclerosis. No enlarged abdominal or pelvic lymph nodes. Reproductive: Status post hysterectomy. No adnexal masses. Other: No abnormal fluid collection is noted. Musculoskeletal: No acute or significant osseous findings. IMPRESSION: Large ventral hernia is noted in the pelvis which contains loops of large and small bowel. It appears to be causing obstruction of the more proximal small bowel as well as dilatation of the small bowel within the hernia. No large bowel dilatation is noted. Aortic Atherosclerosis (ICD10-I70.0). Electronically Signed   By: JMarijo Conception M.D.   On: 07/22/2017 15:41   Dg Abd Portable 1 View  Result Date: 07/22/2017 CLINICAL DATA:  Evaluate NG tube placement EXAM: PORTABLE ABDOMEN - 1 VIEW COMPARISON:  CT abdomen pelvis 07/22/2017 FINDINGS: Enteric tube tip and side-port project over the stomach. Contrast material excreted in the  bilateral renal collecting systems. Mildly gaseous distended loop of small bowel within the central abdomen. IMPRESSION:  Enteric tube tip and side-port project over the stomach. Electronically Signed   By: Lovey Newcomer M.D.   On: 07/22/2017 18:21    Review of Systems  Constitutional: Negative.   HENT: Negative.   Eyes: Negative.   Respiratory: Negative.   Cardiovascular: Negative.   Gastrointestinal: Positive for abdominal pain, nausea and vomiting.  Genitourinary: Negative.   Musculoskeletal: Negative.   Skin: Negative.   Neurological: Negative.   Endo/Heme/Allergies: Negative.   Psychiatric/Behavioral: Negative.    Blood pressure (!) 146/77, pulse 89, temperature (!) 97.4 F (36.3 C), temperature source Oral, resp. rate 15, height _0  (1.626 m), weight 120.7 kg (266 lb), last menstrual period 07/02/1973, SpO2 96 %. Physical Exam  Constitutional: She is oriented to person, place, and time. She appears well-developed and well-nourished. No distress.  HENT:  Head: Normocephalic and atraumatic.  Mouth/Throat: No oropharyngeal exudate.  Eyes: Pupils are equal, round, and reactive to light. Conjunctivae and EOM are normal.  Neck: Normal range of motion. Neck supple.  Cardiovascular: Normal rate, regular rhythm and normal heart sounds.  Respiratory: Effort normal and breath sounds normal. No stridor. No respiratory distress.  GI: Soft.  There is mild tenderness diffusely. There is a large ventral hernia of lower abdomen that seems to partially reduce and is fairly soft  Musculoskeletal: Normal range of motion. She exhibits no edema or tenderness.  Neurological: She is alert and oriented to person, place, and time. Coordination normal.  Skin: Skin is warm and dry. No rash noted.  Psychiatric: She has a normal mood and affect. Her behavior is normal. Thought content normal.    Assessment/Plan: The patient appears to have a large ventral hernia containing many loops of bowel with some evidence of obstruction associated with it. Since her hernia is soft and she does not appear to be ill we will plan to  decompress with ng and rest bowels. She will need IV hydration. She will likely need surgery during this hospitalization to fix hernia. Will discuss with primary team in am. She is in agreement with this plan  TOTH III,Markies Mowatt S 07/22/2017, 8:57 PM

## 2017-07-22 NOTE — ED Triage Notes (Signed)
Pt complaint of LLQ pain with n/v for 36 hours; new onset diarrhea today.

## 2017-07-22 NOTE — ED Notes (Signed)
Transport called.

## 2017-07-22 NOTE — H&P (Addendum)
[facility] History and Physical  DEMPSEY KNOTEK TFT:[id number] DOB: Apr 23, 1945 DOA: 07/22/2017  PCP: Vicenta Aly, FNP  Patient coming from: home  Chief Complaint: Abdominal pain  HPI: Debra [name] is a 72 y.o. female with a medical history of diabetes mellitus, hypertension, obesity, who presented to the emergency department with complaints of abdominal pain, nausea, and vomiting which have been ongoing for the past 36 hours.  Patient states she has had a pain in her abdomen which is characterized as achy, nothing seems to help the pain or make it worse.  Patient was able to have 2 bowel movements, loose stools.  Patient had presented to her primary care office today and was then sent to the emergency department.  She currently denies any chest pain, shortness of breath, headache, dizziness, change in urinary frequency, recent travel or illness.  ED Course: CT abdomen pelvis showed large ventral hernia with bowel obstruction.  General surgery called.  TRH called for admission.  Review of Systems:  All other systems reviewed and are negative.   Past Medical History:  Diagnosis Date  . Cellulitis of left leg    history of, most recent episode 01/09  . Chronic venous insufficiency   . 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 Diverticulitis    h/o 2-3 episodes in past  . Hyperlipidemia LDL goal < 100   . Hypertension goal BP (blood pressure) < 130/80   . Leg edema    secondary to chronic venous insufficiency  . Morbid obesity ([place])   . Osteopenia    DEXA scan 7/07    Past Surgical History:  Procedure Laterality Date  . ABDOMINAL HYSTERECTOMY    . APPENDECTOMY    . CHOLECYSTECTOMY    . CHOLECYSTECTOMY N/A 04/14/2013   Procedure: LAPAROSCOPIC CHOLECYSTECTOMY WITH INTRAOPERATIVE CHOLANGIOGRAM;  Surgeon: Adin Hector, MD;  Location: [facility];  Service: General;  Laterality: N/A;  . COLECTOMY     with diverting colsotmy and revision in the 1990's for  diverticulitis  . ERCP N/A 04/17/2013   Procedure: ENDOSCOPIC RETROGRADE CHOLANGIOPANCREATOGRAPHY (ERCP);  Surgeon: Irene Shipper, MD;  Location: [facility];  Service: Endoscopy;  Laterality: N/A;  note pt want general anesthesia for this case.  preferto perform in endo unit, but if unavailable please arrange time in OR  . EYE SURGERY  8/13   left cataract removal & retinal repair  . LAPAROSCOPIC LYSIS OF ADHESIONS N/A 04/14/2013   Procedure: LAPAROSCOPIC LYSIS OF ADHESIONS;  Surgeon: Adin Hector, MD;  Location: [facility];  Service: General;  Laterality: N/A;    Social History:  reports that she has never smoked. She has never used smokeless tobacco. She reports that she does not drink alcohol or use drugs.   Allergies  Allergen Reactions  . Aspirin Nausea Only    And causes bruises  . Banana Hives  . Hydrocodone Hives  . Neomycin-Polymyxin-Gramicidin Itching and Swelling    Eye drops caused swelling in face and rash and itching on arms and neck  . Penicillins Hives and Itching    Has patient had a PCN reaction causing immediate rash, facial/tongue/throat swelling, SOB or lightheadedness with hypotension: Yes Has patient had a PCN reaction causing severe rash involving mucus membranes or skin necrosis: No Has patient had a PCN reaction that required hospitalization: Yes Has patient had a PCN reaction occurring within the last 10 years: Yes If all of the above answers are "NO", then may proceed with Cephalosporin use.   Marland Kitchen  Tramadol Nausea And Vomiting  . Voltaren [Diclofenac Sodium] Nausea And Vomiting  . Latex Hives and Rash    Family History  Problem Relation Age of Onset  . Heart disease Mother   . Heart disease Father   . Heart disease Brother   . Cancer Brother   . Cancer Brother   . Hypertension Sister   . Heart disease Sister     Prior to Admission medications   Medication Sig Start Date End Date Taking? Authorizing Provider  atorvastatin (LIPITOR) 40 MG tablet TAKE  1 TABLET BY MOUTH EVERY DAY Patient taking differently: TAKE 1 TABLET BY MOUTH at bedtime 01/05/17  Yes Lauree Chandler, NP  celecoxib (CELEBREX) 200 MG capsule Take 200 mg by mouth 2 (two) times daily. 04/20/17  Yes [provider]  diphenhydrAMINE (BENADRYL) 25 MG tablet Take 25 mg by mouth at bedtime as needed for itching.   Yes [provider]  furosemide (LASIX) 20 MG tablet TAKE 1 TABLET(20 MG) BY MOUTH DAILY FOR SWELLING 08/31/16  Yes Reed, Tiffany L, DO  insulin NPH Human (HUMULIN N,NOVOLIN N) 100 UNIT/ML injection Inject 76-98 Units into the skin See admin instructions. Injects 98 units before breakfast and 76 units before supper.   Yes [provider]  metFORMIN (GLUCOPHAGE) 500 MG tablet Take 500 mg by mouth daily with breakfast. 06/01/17  Yes [provider]  metoprolol succinate (TOPROL-XL) 50 MG 24 hr tablet TAKE 1 TABLET BY MOUTH ONCE DAILY 06/08/16  Yes Reed, Tiffany L, DO  omeprazole (PRILOSEC) 20 MG capsule TAKE 1 CAPSULE BY MOUTH DAILY 03/08/17  Yes Reed, Tiffany L, DO  potassium chloride SA (K-DUR,KLOR-CON) 20 MEQ tablet TAKE 1 TABLET(20 MEQ) BY MOUTH DAILY 06/03/16  Yes Reed, Tiffany L, DO  glucose blood (ACCU-CHEK AVIVA PLUS) test strip Ell.65 check blood sugar twice daily as directed 01/24/16   Reed, Tiffany L, DO  Insulin Pen Needle (BD PEN NEEDLE NANO U/F) 32G X 4 MM MISC Use once daily with the administration of Toujeo DX E11.65 02/20/16   Gayland Curry, DO    Physical Exam: Vitals:   07/22/17 1624 07/22/17 1630  BP: (!) 141/61 (!) 152/84  Pulse: 89 89  Resp: 20   Temp:    SpO2: 98% 95%     General: Well developed, well nourished, NAD, appears stated age  HEENT: NCAT, PERRLA, EOMI, Anicteic Sclera, mucous membranes dry. Poor dentition  Neck: Supple, no JVD, no masses  Cardiovascular: S1 S2 auscultated, 2/6SEM, RRR  Respiratory: Clear to auscultation bilaterally with equal chest rise  Abdomen: Soft, obese, generalized TTP  , nondistended, +few bowel sounds, Large hernia  Extremities: warm dry without cyanosis clubbing or edema  Neuro: AAOx3, cranial nerves grossly intact. Strength equal and bilateral in upper and lower extremities bilaterally  Skin: Without rashes exudates or nodules; lower extremities with abrasions and healing wounds  Psych: Normal affect and demeanor with intact judgement and insight  Labs on Admission: I have personally reviewed following labs and imaging studies CBC: Recent Labs  Lab 07/22/17 1332  WBC 11.5*  HGB 15.5*  HCT 45.3  MCV 90.6  PLT 557   Basic Metabolic Panel: Recent Labs  Lab 07/22/17 1332  NA 140  K 4.6  CL 102  CO2 24  GLUCOSE 377*  BUN 25*  CREATININE 1.29*  CALCIUM 9.1   GFR: Estimated Creatinine Clearance: 52 mL/min (A) (by C-G formula based on SCr of 1.29 mg/dL (H)). Liver Function Tests: Recent Labs  Lab  07/22/17 1332  AST 24  ALT 20  ALKPHOS 87  BILITOT 1.2  PROT 7.6  ALBUMIN 4.0   Recent Labs  Lab 07/22/17 1332  LIPASE 23   No results for input(s): AMMONIA in the last 168 hours. Coagulation Profile: No results for input(s): INR, PROTIME in the last 168 hours. Cardiac Enzymes: No results for input(s): CKTOTAL, CKMB, CKMBINDEX, TROPONINI in the last 168 hours. BNP (last 3 results) No results for input(s): PROBNP in the last 8760 hours. HbA1C: No results for input(s): HGBA1C in the last 72 hours. CBG: No results for input(s): GLUCAP in the last 168 hours. Lipid Profile: No results for input(s): CHOL, HDL, LDLCALC, TRIG, CHOLHDL, LDLDIRECT in the last 72 hours. Thyroid Function Tests: No results for input(s): TSH, T4TOTAL, FREET4, T3FREE, THYROIDAB in the last 72 hours. Anemia Panel: No results for input(s): VITAMINB12, FOLATE, FERRITIN, TIBC, IRON, RETICCTPCT in the last 72 hours. Urine analysis:    Component Value Date/Time   COLORURINE AMBER (A) 07/22/2017 1507   APPEARANCEUR HAZY (A) 07/22/2017 1507   LABSPEC 1.028  07/22/2017 1507   PHURINE 5.0 07/22/2017 1507   GLUCOSEU >=500 (A) 07/22/2017 1507   GLUCOSEU NEG mg/dL 04/13/2007 0230   HGBUR NEGATIVE 07/22/2017 1507   BILIRUBINUR NEGATIVE 07/22/2017 1507   BILIRUBINUR Neg 01/01/2015 1348   KETONESUR 5 (A) 07/22/2017 1507   PROTEINUR 100 (A) 07/22/2017 1507   UROBILINOGEN 2.0 01/01/2015 1348   UROBILINOGEN 0.2 04/13/2013 0714   NITRITE NEGATIVE 07/22/2017 1507   LEUKOCYTESUR NEGATIVE 07/22/2017 1507   Sepsis Labs: @LABRCNTIP (procalcitonin:4,lacticidven:4) )No results found for this or any previous visit (from the past 240 hour(s)).   Radiological Exams on Admission: Ct Abdomen Pelvis W Contrast  Result Date: 07/22/2017 CLINICAL DATA:  Acute left lower quadrant abdominal pain. EXAM: CT ABDOMEN AND PELVIS WITH CONTRAST TECHNIQUE: Multidetector CT imaging of the abdomen and pelvis was performed using the standard protocol following bolus administration of intravenous contrast. CONTRAST:  64mL ISOVUE-300 IOPAMIDOL (ISOVUE-300) INJECTION 61% COMPARISON:  CT scan of February 15, 2016. FINDINGS: Lower chest: No acute abnormality. Hepatobiliary: Status post cholecystectomy. Stable left-sided pneumobilia is noted. Liver is otherwise unremarkable. No biliary dilatation is noted. Pancreas: Unremarkable. No pancreatic ductal dilatation or surrounding inflammatory changes. Spleen: Normal in size without focal abnormality. Adrenals/Urinary Tract: Stable left adrenal adenoma. Right adrenal gland appears normal. No hydronephrosis or renal obstruction is noted. No renal or ureteral calculi are noted. Urinary bladder is decompressed. Stomach/Bowel: The stomach appears normal. Status post appendectomy. Diverticulosis of descending and sigmoid colon is noted without inflammation. Large ventral hernia is noted in the pelvis which contains loops of large and small bowel. Transverse colon is nondilated and there for does not appear to be incarcerated. However, there does appear to  be dilatation of small-bowel loops within the hernia, as well as the loops proximal to the hernia secondary to obstruction. Vascular/Lymphatic: Aortic atherosclerosis. No enlarged abdominal or pelvic lymph nodes. Reproductive: Status post hysterectomy. No adnexal masses. Other: No abnormal fluid collection is noted. Musculoskeletal: No acute or significant osseous findings. IMPRESSION: Large ventral hernia is noted in the pelvis which contains loops of large and small bowel. It appears to be causing obstruction of the more proximal small bowel as well as dilatation of the small bowel within the hernia. No large bowel dilatation is noted. Aortic Atherosclerosis (ICD10-I70.0). Electronically Signed   By: Marijo Conception, M.D.   On: 07/22/2017 15:41    EKG: None  Assessment/Plan  Abdominal pain with nausea  and vomiting secondary to small bowel obstruction with a large ventral hernia -CT abdomen pelvis showed large ventral hernia in the pelvis which contains loops of large and small bowel causing obstruction of the more proximal small bowel as well as dilatation of small bowel within the hernia.  No large bowel dilatation is noted. -General surgery consulted and appreciated -Order to place NG tube -Continue IV fluids, pain control, antiemetics as needed -NPO -will order EKG  Acute kidney injury -Suspect secondary to poor oral intake, vomiting -Creatinine appears to be 0.7-0.8 from 2017 to 2018, upon admission, creatinine 1.29 -will place patient on normal saline and monitor BMP  Diabetes mellitus, type II -Last hemoglobin A1c 7.6 on 05/22/2016 -Will hold patient's home medications: Metformin, Novolin -Will place patient on insulin sliding scale with CBG monitoring every 4 hours  Essential hypertension -Hold Lasix and metoprolol orally, will place patient on 2.5 mg IV metoprolol every 6 hours with holding parameters  GERD -Hold omeprazole  Hyperlipidemia -Hold statin  Morbid obesity -BMI  45.64 -will need nutrition consult when improved  DVT prophylaxis: Heparin  Code Status: Full  Family Communication: Husband at bedside. Admission, patients condition and plan of care including tests being ordered have been discussed with the patient and husband who indicate understanding and agree with the plan and Code Status.  Disposition Plan: Home    Consults called: General surgery by EDP   Admission status: Admitted (patient will require inpatient admission as she will need NG tube placement as well as possible surgical intervention for small bowel obstruction and hernia repair)  Time spent: 53 minutes  Jalani Rominger D.O. [facility] Pager [phone]  If 7PM-7AM, please contact night-coverage [email or web address] Password [facility] 07/22/2017, 5:56 PM

## 2017-07-23 ENCOUNTER — Inpatient Hospital Stay (HOSPITAL_COMMUNITY): Payer: Medicare HMO

## 2017-07-23 LAB — COMPREHENSIVE METABOLIC PANEL
ALT: 17 U/L (ref 14–54)
ANION GAP: 11 (ref 5–15)
AST: 20 U/L (ref 15–41)
Albumin: 3.4 g/dL — ABNORMAL LOW (ref 3.5–5.0)
Alkaline Phosphatase: 68 U/L (ref 38–126)
BUN: 29 mg/dL — ABNORMAL HIGH (ref 6–20)
CHLORIDE: 108 mmol/L (ref 101–111)
CO2: 24 mmol/L (ref 22–32)
Calcium: 8.3 mg/dL — ABNORMAL LOW (ref 8.9–10.3)
Creatinine, Ser: 0.91 mg/dL (ref 0.44–1.00)
GFR calc Af Amer: >60 mL/min (ref >60)
GFR calc non Af Amer: >60 mL/min (ref >60)
Glucose, Bld: 145 mg/dL — ABNORMAL HIGH (ref 65–99)
POTASSIUM: 3.6 mmol/L (ref 3.5–5.1)
SODIUM: 143 mmol/L (ref 135–145)
Total Bilirubin: 1 mg/dL (ref 0.3–1.2)
Total Protein: 6.7 g/dL (ref 6.5–8.1)

## 2017-07-23 LAB — CBC
HCT: 42.9 % (ref 36.0–46.0)
HEMOGLOBIN: 14.2 g/dL (ref 12.0–15.0)
MCH: 30.1 pg (ref 26.0–34.0)
MCHC: 33.1 g/dL (ref 30.0–36.0)
MCV: 90.9 fL (ref 78.0–100.0)
Platelets: 252 10*3/uL (ref 150–400)
RBC: 4.72 MIL/uL (ref 3.87–5.11)
RDW: 14.1 % (ref 11.5–15.5)
WBC: 7.7 10*3/uL (ref 4.0–10.5)

## 2017-07-23 LAB — GLUCOSE, CAPILLARY
GLUCOSE-CAPILLARY: 159 mg/dL — AB (ref 65–99)
Glucose-Capillary: 139 mg/dL — ABNORMAL HIGH (ref 65–99)
Glucose-Capillary: 150 mg/dL — ABNORMAL HIGH (ref 65–99)
Glucose-Capillary: 213 mg/dL — ABNORMAL HIGH (ref 65–99)
Glucose-Capillary: 224 mg/dL — ABNORMAL HIGH (ref 65–99)
Glucose-Capillary: 94 mg/dL (ref 65–99)

## 2017-07-23 MED ORDER — DIATRIZOATE MEGLUMINE & SODIUM 66-10 % PO SOLN
90.0000 mL | Freq: Once | ORAL | Status: AC
  Administered 2017-07-23: 90 mL via NASOGASTRIC
  Filled 2017-07-23: qty 90

## 2017-07-23 NOTE — Progress Notes (Signed)
PROGRESS NOTE    Debra [name]  ENI:[id number] DOB: [birth year] DOA: 07/22/2017 PCP: Vicenta Aly, FNP   Brief Narrative:  HPI On 07/22/2017  Debra [name] is a 72 y.o. female with a medical history of diabetes mellitus, hypertension, obesity, who presented to the emergency department with complaints of abdominal pain, nausea, and vomiting which have been ongoing for the past 36 hours.  Patient states she has had a pain in her abdomen which is characterized as achy, nothing seems to help the pain or make it worse.  Patient was able to have 2 bowel movements, loose stools.  Patient had presented to her primary care office today and was then sent to the emergency department.  She currently denies any chest pain, shortness of breath, headache, dizziness, change in urinary frequency, recent travel or illness.  Assessment & Plan   Abdominal pain with nausea and vomiting secondary to small bowel obstruction with a large ventral hernia -CT abdomen pelvis showed large ventral hernia in the pelvis which contains loops of large and small bowel causing obstruction of the more proximal small bowel as well as dilatation of small bowel within the hernia.  No large bowel dilatation is noted. -NG tube placed -Placed on IVF, antiemetics, pain control -today, patient feels abdominal pain, N/V have improved, and continues to have diarrhea -General surgery consulted and appreciated- feels surgery for hernia repair I snot needed at this time as hernia is reducible and patient is no longer clinically obstructed. Plans to clamp NG tube and start liquids today; continue small bowel protocol  Acute kidney injury -Suspect secondary to poor oral intake, vomiting -Creatinine appears to be 0.7-0.8 from 2017 to 2018, upon admission, creatinine 1.29 -placed on IVF, creatinine improving to 0.91 today -continue to monitor BMP  Diabetes mellitus, type II -Last hemoglobin A1c 7.6 on 05/22/2016 -Metformin, Novolin  held -Continue ISS and CBG monitoring   Essential hypertension -Hold Lasix and metoprolol orally,  -Continue  2.5 mg IV metoprolol every 6 hours with holding parameters  GERD -Hold omeprazole  Hyperlipidemia -Hold statin  Morbid obesity -BMI 45.64 -will need nutrition consult when improved  DVT Prophylaxis  heparin  Code Status: Full  Family Communication: None at bedside  Disposition Plan: admitted, pending improvement  Consultants General surgery  Procedures  None  Antibiotics   Anti-infectives (From admission, onward)   None      Subjective:   Debra [name] seen and examined today.  Feels abdominal pain has improved. Continues to have diarrhea. Denies current nausea, vomiting. Denies chest pain, shortness of breath, dizziness, headache.   Objective:   Vitals:   07/22/17 1918 07/22/17 2041 07/23/17 0500 07/23/17 0609  BP: 139/79 (!) 146/77 (!) 145/77 (!) 103/43  Pulse: 83 89  72  Resp: 18 15  17   Temp: 98 F (36.7 C) (!) 97.4 F (36.3 C)  98.2 F (36.8 C)  TempSrc: Oral Oral  Oral  SpO2: 96% 96%  95%  Weight:      Height:        Intake/Output Summary (Last 24 hours) at 07/23/2017 1345 Last data filed at 07/23/2017 0600 Gross per 24 hour  Intake 675 ml  Output 352 ml  Net 323 ml   Filed Weights   07/22/17 1310  Weight: 120.7 kg (266 lb)    Exam  General: Well developed, well nourished, NAD, appears stated age  HEENT: NCAT, mucous membranes moist. +NG tube  Neck: Supple  Cardiovascular: S1 S2 auscultated, 2/6 SEM, RRR  Respiratory: Clear to auscultation bilaterally with equal chest rise  Abdomen: Soft, obese, nontender, nondistended, + bowel sounds, +hernia  Extremities: warm dry without cyanosis clubbing or edema  Neuro: AAOx3, nonfocal  Psych: Normal affect and demeanor with intact judgement and insight   Data Reviewed: I have personally reviewed following labs and imaging studies  CBC: Recent Labs  Lab 07/22/17 1332  07/22/17 1911 07/23/17 0506  WBC 11.5* 9.8 7.7  HGB 15.5* 15.8* 14.2  HCT 45.3 47.3* 42.9  MCV 90.6 90.1 90.9  PLT 287 279 237   Basic Metabolic Panel: Recent Labs  Lab 07/22/17 1332 07/22/17 1911 07/23/17 0506  NA 140  --  143  K 4.6  --  3.6  CL 102  --  108  CO2 24  --  24  GLUCOSE 377*  --  145*  BUN 25*  --  29*  CREATININE 1.29* 1.12* 0.91  CALCIUM 9.1  --  8.3*   GFR: Estimated Creatinine Clearance: 73.6 mL/min (by C-G formula based on SCr of 0.91 mg/dL). Liver Function Tests: Recent Labs  Lab 07/22/17 1332 07/23/17 0506  AST 24 20  ALT 20 17  ALKPHOS 87 68  BILITOT 1.2 1.0  PROT 7.6 6.7  ALBUMIN 4.0 3.4*   Recent Labs  Lab 07/22/17 1332  LIPASE 23   No results for input(s): AMMONIA in the last 168 hours. Coagulation Profile: No results for input(s): INR, PROTIME in the last 168 hours. Cardiac Enzymes: No results for input(s): CKTOTAL, CKMB, CKMBINDEX, TROPONINI in the last 168 hours. BNP (last 3 results) No results for input(s): PROBNP in the last 8760 hours. HbA1C: No results for input(s): HGBA1C in the last 72 hours. CBG: Recent Labs  Lab 07/22/17 2031 07/22/17 2328 07/23/17 0436 07/23/17 0822 07/23/17 1145  GLUCAP 362* 172* 139* 150* 159*   Lipid Profile: No results for input(s): CHOL, HDL, LDLCALC, TRIG, CHOLHDL, LDLDIRECT in the last 72 hours. Thyroid Function Tests: No results for input(s): TSH, T4TOTAL, FREET4, T3FREE, THYROIDAB in the last 72 hours. Anemia Panel: No results for input(s): VITAMINB12, FOLATE, FERRITIN, TIBC, IRON, RETICCTPCT in the last 72 hours. Urine analysis:    Component Value Date/Time   COLORURINE AMBER (A) 07/22/2017 1507   APPEARANCEUR HAZY (A) 07/22/2017 1507   LABSPEC 1.028 07/22/2017 1507   PHURINE 5.0 07/22/2017 1507   GLUCOSEU >=500 (A) 07/22/2017 1507   GLUCOSEU NEG mg/dL 04/13/2007 0230   HGBUR NEGATIVE 07/22/2017 1507   BILIRUBINUR NEGATIVE 07/22/2017 1507   BILIRUBINUR Neg 01/01/2015 1348    KETONESUR 5 (A) 07/22/2017 1507   PROTEINUR 100 (A) 07/22/2017 1507   UROBILINOGEN 2.0 01/01/2015 1348   UROBILINOGEN 0.2 04/13/2013 0714   NITRITE NEGATIVE 07/22/2017 1507   LEUKOCYTESUR NEGATIVE 07/22/2017 1507   Sepsis Labs: @LABRCNTIP (procalcitonin:4,lacticidven:4)  )No results found for this or any previous visit (from the past 240 hour(s)).    Radiology Studies: Ct Abdomen Pelvis W Contrast  Result Date: 07/22/2017 CLINICAL DATA:  Acute left lower quadrant abdominal pain. EXAM: CT ABDOMEN AND PELVIS WITH CONTRAST TECHNIQUE: Multidetector CT imaging of the abdomen and pelvis was performed using the standard protocol following bolus administration of intravenous contrast. CONTRAST:  52mL ISOVUE-300 IOPAMIDOL (ISOVUE-300) INJECTION 61% COMPARISON:  CT scan of February 15, 2016. FINDINGS: Lower chest: No acute abnormality. Hepatobiliary: Status post cholecystectomy. Stable left-sided pneumobilia is noted. Liver is otherwise unremarkable. No biliary dilatation is noted. Pancreas: Unremarkable. No pancreatic ductal dilatation or surrounding inflammatory changes. Spleen: Normal in size without focal abnormality. Adrenals/Urinary  Tract: Stable left adrenal adenoma. Right adrenal gland appears normal. No hydronephrosis or renal obstruction is noted. No renal or ureteral calculi are noted. Urinary bladder is decompressed. Stomach/Bowel: The stomach appears normal. Status post appendectomy. Diverticulosis of descending and sigmoid colon is noted without inflammation. Large ventral hernia is noted in the pelvis which contains loops of large and small bowel. Transverse colon is nondilated and there for does not appear to be incarcerated. However, there does appear to be dilatation of small-bowel loops within the hernia, as well as the loops proximal to the hernia secondary to obstruction. Vascular/Lymphatic: Aortic atherosclerosis. No enlarged abdominal or pelvic lymph nodes. Reproductive: Status post  hysterectomy. No adnexal masses. Other: No abnormal fluid collection is noted. Musculoskeletal: No acute or significant osseous findings. IMPRESSION: Large ventral hernia is noted in the pelvis which contains loops of large and small bowel. It appears to be causing obstruction of the more proximal small bowel as well as dilatation of the small bowel within the hernia. No large bowel dilatation is noted. Aortic Atherosclerosis (ICD10-I70.0). Electronically Signed   By: Marijo Conception, M.D.   On: 07/22/2017 15:41   Dg Abd Portable 1 View  Result Date: 07/22/2017 CLINICAL DATA:  Evaluate NG tube placement EXAM: PORTABLE ABDOMEN - 1 VIEW COMPARISON:  CT abdomen pelvis 07/22/2017 FINDINGS: Enteric tube tip and side-port project over the stomach. Contrast material excreted in the bilateral renal collecting systems. Mildly gaseous distended loop of small bowel within the central abdomen. IMPRESSION: Enteric tube tip and side-port project over the stomach. Electronically Signed   By: Lovey Newcomer M.D.   On: 07/22/2017 18:21     Scheduled Meds: . heparin  5,000 Units Subcutaneous Q8H  . insulin aspart  0-15 Units Subcutaneous Q4H  . metoprolol tartrate  2.5 mg Intravenous Q6H   Continuous Infusions: . sodium chloride 75 mL/hr at 07/23/17 0544     LOS: 1 day   Time Spent in minutes   45 minutes  Emerly Prak D.O. on 07/23/2017 at 1:45 PM  Between 7am to 7pm - Pager - [phone]  After 7pm go to [email or web address] - password [email or web address]  And look for the night coverage person covering for me after hours  [facility] Office  [phone]

## 2017-07-23 NOTE — Progress Notes (Signed)
  Results for MAUDY, YONAN (MRN [id number]) as of 07/23/2017 13:20  Ref. Range 07/22/2017 23:28 07/23/2017 04:36 07/23/2017 08:22 07/23/2017 11:45  Glucose-Capillary Latest Ref Range: 65 - 99 mg/dL 172 (H) 139 (H) 150 (H) 159 (H)   Spoke with patient about her diabetes. States that she was diagnosed "years ago". Takes NPH 98 units before breakfast and NPH 76 units before supper along with Metformin at home.  Checks her blood sugars twice a day. States that her blood sugars are usually in the 200's. Did not know what her last HgbA1C was, but she states "that it was not good". Given information on general diabetes care, foot care, what she eats at home.  States that she cannot exercise much due to her arthritis in her knees.   Will continue to monitor blood sugars while in the hospital. Patient may need some basal insulin when starting to eat.    Harvel Ricks RN BSN CDE Diabetes Coordinator Pager: [phone]  8am-5pm

## 2017-07-23 NOTE — Progress Notes (Addendum)
Subjective No acute events. Doing well this morning. No abdominal pain. No n/v with NG tube. Having flatus and BMs. Feels "100% better."  Objective: Vital signs in last 24 hours: Temp:  [97.4 F (36.3 C)-98.2 F (36.8 C)] 98.2 F (36.8 C) (05/03 0609) Pulse Rate:  [72-90] 72 (05/03 0609) Resp:  [15-20] 17 (05/03 0609) BP: (103-152)/(43-84) 103/43 (05/03 0609) SpO2:  [92 %-98 %] 95 % (05/03 0609) Weight:  [120.7 kg (266 lb)] 120.7 kg (266 lb) (05/02 1310)    Intake/Output from previous day: 05/02 0701 - 05/03 0700 In: 675 [I.V.:675] Out: 352 [Emesis/NG output:350; Stool:2] Intake/Output this shift: No intake/output data recorded.  Gen: NAD, comfortable CV: RRR Pulm: Normal work of breathing Abd: Morbidly obese, soft, NT/ND; reducible ventral hernia without tenderness to manipulation; no overlying skin changes. Ext: SCDs in place  Lab Results: CBC  Recent Labs    07/22/17 1911 07/23/17 0506  WBC 9.8 7.7  HGB 15.8* 14.2  HCT 47.3* 42.9  PLT 279 252   BMET Recent Labs    07/22/17 1332 07/22/17 1911 07/23/17 0506  NA 140  --  143  K 4.6  --  3.6  CL 102  --  108  CO2 24  --  24  GLUCOSE 377*  --  145*  BUN 25*  --  29*  CREATININE 1.29* 1.12* 0.91  CALCIUM 9.1  --  8.3*   PT/INR No results for input(s): LABPROT, INR in the last 72 hours. ABG No results for input(s): PHART, HCO3 in the last 72 hours.  Invalid input(s): PCO2, PO2  Studies/Results:  Anti-infectives: Anti-infectives (From admission, onward)   None       Assessment/Plan: Patient Active Problem List   Diagnosis Date Noted  . SBO (small bowel obstruction) ([place]) 07/22/2017  . Abnormal LFTs 03/20/2016  . Abdominal pain, left lower quadrant 07/04/2015  . Type 2 diabetes mellitus with diabetic polyneuropathy ([place]) 10/06/2013  . Hyperlipidemia associated with type 2 diabetes mellitus ([place]) 10/06/2013  . Fungal dermatitis 08/11/2013  . Hyperlipidemia 06/09/2013  . Choledocholithiasis  with acute cholecystitis 04/14/2013  . Symptomatic cholelithiasis 04/13/2013  . Neuropathic pain of both legs 04/04/2013  . Severe obesity (BMI >= 40) ([place]) 11/28/2012  . Obesity (BMI 30-39.9) 11/28/2012  . Diabetes mellitus with neurological manifestation ([place])   . Hypertension goal BP (blood pressure) < 130/80   . Osteopenia   . Chronic venous insufficiency   . Leg edema   . Hypertensive retinopathy, grade 2 03/30/2012  . Medial epicondylitis 02/11/2012  . Superficial thrombophlebitis of left leg 07/15/2011  . GERD (gastroesophageal reflux disease) 02/25/2011  . Right knee pain 02/16/2011  . Preventive measure 09/11/2010  . Neck pain 07/03/2010  . OSTEOPENIA 04/12/2006  . Type 2 diabetes, uncontrolled, with neuropathy ([place]) 03/08/2006  . MORBID OBESITY 03/08/2006  . Essential hypertension 03/08/2006   Debra [name] is 48yoF admitted with ventral hernia and concerns on imaging for SBO at/near hernia -Her abdominal exam is benign and she has a reducible hernia; she is not clinically obstructed today and her symptoms have completely resolved. She is having flatus and BMs. She is hungry. At this point, I do not think she will benefit from surgery. -Based on her CT, it's also likely her SBO was 2/2 adhesions as opposed to the hernia given the wide neck and reducibility on exam -The anatomy and physiology of the GI tract and abdominal wall was discussed at length with the patient with associated illustrations. The pathophysiology of hernias  was discussed at length with associated pictures. We discussed that in the absence of an active obstruction from her hernia at this time she would not benefit from surgery and she is in agreement. We discussed the risks of surgery including high risk of recurrence with her BMI. -Will plan to clamp NG tube and start on liquids today; small bowel protocol ordered   LOS: 1 day   Sharon Mt. Dema Severin, M.D. [facility]  Note: This dictation  was prepared with Dragon/digital dictation along with [employer]. Any transcriptional errors that result from this process are unintentional.

## 2017-07-24 LAB — BASIC METABOLIC PANEL
ANION GAP: 9 (ref 5–15)
BUN: 16 mg/dL (ref 6–20)
CALCIUM: 8 mg/dL — AB (ref 8.9–10.3)
CO2: 22 mmol/L (ref 22–32)
Chloride: 108 mmol/L (ref 101–111)
Creatinine, Ser: 0.73 mg/dL (ref 0.44–1.00)
Glucose, Bld: 179 mg/dL — ABNORMAL HIGH (ref 65–99)
Potassium: 3.3 mmol/L — ABNORMAL LOW (ref 3.5–5.1)
Sodium: 139 mmol/L (ref 135–145)

## 2017-07-24 LAB — GLUCOSE, CAPILLARY
GLUCOSE-CAPILLARY: 192 mg/dL — AB (ref 65–99)
GLUCOSE-CAPILLARY: 140 mg/dL — AB (ref 65–99)
Glucose-Capillary: 277 mg/dL — ABNORMAL HIGH (ref 65–99)
Glucose-Capillary: 177 mg/dL — ABNORMAL HIGH (ref 65–99)
Glucose-Capillary: 194 mg/dL — ABNORMAL HIGH (ref 65–99)

## 2017-07-24 MED ORDER — POTASSIUM CHLORIDE CRYS ER 20 MEQ PO TBCR
40.0000 meq | EXTENDED_RELEASE_TABLET | Freq: Once | ORAL | Status: AC
  Administered 2017-07-24: 40 meq via ORAL
  Filled 2017-07-24: qty 2

## 2017-07-24 MED ORDER — INSULIN ASPART 100 UNIT/ML ~~LOC~~ SOLN
0.0000 [IU] | Freq: Three times a day (TID) | SUBCUTANEOUS | Status: DC
  Administered 2017-07-25: 3 [IU] via SUBCUTANEOUS

## 2017-07-24 MED ORDER — SIMETHICONE 80 MG PO CHEW
160.0000 mg | CHEWABLE_TABLET | Freq: Four times a day (QID) | ORAL | Status: DC | PRN
  Administered 2017-07-24: 160 mg via ORAL
  Filled 2017-07-24: qty 2

## 2017-07-24 NOTE — Progress Notes (Signed)
PROGRESS NOTE    Debra [name]  UXL:[id number] DOB: 05-18-1945 DOA: 07/22/2017 PCP: Vicenta Aly, FNP   Brief Narrative:  HPI On 07/22/2017  Debra [name] is a 72 y.o. female with a medical history of diabetes mellitus, hypertension, obesity, who presented to the emergency department with complaints of abdominal pain, nausea, and vomiting which have been ongoing for the past 36 hours.  Patient states she has had a pain in her abdomen which is characterized as achy, nothing seems to help the pain or make it worse.  Patient was able to have 2 bowel movements, loose stools.  Patient had presented to her primary care office today and was then sent to the emergency department.  She currently denies any chest pain, shortness of breath, headache, dizziness, change in urinary frequency, recent travel or illness.  Assessment & Plan   Abdominal pain with nausea and vomiting secondary to small bowel obstruction with a large ventral hernia -CT abdomen pelvis showed large ventral hernia in the pelvis which contains loops of large and small bowel causing obstruction of the more proximal small bowel as well as dilatation of small bowel within the hernia.  No large bowel dilatation is noted. -NG tube placed- to be discontinued today -Placed on IVF, antiemetics, pain control -today, patient feels abdominal pain, N/V have improved, and continues to have diarrhea -General surgery consulted and appreciated- feels surgery for hernia repair I snot needed at this time as hernia is reducible and patient is no longer clinically obstructed.  continue small bowel protocol- showed contrast in the ascending colon, no evidence of bowel obstruction  -NG tube clamped on 5/3, started on clear liquids and tolerated well  -General surgery will discontinue NG tube today and place on soft diet  Acute kidney injury -Suspect secondary to poor oral intake, vomiting -Creatinine appears to be 0.7-0.8 from 2017 to 2018, upon  admission, creatinine 1.29 -placed on IVF, creatinine improving to 0.73 today -continue to monitor BMP  Diabetes mellitus, type II -Last hemoglobin A1c 7.6 on 05/22/2016 -Metformin, Novolin held -Continue ISS and CBG monitoring   Essential hypertension -Hold Lasix and metoprolol orally -Continue  2.5 mg IV metoprolol every 6 hours with holding parameters  GERD -Hold omeprazole  Hyperlipidemia -Hold statin  Hypokalemia -will replace potassium and monitor BMP  Morbid obesity -BMI 45.64; will need outpatient follow up for weight management  DVT Prophylaxis  heparin  Code Status: Full  Family Communication: None at bedside  Disposition Plan: admitted, pending improvement- home likely on 5/5  Consultants General surgery  Procedures  None  Antibiotics   Anti-infectives (From admission, onward)   None      Subjective:   Debra [name] seen and examined today.  Feels abdominal pain is improved.  Denies current nausea, vomiting, but continues to have bowel movements.  Denies current chest pain, shortness breath, dizziness or headache.  Objective:   Vitals:   07/23/17 0500 07/23/17 0609 07/23/17 2025 07/24/17 0537  BP: (!) 145/77 (!) 103/43 136/77 129/75  Pulse:  72 77 93  Resp:  17 14 14   Temp:  98.2 F (36.8 C) 97.8 F (36.6 C) 98.2 F (36.8 C)  TempSrc:  Oral Oral Oral  SpO2:  95% 100% 96%  Weight:      Height:        Intake/Output Summary (Last 24 hours) at 07/24/2017 1412 Last data filed at 07/24/2017 0600 Gross per 24 hour  Intake 900 ml  Output -  Net 900 ml  Filed Weights   07/22/17 1310  Weight: 120.7 kg (266 lb)   Exam  General: Well developed, well nourished, NAD, appears stated age  53: NCAT, mucous membranes moist.   Neck: Supple  Cardiovascular: S1 S2 auscultated, 2/6 SEM, RRR  Respiratory: Clear to auscultation bilaterally with equal chest rise  Abdomen: Soft, obese, nontender, nondistended, + bowel sounds, large  hernia  Extremities: warm dry without cyanosis clubbing or edema  Neuro: AAOx3, nonfocal  Psych: Normal affect and demeanor with intact judgement and insight, pleasant  Data Reviewed: I have personally reviewed following labs and imaging studies  CBC: Recent Labs  Lab 07/22/17 1332 07/22/17 1911 07/23/17 0506  WBC 11.5* 9.8 7.7  HGB 15.5* 15.8* 14.2  HCT 45.3 47.3* 42.9  MCV 90.6 90.1 90.9  PLT 287 279 466   Basic Metabolic Panel: Recent Labs  Lab 07/22/17 1332 07/22/17 1911 07/23/17 0506 07/24/17 0454  NA 140  --  143 139  K 4.6  --  3.6 3.3*  CL 102  --  108 108  CO2 24  --  24 22  GLUCOSE 377*  --  145* 179*  BUN 25*  --  29* 16  CREATININE 1.29* 1.12* 0.91 0.73  CALCIUM 9.1  --  8.3* 8.0*   GFR: Estimated Creatinine Clearance: 83.8 mL/min (by C-G formula based on SCr of 0.73 mg/dL). Liver Function Tests: Recent Labs  Lab 07/22/17 1332 07/23/17 0506  AST 24 20  ALT 20 17  ALKPHOS 87 68  BILITOT 1.2 1.0  PROT 7.6 6.7  ALBUMIN 4.0 3.4*   Recent Labs  Lab 07/22/17 1332  LIPASE 23   No results for input(s): AMMONIA in the last 168 hours. Coagulation Profile: No results for input(s): INR, PROTIME in the last 168 hours. Cardiac Enzymes: No results for input(s): CKTOTAL, CKMB, CKMBINDEX, TROPONINI in the last 168 hours. BNP (last 3 results) No results for input(s): PROBNP in the last 8760 hours. HbA1C: No results for input(s): HGBA1C in the last 72 hours. CBG: Recent Labs  Lab 07/23/17 1934 07/23/17 2356 07/24/17 0403 07/24/17 0803 07/24/17 1147  GLUCAP 213* 94 192* 140* 277*   Lipid Profile: No results for input(s): CHOL, HDL, LDLCALC, TRIG, CHOLHDL, LDLDIRECT in the last 72 hours. Thyroid Function Tests: No results for input(s): TSH, T4TOTAL, FREET4, T3FREE, THYROIDAB in the last 72 hours. Anemia Panel: No results for input(s): VITAMINB12, FOLATE, FERRITIN, TIBC, IRON, RETICCTPCT in the last 72 hours. Urine analysis:    Component Value  Date/Time   COLORURINE AMBER (A) 07/22/2017 1507   APPEARANCEUR HAZY (A) 07/22/2017 1507   LABSPEC 1.028 07/22/2017 1507   PHURINE 5.0 07/22/2017 1507   GLUCOSEU >=500 (A) 07/22/2017 1507   GLUCOSEU NEG mg/dL 04/13/2007 0230   HGBUR NEGATIVE 07/22/2017 1507   BILIRUBINUR NEGATIVE 07/22/2017 1507   BILIRUBINUR Neg 01/01/2015 1348   KETONESUR 5 (A) 07/22/2017 1507   PROTEINUR 100 (A) 07/22/2017 1507   UROBILINOGEN 2.0 01/01/2015 1348   UROBILINOGEN 0.2 04/13/2013 0714   NITRITE NEGATIVE 07/22/2017 1507   LEUKOCYTESUR NEGATIVE 07/22/2017 1507   Sepsis Labs: @LABRCNTIP (procalcitonin:4,lacticidven:4)  )No results found for this or any previous visit (from the past 240 hour(s)).    Radiology Studies: Ct Abdomen Pelvis W Contrast  Result Date: 07/22/2017 CLINICAL DATA:  Acute left lower quadrant abdominal pain. EXAM: CT ABDOMEN AND PELVIS WITH CONTRAST TECHNIQUE: Multidetector CT imaging of the abdomen and pelvis was performed using the standard protocol following bolus administration of intravenous contrast. CONTRAST:  55mL  ISOVUE-300 IOPAMIDOL (ISOVUE-300) INJECTION 61% COMPARISON:  CT scan of February 15, 2016. FINDINGS: Lower chest: No acute abnormality. Hepatobiliary: Status post cholecystectomy. Stable left-sided pneumobilia is noted. Liver is otherwise unremarkable. No biliary dilatation is noted. Pancreas: Unremarkable. No pancreatic ductal dilatation or surrounding inflammatory changes. Spleen: Normal in size without focal abnormality. Adrenals/Urinary Tract: Stable left adrenal adenoma. Right adrenal gland appears normal. No hydronephrosis or renal obstruction is noted. No renal or ureteral calculi are noted. Urinary bladder is decompressed. Stomach/Bowel: The stomach appears normal. Status post appendectomy. Diverticulosis of descending and sigmoid colon is noted without inflammation. Large ventral hernia is noted in the pelvis which contains loops of large and small bowel. Transverse  colon is nondilated and there for does not appear to be incarcerated. However, there does appear to be dilatation of small-bowel loops within the hernia, as well as the loops proximal to the hernia secondary to obstruction. Vascular/Lymphatic: Aortic atherosclerosis. No enlarged abdominal or pelvic lymph nodes. Reproductive: Status post hysterectomy. No adnexal masses. Other: No abnormal fluid collection is noted. Musculoskeletal: No acute or significant osseous findings. IMPRESSION: Large ventral hernia is noted in the pelvis which contains loops of large and small bowel. It appears to be causing obstruction of the more proximal small bowel as well as dilatation of the small bowel within the hernia. No large bowel dilatation is noted. Aortic Atherosclerosis (ICD10-I70.0). Electronically Signed   By: Marijo Conception, M.D.   On: 07/22/2017 15:41   Dg Abd Portable 1v-small Bowel Obstruction Protocol-initial, 8 Hr Delay  Result Date: 07/23/2017 CLINICAL DATA:  Small bowel obstruction. EXAM: PORTABLE ABDOMEN - 1 VIEW COMPARISON:  07/22/2017, 1752 hours FINDINGS: NG tube in stomach. There is oral contrast within the ascending colon. No dilated loops of large or small bowel. No initial film provided. Comparison 24 hours prior. IMPRESSION: Contrast within the ascending colon. No evidence of bowel obstruction. Electronically Signed   By: Suzy Bouchard M.D.   On: 07/23/2017 20:15   Dg Abd Portable 1 View  Result Date: 07/22/2017 CLINICAL DATA:  Evaluate NG tube placement EXAM: PORTABLE ABDOMEN - 1 VIEW COMPARISON:  CT abdomen pelvis 07/22/2017 FINDINGS: Enteric tube tip and side-port project over the stomach. Contrast material excreted in the bilateral renal collecting systems. Mildly gaseous distended loop of small bowel within the central abdomen. IMPRESSION: Enteric tube tip and side-port project over the stomach. Electronically Signed   By: Lovey Newcomer M.D.   On: 07/22/2017 18:21     Scheduled Meds: .  heparin  5,000 Units Subcutaneous Q8H  . insulin aspart  0-15 Units Subcutaneous Q4H  . metoprolol tartrate  2.5 mg Intravenous Q6H   Continuous Infusions: . sodium chloride 75 mL/hr at 07/24/17 0600     LOS: 2 days   Time Spent in minutes   30 minutes  Halley Shepheard D.O. on 07/24/2017 at 2:12 PM  Between 7am to 7pm - Pager - [phone]  After 7pm go to [email or web address] - password [email or web address]  And look for the night coverage person covering for me after hours  [facility] Office  [phone]

## 2017-07-24 NOTE — Progress Notes (Signed)
Subjective No acute events. Doing well. No abdominal pain. No n/v with NG tube clamped. Having flatus and BMs. Feels "100% better."  Objective: Vital signs in last 24 hours: Temp:  [97.8 F (36.6 C)-98.2 F (36.8 C)] 98.2 F (36.8 C) (05/04 0537) Pulse Rate:  [77-93] 93 (05/04 0537) Resp:  [14] 14 (05/04 0537) BP: (129-136)/(75-77) 129/75 (05/04 0537) SpO2:  [96 %-100 %] 96 % (05/04 0537)    Intake/Output from previous day: 05/03 0701 - 05/04 0700 In: 1650 [I.V.:1650] Out: -  Intake/Output this shift: No intake/output data recorded.  Gen: NAD, comfortable CV: RRR Pulm: Normal work of breathing Abd: Morbidly obese, soft, NT/ND; reducible ventral hernia without tenderness to manipulation; no overlying skin changes. Ext: SCDs in place  Lab Results: CBC  Recent Labs    07/22/17 1911 07/23/17 0506  WBC 9.8 7.7  HGB 15.8* 14.2  HCT 47.3* 42.9  PLT 279 252   BMET Recent Labs    07/23/17 0506 07/24/17 0454  NA 143 139  K 3.6 3.3*  CL 108 108  CO2 24 22  GLUCOSE 145* 179*  BUN 29* 16  CREATININE 0.91 0.73  CALCIUM 8.3* 8.0*   PT/INR No results for input(s): LABPROT, INR in the last 72 hours. ABG No results for input(s): PHART, HCO3 in the last 72 hours.  Invalid input(s): PCO2, PO2  Studies/Results:  Anti-infectives: Anti-infectives (From admission, onward)   None       Assessment/Plan: Patient Active Problem List   Diagnosis Date Noted  . SBO (small bowel obstruction) ([place]) 07/22/2017  . Abnormal LFTs 03/20/2016  . Abdominal pain, left lower quadrant 07/04/2015  . Type 2 diabetes mellitus with diabetic polyneuropathy ([place]) 10/06/2013  . Hyperlipidemia associated with type 2 diabetes mellitus ([place]) 10/06/2013  . Fungal dermatitis 08/11/2013  . Hyperlipidemia 06/09/2013  . Choledocholithiasis with acute cholecystitis 04/14/2013  . Symptomatic cholelithiasis 04/13/2013  . Neuropathic pain of both legs 04/04/2013  . Severe obesity (BMI >= 40)  ([place]) 11/28/2012  . Obesity (BMI 30-39.9) 11/28/2012  . Diabetes mellitus with neurological manifestation ([place])   . Hypertension goal BP (blood pressure) < 130/80   . Osteopenia   . Chronic venous insufficiency   . Leg edema   . Hypertensive retinopathy, grade 2 03/30/2012  . Medial epicondylitis 02/11/2012  . Superficial thrombophlebitis of left leg 07/15/2011  . GERD (gastroesophageal reflux disease) 02/25/2011  . Right knee pain 02/16/2011  . Preventive measure 09/11/2010  . Neck pain 07/03/2010  . OSTEOPENIA 04/12/2006  . Type 2 diabetes, uncontrolled, with neuropathy ([place]) 03/08/2006  . MORBID OBESITY 03/08/2006  . Essential hypertension 03/08/2006   Ms. Elliff is 69yoF admitted with ventral hernia and concerns on imaging for SBO at/near hernia -Her abdominal exam is benign and she has a reducible hernia; she is not clinically obstructed today and her symptoms have completely resolved. She is having flatus and BMs. She is hungry. At this point, I do not think she will benefit from surgery. -Based on her CT, it's also likely her SBO was 2/2 adhesions as opposed to the hernia given the wide neck and reducibility on exam  -Will plan to d/c NG tube and advance diet today with possible d/c tom if she does well   LOS: 2 days   .Debra Adie, MD  [facility]

## 2017-07-24 NOTE — Progress Notes (Signed)
Subjective: Patient states she is doing better today. She states that she is not having any abdominal pain. Endorses a mild headache this morning but has subsided since given medication. She is doing well on the clear liquid diet but says she is ready for more substantial food.Only complaint is mild irritation at the NG tube site.  Reports loose stools that are improved from yesterday. She is passing flatus and is not having any difficulties with ambulation. Denies N/V.   Objective: Vital signs in last 24 hours: Temp:  [97.8 F (36.6 C)-98.2 F (36.8 C)] 98.2 F (36.8 C) (05/04 0537) Pulse Rate:  [77-93] 93 (05/04 0537) Resp:  [14] 14 (05/04 0537) BP: (129-136)/(75-77) 129/75 (05/04 0537) SpO2:  [96 %-100 %] 96 % (05/04 0537)    Intake/Output from previous day: 05/03 0701 - 05/04 0700 In: 1650 [I.V.:1650] Out: -  Intake/Output this shift: No intake/output data recorded.  General appearance: alert, cooperative and no distress Nose: Nares normal. Septum midline. Mucosa normal. No drainage or sinus tenderness., mild irritation at NG tube site Resp: clear to auscultation bilaterally Cardio: regular rate and rhythm GI: abnormal findings:  reducible ventral hernia without tenderness to palpation. No skin changes around hernia.   Lab Results:  Results for orders placed or performed during the hospital encounter of 07/22/17 (from the past 24 hour(s))  Glucose, capillary     Status: Abnormal   Collection Time: 07/23/17  8:22 AM  Result Value Ref Range   Glucose-Capillary 150 (H) 65 - 99 mg/dL  Glucose, capillary     Status: Abnormal   Collection Time: 07/23/17 11:45 AM  Result Value Ref Range   Glucose-Capillary 159 (H) 65 - 99 mg/dL  Glucose, capillary     Status: Abnormal   Collection Time: 07/23/17  3:55 PM  Result Value Ref Range   Glucose-Capillary 224 (H) 65 - 99 mg/dL  Glucose, capillary     Status: Abnormal   Collection Time: 07/23/17  7:34 PM  Result Value Ref Range   Glucose-Capillary 213 (H) 65 - 99 mg/dL  Glucose, capillary     Status: None   Collection Time: 07/23/17 11:56 PM  Result Value Ref Range   Glucose-Capillary 94 65 - 99 mg/dL  Glucose, capillary     Status: Abnormal   Collection Time: 07/24/17  4:03 AM  Result Value Ref Range   Glucose-Capillary 192 (H) 65 - 99 mg/dL  Basic metabolic panel     Status: Abnormal   Collection Time: 07/24/17  4:54 AM  Result Value Ref Range   Sodium 139 135 - 145 mmol/L   Potassium 3.3 (L) 3.5 - 5.1 mmol/L   Chloride 108 101 - 111 mmol/L   CO2 22 22 - 32 mmol/L   Glucose, Bld 179 (H) 65 - 99 mg/dL   BUN 16 6 - 20 mg/dL   Creatinine, Ser 0.73 0.44 - 1.00 mg/dL   Calcium 8.0 (L) 8.9 - 10.3 mg/dL   GFR calc non Af Amer >60 >60 mL/min   GFR calc Af Amer >60 >60 mL/min   Anion gap 9 5 - 15  Glucose, capillary     Status: Abnormal   Collection Time: 07/24/17  8:03 AM  Result Value Ref Range   Glucose-Capillary 140 (H) 65 - 99 mg/dL     Studies/Results Radiology     MEDS, Scheduled . heparin  5,000 Units Subcutaneous Q8H  . insulin aspart  0-15 Units Subcutaneous Q4H  . metoprolol tartrate  2.5 mg Intravenous Q6H  Assessment: Patient is a well-appearing 72 y.o female who was admitted for low midline ventral hernia and SBO on CT scan. She is doing well and is without pain. Nausea and diarrhea has subsided. She is passing flatus and ambulating. Doing well on clear liquids but desires to be advanced for more substantial food. Her hernia is stable and benign at the moment.   Plan: - Discontinue NG tube - Advance diet as tolerated - Encourage continued ambulation - Continue pain medication and anti-emetics PRN    LOS: 2 days    Tijana Walder PA-S   07/24/2017 8:10 AM

## 2017-07-25 LAB — BASIC METABOLIC PANEL
Anion gap: 8 (ref 5–15)
BUN: 9 mg/dL (ref 6–20)
CHLORIDE: 109 mmol/L (ref 101–111)
CO2: 23 mmol/L (ref 22–32)
CREATININE: 0.62 mg/dL (ref 0.44–1.00)
Calcium: 7.9 mg/dL — ABNORMAL LOW (ref 8.9–10.3)
GFR calc Af Amer: >60 mL/min (ref >60)
GFR calc non Af Amer: >60 mL/min (ref >60)
GLUCOSE: 218 mg/dL — AB (ref 65–99)
Potassium: 3.8 mmol/L (ref 3.5–5.1)
SODIUM: 140 mmol/L (ref 135–145)

## 2017-07-25 LAB — GLUCOSE, CAPILLARY: Glucose-Capillary: 183 mg/dL — ABNORMAL HIGH (ref 65–99)

## 2017-07-25 MED ORDER — METFORMIN HCL 500 MG PO TABS: 500.0000 mg | ORAL_TABLET | Freq: Every day | ORAL | 5 refills | Status: DC

## 2017-07-25 NOTE — Progress Notes (Addendum)
  Subjective: Patient states she is continuing to improve today and is ready to go home. She is currently doing well on a soft diet and is without nausea or vomiting. Bowel movements have started to take form and are returning to normal. She is passing flatus. Denies N/V. No difficulties with ambulation or PO intake.   Objective: Vital signs in last 24 hours: Temp:  [98.4 F (36.9 C)-99.7 F (37.6 C)] 98.4 F (36.9 C) (05/05 0608) Pulse Rate:  [84-89] 89 (05/05 0608) Resp:  [16-20] 16 (05/05 0608) BP: (155-169)/(78-82) 169/78 (05/05 0608) SpO2:  [95 %-100 %] 95 % (05/05 0608)    Intake/Output from previous day: 05/04 0701 - 05/05 0700 In: 600 [I.V.:600] Out: 100 [Urine:100] Intake/Output this shift: No intake/output data recorded.  General appearance: alert, cooperative and no distress Resp: clear to auscultation bilaterally Cardio: regular rate and rhythm GI: soft, non-tender; bowel sounds normal; no masses,  no organomegaly  Lab Results:  Results for orders placed or performed during the hospital encounter of 07/22/17 (from the past 24 hour(s))  Glucose, capillary     Status: Abnormal   Collection Time: 07/24/17 11:47 AM  Result Value Ref Range   Glucose-Capillary 277 (H) 65 - 99 mg/dL  Glucose, capillary     Status: Abnormal   Collection Time: 07/24/17  4:32 PM  Result Value Ref Range   Glucose-Capillary 177 (H) 65 - 99 mg/dL  Glucose, capillary     Status: Abnormal   Collection Time: 07/24/17 10:30 PM  Result Value Ref Range   Glucose-Capillary 194 (H) 65 - 99 mg/dL  Basic metabolic panel     Status: Abnormal   Collection Time: 07/25/17  4:20 AM  Result Value Ref Range   Sodium 140 135 - 145 mmol/L   Potassium 3.8 3.5 - 5.1 mmol/L   Chloride 109 101 - 111 mmol/L   CO2 23 22 - 32 mmol/L   Glucose, Bld 218 (H) 65 - 99 mg/dL   BUN 9 6 - 20 mg/dL   Creatinine, Ser 0.62 0.44 - 1.00 mg/dL   Calcium 7.9 (L) 8.9 - 10.3 mg/dL   GFR calc non Af Amer >60 >60 mL/min   GFR  calc Af Amer >60 >60 mL/min   Anion gap 8 5 - 15  Glucose, capillary     Status: Abnormal   Collection Time: 07/25/17  7:32 AM  Result Value Ref Range   Glucose-Capillary 183 (H) 65 - 99 mg/dL   Comment 1 Notify RN    Comment 2 Document in Chart      Studies/Results Radiology     MEDS, Scheduled . heparin  5,000 Units Subcutaneous Q8H  . insulin aspart  0-15 Units Subcutaneous TID PC & HS  . metoprolol tartrate  2.5 mg Intravenous Q6H     Assessment: Patient is a well-appearing 72 y.o female who was admitted for low midline ventral hernia and SBO on CT scan. She is doing well and is ready to go home today. She is doing well on the soft diet and is without nausea or vomiting. She is in no pain. BMs are starting to normalize and she is passing flatus. She is ambulating without difficulty.    Plan: - Discharge home today  - Resume home medications   LOS: 3 days    [facility], PA-S  07/25/2017 8:22 AM

## 2017-07-25 NOTE — Discharge Summary (Signed)
Physician Discharge Summary  Debra [name] BMW:[id number] DOB: Nov 27, 1945 DOA: 07/22/2017  PCP: Vicenta Aly, FNP  Admit date: 07/22/2017 Discharge date: 07/25/2017  Time spent: 45 minutes  Recommendations for Outpatient Follow-up:  Patient will be discharged to home.  Patient will need to follow up with primary care provider within one week of discharge, repeat BMP.  Follow up with general surgery in 4 weeks. Patient should continue medications as prescribed.  Patient should follow a soft diet. Restart metformin on 07/26/17.  Discharge Diagnoses:  Abdominal pain with nausea and vomiting secondary to small bowel obstruction with a large ventral hernia Acute kidney injury Diabetes mellitus, type II Essential hypertension GERD Hyperlipidemia Hypokalemia Morbid obesity  Discharge Condition: Stable  Diet recommendation: soft  Filed Weights   07/22/17 1310  Weight: 120.7 kg (266 lb)    History of present illness:  On 07/22/2017  Debra L Tayloris a 72 y.o.femalewith a medical history ofdiabetes mellitus, hypertension, obesity, who presented to the emergency department with complaints of abdominal pain,nausea, and vomiting which have been ongoing for the past 36 hours. Patient states she has had a pain in her abdomen which is characterized as achy, nothing seems to help the pain or make it worse. Patient was able to have 2 bowel movements, loose stools. Patient had presented to her primary care office today and was then sent to the emergency department. She currently denies any chest pain, shortness of breath, headache, dizziness, change in urinary frequency, recent travel or illness.  Hospital Course:  Abdominal pain with nausea and vomiting secondary to small bowel obstruction with a large ventral hernia -CT abdomen pelvis showed large ventral hernia in the pelvis which contains loops of large and small bowel causing obstruction of the more proximal small bowel as well as  dilatation of small bowel within the hernia. No large bowel dilatation is noted. -NG tube placed- to be discontinued today -Placed on IVF, antiemetics, pain control -today, patient feels abdominal pain, N/V have improved, and continues to have diarrhea -General surgery consulted and appreciated- feels surgery for hernia repair I snot needed at this time as hernia is reducible and patient is no longer clinically obstructed.  continue small bowel protocol- showed contrast in the ascending colon, no evidence of bowel obstruction  -NG tube removed, tolerated soft diet -follow up with general surgery in 4 weeks  Acute kidney injury -Resolved -Suspect secondary to poor oral intake, vomiting -Creatinine appears to be 0.7-0.8 from 2017to2018,upon admission, creatinine 1.29 -placed on IVF, creatinine improving to 0.62 today  Diabetes mellitus, type II -Last hemoglobin A1c 7.6 on 05/22/2016 -Metformin, Novolin- may resume on discharge (restart metformin on 5/6 due to IV contrast)  Essential hypertension -Continue metoprolol and lasix at discharge  GERD -Continue omeprazole  Hyperlipidemia -Continue statin  Hypokalemia -Resolved, repeat BMP in one week  Morbid obesity -BMI 45.64; will need outpatient follow up for weight management  Procedures: None  Consultations: General surgery  Discharge Exam: Vitals:   07/24/17 1458 07/25/17 0608  BP: (!) 155/82 (!) 169/78  Pulse: 84 89  Resp: 20 16  Temp: 99.7 F (37.6 C) 98.4 F (36.9 C)  SpO2: 100% 95%   Patient feeling much better today.  Denies current abdominal pain, nausea vomiting or diarrhea.  Feels her stools are more formed today.  Denies current chest pain, shortness of breath, dizziness or headache.  Would like to go home.   General: Well developed, well nourished, NAD, appears stated age  HEENT: NCAT, Pmucous membranes moist.  Neck: Supple  Cardiovascular: S1 S2 auscultated, RRR, 2/6 SEM  Respiratory:  Clear to auscultation bilaterally with equal chest rise  Abdomen: Soft, obese, nontender, nondistended, + bowel sounds  Extremities: warm dry without cyanosis clubbing or edema  Neuro: AAOx3, nonfocal  Psych: Normal affect and demeanor with intact judgement and insight  Discharge Instructions Discharge Instructions    Discharge instructions   Complete by:  As directed    Patient will be discharged to home.  Patient will need to follow up with primary care provider within one week of discharge, repeat BMP.  Follow up with general surgery in 4 weeks. Patient should continue medications as prescribed.  Patient should follow a soft diet. Restart metformin on 07/26/17.     Allergies as of 07/25/2017      Reactions   Aspirin Nausea Only   And causes bruises   Banana Hives   Hydrocodone Hives   Neomycin-polymyxin-gramicidin Itching, Swelling   Eye drops caused swelling in face and rash and itching on arms and neck   Penicillins Hives, Itching   Has patient had a PCN reaction causing immediate rash, facial/tongue/throat swelling, SOB or lightheadedness with hypotension: Yes Has patient had a PCN reaction causing severe rash involving mucus membranes or skin necrosis: No Has patient had a PCN reaction that required hospitalization: Yes Has patient had a PCN reaction occurring within the last 10 years: Yes If all of the above answers are "NO", then may proceed with Cephalosporin use.   Tramadol Nausea And Vomiting   Voltaren [diclofenac Sodium] Nausea And Vomiting   Latex Hives, Rash      Medication List    TAKE these medications   atorvastatin 40 MG tablet Commonly known as:  LIPITOR TAKE 1 TABLET BY MOUTH EVERY DAY What changed:    how much to take  how to take this  when to take this   celecoxib 200 MG capsule Commonly known as:  CELEBREX Take 200 mg by mouth 2 (two) times daily.   diphenhydrAMINE 25 MG tablet Commonly known as:  BENADRYL Take 25 mg by mouth at bedtime as  needed for itching.   furosemide 20 MG tablet Commonly known as:  LASIX TAKE 1 TABLET(20 MG) BY MOUTH DAILY FOR SWELLING   glucose blood test strip Commonly known as:  ACCU-CHEK AVIVA PLUS Ell.65 check blood sugar twice daily as directed   insulin NPH Human 100 UNIT/ML injection Commonly known as:  HUMULIN N,NOVOLIN N Inject 76-98 Units into the skin See admin instructions. Injects 98 units before breakfast and 76 units before supper.   Insulin Pen Needle 32G X 4 MM Misc Commonly known as:  BD PEN NEEDLE NANO U/F Use once daily with the administration of Toujeo DX E11.65   metFORMIN 500 MG tablet Commonly known as:  GLUCOPHAGE Take 1 tablet (500 mg total) by mouth daily with breakfast. Start taking on:  07/26/2017   metoprolol succinate 50 MG 24 hr tablet Commonly known as:  TOPROL-XL TAKE 1 TABLET BY MOUTH ONCE DAILY   omeprazole 20 MG capsule Commonly known as:  PRILOSEC TAKE 1 CAPSULE BY MOUTH DAILY   potassium chloride SA 20 MEQ tablet Commonly known as:  K-DUR,KLOR-CON TAKE 1 TABLET(20 MEQ) BY MOUTH DAILY      Allergies  Allergen Reactions  . Aspirin Nausea Only    And causes bruises  . Banana Hives  . Hydrocodone Hives  . Neomycin-Polymyxin-Gramicidin Itching and Swelling    Eye drops caused swelling in face and rash  and itching on arms and neck  . Penicillins Hives and Itching    Has patient had a PCN reaction causing immediate rash, facial/tongue/throat swelling, SOB or lightheadedness with hypotension: Yes Has patient had a PCN reaction causing severe rash involving mucus membranes or skin necrosis: No Has patient had a PCN reaction that required hospitalization: Yes Has patient had a PCN reaction occurring within the last 10 years: Yes If all of the above answers are "NO", then may proceed with Cephalosporin use.   . Tramadol Nausea And Vomiting  . Voltaren [Diclofenac Sodium] Nausea And Vomiting  . Latex Hives and Rash   Follow-up Information     Ileana Roup, MD. Schedule an appointment as soon as possible for a visit in 1 month(s).   Specialty:  General Surgery Contact information: [place] [address] [phone]        Vicenta Aly, [place]. Schedule an appointment as soon as possible for a visit in 1 week(s).   Specialty:  Nurse Practitioner Why:  Hospital follow up Contact information: [place] [address] [phone]            The results of significant diagnostics from this hospitalization (including imaging, microbiology, ancillary and laboratory) are listed below for reference.    Significant Diagnostic Studies: Ct Abdomen Pelvis W Contrast  Result Date: 07/22/2017 CLINICAL DATA:  Acute left lower quadrant abdominal pain. EXAM: CT ABDOMEN AND PELVIS WITH CONTRAST TECHNIQUE: Multidetector CT imaging of the abdomen and pelvis was performed using the standard protocol following bolus administration of intravenous contrast. CONTRAST:  44mL ISOVUE-300 IOPAMIDOL (ISOVUE-300) INJECTION 61% COMPARISON:  CT scan of February 15, 2016. FINDINGS: Lower chest: No acute abnormality. Hepatobiliary: Status post cholecystectomy. Stable left-sided pneumobilia is noted. Liver is otherwise unremarkable. No biliary dilatation is noted. Pancreas: Unremarkable. No pancreatic ductal dilatation or surrounding inflammatory changes. Spleen: Normal in size without focal abnormality. Adrenals/Urinary Tract: Stable left adrenal adenoma. Right adrenal gland appears normal. No hydronephrosis or renal obstruction is noted. No renal or ureteral calculi are noted. Urinary bladder is decompressed. Stomach/Bowel: The stomach appears normal. Status post appendectomy. Diverticulosis of descending and sigmoid colon is noted without inflammation. Large ventral hernia is noted in the pelvis which contains loops of large and small bowel. Transverse colon is nondilated and there for does not appear to be  incarcerated. However, there does appear to be dilatation of small-bowel loops within the hernia, as well as the loops proximal to the hernia secondary to obstruction. Vascular/Lymphatic: Aortic atherosclerosis. No enlarged abdominal or pelvic lymph nodes. Reproductive: Status post hysterectomy. No adnexal masses. Other: No abnormal fluid collection is noted. Musculoskeletal: No acute or significant osseous findings. IMPRESSION: Large ventral hernia is noted in the pelvis which contains loops of large and small bowel. It appears to be causing obstruction of the more proximal small bowel as well as dilatation of the small bowel within the hernia. No large bowel dilatation is noted. Aortic Atherosclerosis (ICD10-I70.0). Electronically Signed   By: Marijo Conception, M.D.   On: 07/22/2017 15:41   Dg Abd Portable 1v-small Bowel Obstruction Protocol-initial, 8 Hr Delay  Result Date: 07/23/2017 CLINICAL DATA:  Small bowel obstruction. EXAM: PORTABLE ABDOMEN - 1 VIEW COMPARISON:  07/22/2017, 1752 hours FINDINGS: NG tube in stomach. There is oral contrast within the ascending colon. No dilated loops of large or small bowel. No initial film provided. Comparison 24 hours prior. IMPRESSION: Contrast within the ascending colon. No evidence of  bowel obstruction. Electronically Signed   By: Suzy Bouchard M.D.   On: 07/23/2017 20:15   Dg Abd Portable 1 View  Result Date: 07/22/2017 CLINICAL DATA:  Evaluate NG tube placement EXAM: PORTABLE ABDOMEN - 1 VIEW COMPARISON:  CT abdomen pelvis 07/22/2017 FINDINGS: Enteric tube tip and side-port project over the stomach. Contrast material excreted in the bilateral renal collecting systems. Mildly gaseous distended loop of small bowel within the central abdomen. IMPRESSION: Enteric tube tip and side-port project over the stomach. Electronically Signed   By: Lovey Newcomer M.D.   On: 07/22/2017 18:21    Microbiology: No results found for this or any previous visit (from the past 240  hour(s)).   Labs: Basic Metabolic Panel: Recent Labs  Lab 07/22/17 1332 07/22/17 1911 07/23/17 0506 07/24/17 0454 07/25/17 0420  NA 140  --  143 139 140  K 4.6  --  3.6 3.3* 3.8  CL 102  --  108 108 109  CO2 24  --  24 22 23   GLUCOSE 377*  --  145* 179* 218*  BUN 25*  --  29* 16 9  CREATININE 1.29* 1.12* 0.91 0.73 0.62  CALCIUM 9.1  --  8.3* 8.0* 7.9*   Liver Function Tests: Recent Labs  Lab 07/22/17 1332 07/23/17 0506  AST 24 20  ALT 20 17  ALKPHOS 87 68  BILITOT 1.2 1.0  PROT 7.6 6.7  ALBUMIN 4.0 3.4*   Recent Labs  Lab 07/22/17 1332  LIPASE 23   No results for input(s): AMMONIA in the last 168 hours. CBC: Recent Labs  Lab 07/22/17 1332 07/22/17 1911 07/23/17 0506  WBC 11.5* 9.8 7.7  HGB 15.5* 15.8* 14.2  HCT 45.3 47.3* 42.9  MCV 90.6 90.1 90.9  PLT 287 279 252   Cardiac Enzymes: No results for input(s): CKTOTAL, CKMB, CKMBINDEX, TROPONINI in the last 168 hours. BNP: BNP (last 3 results) No results for input(s): BNP in the last 8760 hours.  ProBNP (last 3 results) No results for input(s): PROBNP in the last 8760 hours.  CBG: Recent Labs  Lab 07/24/17 0803 07/24/17 1147 07/24/17 1632 07/24/17 2230 07/25/17 0732  GLUCAP 140* 277* 177* 194* 183*       Signed:  Haydon Dorris  [facility] 07/25/2017, 10:16 AM

## 2017-07-25 NOTE — Progress Notes (Signed)
Pt given discharge instructions. No questions at this time.  Taken by wheelchair to main lobby.

## 2017-07-25 NOTE — Discharge Instructions (Signed)
Small Bowel Obstruction °A small bowel obstruction means that something is blocking the small bowel. The small bowel is also called the small intestine. It is the long tube that connects the stomach to the colon. An obstruction will stop food and fluids from passing through the small bowel. Treatment depends on what is causing the problem and how bad the problem is. °Follow these instructions at home: °· Get a lot of rest. °· Follow your diet as told by your doctor. You may need to: °? Only drink clear liquids until you start to get better. °? Avoid solid foods as told by your doctor. °· Take over-the-counter and prescription medicines only as told by your doctor. °· Keep all follow-up visits as told by your doctor. This is important. °Contact a doctor if: °· You have a fever. °· You have chills. °Get help right away if: °· You have pain or cramps that get worse. °· You throw up (vomit) blood. °· You have a feeling of being sick to your stomach (nausea) that does not go away. °· You cannot stop throwing up. °· You cannot drink fluids. °· You feel confused. °· You feel dry or thirsty (dehydrated). °· Your belly gets more bloated. °· You feel weak or you pass out (faint). °This information is not intended to replace advice given to you by your health care provider. Make sure you discuss any questions you have with your health care provider. °Document Released: 04/16/2004 Document Revised: 11/04/2015 Document Reviewed: 05/03/2014 °Elsevier Interactive Patient Education © 2018 Elsevier Inc. ° °

## 2017-07-25 NOTE — Plan of Care (Signed)
Pt alert and oriented, no complaints this am. Plan to d/c pt per MD order.

## 2017-07-27 ENCOUNTER — Emergency Department (HOSPITAL_COMMUNITY): Payer: Medicare HMO

## 2017-07-27 ENCOUNTER — Other Ambulatory Visit: Payer: Self-pay

## 2017-07-27 ENCOUNTER — Inpatient Hospital Stay (HOSPITAL_COMMUNITY): Payer: Medicare HMO

## 2017-07-27 ENCOUNTER — Inpatient Hospital Stay (HOSPITAL_COMMUNITY)
Admission: EM | Admit: 2017-07-27 | Discharge: 2017-08-05 | DRG: 336 | Disposition: A | Discharge status: Home Health | Payer: Medicare HMO | Attending: Internal Medicine | Admitting: Internal Medicine

## 2017-07-27 ENCOUNTER — Encounter (HOSPITAL_COMMUNITY): Payer: Self-pay | Admitting: Emergency Medicine

## 2017-07-27 DIAGNOSIS — I872 Venous insufficiency (chronic) (peripheral): Secondary | ICD-10-CM | POA: Diagnosis present

## 2017-07-27 DIAGNOSIS — T380X5A Adverse effect of glucocorticoids and synthetic analogues, initial encounter: Secondary | ICD-10-CM | POA: Diagnosis not present

## 2017-07-27 DIAGNOSIS — Z6841 Body Mass Index (BMI) 40.0 and over, adult: Secondary | ICD-10-CM | POA: Diagnosis not present

## 2017-07-27 DIAGNOSIS — M170 Bilateral primary osteoarthritis of knee: Secondary | ICD-10-CM | POA: Diagnosis present

## 2017-07-27 DIAGNOSIS — E876 Hypokalemia: Secondary | ICD-10-CM | POA: Diagnosis present

## 2017-07-27 DIAGNOSIS — IMO0002 Reserved for concepts with insufficient information to code with codable children: Secondary | ICD-10-CM

## 2017-07-27 DIAGNOSIS — K660 Peritoneal adhesions (postprocedural) (postinfection): Secondary | ICD-10-CM | POA: Diagnosis present

## 2017-07-27 DIAGNOSIS — I361 Nonrheumatic tricuspid (valve) insufficiency: Secondary | ICD-10-CM | POA: Diagnosis not present

## 2017-07-27 DIAGNOSIS — Z794 Long term (current) use of insulin: Secondary | ICD-10-CM | POA: Diagnosis present

## 2017-07-27 DIAGNOSIS — Z0189 Encounter for other specified special examinations: Secondary | ICD-10-CM

## 2017-07-27 DIAGNOSIS — T40605A Adverse effect of unspecified narcotics, initial encounter: Secondary | ICD-10-CM | POA: Diagnosis not present

## 2017-07-27 DIAGNOSIS — E1165 Type 2 diabetes mellitus with hyperglycemia: Secondary | ICD-10-CM

## 2017-07-27 DIAGNOSIS — F05 Delirium due to known physiological condition: Secondary | ICD-10-CM

## 2017-07-27 DIAGNOSIS — R109 Unspecified abdominal pain: Secondary | ICD-10-CM | POA: Diagnosis not present

## 2017-07-27 DIAGNOSIS — E1149 Type 2 diabetes mellitus with other diabetic neurological complication: Secondary | ICD-10-CM | POA: Diagnosis present

## 2017-07-27 DIAGNOSIS — K56600 Partial intestinal obstruction, unspecified as to cause: Secondary | ICD-10-CM | POA: Diagnosis not present

## 2017-07-27 DIAGNOSIS — D72829 Elevated white blood cell count, unspecified: Secondary | ICD-10-CM | POA: Diagnosis not present

## 2017-07-27 DIAGNOSIS — M11261 Other chondrocalcinosis, right knee: Secondary | ICD-10-CM

## 2017-07-27 DIAGNOSIS — T383X5A Adverse effect of insulin and oral hypoglycemic [antidiabetic] drugs, initial encounter: Secondary | ICD-10-CM | POA: Diagnosis not present

## 2017-07-27 DIAGNOSIS — K219 Gastro-esophageal reflux disease without esophagitis: Secondary | ICD-10-CM

## 2017-07-27 DIAGNOSIS — E160 Drug-induced hypoglycemia without coma: Secondary | ICD-10-CM | POA: Diagnosis not present

## 2017-07-27 DIAGNOSIS — E1140 Type 2 diabetes mellitus with diabetic neuropathy, unspecified: Secondary | ICD-10-CM | POA: Diagnosis present

## 2017-07-27 DIAGNOSIS — E785 Hyperlipidemia, unspecified: Secondary | ICD-10-CM | POA: Diagnosis present

## 2017-07-27 DIAGNOSIS — M8580 Other specified disorders of bone density and structure, unspecified site: Secondary | ICD-10-CM | POA: Diagnosis present

## 2017-07-27 DIAGNOSIS — K436 Other and unspecified ventral hernia with obstruction, without gangrene: Principal | ICD-10-CM | POA: Diagnosis present

## 2017-07-27 DIAGNOSIS — I10 Essential (primary) hypertension: Secondary | ICD-10-CM | POA: Diagnosis present

## 2017-07-27 DIAGNOSIS — G9349 Other encephalopathy: Secondary | ICD-10-CM | POA: Diagnosis not present

## 2017-07-27 DIAGNOSIS — E1169 Type 2 diabetes mellitus with other specified complication: Secondary | ICD-10-CM | POA: Diagnosis present

## 2017-07-27 DIAGNOSIS — E11649 Type 2 diabetes mellitus with hypoglycemia without coma: Secondary | ICD-10-CM | POA: Diagnosis present

## 2017-07-27 DIAGNOSIS — E118 Type 2 diabetes mellitus with unspecified complications: Secondary | ICD-10-CM | POA: Diagnosis present

## 2017-07-27 DIAGNOSIS — Z90710 Acquired absence of both cervix and uterus: Secondary | ICD-10-CM | POA: Diagnosis not present

## 2017-07-27 DIAGNOSIS — Z79899 Other long term (current) drug therapy: Secondary | ICD-10-CM

## 2017-07-27 DIAGNOSIS — R000 Tachycardia, unspecified: Secondary | ICD-10-CM | POA: Diagnosis not present

## 2017-07-27 DIAGNOSIS — L03116 Cellulitis of left lower limb: Secondary | ICD-10-CM | POA: Diagnosis present

## 2017-07-27 DIAGNOSIS — Z91018 Allergy to other foods: Secondary | ICD-10-CM

## 2017-07-27 DIAGNOSIS — Z886 Allergy status to analgesic agent status: Secondary | ICD-10-CM

## 2017-07-27 DIAGNOSIS — E0840 Diabetes mellitus due to underlying condition with diabetic neuropathy, unspecified: Secondary | ICD-10-CM | POA: Diagnosis not present

## 2017-07-27 DIAGNOSIS — Z91040 Latex allergy status: Secondary | ICD-10-CM

## 2017-07-27 DIAGNOSIS — Z885 Allergy status to narcotic agent status: Secondary | ICD-10-CM

## 2017-07-27 DIAGNOSIS — M25461 Effusion, right knee: Secondary | ICD-10-CM | POA: Diagnosis not present

## 2017-07-27 DIAGNOSIS — R630 Anorexia: Secondary | ICD-10-CM | POA: Diagnosis present

## 2017-07-27 DIAGNOSIS — Z8249 Family history of ischemic heart disease and other diseases of the circulatory system: Secondary | ICD-10-CM

## 2017-07-27 DIAGNOSIS — K56609 Unspecified intestinal obstruction, unspecified as to partial versus complete obstruction: Secondary | ICD-10-CM

## 2017-07-27 DIAGNOSIS — Z880 Allergy status to penicillin: Secondary | ICD-10-CM

## 2017-07-27 LAB — COMPREHENSIVE METABOLIC PANEL
ALK PHOS: 83 U/L (ref 38–126)
ALT: 34 U/L (ref 14–54)
ANION GAP: 12 (ref 5–15)
AST: 37 U/L (ref 15–41)
Albumin: 3.8 g/dL (ref 3.5–5.0)
BILIRUBIN TOTAL: 1.4 mg/dL — AB (ref 0.3–1.2)
BUN: 15 mg/dL (ref 6–20)
CALCIUM: 9 mg/dL (ref 8.9–10.3)
CO2: 22 mmol/L (ref 22–32)
Chloride: 104 mmol/L (ref 101–111)
Creatinine, Ser: 0.88 mg/dL (ref 0.44–1.00)
Glucose, Bld: 296 mg/dL — ABNORMAL HIGH (ref 65–99)
POTASSIUM: 4.3 mmol/L (ref 3.5–5.1)
Sodium: 138 mmol/L (ref 135–145)
TOTAL PROTEIN: 7.8 g/dL (ref 6.5–8.1)

## 2017-07-27 LAB — CBC
HEMATOCRIT: 44.3 % (ref 36.0–46.0)
HEMOGLOBIN: 15.1 g/dL — AB (ref 12.0–15.0)
MCH: 30.9 pg (ref 26.0–34.0)
MCHC: 34.1 g/dL (ref 30.0–36.0)
MCV: 90.8 fL (ref 78.0–100.0)
Platelets: 270 10*3/uL (ref 150–400)
RBC: 4.88 MIL/uL (ref 3.87–5.11)
RDW: 14 % (ref 11.5–15.5)
WBC: 8.2 10*3/uL (ref 4.0–10.5)

## 2017-07-27 LAB — URINALYSIS, ROUTINE W REFLEX MICROSCOPIC
GLUCOSE, UA: 50 mg/dL — AB
HGB URINE DIPSTICK: NEGATIVE
KETONES UR: 5 mg/dL — AB
NITRITE: NEGATIVE
PH: 5 (ref 5.0–8.0)
PROTEIN: 100 mg/dL — AB
Specific Gravity, Urine: 1.032 — ABNORMAL HIGH (ref 1.005–1.030)

## 2017-07-27 LAB — GLUCOSE, CAPILLARY
Glucose-Capillary: 265 mg/dL — ABNORMAL HIGH (ref 65–99)
Glucose-Capillary: 226 mg/dL — ABNORMAL HIGH (ref 65–99)

## 2017-07-27 LAB — HEMOGLOBIN A1C
HEMOGLOBIN A1C: 8.9 % — AB (ref 4.8–5.6)
MEAN PLASMA GLUCOSE: 208.73 mg/dL

## 2017-07-27 LAB — DIFFERENTIAL
BASOS PCT: 0 %
Basophils Absolute: 0 10*3/uL (ref 0.0–0.1)
EOS PCT: 1 %
Eosinophils Absolute: 0.1 10*3/uL (ref 0.0–0.7)
LYMPHS PCT: 17 %
Lymphs Abs: 1.4 10*3/uL (ref 0.7–4.0)
MONO ABS: 1.1 10*3/uL — AB (ref 0.1–1.0)
MONOS PCT: 14 %
Neutro Abs: 5.6 10*3/uL (ref 1.7–7.7)
Neutrophils Relative %: 68 %

## 2017-07-27 LAB — LIPASE, BLOOD: Lipase: 21 U/L (ref 11–51)

## 2017-07-27 LAB — MAGNESIUM: Magnesium: 1.3 mg/dL — ABNORMAL LOW (ref 1.7–2.4)

## 2017-07-27 MED ORDER — INSULIN NPH (HUMAN) (ISOPHANE) 100 UNIT/ML ~~LOC~~ SUSP
35.0000 [IU] | Freq: Two times a day (BID) | SUBCUTANEOUS | Status: DC
  Administered 2017-07-28 (×2): 35 [IU] via SUBCUTANEOUS
  Filled 2017-07-27: qty 10

## 2017-07-27 MED ORDER — METOPROLOL TARTRATE 5 MG/5ML IV SOLN
5.0000 mg | Freq: Four times a day (QID) | INTRAVENOUS | Status: DC
  Administered 2017-07-27 – 2017-08-04 (×29): 5 mg via INTRAVENOUS
  Filled 2017-07-27 (×30): qty 5

## 2017-07-27 MED ORDER — POTASSIUM CHLORIDE IN NACL 20-0.9 MEQ/L-% IV SOLN: INTRAVENOUS | Status: DC

## 2017-07-27 MED ORDER — SODIUM CHLORIDE 0.9 % IV SOLN
INTRAVENOUS | Status: DC
  Administered 2017-07-27 – 2017-07-28 (×3): via INTRAVENOUS
  Administered 2017-07-28: 100 mL/h via INTRAVENOUS

## 2017-07-27 MED ORDER — LEVALBUTEROL HCL 0.63 MG/3ML IN NEBU
0.6300 mg | INHALATION_SOLUTION | Freq: Four times a day (QID) | RESPIRATORY_TRACT | Status: DC | PRN
Start: 2017-07-27 — End: 2017-08-05
  Administered 2017-08-03: 0.63 mg via RESPIRATORY_TRACT
  Filled 2017-07-27: qty 3

## 2017-07-27 MED ORDER — ACETAMINOPHEN 325 MG PO TABS: 650.0000 mg | ORAL_TABLET | Freq: Four times a day (QID) | ORAL | Status: DC | PRN

## 2017-07-27 MED ORDER — ONDANSETRON HCL 4 MG PO TABS: 4.0000 mg | ORAL_TABLET | Freq: Four times a day (QID) | ORAL | Status: DC | PRN

## 2017-07-27 MED ORDER — ENOXAPARIN SODIUM 60 MG/0.6ML ~~LOC~~ SOLN
60.0000 mg | SUBCUTANEOUS | Status: DC
  Administered 2017-07-27 – 2017-07-28 (×2): 60 mg via SUBCUTANEOUS
  Filled 2017-07-27 (×2): qty 0.6

## 2017-07-27 MED ORDER — INSULIN ASPART 100 UNIT/ML ~~LOC~~ SOLN
0.0000 [IU] | Freq: Four times a day (QID) | SUBCUTANEOUS | Status: DC
  Administered 2017-07-28: 3 [IU] via SUBCUTANEOUS

## 2017-07-27 MED ORDER — ONDANSETRON HCL 4 MG/2ML IJ SOLN
4.0000 mg | Freq: Four times a day (QID) | INTRAMUSCULAR | Status: DC | PRN
  Administered 2017-07-30 – 2017-07-31 (×2): 4 mg via INTRAVENOUS
  Filled 2017-07-27 (×2): qty 2

## 2017-07-27 MED ORDER — DIATRIZOATE MEGLUMINE & SODIUM 66-10 % PO SOLN
90.0000 mL | Freq: Once | ORAL | Status: AC
  Administered 2017-07-27: 90 mL via NASOGASTRIC
  Filled 2017-07-27: qty 90

## 2017-07-27 MED ORDER — ONDANSETRON 4 MG PO TBDP
4.0000 mg | ORAL_TABLET | Freq: Once | ORAL | Status: AC | PRN
  Administered 2017-07-27: 4 mg via ORAL
  Filled 2017-07-27: qty 1

## 2017-07-27 MED ORDER — MORPHINE SULFATE (PF) 2 MG/ML IV SOLN
0.5000 mg | INTRAVENOUS | Status: DC | PRN
  Administered 2017-07-27 – 2017-07-30 (×3): 0.5 mg via INTRAVENOUS
  Filled 2017-07-27 (×3): qty 1

## 2017-07-27 MED ORDER — SODIUM CHLORIDE 0.9 % IV BOLUS
500.0000 mL | Freq: Once | INTRAVENOUS | Status: AC
  Administered 2017-07-27: 500 mL via INTRAVENOUS

## 2017-07-27 MED ORDER — FAMOTIDINE IN NACL 20-0.9 MG/50ML-% IV SOLN
20.0000 mg | Freq: Two times a day (BID) | INTRAVENOUS | Status: DC
  Administered 2017-07-27 – 2017-08-03 (×14): 20 mg via INTRAVENOUS
  Filled 2017-07-27 (×15): qty 50

## 2017-07-27 MED ORDER — ACETAMINOPHEN 650 MG RE SUPP
650.0000 mg | Freq: Four times a day (QID) | RECTAL | Status: DC | PRN
  Administered 2017-08-01: 650 mg via RECTAL
  Filled 2017-07-27: qty 1

## 2017-07-27 MED ORDER — INSULIN ASPART 100 UNIT/ML ~~LOC~~ SOLN: 0.0000 [IU] | Freq: Three times a day (TID) | SUBCUTANEOUS | Status: DC

## 2017-07-27 NOTE — ED Provider Notes (Signed)
[facility] DEPT Provider Note   CSN: [id number] Arrival date & time: 07/27/17  2440     History   Chief Complaint Chief Complaint  Patient presents with  . Emesis    HPI Debra [name] is a 72 y.o. female.  HPI Patient presents with nausea and vomiting.  Discharge from the hospital 2 days ago after bowel obstruction.  States she was doing better discharge but then almost immediately began vomiting again.  Still has passed gas but no bowel movements.  States her abdomen is more tender.  States she has not followed up with the surgeons yet.  States that she is vomited up a little streaks of blood also.  No lightheadedness or dizziness.  No fevers. Past Medical History:  Diagnosis Date  . Cellulitis of left leg    history of, most recent episode 01/09  . Chronic venous insufficiency   . Diabetes mellitus with neurological manifestation ([place])   . Diverticulitis    h/o 2-3 episodes in past  . Hyperlipidemia LDL goal < 100   . Hypertension goal BP (blood pressure) < 130/80   . Leg edema    secondary to chronic venous insufficiency  . Morbid obesity ([place])   . Osteopenia    DEXA scan 7/07    Patient Active Problem List   Diagnosis Date Noted  . SBO (small bowel obstruction) ([place]) 07/22/2017  . Abnormal LFTs 03/20/2016  . Abdominal pain, left lower quadrant 07/04/2015  . Type 2 diabetes mellitus with diabetic polyneuropathy ([place]) 10/06/2013  . Hyperlipidemia associated with type 2 diabetes mellitus ([place]) 10/06/2013  . Fungal dermatitis 08/11/2013  . Hyperlipidemia 06/09/2013  . Choledocholithiasis with acute cholecystitis 04/14/2013  . Symptomatic cholelithiasis 04/13/2013  . Neuropathic pain of both legs 04/04/2013  . Severe obesity (BMI >= 40) ([place]) 11/28/2012  . Obesity (BMI 30-39.9) 11/28/2012  . Diabetes mellitus with neurological manifestation ([place])   . Hypertension goal BP (blood pressure) < 130/80   . Osteopenia   . Chronic venous  insufficiency   . Leg edema   . Hypertensive retinopathy, grade 2 03/30/2012  . Medial epicondylitis 02/11/2012  . Superficial thrombophlebitis of left leg 07/15/2011  . GERD (gastroesophageal reflux disease) 02/25/2011  . Right knee pain 02/16/2011  . Preventive measure 09/11/2010  . Neck pain 07/03/2010  . OSTEOPENIA 04/12/2006  . Type 2 diabetes, uncontrolled, with neuropathy ([place]) 03/08/2006  . MORBID OBESITY 03/08/2006  . Essential hypertension 03/08/2006    Past Surgical History:  Procedure Laterality Date  . ABDOMINAL HYSTERECTOMY    . APPENDECTOMY    . CHOLECYSTECTOMY    . CHOLECYSTECTOMY N/A 04/14/2013   Procedure: LAPAROSCOPIC CHOLECYSTECTOMY WITH INTRAOPERATIVE CHOLANGIOGRAM;  Surgeon: Adin Hector, MD;  Location: [facility];  Service: General;  Laterality: N/A;  . COLECTOMY     with diverting colsotmy and revision in the 1990's for diverticulitis  . ERCP N/A 04/17/2013   Procedure: ENDOSCOPIC RETROGRADE CHOLANGIOPANCREATOGRAPHY (ERCP);  Surgeon: Irene Shipper, MD;  Location: [facility];  Service: Endoscopy;  Laterality: N/A;  note pt want general anesthesia for this [name].  preferto perform in endo unit, but if unavailable please arrange time in OR  . EYE SURGERY  8/13   left cataract removal & retinal repair  . LAPAROSCOPIC LYSIS OF ADHESIONS N/A 04/14/2013   Procedure: LAPAROSCOPIC LYSIS OF ADHESIONS;  Surgeon: Adin Hector, MD;  Location: [facility];  Service: General;  Laterality: N/A;     OB History   None  Home Medications    Prior to Admission medications   Medication Sig Start Date End Date Taking? Authorizing Provider  atorvastatin (LIPITOR) 40 MG tablet TAKE 1 TABLET BY MOUTH EVERY DAY Patient taking differently: TAKE 1 TABLET BY MOUTH at bedtime 01/05/17  Yes Lauree Chandler, NP  celecoxib (CELEBREX) 200 MG capsule Take 200 mg by mouth 2 (two) times daily. 04/20/17  Yes [provider]  diphenhydrAMINE (BENADRYL) 25 MG tablet Take 25 mg  by mouth at bedtime as needed for itching.   Yes [provider]  furosemide (LASIX) 20 MG tablet TAKE 1 TABLET(20 MG) BY MOUTH DAILY FOR SWELLING 08/31/16  Yes Reed, Tiffany L, DO  insulin NPH Human (HUMULIN N,NOVOLIN N) 100 UNIT/ML injection Inject 76-98 Units into the skin See admin instructions. Injects 98 units before breakfast and 76 units before supper.   Yes [provider]  metFORMIN (GLUCOPHAGE) 500 MG tablet Take 1 tablet (500 mg total) by mouth daily with breakfast. 07/26/17  Yes Mikhail, [place], DO  metoprolol succinate (TOPROL-XL) 50 MG 24 hr tablet TAKE 1 TABLET BY MOUTH ONCE DAILY 06/08/16  Yes Reed, Tiffany L, DO  omeprazole (PRILOSEC) 20 MG capsule TAKE 1 CAPSULE BY MOUTH DAILY 03/08/17  Yes Reed, Tiffany L, DO  potassium chloride SA (K-DUR,KLOR-CON) 20 MEQ tablet TAKE 1 TABLET(20 MEQ) BY MOUTH DAILY 06/03/16  Yes Reed, Tiffany L, DO  glucose blood (ACCU-CHEK AVIVA PLUS) test strip Ell.65 check blood sugar twice daily as directed 01/24/16   Reed, Tiffany L, DO  Insulin Pen Needle (BD PEN NEEDLE NANO U/F) 32G X 4 MM MISC Use once daily with the administration of Toujeo DX E11.65 02/20/16   Gayland Curry, DO    Family History Family History  Problem Relation Age of Onset  . Heart disease Mother   . Heart disease Father   . Heart disease Brother   . Cancer Brother   . Cancer Brother   . Hypertension Sister   . Heart disease Sister     Social History Social History   Tobacco Use  . Smoking status: Never Smoker  . Smokeless tobacco: Never Used  Substance Use Topics  . Alcohol use: No  . Drug use: No     Allergies   Aspirin; Banana; Hydrocodone; Neomycin-polymyxin-gramicidin; Penicillins; Tramadol; Voltaren [diclofenac sodium]; and Latex   Review of Systems Review of Systems  Constitutional: Positive for appetite change. Negative for fever.  HENT: Negative for congestion.   Respiratory: Negative for shortness of breath.   Cardiovascular: Negative  for chest pain.  Gastrointestinal: Positive for abdominal pain, nausea and vomiting.  Genitourinary: Negative for flank pain.  Musculoskeletal: Negative for back pain.  Skin: Positive for wound.       Chronic venous changes and ulcers of the lower extremities.  Neurological: Negative for weakness.  Hematological: Negative for adenopathy.  Psychiatric/Behavioral: Negative for confusion.     Physical Exam Updated Vital Signs BP 138/78 (BP Location: Right Arm)   Pulse (!) 103   Temp 98.2 F (36.8 C) (Oral)   Resp 20   LMP 07/02/1973   SpO2 93%   Physical Exam  Constitutional: She appears well-developed.  HENT:  Head: Atraumatic.  Eyes: Pupils are equal, round, and reactive to light.  Neck: Neck supple.  Cardiovascular:  Slight tachycardia  Pulmonary/Chest: Effort normal.  Abdominal: There is tenderness.  Upper abdominal tenderness.  No rebound or guarding.  Does have large ventral hernia that appears to be reducible.  Scars from previous  surgeries.  Musculoskeletal: She exhibits edema.  Neurological: She is alert.     ED Treatments / Results  Labs (all labs ordered are listed, but only abnormal results are displayed) Labs Reviewed  COMPREHENSIVE METABOLIC PANEL - Abnormal; Notable for the following components:      Result Value   Glucose, Bld 296 (*)    Total Bilirubin 1.4 (*)    All other components within normal limits  CBC - Abnormal; Notable for the following components:   Hemoglobin 15.1 (*)    All other components within normal limits  DIFFERENTIAL - Abnormal; Notable for the following components:   Monocytes Absolute 1.1 (*)    All other components within normal limits  LIPASE, BLOOD  URINALYSIS, ROUTINE W REFLEX MICROSCOPIC    EKG None  Radiology Dg Abdomen Acute W/chest  Result Date: 07/27/2017 CLINICAL DATA:  Discharge from the hospital 2 days ago for bowel obstruction. Persistent vomiting. EXAM: DG ABDOMEN ACUTE W/ 1V CHEST COMPARISON:  Abdominal  radiograph of Jul 23, 2017 FINDINGS: The lungs are well-expanded and clear. The heart and pulmonary vascularity are normal. There is calcification in the wall of the aortic arch. Within the abdomen there is scattered air-fluid levels in both small and large bowel. There is some gas and fluid in the stomach without significant distention. There is gas and stool in the rectum. There is biliary tract gas presumably from previous sphincterotomy. There surgical clips in the gallbladder fossa. IMPRESSION: Findings likely reflect a persistent partial mid to distal small bowel obstruction versus ileus. No evidence of perforation. Electronically Signed   By: David  Martinique M.D.   On: 07/27/2017 11:30    Procedures Procedures (including critical care time)  Medications Ordered in ED Medications  ondansetron (ZOFRAN-ODT) disintegrating tablet 4 mg (4 mg Oral Given 07/27/17 1002)  sodium chloride 0.9 % bolus 500 mL (0 mLs Intravenous Stopped 07/27/17 1044)     Initial Impression / Assessment and Plan / ED Course  I have reviewed the triage vital signs and the nursing notes.  Pertinent labs & imaging results that were available during my care of the patient were reviewed by me and considered in my medical decision making (see chart for details).     Patient returns for bowel obstruction.  Discharge 2 days ago for same.  Continued vomiting at home.  X-ray shows ileus versus bowel obstruction.  She has been passing gas at home.  Hernia is reducible.  Will admit to hospitalist.  Final Clinical Impressions(s) / ED Diagnoses   Final diagnoses:  Partial intestinal obstruction, unspecified cause [facility])    ED Discharge Orders    None       Davonna Belling, MD 07/27/17 1227

## 2017-07-27 NOTE — H&P (Signed)
[facility] History and Physical  Debra [name] BPZ:[id number] DOB: 1945/09/05 DOA: 07/27/2017  Referring physician:   PCP: Vicenta Aly, FNP   Chief Complaint:  Intractable nausea vomiting  HPI:   72 year old female with a past medical history of diabetes mellitus, hypertension, morbid obesity who was recently admitted 5/2-5/5 with complaints of abdominal pain nausea vomiting. CT abdomen pelvis showed a large ventral hernia in the pelvis containing mucous of large and small bowel obstruction of the more proximal small bowel. NG tube was placed.patient treated conservatively. General surgery was consulted and felt that surgery was not indicated for hernia repair as it was reducible and patient was not longer obstructed NG tube was removed and the patient was discharged home with surgery follow-up in 4 weeks. Patient presents today with nausea vomiting that started almost immediately after her discharge.patient complained of diffuse abdominal pain along with nausea and vomiting, however she has been passing flatus , no bowel movement for 36 hours ED course BP 138/78 (BP Location: Right Arm)   Pulse (!) 103   Temp 98.2 F (36.8 C) (Oral)   Resp 20   LMP 07/02/1973   SpO2 93%  X-ray shows ileus versus bowel obstruction.    Blood glucose 296, anion gap 12, bicarbonate 22 General surgery consultation was obtained for recurrent small bowel obstruction      Review of Systems: negative for the following   complete review of systems was done with pertinent positives in history of present illness    Past Medical History:  Diagnosis Date  . Cellulitis of left leg    history of, most recent episode 01/09  . Chronic venous insufficiency   . 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 Diverticulitis    h/o 2-3 episodes in past  . Hyperlipidemia LDL goal < 100   . Hypertension goal BP (blood pressure) < 130/80   . Leg edema    secondary to chronic venous  insufficiency  . Morbid obesity ([place])   . Osteopenia    DEXA scan 7/07     Past Surgical History:  Procedure Laterality Date  . ABDOMINAL HYSTERECTOMY    . APPENDECTOMY    . CHOLECYSTECTOMY    . CHOLECYSTECTOMY N/A 04/14/2013   Procedure: LAPAROSCOPIC CHOLECYSTECTOMY WITH INTRAOPERATIVE CHOLANGIOGRAM;  Surgeon: Adin Hector, MD;  Location: [facility];  Service: General;  Laterality: N/A;  . COLECTOMY     with diverting colsotmy and revision in the 1990's for diverticulitis  . ERCP N/A 04/17/2013   Procedure: ENDOSCOPIC RETROGRADE CHOLANGIOPANCREATOGRAPHY (ERCP);  Surgeon: Irene Shipper, MD;  Location: [facility];  Service: Endoscopy;  Laterality: N/A;  note pt want general anesthesia for this case.  preferto perform in endo unit, but if unavailable please arrange time in OR  . EYE SURGERY  8/13   left cataract removal & retinal repair  . LAPAROSCOPIC LYSIS OF ADHESIONS N/A 04/14/2013   Procedure: LAPAROSCOPIC LYSIS OF ADHESIONS;  Surgeon: Adin Hector, MD;  Location: [facility];  Service: General;  Laterality: N/A;      Social History:  reports that she has never smoked. She has never used smokeless tobacco. She reports that she does not drink alcohol or use drugs.    Allergies  Allergen Reactions  . Aspirin Nausea Only    And causes bruises  . Banana Hives  . Hydrocodone Hives  . Neomycin-Polymyxin-Gramicidin Itching and Swelling    Eye drops caused swelling in face and rash and itching on arms and  neck  . Penicillins Hives and Itching    Has patient had a PCN reaction causing immediate rash, facial/tongue/throat swelling, SOB or lightheadedness with hypotension: Yes Has patient had a PCN reaction causing severe rash involving mucus membranes or skin necrosis: No Has patient had a PCN reaction that required hospitalization: Yes Has patient had a PCN reaction occurring within the last 10 years: Yes If all of the above answers are "NO", then may proceed with Cephalosporin  use.   . Tramadol Nausea And Vomiting  . Voltaren [Diclofenac Sodium] Nausea And Vomiting  . Latex Hives and Rash    Family History  Problem Relation Age of Onset  . Heart disease Mother   . Heart disease Father   . Heart disease Brother   . Cancer Brother   . Cancer Brother   . Hypertension Sister   . Heart disease Sister        Prior to Admission medications   Medication Sig Start Date End Date Taking? Authorizing Provider  atorvastatin (LIPITOR) 40 MG tablet TAKE 1 TABLET BY MOUTH EVERY DAY Patient taking differently: TAKE 1 TABLET BY MOUTH at bedtime 01/05/17  Yes Lauree Chandler, NP  celecoxib (CELEBREX) 200 MG capsule Take 200 mg by mouth 2 (two) times daily. 04/20/17  Yes [provider]  diphenhydrAMINE (BENADRYL) 25 MG tablet Take 25 mg by mouth at bedtime as needed for itching.   Yes [provider]  furosemide (LASIX) 20 MG tablet TAKE 1 TABLET(20 MG) BY MOUTH DAILY FOR SWELLING 08/31/16  Yes Reed, Tiffany L, DO  insulin NPH Human (HUMULIN N,NOVOLIN N) 100 UNIT/ML injection Inject 76-98 Units into the skin See admin instructions. Injects 98 units before breakfast and 76 units before supper.   Yes [provider]  metFORMIN (GLUCOPHAGE) 500 MG tablet Take 1 tablet (500 mg total) by mouth daily with breakfast. 07/26/17  Yes Mikhail, Caspian, DO  metoprolol succinate (TOPROL-XL) 50 MG 24 hr tablet TAKE 1 TABLET BY MOUTH ONCE DAILY 06/08/16  Yes Reed, Tiffany L, DO  omeprazole (PRILOSEC) 20 MG capsule TAKE 1 CAPSULE BY MOUTH DAILY 03/08/17  Yes Reed, Tiffany L, DO  potassium chloride SA (K-DUR,KLOR-CON) 20 MEQ tablet TAKE 1 TABLET(20 MEQ) BY MOUTH DAILY 06/03/16  Yes Reed, Tiffany L, DO  glucose blood (ACCU-CHEK AVIVA PLUS) test strip Ell.65 check blood sugar twice daily as directed 01/24/16   Reed, Tiffany L, DO  Insulin Pen Needle (BD PEN NEEDLE NANO U/F) 32G X 4 MM MISC Use once daily with the administration of Toujeo DX E11.65 02/20/16   Gayland Curry, DO     Physical Exam: Vitals:   07/27/17 0958 07/27/17 1242  BP: 138/78 124/72  Pulse: (!) 103 (!) 106  Resp: 20 18  Temp: 98.2 F (36.8 C) 99.5 F (37.5 C)  TempSrc: Oral Oral  SpO2: 93% 98%  Weight:  120.7 kg (266 lb)  Height:  5\' 4"  (1.626 m)        Vitals:   07/27/17 0958 07/27/17 1242  BP: 138/78 124/72  Pulse: (!) 103 (!) 106  Resp: 20 18  Temp: 98.2 F (36.8 C) 99.5 F (37.5 C)  TempSrc: Oral Oral  SpO2: 93% 98%  Weight:  120.7 kg (266 lb)  Height:  5\' 4"  (1.626 m)   Constitutional: NAD, calm, comfortable Eyes: PERRL, lids and conjunctivae normal ENMT: Mucous membranes are moist. Posterior pharynx clear of any exudate or lesions.Normal dentition.  Neck: normal, supple, no masses, no thyromegaly Respiratory:  clear to auscultation bilaterally, no wheezing, no crackles. Normal respiratory effort. No accessory muscle use.  Cardiovascular: Regular rate and rhythm, no murmurs / rubs / gallops. No extremity edema. 2+ pedal pulses. No carotid bruits.  Abdominal: There is tenderness.  Upper abdominal tenderness.  No rebound or guarding.  Does have large ventral hernia that appears to be reducible.  Scars from previous surgeries. Musculoskeletal: no clubbing / cyanosis. No joint deformity upper and lower extremities. Good ROM, no contractures. Normal muscle tone.  Skin: no rashes, lesions, ulcers. No induration Neurologic: CN 2-12 grossly intact. Sensation intact, DTR normal. Strength 5/5 in all 4.  Psychiatric: Normal judgment and insight. Alert and oriented x 3. Normal mood.     Labs on Admission: I have personally reviewed following labs and imaging studies  CBC: Recent Labs  Lab 07/22/17 1332 07/22/17 1911 07/23/17 0506 07/27/17 1039  WBC 11.5* 9.8 7.7 8.2  NEUTROABS  --   --   --  5.6  HGB 15.5* 15.8* 14.2 15.1*  HCT 45.3 47.3* 42.9 44.3  MCV 90.6 90.1 90.9 90.8  PLT 287 279 252 694    Basic Metabolic Panel: Recent Labs  Lab  07/22/17 1332 07/22/17 1911 07/23/17 0506 07/24/17 0454 07/25/17 0420 07/27/17 1039  NA 140  --  143 139 140 138  K 4.6  --  3.6 3.3* 3.8 4.3  CL 102  --  108 108 109 104  CO2 24  --  24 22 23 22   GLUCOSE 377*  --  145* 179* 218* 296*  BUN 25*  --  29* 16 9 15   CREATININE 1.29* 1.12* 0.91 0.73 0.62 0.88  CALCIUM 9.1  --  8.3* 8.0* 7.9* 9.0    GFR: Estimated Creatinine Clearance: 76.2 mL/min (by C-G formula based on SCr of 0.88 mg/dL).  Liver Function Tests: Recent Labs  Lab 07/22/17 1332 07/23/17 0506 07/27/17 1039  AST 24 20 37  ALT 20 17 34  ALKPHOS 87 68 83  BILITOT 1.2 1.0 1.4*  PROT 7.6 6.7 7.8  ALBUMIN 4.0 3.4* 3.8   Recent Labs  Lab 07/22/17 1332 07/27/17 1039  LIPASE 23 21   No results for input(s): AMMONIA in the last 168 hours.  Coagulation Profile: No results for input(s): INR, PROTIME in the last 168 hours. No results for input(s): DDIMER in the last 72 hours.  Cardiac Enzymes: No results for input(s): CKTOTAL, CKMB, CKMBINDEX, TROPONINI in the last 168 hours.  BNP (last 3 results) No results for input(s): PROBNP in the last 8760 hours.  HbA1C: No results for input(s): HGBA1C in the last 72 hours. Lab Results  Component Value Date   HGBA1C 7.6 (H) 05/22/2016   HGBA1C 7.4 (H) 02/10/2016   HGBA1C 7.6 (H) 07/08/2015     CBG: Recent Labs  Lab 07/24/17 0803 07/24/17 1147 07/24/17 1632 07/24/17 2230 07/25/17 0732  GLUCAP 140* 277* 177* 194* 183*    Lipid Profile: No results for input(s): CHOL, HDL, LDLCALC, TRIG, CHOLHDL, LDLDIRECT in the last 72 hours.  Thyroid Function Tests: No results for input(s): TSH, T4TOTAL, FREET4, T3FREE, THYROIDAB in the last 72 hours.  Anemia Panel: No results for input(s): VITAMINB12, FOLATE, FERRITIN, TIBC, IRON, RETICCTPCT in the last 72 hours.  Urine analysis:    Component Value Date/Time   COLORURINE AMBER (A) 07/22/2017 1507   APPEARANCEUR HAZY (A) 07/22/2017 1507   LABSPEC 1.028 07/22/2017  1507   PHURINE 5.0 07/22/2017 1507   GLUCOSEU >=500 (A) 07/22/2017 1507   GLUCOSEU  NEG mg/dL 04/13/2007 0230   HGBUR NEGATIVE 07/22/2017 1507   BILIRUBINUR NEGATIVE 07/22/2017 1507   BILIRUBINUR Neg 01/01/2015 1348   KETONESUR 5 (A) 07/22/2017 1507   PROTEINUR 100 (A) 07/22/2017 1507   UROBILINOGEN 2.0 01/01/2015 1348   UROBILINOGEN 0.2 04/13/2013 0714   NITRITE NEGATIVE 07/22/2017 1507   LEUKOCYTESUR NEGATIVE 07/22/2017 1507    Sepsis Labs: @LABRCNTIP (procalcitonin:4,lacticidven:4) )No results found for this or any previous visit (from the past 240 hour(s)).       Radiological Exams on Admission: Dg Abdomen Acute W/chest  Result Date: 07/27/2017 CLINICAL DATA:  Discharge from the hospital 2 days ago for bowel obstruction. Persistent vomiting. EXAM: DG ABDOMEN ACUTE W/ 1V CHEST COMPARISON:  Abdominal radiograph of Jul 23, 2017 FINDINGS: The lungs are well-expanded and clear. The heart and pulmonary vascularity are normal. There is calcification in the wall of the aortic arch. Within the abdomen there is scattered air-fluid levels in both small and large bowel. There is some gas and fluid in the stomach without significant distention. There is gas and stool in the rectum. There is biliary tract gas presumably from previous sphincterotomy. There surgical clips in the gallbladder fossa. IMPRESSION: Findings likely reflect a persistent partial mid to distal small bowel obstruction versus ileus. No evidence of perforation. Electronically Signed   By: David  Martinique M.D.   On: 07/27/2017 11:30   Ct Abdomen Pelvis W Contrast  Result Date: 07/22/2017 CLINICAL DATA:  Acute left lower quadrant abdominal pain. EXAM: CT ABDOMEN AND PELVIS WITH CONTRAST TECHNIQUE: Multidetector CT imaging of the abdomen and pelvis was performed using the standard protocol following bolus administration of intravenous contrast. CONTRAST:  2mL ISOVUE-300 IOPAMIDOL (ISOVUE-300) INJECTION 61% COMPARISON:  CT scan of  February 15, 2016. FINDINGS: Lower chest: No acute abnormality. Hepatobiliary: Status post cholecystectomy. Stable left-sided pneumobilia is noted. Liver is otherwise unremarkable. No biliary dilatation is noted. Pancreas: Unremarkable. No pancreatic ductal dilatation or surrounding inflammatory changes. Spleen: Normal in size without focal abnormality. Adrenals/Urinary Tract: Stable left adrenal adenoma. Right adrenal gland appears normal. No hydronephrosis or renal obstruction is noted. No renal or ureteral calculi are noted. Urinary bladder is decompressed. Stomach/Bowel: The stomach appears normal. Status post appendectomy. Diverticulosis of descending and sigmoid colon is noted without inflammation. Large ventral hernia is noted in the pelvis which contains loops of large and small bowel. Transverse colon is nondilated and there for does not appear to be incarcerated. However, there does appear to be dilatation of small-bowel loops within the hernia, as well as the loops proximal to the hernia secondary to obstruction. Vascular/Lymphatic: Aortic atherosclerosis. No enlarged abdominal or pelvic lymph nodes. Reproductive: Status post hysterectomy. No adnexal masses. Other: No abnormal fluid collection is noted. Musculoskeletal: No acute or significant osseous findings. IMPRESSION: Large ventral hernia is noted in the pelvis which contains loops of large and small bowel. It appears to be causing obstruction of the more proximal small bowel as well as dilatation of the small bowel within the hernia. No large bowel dilatation is noted. Aortic Atherosclerosis (ICD10-I70.0). Electronically Signed   By: Marijo Conception, M.D.   On: 07/22/2017 15:41   Dg Abdomen Acute W/chest  Result Date: 07/27/2017 CLINICAL DATA:  Discharge from the hospital 2 days ago for bowel obstruction. Persistent vomiting. EXAM: DG ABDOMEN ACUTE W/ 1V CHEST COMPARISON:  Abdominal radiograph of Jul 23, 2017 FINDINGS: The lungs are well-expanded  and clear. The heart and pulmonary vascularity are normal. There is calcification in the wall of the  aortic arch. Within the abdomen there is scattered air-fluid levels in both small and large bowel. There is some gas and fluid in the stomach without significant distention. There is gas and stool in the rectum. There is biliary tract gas presumably from previous sphincterotomy. There surgical clips in the gallbladder fossa. IMPRESSION: Findings likely reflect a persistent partial mid to distal small bowel obstruction versus ileus. No evidence of perforation. Electronically Signed   By: David  Martinique M.D.   On: 07/27/2017 11:30   Dg Abd Portable 1v-small Bowel Obstruction Protocol-initial, 8 Hr Delay  Result Date: 07/23/2017 CLINICAL DATA:  Small bowel obstruction. EXAM: PORTABLE ABDOMEN - 1 VIEW COMPARISON:  07/22/2017, 1752 hours FINDINGS: NG tube in stomach. There is oral contrast within the ascending colon. No dilated loops of large or small bowel. No initial film provided. Comparison 24 hours prior. IMPRESSION: Contrast within the ascending colon. No evidence of bowel obstruction. Electronically Signed   By: Suzy Bouchard M.D.   On: 07/23/2017 20:15   Dg Abd Portable 1 View  Result Date: 07/22/2017 CLINICAL DATA:  Evaluate NG tube placement EXAM: PORTABLE ABDOMEN - 1 VIEW COMPARISON:  CT abdomen pelvis 07/22/2017 FINDINGS: Enteric tube tip and side-port project over the stomach. Contrast material excreted in the bilateral renal collecting systems. Mildly gaseous distended loop of small bowel within the central abdomen. IMPRESSION: Enteric tube tip and side-port project over the stomach. Electronically Signed   By: Lovey Newcomer M.D.   On: 07/22/2017 18:21      EKG: Independently reviewed.    Assessment/Plan Principal Problem:   Recurrent SBO (small bowel obstruction) (HCC) Patient being reevaluated by general surgery Now has an NG tube in place Conservative management with IV fluids,  antiemetics Surgery considering fixing hernia during this hospitalization      Type 2 diabetes, uncontrolled, with neuropathy (HCC) Decrease dose of Novolin, hold metformin, repeat hemoglobin A1c, sliding scale insulin    Essential hypertension Switch to IV metoprolol since patient is nothing by mouth    GERD (gastroesophageal reflux disease) Started on IV Pepcid, IV Protonix-on  shortage    Severe obesity (BMI >= 40) (HCC)  Hyperlipidemia hold statin as patient is NPO       DVT prophylaxis:  Lovenox     Code Status Orders full code  (From admission, onward)       consults called:  Family Communication: Admission, patients condition and plan of care including tests being ordered have been discussed with the patient  who indicates understanding and agree with the plan and Code Status  Admission status: inpatient    Disposition plan: Further plan will depend as patient's clinical course evolves and further radiologic and laboratory data become available. Likely home when stable   At the time of admission, it appears that the appropriate admission status for this patient is INPATIENT .Thisis judged to be reasonable and necessary in order to provide the required intensity of service to ensure the patient's safetygiven thepresenting symptoms, physical exam findings, and initial radiographic and laboratory data in the context of their chronic comorbidities.   Reyne Dumas MD [facility] Pager [phone]  If 7PM-7AM, please contact night-coverage [email or web address] Password [email or web address]  07/27/2017, 1:07 PM

## 2017-07-27 NOTE — ED Notes (Signed)
ED TO INPATIENT HANDOFF REPORT  Name/Age/Gender Debra [name] 72 y.o. female  Code Status    Code Status Orders  (From admission, onward)        Start     Ordered   07/27/17 1247  Full code  Continuous     07/27/17 1247    Code Status History    Date Active Date Inactive Code Status Order ID Comments User Context   07/22/2017 1738 07/25/2017 1355 Full Code [id number]  Cristal Ford, DO ED   04/14/2013 1451 04/19/2013 1346 Full Code [id number]  Fanny Skates, MD Inpatient   04/13/2013 1054 04/14/2013 1451 Full Code [id number]  Earnstine Regal, PA-C ED      Home/SNF/Other Home  Chief Complaint throwing up blood   Level of Care/Admitting Diagnosis ED Disposition    ED Disposition Condition [facility] Area: [facility] [[id number]]  Level of Care: Med-Surg [16]  Diagnosis: SBO (small bowel obstruction) [facility]) [[id number]]  Admitting Physician: Reyne Dumas [[id number]]  Attending Physician: Reyne Dumas [[id number]]  Estimated length of stay: past midnight tomorrow  Certification:: I certify this patient will need inpatient services for at least 2 midnights  PT Class (Do Not Modify): Inpatient [101]  PT Acc Code (Do Not Modify): Private [1]       Medical History Past Medical History:  Diagnosis Date  . Cellulitis of left leg    history of, most recent episode 01/09  . Chronic venous insufficiency   . 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 Diverticulitis    h/o 2-3 episodes in past  . Hyperlipidemia LDL goal < 100   . Hypertension goal BP (blood pressure) < 130/80   . Leg edema    secondary to chronic venous insufficiency  . Morbid obesity ([place])   . Osteopenia    DEXA scan 7/07    Allergies Allergies  Allergen Reactions  . Aspirin Nausea Only    And causes bruises  . Banana Hives  . Hydrocodone Hives  . Neomycin-Polymyxin-Gramicidin Itching and Swelling    Eye drops caused swelling in face and rash and itching on  arms and neck  . Penicillins Hives and Itching    Has patient had a PCN reaction causing immediate rash, facial/tongue/throat swelling, SOB or lightheadedness with hypotension: Yes Has patient had a PCN reaction causing severe rash involving mucus membranes or skin necrosis: No Has patient had a PCN reaction that required hospitalization: Yes Has patient had a PCN reaction occurring within the last 10 years: Yes If all of the above answers are "NO", then may proceed with Cephalosporin use.   . Tramadol Nausea And Vomiting  . Voltaren [Diclofenac Sodium] Nausea And Vomiting  . Latex Hives and Rash    IV Location/Drains/Wounds Patient Lines/Drains/Airways Status   Active Line/Drains/Airways    Name:   Placement date:   Placement time:   Site:   Days:   Peripheral IV 07/27/17 Right Wrist   07/27/17    1037    Wrist   less than 1   Incision 04/14/13 Abdomen Other (Comment)   04/14/13    1323     1565   Incision - 1 Port Abdomen 1: Left;Mid;Lateral   04/14/13    1222     1565   Incision - 6 Ports Abdomen 1: Right;Superior 2: Right;Lateral 3: Umbilicus Medial;Superior 5: Left;Superior 6: Left;Proximal;Mid   04/14/13    1219     1565  Labs/Imaging Results for orders placed or performed during the hospital encounter of 07/27/17 (from the past 48 hour(s))  Lipase, blood     Status: None   Collection Time: 07/27/17 10:39 AM  Result Value Ref Range   Lipase 21 11 - 51 U/L    Comment: Performed at [facility], [place] [address], [place], [place] [address]  Comprehensive metabolic panel     Status: Abnormal   Collection Time: 07/27/17 10:39 AM  Result Value Ref Range   Sodium 138 135 - 145 mmol/L   Potassium 4.3 3.5 - 5.1 mmol/L   Chloride 104 101 - 111 mmol/L   CO2 22 22 - 32 mmol/L   Glucose, Bld 296 (H) 65 - 99 mg/dL   BUN 15 6 - 20 mg/dL   Creatinine, Ser 0.88 0.44 - 1.00 mg/dL   Calcium 9.0 8.9 - 10.3 mg/dL   Total Protein 7.8 6.5 - 8.1 g/dL   Albumin 3.8  3.5 - 5.0 g/dL   AST 37 15 - 41 U/L   ALT 34 14 - 54 U/L   Alkaline Phosphatase 83 38 - 126 U/L   Total Bilirubin 1.4 (H) 0.3 - 1.2 mg/dL   GFR calc non Af Amer >60 >60 mL/min   GFR calc Af Amer >60 >60 mL/min    Comment: (NOTE) The eGFR has been calculated using the CKD EPI equation. This calculation has not been validated in all clinical situations. eGFR's persistently <60 mL/min signify possible Chronic Kidney Disease.    Anion gap 12 5 - 15    Comment: Performed at [facility], [place] [address] [place], [place] [address]  CBC     Status: Abnormal   Collection Time: 07/27/17 10:39 AM  Result Value Ref Range   WBC 8.2 4.0 - 10.5 K/uL   RBC 4.88 3.87 - 5.11 MIL/uL   Hemoglobin 15.1 (H) 12.0 - 15.0 g/dL   HCT 44.3 36.0 - 46.0 %   MCV 90.8 78.0 - 100.0 fL   MCH 30.9 26.0 - 34.0 pg   MCHC 34.1 30.0 - 36.0 g/dL   RDW 14.0 11.5 - 15.5 %   Platelets 270 150 - 400 K/uL    Comment: Performed at [facility], [place] [address] [place], [place] [address]  Differential     Status: Abnormal   Collection Time: 07/27/17 10:39 AM  Result Value Ref Range   Neutrophils Relative % 68 %   Neutro Abs 5.6 1.7 - 7.7 K/uL   Lymphocytes Relative 17 %   Lymphs Abs 1.4 0.7 - 4.0 K/uL   Monocytes Relative 14 %   Monocytes Absolute 1.1 (H) 0.1 - 1.0 K/uL   Eosinophils Relative 1 %   Eosinophils Absolute 0.1 0.0 - 0.7 K/uL   Basophils Relative 0 %   Basophils Absolute 0.0 0.0 - 0.1 K/uL    Comment: Performed at [facility], [place] [address] [place], [place] [address]   Dg Abdomen Acute W/chest  Result Date: 07/27/2017 CLINICAL DATA:  Discharge from the hospital 2 days ago for bowel obstruction. Persistent vomiting. EXAM: DG ABDOMEN ACUTE W/ 1V CHEST COMPARISON:  Abdominal radiograph of Jul 23, 2017 FINDINGS: The lungs are well-expanded and clear. The heart and pulmonary vascularity are normal. There is calcification in the wall of the aortic arch.  Within the abdomen there is scattered air-fluid levels in both small and large bowel. There is some gas and fluid in the stomach without significant distention.  There is gas and stool in the rectum. There is biliary tract gas presumably from previous sphincterotomy. There surgical clips in the gallbladder fossa. IMPRESSION: Findings likely reflect a persistent partial mid to distal small bowel obstruction versus ileus. No evidence of perforation. Electronically Signed   By: David  Martinique M.D.   On: 07/27/2017 11:30    Pending Labs Unresulted Labs (From admission, onward)   Start     Ordered   08/03/17 0500  Creatinine, serum  (enoxaparin (LOVENOX)    CrCl >/= 30 ml/min)  Weekly,   R    Comments:  while on enoxaparin therapy    07/27/17 1247   07/28/17 0500  Comprehensive metabolic panel  Tomorrow morning,   R     07/27/17 1247   07/28/17 0500  CBC  Tomorrow morning,   R     07/27/17 1247   07/27/17 1248  Magnesium  Add-on,   R     07/27/17 1247   07/27/17 1248  Urinalysis, Routine w reflex microscopic  Once,   R     07/27/17 1247   07/27/17 1246  CBC  (enoxaparin (LOVENOX)    CrCl >/= 30 ml/min)  Once,   R    Comments:  Baseline for enoxaparin therapy IF NOT ALREADY DRAWN.  Notify MD if PLT < 100 K.    07/27/17 1247   07/27/17 1246  Creatinine, serum  (enoxaparin (LOVENOX)    CrCl >/= 30 ml/min)  Once,   R    Comments:  Baseline for enoxaparin therapy IF NOT ALREADY DRAWN.    07/27/17 1247   07/27/17 1000  Urinalysis, Routine w reflex microscopic  STAT,   STAT     07/27/17 0959   Signed and Held  Hemoglobin A1c  Once,   R    Comments:  To assess prior glycemic control    Signed and Held      Vitals/Pain Today's Vitals   07/27/17 0958 07/27/17 1027 07/27/17 1242  BP: 138/78  124/72  Pulse: (!) 103  (!) 106  Resp: 20  18  Temp: 98.2 F (36.8 C)  99.5 F (37.5 C)  TempSrc: Oral  Oral  SpO2: 93%  98%  Weight:   266 lb (120.7 kg)  Height:   '5\' 4"'  (1.626 m)  PainSc: 8  8       Isolation Precautions No active isolations  Medications Medications  0.9 %  sodium chloride infusion ( Intravenous New Bag/Given 07/27/17 1331)  enoxaparin (LOVENOX) injection 40 mg (has no administration in time range)  acetaminophen (TYLENOL) tablet 650 mg (has no administration in time range)    Or  acetaminophen (TYLENOL) suppository 650 mg (has no administration in time range)  ondansetron (ZOFRAN) tablet 4 mg (has no administration in time range)    Or  ondansetron (ZOFRAN) injection 4 mg (has no administration in time range)  levalbuterol (XOPENEX) nebulizer solution 0.63 mg (has no administration in time range)  famotidine (PEPCID) IVPB 20 mg premix (has no administration in time range)  diatrizoate meglumine-sodium (GASTROGRAFIN) 66-10 % solution 90 mL (has no administration in time range)  ondansetron (ZOFRAN-ODT) disintegrating tablet 4 mg (4 mg Oral Given 07/27/17 1002)  sodium chloride 0.9 % bolus 500 mL (0 mLs Intravenous Stopped 07/27/17 1044)    Mobility walks with device

## 2017-07-27 NOTE — Progress Notes (Signed)
[facility]/Trauma Progress Note      Assessment/Plan Readmission for SBO likely 2/2 large ventral hernia - Pt was discharged 2 days ago and began having N&V yesterday morning following an episode of diarrhea. She states little flatus since vomiting started - very large ventral hernia that is not reducible - NGT, NPO, SBO protocol with gastrografin - 8hr and 24hr delayed abdominal films  FEN: NPO, IVF, NGT to intermittent wall suction VTE: SCD's, chemical DVT per medicine ID: none Foley:  none Follow up: TBD  DISPO: SBO protocol + NGT. Pt may need surgery this admission. We will follow.     LOS: 0 days    Subjective: CC: abdominal pain, N & V  Started Monday morning. One episode of diarrhea. Scant flatus since onset of vomiting. Pt has not been able to keep anything down. She states her emesis is brown and foul smelling. No associated symptoms. No fevers or chills.   Objective: Vital signs in last 24 hours: Temp:  [98.2 F (36.8 C)-99.5 F (37.5 C)] 99.5 F (37.5 C) (05/07 1242) Pulse Rate:  [103-106] 106 (05/07 1242) Resp:  [18-20] 18 (05/07 1242) BP: (124-138)/(72-78) 124/72 (05/07 1242) SpO2:  [93 %-98 %] 98 % (05/07 1242) Weight:  [120.7 kg (266 lb)] 120.7 kg (266 lb) (05/07 1242)    Intake/Output from previous day: No intake/output data recorded. Intake/Output this shift: No intake/output data recorded.  PE: Gen:  Alert, NAD, pleasant, cooperative Card:  RRR, no M/G/R heard Pulm:  Rate and effort normal Abd: obese, soft, large ventral hernia that is not reducible, TTP of site of hernia with mild guarding, no signs of peritonitis. Hypoactive BS.  Skin: warm and dry   Anti-infectives: Anti-infectives (From admission, onward)   None      Lab Results:  Recent Labs    07/27/17 1039  WBC 8.2  HGB 15.1*  HCT 44.3  PLT 270   BMET Recent Labs    07/25/17 0420 07/27/17 1039  NA 140 138  K 3.8 4.3  CL 109 104  CO2 23 22  GLUCOSE  218* 296*  BUN 9 15  CREATININE 0.62 0.88  CALCIUM 7.9* 9.0   PT/INR No results for input(s): LABPROT, INR in the last 72 hours. CMP     Component Value Date/Time   NA 138 07/27/2017 1039   NA 143 07/08/2015 1023   K 4.3 07/27/2017 1039   CL 104 07/27/2017 1039   CO2 22 07/27/2017 1039   GLUCOSE 296 (H) 07/27/2017 1039   BUN 15 07/27/2017 1039   BUN 21 07/08/2015 1023   CREATININE 0.88 07/27/2017 1039   CREATININE 0.86 05/22/2016 0903   CALCIUM 9.0 07/27/2017 1039   PROT 7.8 07/27/2017 1039   PROT 6.5 10/03/2013 0920   ALBUMIN 3.8 07/27/2017 1039   ALBUMIN 4.3 10/03/2013 0920   AST 37 07/27/2017 1039   ALT 34 07/27/2017 1039   ALKPHOS 83 07/27/2017 1039   BILITOT 1.4 (H) 07/27/2017 1039   GFRNONAA >60 07/27/2017 1039   GFRNONAA 69 05/22/2016 0903   GFRAA >60 07/27/2017 1039   GFRAA 80 05/22/2016 0903   Lipase     Component Value Date/Time   LIPASE 21 07/27/2017 1039    Studies/Results: Dg Abdomen Acute W/chest  Result Date: 07/27/2017 CLINICAL DATA:  Discharge from the hospital 2 days ago for bowel obstruction. Persistent vomiting. EXAM: DG ABDOMEN ACUTE W/ 1V CHEST COMPARISON:  Abdominal radiograph of Jul 23, 2017 FINDINGS: The lungs are well-expanded and clear.  The heart and pulmonary vascularity are normal. There is calcification in the wall of the aortic arch. Within the abdomen there is scattered air-fluid levels in both small and large bowel. There is some gas and fluid in the stomach without significant distention. There is gas and stool in the rectum. There is biliary tract gas presumably from previous sphincterotomy. There surgical clips in the gallbladder fossa. IMPRESSION: Findings likely reflect a persistent partial mid to distal small bowel obstruction versus ileus. No evidence of perforation. Electronically Signed   By: David  Martinique M.D.   On: 07/27/2017 11:30      Kalman Drape , [facility] 07/27/2017, 1:11 PM  Pager:  [phone] Mon-Wed, Friday 7:00am-4:30pm Thurs 7am-11:30am  Consults: [phone]

## 2017-07-27 NOTE — ED Triage Notes (Signed)
Pt reports that she was just discharged on Sunday from hospital for bowel obstruction.  Pt reports that she is still vomiting. And cant take it anymore.

## 2017-07-28 ENCOUNTER — Inpatient Hospital Stay (HOSPITAL_COMMUNITY): Payer: Medicare HMO

## 2017-07-28 DIAGNOSIS — E1169 Type 2 diabetes mellitus with other specified complication: Secondary | ICD-10-CM

## 2017-07-28 DIAGNOSIS — E785 Hyperlipidemia, unspecified: Secondary | ICD-10-CM

## 2017-07-28 DIAGNOSIS — I10 Essential (primary) hypertension: Secondary | ICD-10-CM

## 2017-07-28 DIAGNOSIS — K56600 Partial intestinal obstruction, unspecified as to cause: Secondary | ICD-10-CM

## 2017-07-28 LAB — CBC
HEMATOCRIT: 40.5 % (ref 36.0–46.0)
HEMOGLOBIN: 13.1 g/dL (ref 12.0–15.0)
MCH: 29.9 pg (ref 26.0–34.0)
MCHC: 32.3 g/dL (ref 30.0–36.0)
MCV: 92.5 fL (ref 78.0–100.0)
Platelets: 232 10*3/uL (ref 150–400)
RBC: 4.38 MIL/uL (ref 3.87–5.11)
RDW: 14.2 % (ref 11.5–15.5)
WBC: 7.7 10*3/uL (ref 4.0–10.5)

## 2017-07-28 LAB — COMPREHENSIVE METABOLIC PANEL
ALT: 28 U/L (ref 14–54)
AST: 22 U/L (ref 15–41)
Albumin: 3.1 g/dL — ABNORMAL LOW (ref 3.5–5.0)
Alkaline Phosphatase: 66 U/L (ref 38–126)
Anion gap: 10 (ref 5–15)
BUN: 16 mg/dL (ref 6–20)
CHLORIDE: 105 mmol/L (ref 101–111)
CO2: 26 mmol/L (ref 22–32)
CREATININE: 0.95 mg/dL (ref 0.44–1.00)
Calcium: 8.1 mg/dL — ABNORMAL LOW (ref 8.9–10.3)
GFR calc non Af Amer: 59 mL/min — ABNORMAL LOW (ref >60)
Glucose, Bld: 158 mg/dL — ABNORMAL HIGH (ref 65–99)
POTASSIUM: 3.4 mmol/L — AB (ref 3.5–5.1)
SODIUM: 141 mmol/L (ref 135–145)
Total Bilirubin: 0.9 mg/dL (ref 0.3–1.2)
Total Protein: 6.6 g/dL (ref 6.5–8.1)

## 2017-07-28 LAB — GLUCOSE, CAPILLARY
GLUCOSE-CAPILLARY: 120 mg/dL — AB (ref 65–99)
GLUCOSE-CAPILLARY: 85 mg/dL (ref 65–99)
GLUCOSE-CAPILLARY: 86 mg/dL (ref 65–99)
Glucose-Capillary: 148 mg/dL — ABNORMAL HIGH (ref 65–99)

## 2017-07-28 MED ORDER — POTASSIUM CHLORIDE 10 MEQ/100ML IV SOLN
10.0000 meq | INTRAVENOUS | Status: AC
  Administered 2017-07-28 (×4): 10 meq via INTRAVENOUS
  Filled 2017-07-28 (×4): qty 100

## 2017-07-28 MED ORDER — MAGNESIUM SULFATE 2 GM/50ML IV SOLN
2.0000 g | Freq: Once | INTRAVENOUS | Status: AC
  Administered 2017-07-28: 2 g via INTRAVENOUS
  Filled 2017-07-28: qty 50

## 2017-07-28 NOTE — Progress Notes (Signed)
[facility] PROGRESS NOTE    Progress Note  Debra [name]  UKG:[id number] DOB: 1945-04-21 DOA: 07/27/2017 PCP: Vicenta Aly, FNP     Brief Narrative:   Debra [name] is an 72 y.o. female past medical history of diabetes, essential hypertension, morbid obesity recently admitted and discharged on 07/25/2017 for small bowel obstruction with ventral large hernia treated conservatively comes in again with nausea vomiting and no bowel movements for over 24 hours, found to have a small bowel obstruction.  Assessment/Plan:   Recurrent SBO (small bowel obstruction) in the setting of a ventral hernia: NG tube placed with copious amount through the NG. She relates she is passing gas but contrast is on the right colon. Surgery was consulted, recommended conservative management. We will continue IV fluids replete electrolytes and antiemetics. Keep the patient n.p.o. further management per surgery  Type 2 diabetes, uncontrolled, with neuropathy ([place]) Continue long-acting insulin per sliding scale.  Essential hypertension Continue metoprolol IV.  Blood pressure seems to be stable.  GERD (gastroesophageal reflux disease) Continue Pepcid IV.  Severe obesity (BMI >= 40) (H[facility]) Counseling.  Hyperlipidemia associated with type 2 diabetes mellitus ([place]) Hold statins, until she is able to tolerate diet   DVT prophylaxis: lovenox  Family Communication:none Disposition Plan/Barrier to D/C: home in 2-3 days Code Status:     Code Status Orders  (From admission, onward)        Start     Ordered   07/27/17 1247  Full code  Continuous     07/27/17 1247    Code Status History    Date Active Date Inactive Code Status Order ID Comments User Context   07/22/2017 1738 07/25/2017 1355 Full Code [id number]  Cristal Ford, DO ED   04/14/2013 1451 04/19/2013 1346 Full Code [id number]  Fanny Skates, MD Inpatient   04/13/2013 1054 04/14/2013 1451 Full Code [id number]  Earnstine Regal, PA-C ED          IV Access:    Peripheral IV   Procedures and diagnostic studies:   Dg Abdomen Acute W/chest  Result Date: 07/27/2017 CLINICAL DATA:  Discharge from the hospital 2 days ago for bowel obstruction. Persistent vomiting. EXAM: DG ABDOMEN ACUTE W/ 1V CHEST COMPARISON:  Abdominal radiograph of Jul 23, 2017 FINDINGS: The lungs are well-expanded and clear. The heart and pulmonary vascularity are normal. There is calcification in the wall of the aortic arch. Within the abdomen there is scattered air-fluid levels in both small and large bowel. There is some gas and fluid in the stomach without significant distention. There is gas and stool in the rectum. There is biliary tract gas presumably from previous sphincterotomy. There surgical clips in the gallbladder fossa. IMPRESSION: Findings likely reflect a persistent partial mid to distal small bowel obstruction versus ileus. No evidence of perforation. Electronically Signed   By: David  Martinique M.D.   On: 07/27/2017 11:30   Dg Abd Portable 1v-small Bowel Obstruction Protocol-initial, 8 Hr Delay  Result Date: 07/28/2017 CLINICAL DATA:  Initial evaluation for enteric tube placement. EXAM: PORTABLE ABDOMEN - 1 VIEW COMPARISON:  Prior radiograph from 07/27/2017. FINDINGS: Enteric tube in place with tip in side hole overlying the stomach, well beyond the GE junction. Tip projects inferiorly. Bowel gas pattern within normal limits without obstruction. Enteric contrast material within the right colon. Cholecystectomy clips noted. Suture material overlies the right abdomen. No soft tissue mass or abnormal calcification. Visualized osseous structures within normal limits. IMPRESSION: 1. Enteric tube in place with tip in side  hole overlying the stomach, well beyond the GE junction. 2. Nonobstructive bowel gas pattern with enteric contrast material within the right colon. Electronically Signed   By: Jeannine Boga M.D.   On: 07/28/2017 01:08   Dg Abd  Portable 1v-small Bowel Protocol-position Verification  Result Date: 07/27/2017 CLINICAL DATA:  Nasogastric tube placement EXAM: PORTABLE ABDOMEN - 1 VIEW COMPARISON:  Chest radiograph 07/27/2017 FINDINGS: Nasogastric tube tip and side port below the diaphragm. The side port is approximately 2.5 cm distal to the expected location of the gastroesophageal junction. This could be advanced by 4-5 cm. Visualized lungs are clear. No dilated small bowel. IMPRESSION: NG tube side port approximately 2 cm distal to the GE junction. This could be advanced by 4-5 cm for optimal positioning. Electronically Signed   By: Ulyses Jarred M.D.   On: 07/27/2017 15:21     Medical Consultants:    None.  Anti-Infectives:   None  Subjective:    [name] no complaints she denies any abdominal pain.  She is anorexic is passing gas but has not had a bowel movement.  Objective:    Vitals:   07/27/17 0958 07/27/17 1242 07/27/17 2317 07/28/17 0621  BP: 138/78 124/72 (!) 127/54 (!) 124/52  Pulse: (!) 103 (!) 106 100 80  Resp: 20 18 20 20   Temp: 98.2 F (36.8 C) 99.5 F (37.5 C) 98.5 F (36.9 C) 98 F (36.7 C)  TempSrc: Oral Oral Oral Oral  SpO2: 93% 98% 92% 94%  Weight:  120.7 kg (266 lb)    Height:  5\' 4"  (1.626 m)      Intake/Output Summary (Last 24 hours) at 07/28/2017 0912 Last data filed at 07/28/2017 0754 Gross per 24 hour  Intake 1848.33 ml  Output 1975 ml  Net -126.67 ml   Filed Weights   07/27/17 1242  Weight: 120.7 kg (266 lb)    Exam: General exam: In no acute distress. Respiratory system: Good air movement and clear to auscultation. Cardiovascular system: S1 & S2 heard, RRR.   Gastrointestinal system: Positive bowel sounds soft nontender nondistended. Central nervous system: Alert and oriented. No focal neurological deficits. Extremities: No pedal edema. Skin: No rashes, lesions or ulcers Psychiatry: Judgement and insight appear normal. Mood & affect appropriate.    Data  Reviewed:    Labs: Basic Metabolic Panel: Recent Labs  Lab 07/23/17 0506 07/24/17 0454 07/25/17 0420 07/27/17 1039 07/28/17 0612  NA 143 139 140 138 141  K 3.6 3.3* 3.8 4.3 3.4*  CL 108 108 109 104 105  CO2 24 22 23 22 26   GLUCOSE 145* 179* 218* 296* 158*  BUN 29* 16 9 15 16   CREATININE 0.91 0.73 0.62 0.88 0.95  CALCIUM 8.3* 8.0* 7.9* 9.0 8.1*  MG  --   --   --  1.3*  --    GFR Estimated Creatinine Clearance: 70.5 mL/min (by C-G formula based on SCr of 0.95 mg/dL). Liver Function Tests: Recent Labs  Lab 07/22/17 1332 07/23/17 0506 07/27/17 1039 07/28/17 0612  AST 24 20 37 22  ALT 20 17 34 28  ALKPHOS 87 68 83 66  BILITOT 1.2 1.0 1.4* 0.9  PROT 7.6 6.7 7.8 6.6  ALBUMIN 4.0 3.4* 3.8 3.1*   Recent Labs  Lab 07/22/17 1332 07/27/17 1039  LIPASE 23 21   No results for input(s): AMMONIA in the last 168 hours. Coagulation profile No results for input(s): INR, PROTIME in the last 168 hours.  CBC: Recent Labs  Lab 07/22/17  1332 07/22/17 1911 07/23/17 0506 07/27/17 1039 07/28/17 0612  WBC 11.5* 9.8 7.7 8.2 7.7  NEUTROABS  --   --   --  5.6  --   HGB 15.5* 15.8* 14.2 15.1* 13.1  HCT 45.3 47.3* 42.9 44.3 40.5  MCV 90.6 90.1 90.9 90.8 92.5  PLT 287 279 252 270 232   Cardiac Enzymes: No results for input(s): CKTOTAL, CKMB, CKMBINDEX, TROPONINI in the last 168 hours. BNP (last 3 results) No results for input(s): PROBNP in the last 8760 hours. CBG: Recent Labs  Lab 07/24/17 2230 07/25/17 0732 07/27/17 1511 07/27/17 2314 07/28/17 0619  GLUCAP 194* 183* 265* 226* 148*   D-Dimer: No results for input(s): DDIMER in the last 72 hours. Hgb A1c: Recent Labs    07/27/17 1039  HGBA1C 8.9*   Lipid Profile: No results for input(s): CHOL, HDL, LDLCALC, TRIG, CHOLHDL, LDLDIRECT in the last 72 hours. Thyroid function studies: No results for input(s): TSH, T4TOTAL, T3FREE, THYROIDAB in the last 72 hours.  Invalid input(s): FREET3 Anemia work up: No results  for input(s): VITAMINB12, FOLATE, FERRITIN, TIBC, IRON, RETICCTPCT in the last 72 hours. Sepsis Labs: Recent Labs  Lab 07/22/17 1911 07/23/17 0506 07/27/17 1039 07/28/17 0612  WBC 9.8 7.7 8.2 7.7   Microbiology No results found for this or any previous visit (from the past 240 hour(s)).   Medications:   . enoxaparin (LOVENOX) injection  60 mg Subcutaneous Q24H  . insulin aspart  0-9 Units Subcutaneous Q6H  . insulin NPH Human  35 Units Subcutaneous BID AC & HS  . metoprolol tartrate  5 mg Intravenous Q6H   Continuous Infusions: . sodium chloride 100 mL/hr at 07/28/17 0009  . famotidine (PEPCID) IV Stopped (07/28/17 0035)      LOS: 1 day   Charlynne Cousins  [facility] Pager [phone]  *Please refer to [email or web address], password [email or web address] to get updated schedule on who will round on this patient, as hospitalists switch teams weekly. If 7PM-7AM, please contact night-coverage at [email or web address], password [email or web address] for any overnight needs.  07/28/2017, 9:12 AM

## 2017-07-28 NOTE — Progress Notes (Signed)
[facility] Progress Note     Subjective: CC: sbo 2/2 large ventral hernia Patient with no abdominal pain this AM, no nausea. Having a lot of output from NGT. Has not passed flatus or BM but feels like she has some rumbling in her abdomen. Wants hernia repaired.   Objective: Vital signs in last 24 hours: Temp:  [98 F (36.7 C)-99.5 F (37.5 C)] 98 F (36.7 C) (05/08 0621) Pulse Rate:  [80-106] 80 (05/08 0621) Resp:  [18-20] 20 (05/08 0621) BP: (124-138)/(52-78) 124/52 (05/08 0621) SpO2:  [92 %-98 %] 94 % (05/08 0621) Weight:  [120.7 kg (266 lb)] 120.7 kg (266 lb) (05/07 1242) Last BM Date: 07/24/17  Intake/Output from previous day: 05/07 0701 - 05/08 0700 In: 1848.3 [I.V.:1748.3; IV Piggyback:100] Out: 1875 [Urine:350; Emesis/NG output:1525] Intake/Output this shift: No intake/output data recorded.  PE: Gen:  Alert, NAD, pleasant Card:  Regular rate and rhythm, pedal pulses 2+ BL Pulm:  Normal effort, clear to auscultation bilaterally Abd: Soft, non-tender, non-distended, bowel sounds present in all 4 quadrants, no HSM, incisions C/D/I Skin: warm and dry, no rashes  Psych: A&Ox3   Lab Results:  Recent Labs    07/27/17 1039 07/28/17 0612  WBC 8.2 7.7  HGB 15.1* 13.1  HCT 44.3 40.5  PLT 270 232   BMET Recent Labs    07/27/17 1039 07/28/17 0612  NA 138 141  K 4.3 3.4*  CL 104 105  CO2 22 26  GLUCOSE 296* 158*  BUN 15 16  CREATININE 0.88 0.95  CALCIUM 9.0 8.1*   PT/INR No results for input(s): LABPROT, INR in the last 72 hours. CMP     Component Value Date/Time   NA 141 07/28/2017 0612   NA 143 07/08/2015 1023   K 3.4 (L) 07/28/2017 0612   CL 105 07/28/2017 0612   CO2 26 07/28/2017 0612   GLUCOSE 158 (H) 07/28/2017 0612   BUN 16 07/28/2017 0612   BUN 21 07/08/2015 1023   CREATININE 0.95 07/28/2017 0612   CREATININE 0.86 05/22/2016 0903   CALCIUM 8.1 (L) 07/28/2017 0612   PROT 6.6 07/28/2017 0612   PROT 6.5 10/03/2013 0920   ALBUMIN 3.1 (L) 07/28/2017 0612   ALBUMIN 4.3 10/03/2013 0920   AST 22 07/28/2017 0612   ALT 28 07/28/2017 0612   ALKPHOS 66 07/28/2017 0612   BILITOT 0.9 07/28/2017 0612   GFRNONAA 59 (L) 07/28/2017 0612   GFRNONAA 69 05/22/2016 0903   GFRAA >60 07/28/2017 0612   GFRAA 80 05/22/2016 0903   Lipase     Component Value Date/Time   LIPASE 21 07/27/2017 1039       Studies/Results: Dg Abdomen Acute W/chest  Result Date: 07/27/2017 CLINICAL DATA:  Discharge from the hospital 2 days ago for bowel obstruction. Persistent vomiting. EXAM: DG ABDOMEN ACUTE W/ 1V CHEST COMPARISON:  Abdominal radiograph of Jul 23, 2017 FINDINGS: The lungs are well-expanded and clear. The heart and pulmonary vascularity are normal. There is calcification in the wall of the aortic arch. Within the abdomen there is scattered air-fluid levels in both small and large bowel. There is some gas and fluid in the stomach without significant distention. There is gas and stool in the rectum. There is biliary tract gas presumably from previous sphincterotomy. There surgical clips in the gallbladder fossa. IMPRESSION: Findings likely reflect a persistent partial mid to distal small bowel obstruction versus ileus. No evidence of perforation. Electronically Signed   By: David  Martinique M.D.   On: 07/27/2017  11:30   Dg Abd Portable 1v-small Bowel Obstruction Protocol-initial, 8 Hr Delay  Result Date: 07/28/2017 CLINICAL DATA:  Initial evaluation for enteric tube placement. EXAM: PORTABLE ABDOMEN - 1 VIEW COMPARISON:  Prior radiograph from 07/27/2017. FINDINGS: Enteric tube in place with tip in side hole overlying the stomach, well beyond the GE junction. Tip projects inferiorly. Bowel gas pattern within normal limits without obstruction. Enteric contrast material within the right colon. Cholecystectomy clips noted. Suture material overlies the right abdomen. No soft tissue mass or abnormal calcification. Visualized osseous structures within  normal limits. IMPRESSION: 1. Enteric tube in place with tip in side hole overlying the stomach, well beyond the GE junction. 2. Nonobstructive bowel gas pattern with enteric contrast material within the right colon. Electronically Signed   By: Jeannine Boga M.D.   On: 07/28/2017 01:08   Dg Abd Portable 1v-small Bowel Protocol-position Verification  Result Date: 07/27/2017 CLINICAL DATA:  Nasogastric tube placement EXAM: PORTABLE ABDOMEN - 1 VIEW COMPARISON:  Chest radiograph 07/27/2017 FINDINGS: Nasogastric tube tip and side port below the diaphragm. The side port is approximately 2.5 cm distal to the expected location of the gastroesophageal junction. This could be advanced by 4-5 cm. Visualized lungs are clear. No dilated small bowel. IMPRESSION: NG tube side port approximately 2 cm distal to the GE junction. This could be advanced by 4-5 cm for optimal positioning. Electronically Signed   By: Ulyses Jarred M.D.   On: 07/27/2017 15:21    Anti-infectives: Anti-infectives (From admission, onward)   None       Assessment/Plan HTN HLD Hx of diverticulitis T2DM Chronic venous insufficiency LLE Cellulitis Morbid obesity  Readmission for SBO likely 2/2 large ventral hernia - Pt was discharged 5/5  and began having N&V 5/6 morning following an episode of diarrhea. She states little flatus since vomiting started - very large ventral hernia that is ?reduced - KUB this AM with non-obstructive bowel gas pattern and contrast in the R colon  - NGT with >1.5 L out overnight - clamping trials and check residuals, would not d/c NGT with high output and no flatus Hypokalemia - K 3.4, replace IV Hypomagnesemia - 1.3 yesterday, replace  FEN: NPO, IVF, NGT intermittent clamping  VTE: SCD's, 60 mg lovenox daily  ID: none Foley:  none Follow up: TBD  DISPO: clamping trials. May need hernia repair this admission, will discuss with MD.     LOS: 1 day    Brigid Re , [facility] 07/28/2017, 8:31 AM Pager: [phone] Consults: [phone] Mon-Fri 7:00 am-4:30 pm Sat-Sun 7:00 am-11:30 am

## 2017-07-29 ENCOUNTER — Inpatient Hospital Stay (HOSPITAL_COMMUNITY): Payer: Medicare HMO

## 2017-07-29 DIAGNOSIS — E160 Drug-induced hypoglycemia without coma: Secondary | ICD-10-CM

## 2017-07-29 DIAGNOSIS — T383X5A Adverse effect of insulin and oral hypoglycemic [antidiabetic] drugs, initial encounter: Secondary | ICD-10-CM

## 2017-07-29 LAB — CBC
HCT: 41.5 % (ref 36.0–46.0)
Hemoglobin: 13.4 g/dL (ref 12.0–15.0)
MCH: 30 pg (ref 26.0–34.0)
MCHC: 32.3 g/dL (ref 30.0–36.0)
MCV: 92.8 fL (ref 78.0–100.0)
PLATELETS: 230 10*3/uL (ref 150–400)
RBC: 4.47 MIL/uL (ref 3.87–5.11)
RDW: 14.2 % (ref 11.5–15.5)
WBC: 6.1 10*3/uL (ref 4.0–10.5)

## 2017-07-29 LAB — BASIC METABOLIC PANEL
Anion gap: 10 (ref 5–15)
BUN: 12 mg/dL (ref 6–20)
CALCIUM: 7.9 mg/dL — AB (ref 8.9–10.3)
CO2: 22 mmol/L (ref 22–32)
CREATININE: 0.74 mg/dL (ref 0.44–1.00)
Chloride: 108 mmol/L (ref 101–111)
GFR calc Af Amer: >60 mL/min (ref >60)
GLUCOSE: 87 mg/dL (ref 65–99)
Potassium: 3.6 mmol/L (ref 3.5–5.1)
Sodium: 140 mmol/L (ref 135–145)

## 2017-07-29 LAB — GLUCOSE, CAPILLARY
GLUCOSE-CAPILLARY: 141 mg/dL — AB (ref 65–99)
GLUCOSE-CAPILLARY: 166 mg/dL — AB (ref 65–99)
GLUCOSE-CAPILLARY: 54 mg/dL — AB (ref 65–99)
Glucose-Capillary: 113 mg/dL — ABNORMAL HIGH (ref 65–99)
Glucose-Capillary: 138 mg/dL — ABNORMAL HIGH (ref 65–99)
Glucose-Capillary: 154 mg/dL — ABNORMAL HIGH (ref 65–99)
Glucose-Capillary: 157 mg/dL — ABNORMAL HIGH (ref 65–99)
Glucose-Capillary: 175 mg/dL — ABNORMAL HIGH (ref 65–99)
Glucose-Capillary: 55 mg/dL — ABNORMAL LOW (ref 65–99)
Glucose-Capillary: 60 mg/dL — ABNORMAL LOW (ref 65–99)
Glucose-Capillary: 74 mg/dL (ref 65–99)

## 2017-07-29 LAB — MAGNESIUM: Magnesium: 1.6 mg/dL — ABNORMAL LOW (ref 1.7–2.4)

## 2017-07-29 MED ORDER — MAGNESIUM SULFATE 2 GM/50ML IV SOLN
2.0000 g | Freq: Once | INTRAVENOUS | Status: AC
  Administered 2017-07-29: 2 g via INTRAVENOUS
  Filled 2017-07-29 (×2): qty 50

## 2017-07-29 MED ORDER — DEXTROSE 50 % IV SOLN
1.0000 | Freq: Once | INTRAVENOUS | Status: AC
  Administered 2017-07-29: 50 mL via INTRAVENOUS
  Filled 2017-07-29: qty 50

## 2017-07-29 MED ORDER — POTASSIUM CHLORIDE 10 MEQ/100ML IV SOLN
10.0000 meq | INTRAVENOUS | Status: AC
  Administered 2017-07-29 (×4): 10 meq via INTRAVENOUS
  Filled 2017-07-29: qty 100

## 2017-07-29 MED ORDER — METRONIDAZOLE IN NACL 5-0.79 MG/ML-% IV SOLN
500.0000 mg | Freq: Once | INTRAVENOUS | Status: AC
  Administered 2017-07-30: 500 mg via INTRAVENOUS
  Filled 2017-07-29: qty 100

## 2017-07-29 MED ORDER — DEXTROSE 50 % IV SOLN
INTRAVENOUS | Status: AC
  Administered 2017-07-29: 25 mL
  Filled 2017-07-29: qty 50

## 2017-07-29 MED ORDER — INSULIN ASPART 100 UNIT/ML ~~LOC~~ SOLN
0.0000 [IU] | SUBCUTANEOUS | Status: DC
  Administered 2017-07-29 (×2): 2 [IU] via SUBCUTANEOUS
  Administered 2017-07-30: 1 [IU] via SUBCUTANEOUS
  Administered 2017-07-30: 2 [IU] via SUBCUTANEOUS

## 2017-07-29 MED ORDER — INSULIN NPH (HUMAN) (ISOPHANE) 100 UNIT/ML ~~LOC~~ SUSP
20.0000 [IU] | Freq: Two times a day (BID) | SUBCUTANEOUS | Status: DC
  Administered 2017-07-29 – 2017-07-30 (×2): 20 [IU] via SUBCUTANEOUS
  Filled 2017-07-29: qty 10

## 2017-07-29 MED ORDER — CIPROFLOXACIN IN D5W 400 MG/200ML IV SOLN
400.0000 mg | Freq: Once | INTRAVENOUS | Status: AC
  Administered 2017-07-30: 400 mg via INTRAVENOUS
  Filled 2017-07-29: qty 200

## 2017-07-29 MED ORDER — KCL IN DEXTROSE-NACL 20-5-0.45 MEQ/L-%-% IV SOLN
INTRAVENOUS | Status: DC
  Administered 2017-07-29 – 2017-07-31 (×5): via INTRAVENOUS
  Filled 2017-07-29 (×8): qty 1000

## 2017-07-29 NOTE — Progress Notes (Signed)
[facility] Progress Note     Subjective: CC: sbo  Patient tolerated clamping trials and is passing flatus. Somewhat high output again but patient had 4 cups of ice chips. Denies abdominal pain. Discussed potential hernia repair, patient seems slightly resistant to idea that weight complicates this.   Objective: Vital signs in last 24 hours: Temp:  [98.1 F (36.7 C)-98.5 F (36.9 C)] 98.3 F (36.8 C) (05/09 0555) Pulse Rate:  [78-85] 78 (05/09 0555) Resp:  [15-18] 18 (05/09 0555) BP: (120-144)/(65-73) 144/65 (05/09 0555) SpO2:  [96 %-99 %] 96 % (05/09 0555) Last BM Date: 07/24/17  Intake/Output from previous day: 05/08 0701 - 05/09 0700 In: 2400 [I.V.:2300; IV Piggyback:100] Out: 1160 [Urine:750; Emesis/NG output:410] Intake/Output this shift: No intake/output data recorded.  PE: Gen:  Alert, NAD, pleasant Card:  Regular rate and rhythm, pedal pulses 2+ BL Pulm:  Normal effort, clear to auscultation bilaterally Abd: Soft, non-tender, non-distended, bowel sounds present, ventral hernia without skin changes or signs of incarceration  Skin: warm and dry, no rashes  Psych: A&Ox3   Lab Results:  Recent Labs    07/28/17 0612 07/29/17 0531  WBC 7.7 6.1  HGB 13.1 13.4  HCT 40.5 41.5  PLT 232 230   BMET Recent Labs    07/28/17 0612 07/29/17 0531  NA 141 140  K 3.4* 3.6  CL 105 108  CO2 26 22  GLUCOSE 158* 87  BUN 16 12  CREATININE 0.95 0.74  CALCIUM 8.1* 7.9*   PT/INR No results for input(s): LABPROT, INR in the last 72 hours. CMP     Component Value Date/Time   NA 140 07/29/2017 0531   NA 143 07/08/2015 1023   K 3.6 07/29/2017 0531   CL 108 07/29/2017 0531   CO2 22 07/29/2017 0531   GLUCOSE 87 07/29/2017 0531   BUN 12 07/29/2017 0531   BUN 21 07/08/2015 1023   CREATININE 0.74 07/29/2017 0531   CREATININE 0.86 05/22/2016 0903   CALCIUM 7.9 (L) 07/29/2017 0531   PROT 6.6 07/28/2017 0612   PROT 6.5 10/03/2013 0920   ALBUMIN 3.1 (L)  07/28/2017 0612   ALBUMIN 4.3 10/03/2013 0920   AST 22 07/28/2017 0612   ALT 28 07/28/2017 0612   ALKPHOS 66 07/28/2017 0612   BILITOT 0.9 07/28/2017 0612   GFRNONAA >60 07/29/2017 0531   GFRNONAA 69 05/22/2016 0903   GFRAA >60 07/29/2017 0531   GFRAA 80 05/22/2016 0903   Lipase     Component Value Date/Time   LIPASE 21 07/27/2017 1039       Studies/Results: Dg Abdomen Acute W/chest  Result Date: 07/27/2017 CLINICAL DATA:  Discharge from the hospital 2 days ago for bowel obstruction. Persistent vomiting. EXAM: DG ABDOMEN ACUTE W/ 1V CHEST COMPARISON:  Abdominal radiograph of Jul 23, 2017 FINDINGS: The lungs are well-expanded and clear. The heart and pulmonary vascularity are normal. There is calcification in the wall of the aortic arch. Within the abdomen there is scattered air-fluid levels in both small and large bowel. There is some gas and fluid in the stomach without significant distention. There is gas and stool in the rectum. There is biliary tract gas presumably from previous sphincterotomy. There surgical clips in the gallbladder fossa. IMPRESSION: Findings likely reflect a persistent partial mid to distal small bowel obstruction versus ileus. No evidence of perforation. Electronically Signed   By: David  Martinique M.D.   On: 07/27/2017 11:30   Dg Abd Portable 1v  Result Date: 07/29/2017 CLINICAL DATA:  Small-bowel obstruction EXAM: PORTABLE ABDOMEN - 1 VIEW COMPARISON:  07/28/2017 FINDINGS: NG tube within the stomach which is collapsed. Small bowel dilatation in the left lower abdomen measures 4.8 cm in diameter. There is contrast in the right colon. Remote cholecystectomy noted. IMPRESSION: NG tube within the stomach. Residual small bowel dilatation but contrast has progressed into the colon. Electronically Signed   By: Jerilynn Mages.  Shick M.D.   On: 07/29/2017 07:53   Dg Abd Portable 1v-small Bowel Obstruction Protocol-initial, 8 Hr Delay  Result Date: 07/28/2017 CLINICAL DATA:  Initial  evaluation for enteric tube placement. EXAM: PORTABLE ABDOMEN - 1 VIEW COMPARISON:  Prior radiograph from 07/27/2017. FINDINGS: Enteric tube in place with tip in side hole overlying the stomach, well beyond the GE junction. Tip projects inferiorly. Bowel gas pattern within normal limits without obstruction. Enteric contrast material within the right colon. Cholecystectomy clips noted. Suture material overlies the right abdomen. No soft tissue mass or abnormal calcification. Visualized osseous structures within normal limits. IMPRESSION: 1. Enteric tube in place with tip in side hole overlying the stomach, well beyond the GE junction. 2. Nonobstructive bowel gas pattern with enteric contrast material within the right colon. Electronically Signed   By: Jeannine Boga M.D.   On: 07/28/2017 01:08   Dg Abd Portable 1v-small Bowel Protocol-position Verification  Result Date: 07/27/2017 CLINICAL DATA:  Nasogastric tube placement EXAM: PORTABLE ABDOMEN - 1 VIEW COMPARISON:  Chest radiograph 07/27/2017 FINDINGS: Nasogastric tube tip and side port below the diaphragm. The side port is approximately 2.5 cm distal to the expected location of the gastroesophageal junction. This could be advanced by 4-5 cm. Visualized lungs are clear. No dilated small bowel. IMPRESSION: NG tube side port approximately 2 cm distal to the GE junction. This could be advanced by 4-5 cm for optimal positioning. Electronically Signed   By: Ulyses Jarred M.D.   On: 07/27/2017 15:21    Anti-infectives: Anti-infectives (From admission, onward)   None       Assessment/Plan HTN HLD Hx of diverticulitis T2DM - hypoglycemia this AM, IVF changed to D5 1/2 NS Chronic venous insufficiency LLE Cellulitis Morbid obesity  Readmission for SBO likely 2/2 large ventral hernia - Pt was discharged 5/5  and began having N&V 5/6 morning following an episode of diarrhea - very large ventral hernia that is ?reduced - KUB this AM with  non-obstructive bowel gas pattern and contrast in the colon  - NGT with moderate bilious drainage, passing flatus  - clamping trial with sips of clears  Hypokalemia - K 3.6, replace to goal of 4.0 Hypomagnesemia - 1.6 yesterday, replace  FBP:ZWCH of clears from floor stock, IVF, NGT clamp VTE: SCD's,60 mg lovenox daily  EN:IDPO Foley:none Follow up:TBD  DISPO:clamping trial with sips of clears. May require hernia repair this admission vs elective hernia repair outpatient if improves.     LOS: 2 days    Brigid Re , [facility] 07/29/2017, 8:28 AM Pager: [phone] Consults: [phone] Mon-Fri 7:00 am-4:30 pm Sat-Sun 7:00 am-11:30 am

## 2017-07-29 NOTE — Progress Notes (Signed)
[facility] PROGRESS NOTE    Progress Note  Debra [name]  IRS:[id number] DOB: Aug 25, 1945 DOA: 07/27/2017 PCP: Vicenta Aly, FNP     Brief Narrative:   Debra [name] is an 72 y.o. female past medical history of diabetes, essential hypertension, morbid obesity recently admitted and discharged on 07/25/2017 for small bowel obstruction with ventral large hernia treated conservatively comes in again with nausea vomiting and no bowel movements for over 24 hours, found to have a small bowel obstruction.  Assessment/Plan:   Recurrent SBO (small bowel obstruction) in the setting of a ventral hernia: NG tube placed, with intermittent suction she is consuming large amounts of Continue IV fluids and electrolyte repletion. Abdominal x-ray shows improving bowel gas dilation, continues to pass gas no bowel movement. NG tube clamped cont. ice chips today appreciate surgery's assistance.  Type 2 diabetes, uncontrolled, with neuropathy (HCC) Glucose removed today, will change her IV fluids to D5, decrease NPH, change CBGs every 4 continue sliding scale insulin  Essential hypertension Continue metoprolol IV.  Blood pressure seems to be stable.  GERD (gastroesophageal reflux disease) Continue Pepcid IV.  Severe obesity (BMI >= 40) (HCC) Counseling.  Hyperlipidemia associated with type 2 diabetes mellitus ([place]) Hold statins, until she is able to tolerate diet   DVT prophylaxis: lovenox  Family Communication:none Disposition Plan/Barrier to D/C: home in 2 days Code Status:     Code Status Orders  (From admission, onward)        Start     Ordered   07/27/17 1247  Full code  Continuous     07/27/17 1247    Code Status History    Date Active Date Inactive Code Status Order ID Comments User Context   07/22/2017 1738 07/25/2017 1355 Full Code [id number]  Cristal Ford, DO ED   04/14/2013 1451 04/19/2013 1346 Full Code [id number]  Fanny Skates, MD Inpatient   04/13/2013 1054  04/14/2013 1451 Full Code [id number]  Earnstine Regal, PA-C ED        IV Access:    Peripheral IV   Procedures and diagnostic studies:   Dg Abdomen Acute W/chest  Result Date: 07/27/2017 CLINICAL DATA:  Discharge from the hospital 2 days ago for bowel obstruction. Persistent vomiting. EXAM: DG ABDOMEN ACUTE W/ 1V CHEST COMPARISON:  Abdominal radiograph of Jul 23, 2017 FINDINGS: The lungs are well-expanded and clear. The heart and pulmonary vascularity are normal. There is calcification in the wall of the aortic arch. Within the abdomen there is scattered air-fluid levels in both small and large bowel. There is some gas and fluid in the stomach without significant distention. There is gas and stool in the rectum. There is biliary tract gas presumably from previous sphincterotomy. There surgical clips in the gallbladder fossa. IMPRESSION: Findings likely reflect a persistent partial mid to distal small bowel obstruction versus ileus. No evidence of perforation. Electronically Signed   By: David  Martinique M.D.   On: 07/27/2017 11:30   Dg Abd Portable 1v  Result Date: 07/29/2017 CLINICAL DATA:  Small-bowel obstruction EXAM: PORTABLE ABDOMEN - 1 VIEW COMPARISON:  07/28/2017 FINDINGS: NG tube within the stomach which is collapsed. Small bowel dilatation in the left lower abdomen measures 4.8 cm in diameter. There is contrast in the right colon. Remote cholecystectomy noted. IMPRESSION: NG tube within the stomach. Residual small bowel dilatation but contrast has progressed into the colon. Electronically Signed   By: Jerilynn Mages.  Shick M.D.   On: 07/29/2017 07:53   Dg Abd Portable 1v-small Bowel Obstruction  Protocol-initial, 8 Hr Delay  Result Date: 07/28/2017 CLINICAL DATA:  Initial evaluation for enteric tube placement. EXAM: PORTABLE ABDOMEN - 1 VIEW COMPARISON:  Prior radiograph from 07/27/2017. FINDINGS: Enteric tube in place with tip in side hole overlying the stomach, well beyond the GE junction. Tip  projects inferiorly. Bowel gas pattern within normal limits without obstruction. Enteric contrast material within the right colon. Cholecystectomy clips noted. Suture material overlies the right abdomen. No soft tissue mass or abnormal calcification. Visualized osseous structures within normal limits. IMPRESSION: 1. Enteric tube in place with tip in side hole overlying the stomach, well beyond the GE junction. 2. Nonobstructive bowel gas pattern with enteric contrast material within the right colon. Electronically Signed   By: Jeannine Boga M.D.   On: 07/28/2017 01:08   Dg Abd Portable 1v-small Bowel Protocol-position Verification  Result Date: 07/27/2017 CLINICAL DATA:  Nasogastric tube placement EXAM: PORTABLE ABDOMEN - 1 VIEW COMPARISON:  Chest radiograph 07/27/2017 FINDINGS: Nasogastric tube tip and side port below the diaphragm. The side port is approximately 2.5 cm distal to the expected location of the gastroesophageal junction. This could be advanced by 4-5 cm. Visualized lungs are clear. No dilated small bowel. IMPRESSION: NG tube side port approximately 2 cm distal to the GE junction. This could be advanced by 4-5 cm for optimal positioning. Electronically Signed   By: Ulyses Jarred M.D.   On: 07/27/2017 15:21     Medical Consultants:    None.  Anti-Infectives:   None  Subjective:    Debra [name] no complaints she denies any abdominal pain.  This morning she is hungry.  We will like to try some regular food.  Objective:    Vitals:   07/28/17 1737 07/28/17 2151 07/29/17 0023 07/29/17 0555  BP: 120/69 (!) 141/73 139/66 (!) 144/65  Pulse: 81 81 80 78  Resp:  18  18  Temp:  98.5 F (36.9 C)  98.3 F (36.8 C)  TempSrc:  Oral  Oral  SpO2:  98%  96%  Weight:      Height:        Intake/Output Summary (Last 24 hours) at 07/29/2017 0757 Last data filed at 07/29/2017 0600 Gross per 24 hour  Intake 2400 ml  Output 1060 ml  Net 1340 ml   Filed Weights   07/27/17 1242    Weight: 120.7 kg (266 lb)    Exam: General exam: In no acute distress. Respiratory system: Good air movement and clear to auscultation. Cardiovascular system: S1 & S2 heard, RRR.   Gastrointestinal system: Positive bowel sounds soft nontender nondistended. Central nervous system: Alert and oriented. No focal neurological deficits. Extremities: No pedal edema. Skin: No rashes, lesions or ulcers Psychiatry: Judgement and insight appear normal. Mood & affect appropriate.    Data Reviewed:    Labs: Basic Metabolic Panel: Recent Labs  Lab 07/24/17 0454 07/25/17 0420 07/27/17 1039 07/28/17 0612 07/29/17 0531  NA 139 140 138 141 140  K 3.3* 3.8 4.3 3.4* 3.6  CL 108 109 104 105 108  CO2 22 23 22 26 22   GLUCOSE 179* 218* 296* 158* 87  BUN 16 9 15 16 12   CREATININE 0.73 0.62 0.88 0.95 0.74  CALCIUM 8.0* 7.9* 9.0 8.1* 7.9*  MG  --   --  1.3*  --  1.6*   GFR Estimated Creatinine Clearance: 83.8 mL/min (by C-G formula based on SCr of 0.74 mg/dL). Liver Function Tests: Recent Labs  Lab 07/22/17 1332 07/23/17 0506 07/27/17 1039 07/28/17  0612  AST 24 20 37 22  ALT 20 17 34 28  ALKPHOS 87 68 83 66  BILITOT 1.2 1.0 1.4* 0.9  PROT 7.6 6.7 7.8 6.6  ALBUMIN 4.0 3.4* 3.8 3.1*   Recent Labs  Lab 07/22/17 1332 07/27/17 1039  LIPASE 23 21   No results for input(s): AMMONIA in the last 168 hours. Coagulation profile No results for input(s): INR, PROTIME in the last 168 hours.  CBC: Recent Labs  Lab 07/22/17 1911 07/23/17 0506 07/27/17 1039 07/28/17 0612 07/29/17 0531  WBC 9.8 7.7 8.2 7.7 6.1  NEUTROABS  --   --  5.6  --   --   HGB 15.8* 14.2 15.1* 13.1 13.4  HCT 47.3* 42.9 44.3 40.5 41.5  MCV 90.1 90.9 90.8 92.5 92.8  PLT 279 252 270 232 230   Cardiac Enzymes: No results for input(s): CKTOTAL, CKMB, CKMBINDEX, TROPONINI in the last 168 hours. BNP (last 3 results) No results for input(s): PROBNP in the last 8760 hours. CBG: Recent Labs  Lab 07/29/17 0019  07/29/17 0040 07/29/17 0132 07/29/17 0551 07/29/17 0751  GLUCAP 55* 54* 141* 74 60*   D-Dimer: No results for input(s): DDIMER in the last 72 hours. Hgb A1c: Recent Labs    07/27/17 1039  HGBA1C 8.9*   Lipid Profile: No results for input(s): CHOL, HDL, LDLCALC, TRIG, CHOLHDL, LDLDIRECT in the last 72 hours. Thyroid function studies: No results for input(s): TSH, T4TOTAL, T3FREE, THYROIDAB in the last 72 hours.  Invalid input(s): FREET3 Anemia work up: No results for input(s): VITAMINB12, FOLATE, FERRITIN, TIBC, IRON, RETICCTPCT in the last 72 hours. Sepsis Labs: Recent Labs  Lab 07/23/17 0506 07/27/17 1039 07/28/17 0612 07/29/17 0531  WBC 7.7 8.2 7.7 6.1   Microbiology No results found for this or any previous visit (from the past 240 hour(s)).   Medications:   . enoxaparin (LOVENOX) injection  60 mg Subcutaneous Q24H  . insulin aspart  0-9 Units Subcutaneous Q6H  . insulin NPH Human  35 Units Subcutaneous BID AC & HS  . metoprolol tartrate  5 mg Intravenous Q6H   Continuous Infusions: . sodium chloride 100 mL/hr at 07/28/17 2137  . famotidine (PEPCID) IV Stopped (07/28/17 2209)  . magnesium sulfate 1 - 4 g bolus IVPB    . potassium chloride        LOS: 2 days   Charlynne Cousins  [facility] Pager [phone]  *Please refer to [email or web address], password [email or web address] to get updated schedule on who will round on this patient, as hospitalists switch teams weekly. If 7PM-7AM, please contact night-coverage at [email or web address], password [email or web address] for any overnight needs.  07/29/2017, 7:57 AM

## 2017-07-29 NOTE — Progress Notes (Signed)
Plan for open hernia repair tomorrow AM. Pre-op orders placed. NPO after MN, obtain consent. HOLD LOVENOX. Check AM labs. Abx on call to OR in AM.  Brigid Re , [facility] 07/29/2017, 12:48 PM Pager: [phone] Mon-Fri 7:00 am-4:30 pm Sat-Sun 7:00 am-11:30 am

## 2017-07-30 ENCOUNTER — Encounter (HOSPITAL_COMMUNITY)
Admission: EM | Disposition: A | Discharge status: Home Health | Payer: Self-pay | Source: Home / Self Care | Attending: Internal Medicine

## 2017-07-30 ENCOUNTER — Inpatient Hospital Stay (HOSPITAL_COMMUNITY): Payer: Medicare HMO | Admitting: Certified Registered Nurse Anesthetist

## 2017-07-30 ENCOUNTER — Encounter (HOSPITAL_COMMUNITY): Payer: Self-pay | Admitting: Anesthesiology

## 2017-07-30 HISTORY — PX: INSERTION OF MESH: SHX5868

## 2017-07-30 HISTORY — PX: LYSIS OF ADHESION: SHX5961

## 2017-07-30 HISTORY — PX: OMENTECTOMY: SHX5985

## 2017-07-30 HISTORY — PX: LAPAROTOMY: SHX154

## 2017-07-30 HISTORY — PX: VENTRAL HERNIA REPAIR: SHX424

## 2017-07-30 LAB — BASIC METABOLIC PANEL
ANION GAP: 10 (ref 5–15)
BUN: 6 mg/dL (ref 6–20)
CALCIUM: 7.8 mg/dL — AB (ref 8.9–10.3)
CHLORIDE: 106 mmol/L (ref 101–111)
CO2: 24 mmol/L (ref 22–32)
CREATININE: 0.67 mg/dL (ref 0.44–1.00)
GFR calc non Af Amer: >60 mL/min (ref >60)
Glucose, Bld: 179 mg/dL — ABNORMAL HIGH (ref 65–99)
Potassium: 3.7 mmol/L (ref 3.5–5.1)
SODIUM: 140 mmol/L (ref 135–145)

## 2017-07-30 LAB — GLUCOSE, CAPILLARY
GLUCOSE-CAPILLARY: 166 mg/dL — AB (ref 65–99)
GLUCOSE-CAPILLARY: 143 mg/dL — AB (ref 65–99)
GLUCOSE-CAPILLARY: 249 mg/dL — AB (ref 65–99)
Glucose-Capillary: 142 mg/dL — ABNORMAL HIGH (ref 65–99)
Glucose-Capillary: 158 mg/dL — ABNORMAL HIGH (ref 65–99)
Glucose-Capillary: 208 mg/dL — ABNORMAL HIGH (ref 65–99)

## 2017-07-30 LAB — CBC
HCT: 36.8 % (ref 36.0–46.0)
Hemoglobin: 12.2 g/dL (ref 12.0–15.0)
MCH: 30.3 pg (ref 26.0–34.0)
MCHC: 33.2 g/dL (ref 30.0–36.0)
MCV: 91.5 fL (ref 78.0–100.0)
PLATELETS: 248 10*3/uL (ref 150–400)
RBC: 4.02 MIL/uL (ref 3.87–5.11)
RDW: 14.2 % (ref 11.5–15.5)
WBC: 5.4 10*3/uL (ref 4.0–10.5)

## 2017-07-30 LAB — SURGICAL PCR SCREEN
MRSA, PCR: NEGATIVE
Staphylococcus aureus: NEGATIVE

## 2017-07-30 SURGERY — REPAIR, HERNIA, VENTRAL
Anesthesia: General | Site: Abdomen

## 2017-07-30 MED ORDER — DEXAMETHASONE SODIUM PHOSPHATE 10 MG/ML IJ SOLN
INTRAMUSCULAR | Status: AC
  Filled 2017-07-30: qty 1

## 2017-07-30 MED ORDER — FENTANYL CITRATE (PF) 100 MCG/2ML IJ SOLN
INTRAMUSCULAR | Status: AC
  Filled 2017-07-30: qty 2

## 2017-07-30 MED ORDER — FENTANYL CITRATE (PF) 100 MCG/2ML IJ SOLN
25.0000 ug | INTRAMUSCULAR | Status: DC | PRN
  Administered 2017-07-30: 50 ug via INTRAVENOUS

## 2017-07-30 MED ORDER — 0.9 % SODIUM CHLORIDE (POUR BTL) OPTIME
TOPICAL | Status: DC | PRN
  Administered 2017-07-30: 1000 mL

## 2017-07-30 MED ORDER — FENTANYL CITRATE (PF) 100 MCG/2ML IJ SOLN
INTRAMUSCULAR | Status: AC
  Administered 2017-07-30: 50 ug via INTRAVENOUS
  Filled 2017-07-30: qty 4

## 2017-07-30 MED ORDER — POVIDONE-IODINE 10 % EX OINT
TOPICAL_OINTMENT | CUTANEOUS | Status: AC
  Filled 2017-07-30: qty 28.35

## 2017-07-30 MED ORDER — CHLORHEXIDINE GLUCONATE CLOTH 2 % EX PADS
6.0000 | MEDICATED_PAD | Freq: Once | CUTANEOUS | Status: AC
Start: 2017-07-30 — End: 2017-07-30
  Administered 2017-07-30: 6 via TOPICAL

## 2017-07-30 MED ORDER — LIDOCAINE 2% (20 MG/ML) 5 ML SYRINGE
INTRAMUSCULAR | Status: AC
  Filled 2017-07-30: qty 5

## 2017-07-30 MED ORDER — MORPHINE SULFATE (PF) 4 MG/ML IV SOLN
1.0000 mg | INTRAVENOUS | Status: DC | PRN
  Administered 2017-07-30 – 2017-07-31 (×5): 2 mg via INTRAVENOUS
  Administered 2017-07-31 – 2017-08-01 (×2): 4 mg via INTRAVENOUS
  Administered 2017-08-01: 2 mg via INTRAVENOUS
  Administered 2017-08-01 (×2): 4 mg via INTRAVENOUS
  Administered 2017-08-01: 2 mg via INTRAVENOUS
  Administered 2017-08-02: 4 mg via INTRAVENOUS
  Administered 2017-08-03: 2 mg via INTRAVENOUS
  Administered 2017-08-03 – 2017-08-05 (×2): 4 mg via INTRAVENOUS
  Filled 2017-07-30 (×16): qty 1

## 2017-07-30 MED ORDER — SUCCINYLCHOLINE CHLORIDE 200 MG/10ML IV SOSY
PREFILLED_SYRINGE | INTRAVENOUS | Status: AC
  Filled 2017-07-30: qty 10

## 2017-07-30 MED ORDER — LIDOCAINE 2% (20 MG/ML) 5 ML SYRINGE
INTRAMUSCULAR | Status: DC | PRN
  Administered 2017-07-30: 100 mg via INTRAVENOUS

## 2017-07-30 MED ORDER — PHENYLEPHRINE HCL 10 MG/ML IJ SOLN
INTRAMUSCULAR | Status: AC
  Filled 2017-07-30: qty 1

## 2017-07-30 MED ORDER — ONDANSETRON HCL 4 MG/2ML IJ SOLN
INTRAMUSCULAR | Status: AC
  Administered 2017-07-30: 4 mg via INTRAVENOUS
  Filled 2017-07-30: qty 2

## 2017-07-30 MED ORDER — PHENYLEPHRINE 40 MCG/ML (10ML) SYRINGE FOR IV PUSH (FOR BLOOD PRESSURE SUPPORT)
PREFILLED_SYRINGE | INTRAVENOUS | Status: AC
  Filled 2017-07-30: qty 10

## 2017-07-30 MED ORDER — ONDANSETRON HCL 4 MG/2ML IJ SOLN
INTRAMUSCULAR | Status: AC
  Filled 2017-07-30: qty 2

## 2017-07-30 MED ORDER — LIDOCAINE 2% (20 MG/ML) 5 ML SYRINGE
INTRAMUSCULAR | Status: DC | PRN
  Administered 2017-07-30: 1.5 mg/kg/h via INTRAVENOUS

## 2017-07-30 MED ORDER — SUGAMMADEX SODIUM 500 MG/5ML IV SOLN
INTRAVENOUS | Status: DC | PRN
  Administered 2017-07-30: 250 mg via INTRAVENOUS

## 2017-07-30 MED ORDER — PROPOFOL 10 MG/ML IV BOLUS
INTRAVENOUS | Status: DC | PRN
  Administered 2017-07-30: 140 mg via INTRAVENOUS

## 2017-07-30 MED ORDER — PHENYLEPHRINE HCL 10 MG/ML IJ SOLN
INTRAVENOUS | Status: DC | PRN
  Administered 2017-07-30: 50 ug/min via INTRAVENOUS

## 2017-07-30 MED ORDER — KETAMINE HCL 10 MG/ML IJ SOLN
INTRAMUSCULAR | Status: DC | PRN
  Administered 2017-07-30: 25 mg via INTRAVENOUS

## 2017-07-30 MED ORDER — INSULIN NPH (HUMAN) (ISOPHANE) 100 UNIT/ML ~~LOC~~ SUSP
15.0000 [IU] | Freq: Two times a day (BID) | SUBCUTANEOUS | Status: DC
  Administered 2017-07-30: 15 [IU] via SUBCUTANEOUS

## 2017-07-30 MED ORDER — MIDAZOLAM HCL 2 MG/2ML IJ SOLN
INTRAMUSCULAR | Status: AC
  Filled 2017-07-30: qty 2

## 2017-07-30 MED ORDER — PHENYLEPHRINE 40 MCG/ML (10ML) SYRINGE FOR IV PUSH (FOR BLOOD PRESSURE SUPPORT)
PREFILLED_SYRINGE | INTRAVENOUS | Status: DC | PRN
  Administered 2017-07-30 (×2): 80 ug via INTRAVENOUS
  Administered 2017-07-30: 120 ug via INTRAVENOUS
  Administered 2017-07-30: 80 ug via INTRAVENOUS
  Administered 2017-07-30: 120 ug via INTRAVENOUS
  Administered 2017-07-30: 40 ug via INTRAVENOUS

## 2017-07-30 MED ORDER — BUPIVACAINE-EPINEPHRINE 0.5% -1:200000 IJ SOLN
INTRAMUSCULAR | Status: AC
  Filled 2017-07-30: qty 1

## 2017-07-30 MED ORDER — SUCCINYLCHOLINE CHLORIDE 200 MG/10ML IV SOSY
PREFILLED_SYRINGE | INTRAVENOUS | Status: DC | PRN
  Administered 2017-07-30: 110 mg via INTRAVENOUS

## 2017-07-30 MED ORDER — SUGAMMADEX SODIUM 500 MG/5ML IV SOLN
INTRAVENOUS | Status: AC
  Filled 2017-07-30: qty 5

## 2017-07-30 MED ORDER — ROCURONIUM BROMIDE 10 MG/ML (PF) SYRINGE
PREFILLED_SYRINGE | INTRAVENOUS | Status: AC
  Filled 2017-07-30: qty 5

## 2017-07-30 MED ORDER — LACTATED RINGERS IV SOLN
INTRAVENOUS | Status: DC
  Administered 2017-07-30 (×3): via INTRAVENOUS

## 2017-07-30 MED ORDER — LIDOCAINE 2% (20 MG/ML) 5 ML SYRINGE
INTRAMUSCULAR | Status: AC
  Filled 2017-07-30: qty 10

## 2017-07-30 MED ORDER — BUPIVACAINE HCL (PF) 0.25 % IJ SOLN
INTRAMUSCULAR | Status: AC
  Filled 2017-07-30: qty 30

## 2017-07-30 MED ORDER — EPHEDRINE 5 MG/ML INJ
INTRAVENOUS | Status: AC
  Filled 2017-07-30: qty 10

## 2017-07-30 MED ORDER — DEXAMETHASONE SODIUM PHOSPHATE 4 MG/ML IJ SOLN
INTRAMUSCULAR | Status: DC | PRN
  Administered 2017-07-30: 10 mg via INTRAVENOUS

## 2017-07-30 MED ORDER — ROCURONIUM BROMIDE 50 MG/5ML IV SOSY
PREFILLED_SYRINGE | INTRAVENOUS | Status: DC | PRN
  Administered 2017-07-30: 10 mg via INTRAVENOUS
  Administered 2017-07-30: 5 mg via INTRAVENOUS
  Administered 2017-07-30: 10 mg via INTRAVENOUS
  Administered 2017-07-30: 50 mg via INTRAVENOUS

## 2017-07-30 MED ORDER — EPHEDRINE SULFATE-NACL 50-0.9 MG/10ML-% IV SOSY
PREFILLED_SYRINGE | INTRAVENOUS | Status: DC | PRN
  Administered 2017-07-30: 5 mg via INTRAVENOUS

## 2017-07-30 MED ORDER — FENTANYL CITRATE (PF) 250 MCG/5ML IJ SOLN
INTRAMUSCULAR | Status: AC
  Filled 2017-07-30: qty 5

## 2017-07-30 MED ORDER — FENTANYL CITRATE (PF) 100 MCG/2ML IJ SOLN
INTRAMUSCULAR | Status: DC | PRN
  Administered 2017-07-30 (×2): 50 ug via INTRAVENOUS

## 2017-07-30 MED ORDER — HYDROMORPHONE HCL 2 MG/ML IJ SOLN
INTRAMUSCULAR | Status: AC
  Filled 2017-07-30: qty 1

## 2017-07-30 MED ORDER — ONDANSETRON HCL 4 MG/2ML IJ SOLN
4.0000 mg | Freq: Once | INTRAMUSCULAR | Status: AC | PRN
  Administered 2017-07-30: 4 mg via INTRAVENOUS

## 2017-07-30 SURGICAL SUPPLY — 45 items
BINDER ABDOMINAL 12 ML 46-62 (SOFTGOODS) IMPLANT
BLADE HEX COATED 2.75 (ELECTRODE) ×2 IMPLANT
DECANTER SPIKE VIAL GLASS SM (MISCELLANEOUS) IMPLANT
DRAIN CHANNEL 19F RND (DRAIN) ×1 IMPLANT
DRAPE LAPAROSCOPIC ABDOMINAL (DRAPES) ×2 IMPLANT
DRSG OPSITE POSTOP 4X12 (GAUZE/BANDAGES/DRESSINGS) ×1 IMPLANT
DRSG TEGADERM 4X4.75 (GAUZE/BANDAGES/DRESSINGS) ×1 IMPLANT
ELECT REM PT RETURN 15FT ADLT (MISCELLANEOUS) ×2 IMPLANT
EVACUATOR SILICONE 100CC (DRAIN) ×1 IMPLANT
GAUZE SPONGE 4X4 12PLY STRL (GAUZE/BANDAGES/DRESSINGS) ×2 IMPLANT
GLOVE BIO SURGEON STRL SZ7.5 (GLOVE) ×4 IMPLANT
GLOVE BIOGEL PI IND STRL 7.0 (GLOVE) ×1 IMPLANT
GLOVE BIOGEL PI IND STRL 8 (GLOVE) IMPLANT
GLOVE BIOGEL PI INDICATOR 7.0 (GLOVE) ×1
GLOVE BIOGEL PI INDICATOR 8 (GLOVE) ×1
GOWN STRL REUS W/ TWL XL LVL3 (GOWN DISPOSABLE) ×1 IMPLANT
GOWN STRL REUS W/TWL LRG LVL3 (GOWN DISPOSABLE) ×2 IMPLANT
GOWN STRL REUS W/TWL XL LVL3 (GOWN DISPOSABLE) ×4 IMPLANT
HANDLE SUCTION POOLE (INSTRUMENTS) IMPLANT
HOVERMATT SINGLE USE (MISCELLANEOUS) ×1 IMPLANT
KIT BASIN OR (CUSTOM PROCEDURE TRAY) ×2 IMPLANT
LIGASURE IMPACT 36 18CM CVD LR (INSTRUMENTS) ×1 IMPLANT
MESH VENTRALIGHT ST 8X10 (Mesh General) ×1 IMPLANT
NDL HYPO 25X1 1.5 SAFETY (NEEDLE) IMPLANT
NEEDLE HYPO 25X1 1.5 SAFETY (NEEDLE) IMPLANT
PACK GENERAL/GYN (CUSTOM PROCEDURE TRAY) ×2 IMPLANT
SPONGE LAP 18X18 X RAY DECT (DISPOSABLE) ×2 IMPLANT
STAPLER VISISTAT 35W (STAPLE) ×2 IMPLANT
SUCTION POOLE HANDLE (INSTRUMENTS) ×2
SUT ETHILON 3 0 PS 1 (SUTURE) IMPLANT
SUT NOVA 1 T20/GS 25DT (SUTURE) ×4 IMPLANT
SUT NOVA NAB DX-16 0-1 5-0 T12 (SUTURE) ×1 IMPLANT
SUT PDS AB 1 CTX 36 (SUTURE) ×2 IMPLANT
SUT PROLENE 0 CT 1 CR/8 (SUTURE) IMPLANT
SUT SILK 3 0 (SUTURE)
SUT SILK 3 0 SH CR/8 (SUTURE) ×1 IMPLANT
SUT SILK 3-0 18XBRD TIE 12 (SUTURE) IMPLANT
SUT VIC AB 2-0 CT1 27 (SUTURE) ×1
SUT VIC AB 2-0 CT1 27XBRD (SUTURE) IMPLANT
SUT VIC AB 3-0 SH 27 (SUTURE)
SUT VIC AB 3-0 SH 27XBRD (SUTURE) IMPLANT
SYR CONTROL 10ML LL (SYRINGE) IMPLANT
TOWEL OR 17X26 10 PK STRL BLUE (TOWEL DISPOSABLE) ×2 IMPLANT
TRAY FOL W/BAG SLVR 16FR STRL (SET/KITS/TRAYS/PACK) IMPLANT
TRAY FOLEY W/BAG SLVR 16FR LF (SET/KITS/TRAYS/PACK) ×1

## 2017-07-30 NOTE — Anesthesia Preprocedure Evaluation (Addendum)
Anesthesia Evaluation  Patient identified by MRN, date of birth, ID band Patient awake    Reviewed: Allergy & Precautions, NPO status , Patient's Chart, lab work & pertinent test results  Airway Mallampati: II  TM Distance: >3 FB Neck ROM: Full    Dental  (+) Missing,    Pulmonary neg pulmonary ROS,    Pulmonary exam normal breath sounds clear to auscultation       Cardiovascular hypertension, Pt. on medications + Peripheral Vascular Disease  Normal cardiovascular exam Rhythm:Regular Rate:Normal  Chronic venous insufficiency   Neuro/Psych negative neurological ROS  negative psych ROS   GI/Hepatic Neg liver ROS, GERD  Controlled and Medicated,H/o SBO    Endo/Other  diabetes, Insulin Dependent, Oral Hypoglycemic AgentsMorbid obesity  Renal/GU negative Renal ROS     Musculoskeletal negative musculoskeletal ROS (+)   Abdominal (+) + obese,   Peds  Hematology HLD   Anesthesia Other Findings ventral hernia  Reproductive/Obstetrics                           Anesthesia Physical Anesthesia Plan  ASA: III  Anesthesia Plan: General   Post-op Pain Management:    Induction: Intravenous  PONV Risk Score and Plan: 3 and Dexamethasone, Ondansetron, Treatment may vary due to age or medical condition and Midazolam  Airway Management Planned: Oral ETT  Additional Equipment:   Intra-op Plan:   Post-operative Plan: Extubation in OR  Informed Consent: I have reviewed the patients History and Physical, chart, labs and discussed the procedure including the risks, benefits and alternatives for the proposed anesthesia with the patient or authorized representative who has indicated his/her understanding and acceptance.   Dental advisory given  Plan Discussed with: CRNA  Anesthesia Plan Comments:         Anesthesia Quick Evaluation

## 2017-07-30 NOTE — Transfer of Care (Signed)
Immediate Anesthesia Transfer of Care Note  Patient: Debra [name]  Procedure(s) Performed: Procedure(s): HERNIA REPAIR VENTRAL ADULT (N/A) EXPLORATORY LAPAROTOMY (N/A) EXTENSIVE LYSIS OF ADHESION (N/A) PARTIAL OMENTECTOMY (N/A) INSERTION OF MESH (N/A)  Patient Location: PACU  Anesthesia Type:General  Level of Consciousness: Patient easily awoken, sedated, comfortable, cooperative, following commands, responds to stimulation.   Airway & Oxygen Therapy: Patient spontaneously breathing, ventilating well, oxygen via simple oxygen mask.  Post-op Assessment: Report given to PACU RN, vital signs reviewed and stable, moving all extremities.   Post vital signs: Reviewed and stable.  Complications: No apparent anesthesia complications  Last Vitals:  Vitals Value Taken Time  BP 117/65 07/30/2017  2:52 PM  Temp    Pulse 83 07/30/2017  2:56 PM  Resp 18 07/30/2017  2:56 PM  SpO2 94 % 07/30/2017  2:56 PM  Vitals shown include unvalidated device data.  Last Pain:  Vitals:   07/30/17 0959  TempSrc:   PainSc: 0-No pain      Patients Stated Pain Goal: 2 (17/91/50 5697)  Complications: No apparent anesthesia complications

## 2017-07-30 NOTE — Anesthesia Procedure Notes (Signed)
Procedure Name: Intubation Performed by: Sharlette Dense, CRNA Patient Re-evaluated:Patient Re-evaluated prior to induction Oxygen Delivery Method: Circle system utilized Preoxygenation: Pre-oxygenation with 100% oxygen Induction Type: IV induction Ventilation: Mask ventilation without difficulty and Oral airway inserted - appropriate to patient size Laryngoscope Size: Miller and 2 Grade View: Grade I Tube size: 7.5 mm Number of attempts: 1 Airway Equipment and Method: Stylet Placement Confirmation: ETT inserted through vocal cords under direct vision,  positive ETCO2 and breath sounds checked- equal and bilateral Secured at: 22 cm Tube secured with: Tape Dental Injury: Teeth and Oropharynx as per pre-operative assessment

## 2017-07-30 NOTE — Progress Notes (Signed)
[facility] Progress Note  Day of Surgery  Subjective: CC: nervous for surgery Patient tells me she is nervous but excited for surgery today. No pain or nausea. Tolerated CLD yesterday afternoon. Had a BM yesterday.   Objective: Vital signs in last 24 hours: Temp:  [98.2 F (36.8 C)-99.3 F (37.4 C)] 98.5 F (36.9 C) (05/10 0432) Pulse Rate:  [70-90] 70 (05/10 0432) Resp:  [16-18] 18 (05/10 0432) BP: (129-148)/(57-101) 142/76 (05/10 0432) SpO2:  [99 %-100 %] 99 % (05/10 0432) Last BM Date: 07/25/17  Intake/Output from previous day: 05/09 0701 - 05/10 0700 In: 2343.3 [P.O.:240; I.V.:2003.3; IV Piggyback:100] Out: 1050 [Urine:450; Emesis/NG output:600] Intake/Output this shift: No intake/output data recorded.  PE: Gen:  Alert, NAD, pleasant Card:  Regular rate and rhythm, pedal pulses 2+ BL Pulm:  Normal effort, clear to auscultation bilaterally Abd: Soft, non-tender, non-distended, bowel sounds present, ventral hernia without skin changes or signs of incarceration  Skin: warm and dry, no rashes  Psych: A&Ox3    Lab Results:  Recent Labs    07/29/17 0531 07/30/17 0545  WBC 6.1 5.4  HGB 13.4 12.2  HCT 41.5 36.8  PLT 230 248   BMET Recent Labs    07/29/17 0531 07/30/17 0545  NA 140 140  K 3.6 3.7  CL 108 106  CO2 22 24  GLUCOSE 87 179*  BUN 12 6  CREATININE 0.74 0.67  CALCIUM 7.9* 7.8*   PT/INR No results for input(s): LABPROT, INR in the last 72 hours. CMP     Component Value Date/Time   NA 140 07/30/2017 0545   NA 143 07/08/2015 1023   K 3.7 07/30/2017 0545   CL 106 07/30/2017 0545   CO2 24 07/30/2017 0545   GLUCOSE 179 (H) 07/30/2017 0545   BUN 6 07/30/2017 0545   BUN 21 07/08/2015 1023   CREATININE 0.67 07/30/2017 0545   CREATININE 0.86 05/22/2016 0903   CALCIUM 7.8 (L) 07/30/2017 0545   PROT 6.6 07/28/2017 0612   PROT 6.5 10/03/2013 0920   ALBUMIN 3.1 (L) 07/28/2017 0612   ALBUMIN 4.3 10/03/2013 0920   AST 22 07/28/2017  0612   ALT 28 07/28/2017 0612   ALKPHOS 66 07/28/2017 0612   BILITOT 0.9 07/28/2017 0612   GFRNONAA >60 07/30/2017 0545   GFRNONAA 69 05/22/2016 0903   GFRAA >60 07/30/2017 0545   GFRAA 80 05/22/2016 0903   Lipase     Component Value Date/Time   LIPASE 21 07/27/2017 1039       Studies/Results: Dg Abd Portable 1v  Result Date: 07/29/2017 CLINICAL DATA:  Small-bowel obstruction EXAM: PORTABLE ABDOMEN - 1 VIEW COMPARISON:  07/28/2017 FINDINGS: NG tube within the stomach which is collapsed. Small bowel dilatation in the left lower abdomen measures 4.8 cm in diameter. There is contrast in the right colon. Remote cholecystectomy noted. IMPRESSION: NG tube within the stomach. Residual small bowel dilatation but contrast has progressed into the colon. Electronically Signed   By: Jerilynn Mages.  Shick M.D.   On: 07/29/2017 07:53    Anti-infectives: Anti-infectives (From admission, onward)   Start     Dose/Rate Route Frequency Ordered Stop   07/30/17 0600  ciprofloxacin (CIPRO) IVPB 400 mg     400 mg 200 mL/hr over 60 Minutes Intravenous  Once 07/29/17 1247     07/30/17 0600  metroNIDAZOLE (FLAGYL) IVPB 500 mg     500 mg 100 mL/hr over 60 Minutes Intravenous  Once 07/29/17 1247  Assessment/Plan HTN HLD Hx of diverticulitis T2DM - hypoglycemia better in last 24h Chronic venous insufficiency LLE Cellulitis Morbid obesity  Readmission for SBO likely 2/2 large ventral hernia - Pt was discharged5/5and began having N&V 5/45morning following an episode of diarrhea - very large ventral hernia - to OR today for repair  Hypokalemia - K 3.7, replace to goal of 4.0  FEN:NPO, IVF VTE: SCD's EN:IDPO Foley:none Follow up:TBD  DISPO:OR for hernia repair     LOS: 3 days    Brigid Re , [facility] 07/30/2017, 8:48 AM Pager: [phone] Consults: [phone] Mon-Fri 7:00 am-4:30 pm Sat-Sun 7:00 am-11:30 am

## 2017-07-30 NOTE — Plan of Care (Signed)
Plan of care discussed with patient 

## 2017-07-30 NOTE — Progress Notes (Addendum)
[facility] PROGRESS NOTE    Progress Note  Debra [name]  CZY:[id number] DOB: March 19, 1946 DOA: 07/27/2017 PCP: Vicenta Aly, FNP     Brief Narrative:   Debra [name] is an 72 y.o. female past medical history of diabetes, essential hypertension, morbid obesity recently admitted and discharged on 07/25/2017 for small bowel obstruction with ventral large hernia treated conservatively comes in again with nausea vomiting and no bowel movements for over 24 hours, found to have a small bowel obstruction.  Assessment/Plan:   Recurrent SBO (small bowel obstruction) in the setting of a ventral hernia: NG tube placed, with intermittent suction she is consuming large amounts of Continue IV fluids and electrolyte repletion. Surgery saw her yesterday who recommended surgical intervention. Blood glucose control surgery to take over after surgical intervention.  Type 2 diabetes, uncontrolled, with neuropathy ([place]) Patient is currently n.p.o. discontinue sliding scale insulin did increase NPH to 15 units continue CBGs before meals and at bedtime.  Essential hypertension Continue metoprolol IV.  Blood pressure seems to be stable.  GERD (gastroesophageal reflux disease) Continue Pepcid IV.  Severe obesity (BMI >= 40) (HCC) Counseling.  Hyperlipidemia associated with type 2 diabetes mellitus ([place]) Hold statins, until she is able to tolerate diet   DVT prophylaxis: lovenox  Family Communication:none Disposition Plan/Barrier to D/C: home in 2 days Code Status:     Code Status Orders  (From admission, onward)        Start     Ordered   07/27/17 1247  Full code  Continuous     07/27/17 1247    Code Status History    Date Active Date Inactive Code Status Order ID Comments User Context   07/22/2017 1738 07/25/2017 1355 Full Code [id number]  Cristal Ford, DO ED   04/14/2013 1451 04/19/2013 1346 Full Code [id number]  Fanny Skates, MD Inpatient   04/13/2013 1054 04/14/2013 1451 Full  Code [id number]  Earnstine Regal, PA-C ED        IV Access:    Peripheral IV   Procedures and diagnostic studies:   Dg Abd Portable 1v  Result Date: 07/29/2017 CLINICAL DATA:  Small-bowel obstruction EXAM: PORTABLE ABDOMEN - 1 VIEW COMPARISON:  07/28/2017 FINDINGS: NG tube within the stomach which is collapsed. Small bowel dilatation in the left lower abdomen measures 4.8 cm in diameter. There is contrast in the right colon. Remote cholecystectomy noted. IMPRESSION: NG tube within the stomach. Residual small bowel dilatation but contrast has progressed into the colon. Electronically Signed   By: Jerilynn Mages.  Shick M.D.   On: 07/29/2017 07:53     Medical Consultants:    None.  Anti-Infectives:   None  Subjective:    Debra [name] NG tube well no new concerns or complaints.  Objective:    Vitals:   07/29/17 1800 07/29/17 2231 07/30/17 0034 07/30/17 0432  BP: (!) 129/57 (!) 148/69 (!) 143/62 (!) 142/76  Pulse: 84 77 79 70  Resp:  18 18 18   Temp:  99.3 F (37.4 C)  98.5 F (36.9 C)  TempSrc:  Oral  Oral  SpO2: 100% 99%  99%  Weight:      Height:        Intake/Output Summary (Last 24 hours) at 07/30/2017 0854 Last data filed at 07/30/2017 0600 Gross per 24 hour  Intake 2343.33 ml  Output 1050 ml  Net 1293.33 ml   Filed Weights   07/27/17 1242  Weight: 120.7 kg (266 lb)    Exam: General exam: In no  acute distress. Respiratory system: Good air movement and clear to auscultation. Cardiovascular system: S1 & S2 heard, RRR.   Gastrointestinal system: Positive bowel sounds soft nontender nondistended. Central nervous system: Alert and oriented. No focal neurological deficits. Extremities: No pedal edema. Skin: No rashes, lesions or ulcers Psychiatry: Judgement and insight appear normal. Mood & affect appropriate.    Data Reviewed:    Labs: Basic Metabolic Panel: Recent Labs  Lab 07/25/17 0420 07/27/17 1039 07/28/17 0612 07/29/17 0531 07/30/17 0545  NA  140 138 141 140 140  K 3.8 4.3 3.4* 3.6 3.7  CL 109 104 105 108 106  CO2 23 22 26 22 24   GLUCOSE 218* 296* 158* 87 179*  BUN 9 15 16 12 6   CREATININE 0.62 0.88 0.95 0.74 0.67  CALCIUM 7.9* 9.0 8.1* 7.9* 7.8*  MG  --  1.3*  --  1.6*  --    GFR Estimated Creatinine Clearance: 83.8 mL/min (by C-G formula based on SCr of 0.67 mg/dL). Liver Function Tests: Recent Labs  Lab 07/27/17 1039 07/28/17 0612  AST 37 22  ALT 34 28  ALKPHOS 83 66  BILITOT 1.4* 0.9  PROT 7.8 6.6  ALBUMIN 3.8 3.1*   Recent Labs  Lab 07/27/17 1039  LIPASE 21   No results for input(s): AMMONIA in the last 168 hours. Coagulation profile No results for input(s): INR, PROTIME in the last 168 hours.  CBC: Recent Labs  Lab 07/27/17 1039 07/28/17 0612 07/29/17 0531 07/30/17 0545  WBC 8.2 7.7 6.1 5.4  NEUTROABS 5.6  --   --   --   HGB 15.1* 13.1 13.4 12.2  HCT 44.3 40.5 41.5 36.8  MCV 90.8 92.5 92.8 91.5  PLT 270 232 230 248   Cardiac Enzymes: No results for input(s): CKTOTAL, CKMB, CKMBINDEX, TROPONINI in the last 168 hours. BNP (last 3 results) No results for input(s): PROBNP in the last 8760 hours. CBG: Recent Labs  Lab 07/29/17 2008 07/29/17 2236 07/29/17 2310 07/30/17 0430 07/30/17 0806  GLUCAP 175* 154* 166* 166* 142*   D-Dimer: No results for input(s): DDIMER in the last 72 hours. Hgb A1c: Recent Labs    07/27/17 1039  HGBA1C 8.9*   Lipid Profile: No results for input(s): CHOL, HDL, LDLCALC, TRIG, CHOLHDL, LDLDIRECT in the last 72 hours. Thyroid function studies: No results for input(s): TSH, T4TOTAL, T3FREE, THYROIDAB in the last 72 hours.  Invalid input(s): FREET3 Anemia work up: No results for input(s): VITAMINB12, FOLATE, FERRITIN, TIBC, IRON, RETICCTPCT in the last 72 hours. Sepsis Labs: Recent Labs  Lab 07/27/17 1039 07/28/17 0612 07/29/17 0531 07/30/17 0545  WBC 8.2 7.7 6.1 5.4   Microbiology No results found for this or any previous visit (from the past 240  hour(s)).   Medications:   . insulin aspart  0-9 Units Subcutaneous Q4H  . insulin NPH Human  20 Units Subcutaneous BID AC & HS  . metoprolol tartrate  5 mg Intravenous Q6H   Continuous Infusions: . ciprofloxacin    . dextrose 5 % and 0.45 % NaCl with KCl 20 mEq/L 100 mL/hr at 07/30/17 0216  . famotidine (PEPCID) IV Stopped (07/29/17 1942)  . metronidazole        LOS: 3 days   [facility] Pager [phone]  *Please refer to [email or web address], password [email or web address] to get updated schedule on who will round on this patient, as hospitalists switch teams weekly. If 7PM-7AM, please contact night-coverage at [email or web address], password [email or web address] for any overnight needs.  07/30/2017, 8:54 AM

## 2017-07-30 NOTE — H&P (View-Only) (Signed)
[facility] Progress Note  Day of Surgery  Subjective: CC: nervous for surgery Patient tells me she is nervous but excited for surgery today. No pain or nausea. Tolerated CLD yesterday afternoon. Had a BM yesterday.   Objective: Vital signs in last 24 hours: Temp:  [98.2 F (36.8 C)-99.3 F (37.4 C)] 98.5 F (36.9 C) (05/10 0432) Pulse Rate:  [70-90] 70 (05/10 0432) Resp:  [16-18] 18 (05/10 0432) BP: (129-148)/(57-101) 142/76 (05/10 0432) SpO2:  [99 %-100 %] 99 % (05/10 0432) Last BM Date: 07/25/17  Intake/Output from previous day: 05/09 0701 - 05/10 0700 In: 2343.3 [P.O.:240; I.V.:2003.3; IV Piggyback:100] Out: 1050 [Urine:450; Emesis/NG output:600] Intake/Output this shift: No intake/output data recorded.  PE: Gen:  Alert, NAD, pleasant Card:  Regular rate and rhythm, pedal pulses 2+ BL Pulm:  Normal effort, clear to auscultation bilaterally Abd: Soft, non-tender, non-distended, bowel sounds present, ventral hernia without skin changes or signs of incarceration  Skin: warm and dry, no rashes  Psych: A&Ox3    Lab Results:  Recent Labs    07/29/17 0531 07/30/17 0545  WBC 6.1 5.4  HGB 13.4 12.2  HCT 41.5 36.8  PLT 230 248   BMET Recent Labs    07/29/17 0531 07/30/17 0545  NA 140 140  K 3.6 3.7  CL 108 106  CO2 22 24  GLUCOSE 87 179*  BUN 12 6  CREATININE 0.74 0.67  CALCIUM 7.9* 7.8*   PT/INR No results for input(s): LABPROT, INR in the last 72 hours. CMP     Component Value Date/Time   NA 140 07/30/2017 0545   NA 143 07/08/2015 1023   K 3.7 07/30/2017 0545   CL 106 07/30/2017 0545   CO2 24 07/30/2017 0545   GLUCOSE 179 (H) 07/30/2017 0545   BUN 6 07/30/2017 0545   BUN 21 07/08/2015 1023   CREATININE 0.67 07/30/2017 0545   CREATININE 0.86 05/22/2016 0903   CALCIUM 7.8 (L) 07/30/2017 0545   PROT 6.6 07/28/2017 0612   PROT 6.5 10/03/2013 0920   ALBUMIN 3.1 (L) 07/28/2017 0612   ALBUMIN 4.3 10/03/2013 0920   AST 22 07/28/2017  0612   ALT 28 07/28/2017 0612   ALKPHOS 66 07/28/2017 0612   BILITOT 0.9 07/28/2017 0612   GFRNONAA >60 07/30/2017 0545   GFRNONAA 69 05/22/2016 0903   GFRAA >60 07/30/2017 0545   GFRAA 80 05/22/2016 0903   Lipase     Component Value Date/Time   LIPASE 21 07/27/2017 1039       Studies/Results: Dg Abd Portable 1v  Result Date: 07/29/2017 CLINICAL DATA:  Small-bowel obstruction EXAM: PORTABLE ABDOMEN - 1 VIEW COMPARISON:  07/28/2017 FINDINGS: NG tube within the stomach which is collapsed. Small bowel dilatation in the left lower abdomen measures 4.8 cm in diameter. There is contrast in the right colon. Remote cholecystectomy noted. IMPRESSION: NG tube within the stomach. Residual small bowel dilatation but contrast has progressed into the colon. Electronically Signed   By: Jerilynn Mages.  Shick M.D.   On: 07/29/2017 07:53    Anti-infectives: Anti-infectives (From admission, onward)   Start     Dose/Rate Route Frequency Ordered Stop   07/30/17 0600  ciprofloxacin (CIPRO) IVPB 400 mg     400 mg 200 mL/hr over 60 Minutes Intravenous  Once 07/29/17 1247     07/30/17 0600  metroNIDAZOLE (FLAGYL) IVPB 500 mg     500 mg 100 mL/hr over 60 Minutes Intravenous  Once 07/29/17 1247  Assessment/Plan HTN HLD Hx of diverticulitis T2DM - hypoglycemia better in last 24h Chronic venous insufficiency LLE Cellulitis Morbid obesity  Readmission for SBO likely 2/2 large ventral hernia - Pt was discharged5/5and began having N&V 5/23morning following an episode of diarrhea - very large ventral hernia - to OR today for repair  Hypokalemia - K 3.7, replace to goal of 4.0  FEN:NPO, IVF VTE: SCD's JK:QASU Foley:none Follow up:TBD  DISPO:OR for hernia repair     LOS: 3 days    Brigid Re , [facility] 07/30/2017, 8:48 AM Pager: [phone] Consults: [phone] Mon-Fri 7:00 am-4:30 pm Sat-Sun 7:00 am-11:30 am

## 2017-07-30 NOTE — Interval H&P Note (Signed)
History and Physical Interval Note:  07/30/2017 9:47 AM  Debra [name]  has presented today for surgery, with the diagnosis of ventral hernia  The various methods of treatment have been discussed with the patient and family. After consideration of risks, benefits and other options for treatment, the patient has consented to  Procedure(s): HERNIA REPAIR VENTRAL ADULT (N/A) as a surgical intervention .  The patient's history has been reviewed, patient examined, no change in status, stable for surgery.  I have reviewed the patient's chart and labs.  Questions were answered to the patient's satisfaction.     TOTH III,Amberlie Gaillard S

## 2017-07-30 NOTE — Op Note (Signed)
07/30/2017  2:37 PM  PATIENT:  Debra [name]  72 y.o. female  PRE-OPERATIVE DIAGNOSIS:  ventral hernia  POST-OPERATIVE DIAGNOSIS:  ventral hernia  PROCEDURE:  Procedure(s): HERNIA REPAIR VENTRAL ADULT WITH MESH EXPLORATORY LAPAROTOMY (N/A) EXTENSIVE LYSIS OF ADHESION (N/A) PARTIAL OMENTECTOMY (N/A)  SURGEON:  Surgeon(s) and Role:    * Jovita Kussmaul, MD - Primary    * Armandina Gemma, MD - Assisting  PHYSICIAN ASSISTANT:   ASSISTANTS: Dr. Harlow Asa   ANESTHESIA:   general  EBL:  100 mL   BLOOD ADMINISTERED:none  DRAINS: (1) Jackson-Pratt drain(s) with closed bulb suction in the subcutaneous space   LOCAL MEDICATIONS USED:  NONE  SPECIMEN:  Source of Specimen:  hernia sac  DISPOSITION OF SPECIMEN:  PATHOLOGY  COUNTS:  YES  TOURNIQUET:  * No tourniquets in log *  DICTATION: .Dragon Dictation   After informed consent was obtained the patient was brought to the operating room and placed in the supine position on the operating table.  After adequate induction of general anesthesia the patient's abdomen was prepped with ChloraPrep, allowed to dry, and draped in usual sterile manner.  An appropriate timeout was performed.  A midline incision was made with a 10 blade knife.  The incision was carried through the skin and subcutaneous tissue sharply with electrocautery until the hernia was identified.  The hernia sac was opened sharply with the electrocautery.  There were many loops of colon and small bowel that were both adherent to the hernia sac and adherent to each other.  We spent at least a couple hours lysing adhesions and freeing the small bowel and colon so that we could feel good that we had relieve the obstruction.  As part of our dissection to remove the hernia sac we also had to resect some omentum sharply with the LigaSure.  Once this was accomplished we were able to then reduce the small bowel and colon back into the abdominal cavity.  The fascial edges appeared relatively  healthy.  We then dissected on top of the fascia in the subcutaneous plane to give more mobilization to the fascia so that it could be closed.  I then chose a 20 x 25 cm piece of ventral light mesh.  The mesh was sewed using #1 Novafil use stitches beneath the fascia in the abdominal cavity.  These stitches were placed circumferentially around the fascial defect so that there were no gaps or redundancy.  The stitches were placed about 5 cm back from the fascial edge circumferentially.  Once this was accomplished the mesh seemed to be in good position.  The edges of the mesh were checked and there did not appear to be anything caught between the stitches.  The wound was then irrigated with copious amounts of saline.  The fascia was then closed over the mesh with figure-of-eight #1 Novafil stitches.  A small stab incision was then made in the left lower quadrant.  A tonsil clamp was placed through this opening and used to bring a 19 [place] round Blake drain into the operative wound.  The drain was anchored to the skin with a #1 Novafil stitch.  The subcutaneous tissue was then closed over the drain with a running 2-0 Vicryl stitch.  The skin was closed with staples.  Sterile dressings were applied.  The drain was placed to bulb suction and there was a good seal.  The patient tolerated the procedure well.  At the end of the case all needle sponge and  instrument counts were correct.  The patient was then awakened and taken to recovery in stable condition.  PLAN OF CARE: Admit to inpatient   PATIENT DISPOSITION:  PACU - hemodynamically stable.   Delay start of Pharmacological VTE agent (>24hrs) due to surgical blood loss or risk of bleeding: no

## 2017-07-31 LAB — GLUCOSE, CAPILLARY
GLUCOSE-CAPILLARY: 179 mg/dL — AB (ref 65–99)
GLUCOSE-CAPILLARY: 167 mg/dL — AB (ref 65–99)
GLUCOSE-CAPILLARY: 117 mg/dL — AB (ref 65–99)
Glucose-Capillary: 136 mg/dL — ABNORMAL HIGH (ref 65–99)
Glucose-Capillary: 226 mg/dL — ABNORMAL HIGH (ref 65–99)
Glucose-Capillary: 229 mg/dL — ABNORMAL HIGH (ref 65–99)

## 2017-07-31 MED ORDER — INSULIN NPH (HUMAN) (ISOPHANE) 100 UNIT/ML ~~LOC~~ SUSP
17.0000 [IU] | Freq: Two times a day (BID) | SUBCUTANEOUS | Status: DC
  Administered 2017-07-31 – 2017-08-03 (×7): 17 [IU] via SUBCUTANEOUS
  Filled 2017-07-31: qty 10

## 2017-07-31 MED ORDER — LIP MEDEX EX OINT
TOPICAL_OINTMENT | CUTANEOUS | Status: AC
  Filled 2017-07-31: qty 7

## 2017-07-31 MED ORDER — INSULIN ASPART 100 UNIT/ML ~~LOC~~ SOLN
0.0000 [IU] | SUBCUTANEOUS | Status: DC
  Administered 2017-07-31: 5 [IU] via SUBCUTANEOUS
  Administered 2017-07-31: 2 [IU] via SUBCUTANEOUS
  Administered 2017-07-31 – 2017-08-01 (×2): 3 [IU] via SUBCUTANEOUS
  Administered 2017-08-01 – 2017-08-02 (×6): 2 [IU] via SUBCUTANEOUS
  Administered 2017-08-02: 5 [IU] via SUBCUTANEOUS
  Administered 2017-08-02 (×5): 3 [IU] via SUBCUTANEOUS
  Administered 2017-08-03: 5 [IU] via SUBCUTANEOUS
  Administered 2017-08-03 (×4): 3 [IU] via SUBCUTANEOUS
  Administered 2017-08-04: 8 [IU] via SUBCUTANEOUS
  Administered 2017-08-04 (×3): 3 [IU] via SUBCUTANEOUS
  Administered 2017-08-04 – 2017-08-05 (×2): 2 [IU] via SUBCUTANEOUS

## 2017-07-31 NOTE — Progress Notes (Signed)
[facility] PROGRESS NOTE    Progress Note  Debra [name]  FMB:[id number] DOB: November 24, 1945 DOA: 07/27/2017 PCP: Vicenta Aly, FNP     Brief Narrative:   Debra [name] is an 72 y.o. female past medical history of diabetes, essential hypertension, morbid obesity recently admitted and discharged on 07/25/2017 for small bowel obstruction with ventral large hernia treated conservatively comes in again with nausea vomiting and no bowel movements for over 24 hours, found to have a small bowel obstruction.  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 Continue to monitor and replete electrolytes closely. Further management per surgery.  Type 2 diabetes, uncontrolled, with neuropathy ([place]) Patient is currently n.p.o. blood glucose running high she is on D5 half-normal drip, add sliding scale insulin continue NPH.  Essential hypertension Blood pressure seems to be well controlled continue IV metoprolol as needed.  GERD (gastroesophageal reflux disease) Continue Pepcid IV.  Severe obesity (BMI >= 40) (HCC) Counseling.  Hyperlipidemia associated with type 2 diabetes mellitus ([place]) Hold statins, until she is able to tolerate diet   DVT prophylaxis: lovenox  Family Communication:none Disposition Plan/Barrier to D/C: home in 3-4 days Code Status:     Code Status Orders  (From admission, onward)        Start     Ordered   07/27/17 1247  Full code  Continuous     07/27/17 1247    Code Status History    Date Active Date Inactive Code Status Order ID Comments User Context   07/22/2017 1738 07/25/2017 1355 Full Code [id number]  Cristal Ford, DO ED   04/14/2013 1451 04/19/2013 1346 Full Code [id number]  Fanny Skates, MD Inpatient   04/13/2013 1054 04/14/2013 1451 Full Code [id number]  Earnstine Regal, PA-C ED        IV Access:    Peripheral  IV   Procedures and diagnostic studies:   No results found.   Medical Consultants:    None.  Anti-Infectives:   None  Subjective:    Debra [name] she relates she continues to have abdominal pain but the pain medication seems to be controlling the abdominal pain.  Objective:    Vitals:   07/30/17 1925 07/30/17 1958 07/30/17 2311 07/31/17 0436  BP: (!) 154/84 (!) 146/77 (!) 148/79 (!) 144/83  Pulse: 81 82 88 90  Resp:  18 16 16   Temp: 97.9 F (36.6 C) 97.8 F (36.6 C) 98 F (36.7 C) 98.5 F (36.9 C)  TempSrc: Oral Oral Oral Oral  SpO2: 95% 96% 97% 94%  Weight:      Height:        Intake/Output Summary (Last 24 hours) at 07/31/2017 0825 Last data filed at 07/31/2017 0600 Gross per 24 hour  Intake 4750 ml  Output 2350 ml  Net 2400 ml   Filed Weights   07/27/17 1242  Weight: 120.7 kg (266 lb)    Exam: General exam: In no acute distress, NG tube in place Respiratory system: Good air movement and clear to auscultation. Cardiovascular system: S1 & S2 heard, RRR.   Gastrointestinal system: Deferred due to recent surgical intervention. Central nervous system: Alert and oriented. No focal neurological deficits. Extremities: No pedal edema. Skin: No rashes, lesions or ulcers Psychiatry: Judgement and insight appear normal. Mood & affect appropriate.    Data Reviewed:    Labs: Basic Metabolic Panel: Recent Labs  Lab 07/25/17  7628 07/27/17 1039 07/28/17 0612 07/29/17 0531 07/30/17 0545  NA 140 138 141 140 140  K 3.8 4.3 3.4* 3.6 3.7  CL 109 104 105 108 106  CO2 23 22 26 22 24   GLUCOSE 218* 296* 158* 87 179*  BUN 9 15 16 12 6   CREATININE 0.62 0.88 0.95 0.74 0.67  CALCIUM 7.9* 9.0 8.1* 7.9* 7.8*  MG  --  1.3*  --  1.6*  --    GFR Estimated Creatinine Clearance: 83.8 mL/min (by C-G formula based on SCr of 0.67 mg/dL). Liver Function Tests: Recent Labs  Lab 07/27/17 1039 07/28/17 0612  AST 37 22  ALT 34 28  ALKPHOS 83 66  BILITOT 1.4* 0.9   PROT 7.8 6.6  ALBUMIN 3.8 3.1*   Recent Labs  Lab 07/27/17 1039  LIPASE 21   No results for input(s): AMMONIA in the last 168 hours. Coagulation profile No results for input(s): INR, PROTIME in the last 168 hours.  CBC: Recent Labs  Lab 07/27/17 1039 07/28/17 0612 07/29/17 0531 07/30/17 0545  WBC 8.2 7.7 6.1 5.4  NEUTROABS 5.6  --   --   --   HGB 15.1* 13.1 13.4 12.2  HCT 44.3 40.5 41.5 36.8  MCV 90.8 92.5 92.8 91.5  PLT 270 232 230 248   Cardiac Enzymes: No results for input(s): CKTOTAL, CKMB, CKMBINDEX, TROPONINI in the last 168 hours. BNP (last 3 results) No results for input(s): PROBNP in the last 8760 hours. CBG: Recent Labs  Lab 07/30/17 1509 07/30/17 2000 07/30/17 2313 07/31/17 0432 07/31/17 0742  GLUCAP 158* 208* 249* 229* 226*   D-Dimer: No results for input(s): DDIMER in the last 72 hours. Hgb A1c: No results for input(s): HGBA1C in the last 72 hours. Lipid Profile: No results for input(s): CHOL, HDL, LDLCALC, TRIG, CHOLHDL, LDLDIRECT in the last 72 hours. Thyroid function studies: No results for input(s): TSH, T4TOTAL, T3FREE, THYROIDAB in the last 72 hours.  Invalid input(s): FREET3 Anemia work up: No results for input(s): VITAMINB12, FOLATE, FERRITIN, TIBC, IRON, RETICCTPCT in the last 72 hours. Sepsis Labs: Recent Labs  Lab 07/27/17 1039 07/28/17 0612 07/29/17 0531 07/30/17 0545  WBC 8.2 7.7 6.1 5.4   Microbiology Recent Results (from the past 240 hour(s))  Surgical pcr screen     Status: None   Collection Time: 07/30/17  5:33 AM  Result Value Ref Range Status   MRSA, PCR NEGATIVE NEGATIVE Final   Staphylococcus aureus NEGATIVE NEGATIVE Final    Comment: (NOTE) The Xpert SA Assay (FDA approved for NASAL specimens in patients 35 years of age and older), is one component of a comprehensive surveillance program. It is not intended to diagnose infection nor to guide or monitor treatment. Performed at [facility],  [place] [address] [place], [place] [address]      Medications:   . insulin aspart  0-15 Units Subcutaneous Q4H  . insulin NPH Human  17 Units Subcutaneous BID AC & HS  . lip balm      . metoprolol tartrate  5 mg Intravenous Q6H   Continuous Infusions: . dextrose 5 % and 0.45 % NaCl with KCl 20 mEq/L 100 mL/hr at 07/31/17 0430  . famotidine (PEPCID) IV Stopped (07/30/17 2237)      LOS: 4 days   Charlynne Cousins  [facility] Pager [phone]  *Please refer to [email or web address], password [email or web address] to get updated schedule on who will round on this patient, as hospitalists switch teams weekly. If 7PM-7AM, please  contact night-coverage at [email or web address], password [email or web address] for any overnight needs.  07/31/2017, 8:25 AM

## 2017-07-31 NOTE — Progress Notes (Signed)
1 Day Post-Op   Subjective/Chief Complaint: Complains of back pain   Objective: Vital signs in last 24 hours: Temp:  [97.5 F (36.4 C)-98.5 F (36.9 C)] 98.5 F (36.9 C) (05/11 0436) Pulse Rate:  [74-90] 90 (05/11 0436) Resp:  [14-23] 16 (05/11 0436) BP: (112-156)/(61-84) 144/83 (05/11 0436) SpO2:  [92 %-97 %] 94 % (05/11 0436) Last BM Date: 07/25/17  Intake/Output from previous day: 05/10 0701 - 05/11 0700 In: 4750 [I.V.:4700; IV Piggyback:50] Out: 2350 [Urine:1525; Emesis/NG output:300; Drains:125; Blood:100] Intake/Output this shift: No intake/output data recorded.  General appearance: alert and cooperative Resp: clear to auscultation bilaterally Cardio: regular rate and rhythm GI: soft, appropriately tender. incision ok  Lab Results:  Recent Labs    07/29/17 0531 07/30/17 0545  WBC 6.1 5.4  HGB 13.4 12.2  HCT 41.5 36.8  PLT 230 248   BMET Recent Labs    07/29/17 0531 07/30/17 0545  NA 140 140  K 3.6 3.7  CL 108 106  CO2 22 24  GLUCOSE 87 179*  BUN 12 6  CREATININE 0.74 0.67  CALCIUM 7.9* 7.8*   PT/INR No results for input(s): LABPROT, INR in the last 72 hours. ABG No results for input(s): PHART, HCO3 in the last 72 hours.  Invalid input(s): PCO2, PO2  Studies/Results: No results found.  Anti-infectives: Anti-infectives (From admission, onward)   Start     Dose/Rate Route Frequency Ordered Stop   07/30/17 0600  ciprofloxacin (CIPRO) IVPB 400 mg     400 mg 200 mL/hr over 60 Minutes Intravenous  Once 07/29/17 1247 07/30/17 1242   07/30/17 0600  metroNIDAZOLE (FLAGYL) IVPB 500 mg     500 mg 100 mL/hr over 60 Minutes Intravenous  Once 07/29/17 1247 07/30/17 1240      Assessment/Plan: s/p Procedure(s): HERNIA REPAIR VENTRAL ADULT (N/A) EXPLORATORY LAPAROTOMY (N/A) EXTENSIVE LYSIS OF ADHESION (N/A) PARTIAL OMENTECTOMY (N/A) INSERTION OF MESH (N/A) continue ng and bowel rest for now  Ambulate Pod 1  LOS: 4 days    TOTH III,PAUL  S 07/31/2017

## 2017-08-01 ENCOUNTER — Inpatient Hospital Stay (HOSPITAL_COMMUNITY): Payer: Medicare HMO

## 2017-08-01 LAB — BASIC METABOLIC PANEL
ANION GAP: 11 (ref 5–15)
BUN: 10 mg/dL (ref 6–20)
CO2: 26 mmol/L (ref 22–32)
Calcium: 8.1 mg/dL — ABNORMAL LOW (ref 8.9–10.3)
Chloride: 99 mmol/L — ABNORMAL LOW (ref 101–111)
Creatinine, Ser: 1.08 mg/dL — ABNORMAL HIGH (ref 0.44–1.00)
GFR calc Af Amer: 59 mL/min — ABNORMAL LOW (ref >60)
GFR, EST NON AFRICAN AMERICAN: 51 mL/min — AB (ref >60)
GLUCOSE: 185 mg/dL — AB (ref 65–99)
Potassium: 4.2 mmol/L (ref 3.5–5.1)
Sodium: 136 mmol/L (ref 135–145)

## 2017-08-01 LAB — CBC WITH DIFFERENTIAL/PLATELET
BASOS ABS: 0 10*3/uL (ref 0.0–0.1)
BASOS ABS: 0 10*3/uL (ref 0.0–0.1)
BASOS PCT: 0 %
Basophils Relative: 0 %
Eosinophils Absolute: 0.1 10*3/uL (ref 0.0–0.7)
Eosinophils Absolute: 0 10*3/uL (ref 0.0–0.7)
Eosinophils Relative: 1 %
Eosinophils Relative: 0 %
HCT: 40.5 % (ref 36.0–46.0)
HCT: 39.7 % (ref 36.0–46.0)
HEMOGLOBIN: 13.3 g/dL (ref 12.0–15.0)
Hemoglobin: 13.4 g/dL (ref 12.0–15.0)
LYMPHS ABS: 1.4 10*3/uL (ref 0.7–4.0)
LYMPHS PCT: 8 %
Lymphocytes Relative: 9 %
Lymphs Abs: 1.8 10*3/uL (ref 0.7–4.0)
MCH: 30.2 pg (ref 26.0–34.0)
MCH: 30.4 pg (ref 26.0–34.0)
MCHC: 33.1 g/dL (ref 30.0–36.0)
MCHC: 33.5 g/dL (ref 30.0–36.0)
MCV: 91.4 fL (ref 78.0–100.0)
MCV: 90.8 fL (ref 78.0–100.0)
MONO ABS: 1.9 10*3/uL — AB (ref 0.1–1.0)
MONOS PCT: 10 %
Monocytes Absolute: 1.7 10*3/uL — ABNORMAL HIGH (ref 0.1–1.0)
Monocytes Relative: 10 %
NEUTROS ABS: 15.2 10*3/uL — AB (ref 1.7–7.7)
NEUTROS ABS: 14.4 10*3/uL — AB (ref 1.7–7.7)
NEUTROS PCT: 82 %
Neutrophils Relative %: 80 %
PLATELETS: 320 10*3/uL (ref 150–400)
Platelets: 291 10*3/uL (ref 150–400)
RBC: 4.43 MIL/uL (ref 3.87–5.11)
RBC: 4.37 MIL/uL (ref 3.87–5.11)
RDW: 14 % (ref 11.5–15.5)
RDW: 14 % (ref 11.5–15.5)
WBC: 19 10*3/uL — ABNORMAL HIGH (ref 4.0–10.5)
WBC: 17.5 10*3/uL — ABNORMAL HIGH (ref 4.0–10.5)

## 2017-08-01 LAB — GLUCOSE, CAPILLARY
GLUCOSE-CAPILLARY: 138 mg/dL — AB (ref 65–99)
GLUCOSE-CAPILLARY: 149 mg/dL — AB (ref 65–99)
GLUCOSE-CAPILLARY: 170 mg/dL — AB (ref 65–99)
Glucose-Capillary: 128 mg/dL — ABNORMAL HIGH (ref 65–99)
Glucose-Capillary: 129 mg/dL — ABNORMAL HIGH (ref 65–99)
Glucose-Capillary: 140 mg/dL — ABNORMAL HIGH (ref 65–99)
Glucose-Capillary: 182 mg/dL — ABNORMAL HIGH (ref 65–99)

## 2017-08-01 LAB — TROPONIN I: Troponin I: 0.04 ng/mL (ref <0.03)

## 2017-08-01 MED ORDER — METHYLPREDNISOLONE SODIUM SUCC 40 MG IJ SOLR
40.0000 mg | Freq: Two times a day (BID) | INTRAMUSCULAR | Status: DC
  Administered 2017-08-01 – 2017-08-02 (×2): 40 mg via INTRAVENOUS
  Filled 2017-08-01 (×2): qty 1

## 2017-08-01 MED ORDER — VANCOMYCIN HCL 10 G IV SOLR
2000.0000 mg | Freq: Once | INTRAVENOUS | Status: AC
Start: 2017-08-01 — End: 2017-08-01
  Administered 2017-08-01: 2000 mg via INTRAVENOUS
  Filled 2017-08-01: qty 2000

## 2017-08-01 MED ORDER — DEXTROSE-NACL 5-0.45 % IV SOLN
INTRAVENOUS | Status: DC
  Administered 2017-08-01 – 2017-08-04 (×5): via INTRAVENOUS

## 2017-08-01 MED ORDER — DEXTROSE-NACL 5-0.45 % IV SOLN
INTRAVENOUS | Status: DC
  Filled 2017-08-01: qty 1000

## 2017-08-01 MED ORDER — HALOPERIDOL LACTATE 5 MG/ML IJ SOLN
2.0000 mg | Freq: Four times a day (QID) | INTRAMUSCULAR | Status: DC | PRN
  Administered 2017-08-01 – 2017-08-02 (×2): 2 mg via INTRAVENOUS
  Filled 2017-08-01 (×2): qty 1

## 2017-08-01 MED ORDER — MEROPENEM 1 G IV SOLR
1.0000 g | Freq: Three times a day (TID) | INTRAVENOUS | Status: DC
Start: 2017-08-01 — End: 2017-08-03
  Administered 2017-08-01 – 2017-08-03 (×6): 1 g via INTRAVENOUS
  Filled 2017-08-01 (×6): qty 1

## 2017-08-01 MED ORDER — IOPAMIDOL (ISOVUE-300) INJECTION 61%
INTRAVENOUS | Status: AC
  Filled 2017-08-01: qty 100

## 2017-08-01 MED ORDER — IOPAMIDOL (ISOVUE-300) INJECTION 61%
100.0000 mL | Freq: Once | INTRAVENOUS | Status: AC | PRN
  Administered 2017-08-01: 100 mL via INTRAVENOUS

## 2017-08-01 MED ORDER — METOPROLOL TARTRATE 5 MG/5ML IV SOLN: 5.0000 mg | INTRAVENOUS | Status: DC | PRN

## 2017-08-01 MED ORDER — METOPROLOL TARTRATE 5 MG/5ML IV SOLN: 10.0000 mg | INTRAVENOUS | Status: DC | PRN

## 2017-08-01 MED ORDER — VANCOMYCIN HCL 10 G IV SOLR
1250.0000 mg | INTRAVENOUS | Status: DC
  Administered 2017-08-02: 1250 mg via INTRAVENOUS
  Filled 2017-08-01: qty 1250

## 2017-08-01 MED ORDER — METOPROLOL TARTRATE 5 MG/5ML IV SOLN
10.0000 mg | INTRAVENOUS | Status: DC | PRN
  Administered 2017-08-01: 10 mg via INTRAVENOUS
  Filled 2017-08-01: qty 10

## 2017-08-01 NOTE — Anesthesia Postprocedure Evaluation (Signed)
Anesthesia Post Note  Patient: Debra [name]  Procedure(s) Performed: HERNIA REPAIR VENTRAL ADULT (N/A Abdomen) EXPLORATORY LAPAROTOMY (N/A Abdomen) EXTENSIVE LYSIS OF ADHESION (N/A Abdomen) PARTIAL OMENTECTOMY (N/A Abdomen) INSERTION OF MESH (N/A Abdomen)     Patient location during evaluation: PACU Anesthesia Type: General Level of consciousness: awake and alert Pain management: pain level controlled Vital Signs Assessment: post-procedure vital signs reviewed and stable Respiratory status: spontaneous breathing, nonlabored ventilation, respiratory function stable and patient connected to nasal cannula oxygen Cardiovascular status: blood pressure returned to baseline and stable Postop Assessment: no apparent nausea or vomiting Anesthetic complications: no    Last Vitals:  Vitals:   08/01/17 1827 08/01/17 1953  BP: (!) 157/71 (!) 104/56  Pulse: (!) 121 (!) 116  Resp: (!) 28 (!) 22  Temp: 37.8 C 37.2 C  SpO2: 92% 95%    Last Pain:  Vitals:   08/01/17 2122  TempSrc:   PainSc: 9                  Churchill Grimsley P Niley Helbig

## 2017-08-01 NOTE — Progress Notes (Signed)
2 Days Post-Op   Subjective/Chief Complaint: Complains of soreness   Objective: Vital signs in last 24 hours: Temp:  [98.2 F (36.8 C)-99.2 F (37.3 C)] 98.4 F (36.9 C) (05/12 0519) Pulse Rate:  [85-104] 98 (05/12 0519) Resp:  [15-18] 16 (05/12 0519) BP: (149-187)/(78-91) 150/89 (05/12 0519) SpO2:  [90 %-98 %] 98 % (05/12 0519) Last BM Date: 07/25/17  Intake/Output from previous day: 05/11 0701 - 05/12 0700 In: 2400 [I.V.:2300; IV Piggyback:100] Out: 1970 [Urine:1900; Drains:70] Intake/Output this shift: Total I/O In: 1200 [I.V.:1150; IV Piggyback:50] Out: 1100 [Urine:1100]  General appearance: alert and cooperative Resp: clear to auscultation bilaterally Cardio: regular rate and rhythm GI: soft with moderate tenderness. incision ok. she says she is passing flatus but abd is quiet  Lab Results:  Recent Labs    07/30/17 0545  WBC 5.4  HGB 12.2  HCT 36.8  PLT 248   BMET Recent Labs    07/30/17 0545  NA 140  K 3.7  CL 106  CO2 24  GLUCOSE 179*  BUN 6  CREATININE 0.67  CALCIUM 7.8*   PT/INR No results for input(s): LABPROT, INR in the last 72 hours. ABG No results for input(s): PHART, HCO3 in the last 72 hours.  Invalid input(s): PCO2, PO2  Studies/Results: No results found.  Anti-infectives: Anti-infectives (From admission, onward)   Start     Dose/Rate Route Frequency Ordered Stop   07/30/17 0600  ciprofloxacin (CIPRO) IVPB 400 mg     400 mg 200 mL/hr over 60 Minutes Intravenous  Once 07/29/17 1247 07/30/17 1242   07/30/17 0600  metroNIDAZOLE (FLAGYL) IVPB 500 mg     500 mg 100 mL/hr over 60 Minutes Intravenous  Once 07/29/17 1247 07/30/17 1240      Assessment/Plan: s/p Procedure(s): HERNIA REPAIR VENTRAL ADULT (N/A) EXPLORATORY LAPAROTOMY (N/A) EXTENSIVE LYSIS OF ADHESION (N/A) PARTIAL OMENTECTOMY (N/A) INSERTION OF MESH (N/A) Continue ng for now. I expect an ileus  Ambulate Pod 2 D/c foley Continue drain  LOS: 5 days    TOTH  III,PAUL S 08/01/2017

## 2017-08-01 NOTE — Progress Notes (Signed)
CRITICAL VALUE ALERT  Critical Value:  Troponin 0.04  Date & Time Notied:  5/12 1930  Provider Notified: NP [place]   Orders Received/Actions taken: No new orders, will continue to monitor

## 2017-08-01 NOTE — Progress Notes (Signed)
Transferred to 1437 per order. Report given to Mable Fill, Therapist, sports, by phone. Pt accompanied by Bethann Humble, RN, & Arvil Persons, NT. Gadiel John, [employer]

## 2017-08-01 NOTE — Significant Event (Signed)
Rapid Response Event Note  Overview: Time Called: 1432 Arrival Time: 1430 Event Type: Other (Comment)(AMS/Uncontrolled Knee Pain ) Notified by bedside RN, Lovena Le, in regards to patient having a change in mentation and knee pain. Also, patient was having O2 Sats of 88% on RA. Informed Lovena Le RN to place patient on [facility]. Upon arrival, patient was moaning and complaining of right knee pain.  Initial Focused Assessment: Neuro: Patient alert but oriented only to self and location, disoriented to situation and time.Pupils 2+, round, equal, brisk, and reactive to light. Patient able to follow basic commands with ease. Motor response in upper and lower extremities were equal but weak. Weakness greater in lower extremities than upper extremities. Pain concentrated in right knee, minimal swelling noted at right knee but no redness. Lower extremities do have 1+ pitting edema. Temp: 99.4 F oral.  Cardiac: S1 and S2 heard upon auscultation, ST (HR 120s), 1-2+ pulses in radial and pedal locations bilaterally, BP-mild hypertension-see vital signs.  Pulmonary: Breath sounds clear and diminished in all lung fields. No apparent respiratory distress. RR 20-25, O2 Sats 94-96% on 2LNC.  GI: NG tube to LIS, bile drainage for output, midline incision with honeycomb dressing, no redness at site, old drainage noted on dressing. Left JP output-serosanguineous.  Interventions: Repositioned patient to help with pain. EKG-12Lead obtained MD Olevia Bowens at bedside   Plan of Care (if not transferred): Transfer to tele.         Brisha Mccabe C

## 2017-08-01 NOTE — Progress Notes (Signed)
Rapid response called for new onset confusion, elevated BP & HR, increasing edema to rt leg & new onset severe pain to rt knee. Text page sent to Dr Aileen Fass. Debra [name], [employer]

## 2017-08-01 NOTE — Progress Notes (Signed)
BP 137/90 (BP Location: Right Arm)   Pulse (!) 123   Temp 99.4 F (37.4 C) (Oral)   Resp (!) 21   Ht 5\' 4"  (1.626 m)   Wt 120.7 kg (266 lb)   LMP 07/02/1973   SpO2 93%   BMI 45.66 kg/m   Got called by the nurse as patient heart rate and blood pressure were elevated she just received morphine and was a little bit confused. She is complaining of right knee pain, came back to her previous x-ray she has chondrocalcinosis compatible with pseudogout. We will go ahead and start her on steroids as she is n.p.o. and has an NG tube for her recent surgical abdominal intervention. We will transfer her to telemetry, will cycle cardiac enzymes, check a CBC, complete metabolic panel and a chest x-ray twelve-lead EKG just shows sinus tachycardia.  Will start on metoprolol IV as needed for heart rate greater than 110.

## 2017-08-01 NOTE — Progress Notes (Signed)
[facility] PROGRESS NOTE    Progress Note  Debra [name]  ASN:[id number] DOB: Aug 07, 1945 DOA: 07/27/2017 PCP: Vicenta Aly, FNP     Brief Narrative:   Debra [name] is an 72 y.o. female past medical history of diabetes, essential hypertension, morbid obesity recently admitted and discharged on 07/25/2017 for small bowel obstruction with ventral large hernia treated conservatively comes in again with nausea vomiting and no bowel movements for over 24 hours, found to have a small bowel obstruction.  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 Continue to monitor and replete electrolytes closely. Further management per surgery.  Type 2 diabetes, uncontrolled, with neuropathy ([place]) Continues to be n.p.o. for rate control with NPH and sliding scale continue current management.  Essential hypertension Blood pressure seems to be well controlled continue IV metoprolol as needed.  GERD (gastroesophageal reflux disease) Continue Pepcid IV.  Severe obesity (BMI >= 40) (HCC) Counseling.  Hyperlipidemia associated with type 2 diabetes mellitus ([place]) Hold statins, until she is able to tolerate diet   DVT prophylaxis: lovenox  Family Communication:none Disposition Plan/Barrier to D/C: home per surgery. Code Status:     Code Status Orders  (From admission, onward)        Start     Ordered   07/27/17 1247  Full code  Continuous     07/27/17 1247    Code Status History    Date Active Date Inactive Code Status Order ID Comments User Context   07/22/2017 1738 07/25/2017 1355 Full Code [id number]  Cristal Ford, DO ED   04/14/2013 1451 04/19/2013 1346 Full Code [id number]  Fanny Skates, MD Inpatient   04/13/2013 1054 04/14/2013 1451 Full Code [id number]  Earnstine Regal, PA-C ED        IV Access:    Peripheral IV   Procedures and  diagnostic studies:   No results found.   Medical Consultants:    None.  Anti-Infectives:   None  Subjective:    Debra [name] pain is controlled feels comfortable.  Objective:    Vitals:   07/31/17 2206 07/31/17 2348 08/01/17 0106 08/01/17 0519  BP: (!) 187/83 (!) 180/87 (!) 164/87 (!) 150/89  Pulse: (!) 104 89 93 98  Resp: 15   16  Temp: 98.3 F (36.8 C)   98.4 F (36.9 C)  TempSrc: Oral   Oral  SpO2: 90%   98%  Weight:      Height:        Intake/Output Summary (Last 24 hours) at 08/01/2017 1011 Last data filed at 08/01/2017 0530 Gross per 24 hour  Intake 2108.34 ml  Output 1970 ml  Net 138.34 ml   Filed Weights   07/27/17 1242  Weight: 120.7 kg (266 lb)    Exam: General exam: In no acute distress, NG tube in place Respiratory system: Good air movement and clear to auscultation. Cardiovascular system: S1 & S2 heard, RRR.   Gastrointestinal system: Deferred due to recent surgical intervention. Central nervous system: Alert and oriented. No focal neurological deficits. Extremities: No pedal edema. Skin: No rashes, lesions or ulcers Psychiatry: Judgement and insight appear normal. Mood & affect appropriate.    Data Reviewed:    Labs: Basic Metabolic Panel: Recent Labs  Lab 07/27/17 1039 07/28/17 0612 07/29/17 0531 07/30/17 0545  NA 138 141 140 140  K 4.3 3.4* 3.6 3.7  CL 104 105 108 106  CO2 22 26 22 24   GLUCOSE 296* 158* 87 179*  BUN 15 16 12 6   CREATININE 0.88 0.95 0.74 0.67  CALCIUM 9.0 8.1* 7.9* 7.8*  MG 1.3*  --  1.6*  --    GFR Estimated Creatinine Clearance: 83.8 mL/min (by C-G formula based on SCr of 0.67 mg/dL). Liver Function Tests: Recent Labs  Lab 07/27/17 1039 07/28/17 0612  AST 37 22  ALT 34 28  ALKPHOS 83 66  BILITOT 1.4* 0.9  PROT 7.8 6.6  ALBUMIN 3.8 3.1*   Recent Labs  Lab 07/27/17 1039  LIPASE 21   No results for input(s): AMMONIA in the last 168 hours. Coagulation profile No results for input(s):  INR, PROTIME in the last 168 hours.  CBC: Recent Labs  Lab 07/27/17 1039 07/28/17 0612 07/29/17 0531 07/30/17 0545  WBC 8.2 7.7 6.1 5.4  NEUTROABS 5.6  --   --   --   HGB 15.1* 13.1 13.4 12.2  HCT 44.3 40.5 41.5 36.8  MCV 90.8 92.5 92.8 91.5  PLT 270 232 230 248   Cardiac Enzymes: No results for input(s): CKTOTAL, CKMB, CKMBINDEX, TROPONINI in the last 168 hours. BNP (last 3 results) No results for input(s): PROBNP in the last 8760 hours. CBG: Recent Labs  Lab 07/31/17 1606 07/31/17 2137 07/31/17 2345 08/01/17 0418 08/01/17 0825  GLUCAP 167* 117* 136* 128* 138*   D-Dimer: No results for input(s): DDIMER in the last 72 hours. Hgb A1c: No results for input(s): HGBA1C in the last 72 hours. Lipid Profile: No results for input(s): CHOL, HDL, LDLCALC, TRIG, CHOLHDL, LDLDIRECT in the last 72 hours. Thyroid function studies: No results for input(s): TSH, T4TOTAL, T3FREE, THYROIDAB in the last 72 hours.  Invalid input(s): FREET3 Anemia work up: No results for input(s): VITAMINB12, FOLATE, FERRITIN, TIBC, IRON, RETICCTPCT in the last 72 hours. Sepsis Labs: Recent Labs  Lab 07/27/17 1039 07/28/17 0612 07/29/17 0531 07/30/17 0545  WBC 8.2 7.7 6.1 5.4   Microbiology Recent Results (from the past 240 hour(s))  Surgical pcr screen     Status: None   Collection Time: 07/30/17  5:33 AM  Result Value Ref Range Status   MRSA, PCR NEGATIVE NEGATIVE Final   Staphylococcus aureus NEGATIVE NEGATIVE Final    Comment: (NOTE) The Xpert SA Assay (FDA approved for NASAL specimens in patients 80 years of age and older), is one component of a comprehensive surveillance program. It is not intended to diagnose infection nor to guide or monitor treatment. Performed at [facility], [place] [address], [place], [place] [address]      Medications:   . insulin aspart  0-15 Units Subcutaneous Q4H  . insulin NPH Human  17 Units Subcutaneous BID AC & HS  . metoprolol  tartrate  5 mg Intravenous Q6H   Continuous Infusions: . dextrose 5 % and 0.45 % NaCl with KCl 20 mEq/L 100 mL/hr at 07/31/17 1550  . famotidine (PEPCID) IV Stopped (07/31/17 2328)      LOS: 5 days   [facility] Pager [phone]  *Please refer to [email or web address], password [email or web address] to get updated schedule on who will round on this patient, as hospitalists switch teams weekly. If 7PM-7AM, please contact night-coverage at [email or web address], password [email or web address] for any overnight needs.  08/01/2017, 10:11 AM

## 2017-08-01 NOTE — Progress Notes (Signed)
Dr Aileen Fass ordered transfer to telemetry after evaluating pt. Debra [name], [employer]

## 2017-08-01 NOTE — Progress Notes (Signed)
Pharmacy Antibiotic Note  Debra [name] is a 72 y.o. female admitted on 07/27/2017 with recurrent SBO with ventral hernia. Underwent ventral hernia repair, LOA, partial omentectomy on 07/30/17.  08/01/17 New onset confusion, severe knee pain, HTN and tachcardia. Pharmacy has been consulted for Vanc and Cefepime dosing for possible sepsis, intra-abdominal infection, joint infection? SCr slightly elevated, CrCl ~62. History of apparently severe PCN allergy and we have no record of her receiving a cephalosporin so consulted with Dr. Aileen Fass: changed cefepime to meropenem.  Plan: Meropenem 1g IV q8h. Vancomycin 2g IV x 1 then 1250mg  IV q24h.  Measure Vanc peak and trough at steady state. Goal AUC 400-500. Follow up renal fxn, culture results, and clinical course.   Height: 5\' 4"  (162.6 cm) Weight: 266 lb (120.7 kg) IBW/kg (Calculated) : 54.7  Temp (24hrs), Avg:98.9 F (37.2 C), Min:98.1 F (36.7 C), Max:100.2 F (37.9 C)  Recent Labs  Lab 07/27/17 1039 07/28/17 0612 07/29/17 0531 07/30/17 0545 08/01/17 1555  WBC 8.2 7.7 6.1 5.4 19.0*  CREATININE 0.88 0.95 0.74 0.67 1.08*    Estimated Creatinine Clearance: 62.1 mL/min (A) (by C-G formula based on SCr of 1.08 mg/dL (H)).    Allergies  Allergen Reactions  . Aspirin Nausea Only    And causes bruises  . Banana Hives  . Hydrocodone Hives  . Neomycin-Polymyxin-Gramicidin Itching and Swelling    Eye drops caused swelling in face and rash and itching on arms and neck  . Penicillins Hives and Itching    Has patient had a PCN reaction causing immediate rash, facial/tongue/throat swelling, SOB or lightheadedness with hypotension: Yes Has patient had a PCN reaction causing severe rash involving mucus membranes or skin necrosis: No Has patient had a PCN reaction that required hospitalization: Yes Has patient had a PCN reaction occurring within the last 10 years: Yes If all of the above answers are "NO", then may proceed with  Cephalosporin use.   . Tramadol Nausea And Vomiting  . Voltaren [Diclofenac Sodium] Nausea And Vomiting  . Latex Hives and Rash    Antimicrobials this admission: Cipro/Flagyl pre-op 5/10  Meropenem 5/12 >>  Vanc 5/12 >>   Dose adjustments this admission:   Microbiology results: BCx:  UCx:  Sputum:  MRSA PCR:  Thank you for allowing pharmacy to be a part of this patient's care.  Romeo Rabon, PharmD. Mobile: [phone]. 08/01/2017,5:10 PM.

## 2017-08-01 NOTE — Progress Notes (Signed)
Pt c/o severe pain (10/10) to rt knee, unrelieved by elevation & mild heat. Notified Dr Hassell Done & orders received for ice to knee. Debra [name], [employer]

## 2017-08-02 ENCOUNTER — Encounter (HOSPITAL_COMMUNITY): Payer: Self-pay | Admitting: General Surgery

## 2017-08-02 ENCOUNTER — Inpatient Hospital Stay (HOSPITAL_COMMUNITY): Payer: Medicare HMO

## 2017-08-02 DIAGNOSIS — M11261 Other chondrocalcinosis, right knee: Secondary | ICD-10-CM

## 2017-08-02 DIAGNOSIS — I361 Nonrheumatic tricuspid (valve) insufficiency: Secondary | ICD-10-CM

## 2017-08-02 DIAGNOSIS — R000 Tachycardia, unspecified: Secondary | ICD-10-CM

## 2017-08-02 DIAGNOSIS — F05 Delirium due to known physiological condition: Secondary | ICD-10-CM

## 2017-08-02 LAB — MAGNESIUM: Magnesium: 1.2 mg/dL — ABNORMAL LOW (ref 1.7–2.4)

## 2017-08-02 LAB — GLUCOSE, CAPILLARY
GLUCOSE-CAPILLARY: 156 mg/dL — AB (ref 65–99)
GLUCOSE-CAPILLARY: 199 mg/dL — AB (ref 65–99)
GLUCOSE-CAPILLARY: 232 mg/dL — AB (ref 65–99)
GLUCOSE-CAPILLARY: 191 mg/dL — AB (ref 65–99)
Glucose-Capillary: 197 mg/dL — ABNORMAL HIGH (ref 65–99)

## 2017-08-02 LAB — TROPONIN I
TROPONIN I: 0.03 ng/mL — AB (ref <0.03)
Troponin I: 0.03 ng/mL (ref <0.03)

## 2017-08-02 LAB — CBC WITH DIFFERENTIAL/PLATELET
BASOS PCT: 0 %
Basophils Absolute: 0 10*3/uL (ref 0.0–0.1)
EOS ABS: 0 10*3/uL (ref 0.0–0.7)
EOS PCT: 0 %
HCT: 38.2 % (ref 36.0–46.0)
Hemoglobin: 12.8 g/dL (ref 12.0–15.0)
LYMPHS ABS: 1.1 10*3/uL (ref 0.7–4.0)
Lymphocytes Relative: 5 %
MCH: 30.3 pg (ref 26.0–34.0)
MCHC: 33.5 g/dL (ref 30.0–36.0)
MCV: 90.5 fL (ref 78.0–100.0)
MONO ABS: 1.9 10*3/uL — AB (ref 0.1–1.0)
MONOS PCT: 9 %
NEUTROS PCT: 86 %
Neutro Abs: 18.4 10*3/uL — ABNORMAL HIGH (ref 1.7–7.7)
PLATELETS: 272 10*3/uL (ref 150–400)
RBC: 4.22 MIL/uL (ref 3.87–5.11)
RDW: 13.9 % (ref 11.5–15.5)
WBC: 21.5 10*3/uL — ABNORMAL HIGH (ref 4.0–10.5)

## 2017-08-02 LAB — COMPREHENSIVE METABOLIC PANEL
ALK PHOS: 73 U/L (ref 38–126)
ALT: 21 U/L (ref 14–54)
AST: 19 U/L (ref 15–41)
Albumin: 2.4 g/dL — ABNORMAL LOW (ref 3.5–5.0)
Anion gap: 11 (ref 5–15)
BUN: 12 mg/dL (ref 6–20)
CALCIUM: 8.1 mg/dL — AB (ref 8.9–10.3)
CO2: 24 mmol/L (ref 22–32)
CREATININE: 0.97 mg/dL (ref 0.44–1.00)
Chloride: 101 mmol/L (ref 101–111)
GFR calc non Af Amer: 58 mL/min — ABNORMAL LOW (ref >60)
Glucose, Bld: 202 mg/dL — ABNORMAL HIGH (ref 65–99)
Potassium: 4 mmol/L (ref 3.5–5.1)
SODIUM: 136 mmol/L (ref 135–145)
Total Bilirubin: 1 mg/dL (ref 0.3–1.2)
Total Protein: 6.2 g/dL — ABNORMAL LOW (ref 6.5–8.1)

## 2017-08-02 LAB — ECHOCARDIOGRAM COMPLETE
HEIGHTINCHES: 64 in
Weight: 4256 oz

## 2017-08-02 MED ORDER — METHOCARBAMOL 1000 MG/10ML IJ SOLN
500.0000 mg | Freq: Three times a day (TID) | INTRAVENOUS | Status: DC
  Administered 2017-08-02 – 2017-08-04 (×7): 500 mg via INTRAVENOUS
  Filled 2017-08-02 (×7): qty 550
  Filled 2017-08-02: qty 5

## 2017-08-02 MED ORDER — METHYLPREDNISOLONE SODIUM SUCC 40 MG IJ SOLR
25.0000 mg | Freq: Two times a day (BID) | INTRAMUSCULAR | Status: DC
  Administered 2017-08-02 – 2017-08-04 (×4): 25 mg via INTRAVENOUS
  Filled 2017-08-02 (×4): qty 1

## 2017-08-02 MED ORDER — ACETAMINOPHEN 10 MG/ML IV SOLN
1000.0000 mg | Freq: Four times a day (QID) | INTRAVENOUS | Status: AC
  Administered 2017-08-02 – 2017-08-03 (×4): 1000 mg via INTRAVENOUS
  Filled 2017-08-02 (×4): qty 100

## 2017-08-02 NOTE — Addendum Note (Signed)
Addendum  created 08/02/17 0833 by Deliah Boston, CRNA   Intraprocedure Staff edited

## 2017-08-02 NOTE — Progress Notes (Signed)
  Echocardiogram 2D Echocardiogram has been performed.  Darlina Sicilian M 08/02/2017, 9:23 AM

## 2017-08-02 NOTE — Progress Notes (Signed)
[facility] PROGRESS NOTE    Progress Note  Debra [name]  QBH:[id number] DOB: [birth year] DOA: 07/27/2017 PCP: Vicenta Aly, FNP     Brief Narrative:   Debra [name] is an 72 y.o. female past medical history of diabetes, essential hypertension, morbid obesity recently admitted and discharged on 07/25/2017 for small bowel obstruction with ventral large hernia treated conservatively comes in again with nausea vomiting and no bowel movements for over 24 hours, found to have a small bowel obstruction.  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 Continue to monitor and replete electrolytes closely. Further management per surgery.  New acute encephalopathy: She has remained afebrile but has a leukocytosis, which is likely due to steroids. CT scan shows no signs of infection. lytes yesterday were within normal limits her basic metabolic panel this morning is pending.  We will have to continue monitor closely potassium and magnesium. Started on Haldol IV as needed. Cardiac biomarkers have remained flat she denies any chest pain or shortness of breath. This seems to be more acute confusional state in the setting of surgical intervention and narcotic use. We will try to minimize narcotics continue to use Haldol, check a 12-lead EKG to measure QTC.  Leukocytosis: She has remained afebrile, chest x-ray showed mild interstitial infiltrates question atelectasis She was started empirically on IV vancomycin and meropenem.  We will have a low threshold to DC these if culture data remained remains negative Her data has remained negative. Started empirically on IV steroids for pseudogout, which is likely contributing to her leukocytosis.  Type 2 diabetes, uncontrolled, with neuropathy ([name]) Continues to be n.p.o. for rate control with NPH and sliding scale continue  current management.  Essential hypertension Blood pressure seems to be well controlled continue IV metoprolol as needed.  GERD (gastroesophageal reflux disease) Continue Pepcid IV.  Severe obesity (BMI >= 40) (HCC) Counseling.  Hyperlipidemia associated with type 2 diabetes mellitus ([name]) Hold statins, until she is able to tolerate diet  Pseudogout: Due to her nature of being n.p.o. she was started empirically on IV steroids.  DVT prophylaxis: lovenox  Family Communication:none Disposition Plan/Barrier to D/C: Probably skilled nursing facility to 3 days. Code Status:     Code Status Orders  (From admission, onward)        Start     Ordered   07/27/17 1247  Full code  Continuous     07/27/17 1247    Code Status History    Date Active Date Inactive Code Status Order ID Comments User Context   07/22/2017 1738 07/25/2017 1355 Full Code [id number]  Cristal Ford, DO ED   04/14/2013 1451 04/19/2013 1346 Full Code [id number]  Fanny Skates, MD Inpatient   04/13/2013 1054 04/14/2013 1451 Full Code [id number]  Earnstine Regal, PA-C ED        IV Access:    Peripheral IV   Procedures and diagnostic studies:   Ct Abdomen Pelvis W Contrast  Result Date: 08/02/2017 CLINICAL DATA:  Acute onset of nausea and vomiting. Recently diagnosed with small bowel obstruction. EXAM: CT ABDOMEN AND PELVIS WITH CONTRAST TECHNIQUE: Multidetector CT imaging of the abdomen and pelvis was performed using the standard protocol following bolus administration of intravenous contrast. CONTRAST:  154mL ISOVUE-300 IOPAMIDOL (ISOVUE-300) INJECTION 61% COMPARISON:  CT of the abdomen and pelvis from 07/22/2017 FINDINGS: Lower chest: Mild bibasilar atelectasis or scarring is noted. The visualized  portions of the mediastinum are unremarkable. Hepatobiliary: Pneumobilia is noted, likely reflecting prior instrumentation at the duodenal ampulla. The liver is otherwise unremarkable appearance. The patient is status  post cholecystectomy. The common bile duct is grossly unremarkable in appearance. Pancreas: The pancreas is within normal limits. Spleen: The spleen is unremarkable in appearance. Adrenals/Urinary Tract: The adrenal glands are unremarkable in appearance. Mild nonspecific perinephric stranding is noted bilaterally. There is no evidence of hydronephrosis. No renal or ureteral stones are identified. Stomach/Bowel: The stomach is unremarkable in appearance. The small bowel is within normal limits. The patient is status post appendectomy. Minimal wall thickening along the transverse colon is likely reactive in nature. Scattered diverticulosis is noted along the descending and sigmoid colon, without evidence of diverticulitis. Vascular/Lymphatic: Scattered calcification is seen along the abdominal aorta and its branches. The abdominal aorta is otherwise grossly unremarkable. The inferior vena cava is grossly unremarkable. No retroperitoneal lymphadenopathy is seen. No pelvic sidewall lymphadenopathy is identified. Reproductive: The bladder is decompressed, with a Foley catheter in place. The patient is status post hysterectomy. A mildly prominent 3.9 cm left adnexal cystic focus is noted, only minimally changed from 2017 and likely benign. Other: Postoperative inflammation and scattered soft tissue air is noted along the anterior abdominal wall, with diffuse omental inflammation. Soft tissue inflammation tracks particularly about small bowel loops at the right mid abdomen. A drainage catheter is seen underlying the surgical site, without a significant fluid collection. Musculoskeletal: No acute osseous abnormalities are identified. The visualized musculature is unremarkable in appearance. IMPRESSION: 1. Postoperative inflammation and soft tissue air along the anterior abdominal wall, with diffuse omental inflammation. Soft tissue inflammation tracks particularly about small bowel loops at the right mid abdomen, without  evidence of bowel perforation at this time. 2. Drainage catheter underlying the surgical site, without a significant fluid collection. 3. Minimal wall thickening along the transverse colon is likely reactive in nature. 4. Scattered diverticulosis along the descending and sigmoid colon, without evidence of diverticulitis. 5. Mildly prominent 3.9 cm left adnexal cystic focus is relatively stable from prior studies and likely benign. 6. Pneumobilia likely reflects prior instrumentation at the duodenal ampulla. Aortic Atherosclerosis (ICD10-I70.0). Electronically Signed   By: Garald Balding M.D.   On: 08/02/2017 00:37   Dg Chest Port 1 View  Result Date: 08/01/2017 CLINICAL DATA:  Tachycardia.  Ventral hernia repair on 07/30/2017. EXAM: PORTABLE CHEST 1 VIEW COMPARISON:  07/27/2017 FINDINGS: Cardiomediastinal silhouette is unchanged. An NG tube is identified entering the stomach with tip off the field of view. This is a low volume film with scattered areas of subsegmental atelectasis within bilateral mid and lower lungs. There is no evidence of pneumothorax or definite pleural effusion. Mild pulmonary vascular congestion is present. IMPRESSION: Scattered subsegmental atelectasis within bilateral mid and lower lungs. Mild pulmonary vascular congestion. Electronically Signed   By: Margarette Canada M.D.   On: 08/01/2017 17:50     Medical Consultants:    None.  Anti-Infectives:   None  Subjective:    Debra [name] the nurse relates she had an episode of confusion overnight for which she was given Haldol, this morning she relates she is uncomfortable in the bed she knows where she is as she knows what is happened to her she does not recall being confused.  She seems to be fluctuating within a 24-hour.  Objective:    Vitals:   08/01/17 1953 08/01/17 2230 08/01/17 2353 08/02/17 0337  BP: (!) 104/56 134/78 (!) 142/80 136/81  Pulse: Marland Kitchen)  116 97 98 85  Resp: (!) 22 20  19   Temp: 98.9 F (37.2 C) 98.4 F  (36.9 C) 98.7 F (37.1 C) 97.7 F (36.5 C)  TempSrc: Oral Oral Oral Oral  SpO2: 95% 93% 93% 95%  Weight:      Height:        Intake/Output Summary (Last 24 hours) at 08/02/2017 0812 Last data filed at 08/02/2017 0600 Gross per 24 hour  Intake 3160.41 ml  Output 2615 ml  Net 545.41 ml   Filed Weights   07/27/17 1242  Weight: 120.7 kg (266 lb)    Exam: General exam: In no acute distress, NG tube in place Respiratory system: Good air movement and clear to auscultation. Cardiovascular system: S1 & S2 heard, RRR.   Gastrointestinal system: Deferred due to recent surgical intervention. Central nervous system: Alert and oriented. No focal neurological deficits. Extremities: No pedal edema. Skin: No rashes, lesions or ulcers   Data Reviewed:    Labs: Basic Metabolic Panel: Recent Labs  Lab 07/27/17 1039 07/28/17 0612 07/29/17 0531 07/30/17 0545 08/01/17 1555  NA 138 141 140 140 136  K 4.3 3.4* 3.6 3.7 4.2  CL 104 105 108 106 99*  CO2 22 26 22 24 26   GLUCOSE 296* 158* 87 179* 185*  BUN 15 16 12 6 10   CREATININE 0.88 0.95 0.74 0.67 1.08*  CALCIUM 9.0 8.1* 7.9* 7.8* 8.1*  MG 1.3*  --  1.6*  --   --    GFR Estimated Creatinine Clearance: 62.1 mL/min (A) (by C-G formula based on SCr of 1.08 mg/dL (H)). Liver Function Tests: Recent Labs  Lab 07/27/17 1039 07/28/17 0612  AST 37 22  ALT 34 28  ALKPHOS 83 66  BILITOT 1.4* 0.9  PROT 7.8 6.6  ALBUMIN 3.8 3.1*   Recent Labs  Lab 07/27/17 1039  LIPASE 21   No results for input(s): AMMONIA in the last 168 hours. Coagulation profile No results for input(s): INR, PROTIME in the last 168 hours.  CBC: Recent Labs  Lab 07/27/17 1039  07/29/17 0531 07/30/17 0545 08/01/17 1555 08/01/17 1819 08/02/17 0722  WBC 8.2   < > 6.1 5.4 19.0* 17.5* 21.5*  NEUTROABS 5.6  --   --   --  15.2* 14.4* 18.4*  HGB 15.1*   < > 13.4 12.2 13.4 13.3 12.8  HCT 44.3   < > 41.5 36.8 40.5 39.7 38.2  MCV 90.8   < > 92.8 91.5 91.4 90.8  90.5  PLT 270   < > 230 248 320 291 272   < > = values in this interval not displayed.   Cardiac Enzymes: Recent Labs  Lab 08/01/17 1819 08/02/17 0015  TROPONINI 0.04* 0.03*   BNP (last 3 results) No results for input(s): PROBNP in the last 8760 hours. CBG: Recent Labs  Lab 08/01/17 1634 08/01/17 2001 08/01/17 2350 08/02/17 0336 08/02/17 0750  GLUCAP 170* 140* 129* 156* 197*   D-Dimer: No results for input(s): DDIMER in the last 72 hours. Hgb A1c: No results for input(s): HGBA1C in the last 72 hours. Lipid Profile: No results for input(s): CHOL, HDL, LDLCALC, TRIG, CHOLHDL, LDLDIRECT in the last 72 hours. Thyroid function studies: No results for input(s): TSH, T4TOTAL, T3FREE, THYROIDAB in the last 72 hours.  Invalid input(s): FREET3 Anemia work up: No results for input(s): VITAMINB12, FOLATE, FERRITIN, TIBC, IRON, RETICCTPCT in the last 72 hours. Sepsis Labs: Recent Labs  Lab 07/30/17 0545 08/01/17 1555 08/01/17 1819 08/02/17 7829  WBC 5.4 19.0* 17.5* 21.5*   Microbiology Recent Results (from the past 240 hour(s))  Surgical pcr screen     Status: None   Collection Time: 07/30/17  5:33 AM  Result Value Ref Range Status   MRSA, PCR NEGATIVE NEGATIVE Final   Staphylococcus aureus NEGATIVE NEGATIVE Final    Comment: (NOTE) The Xpert SA Assay (FDA approved for NASAL specimens in patients 87 years of age and older), is one component of a comprehensive surveillance program. It is not intended to diagnose infection nor to guide or monitor treatment. Performed at [facility], [place] [address] [place], [place] [address]   Culture, blood (Routine X 2) w Reflex to ID Panel     Status: None (Preliminary result)   Collection Time: 08/01/17  6:19 PM  Result Value Ref Range Status   Specimen Description BLOOD RIGHT ANTECUBITAL  Final   Special Requests   Final    BOTTLES DRAWN AEROBIC AND ANAEROBIC Blood Culture adequate volume Performed at [facility], [place] [address] [place], [place] [address]    Culture PENDING  Incomplete   Report Status PENDING  Incomplete  Culture, blood (Routine X 2) w Reflex to ID Panel     Status: None (Preliminary result)   Collection Time: 08/01/17  6:19 PM  Result Value Ref Range Status   Specimen Description BLOOD RIGHT HAND  Final   Special Requests   Final    BOTTLES DRAWN AEROBIC AND ANAEROBIC Blood Culture adequate volume Performed at [facility], [place] [address], [place], [place] [address]    Culture PENDING  Incomplete   Report Status PENDING  Incomplete     Medications:   . insulin aspart  0-15 Units Subcutaneous Q4H  . insulin NPH Human  17 Units Subcutaneous BID AC & HS  . iopamidol      . methylPREDNISolone (SOLU-MEDROL) injection  40 mg Intravenous Q12H  . metoprolol tartrate  5 mg Intravenous Q6H   Continuous Infusions: . dextrose 5 % and 0.45% NaCl 100 mL/hr at 08/02/17 0620  . famotidine (PEPCID) IV Stopped (08/01/17 2152)  . meropenem (MERREM) IV Stopped (08/02/17 0244)  . vancomycin        LOS: 6 days   [facility] Pager [phone]  *Please refer to [email or web address], password [email or web address] to get updated schedule on who will round on this patient, as hospitalists switch teams weekly. If 7PM-7AM, please contact night-coverage at [email or web address], password [email or web address] for any overnight needs.  08/02/2017, 8:12 AM

## 2017-08-02 NOTE — Progress Notes (Addendum)
[facility] Progress Note  3 Days Post-Op  Subjective: CC: abdominal pain  Patient reports abdominal pain, but trying to minimize narcotics due to acute confusion. Currently appropriate and answering questions/following commands. Tried to get up with RN and PT but was unable to do so. Reports passing flatus. NGT with bilious drainage. Not using IS.   Objective: Vital signs in last 24 hours: Temp:  [97.7 F (36.5 C)-100.2 F (37.9 C)] 97.7 F (36.5 C) (05/13 0337) Pulse Rate:  [85-133] 85 (05/13 0337) Resp:  [16-28] 19 (05/13 0337) BP: (104-170)/(56-97) 136/81 (05/13 0337) SpO2:  [87 %-95 %] 95 % (05/13 0337) Last BM Date: 07/25/17  Intake/Output from previous day: 05/12 0701 - 05/13 0700 In: 3160.4 [I.V.:2360.4; IV Piggyback:800] Out: 2615 [Urine:1575; Emesis/NG output:900; Drains:140] Intake/Output this shift: Total I/O In: -  Out: 420 [Urine:400; Drains:20]  PE: Gen:  Alert, NAD Card:  Regular rate and rhythm, pedal pulses 2+ BL Pulm:  Normal effort, clear to auscultation bilaterally Abd: Soft, appropriately tender, non-distended, bowel sounds present, incision C/D/I with staples present, drain with SS fluid Skin: warm and dry, no rashes  Psych: A&Ox3   Lab Results:  Recent Labs    08/01/17 1819 08/02/17 0722  WBC 17.5* 21.5*  HGB 13.3 12.8  HCT 39.7 38.2  PLT 291 272   BMET Recent Labs    08/01/17 1555 08/02/17 0722  NA 136 136  K 4.2 4.0  CL 99* 101  CO2 26 24  GLUCOSE 185* 202*  BUN 10 12  CREATININE 1.08* 0.97  CALCIUM 8.1* 8.1*   PT/INR No results for input(s): LABPROT, INR in the last 72 hours. CMP     Component Value Date/Time   NA 136 08/02/2017 0722   NA 143 07/08/2015 1023   K 4.0 08/02/2017 0722   CL 101 08/02/2017 0722   CO2 24 08/02/2017 0722   GLUCOSE 202 (H) 08/02/2017 0722   BUN 12 08/02/2017 0722   BUN 21 07/08/2015 1023   CREATININE 0.97 08/02/2017 0722   CREATININE 0.86 05/22/2016 0903   CALCIUM 8.1 (L)  08/02/2017 0722   PROT 6.2 (L) 08/02/2017 0722   PROT 6.5 10/03/2013 0920   ALBUMIN 2.4 (L) 08/02/2017 0722   ALBUMIN 4.3 10/03/2013 0920   AST 19 08/02/2017 0722   ALT 21 08/02/2017 0722   ALKPHOS 73 08/02/2017 0722   BILITOT 1.0 08/02/2017 0722   GFRNONAA 58 (L) 08/02/2017 0722   GFRNONAA 69 05/22/2016 0903   GFRAA >60 08/02/2017 0722   GFRAA 80 05/22/2016 0903   Lipase     Component Value Date/Time   LIPASE 21 07/27/2017 1039       Studies/Results: Ct Abdomen Pelvis W Contrast  Result Date: 08/02/2017 CLINICAL DATA:  Acute onset of nausea and vomiting. Recently diagnosed with small bowel obstruction. EXAM: CT ABDOMEN AND PELVIS WITH CONTRAST TECHNIQUE: Multidetector CT imaging of the abdomen and pelvis was performed using the standard protocol following bolus administration of intravenous contrast. CONTRAST:  172mL ISOVUE-300 IOPAMIDOL (ISOVUE-300) INJECTION 61% COMPARISON:  CT of the abdomen and pelvis from 07/22/2017 FINDINGS: Lower chest: Mild bibasilar atelectasis or scarring is noted. The visualized portions of the mediastinum are unremarkable. Hepatobiliary: Pneumobilia is noted, likely reflecting prior instrumentation at the duodenal ampulla. The liver is otherwise unremarkable appearance. The patient is status post cholecystectomy. The common bile duct is grossly unremarkable in appearance. Pancreas: The pancreas is within normal limits. Spleen: The spleen is unremarkable in appearance. Adrenals/Urinary Tract: The adrenal glands are  unremarkable in appearance. Mild nonspecific perinephric stranding is noted bilaterally. There is no evidence of hydronephrosis. No renal or ureteral stones are identified. Stomach/Bowel: The stomach is unremarkable in appearance. The small bowel is within normal limits. The patient is status post appendectomy. Minimal wall thickening along the transverse colon is likely reactive in nature. Scattered diverticulosis is noted along the descending and  sigmoid colon, without evidence of diverticulitis. Vascular/Lymphatic: Scattered calcification is seen along the abdominal aorta and its branches. The abdominal aorta is otherwise grossly unremarkable. The inferior vena cava is grossly unremarkable. No retroperitoneal lymphadenopathy is seen. No pelvic sidewall lymphadenopathy is identified. Reproductive: The bladder is decompressed, with a Foley catheter in place. The patient is status post hysterectomy. A mildly prominent 3.9 cm left adnexal cystic focus is noted, only minimally changed from 2017 and likely benign. Other: Postoperative inflammation and scattered soft tissue air is noted along the anterior abdominal wall, with diffuse omental inflammation. Soft tissue inflammation tracks particularly about small bowel loops at the right mid abdomen. A drainage catheter is seen underlying the surgical site, without a significant fluid collection. Musculoskeletal: No acute osseous abnormalities are identified. The visualized musculature is unremarkable in appearance. IMPRESSION: 1. Postoperative inflammation and soft tissue air along the anterior abdominal wall, with diffuse omental inflammation. Soft tissue inflammation tracks particularly about small bowel loops at the right mid abdomen, without evidence of bowel perforation at this time. 2. Drainage catheter underlying the surgical site, without a significant fluid collection. 3. Minimal wall thickening along the transverse colon is likely reactive in nature. 4. Scattered diverticulosis along the descending and sigmoid colon, without evidence of diverticulitis. 5. Mildly prominent 3.9 cm left adnexal cystic focus is relatively stable from prior studies and likely benign. 6. Pneumobilia likely reflects prior instrumentation at the duodenal ampulla. Aortic Atherosclerosis (ICD10-I70.0). Electronically Signed   By: Garald Balding M.D.   On: 08/02/2017 00:37   Dg Chest Port 1 View  Result Date: 08/01/2017 CLINICAL  DATA:  Tachycardia.  Ventral hernia repair on 07/30/2017. EXAM: PORTABLE CHEST 1 VIEW COMPARISON:  07/27/2017 FINDINGS: Cardiomediastinal silhouette is unchanged. An NG tube is identified entering the stomach with tip off the field of view. This is a low volume film with scattered areas of subsegmental atelectasis within bilateral mid and lower lungs. There is no evidence of pneumothorax or definite pleural effusion. Mild pulmonary vascular congestion is present. IMPRESSION: Scattered subsegmental atelectasis within bilateral mid and lower lungs. Mild pulmonary vascular congestion. Electronically Signed   By: Margarette Canada M.D.   On: 08/01/2017 17:50    Anti-infectives: Anti-infectives (From admission, onward)   Start     Dose/Rate Route Frequency Ordered Stop   08/02/17 1800  vancomycin (VANCOCIN) 1,250 mg in sodium chloride 0.9 % 250 mL IVPB     1,250 mg 166.7 mL/hr over 90 Minutes Intravenous Every 24 hours 08/01/17 1717     08/01/17 1800  vancomycin (VANCOCIN) 2,000 mg in sodium chloride 0.9 % 500 mL IVPB     2,000 mg 250 mL/hr over 120 Minutes Intravenous  Once 08/01/17 1717 08/01/17 2045   08/01/17 1800  meropenem (MERREM) 1 g in sodium chloride 0.9 % 100 mL IVPB     1 g 200 mL/hr over 30 Minutes Intravenous Every 8 hours 08/01/17 1722     07/30/17 0600  ciprofloxacin (CIPRO) IVPB 400 mg     400 mg 200 mL/hr over 60 Minutes Intravenous  Once 07/29/17 1247 07/30/17 1242   07/30/17 0600  metroNIDAZOLE (FLAGYL)  IVPB 500 mg     500 mg 100 mL/hr over 60 Minutes Intravenous  Once 07/29/17 1247 07/30/17 1240     Assessment/Plan HTN HLD Hx of diverticulitis T2DM- SSI Chronic venous insufficiency LLE Cellulitis Morbid obesity  Readmission for SBO likely 2/2 large ventral hernia  - s/p ex-lap with extensive LOA, partial omentectomy and repair of ventral hernia with mesh - 07/30/2017 Marlou Starks - POD#3 - drain with 140 cc out SS - continue drain  - mobilize as tolerated, encourage IS -  will put on scheduled tylenol IV and robaxin to achieve better pain control and try to minimize narcotic need - clamping trial with ice chips - check KUB in AM Leukocytosis - WBC 21.5, Tmax 100.2 - potentially related to steroids and pseudogout? Acute encephalopathy - ?narcotics, try to minimize and continue to monitor   FEN:NPO, IVF; clamping trials and allow ice chips VTE: SCD's AL:PFXTK/WIOXBD periop; vanc/merrem 5/12>> Foley:none Follow up:TBD   LOS: 6 days   Brigid Re , [facility] Surgery 08/02/2017, 10:55 AM Pager: [phone] Consults: [phone]  Agree with above.  Needs to move.  But apparently with PT and nurse today, they could not get her out of bed.  Alphonsa Overall, MD, [facility] Surgery Pager: [phone] Office phone:  [phone]

## 2017-08-02 NOTE — Evaluation (Signed)
Physical Therapy Evaluation Patient Details Name: Debra [name] MRN: [id number] DOB: January 29, 1946 Today's Date: 08/02/2017   History of Present Illness  72 y.o. female past medical history of diabetes, essential hypertension, morbid obesity recently admitted and discharged on 07/25/2017 for small bowel obstruction with ventral large hernia treated conservatively.  Pt presented to ED 07/27/17 and found to have a small bowel obstruction and s/p ventral hernia repair 07/30/17.  Pt now with new acute encephalopathy   Clinical Impression  Pt admitted with above diagnosis. Pt currently with functional limitations due to the deficits listed below (see PT Problem List).  Pt will benefit from skilled PT to increase their independence and safety with mobility to allow discharge to the venue listed below.   Pt attempted to mobilize today however only able to perform sitting EOB with significant assist.  Pt unable to assist with scoot or sit to stand transfer for OOB, so recommend nursing use lift for OOB at this time.  Pt reports previously being able to ambulate in hospital however currently unable.  Recommend SNF upon d/c at this time.     Follow Up Recommendations SNF    Equipment Recommendations  Rolling walker with 5" wheels;3in1 (PT)    Recommendations for Other Services       Precautions / Restrictions Precautions Precautions: Fall Precaution Comments: NG tube, currently on oxygen Restrictions Weight Bearing Restrictions: No      Mobility  Bed Mobility Overal bed mobility: Needs Assistance Bed Mobility: Supine to Sit;Sit to Supine     Supine to sit: Max assist Sit to supine: Total assist;+2 for physical assistance   General bed mobility comments: pt required LEs assist over EOB and assist for trunk upright, pt fatigued quickly and required total assist +2 for back to supine and scooting up [facility]  Transfers                 General transfer comment: attempted scoot transfer and sit  to stand however pt unable to safely assist; would require lift for OOB at this time  Ambulation/Gait                Stairs            Wheelchair Mobility    Modified Rankin (Stroke Patients Only)       Balance Overall balance assessment: Needs assistance Sitting-balance support: Bilateral upper extremity supported;Feet supported Sitting balance-Leahy Scale: Poor Sitting balance - Comments: requires UE support                                     Pertinent Vitals/Pain Pain Assessment: Faces Faces Pain Scale: Hurts whole lot Pain Location: abdomen Pain Descriptors / Indicators: Sore;Guarding;Grimacing Pain Intervention(s): Repositioned;Monitored during session;Limited activity within patient's tolerance    Home Living Family/patient expects to be discharged to:: Private residence Living Arrangements: Spouse/significant other   Type of Home: [place]: One level Home Equipment: [place] - single point Additional Comments: per previous admission, lives with spouse in one level home    Prior Function Level of Independence: Independent               Hand Dominance        Extremity/Trunk Assessment   Upper Extremity Assessment Upper Extremity Assessment: Generalized weakness    Lower Extremity Assessment Lower Extremity Assessment: Generalized weakness       Communication  Communication: No difficulties  Cognition Arousal/Alertness: Awake/alert Behavior During Therapy: WFL for tasks assessed/performed Overall Cognitive Status: Impaired/Different from baseline Area of Impairment: Following commands;Safety/judgement                       Following Commands: Follows one step commands consistently;Follows one step commands with increased time Safety/Judgement: Decreased awareness of safety     General Comments: able to follow simple commands, no family present for baseline      General Comments       Exercises     Assessment/Plan    PT Assessment Patient needs continued PT services  PT Problem List Decreased strength;Decreased mobility;Decreased activity tolerance;Decreased balance;Decreased knowledge of use of DME;Decreased cognition;Obesity;Decreased safety awareness       PT Treatment Interventions DME instruction;Therapeutic activities;Therapeutic exercise;Functional mobility training;Gait training;Patient/family education;Balance training    PT Goals (Current goals can be found in the Care Plan section)  Acute Rehab PT Goals Patient Stated Goal: pt agreeable to mobilize, wants to walk again PT Goal Formulation: With patient Time For Goal Achievement: 08/16/17 Potential to Achieve Goals: Good    Frequency Min 3X/week   Barriers to discharge        Co-evaluation               AM-PAC PT "6 Clicks" Daily Activity  Outcome Measure Difficulty turning over in bed (including adjusting bedclothes, sheets and blankets)?: Unable Difficulty moving from lying on back to sitting on the side of the bed? : Unable Difficulty sitting down on and standing up from a chair with arms (e.g., wheelchair, bedside commode, etc,.)?: Unable Help needed moving to and from a bed to chair (including a wheelchair)?: Total Help needed walking in hospital room?: Total Help needed climbing 3-5 steps with a railing? : Total 6 Click Score: 6    End of Session Equipment Utilized During Treatment: Gait belt;Oxygen Activity Tolerance: Patient limited by fatigue Patient left: in bed;with bed alarm set;with call bell/phone within reach Nurse Communication: Mobility status;Need for lift equipment PT Visit Diagnosis: Other abnormalities of gait and mobility (R26.89)    Time: 5027-7412 PT Time Calculation (min) (ACUTE ONLY): 23 min   Charges:   PT Evaluation $PT Eval Moderate Complexity: 1 Mod     PT G Codes:        Carmelia Bake, PT, DPT 08/02/2017 Pager: [phone]  York Ram  E 08/02/2017, 11:51 AM

## 2017-08-03 LAB — BASIC METABOLIC PANEL
ANION GAP: 10 (ref 5–15)
BUN: 15 mg/dL (ref 6–20)
CHLORIDE: 105 mmol/L (ref 101–111)
CO2: 25 mmol/L (ref 22–32)
CREATININE: 0.88 mg/dL (ref 0.44–1.00)
Calcium: 8.2 mg/dL — ABNORMAL LOW (ref 8.9–10.3)
GFR calc non Af Amer: >60 mL/min (ref >60)
Glucose, Bld: 196 mg/dL — ABNORMAL HIGH (ref 65–99)
Potassium: 4 mmol/L (ref 3.5–5.1)
Sodium: 140 mmol/L (ref 135–145)

## 2017-08-03 LAB — GLUCOSE, CAPILLARY
GLUCOSE-CAPILLARY: 214 mg/dL — AB (ref 65–99)
GLUCOSE-CAPILLARY: 159 mg/dL — AB (ref 65–99)
GLUCOSE-CAPILLARY: 185 mg/dL — AB (ref 65–99)
GLUCOSE-CAPILLARY: 185 mg/dL — AB (ref 65–99)
Glucose-Capillary: 173 mg/dL — ABNORMAL HIGH (ref 65–99)
Glucose-Capillary: 179 mg/dL — ABNORMAL HIGH (ref 65–99)
Glucose-Capillary: 194 mg/dL — ABNORMAL HIGH (ref 65–99)

## 2017-08-03 MED ORDER — INSULIN NPH (HUMAN) (ISOPHANE) 100 UNIT/ML ~~LOC~~ SUSP
20.0000 [IU] | Freq: Two times a day (BID) | SUBCUTANEOUS | Status: DC
  Administered 2017-08-03 – 2017-08-05 (×4): 20 [IU] via SUBCUTANEOUS

## 2017-08-03 MED ORDER — ENOXAPARIN SODIUM 40 MG/0.4ML ~~LOC~~ SOLN
40.0000 mg | SUBCUTANEOUS | Status: DC
  Administered 2017-08-03 – 2017-08-04 (×2): 40 mg via SUBCUTANEOUS
  Filled 2017-08-03 (×2): qty 0.4

## 2017-08-03 NOTE — Care Management Important Message (Signed)
Important Message  Patient Details  Name: Debra [name] MRN: [id number] Date of Birth: 1946/02/16   Medicare Important Message Given:  Yes    Kerin Salen 08/03/2017, 11:27 [place] Message  Patient Details  Name: Debra [name] MRN: [id number] Date of Birth: [birth year]   Medicare Important Message Given:  Yes    Kerin Salen 08/03/2017, 11:27 AM

## 2017-08-03 NOTE — Progress Notes (Addendum)
4 Days Post-Op    CC: Abdominal pain  Subjective: Up to the bedside commode trying to have a bowel movement she is passing some flatus.  No nausea or vomiting with the tube clamped.  Nothing recorded from the NG yesterday.  She is hard to examine up in the chair, but I did hear many bowel sounds.  She has not been walking.  He does have PT ordered.  She needs quite a bit of assistance for mobilization.  Objective: Vital signs in last 24 hours: Temp:  [97.8 F (36.6 C)-98.3 F (36.8 C)] 98.3 F (36.8 C) (05/14 0401) Pulse Rate:  [75-83] 78 (05/14 1138) Resp:  [17-18] 18 (05/14 0401) BP: (134-157)/(69-80) 157/69 (05/14 1138) SpO2:  [92 %-94 %] 94 % (05/14 0401) Last BM Date: 07/25/17 50 PO 3000 IV 750 urine 100 emesis Drain 170 Afebrile, VSS Glucose 196 CT on 5/12:Postoperative inflammation and soft tissue air along the anterior abdominal wall, with diffuse omental inflammation. Soft tissue inflammation tracks particularly about small bowel loops at the right mid abdomen, without evidence of bowel perforation at this time. 2. Drainage catheter underlying the surgical site, without a significant fluid collection. 3. Minimal wall thickening along the transverse colon is likely reactive in nature. 4. Scattered diverticulosis along the descending and sigmoid colon, without evidence of diverticulitis. 5. Mildly prominent 3.9 cm left adnexal cystic focus is relatively stable from prior studies and likely benign. 6. Pneumobilia likely reflects prior instrumentation at the duodenal ampulla. Intake/Output from previous day: 05/13 0701 - 05/14 0700 In: 3000.8 [P.O.:50; I.V.:1735.8; IV Piggyback:1215] Out: 1020 [Urine:750; Emesis/NG output:100; Drains:170] Intake/Output this shift: Total I/O In: -  Out: 350 [Urine:350]  General appearance: alert, cooperative and no distress Resp: clear to auscultation bilaterally GI:   She is up on the bedside commode.  Bowel sounds are  hypoactive she is having flatus.  What I can see of the incision looks good.  Lab Results:  Recent Labs    08/01/17 1819 08/02/17 0722  WBC 17.5* 21.5*  HGB 13.3 12.8  HCT 39.7 38.2  PLT 291 272    BMET Recent Labs    08/02/17 0722 08/03/17 0425  NA 136 140  K 4.0 4.0  CL 101 105  CO2 24 25  GLUCOSE 202* 196*  BUN 12 15  CREATININE 0.97 0.88  CALCIUM 8.1* 8.2*   PT/INR No results for input(s): LABPROT, INR in the last 72 hours.  Recent Labs  Lab 07/28/17 0612 08/02/17 0722  AST 22 19  ALT 28 21  ALKPHOS 66 73  BILITOT 0.9 1.0  PROT 6.6 6.2*  ALBUMIN 3.1* 2.4*     Lipase     Component Value Date/Time   LIPASE 21 07/27/2017 1039     Medications: . enoxaparin (LOVENOX) injection  40 mg Subcutaneous Q24H  . insulin aspart  0-15 Units Subcutaneous Q4H  . insulin NPH Human  20 Units Subcutaneous BID AC & HS  . methylPREDNISolone (SOLU-MEDROL) injection  25 mg Intravenous Q12H  . metoprolol tartrate  5 mg Intravenous Q6H   . dextrose 5 % and 0.45% NaCl 75 mL/hr at 08/02/17 2342  . famotidine (PEPCID) IV Stopped (08/03/17 0859)  . methocarbamol (ROBAXIN)  IV Stopped (08/03/17 0532)   Anti-infectives (From admission, onward)   Start     Dose/Rate Route Frequency Ordered Stop   08/02/17 1800  vancomycin (VANCOCIN) 1,250 mg in sodium chloride 0.9 % 250 mL IVPB  Status:  Discontinued     1,250 mg  166.7 mL/hr over 90 Minutes Intravenous Every 24 hours 08/01/17 1717 08/03/17 0955   08/01/17 1800  vancomycin (VANCOCIN) 2,000 mg in sodium chloride 0.9 % 500 mL IVPB     2,000 mg 250 mL/hr over 120 Minutes Intravenous  Once 08/01/17 1717 08/01/17 2045   08/01/17 1800  meropenem (MERREM) 1 g in sodium chloride 0.9 % 100 mL IVPB  Status:  Discontinued     1 g 200 mL/hr over 30 Minutes Intravenous Every 8 hours 08/01/17 1722 08/03/17 0955   07/30/17 0600  ciprofloxacin (CIPRO) IVPB 400 mg     400 mg 200 mL/hr over 60 Minutes Intravenous  Once 07/29/17 1247 07/30/17  1242   07/30/17 0600  metroNIDAZOLE (FLAGYL) IVPB 500 mg     500 mg 100 mL/hr over 60 Minutes Intravenous  Once 07/29/17 1247 07/30/17 1240     Assessment/Plan HTN HLD Hx of diverticulitis T2DM- SSI Chronic venous insufficiency LLE Cellulitis Morbid obesity  Readmission for SBO likely 2/2 large ventral hernia             - s/p ex-lap with extensive LOA, partial omentectomy and repair of ventral hernia with mesh - 07/30/2017 - Toth  - POD#4 (admited 07/27/17) HD 7 - drain with 140 cc out SS - continue drain  - mobilize as tolerated, encourage IS - will put on scheduled tylenol IV and robaxin to achieve better pain control and try to minimize narcotic need - clamping trial with ice chips - check KUB in AM Leukocytosis - WBC 21.5, Tmax 100.2 - potentially related to steroids and pseudogout? Acute encephalopathy -seems better this a.m. FEN:NPO, IVF; continue to have the NG clamped and start her on some sips of clears from the floor.   VTE: SCD's -she can have chemical anticoagulation for DVT prophylaxis from our standpoint RC:BULAG/TXMIWO periop; vanc/merrem 5/12-5/14/19 Foley:none Follow up:Toth   Plan: I will leave the NG in and clamped.  And start her on some sips of clears from the floor.  If she has any nausea we can resume suction.    She will need physical therapy and mobilization.    Recheck labs in the a.m.   LOS: 7 days   Debra [name],Debra [name] 08/03/2017 [phone]  Agree with above. She looks better.  She got out of bed today. Her husband is in the room with her.  He did no say a word.  Alphonsa Overall, MD, [facility] Pager: [phone] Office phone:  [phone].

## 2017-08-03 NOTE — Progress Notes (Signed)
Met with pt and husband at bedside due to therapy recommendation for SNF. Pt and husband report they are not open to SNF at this point. Husband explains he is home with pt 24/7 "and will help her with whatever she needs." Pt explains "my mother was in SNF and I feel like I could get better therapy at home." Is open to home health if appropriate and covered by her insurance.  CM following as well.  Sharren Bridge, MSW, LCSW Clinical Social Work 08/03/2017 [phone]

## 2017-08-03 NOTE — Progress Notes (Signed)
[facility] PROGRESS NOTE    Progress Note  Debra [name]  WCH:[id number] DOB: Dec 03, 1945 DOA: 07/27/2017 PCP: Vicenta Aly, FNP     Brief Narrative:   Debra [name] is an 72 y.o. female past medical history of diabetes, essential hypertension, morbid obesity recently admitted and discharged on 07/25/2017 for small bowel obstruction with ventral large hernia treated conservatively comes in again with nausea vomiting and no bowel movements for over 24 hours, found to have a small bowel obstruction.  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 Continue to monitor and replete electrolytes closely. Further management per surgery.  New acute encephalopathy: She has remained afebrile but has a leukocytosis, which is likely due to steroids. CT scan shows no signs of infection. Started on Haldol IV as needed. This seems to be more acute confusional state in the setting of surgical intervention and narcotic use.  Leukocytosis: She has remained afebrile, chest x-ray showed mild interstitial infiltrates question atelectasis She was started empirically on IV vancomycin and meropenem.  ET scan shows no acute findings Will DC empiric antibiotics. Her data has remained negative. Started empirically on IV steroids for pseudogout, which is likely contributing to her leukocytosis.  Type 2 diabetes, uncontrolled, with neuropathy ([facility]) Continues to be n.p.o. for rate control with NPH and sliding scale continue current management.  Essential hypertension Blood pressure seems to be well controlled continue IV metoprolol as needed.  GERD (gastroesophageal reflux disease) Continue Pepcid IV.  Severe obesity (BMI >= 40) (HCC) Counseling.  Hyperlipidemia associated with type 2 diabetes mellitus ([facility]) Hold statins, until she is able to tolerate  diet  Pseudogout: Due to her nature of being n.p.o. she was started empirically on IV steroids, she relates her knee pain is almost gone, will continue this for 2 additional days.  DVT prophylaxis: lovenox  Family Communication:none Disposition Plan/Barrier to D/C: Probably skilled nursing facility to 3 days. Code Status:     Code Status Orders  (From admission, onward)        Start     Ordered   07/27/17 1247  Full code  Continuous     07/27/17 1247    Code Status History    Date Active Date Inactive Code Status Order ID Comments User Context   07/22/2017 1738 07/25/2017 1355 Full Code [id number]  Cristal Ford, DO ED   04/14/2013 1451 04/19/2013 1346 Full Code [id number]  Fanny Skates, MD Inpatient   04/13/2013 1054 04/14/2013 1451 Full Code [id number]  Earnstine Regal, PA-C ED        IV Access:    Peripheral IV   Procedures and diagnostic studies:   Ct Abdomen Pelvis W Contrast  Result Date: 08/02/2017 CLINICAL DATA:  Acute onset of nausea and vomiting. Recently diagnosed with small bowel obstruction. EXAM: CT ABDOMEN AND PELVIS WITH CONTRAST TECHNIQUE: Multidetector CT imaging of the abdomen and pelvis was performed using the standard protocol following bolus administration of intravenous contrast. CONTRAST:  177mL ISOVUE-300 IOPAMIDOL (ISOVUE-300) INJECTION 61% COMPARISON:  CT of the abdomen and pelvis from 07/22/2017 FINDINGS: Lower chest: Mild bibasilar atelectasis or scarring is noted. The visualized portions of the mediastinum are unremarkable. Hepatobiliary: Pneumobilia is noted, likely reflecting prior instrumentation at the duodenal ampulla. The liver is otherwise unremarkable appearance. The patient is status post cholecystectomy. The common bile duct is grossly unremarkable in appearance. Pancreas: The pancreas is within normal limits.  Spleen: The spleen is unremarkable in appearance. Adrenals/Urinary Tract: The adrenal glands are unremarkable in appearance. Mild  nonspecific perinephric stranding is noted bilaterally. There is no evidence of hydronephrosis. No renal or ureteral stones are identified. Stomach/Bowel: The stomach is unremarkable in appearance. The small bowel is within normal limits. The patient is status post appendectomy. Minimal wall thickening along the transverse colon is likely reactive in nature. Scattered diverticulosis is noted along the descending and sigmoid colon, without evidence of diverticulitis. Vascular/Lymphatic: Scattered calcification is seen along the abdominal aorta and its branches. The abdominal aorta is otherwise grossly unremarkable. The inferior vena cava is grossly unremarkable. No retroperitoneal lymphadenopathy is seen. No pelvic sidewall lymphadenopathy is identified. Reproductive: The bladder is decompressed, with a Foley catheter in place. The patient is status post hysterectomy. A mildly prominent 3.9 cm left adnexal cystic focus is noted, only minimally changed from 2017 and likely benign. Other: Postoperative inflammation and scattered soft tissue air is noted along the anterior abdominal wall, with diffuse omental inflammation. Soft tissue inflammation tracks particularly about small bowel loops at the right mid abdomen. A drainage catheter is seen underlying the surgical site, without a significant fluid collection. Musculoskeletal: No acute osseous abnormalities are identified. The visualized musculature is unremarkable in appearance. IMPRESSION: 1. Postoperative inflammation and soft tissue air along the anterior abdominal wall, with diffuse omental inflammation. Soft tissue inflammation tracks particularly about small bowel loops at the right mid abdomen, without evidence of bowel perforation at this time. 2. Drainage catheter underlying the surgical site, without a significant fluid collection. 3. Minimal wall thickening along the transverse colon is likely reactive in nature. 4. Scattered diverticulosis along the  descending and sigmoid colon, without evidence of diverticulitis. 5. Mildly prominent 3.9 cm left adnexal cystic focus is relatively stable from prior studies and likely benign. 6. Pneumobilia likely reflects prior instrumentation at the duodenal ampulla. Aortic Atherosclerosis (ICD10-I70.0). Electronically Signed   By: Garald Balding M.D.   On: 08/02/2017 00:37   Dg Chest Port 1 View  Result Date: 08/01/2017 CLINICAL DATA:  Tachycardia.  Ventral hernia repair on 07/30/2017. EXAM: PORTABLE CHEST 1 VIEW COMPARISON:  07/27/2017 FINDINGS: Cardiomediastinal silhouette is unchanged. An NG tube is identified entering the stomach with tip off the field of view. This is a low volume film with scattered areas of subsegmental atelectasis within bilateral mid and lower lungs. There is no evidence of pneumothorax or definite pleural effusion. Mild pulmonary vascular congestion is present. IMPRESSION: Scattered subsegmental atelectasis within bilateral mid and lower lungs. Mild pulmonary vascular congestion. Electronically Signed   By: Margarette Canada M.D.   On: 08/01/2017 17:50     Medical Consultants:    None.  Anti-Infectives:   None  Subjective:    [name] t she relates her pain is controlled she still feels awful, but is in a better attitude is not confused.  Objective:    Vitals:   08/02/17 1406 08/02/17 1942 08/02/17 2350 08/03/17 0401  BP: 134/69 (!) 157/80 (!) 156/80 (!) 141/79  Pulse: 80 80 83 75  Resp: 18 17 18 18   Temp: 97.9 F (36.6 C) 97.8 F (36.6 C) 97.8 F (36.6 C) 98.3 F (36.8 C)  TempSrc: Oral Oral Oral Oral  SpO2: 93% 94% 92% 94%  Weight:      Height:        Intake/Output Summary (Last 24 hours) at 08/03/2017 0950 Last data filed at 08/03/2017 0931 Gross per 24 hour  Intake 3000.83 ml  Output  1370 ml  Net 1630.83 ml   Filed Weights   07/27/17 1242  Weight: 120.7 kg (266 lb)    Exam: General exam: In no acute distress, NG tube in place Respiratory  system: Good air movement and clear to auscultation. Cardiovascular system: S1 & S2 heard, RRR.   Gastrointestinal system: Deferred due to recent surgical intervention. Central nervous system: Alert and oriented. No focal neurological deficits. Extremities: No pedal edema. Skin: No rashes, lesions or ulcers   Data Reviewed:    Labs: Basic Metabolic Panel: Recent Labs  Lab 07/27/17 1039  07/29/17 0531 07/30/17 0545 08/01/17 1555 08/02/17 0722 08/03/17 0425  NA 138   < > 140 140 136 136 140  K 4.3   < > 3.6 3.7 4.2 4.0 4.0  CL 104   < > 108 106 99* 101 105  CO2 22   < > 22 24 26 24 25   GLUCOSE 296*   < > 87 179* 185* 202* 196*  BUN 15   < > 12 6 10 12 15   CREATININE 0.88   < > 0.74 0.67 1.08* 0.97 0.88  CALCIUM 9.0   < > 7.9* 7.8* 8.1* 8.1* 8.2*  MG 1.3*  --  1.6*  --   --  1.2*  --    < > = values in this interval not displayed.   GFR Estimated Creatinine Clearance: 76.2 mL/min (by C-G formula based on SCr of 0.88 mg/dL). Liver Function Tests: Recent Labs  Lab 07/27/17 1039 07/28/17 0612 08/02/17 0722  AST 37 22 19  ALT 34 28 21  ALKPHOS 83 66 73  BILITOT 1.4* 0.9 1.0  PROT 7.8 6.6 6.2*  ALBUMIN 3.8 3.1* 2.4*   Recent Labs  Lab 07/27/17 1039  LIPASE 21   No results for input(s): AMMONIA in the last 168 hours. Coagulation profile No results for input(s): INR, PROTIME in the last 168 hours.  CBC: Recent Labs  Lab 07/27/17 1039  07/29/17 0531 07/30/17 0545 08/01/17 1555 08/01/17 1819 08/02/17 0722  WBC 8.2   < > 6.1 5.4 19.0* 17.5* 21.5*  NEUTROABS 5.6  --   --   --  15.2* 14.4* 18.4*  HGB 15.1*   < > 13.4 12.2 13.4 13.3 12.8  HCT 44.3   < > 41.5 36.8 40.5 39.7 38.2  MCV 90.8   < > 92.8 91.5 91.4 90.8 90.5  PLT 270   < > 230 248 320 291 272   < > = values in this interval not displayed.   Cardiac Enzymes: Recent Labs  Lab 08/01/17 1819 08/02/17 0015 08/02/17 0722  TROPONINI 0.04* 0.03* 0.03*   BNP (last 3 results) No results for input(s):  PROBNP in the last 8760 hours. CBG: Recent Labs  Lab 08/02/17 1630 08/02/17 1940 08/02/17 2344 08/03/17 0400 08/03/17 0750  GLUCAP 232* 191* 194* 179* 214*   D-Dimer: No results for input(s): DDIMER in the last 72 hours. Hgb A1c: No results for input(s): HGBA1C in the last 72 hours. Lipid Profile: No results for input(s): CHOL, HDL, LDLCALC, TRIG, CHOLHDL, LDLDIRECT in the last 72 hours. Thyroid function studies: No results for input(s): TSH, T4TOTAL, T3FREE, THYROIDAB in the last 72 hours.  Invalid input(s): FREET3 Anemia work up: No results for input(s): VITAMINB12, FOLATE, FERRITIN, TIBC, IRON, RETICCTPCT in the last 72 hours. Sepsis Labs: Recent Labs  Lab 07/30/17 0545 08/01/17 1555 08/01/17 1819 08/02/17 0722  WBC 5.4 19.0* 17.5* 21.5*   Microbiology Recent Results (from the past  240 hour(s))  Surgical pcr screen     Status: None   Collection Time: 07/30/17  5:33 AM  Result Value Ref Range Status   MRSA, PCR NEGATIVE NEGATIVE Final   Staphylococcus aureus NEGATIVE NEGATIVE Final    Comment: (NOTE) The Xpert SA Assay (FDA approved for NASAL specimens in patients 27 years of age and older), is one component of a comprehensive surveillance program. It is not intended to diagnose infection nor to guide or monitor treatment. Performed at [facility], [place] [address], [place], [place] [address]   Culture, blood (Routine X 2) w Reflex to ID Panel     Status: None (Preliminary result)   Collection Time: 08/01/17  6:19 PM  Result Value Ref Range Status   Specimen Description BLOOD RIGHT ANTECUBITAL  Final   Special Requests   Final    BOTTLES DRAWN AEROBIC AND ANAEROBIC Blood Culture adequate volume Performed at [facility], [place] [address] [place], [place] [address]    Culture PENDING  Incomplete   Report Status PENDING  Incomplete  Culture, blood (Routine X 2) w Reflex to ID Panel     Status: None (Preliminary result)   Collection  Time: 08/01/17  6:19 PM  Result Value Ref Range Status   Specimen Description BLOOD RIGHT HAND  Final   Special Requests   Final    BOTTLES DRAWN AEROBIC AND ANAEROBIC Blood Culture adequate volume Performed at [facility], [place] [address], [place], [place] [address]    Culture PENDING  Incomplete   Report Status PENDING  Incomplete     Medications:   . insulin aspart  0-15 Units Subcutaneous Q4H  . insulin NPH Human  17 Units Subcutaneous BID AC & HS  . methylPREDNISolone (SOLU-MEDROL) injection  25 mg Intravenous Q12H  . metoprolol tartrate  5 mg Intravenous Q6H   Continuous Infusions: . dextrose 5 % and 0.45% NaCl 75 mL/hr at 08/02/17 2342  . famotidine (PEPCID) IV Stopped (08/03/17 0859)  . meropenem (MERREM) IV 1 g (08/03/17 0928)  . methocarbamol (ROBAXIN)  IV Stopped (08/03/17 0532)  . vancomycin Stopped (08/02/17 1953)      LOS: 7 days   Charlynne Cousins  [facility] Pager [phone]  *Please refer to [email or web address], password [email or web address] to get updated schedule on who will round on this patient, as hospitalists switch teams weekly. If 7PM-7AM, please contact night-coverage at [email or web address], password [email or web address] for any overnight needs.  08/03/2017, 9:50 AM

## 2017-08-04 DIAGNOSIS — E1140 Type 2 diabetes mellitus with diabetic neuropathy, unspecified: Secondary | ICD-10-CM

## 2017-08-04 DIAGNOSIS — E1165 Type 2 diabetes mellitus with hyperglycemia: Secondary | ICD-10-CM

## 2017-08-04 LAB — BASIC METABOLIC PANEL
Anion gap: 11 (ref 5–15)
BUN: 16 mg/dL (ref 6–20)
CO2: 25 mmol/L (ref 22–32)
CREATININE: 0.85 mg/dL (ref 0.44–1.00)
Calcium: 8.3 mg/dL — ABNORMAL LOW (ref 8.9–10.3)
Chloride: 104 mmol/L (ref 101–111)
GFR calc non Af Amer: >60 mL/min (ref >60)
Glucose, Bld: 192 mg/dL — ABNORMAL HIGH (ref 65–99)
Potassium: 3.7 mmol/L (ref 3.5–5.1)
Sodium: 140 mmol/L (ref 135–145)

## 2017-08-04 LAB — GLUCOSE, CAPILLARY
GLUCOSE-CAPILLARY: 162 mg/dL — AB (ref 65–99)
GLUCOSE-CAPILLARY: 101 mg/dL — AB (ref 65–99)
Glucose-Capillary: 138 mg/dL — ABNORMAL HIGH (ref 65–99)
Glucose-Capillary: 176 mg/dL — ABNORMAL HIGH (ref 65–99)
Glucose-Capillary: 186 mg/dL — ABNORMAL HIGH (ref 65–99)
Glucose-Capillary: 252 mg/dL — ABNORMAL HIGH (ref 65–99)

## 2017-08-04 LAB — CBC
HCT: 36 % (ref 36.0–46.0)
Hemoglobin: 12.1 g/dL (ref 12.0–15.0)
MCH: 30.5 pg (ref 26.0–34.0)
MCHC: 33.6 g/dL (ref 30.0–36.0)
MCV: 90.7 fL (ref 78.0–100.0)
PLATELETS: 350 10*3/uL (ref 150–400)
RBC: 3.97 MIL/uL (ref 3.87–5.11)
RDW: 14 % (ref 11.5–15.5)
WBC: 11.5 10*3/uL — ABNORMAL HIGH (ref 4.0–10.5)

## 2017-08-04 MED ORDER — ENOXAPARIN SODIUM 60 MG/0.6ML ~~LOC~~ SOLN: 0.5000 mg/kg | SUBCUTANEOUS | Status: DC

## 2017-08-04 MED ORDER — METOPROLOL SUCCINATE ER 50 MG PO TB24
50.0000 mg | ORAL_TABLET | Freq: Every day | ORAL | Status: DC
  Administered 2017-08-04 – 2017-08-05 (×2): 50 mg via ORAL
  Filled 2017-08-04 (×2): qty 1

## 2017-08-04 MED ORDER — METHYLPREDNISOLONE SODIUM SUCC 40 MG IJ SOLR
25.0000 mg | Freq: Two times a day (BID) | INTRAMUSCULAR | Status: AC
  Administered 2017-08-04: 25 mg via INTRAVENOUS
  Filled 2017-08-04: qty 1

## 2017-08-04 MED ORDER — PANTOPRAZOLE SODIUM 40 MG PO TBEC
40.0000 mg | DELAYED_RELEASE_TABLET | Freq: Every day | ORAL | Status: DC
  Administered 2017-08-04 – 2017-08-05 (×2): 40 mg via ORAL
  Filled 2017-08-04 (×2): qty 1

## 2017-08-04 MED ORDER — METHOCARBAMOL 500 MG PO TABS: 500.0000 mg | ORAL_TABLET | Freq: Three times a day (TID) | ORAL | Status: DC | PRN

## 2017-08-04 MED ORDER — PRO-STAT SUGAR FREE PO LIQD
30.0000 mL | Freq: Two times a day (BID) | ORAL | Status: DC
  Administered 2017-08-04: 30 mL via ORAL
  Filled 2017-08-04: qty 30

## 2017-08-04 NOTE — Progress Notes (Addendum)
PROGRESS NOTE    Debra [name]   XTK:[id number]  DOB: [birth year]  DOA: 07/27/2017 PCP: Vicenta Aly, FNP   Brief Narrative:  Debra [name] is a 72 y/o with DM2 with neuropathy, GERD, HTN, obesity, large ventral abdominal hernia who was admitted from 5/2- 5/5/ for SBO that resolved with conservative management. She presents again for vomiting and is found to have a recurrent SBO.    Subjective: Tolerating liquid diet.     Assessment & Plan:   Principal Problem:   SBO (small bowel obstruction) - per general surgery - underwent hernia repair with mesh, lysis of adhesions and partial omentectomy on 5/10  Active Problems:   Type 2 diabetes, uncontrolled, with neuropathy  - on 98 U of NPH for breakfast and 76 U before dinner - currently on 20 BID with SSI  Pseudogout - of right  knee-  - improving - can likely stop steroids after tonight's dose    Essential hypertension - on Toprol 50 mg daily    GERD (gastroesophageal reflux disease) - on daily PPI    Severe obesity (BMI >= 40) (HCC) Body mass index is 45.66 kg/m.      DVT prophylaxis: lovenox Code Status: Full code Family Communication:  Disposition Plan: home when OK with sugery Consultants:   gen surgery Procedures:   5/10 HERNIA REPAIR VENTRAL ADULT WITH MESH EXPLORATORY LAPAROTOMY (N/A) EXTENSIVE LYSIS OF ADHESION (N/A) PARTIAL OMENTECTOMY (N/A)    Antimicrobials:  Anti-infectives (From admission, onward)   Start     Dose/Rate Route Frequency Ordered Stop   08/02/17 1800  vancomycin (VANCOCIN) 1,250 mg in sodium chloride 0.9 % 250 mL IVPB  Status:  Discontinued     1,250 mg 166.7 mL/hr over 90 Minutes Intravenous Every 24 hours 08/01/17 1717 08/03/17 0955   08/01/17 1800  vancomycin (VANCOCIN) 2,000 mg in sodium chloride 0.9 % 500 mL IVPB     2,000 mg 250 mL/hr over 120 Minutes Intravenous  Once 08/01/17 1717 08/01/17 2045   08/01/17 1800  meropenem (MERREM) 1 g in sodium chloride 0.9 % 100  mL IVPB  Status:  Discontinued     1 g 200 mL/hr over 30 Minutes Intravenous Every 8 hours 08/01/17 1722 08/03/17 0955   07/30/17 0600  ciprofloxacin (CIPRO) IVPB 400 mg     400 mg 200 mL/hr over 60 Minutes Intravenous  Once 07/29/17 1247 07/30/17 1242   07/30/17 0600  metroNIDAZOLE (FLAGYL) IVPB 500 mg     500 mg 100 mL/hr over 60 Minutes Intravenous  Once 07/29/17 1247 07/30/17 1240       Objective: Vitals:   08/03/17 2039 08/03/17 2055 08/04/17 0410 08/04/17 1336  BP: (!) 165/82  (!) 158/75 (!) 157/97  Pulse: 93  82 92  Resp: 20  20 18   Temp: 97.8 F (36.6 C)  98.2 F (36.8 C) 98.6 F (37 C)  TempSrc: Oral  Oral Oral  SpO2: 90% 91% 91% 94%  Weight:      Height:        Intake/Output Summary (Last 24 hours) at 08/04/2017 1431 Last data filed at 08/04/2017 1430 Gross per 24 hour  Intake 1021.25 ml  Output 980 ml  Net 41.25 ml   Filed Weights   07/27/17 1242  Weight: 120.7 kg (266 lb)    Examination: General exam: Appears comfortable  HEENT: PERRLA, oral mucosa moist, no sclera icterus or thrush Respiratory system: Clear to auscultation. Respiratory effort normal. Cardiovascular system: S1 & S2 heard, RRR.  Gastrointestinal system: Abdomen soft, , nondistended. Normal bowel sound. No organomegaly Central nervous system: Alert and oriented. No focal neurological deficits. Extremities: No cyanosis, clubbing or edema Skin: No rashes or ulcers Psychiatry:  Mood & affect appropriate.     Data Reviewed: I have personally reviewed following labs and imaging studies  CBC: Recent Labs  Lab 07/30/17 0545 08/01/17 1555 08/01/17 1819 08/02/17 0722 08/04/17 0418  WBC 5.4 19.0* 17.5* 21.5* 11.5*  NEUTROABS  --  15.2* 14.4* 18.4*  --   HGB 12.2 13.4 13.3 12.8 12.1  HCT 36.8 40.5 39.7 38.2 36.0  MCV 91.5 91.4 90.8 90.5 90.7  PLT 248 320 291 272 732   Basic Metabolic Panel: Recent Labs  Lab 07/29/17 0531 07/30/17 0545 08/01/17 1555 08/02/17 0722  08/03/17 0425 08/04/17 0418  NA 140 140 136 136 140 140  K 3.6 3.7 4.2 4.0 4.0 3.7  CL 108 106 99* 101 105 104  CO2 22 24 26 24 25 25   GLUCOSE 87 179* 185* 202* 196* 192*  BUN 12 6 10 12 15 16   CREATININE 0.74 0.67 1.08* 0.97 0.88 0.85  CALCIUM 7.9* 7.8* 8.1* 8.1* 8.2* 8.3*  MG 1.6*  --   --  1.2*  --   --    GFR: Estimated Creatinine Clearance: 78.8 mL/min (by C-G formula based on SCr of 0.85 mg/dL). Liver Function Tests: Recent Labs  Lab 08/02/17 0722  AST 19  ALT 21  ALKPHOS 73  BILITOT 1.0  PROT 6.2*  ALBUMIN 2.4*   No results for input(s): LIPASE, AMYLASE in the last 168 hours. No results for input(s): AMMONIA in the last 168 hours. Coagulation Profile: No results for input(s): INR, PROTIME in the last 168 hours. Cardiac Enzymes: Recent Labs  Lab 08/01/17 1819 08/02/17 0015 08/02/17 0722  TROPONINI 0.04* 0.03* 0.03*   BNP (last 3 results) No results for input(s): PROBNP in the last 8760 hours. HbA1C: No results for input(s): HGBA1C in the last 72 hours. CBG: Recent Labs  Lab 08/03/17 2040 08/04/17 0031 08/04/17 0412 08/04/17 0727 08/04/17 1217  GLUCAP 185* 186* 176* 162* 252*   Lipid Profile: No results for input(s): CHOL, HDL, LDLCALC, TRIG, CHOLHDL, LDLDIRECT in the last 72 hours. Thyroid Function Tests: No results for input(s): TSH, T4TOTAL, FREET4, T3FREE, THYROIDAB in the last 72 hours. Anemia Panel: No results for input(s): VITAMINB12, FOLATE, FERRITIN, TIBC, IRON, RETICCTPCT in the last 72 hours. Urine analysis:    Component Value Date/Time   COLORURINE AMBER (A) 07/27/2017 1518   APPEARANCEUR CLOUDY (A) 07/27/2017 1518   LABSPEC 1.032 (H) 07/27/2017 1518   PHURINE 5.0 07/27/2017 1518   GLUCOSEU 50 (A) 07/27/2017 1518   GLUCOSEU NEG mg/dL 04/13/2007 0230   HGBUR NEGATIVE 07/27/2017 1518   BILIRUBINUR SMALL (A) 07/27/2017 1518   BILIRUBINUR Neg 01/01/2015 1348   KETONESUR 5 (A) 07/27/2017 1518   PROTEINUR 100 (A) 07/27/2017 1518    UROBILINOGEN 2.0 01/01/2015 1348   UROBILINOGEN 0.2 04/13/2013 0714   NITRITE NEGATIVE 07/27/2017 1518   LEUKOCYTESUR TRACE (A) 07/27/2017 1518   Sepsis Labs: @LABRCNTIP (procalcitonin:4,lacticidven:4) ) Recent Results (from the past 240 hour(s))  Surgical pcr screen     Status: None   Collection Time: 07/30/17  5:33 AM  Result Value Ref Range Status   MRSA, PCR NEGATIVE NEGATIVE Final   Staphylococcus aureus NEGATIVE NEGATIVE Final    Comment: (NOTE) The Xpert SA Assay (FDA approved for NASAL specimens in patients 63 years of age and older), is one component  of a comprehensive surveillance program. It is not intended to diagnose infection nor to guide or monitor treatment. Performed at [facility], [place] [address] [place], [place] [address]   Culture, blood (Routine X 2) w Reflex to ID Panel     Status: None (Preliminary result)   Collection Time: 08/01/17  6:19 PM  Result Value Ref Range Status   Specimen Description BLOOD RIGHT ANTECUBITAL  Final   Special Requests   Final    BOTTLES DRAWN AEROBIC AND ANAEROBIC Blood Culture adequate volume   Culture   Final    NO GROWTH 2 DAYS Performed at [facility], [place] [address], [place], [place] [address]    Report Status PENDING  Incomplete  Culture, blood (Routine X 2) w Reflex to ID Panel     Status: None (Preliminary result)   Collection Time: 08/01/17  6:19 PM  Result Value Ref Range Status   Specimen Description BLOOD RIGHT HAND  Final   Special Requests   Final    BOTTLES DRAWN AEROBIC AND ANAEROBIC Blood Culture adequate volume   Culture   Final    NO GROWTH 2 DAYS Performed at [facility], [place] [address] [place], [place] [address]    Report Status PENDING  Incomplete         Radiology Studies: No results found.    Scheduled Meds: . enoxaparin (LOVENOX) injection  40 mg Subcutaneous Q24H  . feeding supplement (PRO-STAT SUGAR FREE 64)  30 mL Oral BID  . insulin aspart  0-15  Units Subcutaneous Q4H  . insulin NPH Human  20 Units Subcutaneous BID AC & HS  . methylPREDNISolone (SOLU-MEDROL) injection  25 mg Intravenous Q12H  . metoprolol succinate  50 mg Oral Daily  . pantoprazole  40 mg Oral Daily   Continuous Infusions: . dextrose 5 % and 0.45% NaCl 10 mL/hr at 08/04/17 1054  . methocarbamol (ROBAXIN)  IV 500 mg (08/04/17 1422)     LOS: 8 days    Time spent in minutes: 35    Debbe Odea, MD [facility] Pager: [email or web address] Password [facility] 08/04/2017, 2:31 PM

## 2017-08-04 NOTE — Progress Notes (Addendum)
[facility] Progress Note  5 Days Post-Op  Subjective: CC: wants NGT out Patient having BMs and tolerated CLD with NGT clamped. Denies abdominal pain. Wanting to go home with home therapies over SNF.   Objective: Vital signs in last 24 hours: Temp:  [97.8 F (36.6 C)-98.2 F (36.8 C)] 98.2 F (36.8 C) (05/15 0410) Pulse Rate:  [78-93] 82 (05/15 0410) Resp:  [20] 20 (05/15 0410) BP: (157-165)/(69-82) 158/75 (05/15 0410) SpO2:  [90 %-91 %] 91 % (05/15 0410) Last BM Date: 07/25/17  Intake/Output from previous day: 05/14 0701 - 05/15 0700 In: 781.3 [I.V.:676.3; IV Piggyback:105] Out: 1310 [Urine:1200; Emesis/NG output:50; Drains:60] Intake/Output this shift: No intake/output data recorded.  PE: Gen:  Alert, NAD, pleasant Card:  Regular rate and rhythm Pulm:  Normal effort, clear to auscultation bilaterally Abd: Soft, non-tender, non-distended, bowel sounds present, incision C/D/I with staples, drain with SS drainage present Skin: warm and dry, no rashes  Psych: A&Ox3   Lab Results:  Recent Labs    08/02/17 0722 08/04/17 0418  WBC 21.5* 11.5*  HGB 12.8 12.1  HCT 38.2 36.0  PLT 272 350   BMET Recent Labs    08/03/17 0425 08/04/17 0418  NA 140 140  K 4.0 3.7  CL 105 104  CO2 25 25  GLUCOSE 196* 192*  BUN 15 16  CREATININE 0.88 0.85  CALCIUM 8.2* 8.3*   PT/INR No results for input(s): LABPROT, INR in the last 72 hours. CMP     Component Value Date/Time   NA 140 08/04/2017 0418   NA 143 07/08/2015 1023   K 3.7 08/04/2017 0418   CL 104 08/04/2017 0418   CO2 25 08/04/2017 0418   GLUCOSE 192 (H) 08/04/2017 0418   BUN 16 08/04/2017 0418   BUN 21 07/08/2015 1023   CREATININE 0.85 08/04/2017 0418   CREATININE 0.86 05/22/2016 0903   CALCIUM 8.3 (L) 08/04/2017 0418   PROT 6.2 (L) 08/02/2017 0722   PROT 6.5 10/03/2013 0920   ALBUMIN 2.4 (L) 08/02/2017 0722   ALBUMIN 4.3 10/03/2013 0920   AST 19 08/02/2017 0722   ALT 21 08/02/2017 0722   ALKPHOS 73 08/02/2017 0722   BILITOT 1.0 08/02/2017 0722   GFRNONAA >60 08/04/2017 0418   GFRNONAA 69 05/22/2016 0903   GFRAA >60 08/04/2017 0418   GFRAA 80 05/22/2016 0903   Lipase     Component Value Date/Time   LIPASE 21 07/27/2017 1039       Studies/Results: No results found.  Anti-infectives: Anti-infectives (From admission, onward)   Start     Dose/Rate Route Frequency Ordered Stop   08/02/17 1800  vancomycin (VANCOCIN) 1,250 mg in sodium chloride 0.9 % 250 mL IVPB  Status:  Discontinued     1,250 mg 166.7 mL/hr over 90 Minutes Intravenous Every 24 hours 08/01/17 1717 08/03/17 0955   08/01/17 1800  vancomycin (VANCOCIN) 2,000 mg in sodium chloride 0.9 % 500 mL IVPB     2,000 mg 250 mL/hr over 120 Minutes Intravenous  Once 08/01/17 1717 08/01/17 2045   08/01/17 1800  meropenem (MERREM) 1 g in sodium chloride 0.9 % 100 mL IVPB  Status:  Discontinued     1 g 200 mL/hr over 30 Minutes Intravenous Every 8 hours 08/01/17 1722 08/03/17 0955   07/30/17 0600  ciprofloxacin (CIPRO) IVPB 400 mg     400 mg 200 mL/hr over 60 Minutes Intravenous  Once 07/29/17 1247 07/30/17 1242   07/30/17 0600  metroNIDAZOLE (FLAGYL) IVPB 500 mg  500 mg 100 mL/hr over 60 Minutes Intravenous  Once 07/29/17 1247 07/30/17 1240       Assessment/Plan HTN HLD Hx of diverticulitis T2DM-SSI Chronic venous insufficiency Morbid obesity  Readmission for SBO likely 2/2 large ventral hernia - s/p ex-lap with extensive LOA, partial omentectomy and repair of ventral hernia with mesh- 07/30/2017 - [facility]             - POD#5 (admited 07/27/17) HD 7 - drain with 60 cc outSS- continue drain  - mobilize as tolerated, encourage IS - d/c'd NGT and start FLD  Leukocytosis- improved Acute encephalopathy- resolved  FEN:FLD, KVO VTE: SCD's, lovenox JS:RPRXY/VOPFYT periop; vanc/merrem 5/12-5/14/19 Foley:none Follow up:[facility]   Plan: Removed NGT and advance to FLD. Patient may  shower. Continue drain.   Steroids per medicine, have converted most of her other medications to PO.   Hopefully home with home health in the next 2 days.    LOS: 8 days    Brigid Re , [facility] 08/04/2017, 10:52 AM Pager: [phone]  Agree with above.  Alphonsa Overall, MD, [facility] Pager: [phone] Office phone:  [phone]

## 2017-08-04 NOTE — Progress Notes (Signed)
Pt states she is going home with HH. Pt selected [facility], referral given to in house rep.

## 2017-08-04 NOTE — Progress Notes (Signed)
Initial Nutrition Assessment  DOCUMENTATION CODES:   Morbid obesity  INTERVENTION:    30 ml Prostat BID, each supplement provides 100 kcals and 15 grams protein.   Monitor for diet advancement/toleration  NUTRITION DIAGNOSIS:   Inadequate oral intake related to vomiting, nausea as evidenced by per patient/family report.  GOAL:   Patient will meet greater than or equal to 90% of their needs  MONITOR:   PO intake, Supplement acceptance, Diet advancement, Weight trends, Labs  REASON FOR ASSESSMENT:   Consult Assessment of nutrition requirement/status  ASSESSMENT:   Patient with PMH significant for DM, HTN, and diverticulitis. Recently admitted 5/2-5/5 with complaints of nausea/vomiting. CT showed ventral hernia in the pelvis containing mucous of large/ small bowel obstructionl. NG tube was placed.patient treated conservatively. Presents this admission with nausea vomiting that started almost immediately after her discharge.    Spoke with pt at bedside. Reports appetite has remained poor since the end of April due to ongoing nausea/vomiting. States she would typically eat lunch and dinner but had to force herself. She does not use supplementation at home. Diet has been advanced to full liquids. NGT clamped yesterday and removed today. Pt denies nausea/vomiting. She is amendable to Prostat but does not like Ensure/Boost.   Pt endorses a UBW of 260 lb and denies any recent wt loss. Records are limited in actual weight readings as they all look to be stated. Do not suspect recent wt loss. Nutrition-Focused physical exam completed.   Medications reviewed and include: solumedrol, D5 @ 10 ml/hr Labs reviewed.   NUTRITION - FOCUSED PHYSICAL EXAM:    Most Recent Value  Orbital Region  No depletion  Upper Arm Region  No depletion  Thoracic and Lumbar Region  No depletion  Buccal Region  No depletion  Temple Region  No depletion  Clavicle Bone Region  No depletion  Clavicle and  Acromion Bone Region  No depletion  Scapular Bone Region  No depletion  Dorsal Hand  No depletion  Patellar Region  No depletion  Anterior Thigh Region  No depletion  Posterior Calf Region  No depletion  Edema (RD Assessment)  Unable to assess     Diet Order:   Diet Order           Diet full liquid Room service appropriate? Yes; Fluid consistency: Thin  Diet effective now          EDUCATION NEEDS:   Education needs have been addressed  Skin:  Skin Assessment: Reviewed RN Assessment  Last BM:  07/25/17  Height:   Ht Readings from Last 1 Encounters:  07/27/17 5\' 4"  (1.626 m)    Weight:   Wt Readings from Last 1 Encounters:  07/27/17 266 lb (120.7 kg)    Ideal Body Weight:  54.5 kg  BMI:  Body mass index is 45.66 kg/m.  Estimated Nutritional Needs:   Kcal:  1700-1900 kcal  Protein:  85-95 g  Fluid:  >1.7 L/day    Mariana Single RD, LDN Clinical Nutrition Pager # [phone]

## 2017-08-04 NOTE — Progress Notes (Signed)
Physical Therapy Treatment Patient Details Name: Debra [name] MRN: [id number] DOB: 1945-10-15 Today's Date: 08/04/2017    History of Present Illness 72 y.o. female past medical history of diabetes, essential hypertension, morbid obesity recently admitted and discharged on 07/25/2017 for small bowel obstruction with ventral large hernia treated conservatively.  Pt presented to ED 07/27/17 and found to have a small bowel obstruction and s/p ventral hernia repair 07/30/17.  Pt now with new acute encephalopathy     PT Comments    Pt assisted off BSC and to recliner for short rest break.  Pt feeling lousy today however agreeable to ambulate in room with encouragement.   Follow Up Recommendations  SNF(declines SNF per notes, HHPT)     Equipment Recommendations  Rolling walker with 5" wheels;3in1 (PT)    Recommendations for Other Services       Precautions / Restrictions Precautions Precautions: Fall Precaution Comments: abdominal binder    Mobility  Bed Mobility Overal bed mobility: Needs Assistance             General bed mobility comments: pt up in recliner on arrival transferring to [facility] with NT  Transfers Overall transfer level: Needs assistance Equipment used: Rolling walker (2 wheeled) Transfers: Sit to/from Stand Sit to Stand: Min assist         General transfer comment: slight assist for rise and controlled descent, dependent on UEs for self assist, increased time and effort  Ambulation/Gait Ambulation/Gait assistance: Min guard Ambulation Distance (Feet): 12 Feet Assistive device: Rolling walker (2 wheeled) Gait Pattern/deviations: Step-through pattern;Decreased stride length;Trunk flexed     General Gait Details: pt agreeable to ambulate in room with encouragement (not feeling well today so didn't want to go into hallway), increased trunk flexion due to pain, pt did not have abdominal binder (provided one too small, larger one ordered) so therapist kept pillow  to pt's incision   Stairs             Wheelchair Mobility    Modified Rankin (Stroke Patients Only)       Balance                                            Cognition Arousal/Alertness: Awake/alert Behavior During Therapy: WFL for tasks assessed/performed Overall Cognitive Status: Within Functional Limits for tasks assessed                                        Exercises      General Comments        Pertinent Vitals/Pain Pain Assessment: 0-10 Pain Score: 5  Pain Location: abdomen Pain Descriptors / Indicators: Sore;Guarding;Grimacing Pain Intervention(s): Limited activity within patient's tolerance;Repositioned;Monitored during session    Home Living                      Prior Function            PT Goals (current goals can now be found in the care plan section) Progress towards PT goals: Progressing toward goals    Frequency    Min 3X/week      PT Plan Current plan remains appropriate    Co-evaluation              AM-PAC PT "6 Clicks" Daily Activity  Outcome Measure  Difficulty turning over in bed (including adjusting bedclothes, sheets and blankets)?: A Lot Difficulty moving from lying on back to sitting on the side of the bed? : A Lot Difficulty sitting down on and standing up from a chair with arms (e.g., wheelchair, bedside commode, etc,.)?: A Lot Help needed moving to and from a bed to chair (including a wheelchair)?: A Little Help needed walking in hospital room?: A Little Help needed climbing 3-5 steps with a railing? : A Lot 6 Click Score: 14    End of Session   Activity Tolerance: Patient limited by fatigue Patient left: in chair;with call bell/phone within reach;with family/visitor present   PT Visit Diagnosis: Other abnormalities of gait and mobility (R26.89)     Time: 3545-6256 PT Time Calculation (min) (ACUTE ONLY): 16 min  Charges:  $Gait Training: 8-22 mins                     G Codes:       Debra [name], PT, DPT 08/04/2017 Pager: [phone]  York Ram E 08/04/2017, 3:04 PM

## 2017-08-05 DIAGNOSIS — M25461 Effusion, right knee: Secondary | ICD-10-CM

## 2017-08-05 LAB — GLUCOSE, CAPILLARY
GLUCOSE-CAPILLARY: 109 mg/dL — AB (ref 65–99)
GLUCOSE-CAPILLARY: 113 mg/dL — AB (ref 65–99)
GLUCOSE-CAPILLARY: 127 mg/dL — AB (ref 65–99)
Glucose-Capillary: 118 mg/dL — ABNORMAL HIGH (ref 65–99)

## 2017-08-05 MED ORDER — PRO-STAT SUGAR FREE PO LIQD: 30.0000 mL | Freq: Two times a day (BID) | ORAL | 0 refills | Status: DC

## 2017-08-05 MED ORDER — ACETAMINOPHEN 325 MG PO TABS: 650.0000 mg | ORAL_TABLET | Freq: Four times a day (QID) | ORAL | Status: DC | PRN

## 2017-08-05 MED ORDER — TRAMADOL HCL 50 MG PO TABS: 50.0000 mg | ORAL_TABLET | Freq: Four times a day (QID) | ORAL | Status: DC | PRN

## 2017-08-05 NOTE — Progress Notes (Signed)
Physical Therapy Treatment Patient Details Name: Debra [name] MRN: [id number] DOB: 07/13/1945 Today's Date: 08/05/2017    History of Present Illness 72 y.o. female past medical history of diabetes, essential hypertension, morbid obesity recently admitted and discharged on 07/25/2017 for small bowel obstruction with ventral large hernia treated conservatively.  Pt presented to ED 07/27/17 and found to have a small bowel obstruction and s/p ventral hernia repair 07/30/17.  Pt now with new acute encephalopathy     PT Comments    Pt reports feeling much better.  She reports she plans to eat solid diet and take a shower today after MD in this morning.  Pt agreeable to ambulate as she wishes to d/c home as soon as possible.  Mobility is slowly progressing.   Follow Up Recommendations  Supervision/Assistance - 24 hour;Home health PT     Equipment Recommendations  Rolling walker with 5" wheels;3in1 (PT)    Recommendations for Other Services       Precautions / Restrictions Precautions Precautions: Fall Precaution Comments: abdominal binder    Mobility  Bed Mobility               General bed mobility comments: pt up in recliner on arrival  Transfers Overall transfer level: Needs assistance Equipment used: Rolling walker (2 wheeled) Transfers: Sit to/from Stand Sit to Stand: Min guard         General transfer comment: dependent on UEs for self assist, increased time and effort, min/guard for safety  Ambulation/Gait Ambulation/Gait assistance: Min guard Ambulation Distance (Feet): 40 Feet Assistive device: Rolling walker (2 wheeled) Gait Pattern/deviations: Step-through pattern;Decreased stride length;Trunk flexed     General Gait Details: min/guard for safety, distance to tolerance   Stairs             Wheelchair Mobility    Modified Rankin (Stroke Patients Only)       Balance                                            Cognition  Arousal/Alertness: Awake/alert Behavior During Therapy: WFL for tasks assessed/performed Overall Cognitive Status: Within Functional Limits for tasks assessed                                        Exercises      General Comments        Pertinent Vitals/Pain Pain Assessment: 0-10 Pain Score: 3  Pain Location: abdomen Pain Descriptors / Indicators: Sore;Guarding;Grimacing Pain Intervention(s): Limited activity within patient's tolerance;Repositioned;Monitored during session    Home Living                      Prior Function            PT Goals (current goals can now be found in the care plan section) Progress towards PT goals: Progressing toward goals    Frequency    Min 3X/week      PT Plan Current plan remains appropriate    Co-evaluation              AM-PAC PT "6 Clicks" Daily Activity  Outcome Measure  Difficulty turning over in bed (including adjusting bedclothes, sheets and blankets)?: A Lot Difficulty moving from lying on back to sitting on the side of the  bed? : A Lot Difficulty sitting down on and standing up from a chair with arms (e.g., wheelchair, bedside commode, etc,.)?: A Lot Help needed moving to and from a bed to chair (including a wheelchair)?: A Little Help needed walking in hospital room?: A Little Help needed climbing 3-5 steps with a railing? : A Lot 6 Click Score: 14    End of Session Equipment Utilized During Treatment: Gait belt Activity Tolerance: Patient tolerated treatment well Patient left: in chair;with call bell/phone within reach   PT Visit Diagnosis: Other abnormalities of gait and mobility (R26.89)     Time: 0867-6195 PT Time Calculation (min) (ACUTE ONLY): 12 min  Charges:  $Gait Training: 8-22 mins                    G Codes:       Carmelia Bake, PT, DPT 08/05/2017 Pager: [phone]  York Ram E 08/05/2017, 12:30 PM

## 2017-08-05 NOTE — Discharge Instructions (Signed)
[facility]      [facility] °[phone] ° °OPEN ABDOMINAL SURGERY: POST OP INSTRUCTIONS ° °Always review your discharge instruction sheet given to you by the facility where your surgery was performed. ° °IF YOU HAVE DISABILITY OR FAMILY LEAVE FORMS, YOU MUST BRING THEM TO THE OFFICE FOR PROCESSING.  PLEASE DO NOT GIVE THEM TO YOUR DOCTOR. ° °1. A prescription for pain medication may be given to you upon discharge.  Take your pain medication as prescribed, if needed.  If narcotic pain medicine is not needed, then you may take acetaminophen (Tylenol) or ibuprofen (Advil) as needed. °2. Take your usually prescribed medications unless otherwise directed. °3. If you need a refill on your pain medication, please contact your pharmacy. They will contact our office to request authorization.  Prescriptions will not be filled after 5pm or on week-ends. °4. You should follow a light diet the first few days after arrival home, such as soup and crackers, pudding, etc.unless your doctor has advised otherwise. A high-fiber, low fat diet can be resumed as tolerated.   Be sure to include lots of fluids daily. Most patients will experience some swelling and bruising on the chest and neck area.  Ice packs will help.  Swelling and bruising can take several days to resolve °5. Most patients will experience some swelling and bruising in the area of the incision. Ice pack will help. Swelling and bruising can take several days to resolve..  °6. It is common to experience some constipation if taking pain medication after surgery.  Increasing fluid intake and taking a stool softener will usually help or prevent this problem from occurring.  A mild laxative (Milk of Magnesia or Miralax) should be taken according to package directions if there are no bowel movements after 48 hours. °7.  You may have steri-strips (small skin tapes) in place directly over the incision.  These strips should be left on the skin for 7-10 days.  If your  surgeon used skin glue on the incision, you may shower in 24 hours.  The glue will flake off over the next 2-3 weeks.  Any sutures or staples will be removed at the office during your follow-up visit. You may find that a light gauze bandage over your incision may keep your staples from being rubbed or pulled. You may shower and replace the bandage daily. °8. ACTIVITIES:  You may resume regular (light) daily activities beginning the next day--such as daily self-care, walking, climbing stairs--gradually increasing activities as tolerated.  You may have sexual intercourse when it is comfortable.  Refrain from any heavy lifting or straining until approved by your doctor. °a. You may drive when you no longer are taking prescription pain medication, you can comfortably wear a seatbelt, and you can safely maneuver your car and apply brakes °b. Return to Work: ___________________________________ °9. You should see your doctor in the office for a follow-up appointment approximately two weeks after your surgery.  Make sure that you call for this appointment within a day or two after you arrive home to insure a convenient appointment time. °OTHER INSTRUCTIONS:  °_____________________________________________________________ °_____________________________________________________________ ° °WHEN TO CALL YOUR DOCTOR: °1. Fever over 101.0 °2. Inability to urinate °3. Nausea and/or vomiting °4. Extreme swelling or bruising °5. Continued bleeding from incision. °6. Increased pain, redness, or drainage from the incision. °7. Difficulty swallowing or breathing °8. Muscle cramping or spasms. °9. Numbness or tingling in hands or feet or around lips. ° °The clinic staff is available to   answer your questions during regular business hours.  Please dont hesitate to call and ask to speak to one of the nurses if you have concerns.  For further questions, please visit [email or web address]   Surgical [facility] Care Surgical  drains are used to remove extra fluid that normally builds up in a surgical wound after surgery. A surgical drain helps to heal a surgical wound. Different kinds of surgical drains include:  Active drains. These drains use suction to pull drainage away from the surgical wound. Drainage flows through a tube to a container outside of the body. It is important to keep the bulb or the drainage container flat (compressed) at all times, except while you empty it. Flattening the bulb or container creates suction. The two most common types of active drains are bulb drains and Hemovac drains.  Passive drains. These drains allow fluid to drain naturally, by gravity. Drainage flows through a tube to a bandage (dressing) or a container outside of the body. Passive drains do not need to be emptied. The most common type of passive drain is the Penrose drain.  A drain is placed during surgery. Immediately after surgery, drainage is usually bright red and a little thicker than water. The drainage may gradually turn yellow or pink and become thinner. It is likely that your health care provider will remove the drain when the drainage stops or when the amount decreases to 1-2 Tbsp (15-30 mL) during a 24-hour period. How to care for your surgical drain  Keep the skin around the drain dry and covered with a dressing at all times.  Check your drain area every day for signs of infection. Check for: ? More redness, swelling, or pain. ? Pus or a bad smell. ? Cloudy drainage. Follow instructions from your health care provider about how to take care of your drain and how to change your dressing. Change your dressing at least one time every day. Change it more often if needed to keep the dressing dry. Make sure you: 1. Gather your supplies, including: ? Tape. ? Germ-free cleaning solution (sterile saline). ? Split gauze drain sponge: 4 x 4 inches (10 x 10 cm). ? Gauze square: 4 x 4 inches (10 x 10 cm). 2. Wash your hands  with soap and water before you change your dressing. If soap and water are not available, use hand sanitizer. 3. Remove the old dressing. Avoid using scissors to do that. 4. Use sterile saline to clean your skin around the drain. 5. Place the tube through the slit in a drain sponge. Place the drain sponge so that it covers your wound. 6. Place the gauze square or another drain sponge on top of the drain sponge that is on the wound. Make sure the tube is between those layers. 7. Tape the dressing to your skin. 8. If you have an active bulb or Hemovac drain, tape the drainage tube to your skin 1-2 inches (2.5-5 cm) below the place where the tube enters your body. Taping keeps the tube from pulling on any stitches (sutures) that you have. 9. Wash your hands with soap and water. 10. Write down the color of your drainage and how often you change your dressing.  How to empty your active bulb or Hemovac drain 1. Make sure that you have a measuring cup that you can empty your drainage into. 2. Wash your hands with soap and water. If soap and water are not available, use hand sanitizer. 3. Gently move your fingers  down the tube while squeezing very lightly. This is called stripping the tube. This clears any drainage, clots, or tissue from the tube. ? Do not pull on the tube. ? You may need to strip the tube several times every day to keep the tube clear. 4. Open the bulb cap or the drain plug. Do not touch the inside of the cap or the bottom of the plug. 5. Empty all of the drainage into the measuring cup. 6. Compress the bulb or the container and replace the cap or the plug. To compress the bulb or the container, squeeze it firmly in the middle while you close the cap or plug the container. 7. Write down the amount of drainage that you have in each 24-hour period. If you have less than 2 Tbsp (30 mL) of drainage during 24 hours, contact your health care provider. 8. Flush the drainage down the  toilet. 9. Wash your hands with soap and water. Contact a health care provider if:  You have more redness, swelling, or pain around your drain area.  The amount of drainage that you have is increasing instead of decreasing.  You have pus or a bad smell coming from your drain area.  You have a fever.  You have drainage that is cloudy.  There is a sudden stop or a sudden decrease in the amount of drainage that you have.  Your tube falls out.  Your active draindoes not stay compressedafter you empty it. This information is not intended to replace advice given to you by your health care provider. Make sure you discuss any questions you have with your health care provider. Document Released: 03/06/2000 Document Revised: 08/15/2015 Document Reviewed: 09/26/2014 Elsevier Interactive Patient Education  2018 [employer].

## 2017-08-05 NOTE — Progress Notes (Addendum)
[facility] Progress Note  6 Days Post-Op  Subjective: CC: wants to go home Patient denies abdominal pain. Walked to the bathroom last night. Tolerating FLD although does not like the food here. Having bowel function. Really wants to get home this afternoon.   Objective: Vital signs in last 24 hours: Temp:  [98.1 F (36.7 C)-98.6 F (37 C)] 98.2 F (36.8 C) (05/16 0437) Pulse Rate:  [72-92] 72 (05/16 0437) Resp:  [18] 18 (05/16 0437) BP: (151-166)/(77-97) 151/77 (05/16 0437) SpO2:  [94 %-98 %] 95 % (05/16 0437) Last BM Date: 08/05/17  Intake/Output from previous day: 05/15 0701 - 05/16 0700 In: 1923.5 [P.O.:360; I.V.:1563.5] Out: 300 [Urine:250; Drains:50] Intake/Output this shift: No intake/output data recorded.  PE: Gen:  Alert, NAD, pleasant Card:  Regular rate and rhythm Pulm:  Normal effort, clear to auscultation bilaterally Abd: Soft, non-tender, non-distended, bowel sounds present, incision C/D/I with staples, drain with SS drainage present Skin: warm and dry, no rashes  Psych: A&Ox3    Lab Results:  Recent Labs    08/04/17 0418  WBC 11.5*  HGB 12.1  HCT 36.0  PLT 350   BMET Recent Labs    08/03/17 0425 08/04/17 0418  NA 140 140  K 4.0 3.7  CL 105 104  CO2 25 25  GLUCOSE 196* 192*  BUN 15 16  CREATININE 0.88 0.85  CALCIUM 8.2* 8.3*   PT/INR No results for input(s): LABPROT, INR in the last 72 hours. CMP     Component Value Date/Time   NA 140 08/04/2017 0418   NA 143 07/08/2015 1023   K 3.7 08/04/2017 0418   CL 104 08/04/2017 0418   CO2 25 08/04/2017 0418   GLUCOSE 192 (H) 08/04/2017 0418   BUN 16 08/04/2017 0418   BUN 21 07/08/2015 1023   CREATININE 0.85 08/04/2017 0418   CREATININE 0.86 05/22/2016 0903   CALCIUM 8.3 (L) 08/04/2017 0418   PROT 6.2 (L) 08/02/2017 0722   PROT 6.5 10/03/2013 0920   ALBUMIN 2.4 (L) 08/02/2017 0722   ALBUMIN 4.3 10/03/2013 0920   AST 19 08/02/2017 0722   ALT 21 08/02/2017 0722   ALKPHOS  73 08/02/2017 0722   BILITOT 1.0 08/02/2017 0722   GFRNONAA >60 08/04/2017 0418   GFRNONAA 69 05/22/2016 0903   GFRAA >60 08/04/2017 0418   GFRAA 80 05/22/2016 0903   Lipase     Component Value Date/Time   LIPASE 21 07/27/2017 1039       Studies/Results: No results found.  Anti-infectives: Anti-infectives (From admission, onward)   Start     Dose/Rate Route Frequency Ordered Stop   08/02/17 1800  vancomycin (VANCOCIN) 1,250 mg in sodium chloride 0.9 % 250 mL IVPB  Status:  Discontinued     1,250 mg 166.7 mL/hr over 90 Minutes Intravenous Every 24 hours 08/01/17 1717 08/03/17 0955   08/01/17 1800  vancomycin (VANCOCIN) 2,000 mg in sodium chloride 0.9 % 500 mL IVPB     2,000 mg 250 mL/hr over 120 Minutes Intravenous  Once 08/01/17 1717 08/01/17 2045   08/01/17 1800  meropenem (MERREM) 1 g in sodium chloride 0.9 % 100 mL IVPB  Status:  Discontinued     1 g 200 mL/hr over 30 Minutes Intravenous Every 8 hours 08/01/17 1722 08/03/17 0955   07/30/17 0600  ciprofloxacin (CIPRO) IVPB 400 mg     400 mg 200 mL/hr over 60 Minutes Intravenous  Once 07/29/17 1247 07/30/17 1242   07/30/17 0600  metroNIDAZOLE (FLAGYL) IVPB  500 mg     500 mg 100 mL/hr over 60 Minutes Intravenous  Once 07/29/17 1247 07/30/17 1240       Assessment/Plan HTN HLD Hx of diverticulitis T2DM-SSI Chronic venous insufficiency Morbid obesity  Readmission for SBO likely 2/2 large ventral hernia - s/p ex-lap with extensive LOA, partial omentectomy and repair of ventral hernia with mesh- 07/30/2017 - [facility] - POD#6 (admited 07/27/17) HD 8 - drain with 50 cc outSS- continue drain, hopefully can d/c prior to discharge - mobilize as tolerated, encourage IS - advance to soft diet - patient may go home when reliably tolerating a soft diet, likely this afternoon vs tomorrow AM  Leukocytosis- improved Acute encephalopathy- resolved  BPZ:WCHE diet VTE: SCD's,  lovenox NI:DPOEU/MPNTIR periop; vanc/merrem 5/12-5/14/19 Foley:none Follow up:[facility]  Plan: advance to soft diet. Home when tolerating. Scheduled appointment for staple removal - if she goes home this afternoon will keep drain, if tomorrow may be able to d/c drain depending on output   LOS: 9 days   Brigid Re , [facility] 08/05/2017, 8:56 AM Pager: [phone] Consults: [phone]  Agree with above.  She is doing well. She is anxious to go home. She needs to empty the drain twice a day.  She is to see [place] back in the office on 5/21 for drain removal and wound check.  Alphonsa Overall, MD, [facility] Pager: [phone] Office phone:  [phone]

## 2017-08-05 NOTE — Discharge Summary (Signed)
Physician Discharge Summary  Debra [name] TKP:[id number] DOB: 10-20-1945 DOA: 07/27/2017  PCP: Debra Aly, FNP  Admit date: 07/27/2017 Discharge date: 08/05/2017  Admitted From: home  Disposition:  home     Home Health:  PT  Equipment/Devices:  Rolling walker    Discharge Condition:  stable   CODE STATUS:  Full code   Consultations:  gen surgery    Discharge Diagnoses:  Principal Problem:   SBO (small bowel obstruction) ([place]) Active Problems: Swelling of right knee   Type 2 diabetes, uncontrolled, with neuropathy ([place])   Essential hypertension   GERD (gastroesophageal reflux disease)   Severe obesity (BMI >= 40) ([place])   Diabetes mellitus with neurological manifestation ([place])   Hypertension goal BP (blood pressure) < 130/80   Hyperlipidemia associated with type 2 diabetes mellitus (     Brief Summary: Debra [name] is a 72 y/o with DM2 with neuropathy, GERD, HTN, obesity, large ventral abdominal hernia who was admitted from 5/2- 5/5/ for SBO that resolved with conservative management. She presents again for vomiting and is found to have a recurrent SBO.    Hospital Course:  Principal Problem:   SBO (small bowel obstruction) - per general surgery - underwent hernia repair with mesh, lysis of adhesions and partial omentectomy on 5/10 Gen surgery advised to d/c home if taking in solids without trouble. Has tolerated soft food for lunch.  Per RN, she is dressed to leave and asking about discharge. 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 - due to above xray, Dr Debra [name] placed her on Steroids and after 2 days of this, swelling has resolved and she has been ambulating without pain - completed 3 days of steroids total     Essential hypertension - on Toprol 50 mg daily    GERD (gastroesophageal reflux disease) - on daily PPI    Severe obesity (BMI >= 40) ([facility]) Body mass index is 45.66 kg/m.   Discharge Exam: Vitals:   08/05/17 0437 08/05/17 1308  BP: (!) 151/77 126/77  Pulse: 72 84  Resp: 18 18  Temp: 98.2 F (36.8 C) 98.8 F (37.1 C)  SpO2: 95% 97%   Vitals:   08/04/17 1336 08/04/17 2004 08/05/17 0437 08/05/17 1308  BP: (!) 157/97 (!) 166/85 (!) 151/77 126/77  Pulse: 92 77 72 84  Resp: 18 18 18 18   Temp: 98.6 F (37 C) 98.1 F (36.7 C) 98.2 F (36.8 C) 98.8 F (37.1 C)  TempSrc: Oral Oral Oral Oral  SpO2: 94% 98% 95% 97%  Weight:      Height:        General: Pt is alert, awake, not in acute distress Cardiovascular: RRR, S1/S2 +, no rubs, no gallops Respiratory: CTA bilaterally, no wheezing, no rhonchi Abdominal: Soft, non-distended, drain in place with serosanguinous fluid, bowel sounds + Extremities: no edema, no cyanosis  Discharge Instructions  Discharge Instructions    Diet - low sodium heart healthy   Complete by:  As directed    Diet Carb Modified   Complete by:  As directed    Increase activity slowly   Complete by:  As directed      Allergies as of 08/05/2017      Reactions   Aspirin Nausea Only   And causes bruises   Banana Hives   Hydrocodone Hives   Neomycin-polymyxin-gramicidin Itching, Swelling   Eye drops caused swelling in face and rash and itching on arms and neck   Penicillins Hives, Itching   Has patient had a PCN reaction causing immediate rash, facial/tongue/throat swelling, SOB or lightheadedness with hypotension: Yes Has patient had a PCN  reaction causing severe rash involving mucus membranes or skin necrosis: No Has patient had a PCN reaction that required hospitalization: Yes Has patient had a PCN reaction occurring within the last 10 years: Yes If all of the above answers are "NO", then may proceed with Cephalosporin use.   Tramadol Nausea And Vomiting   Voltaren [diclofenac Sodium] Nausea And Vomiting   Latex Hives, Rash      Medication List    STOP taking these medications   furosemide 20 MG tablet Commonly known as:  LASIX     TAKE these medications   atorvastatin 40 MG tablet Commonly known as:  LIPITOR TAKE 1 TABLET BY MOUTH EVERY DAY What changed:    how much to take  how to take this  when to take this   celecoxib 200 MG capsule Commonly known as:  CELEBREX Take 200 mg by mouth 2 (two) times daily.   diphenhydrAMINE 25 MG tablet Commonly known as:  BENADRYL Take 25 mg by mouth at bedtime as needed for itching.   feeding supplement (PRO-STAT SUGAR FREE 64) Liqd Take 30 mLs by mouth 2 (two) times daily.   glucose blood test strip Commonly known as:  ACCU-CHEK AVIVA PLUS Ell.65 check blood sugar twice daily as directed   insulin NPH Human 100 UNIT/ML injection Commonly known as:  HUMULIN N,NOVOLIN N Inject 76-98 Units into the skin See admin instructions. Injects 98 units before breakfast and 76 units before supper.   Insulin Pen Needle 32G X 4 MM Misc Commonly known as:  BD PEN NEEDLE NANO U/F Use once daily with the administration of [place]jeo DX E11.65   metFORMIN 500 MG tablet Commonly known as:  GLUCOPHAGE Take 1 tablet (500 mg total) by mouth daily with breakfast.   metoprolol succinate 50 MG 24 hr tablet Commonly known as:  TOPROL-XL TAKE 1 TABLET BY MOUTH ONCE DAILY   omeprazole 20 MG capsule Commonly known as:  PRILOSEC TAKE 1 CAPSULE BY MOUTH DAILY   potassium chloride SA 20 MEQ tablet Commonly known as:  K-DUR,KLOR-CON TAKE 1 TABLET(20 MEQ) BY MOUTH DAILY       Follow-up Information    Debra Kussmaul, MD. Go on 08/24/2017.   Specialty:  General Surgery Why:  Your appointment is scheduled for 11:20 AM. Please arrive 30 min prior to appointment time.  Contact information: [address] [name] [address] [phone]        [facility], [place]. Go on 08/10/2017.   Specialty:  General Surgery Why:  Appointment for drain and staple removal is scheduled for 10:00 AM. Please arrive 30 min prior to appointment time. Bring photo ID and insurance information.  Contact information: [place]  [place] [phone]         Allergies  Allergen Reactions  . Aspirin Nausea Only    And causes bruises  . Banana Hives  . Hydrocodone Hives  . Neomycin-Polymyxin-Gramicidin Itching and Swelling    Eye drops caused swelling in face and rash and itching on arms and neck  . Penicillins Hives and Itching    Has patient had a PCN reaction causing immediate rash, facial/tongue/throat swelling, SOB or lightheadedness with hypotension: Yes Has patient had a PCN reaction causing severe rash involving mucus membranes or skin necrosis: No Has patient had a PCN reaction that required hospitalization: Yes Has patient had a PCN reaction occurring within the last 10 years: Yes If all of the above answers are "NO", then may proceed with Cephalosporin use.   . Tramadol Nausea And Vomiting  . Voltaren [Diclofenac Sodium] Nausea And Vomiting  . Latex Hives and Rash     Procedures/Studies:   5/10 HERNIA REPAIR VENTRAL ADULTWITH MESH EXPLORATORY LAPAROTOMY (N/A) EXTENSIVE LYSIS OF ADHESION (N/A) PARTIAL OMENTECTOMY (N/A)     Ct Abdomen Pelvis W Contrast  Result Date: 08/02/2017 CLINICAL DATA:  Acute onset of nausea and vomiting. Recently diagnosed with small bowel obstruction. EXAM: CT ABDOMEN AND PELVIS WITH CONTRAST TECHNIQUE: Multidetector CT imaging of the abdomen and pelvis was  performed using the standard protocol following bolus administration of intravenous contrast. CONTRAST:  185mL ISOVUE-300 IOPAMIDOL (ISOVUE-300) INJECTION 61% COMPARISON:  CT of the abdomen and pelvis from 07/22/2017 FINDINGS: Lower chest: Mild bibasilar atelectasis or scarring is noted. The visualized portions of the mediastinum are unremarkable. Hepatobiliary: Pneumobilia is noted, likely reflecting prior instrumentation at the duodenal ampulla. The liver is otherwise unremarkable appearance. The patient is status post cholecystectomy. The common bile duct is grossly unremarkable in appearance. Pancreas: The pancreas is within normal limits. Spleen: The spleen is unremarkable in appearance. Adrenals/Urinary Tract: The adrenal glands are unremarkable in appearance. Mild nonspecific perinephric stranding is noted bilaterally. There is no evidence of hydronephrosis. No renal or ureteral stones are identified. Stomach/Bowel: The stomach is unremarkable in appearance. The small bowel is within normal limits. The patient is status post appendectomy. Minimal wall thickening along the transverse colon is likely reactive in nature. Scattered diverticulosis is noted along the descending and sigmoid colon, without evidence of diverticulitis. Vascular/Lymphatic: Scattered calcification is seen along the abdominal aorta and its branches. The abdominal aorta is otherwise grossly unremarkable. The inferior vena cava is grossly unremarkable. No retroperitoneal lymphadenopathy is seen. No pelvic sidewall lymphadenopathy is identified. Reproductive: The bladder is decompressed, with a Foley catheter in place. The patient is status post hysterectomy. A mildly prominent 3.9 cm left adnexal cystic focus is noted, only minimally changed from 2017 and likely benign. Other: Postoperative inflammation and scattered soft tissue air is noted along the anterior abdominal wall, with diffuse omental inflammation. Soft tissue inflammation  tracks particularly about small bowel loops at the right mid abdomen. A drainage catheter is seen underlying the surgical site, without a significant fluid collection. Musculoskeletal: No acute osseous abnormalities are identified. The visualized musculature is unremarkable in appearance. IMPRESSION: 1. Postoperative inflammation and soft tissue air along the anterior abdominal wall, with diffuse omental inflammation. Soft tissue inflammation tracks particularly about small bowel loops at the right mid abdomen, without evidence of bowel perforation at this time. 2. Drainage catheter underlying the surgical site, without a significant fluid collection. 3. Minimal wall thickening along the transverse colon is likely reactive in nature. 4. Scattered  diverticulosis along the descending and sigmoid colon, without evidence of diverticulitis. 5. Mildly prominent 3.9 cm left adnexal cystic focus is relatively stable from prior studies and likely benign. 6. Pneumobilia likely reflects prior instrumentation at the duodenal ampulla. Aortic Atherosclerosis (ICD10-I70.0). Electronically Signed   By: Garald Balding M.D.   On: 08/02/2017 00:37   Ct Abdomen Pelvis W Contrast  Result Date: 07/22/2017 CLINICAL DATA:  Acute left lower quadrant abdominal pain. EXAM: CT ABDOMEN AND PELVIS WITH CONTRAST TECHNIQUE: Multidetector CT imaging of the abdomen and pelvis was performed using the standard protocol following bolus administration of intravenous contrast. CONTRAST:  69mL ISOVUE-300 IOPAMIDOL (ISOVUE-300) INJECTION 61% COMPARISON:  CT scan of February 15, 2016. FINDINGS: Lower chest: No acute abnormality. Hepatobiliary: Status post cholecystectomy. Stable left-sided pneumobilia is noted. Liver is otherwise unremarkable. No biliary dilatation is noted. Pancreas: Unremarkable. No pancreatic ductal dilatation or surrounding inflammatory changes. Spleen: Normal in size without focal abnormality. Adrenals/Urinary Tract: Stable left  adrenal adenoma. Right adrenal gland appears normal. No hydronephrosis or renal obstruction is noted. No renal or ureteral calculi are noted. Urinary bladder is decompressed. Stomach/Bowel: The stomach appears normal. Status post appendectomy. Diverticulosis of descending and sigmoid colon is noted without inflammation. Large ventral hernia is noted in the pelvis which contains loops of large and small bowel. Transverse colon is nondilated and there for does not appear to be incarcerated. However, there does appear to be dilatation of small-bowel loops within the hernia, as well as the loops proximal to the hernia secondary to obstruction. Vascular/Lymphatic: Aortic atherosclerosis. No enlarged abdominal or pelvic lymph nodes. Reproductive: Status post hysterectomy. No adnexal masses. Other: No abnormal fluid collection is noted. Musculoskeletal: No acute or significant osseous findings. IMPRESSION: Large ventral hernia is noted in the pelvis which contains loops of large and small bowel. It appears to be causing obstruction of the more proximal small bowel as well as dilatation of the small bowel within the hernia. No large bowel dilatation is noted. Aortic Atherosclerosis (ICD10-I70.0). Electronically Signed   By: Marijo Conception, M.D.   On: 07/22/2017 15:41   Dg Chest Port 1 View  Result Date: 08/01/2017 CLINICAL DATA:  Tachycardia.  Ventral hernia repair on 07/30/2017. EXAM: PORTABLE CHEST 1 VIEW COMPARISON:  07/27/2017 FINDINGS: Cardiomediastinal silhouette is unchanged. An NG tube is identified entering the stomach with tip off the field of view. This is a low volume film with scattered areas of subsegmental atelectasis within bilateral mid and lower lungs. There is no evidence of pneumothorax or definite pleural effusion. Mild pulmonary vascular congestion is present. IMPRESSION: Scattered subsegmental atelectasis within bilateral mid and lower lungs. Mild pulmonary vascular congestion. Electronically  Signed   By: Margarette Canada M.D.   On: 08/01/2017 17:50   Dg Abdomen Acute W/chest  Result Date: 07/27/2017 CLINICAL DATA:  Discharge from the hospital 2 days ago for bowel obstruction. Persistent vomiting. EXAM: DG ABDOMEN ACUTE W/ 1V CHEST COMPARISON:  Abdominal radiograph of Jul 23, 2017 FINDINGS: The lungs are well-expanded and clear. The heart and pulmonary vascularity are normal. There is calcification in the wall of the aortic arch. Within the abdomen there is scattered air-fluid levels in both small and large bowel. There is some gas and fluid in the stomach without significant distention. There is gas and stool in the rectum. There is biliary tract gas presumably from previous sphincterotomy. There surgical clips in the gallbladder fossa. IMPRESSION: Findings likely reflect a persistent partial mid to distal small bowel obstruction versus ileus. No  evidence of perforation. Electronically Signed   By: David  Martinique M.D.   On: 07/27/2017 11:30   Dg Abd Portable 1v  Result Date: 07/29/2017 CLINICAL DATA:  Small-bowel obstruction EXAM: PORTABLE ABDOMEN - 1 VIEW COMPARISON:  07/28/2017 FINDINGS: NG tube within the stomach which is collapsed. Small bowel dilatation in the left lower abdomen measures 4.8 cm in diameter. There is contrast in the right colon. Remote cholecystectomy noted. IMPRESSION: NG tube within the stomach. Residual small bowel dilatation but contrast has progressed into the colon. Electronically Signed   By: Jerilynn Mages.  Shick M.D.   On: 07/29/2017 07:53   Dg Abd Portable 1v-small Bowel Obstruction Protocol-initial, 8 Hr Delay  Result Date: 07/28/2017 CLINICAL DATA:  Initial evaluation for enteric tube placement. EXAM: PORTABLE ABDOMEN - 1 VIEW COMPARISON:  Prior radiograph from 07/27/2017. FINDINGS: Enteric tube in place with tip in side hole overlying the stomach, well beyond the GE junction. Tip projects inferiorly. Bowel gas pattern within normal limits without obstruction. Enteric contrast  material within the right colon. Cholecystectomy clips noted. Suture material overlies the right abdomen. No soft tissue mass or abnormal calcification. Visualized osseous structures within normal limits. IMPRESSION: 1. Enteric tube in place with tip in side hole overlying the stomach, well beyond the GE junction. 2. Nonobstructive bowel gas pattern with enteric contrast material within the right colon. Electronically Signed   By: Jeannine Boga M.D.   On: 07/28/2017 01:08   Dg Abd Portable 1v-small Bowel Protocol-position Verification  Result Date: 07/27/2017 CLINICAL DATA:  Nasogastric tube placement EXAM: PORTABLE ABDOMEN - 1 VIEW COMPARISON:  Chest radiograph 07/27/2017 FINDINGS: Nasogastric tube tip and side port below the diaphragm. The side port is approximately 2.5 cm distal to the expected location of the gastroesophageal junction. This could be advanced by 4-5 cm. Visualized lungs are clear. No dilated small bowel. IMPRESSION: NG tube side port approximately 2 cm distal to the GE junction. This could be advanced by 4-5 cm for optimal positioning. Electronically Signed   By: Ulyses Jarred M.D.   On: 07/27/2017 15:21   Dg Abd Portable 1v-small Bowel Obstruction Protocol-initial, 8 Hr Delay  Result Date: 07/23/2017 CLINICAL DATA:  Small bowel obstruction. EXAM: PORTABLE ABDOMEN - 1 VIEW COMPARISON:  07/22/2017, 1752 hours FINDINGS: NG tube in stomach. There is oral contrast within the ascending colon. No dilated loops of large or small bowel. No initial film provided. Comparison 24 hours prior. IMPRESSION: Contrast within the ascending colon. No evidence of bowel obstruction. Electronically Signed   By: Suzy Bouchard M.D.   On: 07/23/2017 20:15   Dg Abd Portable 1 View  Result Date: 07/22/2017 CLINICAL DATA:  Evaluate NG tube placement EXAM: PORTABLE ABDOMEN - 1 VIEW COMPARISON:  CT abdomen pelvis 07/22/2017 FINDINGS: Enteric tube tip and side-port project over the stomach. Contrast material  excreted in the bilateral renal collecting systems. Mildly gaseous distended loop of small bowel within the central abdomen. IMPRESSION: Enteric tube tip and side-port project over the stomach. Electronically Signed   By: Lovey Newcomer M.D.   On: 07/22/2017 18:21     The results of significant diagnostics from this hospitalization (including imaging, microbiology, ancillary and laboratory) are listed below for reference.     Microbiology: Recent Results (from the past 240 hour(s))  Surgical pcr screen     Status: None   Collection Time: 07/30/17  5:33 AM  Result Value Ref Range Status   MRSA, PCR NEGATIVE NEGATIVE Final   Staphylococcus aureus NEGATIVE NEGATIVE Final  Comment: (NOTE) The Xpert SA Assay (FDA approved for NASAL specimens in patients 61 years of age and older), is one component of a comprehensive surveillance program. It is not intended to diagnose infection nor to guide or monitor treatment. Performed at [facility] [place], [place] [address] [place], [place] [address]   Culture, blood (Routine X 2) w Reflex to ID Panel     Status: None (Preliminary result)   Collection Time: 08/01/17  6:19 PM  Result Value Ref Range Status   Specimen Description BLOOD RIGHT ANTECUBITAL  Final   Special Requests   Final    BOTTLES DRAWN AEROBIC AND ANAEROBIC Blood Culture adequate volume   Culture   Final    NO GROWTH 3 DAYS Performed at [facility], [place] [address], [place], [place] [address]    Report Status PENDING  Incomplete  Culture, blood (Routine X 2) w Reflex to ID Panel     Status: None (Preliminary result)   Collection Time: 08/01/17  6:19 PM  Result Value Ref Range Status   Specimen Description BLOOD RIGHT HAND  Final   Special Requests   Final    BOTTLES DRAWN AEROBIC AND ANAEROBIC Blood Culture adequate volume   Culture   Final    NO GROWTH 3 DAYS Performed at [facility], [place] [address], [place], [place] [address]    Report Status PENDING   Incomplete     Labs: BNP (last 3 results) No results for input(s): BNP in the last 8760 hours. Basic Metabolic Panel: Recent Labs  Lab 07/30/17 0545 08/01/17 1555 08/02/17 0722 08/03/17 0425 08/04/17 0418  NA 140 136 136 140 140  K 3.7 4.2 4.0 4.0 3.7  CL 106 99* 101 105 104  CO2 24 26 24 25 25   GLUCOSE 179* 185* 202* 196* 192*  BUN 6 10 12 15 16   CREATININE 0.67 1.08* 0.97 0.88 0.85  CALCIUM 7.8* 8.1* 8.1* 8.2* 8.3*  MG  --   --  1.2*  --   --    Liver Function Tests: Recent Labs  Lab 08/02/17 0722  AST 19  ALT 21  ALKPHOS 73  BILITOT 1.0  PROT 6.2*  ALBUMIN 2.4*   No results for input(s): LIPASE, AMYLASE in the last 168 hours. No results for input(s): AMMONIA in the last 168 hours. CBC: Recent Labs  Lab 07/30/17 0545 08/01/17 1555 08/01/17 1819 08/02/17 0722 08/04/17 0418  WBC 5.4 19.0* 17.5* 21.5* 11.5*  NEUTROABS  --  15.2* 14.4* 18.4*  --   HGB 12.2 13.4 13.3 12.8 12.1  HCT 36.8 40.5 39.7 38.2 36.0  MCV 91.5 91.4 90.8 90.5 90.7  PLT 248 320 291 272 350   Cardiac Enzymes: Recent Labs  Lab 08/01/17 1819 08/02/17 0015 08/02/17 0722  TROPONINI 0.04* 0.03* 0.03*   BNP: Invalid input(s): POCBNP CBG: Recent Labs  Lab 08/04/17 2002 08/05/17 0034 08/05/17 0434 08/05/17 0739 08/05/17 1205  GLUCAP 101* 109* 127* 113* 118*   D-Dimer No results for input(s): DDIMER in the last 72 hours. Hgb A1c No results for input(s): HGBA1C in the last 72 hours. Lipid Profile No results for input(s): CHOL, HDL, LDLCALC, TRIG, CHOLHDL, LDLDIRECT in the last 72 hours. Thyroid function studies No results for input(s): TSH, T4TOTAL, T3FREE, THYROIDAB in the last 72 hours.  Invalid input(s): FREET3 Anemia work up No results for input(s): VITAMINB12, FOLATE, FERRITIN, TIBC, IRON, RETICCTPCT in the last 72 hours. Urinalysis    Component Value Date/Time  COLORURINE AMBER (A) 07/27/2017 1518   APPEARANCEUR CLOUDY (A) 07/27/2017 1518   LABSPEC 1.032 (H)  07/27/2017 1518   PHURINE 5.0 07/27/2017 1518   GLUCOSEU 50 (A) 07/27/2017 1518   GLUCOSEU NEG mg/dL 04/13/2007 0230   HGBUR NEGATIVE 07/27/2017 1518   BILIRUBINUR SMALL (A) 07/27/2017 1518   BILIRUBINUR Neg 01/01/2015 1348   KETONESUR 5 (A) 07/27/2017 1518   PROTEINUR 100 (A) 07/27/2017 1518   UROBILINOGEN 2.0 01/01/2015 1348   UROBILINOGEN 0.2 04/13/2013 0714   NITRITE NEGATIVE 07/27/2017 1518   LEUKOCYTESUR TRACE (A) 07/27/2017 1518   Sepsis Labs Invalid input(s): PROCALCITONIN,  WBC,  LACTICIDVEN Microbiology Recent Results (from the past 240 hour(s))  Surgical pcr screen     Status: None   Collection Time: 07/30/17  5:33 AM  Result Value Ref Range Status   MRSA, PCR NEGATIVE NEGATIVE Final   Staphylococcus aureus NEGATIVE NEGATIVE Final    Comment: (NOTE) The Xpert SA Assay (FDA approved for NASAL specimens in patients 77 years of age and older), is one component of a comprehensive surveillance program. It is not intended to diagnose infection nor to guide or monitor treatment. Performed at [facility], [place] [address] [place], [place] [address]   Culture, blood (Routine X 2) w Reflex to ID Panel     Status: None (Preliminary result)   Collection Time: 08/01/17  6:19 PM  Result Value Ref Range Status   Specimen Description BLOOD RIGHT ANTECUBITAL  Final   Special Requests   Final    BOTTLES DRAWN AEROBIC AND ANAEROBIC Blood Culture adequate volume   Culture   Final    NO GROWTH 3 DAYS Performed at [facility], [place] [address] [place], [place] [address]    Report Status PENDING  Incomplete  Culture, blood (Routine X 2) w Reflex to ID Panel     Status: None (Preliminary result)   Collection Time: 08/01/17  6:19 PM  Result Value Ref Range Status   Specimen Description BLOOD RIGHT HAND  Final   Special Requests   Final    BOTTLES DRAWN AEROBIC AND ANAEROBIC Blood Culture adequate volume   Culture   Final    NO GROWTH 3 DAYS Performed at  [facility], [place] [address], [place],  [address]    Report Status PENDING  Incomplete     Time coordinating discharge in minutes: 65  SIGNED:   Debbe Odea, MD  [facility] 08/05/2017, 1:58 PM Pager   If 7PM-7AM, please contact night-coverage [email or web address] Password [email or web address]

## 2017-08-07 LAB — CULTURE, BLOOD (ROUTINE X 2)
Culture: NO GROWTH
Culture: NO GROWTH
SPECIAL REQUESTS: ADEQUATE
Special Requests: ADEQUATE

## 2017-10-05 ENCOUNTER — Other Ambulatory Visit: Payer: Self-pay | Admitting: General Surgery

## 2017-10-05 DIAGNOSIS — K436 Other and unspecified ventral hernia with obstruction, without gangrene: Secondary | ICD-10-CM

## 2017-10-09 ENCOUNTER — Other Ambulatory Visit: Payer: Self-pay | Admitting: Internal Medicine

## 2017-10-09 DIAGNOSIS — I10 Essential (primary) hypertension: Secondary | ICD-10-CM

## 2017-11-02 ENCOUNTER — Ambulatory Visit
Admission: RE | Admit: 2017-11-02 | Discharge: 2017-11-02 | Disposition: A | Discharge status: Home / Self Care | Payer: Medicare HMO | Source: Ambulatory Visit | Attending: General Surgery | Admitting: General Surgery

## 2017-11-02 DIAGNOSIS — K436 Other and unspecified ventral hernia with obstruction, without gangrene: Secondary | ICD-10-CM

## 2017-11-09 ENCOUNTER — Other Ambulatory Visit (HOSPITAL_COMMUNITY): Payer: Self-pay | Admitting: General Surgery

## 2017-11-09 DIAGNOSIS — T888XXA Other specified complications of surgical and medical care, not elsewhere classified, initial encounter: Secondary | ICD-10-CM

## 2017-11-16 ENCOUNTER — Other Ambulatory Visit: Payer: Self-pay | Admitting: Radiology

## 2017-11-17 ENCOUNTER — Ambulatory Visit (HOSPITAL_COMMUNITY)
Admission: RE | Admit: 2017-11-17 | Discharge: 2017-11-17 | Disposition: A | Discharge status: Home / Self Care | Payer: Medicare HMO | Source: Ambulatory Visit | Attending: General Surgery | Admitting: General Surgery

## 2017-11-17 ENCOUNTER — Encounter (HOSPITAL_COMMUNITY): Payer: Self-pay

## 2017-11-17 DIAGNOSIS — I700 Atherosclerosis of aorta: Secondary | ICD-10-CM | POA: Diagnosis not present

## 2017-11-17 DIAGNOSIS — Z885 Allergy status to narcotic agent status: Secondary | ICD-10-CM | POA: Diagnosis not present

## 2017-11-17 DIAGNOSIS — E785 Hyperlipidemia, unspecified: Secondary | ICD-10-CM | POA: Insufficient documentation

## 2017-11-17 DIAGNOSIS — Z79899 Other long term (current) drug therapy: Secondary | ICD-10-CM | POA: Diagnosis not present

## 2017-11-17 DIAGNOSIS — T888XXA Other specified complications of surgical and medical care, not elsewhere classified, initial encounter: Secondary | ICD-10-CM

## 2017-11-17 DIAGNOSIS — Z794 Long term (current) use of insulin: Secondary | ICD-10-CM | POA: Diagnosis not present

## 2017-11-17 DIAGNOSIS — Z886 Allergy status to analgesic agent status: Secondary | ICD-10-CM | POA: Insufficient documentation

## 2017-11-17 DIAGNOSIS — K5730 Diverticulosis of large intestine without perforation or abscess without bleeding: Secondary | ICD-10-CM | POA: Insufficient documentation

## 2017-11-17 DIAGNOSIS — Z880 Allergy status to penicillin: Secondary | ICD-10-CM | POA: Insufficient documentation

## 2017-11-17 DIAGNOSIS — Z888 Allergy status to other drugs, medicaments and biological substances status: Secondary | ICD-10-CM | POA: Diagnosis not present

## 2017-11-17 DIAGNOSIS — Z6841 Body Mass Index (BMI) 40.0 and over, adult: Secondary | ICD-10-CM | POA: Insufficient documentation

## 2017-11-17 DIAGNOSIS — Y838 Other surgical procedures as the cause of abnormal reaction of the patient, or of later complication, without mention of misadventure at the time of the procedure: Secondary | ICD-10-CM | POA: Diagnosis not present

## 2017-11-17 DIAGNOSIS — I10 Essential (primary) hypertension: Secondary | ICD-10-CM | POA: Insufficient documentation

## 2017-11-17 DIAGNOSIS — M8580 Other specified disorders of bone density and structure, unspecified site: Secondary | ICD-10-CM | POA: Insufficient documentation

## 2017-11-17 DIAGNOSIS — L7634 Postprocedural seroma of skin and subcutaneous tissue following other procedure: Secondary | ICD-10-CM | POA: Diagnosis not present

## 2017-11-17 DIAGNOSIS — N83202 Unspecified ovarian cyst, left side: Secondary | ICD-10-CM | POA: Diagnosis not present

## 2017-11-17 DIAGNOSIS — E119 Type 2 diabetes mellitus without complications: Secondary | ICD-10-CM | POA: Diagnosis not present

## 2017-11-17 DIAGNOSIS — I872 Venous insufficiency (chronic) (peripheral): Secondary | ICD-10-CM | POA: Diagnosis not present

## 2017-11-17 LAB — CBC
HCT: 37.9 % (ref 36.0–46.0)
Hemoglobin: 12.3 g/dL (ref 12.0–15.0)
MCH: 29.6 pg (ref 26.0–34.0)
MCHC: 32.5 g/dL (ref 30.0–36.0)
MCV: 91.3 fL (ref 78.0–100.0)
Platelets: 223 10*3/uL (ref 150–400)
RBC: 4.15 MIL/uL (ref 3.87–5.11)
RDW: 13.3 % (ref 11.5–15.5)
WBC: 6.8 10*3/uL (ref 4.0–10.5)

## 2017-11-17 LAB — GLUCOSE, CAPILLARY: Glucose-Capillary: 160 mg/dL — ABNORMAL HIGH (ref 70–99)

## 2017-11-17 LAB — PROTIME-INR
INR: 0.98
Prothrombin Time: 12.9 s (ref 11.4–15.2)

## 2017-11-17 MED ORDER — SODIUM CHLORIDE 0.9 % IV SOLN: INTRAVENOUS | Status: DC

## 2017-11-17 MED ORDER — MIDAZOLAM HCL 2 MG/2ML IJ SOLN
INTRAMUSCULAR | Status: AC | PRN
  Administered 2017-11-17: 0.5 mg via INTRAVENOUS
  Administered 2017-11-17: 1 mg via INTRAVENOUS

## 2017-11-17 MED ORDER — LIDOCAINE HCL (PF) 1 % IJ SOLN
INTRAMUSCULAR | Status: AC
  Filled 2017-11-17: qty 30

## 2017-11-17 MED ORDER — FENTANYL CITRATE (PF) 100 MCG/2ML IJ SOLN
INTRAMUSCULAR | Status: AC | PRN
  Administered 2017-11-17: 25 ug via INTRAVENOUS
  Administered 2017-11-17: 50 ug via INTRAVENOUS

## 2017-11-17 MED ORDER — MIDAZOLAM HCL 2 MG/2ML IJ SOLN
INTRAMUSCULAR | Status: AC
  Filled 2017-11-17: qty 4

## 2017-11-17 MED ORDER — FENTANYL CITRATE (PF) 100 MCG/2ML IJ SOLN
INTRAMUSCULAR | Status: AC
  Filled 2017-11-17: qty 4

## 2017-11-17 NOTE — Discharge Instructions (Addendum)

## 2017-11-17 NOTE — Procedures (Signed)
Abdominal wall seroma aspiration 350 cc clear yellow EBL 0 Comp 0

## 2017-11-17 NOTE — H&P (Signed)
Chief Complaint: Patient was seen in consultation today for seroma  Referring Physician(s): Toth,Paul III  Supervising Physician: Marybelle Killings  Patient Status: [facility] - Out-pt  History of Present Illness: Debra [name] is a 72 y.o. female with past medical history of DM, chronic venous insufficiency, HLD, HTN, diverticulitis who underwent hernia repair surgery 07/30/17. Patient states she was discharged home with a JP drain in place, however little fluid was collected and the drain was removed soon after. She has since developed a fluctuance in her belly that is occasionally painful.   CT Abdomen/Pelvis 11/02/17: Large midline subcutaneous fluid collection in the area of prior surgical drain measuring up to 12.7 cm, presumably postoperative fluid collection/seroma. No visible recurrent hernia.  Prior cholecystectomy.  Stable pneumobilia.  Aortic atherosclerosis.  Stable left ovarian cystic lesion.  IR consulted for aspiration and drainage of abdominal wall fluid collection at the request of Dr. Marlou Starks.  Patient presents today in her usual state of health.   She denies fever, chills, nausea, vomiting, shortness of breath, cough, dysuria.   Past Medical History:  Diagnosis Date  . Cellulitis of left leg    history of, most recent episode 01/09  . Chronic venous insufficiency   . 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 Diverticulitis    h/o 2-3 episodes in past  . Hyperlipidemia LDL goal < 100   . Hypertension goal BP (blood pressure) < 130/80   . Leg edema    secondary to chronic venous insufficiency  . Morbid obesity ([place])   . Osteopenia    DEXA scan 7/07    Past Surgical History:  Procedure Laterality Date  . ABDOMINAL HYSTERECTOMY    . APPENDECTOMY    . CHOLECYSTECTOMY    . CHOLECYSTECTOMY N/A 04/14/2013   Procedure: LAPAROSCOPIC CHOLECYSTECTOMY WITH INTRAOPERATIVE CHOLANGIOGRAM;  Surgeon: Adin Hector, MD;  Location: [facility];  Service:  General;  Laterality: N/A;  . COLECTOMY     with diverting colsotmy and revision in the 1990's for diverticulitis  . ERCP N/A 04/17/2013   Procedure: ENDOSCOPIC RETROGRADE CHOLANGIOPANCREATOGRAPHY (ERCP);  Surgeon: Irene Shipper, MD;  Location: [facility];  Service: Endoscopy;  Laterality: N/A;  note pt want general anesthesia for this case.  preferto perform in endo unit, but if unavailable please arrange time in OR  . EYE SURGERY  8/13   left cataract removal & retinal repair  . INSERTION OF MESH N/A 07/30/2017   Procedure: INSERTION OF MESH;  Surgeon: Jovita Kussmaul, MD;  Location: [facility];  Service: General;  Laterality: N/A;  . LAPAROSCOPIC LYSIS OF ADHESIONS N/A 04/14/2013   Procedure: LAPAROSCOPIC LYSIS OF ADHESIONS;  Surgeon: Adin Hector, MD;  Location: [facility];  Service: General;  Laterality: N/A;  . LAPAROTOMY N/A 07/30/2017   Procedure: EXPLORATORY LAPAROTOMY;  Surgeon: Jovita Kussmaul, MD;  Location: [facility];  Service: General;  Laterality: N/A;  . LYSIS OF ADHESION N/A 07/30/2017   Procedure: EXTENSIVE LYSIS OF ADHESION;  Surgeon: Jovita Kussmaul, MD;  Location: [facility];  Service: General;  Laterality: N/A;  . OMENTECTOMY N/A 07/30/2017   Procedure: PARTIAL OMENTECTOMY;  Surgeon: Jovita Kussmaul, MD;  Location: [facility];  Service: General;  Laterality: N/A;  . VENTRAL HERNIA REPAIR N/A 07/30/2017   Procedure: HERNIA REPAIR VENTRAL ADULT;  Surgeon: Jovita Kussmaul, MD;  Location: [facility];  Service: General;  Laterality: N/A;    Allergies: Aspirin; Banana; Hydrocodone; Neomycin-polymyxin-gramicidin; Penicillins; Tramadol; Voltaren [diclofenac sodium]; and Latex  Medications: Prior to Admission medications   Medication Sig Start Date End Date Taking? Authorizing Provider  atorvastatin (LIPITOR) 40 MG tablet TAKE 1 TABLET BY MOUTH EVERY DAY Patient taking differently: Take 40 mg by mouth at bedtime.  01/05/17  Yes Lauree Chandler, NP  celecoxib (CELEBREX) 200 MG capsule Take 200 mg by  mouth 2 (two) times daily. 04/20/17  Yes [provider]  diphenhydrAMINE (BENADRYL) 25 MG tablet Take 25 mg by mouth at bedtime as needed for itching.   Yes [provider]  furosemide (LASIX) 20 MG tablet Take 20 mg by mouth every morning.   Yes [provider]  insulin NPH Human (HUMULIN N,NOVOLIN N) 100 UNIT/ML injection Inject 76-98 Units into the skin See admin instructions. Injects 98 units before breakfast and 76 units before supper.   Yes [provider]  metFORMIN (GLUCOPHAGE) 500 MG tablet Take 1 tablet (500 mg total) by mouth daily with breakfast. Patient taking differently: Take 500 mg by mouth 2 (two) times daily with a meal.  07/26/17  Yes Mikhail, [place], DO  metoprolol succinate (TOPROL-XL) 50 MG 24 hr tablet TAKE 1 TABLET BY MOUTH ONCE DAILY Patient taking differently: Take 50 mg by mouth daily.  06/08/16  Yes Reed, Tiffany L, DO  omeprazole (PRILOSEC) 20 MG capsule TAKE 1 CAPSULE BY MOUTH DAILY Patient taking differently: Take 20 mg by mouth daily.  03/08/17  Yes Reed, Tiffany L, DO  potassium chloride SA (K-DUR,KLOR-CON) 20 MEQ tablet TAKE 1 TABLET(20 MEQ) BY MOUTH DAILY Patient taking differently: Take 20 mEq by mouth daily.  06/03/16  Yes Reed, Tiffany L, DO  glucose blood (ACCU-CHEK AVIVA PLUS) test strip Ell.65 check blood sugar twice daily as directed 01/24/16   Reed, Tiffany L, DO  Insulin Pen Needle (BD PEN NEEDLE NANO U/F) 32G X 4 MM MISC Use once daily with the administration of Toujeo DX E11.65 02/20/16   Reed, Tiffany L, DO     Family History  Problem Relation Age of Onset  . Heart disease Mother   . Heart disease Father   . Heart disease Brother   . Cancer Brother   . Cancer Brother   . Hypertension Sister   . Heart disease Sister     Social History   Socioeconomic History  . Marital status: Married    Spouse name: Not on file  . Number of children: Not on file  . Years of education: Not on file  . Highest education  level: Not on file  Occupational History  . Not on file  Social Needs  . Financial resource strain: Not on file  . Food insecurity:    Worry: Not on file    Inability: Not on file  . Transportation needs:    Medical: Not on file    Non-medical: Not on file  Tobacco Use  . Smoking status: Never Smoker  . Smokeless tobacco: Never Used  Substance and Sexual Activity  . Alcohol use: No  . Drug use: No  . Sexual activity: Yes  Lifestyle  . Physical activity:    Days per week: Not on file    Minutes per session: Not on file  . Stress: Not on file  Relationships  . Social connections:    Talks on phone: Not on file    Gets together: Not on file    Attends religious service: Not on file    Active member of club or organization: Not on file    Attends meetings  of clubs or organizations: Not on file    Relationship status: Not on file  Other Topics Concern  . Not on file  Social History Narrative   One sister with chronic progressive disease requiring a trach, and 24/7 care   One sister with an abrupt hospitalization for a critical condition     Review of Systems: A 12 point ROS discussed and pertinent positives are indicated in the HPI above.  All other systems are negative.  Review of Systems  Constitutional: Negative for fatigue and fever.  Respiratory: Negative for cough and shortness of breath.   Cardiovascular: Negative for chest pain.  Gastrointestinal: Positive for abdominal pain. Negative for nausea and vomiting.  Genitourinary: Negative for dysuria.  Musculoskeletal: Negative for back pain.  Psychiatric/Behavioral: Negative for behavioral problems and confusion.    Vital Signs: BP 138/80   Pulse 73   Temp 97.7 F (36.5 C) (Oral)   Resp 18   Ht 5\' 4"  (1.626 m)   Wt 264 lb 8.8 oz (120 kg)   LMP 07/02/1973   SpO2 96%   BMI 45.41 kg/m   Physical Exam  Constitutional: She is oriented to person, place, and time. She appears well-developed. No distress.    Cardiovascular: Normal rate, regular rhythm and normal heart sounds. Exam reveals no gallop and no friction rub.  No murmur heard. Pulmonary/Chest: Effort normal and breath sounds normal. No respiratory distress.  Abdominal: Soft. She exhibits no distension. There is no tenderness. There is no guarding.  Neurological: She is alert and oriented to person, place, and time.  Skin: Skin is warm and dry. She is not diaphoretic.  Psychiatric: She has a normal mood and affect. Her behavior is normal. Judgment and thought content normal.  Nursing note and vitals reviewed.    MD Evaluation Airway: WNL Heart: WNL Abdomen: WNL Chest/ Lungs: WNL ASA  Classification: 3 Mallampati/Airway Score: Two   Imaging: Ct Abdomen Pelvis Wo Contrast  Result Date: 11/02/2017 CLINICAL DATA:  Left lower quadrant pain, post hernia repair May 2019. EXAM: CT ABDOMEN AND PELVIS WITHOUT CONTRAST TECHNIQUE: Multidetector CT imaging of the abdomen and pelvis was performed following the standard protocol without IV contrast. COMPARISON:  08/01/2017 FINDINGS: Lower chest: Coronary artery calcifications. Heart is normal size. No effusions. Hepatobiliary: There is pneumobilia. Prior cholecystectomy. No focal hepatic abnormality. Pancreas: No focal abnormality or ductal dilatation. Spleen: No focal abnormality.  Normal size. Adrenals/Urinary Tract: Low-density nodule in the left adrenal gland compatible with adenoma. No stones or hydronephrosis. Urinary bladder unremarkable. Stomach/Bowel: Left colonic diverticulosis. No active diverticulitis. No evidence of bowel obstruction. Vascular/Lymphatic: Moderate aortic and iliac calcifications. No aneurysm or adenopathy. Reproductive: Prior hysterectomy. 4.3 cm cystic area within the left ovary with peripheral calcifications, stable since prior study. No right adnexal mass. Other: No free fluid or free air. There is a large fluid collection in the midline within the anterior subcutaneous  soft tissues, in the area of prior surgical drain. This measures 12.7 x 12.1 x 5.6 cm. There are small locules of gas in the anterior subcutaneous soft tissues removed from this midline fluid collection, presumably related to subcutaneous injections. Musculoskeletal: No acute bony abnormality. IMPRESSION: Large midline subcutaneous fluid collection in the area of prior surgical drain measuring up to 12.7 cm, presumably postoperative fluid collection/seroma. No visible recurrent hernia. Prior cholecystectomy.  Stable pneumobilia. Aortic atherosclerosis. Stable left ovarian cystic lesion. Electronically Signed   By: Rolm Baptise M.D.   On: 11/02/2017 09:42    Labs:  CBC: Recent Labs    08/01/17 1555 08/01/17 1819 08/02/17 0722 08/04/17 0418  WBC 19.0* 17.5* 21.5* 11.5*  HGB 13.4 13.3 12.8 12.1  HCT 40.5 39.7 38.2 36.0  PLT 320 291 272 350    COAGS: Recent Labs    11/17/17 0620  INR 0.98    BMP: Recent Labs    08/01/17 1555 08/02/17 0722 08/03/17 0425 08/04/17 0418  NA 136 136 140 140  K 4.2 4.0 4.0 3.7  CL 99* 101 105 104  CO2 26 24 25 25   GLUCOSE 185* 202* 196* 192*  BUN 10 12 15 16   CALCIUM 8.1* 8.1* 8.2* 8.3*  CREATININE 1.08* 0.97 0.88 0.85  GFRNONAA 51* 58* >60 >60  GFRAA 59* >60 >60 >60    LIVER FUNCTION TESTS: Recent Labs    07/23/17 0506 07/27/17 1039 07/28/17 0612 08/02/17 0722  BILITOT 1.0 1.4* 0.9 1.0  AST 20 37 22 19  ALT 17 34 28 21  ALKPHOS 68 83 66 73  PROT 6.7 7.8 6.6 6.2*  ALBUMIN 3.4* 3.8 3.1* 2.4*    TUMOR MARKERS: No results for input(s): AFPTM, CEA, CA199, CHROMGRNA in the last 8760 hours.  Assessment and Plan: Patient with past medical history of hernia surgery presents with complaint of seroma.  IR consulted for aspiration and drainage at the request of Dr.Toth. Case reviewed by Dr. Kathlene Cote who approves patient for procedure.  Patient presents today in their usual state of health.  She has been NPO and is not currently on blood  thinners.  She understands that a drain may be left behind if the collection is large or appears infectious.    Risks and benefits discussed with the patient including bleeding, infection, damage to adjacent structures, and sepsis.  All of the patient's questions were answered, patient is agreeable to proceed. Consent signed and in chart.  Thank you for this interesting consult.  I greatly enjoyed meeting Debra [name] and look forward to participating in their care.  A copy of this report was sent to the requesting provider on this date.  Electronically Signed: Docia Barrier, PA 11/17/2017, 7:50 AM   I spent a total of  30 Minutes   in face to face in clinical consultation, greater than 50% of which was counseling/coordinating care for seroma.

## 2017-11-22 LAB — AEROBIC/ANAEROBIC CULTURE (SURGICAL/DEEP WOUND): CULTURE: NO GROWTH

## 2017-11-22 LAB — AEROBIC/ANAEROBIC CULTURE W GRAM STAIN (SURGICAL/DEEP WOUND)

## 2018-04-29 ENCOUNTER — Encounter (HOSPITAL_BASED_OUTPATIENT_CLINIC_OR_DEPARTMENT_OTHER): Payer: Medicare HMO | Attending: Internal Medicine

## 2018-09-07 ENCOUNTER — Encounter (HOSPITAL_BASED_OUTPATIENT_CLINIC_OR_DEPARTMENT_OTHER): Payer: Medicare HMO | Attending: Internal Medicine

## 2018-09-07 DIAGNOSIS — E11622 Type 2 diabetes mellitus with other skin ulcer: Secondary | ICD-10-CM | POA: Insufficient documentation

## 2018-09-07 DIAGNOSIS — I10 Essential (primary) hypertension: Secondary | ICD-10-CM | POA: Diagnosis not present

## 2018-09-07 DIAGNOSIS — G4730 Sleep apnea, unspecified: Secondary | ICD-10-CM | POA: Diagnosis not present

## 2018-09-07 DIAGNOSIS — E11621 Type 2 diabetes mellitus with foot ulcer: Secondary | ICD-10-CM | POA: Insufficient documentation

## 2018-09-07 DIAGNOSIS — Z794 Long term (current) use of insulin: Secondary | ICD-10-CM | POA: Diagnosis not present

## 2018-09-07 DIAGNOSIS — L97522 Non-pressure chronic ulcer of other part of left foot with fat layer exposed: Secondary | ICD-10-CM | POA: Insufficient documentation

## 2018-09-07 DIAGNOSIS — E1140 Type 2 diabetes mellitus with diabetic neuropathy, unspecified: Secondary | ICD-10-CM | POA: Insufficient documentation

## 2018-09-07 DIAGNOSIS — I872 Venous insufficiency (chronic) (peripheral): Secondary | ICD-10-CM | POA: Diagnosis not present

## 2018-09-07 DIAGNOSIS — L97812 Non-pressure chronic ulcer of other part of right lower leg with fat layer exposed: Secondary | ICD-10-CM | POA: Insufficient documentation

## 2018-09-07 DIAGNOSIS — L97822 Non-pressure chronic ulcer of other part of left lower leg with fat layer exposed: Secondary | ICD-10-CM | POA: Insufficient documentation

## 2018-09-08 DIAGNOSIS — E11621 Type 2 diabetes mellitus with foot ulcer: Secondary | ICD-10-CM | POA: Diagnosis not present

## 2018-09-14 DIAGNOSIS — E11621 Type 2 diabetes mellitus with foot ulcer: Secondary | ICD-10-CM | POA: Diagnosis not present

## 2018-09-21 ENCOUNTER — Encounter (HOSPITAL_BASED_OUTPATIENT_CLINIC_OR_DEPARTMENT_OTHER): Payer: Medicare HMO | Attending: Physician Assistant

## 2018-09-21 DIAGNOSIS — G4730 Sleep apnea, unspecified: Secondary | ICD-10-CM | POA: Diagnosis not present

## 2018-09-21 DIAGNOSIS — E1140 Type 2 diabetes mellitus with diabetic neuropathy, unspecified: Secondary | ICD-10-CM | POA: Insufficient documentation

## 2018-09-21 DIAGNOSIS — L97522 Non-pressure chronic ulcer of other part of left foot with fat layer exposed: Secondary | ICD-10-CM | POA: Insufficient documentation

## 2018-09-21 DIAGNOSIS — E11621 Type 2 diabetes mellitus with foot ulcer: Secondary | ICD-10-CM | POA: Diagnosis present

## 2018-09-21 DIAGNOSIS — I872 Venous insufficiency (chronic) (peripheral): Secondary | ICD-10-CM | POA: Diagnosis not present

## 2018-09-21 DIAGNOSIS — I10 Essential (primary) hypertension: Secondary | ICD-10-CM | POA: Diagnosis not present

## 2018-09-28 DIAGNOSIS — E11621 Type 2 diabetes mellitus with foot ulcer: Secondary | ICD-10-CM | POA: Diagnosis not present

## 2018-10-05 DIAGNOSIS — E11621 Type 2 diabetes mellitus with foot ulcer: Secondary | ICD-10-CM | POA: Diagnosis not present

## 2018-10-12 DIAGNOSIS — E11621 Type 2 diabetes mellitus with foot ulcer: Secondary | ICD-10-CM | POA: Diagnosis not present

## 2018-10-19 DIAGNOSIS — E11621 Type 2 diabetes mellitus with foot ulcer: Secondary | ICD-10-CM | POA: Diagnosis not present

## 2018-10-26 ENCOUNTER — Encounter (HOSPITAL_BASED_OUTPATIENT_CLINIC_OR_DEPARTMENT_OTHER): Payer: Medicare HMO | Attending: Internal Medicine

## 2018-10-26 DIAGNOSIS — L97822 Non-pressure chronic ulcer of other part of left lower leg with fat layer exposed: Secondary | ICD-10-CM | POA: Insufficient documentation

## 2018-10-26 DIAGNOSIS — G4730 Sleep apnea, unspecified: Secondary | ICD-10-CM | POA: Insufficient documentation

## 2018-10-26 DIAGNOSIS — E1140 Type 2 diabetes mellitus with diabetic neuropathy, unspecified: Secondary | ICD-10-CM | POA: Insufficient documentation

## 2018-10-26 DIAGNOSIS — E11621 Type 2 diabetes mellitus with foot ulcer: Secondary | ICD-10-CM | POA: Insufficient documentation

## 2018-10-26 DIAGNOSIS — I10 Essential (primary) hypertension: Secondary | ICD-10-CM | POA: Diagnosis not present

## 2018-10-26 DIAGNOSIS — L97522 Non-pressure chronic ulcer of other part of left foot with fat layer exposed: Secondary | ICD-10-CM | POA: Diagnosis not present

## 2018-10-26 DIAGNOSIS — I872 Venous insufficiency (chronic) (peripheral): Secondary | ICD-10-CM | POA: Insufficient documentation

## 2018-11-02 DIAGNOSIS — E11621 Type 2 diabetes mellitus with foot ulcer: Secondary | ICD-10-CM | POA: Diagnosis not present

## 2018-11-09 DIAGNOSIS — E11621 Type 2 diabetes mellitus with foot ulcer: Secondary | ICD-10-CM | POA: Diagnosis not present

## 2018-11-09 IMAGING — CT CT ABD-PELV W/ CM
2 of 5 series · 16 of 46 positions shown, 18 images · IV contrast (ISOVUE)
Comparison: CT scan of February 15, 2016.

CLINICAL DATA: Acute left lower quadrant abdominal pain.

EXAM:
CT ABDOMEN AND PELVIS WITH CONTRAST
TECHNIQUE: Multidetector CT imaging of the abdomen and pelvis was performed
using the standard protocol following bolus administration of
intravenous contrast.
CONTRAST:  80mL STAFKJ-EVV IOPAMIDOL (STAFKJ-EVV) INJECTION 61%

[Series 2: axial st · axial · 0.88mm/px · z∈[-439,-54]mm · 13 of 91 slices shown, 15 images]
[im 7/91  soft-tissue]
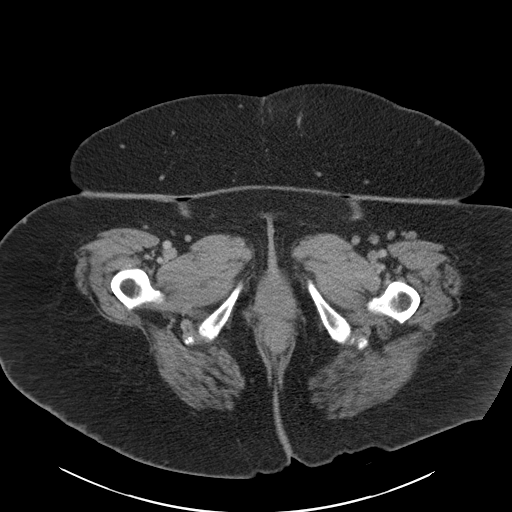
[im 7/91  bone]
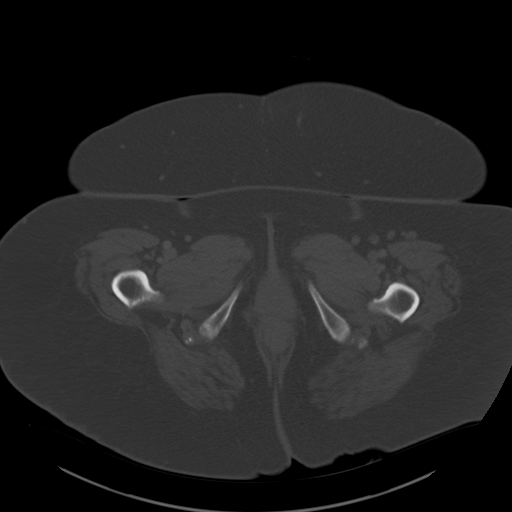
[im 13/91  soft-tissue]
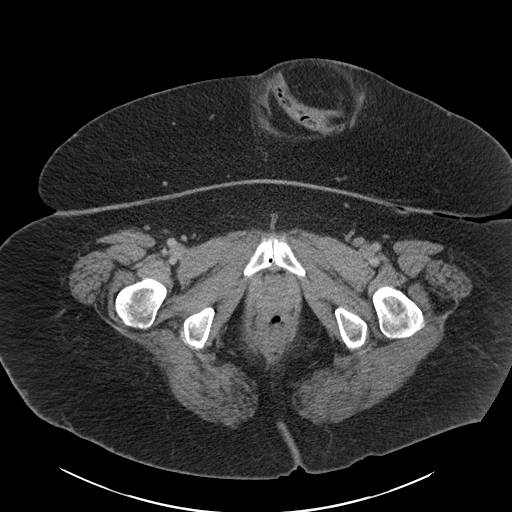
[im 20/91  soft-tissue]
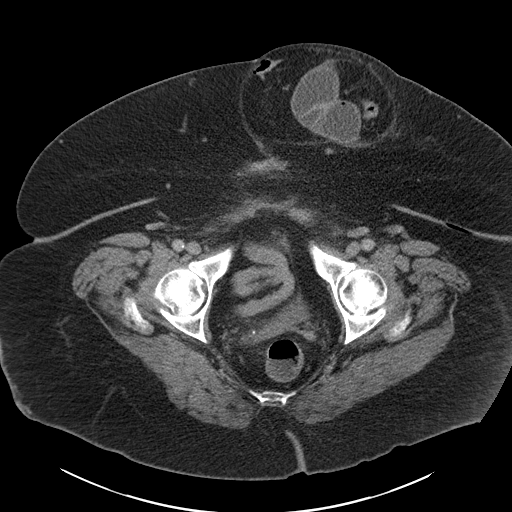
[im 26/91  soft-tissue]
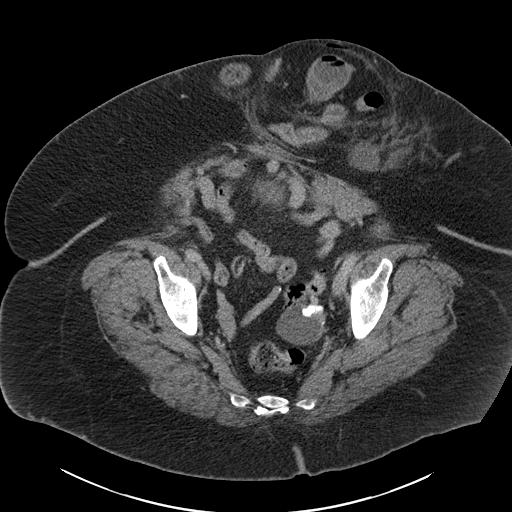
[im 33/91  soft-tissue]
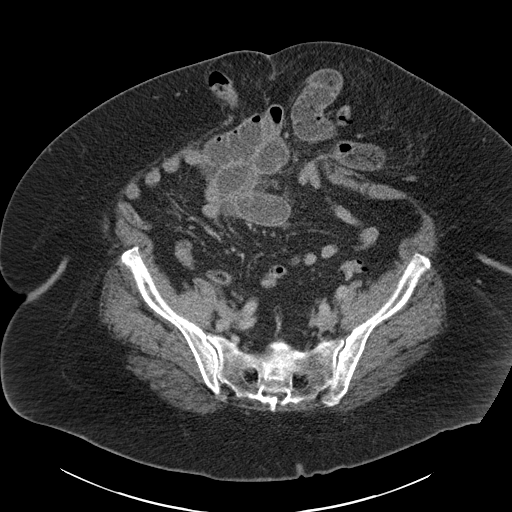
[im 39/91  soft-tissue]
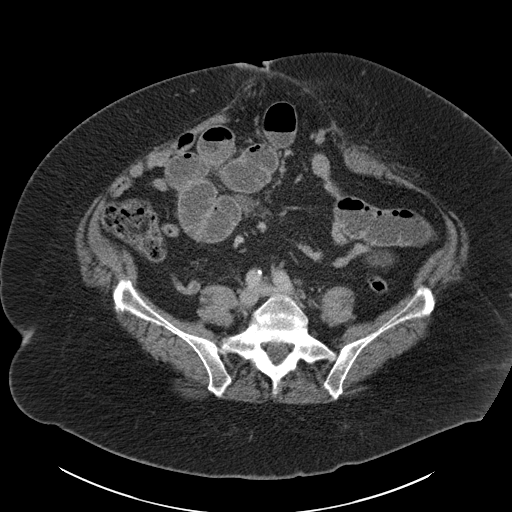
[im 46/91  soft-tissue]
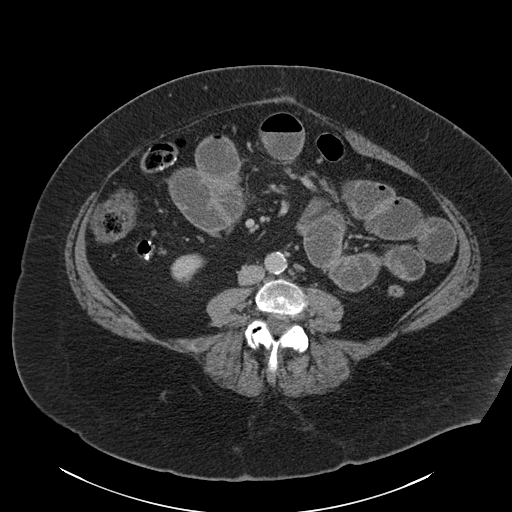
[im 52/91  soft-tissue]
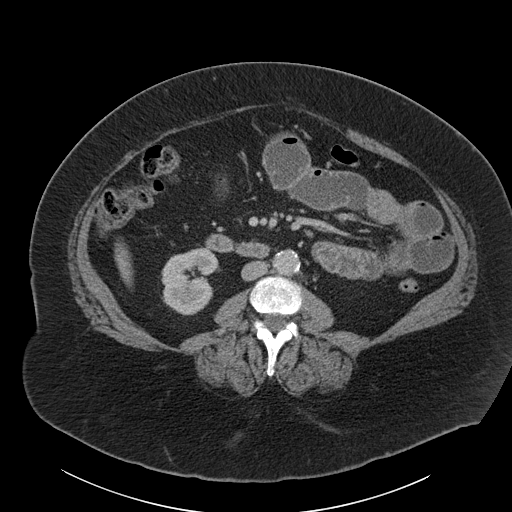
[im 58/91  soft-tissue]
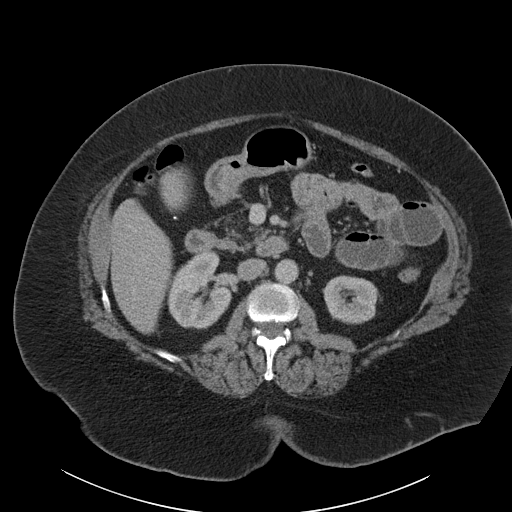
[im 58/91  bone]
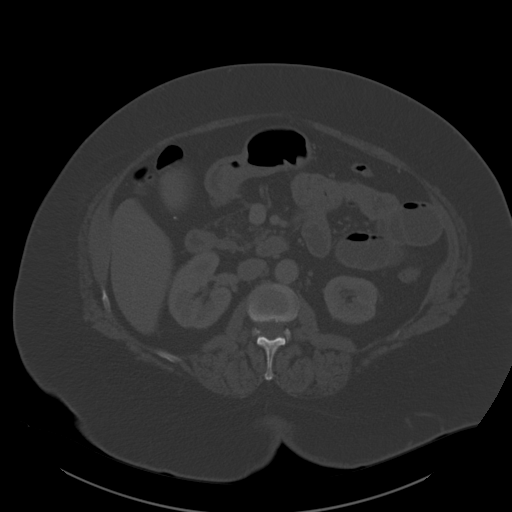
[im 65/91  soft-tissue]
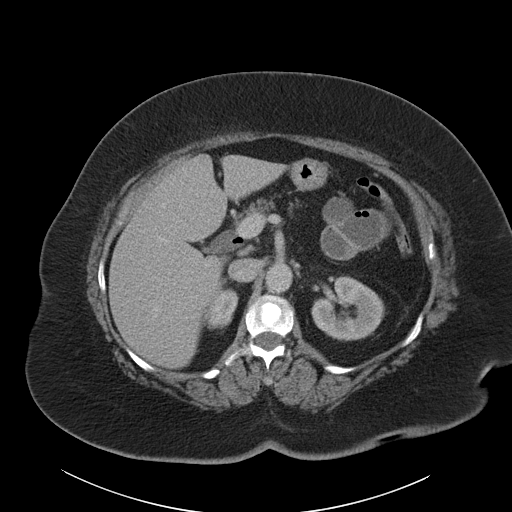
[im 71/91  soft-tissue]
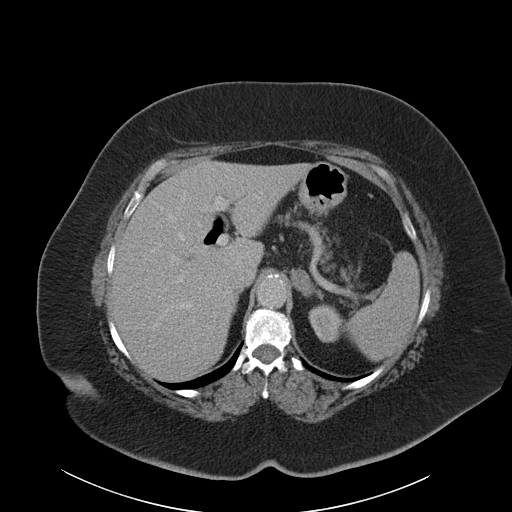
[im 78/91  soft-tissue]
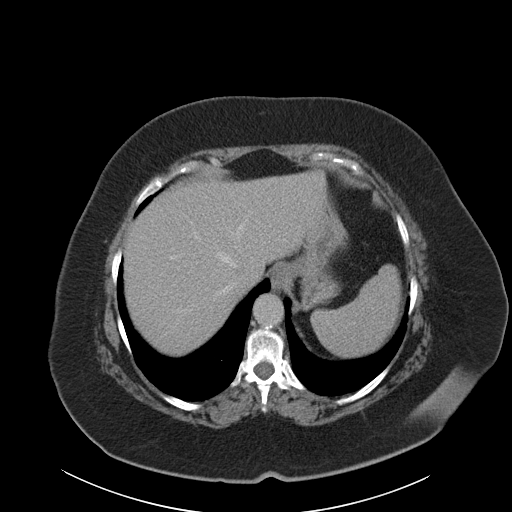
[im 84/91  soft-tissue]
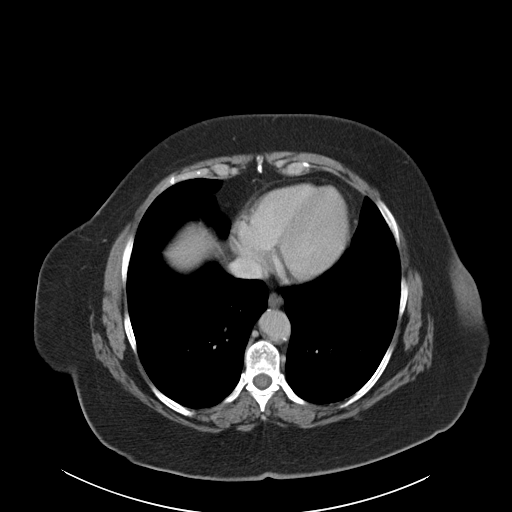

[Series 4: coronal st · coronal · 0.90mm/px · 3 of 133 slices shown]
[im 45/133  soft-tissue]
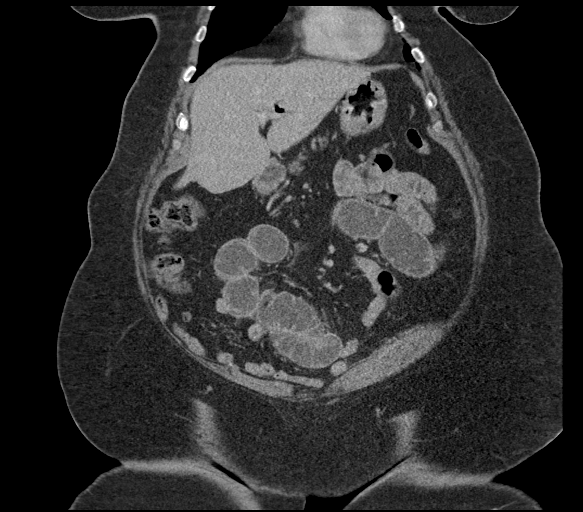
[im 59/133  soft-tissue]
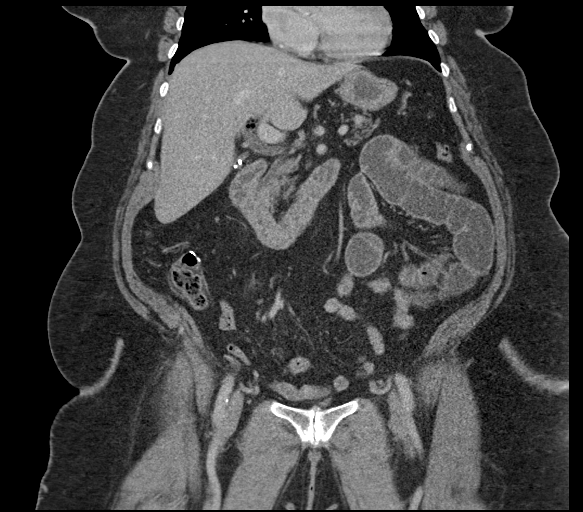
[im 74/133  soft-tissue]
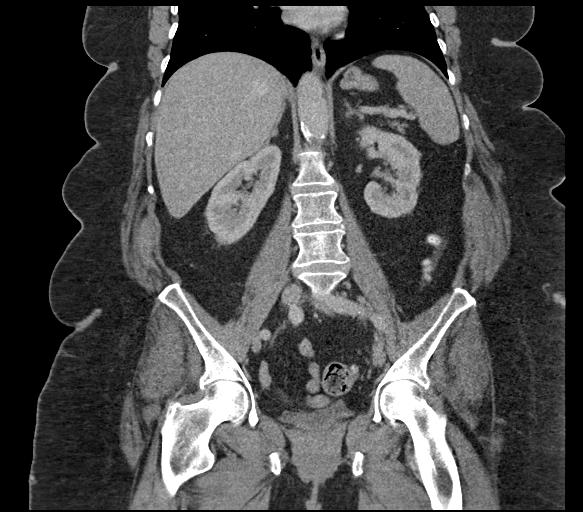

[16 of 46 positions shown; findings below may reference images not displayed]

FINDINGS: Lower chest: No acute abnormality.

Hepatobiliary: Status post cholecystectomy. Stable left-sided
pneumobilia is noted. Liver is otherwise unremarkable. No biliary
dilatation is noted.

Pancreas: Unremarkable. No pancreatic ductal dilatation or
surrounding inflammatory changes.

Spleen: Normal in size without focal abnormality.

Adrenals/Urinary Tract: Stable left adrenal adenoma. Right adrenal
gland appears normal. No hydronephrosis or renal obstruction is
noted. No renal or ureteral calculi are noted. Urinary bladder is
decompressed.

Stomach/Bowel: The stomach appears normal. Status post appendectomy.
Diverticulosis of descending and sigmoid colon is noted without
inflammation. Large ventral hernia is noted in the pelvis which
contains loops of large and small bowel. Transverse colon is
nondilated and there for does not appear to be incarcerated.
However, there does appear to be dilatation of small-bowel loops
within the hernia, as well as the loops proximal to the hernia
secondary to obstruction.

Vascular/Lymphatic: Aortic atherosclerosis. No enlarged abdominal or
pelvic lymph nodes.

Reproductive: Status post hysterectomy. No adnexal masses.

Other: No abnormal fluid collection is noted.

Musculoskeletal: No acute or significant osseous findings.
IMPRESSION: Large ventral hernia is noted in the pelvis which contains loops of
large and small bowel. It appears to be causing obstruction of the
more proximal small bowel as well as dilatation of the small bowel
within the hernia. No large bowel dilatation is noted.

Aortic Atherosclerosis ([id number]-O08.8).

## 2018-11-16 DIAGNOSIS — E11621 Type 2 diabetes mellitus with foot ulcer: Secondary | ICD-10-CM | POA: Diagnosis not present

## 2018-11-23 ENCOUNTER — Encounter (HOSPITAL_BASED_OUTPATIENT_CLINIC_OR_DEPARTMENT_OTHER): Payer: Medicare HMO | Attending: Physician Assistant

## 2018-11-23 DIAGNOSIS — G4730 Sleep apnea, unspecified: Secondary | ICD-10-CM | POA: Diagnosis not present

## 2018-11-23 DIAGNOSIS — L97522 Non-pressure chronic ulcer of other part of left foot with fat layer exposed: Secondary | ICD-10-CM | POA: Insufficient documentation

## 2018-11-23 DIAGNOSIS — I10 Essential (primary) hypertension: Secondary | ICD-10-CM | POA: Insufficient documentation

## 2018-11-23 DIAGNOSIS — E11621 Type 2 diabetes mellitus with foot ulcer: Secondary | ICD-10-CM | POA: Insufficient documentation

## 2018-11-23 DIAGNOSIS — E1140 Type 2 diabetes mellitus with diabetic neuropathy, unspecified: Secondary | ICD-10-CM | POA: Diagnosis not present

## 2018-11-23 DIAGNOSIS — I872 Venous insufficiency (chronic) (peripheral): Secondary | ICD-10-CM | POA: Diagnosis not present

## 2018-11-23 DIAGNOSIS — E11622 Type 2 diabetes mellitus with other skin ulcer: Secondary | ICD-10-CM | POA: Diagnosis not present

## 2018-11-23 DIAGNOSIS — L97822 Non-pressure chronic ulcer of other part of left lower leg with fat layer exposed: Secondary | ICD-10-CM | POA: Diagnosis not present

## 2018-11-25 ENCOUNTER — Other Ambulatory Visit: Payer: Self-pay

## 2018-11-30 DIAGNOSIS — E11621 Type 2 diabetes mellitus with foot ulcer: Secondary | ICD-10-CM | POA: Diagnosis not present

## 2018-12-07 DIAGNOSIS — E11621 Type 2 diabetes mellitus with foot ulcer: Secondary | ICD-10-CM | POA: Diagnosis not present

## 2018-12-14 DIAGNOSIS — E11621 Type 2 diabetes mellitus with foot ulcer: Secondary | ICD-10-CM | POA: Diagnosis not present

## 2019-04-14 ENCOUNTER — Ambulatory Visit (HOSPITAL_COMMUNITY)
Admission: EM | Admit: 2019-04-14 | Discharge: 2019-04-14 | Disposition: A | Discharge status: Home / Self Care | Payer: Medicare Other | Attending: Family Medicine | Admitting: Family Medicine

## 2019-04-14 ENCOUNTER — Encounter (HOSPITAL_COMMUNITY): Payer: Self-pay | Admitting: Emergency Medicine

## 2019-04-14 ENCOUNTER — Other Ambulatory Visit: Payer: Self-pay

## 2019-04-14 ENCOUNTER — Ambulatory Visit (INDEPENDENT_AMBULATORY_CARE_PROVIDER_SITE_OTHER): Payer: Medicare Other

## 2019-04-14 DIAGNOSIS — L089 Local infection of the skin and subcutaneous tissue, unspecified: Secondary | ICD-10-CM

## 2019-04-14 DIAGNOSIS — T148XXA Other injury of unspecified body region, initial encounter: Secondary | ICD-10-CM | POA: Diagnosis not present

## 2019-04-14 MED ORDER — DOXYCYCLINE HYCLATE 100 MG PO CAPS: 100.0000 mg | ORAL_CAPSULE | Freq: Two times a day (BID) | ORAL | 0 refills | Status: DC

## 2019-04-14 NOTE — Discharge Instructions (Addendum)
Cleanse daily with soap and water, let it dry out.  Can use the walking boot as needed for comfort.  Complete course of antibiotics.  Please follow up with your primary care provider and/or the wound center for long term management of this to allow for healing.  Please return for any further concerns or worsening

## 2019-04-14 NOTE — ED Triage Notes (Signed)
Pt here for wound check to left heel; pt sts wound x 1 week and then opened up today; pt is diabetic

## 2019-04-14 NOTE — ED Provider Notes (Signed)
[place]    CSN: [id number] Arrival date & time: 04/14/19  1030      History   Chief Complaint Chief Complaint  Patient presents with  . Wound Check    HPI Debra [name] is a 74 y.o. female.   Debra [name] presents with complaints of wound with pain to heel of left foot. Noted it first on 1/19. Has noted drainage with odor. No fevers. Has had issues with non healing ulcer to plantar aspect of great toe as well. Had been following with podiatry but no longer able to afford this. Follows with her PCP and has appointment next week. DM. Her blood sugars have been elevated she states. History  Of MRSA. She wears compression stockings to help with chronic edema. No known injury to the heel or trauma. She has applied lotion and Band-Aids.    ROS per HPI, negative if not otherwise mentioned.      Past Medical History:  Diagnosis Date  . Cellulitis of left leg    history of, most recent episode 01/09  . Chronic venous insufficiency   . Diabetes mellitus with neurological manifestation ([place])   . Diverticulitis    h/o 2-3 episodes in past  . Hyperlipidemia LDL goal < 100   . Hypertension goal BP (blood pressure) < 130/80   . Leg edema    secondary to chronic venous insufficiency  . Morbid obesity ([name])   . Osteopenia    DEXA scan 7/07    Patient Active Problem List   Diagnosis Date Noted  . Pseudogout of right knee 08/02/2017  . SBO (small bowel obstruction) ([place]) 07/22/2017  . Abnormal LFTs 03/20/2016  . Abdominal pain, left lower quadrant 07/04/2015  . Type 2 diabetes mellitus with diabetic polyneuropathy ([place]) 10/06/2013  . Hyperlipidemia associated with type 2 diabetes mellitus ([place]) 10/06/2013  . Fungal dermatitis 08/11/2013  . Hyperlipidemia 06/09/2013  . Choledocholithiasis with acute cholecystitis 04/14/2013  . Symptomatic cholelithiasis 04/13/2013  . Neuropathic pain of both legs 04/04/2013  . Severe obesity (BMI >= 40) ([name]) 11/28/2012  .  Obesity (BMI 30-39.9) 11/28/2012  . Diabetes mellitus with neurological manifestation ([name])   . Hypertension goal BP (blood pressure) < 130/80   . Osteopenia   . Chronic venous insufficiency   . Leg edema   . Hypertensive retinopathy, grade 2 03/30/2012  . Medial epicondylitis 02/11/2012  . Superficial thrombophlebitis of left leg 07/15/2011  . GERD (gastroesophageal reflux disease) 02/25/2011  . Right knee pain 02/16/2011  . Preventive measure 09/11/2010  . Neck pain 07/03/2010  . OSTEOPENIA 04/12/2006  . Type 2 diabetes, uncontrolled, with neuropathy ([name]) 03/08/2006  . MORBID OBESITY 03/08/2006  . Essential hypertension 03/08/2006    Past Surgical History:  Procedure Laterality Date  . ABDOMINAL HYSTERECTOMY    . APPENDECTOMY    . CHOLECYSTECTOMY    . CHOLECYSTECTOMY N/A 04/14/2013   Procedure: LAPAROSCOPIC CHOLECYSTECTOMY WITH INTRAOPERATIVE CHOLANGIOGRAM;  Surgeon: Adin Hector, MD;  Location: [facility];  Service: General;  Laterality: N/A;  . COLECTOMY     with diverting colsotmy and revision in the 1990's for diverticulitis  . ERCP N/A 04/17/2013   Procedure: ENDOSCOPIC RETROGRADE CHOLANGIOPANCREATOGRAPHY (ERCP);  Surgeon: Irene Shipper, MD;  Location: [facility];  Service: Endoscopy;  Laterality: N/A;  note pt want general anesthesia for this case.  preferto perform in endo unit, but if unavailable please arrange time in OR  . EYE SURGERY  8/13   left cataract removal & retinal  repair  . INSERTION OF MESH N/A 07/30/2017   Procedure: INSERTION OF MESH;  Surgeon: Jovita Kussmaul, MD;  Location: [facility];  Service: General;  Laterality: N/A;  . LAPAROSCOPIC LYSIS OF ADHESIONS N/A 04/14/2013   Procedure: LAPAROSCOPIC LYSIS OF ADHESIONS;  Surgeon: Adin Hector, MD;  Location: [facility];  Service: General;  Laterality: N/A;  . LAPAROTOMY N/A 07/30/2017   Procedure: EXPLORATORY LAPAROTOMY;  Surgeon: Jovita Kussmaul, MD;  Location: [facility];  Service: General;  Laterality: N/A;  . LYSIS  OF ADHESION N/A 07/30/2017   Procedure: EXTENSIVE LYSIS OF ADHESION;  Surgeon: Jovita Kussmaul, MD;  Location: [facility];  Service: General;  Laterality: N/A;  . OMENTECTOMY N/A 07/30/2017   Procedure: PARTIAL OMENTECTOMY;  Surgeon: Jovita Kussmaul, MD;  Location: [facility];  Service: General;  Laterality: N/A;  . VENTRAL HERNIA REPAIR N/A 07/30/2017   Procedure: HERNIA REPAIR VENTRAL ADULT;  Surgeon: Jovita Kussmaul, MD;  Location: [facility];  Service: General;  Laterality: N/A;    OB History   No obstetric history on file.      Home Medications    Prior to Admission medications   Medication Sig Start Date End Date Taking? Authorizing Provider  atorvastatin (LIPITOR) 40 MG tablet TAKE 1 TABLET BY MOUTH EVERY DAY Patient taking differently: Take 40 mg by mouth at bedtime.  01/05/17   Lauree Chandler, NP  celecoxib (CELEBREX) 200 MG capsule Take 200 mg by mouth 2 (two) times daily. 04/20/17   [provider]  diphenhydrAMINE (BENADRYL) 25 MG tablet Take 25 mg by mouth at bedtime as needed for itching.    [provider]  doxycycline (VIBRAMYCIN) 100 MG capsule Take 1 capsule (100 mg total) by mouth 2 (two) times daily. 04/14/19   Zigmund Gottron, NP  furosemide (LASIX) 20 MG tablet Take 20 mg by mouth every morning.    [provider]  glucose blood (ACCU-CHEK AVIVA PLUS) test strip Ell.65 check blood sugar twice daily as directed 01/24/16   Reed, Tiffany L, DO  insulin NPH Human (HUMULIN N,NOVOLIN N) 100 UNIT/ML injection Inject 76-98 Units into the skin See admin instructions. Injects 98 units before breakfast and 76 units before supper.    [provider]  Insulin Pen Needle (BD PEN NEEDLE NANO U/F) 32G X 4 MM MISC Use once daily with the administration of [place] DX E11.65 02/20/16   Reed, Tiffany L, DO  metFORMIN (GLUCOPHAGE) 500 MG tablet Take 1 tablet (500 mg total) by mouth daily with breakfast. Patient taking differently: Take 500 mg by mouth 2 (two) times  daily with a meal.  07/26/17   Mikhail, Velta Addison, DO  metoprolol succinate (TOPROL-XL) 50 MG 24 hr tablet TAKE 1 TABLET BY MOUTH ONCE DAILY Patient taking differently: Take 50 mg by mouth daily.  06/08/16   Reed, Tiffany L, DO  omeprazole (PRILOSEC) 20 MG capsule TAKE 1 CAPSULE BY MOUTH DAILY Patient taking differently: Take 20 mg by mouth daily.  03/08/17   Reed, Tiffany L, DO  pioglitazone (ACTOS) 15 MG tablet Take 15 mg by mouth daily. 03/21/19   [provider]  potassium chloride SA (K-DUR,KLOR-CON) 20 MEQ tablet TAKE 1 TABLET(20 MEQ) BY MOUTH DAILY Patient taking differently: Take 20 mEq by mouth daily.  06/03/16   Reed, Tiffany L, DO  valsartan (DIOVAN) 320 MG tablet Take 320 mg by mouth daily. 03/21/19   [provider]    Family History Family History  Problem Relation Age  of Onset  . Heart disease Mother   . Heart disease Father   . Heart disease Brother   . Cancer Brother   . Cancer Brother   . Hypertension Sister   . Heart disease Sister     Social History Social History   Tobacco Use  . Smoking status: Never Smoker  . Smokeless tobacco: Never Used  Substance Use Topics  . Alcohol use: No  . Drug use: No     Allergies   Aspirin, Banana, Hydrocodone, Neomycin-polymyxin-gramicidin, Penicillins, Tramadol, Voltaren [diclofenac sodium], and Latex   Review of Systems Review of Systems   Physical Exam Triage Vital Signs ED Triage Vitals [04/14/19 1113]  Enc Vitals Group     BP (!) 159/103     Pulse Rate 88     Resp 18     Temp 98.7 F (37.1 C)     Temp Source Oral     SpO2 100 %     Weight      Height      Head Circumference      Peak Flow      Pain Score 10     Pain Loc      Pain Edu?      Excl. in [place]?    No data found.  Updated Vital Signs BP (!) 159/103 (BP Location: Right Arm)   Pulse 88   Temp 98.7 F (37.1 C) (Oral)   Resp 18   LMP 07/02/1973   SpO2 100%   Visual Acuity Right Eye Distance:   Left Eye Distance:     Bilateral Distance:    Right Eye Near:   Left Eye Near:    Bilateral Near:     Physical Exam Constitutional:      General: She is not in acute distress.    Appearance: She is well-developed.  Cardiovascular:     Rate and Rhythm: Normal rate.     Comments: Swelling with redness to bilateral lower legs; open wound noted to anterior left lower leg Pulmonary:     Effort: Pulmonary effort is normal.  Musculoskeletal:     Right lower leg: 2+ Edema present.     Left lower leg: 2+ Edema present.       Feet:  Feet:     Comments: Ulcer to plantar aspect of left great toe; open wound to heal, hole, approximately 0.5cm in depth; blanched callus skin surrounding with foot redness and swelling noted; see photo  Skin:    General: Skin is warm and dry.  Neurological:     Mental Status: She is alert and oriented to person, place, and time.          UC Treatments / Results  Labs (all labs ordered are listed, but only abnormal results are displayed) Labs Reviewed - No data to display  EKG   Radiology DG Foot Complete Left  Result Date: 04/14/2019 CLINICAL DATA:  Diabetic soft tissue ulceration of the heel. Redness and swelling. EXAM: LEFT FOOT - COMPLETE 3+ VIEW COMPARISON:  Radiographs dated 01/18/2015 FINDINGS: There is a soft tissue ulcer on the plantar aspect of the heel with a gas in the soft tissues. There is no visible underlying osteomyelitis of the calcaneus. There is a hallux valgus deformity with moderate arthritis of the first MTP joint, without significant change since the prior exam. No acute bone abnormalities. Chronic soft tissue swelling of the foot and around the ankle. IMPRESSION: 1. Soft tissue ulcer of the plantar aspect of  the heel. No radiographic evidence of osteomyelitis. 2. Chronic soft tissue swelling. Electronically Signed   By: Lorriane Shire M.D.   On: 04/14/2019 12:22    Procedures Procedures (including critical care time)  Medications Ordered in  UC Medications - No data to display  Initial Impression / Assessment and Plan / UC Course  I have reviewed the triage vital signs and the nursing notes.  Pertinent labs & imaging results that were available during my care of the patient were reviewed by me and considered in my medical decision making (see chart for details).     Xray negative for any indication of osteomyelitis. Diabetic with new open wound. No drainage noted today, patient endorses drainage. Doxy provided and encouraged close follow up with PCP and/or wound center. Patient verbalized understanding and agreeable to plan.  Ambulatory out of clinic.     Final Clinical Impressions(s) / UC Diagnoses   Final diagnoses:  Wound infection     Discharge Instructions     Cleanse daily with soap and water, let it dry out.  Can use the walking boot as needed for comfort.  Complete course of antibiotics.  Please follow up with your primary care provider and/or the wound center for long term management of this to allow for healing.  Please return for any further concerns or worsening   ED Prescriptions    Medication Sig Dispense Auth. Provider   doxycycline (VIBRAMYCIN) 100 MG capsule Take 1 capsule (100 mg total) by mouth 2 (two) times daily. 20 capsule Zigmund Gottron, NP     PDMP not reviewed this encounter.   Zigmund Gottron, NP 04/14/19 1614

## 2019-04-22 ENCOUNTER — Encounter (HOSPITAL_COMMUNITY): Payer: Self-pay | Admitting: Emergency Medicine

## 2019-04-22 ENCOUNTER — Emergency Department (HOSPITAL_COMMUNITY): Payer: Medicare Other

## 2019-04-22 ENCOUNTER — Other Ambulatory Visit: Payer: Self-pay

## 2019-04-22 ENCOUNTER — Emergency Department (HOSPITAL_COMMUNITY)
Admission: EM | Admit: 2019-04-22 | Discharge: 2019-04-22 | Discharge status: Against Medical Advice | Payer: Medicare Other | Attending: Emergency Medicine | Admitting: Emergency Medicine

## 2019-04-22 DIAGNOSIS — Z91040 Latex allergy status: Secondary | ICD-10-CM | POA: Insufficient documentation

## 2019-04-22 DIAGNOSIS — Z79899 Other long term (current) drug therapy: Secondary | ICD-10-CM | POA: Insufficient documentation

## 2019-04-22 DIAGNOSIS — Z794 Long term (current) use of insulin: Secondary | ICD-10-CM | POA: Diagnosis not present

## 2019-04-22 DIAGNOSIS — L97529 Non-pressure chronic ulcer of other part of left foot with unspecified severity: Secondary | ICD-10-CM | POA: Diagnosis not present

## 2019-04-22 DIAGNOSIS — E11621 Type 2 diabetes mellitus with foot ulcer: Secondary | ICD-10-CM | POA: Insufficient documentation

## 2019-04-22 DIAGNOSIS — L03116 Cellulitis of left lower limb: Secondary | ICD-10-CM | POA: Insufficient documentation

## 2019-04-22 DIAGNOSIS — M79672 Pain in left foot: Secondary | ICD-10-CM | POA: Diagnosis present

## 2019-04-22 DIAGNOSIS — E11628 Type 2 diabetes mellitus with other skin complications: Secondary | ICD-10-CM

## 2019-04-22 DIAGNOSIS — I10 Essential (primary) hypertension: Secondary | ICD-10-CM | POA: Diagnosis not present

## 2019-04-22 LAB — CBC WITH DIFFERENTIAL/PLATELET
Abs Immature Granulocytes: 0.04 10*3/uL (ref 0.00–0.07)
Basophils Absolute: 0.1 10*3/uL (ref 0.0–0.1)
Basophils Relative: 1 %
Eosinophils Absolute: 0.3 10*3/uL (ref 0.0–0.5)
Eosinophils Relative: 3 %
HCT: 37.5 % (ref 36.0–46.0)
Hemoglobin: 12.2 g/dL (ref 12.0–15.0)
Immature Granulocytes: 1 %
Lymphocytes Relative: 17 %
Lymphs Abs: 1.4 10*3/uL (ref 0.7–4.0)
MCH: 30.1 pg (ref 26.0–34.0)
MCHC: 32.5 g/dL (ref 30.0–36.0)
MCV: 92.6 fL (ref 80.0–100.0)
Monocytes Absolute: 0.7 10*3/uL (ref 0.1–1.0)
Monocytes Relative: 8 %
Neutro Abs: 6 10*3/uL (ref 1.7–7.7)
Neutrophils Relative %: 70 %
Platelets: 301 10*3/uL (ref 150–400)
RBC: 4.05 MIL/uL (ref 3.87–5.11)
RDW: 13.3 % (ref 11.5–15.5)
WBC: 8.4 10*3/uL (ref 4.0–10.5)
nRBC: 0 % (ref 0.0–0.2)

## 2019-04-22 LAB — BASIC METABOLIC PANEL
Anion gap: 9 (ref 5–15)
BUN: 17 mg/dL (ref 8–23)
CO2: 26 mmol/L (ref 22–32)
Calcium: 9.3 mg/dL (ref 8.9–10.3)
Chloride: 103 mmol/L (ref 98–111)
Creatinine, Ser: 0.86 mg/dL (ref 0.44–1.00)
GFR calc Af Amer: >60 mL/min (ref >60)
GFR calc non Af Amer: >60 mL/min (ref >60)
Glucose, Bld: 196 mg/dL — ABNORMAL HIGH (ref 70–99)
Potassium: 4.2 mmol/L (ref 3.5–5.1)
Sodium: 138 mmol/L (ref 135–145)

## 2019-04-22 MED ORDER — CLINDAMYCIN PHOSPHATE 600 MG/50ML IV SOLN
600.0000 mg | Freq: Once | INTRAVENOUS | Status: AC
  Administered 2019-04-22: 10:00:00 600 mg via INTRAVENOUS
  Filled 2019-04-22: qty 50

## 2019-04-22 MED ORDER — CLINDAMYCIN HCL 150 MG PO CAPS: 450.0000 mg | ORAL_CAPSULE | Freq: Three times a day (TID) | ORAL | 0 refills | Status: AC

## 2019-04-22 NOTE — ED Provider Notes (Signed)
[facility] EMERGENCY DEPARTMENT Provider Note   CSN: [id number] Arrival date & time: 04/22/19  0754     History Chief Complaint  Patient presents with  . foot wound    Debra [name] is a 74 y.o. female history of morbid obesity, diabetes, hypertension, hyperlipidemia, GERD.  Patient presents today for wound of the left heel ongoing for greater than 1 month.  She reports a sharp stabbing pain to the area moderate intensity constant worsened with ambulation and without alleviating factors, no radiation of pain.  She reports that she attempted home wound care for the first month but it was present without success.  She was seen at urgent care on 04/14/2019 and was discharged with doxycycline.  She reports compliance with this medication but symptoms have continued to worsen.  She reports she has recently developed swelling to her entire foot and ankle and is unable to wear her compression stockings due to pain and swelling.  She denies any history of fever/chills, fall/injury, numbness/tingling, weakness, arthralgias or any additional concerns.  HPI     Past Medical History:  Diagnosis Date  . Cellulitis of left leg    history of, most recent episode 01/09  . Chronic venous insufficiency   . Diabetes mellitus with neurological manifestation ([place])   . Diverticulitis    h/o 2-3 episodes in past  . Hyperlipidemia LDL goal < 100   . Hypertension goal BP (blood pressure) < 130/80   . Leg edema    secondary to chronic venous insufficiency  . Morbid obesity ([place])   . Osteopenia    DEXA scan 7/07    Patient Active Problem List   Diagnosis Date Noted  . Pseudogout of right knee 08/02/2017  . SBO (small bowel obstruction) ([place]) 07/22/2017  . Abnormal LFTs 03/20/2016  . Abdominal pain, left lower quadrant 07/04/2015  . Type 2 diabetes mellitus with diabetic polyneuropathy ([place]) 10/06/2013  . Hyperlipidemia associated with type 2 diabetes mellitus ([place]) 10/06/2013    . Fungal dermatitis 08/11/2013  . Hyperlipidemia 06/09/2013  . Choledocholithiasis with acute cholecystitis 04/14/2013  . Symptomatic cholelithiasis 04/13/2013  . Neuropathic pain of both legs 04/04/2013  . Severe obesity (BMI >= 40) ([place]) 11/28/2012  . Obesity (BMI 30-39.9) 11/28/2012  . Diabetes mellitus with neurological manifestation ([place])   . Hypertension goal BP (blood pressure) < 130/80   . Osteopenia   . Chronic venous insufficiency   . Leg edema   . Hypertensive retinopathy, grade 2 03/30/2012  . Medial epicondylitis 02/11/2012  . Superficial thrombophlebitis of left leg 07/15/2011  . GERD (gastroesophageal reflux disease) 02/25/2011  . Right knee pain 02/16/2011  . Preventive measure 09/11/2010  . Neck pain 07/03/2010  . OSTEOPENIA 04/12/2006  . Type 2 diabetes, uncontrolled, with neuropathy ([place]) 03/08/2006  . MORBID OBESITY 03/08/2006  . Essential hypertension 03/08/2006    Past Surgical History:  Procedure Laterality Date  . ABDOMINAL HYSTERECTOMY    . APPENDECTOMY    . CHOLECYSTECTOMY    . CHOLECYSTECTOMY N/A 04/14/2013   Procedure: LAPAROSCOPIC CHOLECYSTECTOMY WITH INTRAOPERATIVE CHOLANGIOGRAM;  Surgeon: Adin Hector, MD;  Location: [facility];  Service: General;  Laterality: N/A;  . COLECTOMY     with diverting colsotmy and revision in the 1990's for diverticulitis  . ERCP N/A 04/17/2013   Procedure: ENDOSCOPIC RETROGRADE CHOLANGIOPANCREATOGRAPHY (ERCP);  Surgeon: Irene Shipper, MD;  Location: [facility];  Service: Endoscopy;  Laterality: N/A;  note pt want general anesthesia for this case.  preferto perform  in endo unit, but if unavailable please arrange time in OR  . EYE SURGERY  8/13   left cataract removal & retinal repair  . INSERTION OF MESH N/A 07/30/2017   Procedure: INSERTION OF MESH;  Surgeon: Jovita Kussmaul, MD;  Location: [facility];  Service: General;  Laterality: N/A;  . LAPAROSCOPIC LYSIS OF ADHESIONS N/A 04/14/2013   Procedure: LAPAROSCOPIC LYSIS OF  ADHESIONS;  Surgeon: Adin Hector, MD;  Location: [facility];  Service: General;  Laterality: N/A;  . LAPAROTOMY N/A 07/30/2017   Procedure: EXPLORATORY LAPAROTOMY;  Surgeon: Jovita Kussmaul, MD;  Location: [facility];  Service: General;  Laterality: N/A;  . LYSIS OF ADHESION N/A 07/30/2017   Procedure: EXTENSIVE LYSIS OF ADHESION;  Surgeon: Jovita Kussmaul, MD;  Location: [facility];  Service: General;  Laterality: N/A;  . OMENTECTOMY N/A 07/30/2017   Procedure: PARTIAL OMENTECTOMY;  Surgeon: Jovita Kussmaul, MD;  Location: [facility];  Service: General;  Laterality: N/A;  . VENTRAL HERNIA REPAIR N/A 07/30/2017   Procedure: HERNIA REPAIR VENTRAL ADULT;  Surgeon: Jovita Kussmaul, MD;  Location: [facility];  Service: General;  Laterality: N/A;     OB History   No obstetric history on file.     Family History  Problem Relation Age of Onset  . Heart disease Mother   . Heart disease Father   . Heart disease Brother   . Cancer Brother   . Cancer Brother   . Hypertension Sister   . Heart disease Sister     Social History   Tobacco Use  . Smoking status: Never Smoker  . Smokeless tobacco: Never Used  Substance Use Topics  . Alcohol use: No  . Drug use: No    Home Medications Prior to Admission medications   Medication Sig Start Date End Date Taking? Authorizing Provider  atorvastatin (LIPITOR) 40 MG tablet TAKE 1 TABLET BY MOUTH EVERY DAY Patient taking differently: Take 40 mg by mouth at bedtime.  01/05/17   Lauree Chandler, NP  celecoxib (CELEBREX) 200 MG capsule Take 200 mg by mouth 2 (two) times daily. 04/20/17   [provider]  clindamycin (CLEOCIN) 150 MG capsule Take 3 capsules (450 mg total) by mouth 3 (three) times daily for 14 days. 04/22/19 05/06/19  Nuala Alpha A, PA-C  diphenhydrAMINE (BENADRYL) 25 MG tablet Take 25 mg by mouth at bedtime as needed for itching.    [provider]  doxycycline (VIBRAMYCIN) 100 MG capsule Take 1 capsule (100 mg total) by mouth 2 (two)  times daily. 04/14/19   Zigmund Gottron, NP  furosemide (LASIX) 20 MG tablet Take 20 mg by mouth every morning.    [provider]  glucose blood (ACCU-CHEK AVIVA PLUS) test strip Ell.65 check blood sugar twice daily as directed 01/24/16   Reed, Tiffany L, DO  insulin NPH Human (HUMULIN N,NOVOLIN N) 100 UNIT/ML injection Inject 76-98 Units into the skin See admin instructions. Injects 98 units before breakfast and 76 units before supper.    [provider]  Insulin Pen Needle (BD PEN NEEDLE NANO U/F) 32G X 4 MM MISC Use once daily with the administration of [place] DX E11.65 02/20/16   Reed, Tiffany L, DO  metFORMIN (GLUCOPHAGE) 500 MG tablet Take 1 tablet (500 mg total) by mouth daily with breakfast. Patient taking differently: Take 500 mg by mouth 2 (two) times daily with a meal.  07/26/17   Mikhail, Velta Addison, DO  metoprolol succinate (TOPROL-XL) 50 MG 24  hr tablet TAKE 1 TABLET BY MOUTH ONCE DAILY Patient taking differently: Take 50 mg by mouth daily.  06/08/16   Reed, Tiffany L, DO  omeprazole (PRILOSEC) 20 MG capsule TAKE 1 CAPSULE BY MOUTH DAILY Patient taking differently: Take 20 mg by mouth daily.  03/08/17   Reed, Tiffany L, DO  pioglitazone (ACTOS) 15 MG tablet Take 15 mg by mouth daily. 03/21/19   [provider]  potassium chloride SA (K-DUR,KLOR-CON) 20 MEQ tablet TAKE 1 TABLET(20 MEQ) BY MOUTH DAILY Patient taking differently: Take 20 mEq by mouth daily.  06/03/16   Reed, Tiffany L, DO  valsartan (DIOVAN) 320 MG tablet Take 320 mg by mouth daily. 03/21/19   [provider]    Allergies    Aspirin, Banana, Hydrocodone, Neomycin-polymyxin-gramicidin, Penicillins, Tramadol, Voltaren [diclofenac sodium], and Latex  Review of Systems   Review of Systems Ten systems are reviewed and are negative for acute change except as noted in the HPI  Physical Exam Updated Vital Signs BP (!) 132/55 (BP Location: Right Arm)   Pulse 77   Temp 98.2 F (36.8 C) (Oral)    Resp 16   LMP 07/02/1973   SpO2 100%   Physical Exam Constitutional:      General: She is not in acute distress.    Appearance: Normal appearance. She is well-developed. She is not ill-appearing or diaphoretic.  HENT:     Head: Normocephalic and atraumatic.     Right Ear: External ear normal.     Left Ear: External ear normal.     Nose: Nose normal.  Eyes:     General: Vision grossly intact. Gaze aligned appropriately.     Pupils: Pupils are equal, round, and reactive to light.  Neck:     Trachea: Trachea and phonation normal. No tracheal deviation.  Cardiovascular:     Pulses:          Dorsalis pedis pulses are 2+ on the left side.  Pulmonary:     Effort: Pulmonary effort is normal. No respiratory distress.  Abdominal:     General: There is no distension.     Palpations: Abdomen is soft.     Tenderness: There is no abdominal tenderness. There is no guarding or rebound.  Musculoskeletal:        General: Normal range of motion.     Cervical back: Normal range of motion.  Feet:     Left foot:     Protective Sensation: 5 sites tested. 5 sites sensed.     Skin integrity: Ulcer and erythema present.  Skin:    General: Skin is warm and dry.  Neurological:     Mental Status: She is alert.     GCS: GCS eye subscore is 4. GCS verbal subscore is 5. GCS motor subscore is 6.     Comments: Speech is clear and goal oriented, follows commands Major Cranial nerves without deficit, no facial droop Moves extremities without ataxia, coordination intact  Psychiatric:        Behavior: Behavior normal.         ED Results / Procedures / Treatments   Labs (all labs ordered are listed, but only abnormal results are displayed) Labs Reviewed  BASIC METABOLIC PANEL - Abnormal; Notable for the following components:      Result Value   Glucose, Bld 196 (*)    All other components within normal limits  CBC WITH DIFFERENTIAL/PLATELET    EKG None  Radiology DG Foot Complete  Left  Result Date: 04/22/2019 CLINICAL DATA:  Left heel wound 1-2 months. Patient on doxycycline without improvement. EXAM: LEFT FOOT - COMPLETE 3+ VIEW COMPARISON:  04/14/2019 FINDINGS: Moderate soft tissue swelling over the left foot and ankle with slight interval worsening. Degenerative changes throughout the left foot and ankle unchanged. Inferior calcaneal spur unchanged. Evidence of patient's superficial soft tissue ulceration over the plantar aspect of the heel unchanged. No evidence of deeper air within the subcutaneous tissues. No evidence of bone destruction to suggest underlying osteomyelitis. IMPRESSION: 1. Evidence of patient's small superficial soft tissue ulceration over the plantar aspect of the heel unchanged. No evidence of underlying osteomyelitis. 2. Diffuse soft tissue swelling over the left foot and ankle slightly worse. Degenerative changes throughout the left foot and ankle. Small inferior calcaneal spur. Electronically Signed   By: Marin Olp M.D.   On: 04/22/2019 09:03    Procedures Procedures (including critical care time)  Medications Ordered in ED Medications  clindamycin (CLEOCIN) IVPB 600 mg (0 mg Intravenous Stopped 04/22/19 1044)    ED Course  I have reviewed the triage vital signs and the nursing notes.  Pertinent labs & imaging results that were available during my care of the patient were reviewed by me and considered in my medical decision making (see chart for details).    MDM Rules/Calculators/A&P                     74 year old female with history of diabetes presents today for a wound of her left heel that has been ongoing for over 1 month.  She has been taking doxycycline and her symptoms have continued to worsen.  She has no history of fever, nausea vomiting or any additional concerns.  She appears to have begun developing a cellulitis of her left foot and ankle.  Concern at this time is for failed outpatient treatment and she will need admission and  IV antibiotics.  Will obtain basic blood work and an x-ray of her left foot to evaluate for osteomyelitis.  Patient is nontoxic-appearing, well-appearing and in no acute distress. Chart review of recent urgent care visit shows no osteomyelitis on x-ray from 04/14/2019. - DG Left Foot:  IMPRESSION:  1. Evidence of patient's small superficial soft tissue ulceration  over the plantar aspect of the heel unchanged. No evidence of  underlying osteomyelitis.    2. Diffuse soft tissue swelling over the left foot and ankle  slightly worse. Degenerative changes throughout the left foot and  ankle. Small inferior calcaneal spur.     Blood work today is reassuring, no leukocytosis, BMP shows elevated glucose otherwise within normal limits.  No evidence of sepsis. - On reassessment patient is ambulating around the room.  I had a long discussion with patient and advised her that we would like to admit her to the hospital today for continued IV antibiotics and specialist evaluation.  She adamantly refuses admission to the hospital.  I discussed risks of leaving [facility] with patient at length and she stated understanding.  She is fully alert and oriented and has the mental capacity to make her own medical decisions.  She was given IV clindamycin and will leave on p.o. clindamycin for 150 mg 3 times daily.  She states understanding that this treatment is not a replacement for admission to the hospital and that I strongly advised that she stay in the hospital today.  I discussed risks of leaving [facility] including but not limited to pain, worsening  of infection, disability and death and she stated understanding.  Patient is aware that she can return to the emergency department at any time for further evaluation and treatment.  Multiple opportunities were given to this patient however she refuses admission.  Patient was seen and evaluated by Dr. Jeanell Sparrow during this visit who also attempted to  convince patient to be admitted for treatment, agrees with outpatient therapy of clindamycin.  Patient was given paperwork and has left AGAINST MEDICAL ADVICE.  Note: Portions of this report may have been transcribed using voice recognition software. Every effort was made to ensure accuracy; however, inadvertent computerized transcription errors may still be present. Final Clinical Impression(s) / ED Diagnoses Final diagnoses:  Diabetic foot infection ([place])  Cellulitis of left lower extremity    Rx / DC Orders ED Discharge Orders         Ordered    clindamycin (CLEOCIN) 150 MG capsule  3 times daily     04/22/19 [address] 04/22/19 1132    Pattricia Boss, MD 04/27/19 [phone]

## 2019-04-22 NOTE — ED Triage Notes (Signed)
C/o wound to L heel x 1 1/2 months.  Seen at [facility] 1 week ago for same and taking Doxycycline.  No improvement with antibiotic.

## 2019-04-22 NOTE — Discharge Instructions (Addendum)
You are leaving [facility] at this time.  As we discussed I strongly recommend that you stay here at the hospital to be admitted for continued IV antibiotics and further evaluation of your foot.  I am concerned that your infection will continue to worsen if you leave today and could lead to worsening of your symptoms.  Leaving AGAINST MEDICAL ADVICE includes many risks including but not limited to worsening of infection, loss of your leg, disability, sepsis, death.  You are being discharged today with an antibiotic clindamycin that we hope will help with your symptoms however it is likely that your infection will continue to worsen even with this antibiotic.  Taking this antibiotic is not a replacement for hospital admission and IV antibiotics.  I again strongly recommend that you stay at the hospital today for further evaluation and treatment.  You may return to the emergency department at any time for further evaluation and treatment.  Please read the additional information packets attached to your discharge summary.  Do not take your medicine if  develop an itchy rash, swelling in your mouth or lips, or difficulty breathing; call 911 and seek immediate emergency medical attention if this occurs.  Note: Portions of this text may have been transcribed using voice recognition software. Every effort was made to ensure accuracy; however, inadvertent computerized transcription errors may still be present.

## 2019-05-31 ENCOUNTER — Other Ambulatory Visit: Payer: Self-pay

## 2019-05-31 ENCOUNTER — Other Ambulatory Visit
Admission: RE | Admit: 2019-05-31 | Discharge: 2019-05-31 | Disposition: A | Discharge status: Home / Self Care | Payer: Medicare Other | Source: Ambulatory Visit | Attending: Physician Assistant | Admitting: Physician Assistant

## 2019-05-31 ENCOUNTER — Encounter: Payer: Medicare Other | Attending: Internal Medicine | Admitting: Internal Medicine

## 2019-05-31 DIAGNOSIS — E11621 Type 2 diabetes mellitus with foot ulcer: Secondary | ICD-10-CM | POA: Insufficient documentation

## 2019-05-31 DIAGNOSIS — M868X7 Other osteomyelitis, ankle and foot: Secondary | ICD-10-CM | POA: Insufficient documentation

## 2019-05-31 DIAGNOSIS — M14672 Charcot's joint, left ankle and foot: Secondary | ICD-10-CM | POA: Insufficient documentation

## 2019-05-31 DIAGNOSIS — L97523 Non-pressure chronic ulcer of other part of left foot with necrosis of muscle: Secondary | ICD-10-CM | POA: Diagnosis present

## 2019-05-31 DIAGNOSIS — E1142 Type 2 diabetes mellitus with diabetic polyneuropathy: Secondary | ICD-10-CM | POA: Insufficient documentation

## 2019-05-31 DIAGNOSIS — I10 Essential (primary) hypertension: Secondary | ICD-10-CM | POA: Diagnosis not present

## 2019-05-31 DIAGNOSIS — L97522 Non-pressure chronic ulcer of other part of left foot with fat layer exposed: Secondary | ICD-10-CM | POA: Insufficient documentation

## 2019-06-01 ENCOUNTER — Other Ambulatory Visit (HOSPITAL_COMMUNITY): Payer: Self-pay | Admitting: Internal Medicine

## 2019-06-01 ENCOUNTER — Ambulatory Visit: Payer: Medicare Other | Admitting: Physician Assistant

## 2019-06-01 DIAGNOSIS — T148XXA Other injury of unspecified body region, initial encounter: Secondary | ICD-10-CM

## 2019-06-01 NOTE — Progress Notes (Signed)
Debra [name], Debra [name] ([id number]) Visit Report for 05/31/2019 Allergy List Details Patient Name: Debra [name], Debra [name]. Date of Service: 05/31/2019 2:15 PM Medical Record Number: [id number] Patient Account Number: [id number] Date of Birth/Sex: 1945/06/20 (74 y.o. F) Treating RN: Debra [name] Primary Care Debra [name] Debra [name] Other Clinician: Referring Debra [name] Treating Debra [name]/Extender: Debra [name] in Treatment: 0 Allergies Active Allergies penicillin latex aspirin banana hydrocodone Westcort (neomycin-polymx-HC) tramadol Voltaren Allergy Notes Electronic Signature(s) Signed: 06/01/2019 9:13:28 AM By: Debra [name] Entered By: Debra [name] on 05/31/2019 14:59:12 Debra [name] ([id number]) -------------------------------------------------------------------------------- Arrival Information Details Patient Name: Debra [name], Debra [name]. Date of Service: 05/31/2019 2:15 PM Medical Record Number: [id number] Patient Account Number: [id number] Date of Birth/Sex: 12-Aug-1945 (74 y.o. F) Treating RN: Debra [name] Primary Care Debra [name] Debra [name] Other Clinician: Referring Debra [name] Treating Debra [name]/Extender: Debra [name] in Treatment: 0 Visit Information Patient Arrived: Cane Arrival Time: 14:53 Accompanied By: self Transfer Assistance: None Patient Identification Verified: Yes Electronic Signature(s) Signed: 06/01/2019 9:13:28 AM By: Debra [name] Entered By: Debra [name] on 05/31/2019 14:54:31 [name], Debra [name] ([id number]) -------------------------------------------------------------------------------- Clinic Level of Care Assessment Details Patient Name: Debra [name], Debra [name]. Date of Service: 05/31/2019 2:15 PM Medical Record Number: [id number] Patient Account Number: [id number] Date of Birth/Sex: 07/25/1945 (74 y.o. F) Treating RN: Debra [name] Primary Care Debra [name] Debra [name] Other Clinician: Referring Debra [name] Treating Debra [name]/Extender: Debra [name] in Treatment: 0 Clinic Level of Care Assessment Items TOOL 1 Quantity Score '[]'  - Use when EandM and Procedure is performed on INITIAL visit 0 ASSESSMENTS - Nursing Assessment / Reassessment X - General Physical Exam (combine w/ comprehensive assessment (listed just below) when performed on new pt. 1 20 evals) X- 1 25 Comprehensive Assessment (HX, ROS, Risk Assessments, Wounds Hx, etc.) ASSESSMENTS - Wound and Skin Assessment / Reassessment '[]'  - Dermatologic / Skin Assessment (not related to wound area) 0 ASSESSMENTS - Ostomy and/or Continence Assessment and Care '[]'  - Incontinence Assessment and Management 0 '[]'  - 0 Ostomy Care Assessment and Management (repouching, etc.) PROCESS - Coordination of Care X - Simple Patient / Family Education for ongoing care 1 15 '[]'  - 0 Complex (extensive) Patient / Family Education for ongoing care '[]'  - 0 Staff obtains Programmer, systems, Records, Test Results / Process Orders '[]'  - 0 Staff telephones HHA, Nursing Homes / Clarify orders / etc '[]'  - 0 Routine Transfer to another Facility (non-emergent condition) '[]'  - 0 Routine Hospital Admission (non-emergent condition) X- 1 15 New Admissions / Biomedical engineer / Ordering NPWT, Apligraf, etc. '[]'  - 0 Emergency Hospital Admission (emergent condition) PROCESS - Special Needs '[]'  - Pediatric / Minor Patient Management 0 '[]'  - 0 Isolation Patient Management '[]'  - 0 Hearing / Language / Visual special needs '[]'  - 0 Assessment of Community assistance (transportation, D/C planning, etc.) '[]'  - 0 Additional assistance / Altered mentation '[]'  - 0 Support Surface(s) Assessment (bed, cushion, seat, etc.) INTERVENTIONS - Miscellaneous '[]'  - External ear exam 0 '[]'  - 0 Patient Transfer (multiple staff / Civil Service fast streamer / Similar devices) '[]'  - 0 Simple Staple / Suture removal (25 or less) '[]'  - 0 Complex Staple / Suture removal (26 or more) '[]'  - 0 Hypo/Hyperglycemic Management (do not check if billed  separately) [name], Debra [name]. ([id number]) X- 1 15 Ankle / Brachial Index (ABI) - do not check if billed separately Has the patient been seen at the hospital within the last three years: Yes Total Score: 90 Level Of Care: New/Established - Level 3 Electronic Signature(s) Signed: 05/31/2019 5:22:30 PM  By: Debra [name], BSN, RN, CWS, Kim RN, BSN Entered By: Debra [name], BSN, RN, CWS, Kim on 05/31/2019 15:46:10 Debra [name] ([id number]) -------------------------------------------------------------------------------- Encounter Discharge Information Details Patient Name: Debra [name], Debra [name]. Date of Service: 05/31/2019 2:15 PM Medical Record Number: [id number] Patient Account Number: [id number] Date of Birth/Sex: Dec 29, 1945 (74 y.o. F) Treating RN: Debra [name] Primary Care Debra [name] Debra [name] Other Clinician: Referring Shaquella Stamant: Treating Debra [name] Debra [name] in Treatment: 0 Encounter Discharge Information Items Post Procedure Vitals Discharge Condition: Stable Temperature (F): 98.7 Ambulatory Status: Cane Pulse (bpm): 80 Discharge Destination: Home Respiratory Rate (breaths/min): 16 Transportation: Private Auto Blood Pressure (mmHg): 180/78 Accompanied By: self Schedule Follow-up Appointment: Yes Clinical Summary of Care: Notes Patient to use her walker that she has at home. Electronic Signature(s) Signed: 05/31/2019 5:22:30 PM By: Debra [name], BSN, RN, CWS, Kim RN, BSN Entered By: Debra [name], BSN, RN, CWS, Kim on 05/31/2019 15:48:31 Debra [name] ([id number]) -------------------------------------------------------------------------------- Lower Extremity Assessment Details Patient Name: Debra [name], Debra [name]. Date of Service: 05/31/2019 2:15 PM Medical Record Number: [id number] Patient Account Number: [id number] Date of Birth/Sex: July 04, 1945 (74 y.o. F) Treating RN: Debra [name] Primary Care Nare Gaspari: Debra [name] Other Clinician: Referring Debra [name] Treating Debra [name]/Extender:  Debra [name] in Treatment: 0 Edema Assessment Assessed: [Left: No] [Right: No] Edema: [Left: Yes] [Right: No] Calf Left: Right: Point of Measurement: 34 cm From Medial Instep 47 cm cm Ankle Left: Right: Point of Measurement: 12 cm From Medial Instep 27 cm cm Vascular Assessment Pulses: Dorsalis Pedis Palpable: [Left:Yes] [Right:Yes] Doppler Audible: [Left:Yes] Posterior Tibial Palpable: [Left:Yes] Doppler Audible: [Left:Yes] Blood Pressure: Brachial: [Left:152] Dorsalis Pedis: 140 Ankle: Posterior Tibial: 170 Ankle Brachial Index: [Left:1.12] Electronic Signature(s) Signed: 06/01/2019 9:13:28 AM By: Debra [name] Entered By: Debra [name] on 05/31/2019 15:18:47 Bolender, Debra [name] ([id number]) -------------------------------------------------------------------------------- Multi Wound Chart Details Patient Name: Debra Devon [name]. Date of Service: 05/31/2019 2:15 PM Medical Record Number: [id number] Patient Account Number: [id number] Date of Birth/Sex: 1946/01/15 (74 y.o. F) Treating RN: Debra [name] Primary Care Krisa Blattner: Debra [name] Other Clinician: Referring Geneieve Duell: Treating Darsha Zumstein/Extender: Debra [name] in Treatment: 0 Vital Signs Height(in): 64 Pulse(bpm): 80 Weight(lbs): 230 Blood Pressure(mmHg): 180/78 Body Mass Index(BMI): 39 Temperature(F): 98.7 Respiratory Rate(breaths/min): 16 Photos: [N/A:N/A] Wound Location: Left, Plantar Foot Left, Plantar Toe Great N/A Wounding Event: Gradually Appeared Gradually Appeared N/A Primary Etiology: Diabetic Wound/Ulcer of the Lower Diabetic Wound/Ulcer of the Lower N/A Extremity Extremity Comorbid History: Cataracts, Hypertension, Type II Cataracts, Hypertension, Type II N/A Diabetes, Osteoarthritis Diabetes, Osteoarthritis Date Acquired: 05/09/2019 05/09/2019 N/A [name] of Treatment: 0 0 N/A Wound Status: Open Open N/A Measurements [name] x W x D (cm) 0.4x0.9x2.6 0.5x0.5x0.1 N/A Area (cm) : 0.283 0.196  N/A Volume (cm) : 0.735 0.02 N/A % Reduction in Area: 0.00% 0.00% N/A % Reduction in Volume: -30.10% 0.00% N/A Starting Position 1 (o'clock): 10 Ending Position 1 (o'clock): 12 Maximum Distance 1 (cm): 0.5 Undermining: Yes No N/A Classification: Grade 2 Unable to visualize wound bed N/A Exudate Amount: Medium Medium N/A Exudate Type: Serosanguineous Serosanguineous N/A Exudate Color: red, brown red, brown N/A Wound Margin: Flat and Intact N/A N/A Granulation Amount: Small (1-33%) None Present (0%) N/A Granulation Quality: Pink N/A N/A Necrotic Amount: Large (67-100%) Large (67-100%) N/A Necrotic Tissue: Eschar, Adherent Slough Eschar N/A Exposed Structures: Fat Layer (Subcutaneous Tissue) Fat Layer (Subcutaneous Tissue) N/A Exposed: Yes Exposed: Yes Fascia: No Fascia: No Tendon: No Tendon: No Muscle: No Muscle: No Joint: No Joint: No Bone: No Bone: No Epithelialization: N/A None N/A Debridement: Debridement -  Excisional N/A N/A Pre-procedure Verification/Time 15:30 N/A N/A Out Taken: SHENICA, HOLZHEIMER ([id number]) Pain Control: Lidocaine N/A N/A Tissue Debrided: Callus, Subcutaneous N/A N/A Level: Skin/Subcutaneous Tissue N/A N/A Debridement Area (sq cm): 1.2 N/A N/A Instrument: Curette N/A N/A Bleeding: Minimum N/A N/A Hemostasis Achieved: Pressure N/A N/A Debridement Treatment Procedure was tolerated well N/A N/A Response: Post Debridement Measurements 1.2x1x2.5 N/A N/A [name] x W x D (cm) Post Debridement Volume: (cm) 2.356 N/A N/A Procedures Performed: Debridement N/A N/A Treatment Notes Wound #1 (Left, Plantar Foot) 4. Dressing Applied: Other dressing (specify in notes) Notes scell, abd, conform, off-loading heel. Wound #2 (Left, Plantar Toe Great) 4. Dressing Applied: Other dressing (specify in notes) Notes scell, abd, conform, off-loading heel. Electronic Signature(s) Signed: 05/31/2019 5:25:14 PM By: Linton Ham MD Entered By: Linton Ham on  05/31/2019 16:32:26 Debra [name] ([id number]) -------------------------------------------------------------------------------- Warden Details Patient Name: Debra [name], HALFHILL. Date of Service: 05/31/2019 2:15 PM Medical Record Number: [id number] Patient Account Number: [id number] Date of Birth/Sex: 1945-09-08 (74 y.o. F) Treating RN: Debra [name] Primary Care Alieu Finnigan: Debra [name] Other Clinician: Referring Kimanh Templeman: Treating Kathern Lobosco/Extender: Debra [name] in Treatment: 0 Active Inactive Necrotic Tissue Nursing Diagnoses: Impaired tissue integrity related to necrotic/devitalized tissue Goals: Necrotic/devitalized tissue will be minimized in the wound bed Date Initiated: 05/31/2019 Target Resolution Date: 06/14/2019 Goal Status: Active Patient/caregiver will verbalize understanding of reason and process for debridement of necrotic tissue Date Initiated: 05/31/2019 Target Resolution Date: 06/14/2019 Goal Status: Active Interventions: Assess patient pain level pre-, during and post procedure and prior to discharge Treatment Activities: Apply topical anesthetic as ordered : 05/31/2019 Excisional debridement : 05/31/2019 Notes: Pain, Acute or Chronic Nursing Diagnoses: Pain, acute or chronic: actual or potential Goals: Patient/caregiver will verbalize comfort level met Date Initiated: 05/31/2019 Target Resolution Date: 06/21/2019 Goal Status: Active Interventions: Assess comfort goal upon admission Treatment Activities: Administer pain control measures as ordered : 05/31/2019 Notes: Peripheral Neuropathy Nursing Diagnoses: Knowledge deficit related to disease process and management of peripheral neurovascular dysfunction Goals: Patient/caregiver will verbalize understanding of disease process and disease management Date Initiated: 05/31/2019 Target Resolution Date: 06/14/2019 Goal Status: Active Debra [name], Debra [name]  ([id number]) Interventions: Assess signs and symptoms of neuropathy upon admission and as needed Notes: Wound/Skin Impairment Nursing Diagnoses: Impaired tissue integrity Goals: Patient/caregiver will verbalize understanding of skin care regimen Date Initiated: 05/31/2019 Target Resolution Date: 06/14/2019 Goal Status: Active Ulcer/skin breakdown will have a volume reduction of 30% by week 4 Date Initiated: 05/31/2019 Target Resolution Date: 07/01/2019 Goal Status: Active Interventions: Assess patient/caregiver ability to obtain necessary supplies Provide education on ulcer and skin care Notes: Electronic Signature(s) Signed: 05/31/2019 5:22:30 PM By: Debra [name], BSN, RN, CWS, Kim RN, BSN Entered By: Debra [name], BSN, RN, CWS, Kim on 05/31/2019 15:33:10 Debra [name] ([id number]) -------------------------------------------------------------------------------- Pain Assessment Details Patient Name: Debra [name], Debra [name]. Date of Service: 05/31/2019 2:15 PM Medical Record Number: [id number] Patient Account Number: [id number] Date of Birth/Sex: 1945-10-17 (74 y.o. F) Treating RN: Debra [name] Primary Care Arjen Deringer: Debra [name] Other Clinician: Referring Tedi Hughson: Treating Karesa Maultsby/Extender: Debra [name] in Treatment: 0 Active Problems Location of Pain Severity and Description of Pain Patient Has Paino Yes Site Locations Pain Location: Pain in Ulcers Rate the pain. Current Pain Level: 6 Pain Management and Medication Current Pain Management: Electronic Signature(s) Signed: 06/01/2019 9:13:28 AM By: Debra [name] Entered By: Debra [name] on 05/31/2019 14:54:48 Debra [name] ([id number]) -------------------------------------------------------------------------------- Patient/Caregiver Education Details Patient Name: Debra [name]. Date of Service: 05/31/2019 2:15 PM Medical  Record Number: [id number] Patient Account Number: [id number] Date of Birth/Gender: Nov 22, 1945 (74 y.o.  F) Treating RN: Debra [name] Primary Care Physician: Debra [name] Other Clinician: Referring Physician: Treating Physician/Extender: Debra [name] in Treatment: 0 Education Assessment Education Provided To: Patient Education Topics Provided Wound/Skin Impairment: Handouts: Caring for Your Ulcer Methods: Demonstration, Explain/Verbal Responses: State content correctly Electronic Signature(s) Signed: 05/31/2019 5:22:30 PM By: Debra [name], BSN, RN, CWS, Kim RN, BSN Entered By: Debra [name], BSN, RN, CWS, Kim on 05/31/2019 15:46:26 Debra [name] ([id number]) -------------------------------------------------------------------------------- Wound Assessment Details Patient Name: Debra [name], Debra [name]. Date of Service: 05/31/2019 2:15 PM Medical Record Number: [id number] Patient Account Number: [id number] Date of Birth/Sex: April 01, 1945 (74 y.o. F) Treating RN: Debra [name] Primary Care Ronisha Herringshaw: Debra [name] Other Clinician: Referring Deronda Christian: Treating Francessca Friis/Extender: Debra [name] in Treatment: 0 Wound Status Wound Number: 1 Primary Etiology: Diabetic Wound/Ulcer of the Lower Extremity Wound Location: Left, Plantar Foot Wound Status: Open Wounding Event: Gradually Appeared Comorbid Cataracts, Hypertension, Type II Diabetes, History: Osteoarthritis Date Acquired: 05/09/2019 [name] Of Treatment: 0 Clustered Wound: No Photos Wound Measurements Length: (cm) 0.4 % R Width: (cm) 0.9 % R Depth: (cm) 2.6 Tun Area: (cm) 0.283 Un Volume: (cm) 0.735 eduction in Area: 0% eduction in Volume: -30.1% neling: No dermining: Yes Starting Position (o'clock): 10 Ending Position (o'clock): 12 Maximum Distance: (cm) 0.5 Wound Description Classification: Grade 2 Fou Wound Margin: Flat and Intact Slo Exudate Amount: Medium Exudate Type: Serosanguineous Exudate Color: red, brown [name] Odor After Cleansing: No ugh/Fibrino Yes Wound Bed Granulation Amount: Small (1-33%) Exposed  Structure Granulation Quality: Pink Fascia Exposed: No Necrotic Amount: Large (67-100%) Fat Layer (Subcutaneous Tissue) Exposed: Yes Necrotic Quality: Eschar, Adherent Slough Tendon Exposed: No Muscle Exposed: No Joint Exposed: No Bone Exposed: No Treatment Notes Wound #1 (Left, Plantar Foot) 4. Dressing Applied: Other dressing (specify in notes) Kreitzer, Tippi [name]. ([id number]) Notes scell, abd, conform, off-loading heel. Electronic Signature(s) Signed: 05/31/2019 5:22:30 PM By: Debra [name], BSN, RN, CWS, Kim RN, BSN Entered By: Debra [name], BSN, RN, CWS, Kim on 05/31/2019 15:28:13 Debra [name] ([id number]) -------------------------------------------------------------------------------- Wound Assessment Details Patient Name: SHANEY, DECKMAN [name]. Date of Service: 05/31/2019 2:15 PM Medical Record Number: [id number] Patient Account Number: [id number] Date of Birth/Sex: [birth year] (74 y.o. F) Treating RN: Debra [name] Primary Care Antinette Keough: Debra [name] Other Clinician: Referring Yatziry Deakins: Treating Elvyn Krohn/Extender: Debra [name] in Treatment: 0 Wound Status Wound Number: 2 Primary Etiology: Diabetic Wound/Ulcer of the Lower Extremity Wound Location: Left, Plantar Toe Great Wound Status: Open Wounding Event: Gradually Appeared Comorbid Cataracts, Hypertension, Type II Diabetes, History: Osteoarthritis Date Acquired: 05/09/2019 [name] Of Treatment: 0 Clustered Wound: No Photos Wound Measurements Length: (cm) 0.5 % Width: (cm) 0.5 % Depth: (cm) 0.1 Ep Area: (cm) 0.196 T Volume: (cm) 0.02 U Reduction in Area: 0% Reduction in Volume: 0% ithelialization: None unneling: No ndermining: No Wound Description Classification: Unable to visualize wound bed Fo Exudate Amount: Medium Sl Exudate Type: Serosanguineous Exudate Color: red, brown ul Odor After Cleansing: No ough/Fibrino Yes Wound Bed Granulation Amount: None Present (0%) Exposed Structure Necrotic Amount: Large  (67-100%) Fascia Exposed: No Necrotic Quality: Eschar Fat Layer (Subcutaneous Tissue) Exposed: Yes Tendon Exposed: No Muscle Exposed: No Joint Exposed: No Bone Exposed: No Treatment Notes Wound #2 (Left, Plantar Toe Great) 4. Dressing Applied: Other dressing (specify in notes) Notes scell, abd, conform, off-loading heel. Electronic Signature(s) KYERA, FELAN ([id number]) Signed: 05/31/2019 5:22:30 PM By: Debra [name], BSN, RN, CWS, Kim RN, BSN Entered By: Debra [name], BSN, RN, CWS, Kim  on 05/31/2019 15:28:18 NORAH, DEVIN ([id number]) -------------------------------------------------------------------------------- Vitals Details Patient Name: DANETTA, PROM. Date of Service: 05/31/2019 2:15 PM Medical Record Number: [id number] Patient Account Number: [id number] Date of Birth/Sex: 1945/05/28 (74 y.o. F) Treating RN: Debra [name] Primary Care Aum Caggiano: Debra [name] Other Clinician: Referring Ena Demary: Treating Caleb Decock/Extender: Debra [name] in Treatment: 0 Vital Signs Time Taken: 14:55 Temperature (F): 98.7 Height (in): 64 Pulse (bpm): 80 Source: Stated Respiratory Rate (breaths/min): 16 Weight (lbs): 230 Blood Pressure (mmHg): 180/78 Source: Stated Reference Range: 80 - 120 mg / dl Body Mass Index (BMI): 39.5 Electronic Signature(s) Signed: 06/01/2019 9:13:28 AM By: Debra [name] Entered By: Debra [name] on 05/31/2019 14:56:16

## 2019-06-01 NOTE — Progress Notes (Signed)
Debra, [name] ([id number]) Visit Report for 05/31/2019 Chief Complaint Document Details Patient Name: Debra [name], Debra [name]. Date of Service: 05/31/2019 2:15 PM Medical Record Number: [id number] Patient Account Number: [id number] Date of Birth/Sex: 12-16-1945 (74 y.o. F) Treating RN: Cornell Barman Primary Care Provider: Vicenta Aly Other Clinician: Referring Provider: Treating Provider/Extender: Tito Dine in Treatment: 0 Information Obtained from: Patient Chief Complaint 05/31/2019; patient is here for review of a deep probing wound on the plantar heel Electronic Signature(s) Signed: 05/31/2019 5:25:14 PM By: Linton Ham MD Entered By: Linton Ham on 05/31/2019 16:33:57 Been, Estanislado Spire ([id number]) -------------------------------------------------------------------------------- Debridement Details Patient Name: Debra, [name] Date of Service: 05/31/2019 2:15 PM Medical Record Number: [id number] Patient Account Number: [id number] Date of Birth/Sex: March 06, 1946 (74 y.o. F) Treating RN: Cornell Barman Primary Care Provider: Vicenta Aly Other Clinician: Referring Provider: Treating Provider/Extender: Tito Dine in Treatment: 0 Debridement Performed for Wound #1 Left,Plantar Foot Assessment: Performed By: Physician Ricard Dillon, MD Debridement Type: Debridement Severity of Tissue Pre Debridement: Muscle involvement without necrosis Level of Consciousness (Pre- Awake and Alert procedure): Pre-procedure Verification/Time Out Yes - 15:30 Taken: Start Time: 15:30 Pain Control: Lidocaine Total Area Debrided (L x W): 1.2 (cm) x 1 (cm) = 1.2 (cm) Tissue and other material debrided: Viable, Non-Viable, Callus, Subcutaneous Level: Skin/Subcutaneous Tissue Debridement Description: Excisional Instrument: Curette Bleeding: Minimum Hemostasis Achieved: Pressure End Time: 15:36 Response to Treatment: Procedure was tolerated well Level of Consciousness  (Post- Awake and Alert procedure): Post Debridement Measurements of Total Wound Length: (cm) 1.2 Width: (cm) 1 Depth: (cm) 2.5 Volume: (cm) 2.356 Character of Wound/Ulcer Post Debridement: Stable Severity of Tissue Post Debridement: Fat layer exposed Post Procedure Diagnosis Same as Pre-procedure Electronic Signature(s) Signed: 05/31/2019 5:22:30 PM By: Gretta Cool, BSN, RN, CWS, Kim RN, BSN Signed: 05/31/2019 5:25:14 PM By: Linton Ham MD Entered By: Linton Ham on 05/31/2019 16:32:57 Schlusser, Estanislado Spire ([id number]) -------------------------------------------------------------------------------- HPI Details Patient Name: Debra, [name] Date of Service: 05/31/2019 2:15 PM Medical Record Number: [id number] Patient Account Number: [id number] Date of Birth/Sex: [birth year] (74 y.o. 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 She tells me she is had a wound on the plantar aspect of her left great toe since August. There was swelling and redness and she was diagnosed with cellulitis. For a period of time she was on 2 different antibiotics 1 for a UTI and 1 for the toe and things actually got better. As far as I'm aware she has not had cultures or x-rays of the area. Although she states she has arterial disease the ABI in the right was 1.03 in our clinic on the left 1.08. I'm not certain how she is dressing this currently although she is certainly not offloading. She also has more recently developed a weeping wound on the left anterior leg and 2 small areas on the right anterior leg. She probably has chronic venous insufficiency with inflammation bilaterally. I suspect she also has development of a Charcot foot on the  left 01/25/15; she still has a denuded blister on the right leg there is no open area on the left leg. On her left great toe plantar aspect the wound required a surgical debridement to remove skin nonviable subcutaneous tissue post debridement this actually cleans up quite nicely there  is no overt infection here no evidence of soft tissue infection or ischemia 02/01/15; the patient arrives with poorly controlled edema bilateral legs. She has a open wound on the right leg. She has not been wearing the healing sagittals and therefore the left plantar toe wound is not better with surrounding callus Readmission: 09/07/18 on evaluation today patient actually presents for initial inspection our clinic concerning issues that she's been having with her left great toe along with the bilateral lower extremities. The lower Trinity ulcers appear to be a combination of Venus and diabetic and the great toe ulcer is a diabetic neuropathy type ulcer. Unfortunately she's had these for quite some time in regard to the toe and the right lower extremity left lower Trinity ulcers she tells me have appeared in the past week. There's no sign of infection at any site which is good news. In regard to the toe ulcer I see no evidence of bone exposure which is good news. There was some question as to whether or not bone was exposed when she saw her primary care provider. The patient does have a history as well of hypertension. 09/14/18 on evaluation today patient's wound to lower extremity actually appear to be doing excellent. In fact the biggest thing I see is that the alginate kind of got stuck to the wound bed's which is the main issue here. Fortunately there's no signs of active infection which is good news. No fevers, chills, nausea, or vomiting noted at this time. 09/21/18 on evaluation today patient actually appears to be doing excellent in regard to her bilateral lower extremities both are healed. In regard to her left  great toe ulcer this is not completely healed at this point in fact it really does show some signs of callous buildup as well as Slough in the service wound although it's minimal. No fevers, chills, nausea, or vomiting noted at this time. 09/28/18 on evaluation today patient actually appears to be doing okay in regard to her toe ulcer. Unfortunately she does have some issues with the wound. Somewhat dry but it sounds like she is not been putting the collagen on the wound bed. She has been just utilizing a Dry gauze dressing. She states that she "couldn't find the collagen". Nonetheless I think this is why the wound bed appears to be so dry today. Obviously we do not want it to wet macerated but we also do not want it to dry. 10/12/18 on evaluation today patient appears to be doing about the same in regard to her toe ulcer. She's been tolerating the dressing changes without complication. With that being said she did not want to feed more in the past few days she tells me and it does show in the evidence of her lower extremity swelling at this point. Fortunately there does not appear to be any signs of active infection at this time. With that being said she's been tolerating the dressing changes without complication. No fevers, chills, nausea, or vomiting noted at this time. The7/29/2020 on evaluation today patient actually appears to be doing quite well with regard to her toe ulcer. This is showing signs of improvement and in fact wound is measuring about 60% the size it was last evaluation. This is excellent news. Overall I am very pleased with how she seems to be progressing she does tell me she is taking more breaks and trying to stay off of it as much as possible. 10/26/2018 on evaluation today patient actually appears to be  doing quite well with regard to her wounds on the wound on the left great toe. She has been tolerating the dressing changes without complication. Fortunately there is no signs of  active infection at this time. Overall very pleased with the progress that is been made up to this point. With that being said she seems to be stalled out at this time and I think we may need to try something a little different in order to get this to completely heal. 11/02/2018 upon evaluation today patient actually appears to be doing well with regard to her toe ulcer from the standpoint of the overall appearance. With that being said she is having some issues currently with the left lower extremity where she has a small blister that opened in the past 24 hours that does need to be addressed today as well. Fortunately there is no sign of active infection at this time which is good news. 11/09/2018 on evaluation today patient actually appears to be doing a little better in regard to her toe ulcer. She has been tolerating the dressing changes without complication. Fortunately there is no signs of active infection at this time. No fevers, chills, nausea, vomiting, or diarrhea. Overall I am actually rather pleased with how things seem to be progressing at this time. 11/16/2018 on evaluation today patient's toe ulcer appears to be doing quite well which is good news. Fortunately there is no signs of active infection Stankowski, Theresa L. ([id number]) which is also good news. No fevers, chills, nausea, vomiting, or diarrhea. As of this point we still have not heard anything back about the Dermagraft and whether or not this will be approved. 11/23/18 upon evaluation today patient actually appears to be doing quite well with regard to her toe ulcer all things considered. We still not gotten approval from Dermagraft as of yet. Obviously the sooner we can get this to better in my pinion nonetheless there's gonna be some need for sharp debridement today based on what I'm seeing. 11/30/2018 on evaluation today patient actually appears to be doing about the same with regard to her toe ulcer. Fortunately there is no severe  evidence of callus buildup which is good news. With that being said I definitely think that she will do well with the Dermagraft which fortunately we have gotten approved at this time. There is no signs of infection so I think this is absolutely appropriate for [place] to attempt at this point. 12/07/2018 on evaluation today patient appears to be doing much better with regard to her toe ulcer at this time. Fortunately there is no signs of active infection at this point. She has been tolerating the dressing changes without complication. No fevers, chills, nausea, vomiting, or diarrhea. I do believe the Dermagraft has been beneficial for her her wound seems to be measuring much smaller today compared to last week's evaluation this is excellent. Overall she continues to make good progress. 12/14/2018 on evaluation today patient appears to be doing well with regard to her toe ulcer. This is showing signs of improvement which is good news. Overall I am very pleased With how things appear. There is no signs of active infection at this time. I do feel like the wound is appearing much more healthy based on what I see today. Today is Dermagraft application #3. ADMISSION 05/31/2019 The patient's previous records from our office in [place] are included above. The patient is here for a deep probing wound on the plantar left heel. She is a type  II diabetic with peripheral neuropathy and a Charcot foot at that time. It would appear that this started sometime in December although the details are vague. She was in urgent care on 04/14/2019 with what was felt to be a wound infection in the left foot there was noted to be drainage and odor. She was put on doxycycline and x-ray at that time showed no osteomyelitis. She was back in the ER on 04/22/2019 with increasing swelling and redness on the plantar foot. There is a picture in epic to reflect this really looked quite impressive. She adamantly refused admission. She  was given clindamycin again another x-ray was negative but noted soft tissue swelling. The patient was largely in [place] from June to September with a wound on the left plantar toe. Dermagraft was tried currently she was discharged on healed when the patient's insurance stopped paying for her to come there. She dropped out of the clinic at her own insistence. The patient is not on any current antibiotics. I am not clear how she is dressing this. The area on the left great toe has eschared over Past medical history; type 2 diabetes with neuropathy, hypertension, chronic left toe ulcer, history of small bowel obstruction secondary to hernia status post ventral hernia repair ABIs in our clinic were 1.12 on the left. The patient had formal arterial studies in October 2016 at that point her ABI was 1.2 TBI at 1.02 on the right triphasic waveforms. On the left ABI at 1.24 TBI was not done probably because of bandaging triphasic waveforms Electronic Signature(s) Signed: 05/31/2019 5:25:14 PM By: Linton Ham MD Entered By: Linton Ham on 05/31/2019 16:40:33 [name] ([id number]) -------------------------------------------------------------------------------- Physical Exam Details Patient Name: ZYNAE, HEREK L. Date of Service: 05/31/2019 2:15 PM Medical Record Number: [id number] Patient Account Number: [id number] Date of Birth/Sex: December 31, 1945 (74 y.o. F) Treating RN: Cornell Barman Primary Care Provider: Vicenta Aly Other Clinician: Referring Provider: Treating Provider/Extender: Tito Dine in Treatment: 0 Constitutional Patient is hypertensive.. Pulse regular and within target range for patient.Marland Kitchen Respirations regular, non-labored and within target range.. Temperature is normal and within the target range for the patient.Marland Kitchen appears in no distress. Respiratory Respiratory effort is easy and symmetric bilaterally. Rate is normal at rest and on room  air.. Cardiovascular Pedal pulses palpable and strong bilaterally.. Edema present in both extremities. Nonpitting. I suspect she has lymphedema and chronic venous insufficiency as well.. Musculoskeletal Pes planus deformity of the left foot this looks like a Charcot deformity.. Neurological Reduced sensation on the left plantar foot. Psychiatric No evidence of depression, anxiety, or agitation. Calm, cooperative, and communicative. Appropriate interactions and affect.. Notes Wound exam; small but deep tunneling wound. Most of the overhanging skin and subcutaneous tissue I removed with a #5 curette. I could not convince myself that this goes to bone but at 2.7 cm of probing depth I suspect we are very close. There is also undermining especially to Ward's the medial part of the foot also superiorly. There was no gross purulence but a culture was done. oThe plantar toe wound on the left that was so problematic for most of the summer into early September in our sister clinic is eschared over this is a small area. I will remove this next time to check on the healing status underneath Electronic Signature(s) Signed: 05/31/2019 5:25:14 PM By: Linton Ham MD Entered By: Linton Ham on 05/31/2019 16:46:56 [name] ([id number]) -------------------------------------------------------------------------------- Physician Orders Details Patient Name: VERNESSA, GAILES L. Date of Service:  05/31/2019 2:15 PM Medical Record Number: [id number] Patient Account Number: [id number] Date of Birth/Sex: [birth year] (73 y.o. F) Treating RN: Cornell Barman Primary Care Provider: Vicenta Aly Other Clinician: Referring Provider: Treating Provider/Extender: Tito Dine in Treatment: 0 Verbal / Phone Orders: No Diagnosis Coding Wound Cleansing Wound #1 Left,Plantar Foot o May shower with protection. - Do not get dressing wet. o No tub bath. Anesthetic (add to Medication List) Wound #1  Left,Plantar Foot o Topical Lidocaine 4% cream applied to wound bed prior to debridement (In Clinic Only). Primary Wound Dressing Wound #1 Left,Plantar Foot o Silver Alginate Wound #2 Left,Plantar Toe Great o Other: - Coverlet Secondary Dressing Wound #1 Left,Plantar Foot o ABD and Kerlix/Conform Dressing Change Frequency Wound #1 Left,Plantar Foot o Change Dressing Monday, Wednesday, Friday Follow-up Appointments Wound #1 Left,Plantar Foot o Return Appointment in 1 week. Wound #2 Left,Plantar Toe Great o Return Appointment in 1 week. Off-Loading Wound #1 Left,Plantar Foot o Heel suspension boot Additional Orders / Instructions Wound #1 Left,Plantar Foot o Increase protein intake. Medications-please add to medication list. Wound #1 Left,Plantar Foot o P.O. Antibiotics Wound #2 Left,Plantar Toe Great o P.O. Antibiotics Laboratory o Bacteria identified in Wound by Culture (MICRO) - Left plantar heel oooo LOINC Code: [id number] ZAIRA, LIGHT ([id number]) oooo Convenience Name: Wound culture routine Radiology o MRI, lower extremity without contrast - Left plantar heel Electronic Signature(s) Signed: 05/31/2019 5:22:30 PM By: Gretta Cool, BSN, RN, CWS, Kim RN, BSN Signed: 05/31/2019 5:25:14 PM By: Linton Ham MD Entered By: Gretta Cool, BSN, RN, CWS, Kim on 05/31/2019 15:45:26 [name] ([id number]) -------------------------------------------------------------------------------- Problem List Details Patient Name: DAIZAH, PERISHO L. Date of Service: 05/31/2019 2:15 PM Medical Record Number: [id number] Patient Account Number: [id number] Date of Birth/Sex: 1946-03-17 (74 y.o. F) Treating RN: Cornell Barman Primary Care Provider: Vicenta Aly Other Clinician: Referring Provider: Treating Provider/Extender: Tito Dine in Treatment: 0 Active Problems ICD-10 Evaluated Encounter Code Description Active Date Today Diagnosis E11.621 Type 2  diabetes mellitus with foot ulcer 05/31/2019 No Yes L97.523 Non-pressure chronic ulcer of other part of left foot with necrosis of muscle 05/31/2019 No Yes E11.42 Type 2 diabetes mellitus with diabetic polyneuropathy 05/31/2019 No Yes M14.672 Charcot's joint, left ankle and foot 05/31/2019 No Yes Inactive Problems Resolved Problems Electronic Signature(s) Signed: 05/31/2019 5:25:14 PM By: Linton Ham MD Entered By: Linton Ham on 05/31/2019 15:48:27 Mefferd, Estanislado Spire ([id number]) -------------------------------------------------------------------------------- Progress Note Details Patient Name: [name] Date of Service: 05/31/2019 2:15 PM Medical Record Number: [id number] Patient Account Number: [id number] Date of Birth/Sex: 04/10/1945 (74 y.o. 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 She tells me she is had a wound on the plantar aspect of her left great toe since August. There was swelling and redness and she was diagnosed with cellulitis. For a period of time she was on 2 different antibiotics 1 for a UTI and 1 for the toe and things actually got better. As far as I'm aware she has not had cultures or x-rays of the area. Although she states she has arterial disease the ABI in the right was 1.03 in our clinic on the left 1.08. I'm not certain  how she is dressing this currently although she is certainly not offloading. She also has more recently developed a weeping wound on the left anterior leg and 2 small areas on the right anterior leg. She probably has chronic venous  insufficiency with inflammation bilaterally. I suspect she also has development of a Charcot foot on the left 01/25/15; she still has a denuded blister on the right leg there is no open area on the left leg. On her left great toe plantar aspect the wound required a surgical debridement to remove skin nonviable subcutaneous tissue post debridement this actually cleans up quite nicely there is no overt infection here no evidence of soft tissue infection or ischemia 02/01/15; the patient arrives with poorly controlled edema bilateral legs. She has a open wound on the right leg. She has not been wearing the healing sagittals and therefore the left plantar toe wound is not better with surrounding callus Readmission: 09/07/18 on evaluation today patient actually presents for initial inspection our clinic concerning issues that she's been having with her left great toe along with the bilateral lower extremities. The lower Trinity ulcers appear to be a combination of Venus and diabetic and the great toe ulcer is a diabetic neuropathy type ulcer. Unfortunately she's had these for quite some time in regard to the toe and the right lower extremity left lower Trinity ulcers she tells me have appeared in the past week. There's no sign of infection at any site which is good news. In regard to the toe ulcer I see no evidence of bone exposure which is good news. There was some question as to whether or not bone was exposed when she saw her primary care provider. The patient does have a history as well of hypertension. 09/14/18 on evaluation today patient's wound to lower extremity actually appear to be doing excellent. In fact the biggest thing I see is that the alginate kind of got stuck to the wound bed's which is the main issue here. Fortunately there's no signs of active infection which is good news. No fevers, chills, nausea, or vomiting noted at this time. 09/21/18 on evaluation today patient actually appears to  be doing excellent in regard to her bilateral lower extremities both are healed. In regard to her left great toe ulcer this is not completely healed at this point in fact it really does show some signs of callous buildup as well as Slough in the service wound although it's minimal. No fevers, chills, nausea, or vomiting noted at this time. 09/28/18 on evaluation today patient actually appears to be doing okay in regard to her toe ulcer. Unfortunately she does have some issues with the wound. Somewhat dry but it sounds like she is not been putting the collagen on the wound bed. She has been just utilizing a Dry gauze dressing. She states that she "couldn't find the collagen". Nonetheless I think this is why the wound bed appears to be so dry today. Obviously we do not want it to wet macerated but we also do not want it to dry. 10/12/18 on evaluation today patient appears to be doing about the same in regard to her toe ulcer. She's been tolerating the dressing changes without complication. With that being said she did not want to feed more in the past few days she tells me and it does show in the evidence of her lower extremity swelling at this point. Fortunately there does not appear to be any signs of active infection at this  time. With that being said she's been tolerating the dressing changes without complication. No fevers, chills, nausea, or vomiting noted at this time. The7/29/2020 on evaluation today patient actually appears to be doing quite well with regard to her toe ulcer. This is showing signs of improvement and in fact wound is measuring about 60% the size it was last evaluation. This is excellent news. Overall I am very pleased with how she seems to be progressing she does tell me she is taking more breaks and trying to stay off of it as much as possible. 10/26/2018 on evaluation today patient actually appears to be doing quite well with regard to her wounds on the wound on the left great toe.  She has been tolerating the dressing changes without complication. Fortunately there is no signs of active infection at this time. Overall very pleased with the progress that is been made up to this point. With that being said she seems to be stalled out at this time and I think we may need to try something a little different in order to get this to completely heal. 11/02/2018 upon evaluation today patient actually appears to be doing well with regard to her toe ulcer from the standpoint of the overall appearance. With that being said she is having some issues currently with the left lower extremity where she has a small blister that opened in the past 24 hours that does need to be addressed today as well. Fortunately there is no sign of active infection at this time which is good news. QUORRA, HERRY ([id number]) 11/09/2018 on evaluation today patient actually appears to be doing a little better in regard to her toe ulcer. She has been tolerating the dressing changes without complication. Fortunately there is no signs of active infection at this time. No fevers, chills, nausea, vomiting, or diarrhea. Overall I am actually rather pleased with how things seem to be progressing at this time. 11/16/2018 on evaluation today patient's toe ulcer appears to be doing quite well which is good news. Fortunately there is no signs of active infection which is also good news. No fevers, chills, nausea, vomiting, or diarrhea. As of this point we still have not heard anything back about the Dermagraft and whether or not this will be approved. 11/23/18 upon evaluation today patient actually appears to be doing quite well with regard to her toe ulcer all things considered. We still not gotten approval from Dermagraft as of yet. Obviously the sooner we can get this to better in my pinion nonetheless there's gonna be some need for sharp debridement today based on what I'm seeing. 11/30/2018 on evaluation today patient  actually appears to be doing about the same with regard to her toe ulcer. Fortunately there is no severe evidence of callus buildup which is good news. With that being said I definitely think that she will do well with the Dermagraft which fortunately we have gotten approved at this time. There is no signs of infection so I think this is absolutely appropriate for [place] to attempt at this point. 12/07/2018 on evaluation today patient appears to be doing much better with regard to her toe ulcer at this time. Fortunately there is no signs of active infection at this point. She has been tolerating the dressing changes without complication. No fevers, chills, nausea, vomiting, or diarrhea. I do believe the Dermagraft has been beneficial for her her wound seems to be measuring much smaller today compared to last week's evaluation this is excellent.  Overall she continues to make good progress. 12/14/2018 on evaluation today patient appears to be doing well with regard to her toe ulcer. This is showing signs of improvement which is good news. Overall I am very pleased With how things appear. There is no signs of active infection at this time. I do feel like the wound is appearing much more healthy based on what I see today. Today is Dermagraft application #3. ADMISSION 05/31/2019 The patient's previous records from our office in [place] are included above. The patient is here for a deep probing wound on the plantar left heel. 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 It would appear that this started sometime in December although the details are vague. She was in urgent care on 04/14/2019 with what was felt to be a wound infection in the left foot there was noted to be drainage and odor. She was put on doxycycline and x-ray at that time showed no osteomyelitis. She was back in the ER on 04/22/2019 with increasing swelling and redness on the plantar foot. There is a  picture in epic to reflect this really looked quite impressive. She adamantly refused admission. She was given clindamycin again another x-ray was negative but noted soft tissue swelling. The patient was largely in [place] from June to September with a wound on the left plantar toe. Dermagraft was tried currently she was discharged on healed when the patient's insurance stopped paying for her to come there. She dropped out of the clinic at her own insistence. The patient is not on any current antibiotics. I am not clear how she is dressing this. The area on the left great toe has eschared over Past medical history; type 2 diabetes with neuropathy, hypertension, chronic left toe ulcer, history of small bowel obstruction secondary to hernia status post ventral hernia repair ABIs in our clinic were 1.12 on the left. The patient had formal arterial studies in October 2016 at that point her ABI was 1.2 TBI at 1.02 on the right triphasic waveforms. On the left ABI at 1.24 TBI was not done probably because of bandaging triphasic waveforms Patient History Information obtained from Patient. Allergies penicillin, latex, aspirin, banana, hydrocodone, Westcort (neomycin-polymx-HC), tramadol, Voltaren Family History Cancer - Siblings, Diabetes - Siblings, Heart Disease - Father,Mother, Hypertension - Father,Mother, No family history of Hereditary Spherocytosis, Kidney Disease, Lung Disease, Seizures, Stroke, Thyroid Problems, Tuberculosis. Social History Never smoker, Marital Status - Widowed, Alcohol Use - Never, Drug Use - No History, Caffeine Use - Never. Medical History Eyes Patient has history of Cataracts - removed Denies history of Glaucoma, Optic Neuritis Ear/Nose/Mouth/Throat Denies history of Chronic sinus problems/congestion, Middle ear problems Hematologic/Lymphatic Denies history of Anemia, Hemophilia, Human Immunodeficiency Virus, Lymphedema, Sickle Cell Disease Respiratory Denies  history of Aspiration, Asthma, Chronic Obstructive Pulmonary Disease (COPD), Pneumothorax, Sleep Apnea, Tuberculosis Cardiovascular CAMEO, LEVINGS ([id number]) Patient has history of Hypertension Denies history of Angina, Arrhythmia, Congestive Heart Failure, Coronary Artery Disease, Deep Vein Thrombosis, Hypotension, Myocardial Infarction, Peripheral Arterial Disease, Peripheral Venous Disease, Phlebitis, Vasculitis Gastrointestinal Denies history of Cirrhosis , Colitis, Crohn s, Hepatitis A, Hepatitis B, Hepatitis C Endocrine Patient has history of Type II Diabetes Genitourinary Denies history of End Stage Renal Disease Immunological Denies history of Lupus Erythematosus, Raynaud s, Scleroderma Integumentary (Skin) Denies history of History of Burn, History of pressure wounds Musculoskeletal Patient has history of Osteoarthritis Denies history of Gout, Rheumatoid Arthritis, Osteomyelitis Neurologic Denies history of Dementia, Neuropathy,  Quadriplegia, Paraplegia, Seizure Disorder Oncologic Denies history of Received Chemotherapy, Received Radiation Psychiatric Denies history of Anorexia/bulimia, Confinement Anxiety Patient is treated with Insulin, Oral Agents. Review of Systems (ROS) Constitutional Symptoms (General Health) Denies complaints or symptoms of Fatigue, Fever, Chills, Marked Weight Change. Eyes Denies complaints or symptoms of Dry Eyes, Vision Changes, Glasses / Contacts. Ear/Nose/Mouth/Throat Denies complaints or symptoms of Difficult clearing ears, Sinusitis. Hematologic/Lymphatic Denies complaints or symptoms of Bleeding / Clotting Disorders, Human Immunodeficiency Virus. Respiratory Denies complaints or symptoms of Chronic or frequent coughs, Shortness of Breath. Cardiovascular Denies complaints or symptoms of Chest pain, LE edema. Gastrointestinal Denies complaints or symptoms of Frequent diarrhea, Nausea, Vomiting. Endocrine Denies complaints or symptoms  of Hepatitis, Thyroid disease, Polydypsia (Excessive Thirst). Genitourinary Denies complaints or symptoms of Kidney failure/ Dialysis, Incontinence/dribbling. Immunological Denies complaints or symptoms of Hives, Itching. Integumentary (Skin) Complains or has symptoms of Wounds. Denies complaints or symptoms of Bleeding or bruising tendency, Breakdown, Swelling. Musculoskeletal Denies complaints or symptoms of Muscle Pain, Muscle Weakness. Neurologic Denies complaints or symptoms of Numbness/parasthesias, Focal/Weakness. Psychiatric Denies complaints or symptoms of Anxiety, Claustrophobia. Objective Constitutional Patient is hypertensive.. Pulse regular and within target range for patient.Marland Kitchen Respirations regular, non-labored and within target range.. Temperature is normal and within the target range for the patient.Marland Kitchen appears in no distress. Vitals Time Taken: 2:55 PM, Height: 64 in, Source: Stated, Weight: 230 lbs, Source: Stated, BMI: 39.5, Temperature: 98.7 F, Pulse: 80 bpm, Respiratory Rate: 16 breaths/min, Blood Pressure: 180/78 mmHg. MARAGRET, BOHLKEN ([id number]) Respiratory Respiratory effort is easy and symmetric bilaterally. Rate is normal at rest and on room air.. Cardiovascular Pedal pulses palpable and strong bilaterally.. Edema present in both extremities. Nonpitting. I suspect she has lymphedema and chronic venous insufficiency as well.. Musculoskeletal Pes planus deformity of the left foot this looks like a Charcot deformity.. Neurological Reduced sensation on the left plantar foot. Psychiatric No evidence of depression, anxiety, or agitation. Calm, cooperative, and communicative. Appropriate interactions and affect.. General Notes: Wound exam; small but deep tunneling wound. Most of the overhanging skin and subcutaneous tissue I removed with a #5 curette. I could not convince myself that this goes to bone but at 2.7 cm of probing depth I suspect we are very close.  There is also undermining especially to Ward's the medial part of the foot also superiorly. There was no gross purulence but a culture was done. The plantar toe wound on the left that was so problematic for most of the summer into early September in our sister clinic is eschared over this is a small area. I will remove this next time to check on the healing status underneath Integumentary (Hair, Skin) Wound #1 status is Open. Original cause of wound was Gradually Appeared. The wound is located on the [place]. The wound measures 0.4cm length x 0.9cm width x 2.6cm depth; 0.283cm^2 area and 0.735cm^3 volume. There is Fat Layer (Subcutaneous Tissue) Exposed exposed. There is no tunneling noted, however, there is undermining starting at 10:00 and ending at 12:00 with a maximum distance of 0.5cm. There is a medium amount of serosanguineous drainage noted. The wound margin is flat and intact. There is small (1-33%) pink granulation within the wound bed. There is a large (67-100%) amount of necrotic tissue within the wound bed including Eschar and Adherent Slough. Wound #2 status is Open. Original cause of wound was Gradually Appeared. The wound is located on the [employer]. The wound measures 0.5cm length x 0.5cm width x 0.1cm depth;  0.196cm^2 area and 0.02cm^3 volume. There is Fat Layer (Subcutaneous Tissue) Exposed exposed. There is no tunneling or undermining noted. There is a medium amount of serosanguineous drainage noted. There is no granulation within the wound bed. There is a large (67-100%) amount of necrotic tissue within the wound bed including Eschar. Assessment Active Problems ICD-10 Type 2 diabetes mellitus with foot ulcer Non-pressure chronic ulcer of other part of left foot with necrosis of muscle Type 2 diabetes mellitus with diabetic polyneuropathy Charcot's joint, left ankle and foot Procedures Wound #1 Pre-procedure diagnosis of Wound #1 is a Diabetic  Wound/Ulcer of the Lower Extremity located on the [place] .Severity of Tissue Pre Debridement is: Muscle involvement without necrosis. There was a Excisional Skin/Subcutaneous Tissue Debridement with a total area of 1.2 sq cm performed by Ricard Dillon, MD. With the following instrument(s): Curette to remove Viable and Non-Viable tissue/material. Material removed includes Callus and Subcutaneous Tissue and after achieving pain control using Lidocaine. No specimens were taken. A time out was conducted at 15:30, prior to the start of the procedure. A Minimum amount of bleeding was controlled with Pressure. The procedure was tolerated well. Post Debridement Measurements: 1.2cm length x 1cm width x 2.5cm depth; 2.356cm^3 volume. Character of Wound/Ulcer Post Debridement is stable. Severity of Tissue Post Debridement is: Fat layer exposed. Post procedure Diagnosis Wound #1: Same as Pre-Procedure SEVANNAH, HUARACHA. ([id number]) Plan Wound Cleansing: Wound #1 Left,Plantar Foot: May shower with protection. - Do not get dressing wet. No tub bath. Anesthetic (add to Medication List): Wound #1 Left,Plantar Foot: Topical Lidocaine 4% cream applied to wound bed prior to debridement (In Clinic Only). Primary Wound Dressing: Wound #1 Left,Plantar Foot: Silver Alginate Wound #2 Left,Plantar Toe Great: Other: - Coverlet Secondary Dressing: Wound #1 Left,Plantar Foot: ABD and Kerlix/Conform Dressing Change Frequency: Wound #1 Left,Plantar Foot: Change Dressing Monday, Wednesday, Friday Follow-up Appointments: Wound #1 Left,Plantar Foot: Return Appointment in 1 week. Wound #2 Left,Plantar Toe Great: Return Appointment in 1 week. Off-Loading: Wound #1 Left,Plantar Foot: Heel suspension boot Additional Orders / Instructions: Wound #1 Left,Plantar Foot: Increase protein intake. Medications-please add to medication list.: Wound #1 Left,Plantar Foot: P.O. Antibiotics Wound #2  Left,Plantar Toe Great: P.O. Antibiotics Laboratory ordered were: Wound culture routine - Left plantar heel Radiology ordered were: MRI, lower extremity without contrast - Left plantar heel 1. This is a complicated wound. Fortunately we do not seem to have an arterial problem 2. She has had 2 x-rays of this area done in late January but I would like to proceed with an MRI at this point. The extent of this and whether there is underlying osteomyelitis that we will determine the best course of treatment here. 3. We put her in a heel offloading boot but with some concerns about her gait and balance. She had a straight cane I have asked her to use a walker when she gets home. I suspect significant diabetic neuropathy. 4. I did not disturb the eschared part of her left great toe but I would remove this next week to make sure that this is healed underneath. 5. Swab culture of the deep part of this wound for CandS I am going to give her empiric doxycycline while we await culture results there is still some erythema around the wound and I am concerned about continued soft tissue infection I spent 45 minutes in the review of this patient's records, face-to-face evaluation preparation of this record Electronic Signature(s) Signed: 05/31/2019 5:25:14 PM By: Linton Ham MD  Entered By: Linton Ham on 05/31/2019 16:48:03 [name] ([id number]) -------------------------------------------------------------------------------- ROS/PFSH Details Patient Name: [name] Date of Service: 05/31/2019 2:15 PM Medical Record Number: [id number] Patient Account Number: [id number] Date of Birth/Sex: 1945/03/28 (74 y.o. F) Treating RN: Army Melia Primary Care Provider: Vicenta Aly Other Clinician: Referring Provider: Treating Provider/Extender: Tito Dine in Treatment: 0 Information Obtained From Patient Constitutional Symptoms (General Health) Complaints and  Symptoms: Negative for: Fatigue; Fever; Chills; Marked Weight Change Eyes Complaints and Symptoms: Negative for: Dry Eyes; Vision Changes; Glasses / Contacts Medical History: Positive for: Cataracts - removed Negative for: Glaucoma; Optic Neuritis Ear/Nose/Mouth/Throat Complaints and Symptoms: Negative for: Difficult clearing ears; Sinusitis Medical History: Negative for: Chronic sinus problems/congestion; Middle ear problems Hematologic/Lymphatic Complaints and Symptoms: Negative for: Bleeding / Clotting Disorders; Human Immunodeficiency Virus Medical History: Negative for: Anemia; Hemophilia; Human Immunodeficiency Virus; Lymphedema; Sickle Cell Disease Respiratory Complaints and Symptoms: Negative for: Chronic or frequent coughs; Shortness of Breath Medical History: Negative for: Aspiration; Asthma; Chronic Obstructive Pulmonary Disease (COPD); Pneumothorax; Sleep Apnea; Tuberculosis Cardiovascular Complaints and Symptoms: Negative for: Chest pain; LE edema Medical History: Positive for: Hypertension Negative for: Angina; Arrhythmia; Congestive Heart Failure; Coronary Artery Disease; Deep Vein Thrombosis; Hypotension; Myocardial Infarction; Peripheral Arterial Disease; Peripheral Venous Disease; Phlebitis; Vasculitis Gastrointestinal Complaints and Symptoms: Negative for: Frequent diarrhea; Nausea; Vomiting Medical History: Negative for: Cirrhosis ; Colitis; Crohnos; Hepatitis A; Hepatitis B; Hepatitis C Endocrine Peto, Maragret L. ([id number]) Complaints and Symptoms: Negative for: Hepatitis; Thyroid disease; Polydypsia (Excessive Thirst) Medical History: Positive for: Type II Diabetes Time with diabetes: 7 years Treated with: Insulin, Oral agents Genitourinary Complaints and Symptoms: Negative for: Kidney failure/ Dialysis; Incontinence/dribbling Medical History: Negative for: End Stage Renal Disease Immunological Complaints and Symptoms: Negative for: Hives;  Itching Medical History: Negative for: Lupus Erythematosus; Raynaudos; Scleroderma Integumentary (Skin) Complaints and Symptoms: Positive for: Wounds Negative for: Bleeding or bruising tendency; Breakdown; Swelling Medical History: Negative for: History of Burn; History of pressure wounds Musculoskeletal Complaints and Symptoms: Negative for: Muscle Pain; Muscle Weakness Medical History: Positive for: Osteoarthritis Negative for: Gout; Rheumatoid Arthritis; Osteomyelitis Neurologic Complaints and Symptoms: Negative for: Numbness/parasthesias; Focal/Weakness Medical History: Negative for: Dementia; Neuropathy; Quadriplegia; Paraplegia; Seizure Disorder Psychiatric Complaints and Symptoms: Negative for: Anxiety; Claustrophobia Medical History: Negative for: Anorexia/bulimia; Confinement Anxiety Oncologic Medical History: Negative for: Received Chemotherapy; Received Radiation HBO Extended History Items Eyes: Cataracts Immunizations DESHONA, TESCHNER ([id number]) Pneumococcal Vaccine: Received Pneumococcal Vaccination: Yes Implantable Devices None Family and Social History Cancer: Yes - Siblings; Diabetes: Yes - Siblings; Heart Disease: Yes - Father,Mother; Hereditary Spherocytosis: No; Hypertension: Yes - Father,Mother; Kidney Disease: No; Lung Disease: No; Seizures: No; Stroke: No; Thyroid Problems: No; Tuberculosis: No; Never smoker; Marital Status - Widowed; Alcohol Use: Never; Drug Use: No History; Caffeine Use: Never; Financial Concerns: No; Food, Clothing or Shelter Needs: No; Support System Lacking: No; Transportation Concerns: No Electronic Signature(s) Signed: 05/31/2019 5:25:14 PM By: Linton Ham MD Signed: 06/01/2019 9:13:28 AM By: Army Melia Entered By: Army Melia on 05/31/2019 15:02:40 [name] ([id number]) -------------------------------------------------------------------------------- SuperBill Details Patient Name: SHERIDA, TREVILLIAN L. Date of  Service: 05/31/2019 Medical Record Number: [id number] Patient Account Number: [id number] Date of Birth/Sex: 04/18/1945 (74 y.o. F) Treating RN: Cornell Barman Primary Care Provider: Vicenta Aly Other Clinician: Referring Provider: Treating Provider/Extender: Tito Dine in Treatment: 0 Diagnosis Coding ICD-10 Codes Code Description E11.621 Type 2 diabetes mellitus with foot ulcer L97.523 Non-pressure chronic ulcer of other part of left foot with necrosis of muscle E11.42  Type 2 diabetes mellitus with diabetic polyneuropathy M14.672 Charcot's joint, left ankle and foot Facility Procedures CPT4 Code: [id number] Description: [id number] - WOUND CARE VISIT-LEV 3 EST PT Modifier: Quantity: 1 CPT4 Code: [id number] Description: [id number] - DEB SUBQ TISSUE 20 SQ CM/< Modifier: Quantity: 1 CPT4 Code: Description: ICD-10 Diagnosis Description E11.621 Type 2 diabetes mellitus with foot ulcer L97.523 Non-pressure chronic ulcer of other part of left foot with necrosis of muscl Modifier: e Quantity: Physician Procedures CPT4 [id number] Description: [id number] - WC PHYS LEVEL 5 - EST PT Modifier: 25 Quantity: 1 CPT4 Code: Description: ICD-10 Diagnosis Description E11.621 Type 2 diabetes mellitus with foot ulcer L97.523 Non-pressure chronic ulcer of other part of left foot with necrosis of musc E11.42 Type 2 diabetes mellitus with diabetic polyneuropathy M14.672 Charcot's  joint, left ankle and foot Modifier: le Quantity: CPT4 Code: [id number] Description: 11042 - WC PHYS SUBQ TISS 20 SQ CM Modifier: Quantity: 1 CPT4 Code: Description: ICD-10 Diagnosis Description E11.621 Type 2 diabetes mellitus with foot ulcer L97.523 Non-pressure chronic ulcer of other part of left foot with necrosis of musc Modifier: le Quantity: Electronic Signature(s) Unsigned Previous Signature: 05/31/2019 5:25:14 PM Version By: Linton Ham MD Entered By: Linton Ham on 06/01/2019  07:46:53 Signature(s): Date(s):

## 2019-06-01 NOTE — Progress Notes (Signed)
Debra, [name] ([id number]) Visit Report for 05/31/2019 Abuse/Suicide Risk Screen Details Patient Name: Debra [name], Debra [name]. Date of Service: 05/31/2019 2:15 PM Medical Record Number: [id number] Patient Account Number: [id number] Date of Birth/Sex: June 08, 1945 (74 y.o. F) Treating RN: Army Melia Primary Care Posie Lillibridge: Vicenta Aly Other Clinician: Referring Ikram Riebe: Treating Lelani Garnett/Extender: Tito Dine in Treatment: 0 Abuse/Suicide Risk Screen Items Answer ABUSE RISK SCREEN: Has anyone close to you tried to hurt or harm you recentlyo No Do you feel uncomfortable with anyone in your familyo No Has anyone forced you do things that you didnot want to doo No Electronic Signature(s) Signed: 06/01/2019 9:13:28 AM By: Army Melia Entered By: Army Melia on 05/31/2019 15:02:56 Yoshida, Estanislado Spire ([id number]) -------------------------------------------------------------------------------- Activities of Daily Living Details Patient Name: [name], [name] Date of Service: 05/31/2019 2:15 PM Medical Record Number: [id number] Patient Account Number: [id number] Date of Birth/Sex: June 27, 1945 (74 y.o. F) Treating RN: Army Melia Primary Care Dyesha Henault: Vicenta Aly Other Clinician: Referring Lira Stephen: Treating Annamay Laymon/Extender: Tito Dine in Treatment: 0 Activities of Daily Living Items Answer Activities of Daily Living (Please select one for each item) Drive Automobile Completely Able Take Medications Completely Able Use Telephone Completely Able Care for Appearance Completely Able Use Toilet Completely Able Bath / Shower Completely Able Dress Self Completely Able Feed Self Completely Able Walk Completely Able Get In / Out Bed Completely Able Housework Completely Able Prepare Meals Completely Able Handle Money Completely Able Shop for Self Completely Able Electronic Signature(s) Signed: 06/01/2019 9:13:28 AM By: Army Melia Entered By: Army Melia on  05/31/2019 15:03:13 Debra [name] ([id number]) -------------------------------------------------------------------------------- Education Screening Details Patient Name: [name] Date of Service: 05/31/2019 2:15 PM Medical Record Number: [id number] Patient Account Number: [id number] Date of Birth/Sex: 30-Aug-1945 (74 y.o. F) Treating RN: Army Melia Primary Care Ara Grandmaison: Vicenta Aly Other Clinician: Referring Tyshauna Finkbiner: Treating Candita Borenstein/Extender: Tito Dine in Treatment: 0 Primary Learner Assessed: Patient Learning Preferences/Education Level/Primary Language Learning Preference: Explanation, Demonstration Highest Education Level: High School Preferred Language: English Cognitive Barrier Language Barrier: No Translator Needed: No Memory Deficit: No Emotional Barrier: No Cultural/Religious Beliefs Affecting Medical Care: No Physical Barrier Impaired Vision: No Impaired Hearing: No Decreased Hand dexterity: No Knowledge/Comprehension Knowledge Level: High Comprehension Level: High Ability to understand written instructions: High Ability to understand verbal instructions: High Motivation Anxiety Level: Calm Cooperation: Cooperative Education Importance: Acknowledges Need Interest in Health Problems: Asks Questions Perception: Coherent Willingness to Engage in Self-Management High Activities: Readiness to Engage in Self-Management High Activities: Electronic Signature(s) Signed: 06/01/2019 9:13:28 AM By: Army Melia Entered By: Army Melia on 05/31/2019 15:03:32 Debra [name] ([id number]) -------------------------------------------------------------------------------- Fall Risk Assessment Details Patient Name: Debra [name]. Date of Service: 05/31/2019 2:15 PM Medical Record Number: [id number] Patient Account Number: [id number] Date of Birth/Sex: 03-21-1946 (74 y.o. F) Treating RN: Army Melia Primary Care Toryn Mcclinton: Vicenta Aly  Other Clinician: Referring Onyekachi Gathright: Treating Khamila Bassinger/Extender: Tito Dine in Treatment: 0 Fall Risk Assessment Items Have you had 2 or more falls in the last 12 monthso 0 No Have you had any fall that resulted in injury in the last 12 monthso 0 No FALLS RISK SCREEN History of falling - immediate or within 3 months 0 No Secondary diagnosis (Do you have 2 or more medical diagnoseso) 0 No Ambulatory aid None/bed rest/wheelchair/nurse 0 No Crutches/cane/walker 15 Yes Furniture 0 No Intravenous therapy Access/Saline/Heparin Lock 0 No Gait/Transferring Normal/ bed rest/ wheelchair 0 No Weak (short steps with or without shuffle, stooped but able to  lift head while walking, may seek 10 Yes support from furniture) Impaired (short steps with shuffle, may have difficulty arising from chair, head down, impaired 0 No balance) Mental Status Oriented to own ability 0 No Electronic Signature(s) Signed: 06/01/2019 9:13:28 AM By: Army Melia Entered By: Army Melia on 05/31/2019 15:03:44 Tall, Estanislado Spire ([id number]) -------------------------------------------------------------------------------- Foot Assessment Details Patient Name: [name], [name] Date of Service: 05/31/2019 2:15 PM Medical Record Number: [id number] Patient Account Number: [id number] Date of Birth/Sex: 08-09-1945 (74 y.o. F) Treating RN: Army Melia Primary Care Charitie Hinote: Vicenta Aly Other Clinician: Referring Dezmen Alcock: Treating Aleni Andrus/Extender: Tito Dine in Treatment: 0 Foot Assessment Items Site Locations + = Sensation present, - = Sensation absent, C = Callus, U = Ulcer R = Redness, W = Warmth, M = Maceration, PU = Pre-ulcerative lesion F = Fissure, S = Swelling, D = Dryness Assessment Right: Left: Other Deformity: No No Prior Foot Ulcer: No No Prior Amputation: No No Charcot Joint: No No Ambulatory Status: Ambulatory With Help Assistance Device: Cane Gait: Electronic  Signature(s) Signed: 06/01/2019 9:13:28 AM By: Army Melia Entered By: Army Melia on 05/31/2019 15:04:53 Debra [name] ([id number]) -------------------------------------------------------------------------------- Nutrition Risk Screening Details Patient Name: TRUDO, Eldred L. Date of Service: 05/31/2019 2:15 PM Medical Record Number: [id number] Patient Account Number: [id number] Date of Birth/Sex: 08-Mar-1946 (74 y.o. F) Treating RN: Army Melia Primary Care Ellanor Feuerstein: Vicenta Aly Other Clinician: Referring Maliki Gignac: Treating Ichael Pullara/Extender: Tito Dine in Treatment: 0 Height (in): 64 Weight (lbs): 230 Body Mass Index (BMI): 39.5 Nutrition Risk Screening Items Score Screening NUTRITION RISK SCREEN: I have an illness or condition that made me change the kind and/or amount of food I eat 0 No I eat fewer than two meals per day 0 No I eat few fruits and vegetables, or milk products 0 No I have three or more drinks of beer, liquor or wine almost every day 0 No I have tooth or mouth problems that make it hard for me to eat 0 No I don't always have enough money to buy the food I need 0 No I eat alone most of the time 0 No I take three or more different prescribed or over-the-counter drugs a day 0 No Without wanting to, I have lost or gained 10 pounds in the last six months 0 No I am not always physically able to shop, cook and/or feed myself 0 No Nutrition Protocols Good Risk Protocol 0 No interventions needed Moderate Risk Protocol High Risk Proctocol Risk Level: Good Risk Score: 0 Electronic Signature(s) Signed: 06/01/2019 9:13:28 AM By: Army Melia Entered By: Army Melia on 05/31/2019 15:03:54

## 2019-06-03 LAB — AEROBIC CULTURE? (SUPERFICIAL SPECIMEN)

## 2019-06-03 LAB — AEROBIC CULTURE W GRAM STAIN (SUPERFICIAL SPECIMEN)

## 2019-06-07 ENCOUNTER — Encounter: Payer: Medicare Other | Admitting: Internal Medicine

## 2019-06-07 ENCOUNTER — Other Ambulatory Visit: Payer: Self-pay

## 2019-06-07 DIAGNOSIS — E11621 Type 2 diabetes mellitus with foot ulcer: Secondary | ICD-10-CM | POA: Diagnosis not present

## 2019-06-09 ENCOUNTER — Ambulatory Visit (HOSPITAL_COMMUNITY): Payer: Medicare Other

## 2019-06-09 NOTE — Progress Notes (Signed)
JACKLYNN, DEHAAS ([id number]) Visit Report for 06/07/2019 Arrival Information Details Patient Name: MAURINE, MOWBRAY. Date of Service: 06/07/2019 10:00 AM Medical Record Number: [id number] Patient Account Number: [id number] Date of Birth/Sex: [birth year] (73 y.o. Female) Treating RN: Cornell Barman Primary Care Obdulia Steier: Vicenta Aly Other Clinician: Referring Jeevan Kalla: Vicenta Aly Treating Jemari Hallum/Extender: Tito Dine in Treatment: 1 Visit Information History Since Last Visit Added or deleted any medications: No Patient Arrived: Kasandra Knudsen Any new allergies or adverse reactions: No Arrival Time: 09:51 Had a fall or experienced change in No Accompanied By: self activities of daily living that may affect Transfer Assistance: None risk of falls: Patient Identification Verified: Yes Signs or symptoms of abuse/neglect since last visito No Secondary Verification Process Completed: Yes Hospitalized since last visit: No Implantable device outside of the clinic excluding No cellular tissue based products placed in the center since last visit: Has Dressing in Place as Prescribed: Yes Pain Present Now: No Electronic Signature(s) Signed: 06/07/2019 4:00:59 PM By: Lorine Bears RCP, RRT, CHT Entered By: Lorine Bears on 06/07/2019 [place], [place]. ([id number]) -------------------------------------------------------------------------------- Encounter Discharge Information Details Patient Name: LEYANNA, BITTMAN L. Date of Service: 06/07/2019 10:00 AM Medical Record Number: [id number] Patient Account Number: [id number] Date of Birth/Sex: Jan 17, 1946 (73 y.o. Female) Treating RN: Cornell Barman Primary Care Augustin Bun: Vicenta Aly Other Clinician: Referring Teesha Ohm: Vicenta Aly Treating Iline Buchinger/Extender: Tito Dine in Treatment: 1 Encounter Discharge Information Items Post Procedure Vitals Discharge Condition: Stable Temperature  (F): 98.5 Ambulatory Status: Cane Pulse (bpm): 85 Discharge Destination: Home Respiratory Rate (breaths/min): 18 Transportation: Private Auto Blood Pressure (mmHg): 152/69 Accompanied By: self Schedule Follow-up Appointment: Yes Clinical Summary of Care: Electronic Signature(s) Signed: 06/09/2019 5:21:44 PM By: Gretta Cool, BSN, RN, CWS, Kim RN, BSN Entered By: Gretta Cool, BSN, RN, CWS, Kim on 06/07/2019 10:22:16 [name] ([id number]) -------------------------------------------------------------------------------- Lower Extremity Assessment Details Patient Name: SANE, Yahira L. Date of Service: 06/07/2019 10:00 AM Medical Record Number: [id number] Patient Account Number: [id number] Date of Birth/Sex: 04-06-1945 (73 y.o. Female) Treating RN: Montey Hora Primary Care Zanna Hawn: Vicenta Aly Other Clinician: Referring Bryston Colocho: Vicenta Aly Treating Terri Malerba/Extender: Ricard Dillon Weeks in Treatment: 1 Edema Assessment Assessed: [Left: No] [Right: No] Edema: [Left: Ye] [Right: s] Vascular Assessment Pulses: Dorsalis Pedis Palpable: [Left:Yes] Electronic Signature(s) Signed: 06/07/2019 4:39:55 PM By: Montey Hora Entered By: Montey Hora on 06/07/2019 10:03:58 [name] ([id number]) -------------------------------------------------------------------------------- Multi Wound Chart Details Patient Name: [name] Date of Service: 06/07/2019 10:00 AM Medical Record Number: [id number] Patient Account Number: [id number] Date of Birth/Sex: July 02, 1945 (73 y.o. Female) Treating RN: Cornell Barman Primary Care Shawnna Pancake: Vicenta Aly Other Clinician: Referring Bessye Stith: Vicenta Aly Treating Brynn Reznik/Extender: Tito Dine in Treatment: 1 Vital Signs Height(in): 64 Pulse(bpm): 2 Weight(lbs): 230 Blood Pressure(mmHg): 152/69 Body Mass Index(BMI): 39 Temperature(F): 98.5 Respiratory Rate(breaths/min): 18 Photos: [N/A:N/A] Wound Location:  Left Foot - Plantar Left Toe Great - Plantar N/A Wounding Event: Gradually Appeared Gradually Appeared N/A Primary Etiology: Diabetic Wound/Ulcer of the Lower Diabetic Wound/Ulcer of the Lower N/A Extremity Extremity Comorbid History: Cataracts, Hypertension, Type II Cataracts, Hypertension, Type II N/A Diabetes, Osteoarthritis Diabetes, Osteoarthritis Date Acquired: 05/09/2019 05/09/2019 N/A Weeks of Treatment: 1 1 N/A Wound Status: Open Open N/A Measurements L x W x D (cm) 0.4x0.6x1.7 0.1x0.3x0.5 N/A Area (cm) : 0.188 0.024 N/A Volume (cm) : 0.32 0.012 N/A % Reduction in Area: 33.60% 87.80% N/A % Reduction in Volume: 56.50% 40.00% N/A Position 1 (o'clock): 6 Maximum Distance 1 (cm): 1.9 Tunneling: Yes  No N/A Classification: Grade 2 Unable to visualize wound bed N/A Exudate Amount: Medium Medium N/A Exudate Type: Serosanguineous Serosanguineous N/A Exudate Color: red, brown red, brown N/A Foul Odor After Cleansing: Yes No N/A Odor Anticipated Due to Product No N/A N/A Use: Wound Margin: Flat and Intact N/A N/A Granulation Amount: Large (67-100%) Small (1-33%) N/A Granulation Quality: Pink Pink N/A Necrotic Amount: Small (1-33%) Large (67-100%) N/A Exposed Structures: Fat Layer (Subcutaneous Tissue) Fat Layer (Subcutaneous Tissue) N/A Exposed: Yes Exposed: Yes Fascia: No Fascia: No Tendon: No Tendon: No Muscle: No Muscle: No Joint: No Joint: No Bone: No Bone: No Epithelialization: None None N/A Debridement: Debridement - Excisional Debridement - Excisional N/A 10:16 10:16 N/A CEDRA, VILLALON ([id number]) Pre-procedure Verification/Time Out Taken: Pain Control: Lidocaine Lidocaine N/A Tissue Debrided: Subcutaneous, Slough Subcutaneous, Slough N/A Level: Skin/Subcutaneous Tissue Skin/Subcutaneous Tissue N/A Debridement Area (sq cm): 0.24 0.03 N/A Instrument: Curette Curette N/A Bleeding: Moderate Moderate N/A Hemostasis Achieved: Pressure Pressure N/A Debridement  Treatment Procedure was tolerated well Procedure was tolerated well N/A Response: Post Debridement Measurements 0.4x0.6x1.7 0.1x0.3x0.5 N/A L x W x D (cm) Post Debridement Volume: (cm) 0.32 0.012 N/A Procedures Performed: Debridement Debridement N/A Treatment Notes Wound #1 (Left, Plantar Foot) 1. Cleansed with: Clean wound with Normal Saline 2. Anesthetic Topical Lidocaine 4% cream to wound bed prior to debridement 4. Dressing Applied: Other dressing (specify in notes) 5. Secondary Dressing Applied ABD and Kerlix/Conform 6. Footwear/Offloading device applied Wedge shoe 7. Secured with Tape Wound #2 (Left, Plantar Toe Great) 1. Cleansed with: Clean wound with Normal Saline 2. Anesthetic Topical Lidocaine 4% cream to wound bed prior to debridement 4. Dressing Applied: Other dressing (specify in notes) 5. Secondary Dressing Applied ABD and Kerlix/Conform 6. Footwear/Offloading device applied Wedge shoe 7. Secured with Recruitment consultant) Signed: 06/07/2019 5:07:28 PM By: Linton Ham MD Entered By: Linton Ham on 06/07/2019 10:26:05 [name] ([id number]) -------------------------------------------------------------------------------- [place] Details Patient Name: SHACONDA, HAJDUK. Date of Service: 06/07/2019 10:00 AM Medical Record Number: [id number] Patient Account Number: [id number] Date of Birth/Sex: March 26, 1945 (73 y.o. Female) Treating RN: Cornell Barman Primary Care Mykeisha Dysert: Vicenta Aly Other Clinician: Referring Damone Fancher: Vicenta Aly Treating Estell Dillinger/Extender: Tito Dine in Treatment: 1 Active Inactive Necrotic Tissue Nursing Diagnoses: Impaired tissue integrity related to necrotic/devitalized tissue Goals: Necrotic/devitalized tissue will be minimized in the wound bed Date Initiated: 05/31/2019 Target Resolution Date: 06/14/2019 Goal Status: Active Patient/caregiver will verbalize understanding of  reason and process for debridement of necrotic tissue Date Initiated: 05/31/2019 Target Resolution Date: 06/14/2019 Goal Status: Active Interventions: Assess patient pain level pre-, during and post procedure and prior to discharge Treatment Activities: Apply topical anesthetic as ordered : 05/31/2019 Excisional debridement : 05/31/2019 Notes: Pain, Acute or Chronic Nursing Diagnoses: Pain, acute or chronic: actual or potential Goals: Patient/caregiver will verbalize comfort level met Date Initiated: 05/31/2019 Target Resolution Date: 06/21/2019 Goal Status: Active Interventions: Assess comfort goal upon admission Treatment Activities: Administer pain control measures as ordered : 05/31/2019 Notes: Peripheral Neuropathy Nursing Diagnoses: Knowledge deficit related to disease process and management of peripheral neurovascular dysfunction Goals: Patient/caregiver will verbalize understanding of disease process and disease management Date Initiated: 05/31/2019 Target Resolution Date: 06/14/2019 Goal Status: Active HARUYE, LAINEZ ([id number]) Interventions: Assess signs and symptoms of neuropathy upon admission and as needed Notes: Wound/Skin Impairment Nursing Diagnoses: Impaired tissue integrity Goals: Patient/caregiver will verbalize understanding of skin care regimen Date Initiated: 05/31/2019 Target Resolution Date: 06/14/2019 Goal Status: Active Ulcer/skin breakdown will have a volume  reduction of 30% by week 4 Date Initiated: 05/31/2019 Target Resolution Date: 07/01/2019 Goal Status: Active Interventions: Assess patient/caregiver ability to obtain necessary supplies Provide education on ulcer and skin care Notes: Electronic Signature(s) Signed: 06/09/2019 5:21:44 PM By: Gretta Cool, BSN, RN, CWS, Kim RN, BSN Entered By: Gretta Cool, BSN, RN, CWS, Kim on 06/07/2019 10:11:54 [name]  ([id number]) -------------------------------------------------------------------------------- Pain Assessment Details Patient Name: MARVELLE, SPAN L. Date of Service: 06/07/2019 10:00 AM Medical Record Number: [id number] Patient Account Number: [id number] Date of Birth/Sex: 02-Nov-1945 (73 y.o. Female) Treating RN: Montey Hora Primary Care Danon Lograsso: Vicenta Aly Other Clinician: Referring Keanon Bevins: Vicenta Aly Treating Jaliel Deavers/Extender: Tito Dine in Treatment: 1 Active Problems Location of Pain Severity and Description of Pain Patient Has Paino No Site Locations Pain Management and Medication Current Pain Management: Electronic Signature(s) Signed: 06/07/2019 4:39:55 PM By: Montey Hora Entered By: Montey Hora on 06/07/2019 10:03:46 [name] ([id number]) -------------------------------------------------------------------------------- Patient/Caregiver Education Details Patient Name: ILLYRIA, SOBOCINSKI L. Date of Service: 06/07/2019 10:00 AM Medical Record Number: [id number] Patient Account Number: [id number] Date of Birth/Gender: 02/17/1946 (73 y.o. Female) Treating RN: Cornell Barman Primary Care Physician: Vicenta Aly Other Clinician: Referring Physician: Vicenta Aly Treating Physician/Extender: Tito Dine in Treatment: 1 Education Assessment Education Provided To: Patient Education Topics Provided Wound Debridement: Handouts: Wound Debridement Methods: Demonstration, Explain/Verbal Responses: State content correctly Electronic Signature(s) Signed: 06/09/2019 5:21:44 PM By: Gretta Cool, BSN, RN, CWS, Kim RN, BSN Entered By: Gretta Cool, BSN, RN, CWS, Kim on 06/07/2019 10:19:33 [name] ([id number]) -------------------------------------------------------------------------------- Wound Assessment Details Patient Name: NAYDELIN, ZIEGLER L. Date of Service: 06/07/2019 10:00 AM Medical Record Number: [id number] Patient Account Number:  [id number] Date of Birth/Sex: October 11, 1945 (73 y.o. Female) Treating RN: Montey Hora Primary Care Philopater Mucha: Vicenta Aly Other Clinician: Referring Rexford Prevo: Vicenta Aly Treating Colt Martelle/Extender: Tito Dine in Treatment: 1 Wound Status Wound Number: 1 Primary Etiology: Diabetic Wound/Ulcer of the Lower Extremity Wound Location: Left Foot - Plantar Wound Status: Open Wounding Event: Gradually Appeared Comorbid Cataracts, Hypertension, Type II Diabetes, History: Osteoarthritis Date Acquired: 05/09/2019 Weeks Of Treatment: 1 Clustered Wound: No Photos Wound Measurements Length: (cm) 0.4 % Reduct Width: (cm) 0.6 % Reduct Depth: (cm) 1.7 Epitheli Area: (cm) 0.188 Tunneli Volume: (cm) 0.32 Posi Maxim ion in Area: 33.6% ion in Volume: 56.5% alization: None ng: Yes tion (o'clock): 6 um Distance: (cm) 1.9 Undermining: No Wound Description Classification: Grade 2 Foul Odo Wound Margin: Flat and Intact Due to P Exudate Amount: Medium Slough/F Exudate Type: Serosanguineous Exudate Color: red, brown r After Cleansing: Yes roduct Use: No ibrino Yes Wound Bed Granulation Amount: Large (67-100%) Exposed Structure Granulation Quality: Pink Fascia Exposed: No Necrotic Amount: Small (1-33%) Fat Layer (Subcutaneous Tissue) Exposed: Yes Necrotic Quality: Adherent Slough Tendon Exposed: No Muscle Exposed: No Joint Exposed: No Bone Exposed: No Treatment Notes Wound #1 (Left, Plantar Foot) 1. Cleansed with: Clean wound with Normal Saline Cambre, Diannie L. ([id number]) 2. Anesthetic Topical Lidocaine 4% cream to wound bed prior to debridement 4. Dressing Applied: Other dressing (specify in notes) 5. Secondary Dressing Applied ABD and Kerlix/Conform 6. Footwear/Offloading device applied Wedge shoe 7. Secured with Recruitment consultant) Signed: 06/07/2019 4:39:55 PM By: Montey Hora Entered By: Montey Hora on 06/07/2019 10:00:29 [name] ([id number]) -------------------------------------------------------------------------------- Wound Assessment Details Patient Name: NORENE, OLIVERI L. Date of Service: 06/07/2019 10:00 AM Medical Record Number: [id number] Patient Account Number: [id number] Date of Birth/Sex: 02/28/1946 (73 y.o. Female) Treating RN: Montey Hora Primary Care Arie Gable: Vicenta Aly Other Clinician:  Referring Sabir Charters: Vicenta Aly Treating Lylah Lantis/Extender: Tito Dine in Treatment: 1 Wound Status Wound Number: 2 Primary Etiology: Diabetic Wound/Ulcer of the Lower Extremity Wound Location: Left Toe Great - Plantar Wound Status: Open Wounding Event: Gradually Appeared Comorbid Cataracts, Hypertension, Type II Diabetes, History: Osteoarthritis Date Acquired: 05/09/2019 Weeks Of Treatment: 1 Clustered Wound: No Photos Wound Measurements Length: (cm) 0.1 Width: (cm) 0.3 Depth: (cm) 0.5 Area: (cm) 0.024 Volume: (cm) 0.012 % Reduction in Area: 87.8% % Reduction in Volume: 40% Epithelialization: None Tunneling: No Undermining: No Wound Description Classification: Unable to visualize wound bed Exudate Amount: Medium Exudate Type: Serosanguineous Exudate Color: red, brown Foul Odor After Cleansing: No Slough/Fibrino Yes Wound Bed Granulation Amount: Small (1-33%) Exposed Structure Granulation Quality: Pink Fascia Exposed: No Necrotic Amount: Large (67-100%) Fat Layer (Subcutaneous Tissue) Exposed: Yes Necrotic Quality: Adherent Slough Tendon Exposed: No Muscle Exposed: No Joint Exposed: No Bone Exposed: No Treatment Notes Wound #2 (Left, Plantar Toe Great) 1. Cleansed with: Clean wound with Normal Saline 2. Anesthetic Topical Lidocaine 4% cream to wound bed prior to debridement 4. Dressing Applied: Other dressing (specify in notes) [place], Kaedance L. ([id number]) 5. Secondary Dressing Applied ABD and Kerlix/Conform 6. Footwear/Offloading device applied Wedge  shoe 7. Secured with Recruitment consultant) Signed: 06/07/2019 4:39:55 PM By: Montey Hora Entered By: Montey Hora on 06/07/2019 10:02:54 [name] ([id number]) -------------------------------------------------------------------------------- [place] Details Patient Name: [name]. Date of Service: 06/07/2019 10:00 AM Medical Record Number: [id number] Patient Account Number: [id number] Date of Birth/Sex: 1945/07/11 (73 y.o. Female) Treating RN: Cornell Barman Primary Care Grabiela Wohlford: Vicenta Aly Other Clinician: Referring Helio Lack: Vicenta Aly Treating Nailani Full/Extender: Tito Dine in Treatment: 1 Vital Signs Time Taken: 09:50 Temperature (F): 98.5 Height (in): 64 Pulse (bpm): 85 Weight (lbs): 230 Respiratory Rate (breaths/min): 18 Body Mass Index (BMI): 39.5 Blood Pressure (mmHg): 152/69 Reference Range: 80 - 120 mg / dl Electronic Signature(s) Signed: 06/07/2019 4:00:59 PM By: Lorine Bears RCP, RRT, CHT Entered By: Lorine Bears on 06/07/2019 09:55:57

## 2019-06-09 NOTE — Progress Notes (Signed)
GLORIANNA, CRANNELL ([id number]) Visit Report for 06/07/2019 Debridement Details Patient Name: Debra [name], Debra [name]. Date of Service: 06/07/2019 10:00 AM Medical Record Number: [id number] Patient Account Number: [id number] Date of Birth/Sex: 1946/03/12 (73 y.o. Female) Treating RN: Cornell Barman Primary Care Provider: Vicenta Aly Other Clinician: Referring Provider: Vicenta Aly Treating Provider/Extender: Tito Dine in Treatment: 1 Debridement Performed for Wound #1 Left,Plantar Foot Assessment: Performed By: Physician Ricard Dillon, MD Debridement Type: Debridement Severity of Tissue Pre Debridement: Fat layer exposed Level of Consciousness (Pre- Awake and Alert procedure): Pre-procedure Verification/Time Out Yes - 10:16 Taken: Start Time: 10:16 Pain Control: Lidocaine Total Area Debrided (L x W): 0.4 (cm) x 0.6 (cm) = 0.24 (cm) Tissue and other material debrided: Viable, Slough, Subcutaneous, Slough Level: Skin/Subcutaneous Tissue Debridement Description: Excisional Instrument: Curette Bleeding: Moderate Hemostasis Achieved: Pressure End Time: 10:16 Response to Treatment: Procedure was tolerated well Level of Consciousness (Post- Awake and Alert procedure): Post Debridement Measurements of Total Wound Length: (cm) 0.4 Width: (cm) 0.6 Depth: (cm) 1.7 Volume: (cm) 0.32 Character of Wound/Ulcer Post Debridement: Stable Severity of Tissue Post Debridement: Limited to breakdown of skin Post Procedure Diagnosis Same as Pre-procedure Electronic Signature(s) Signed: 06/07/2019 5:07:28 PM By: Linton Ham MD Signed: 06/09/2019 5:21:44 PM By: Gretta Cool, BSN, RN, CWS, Kim RN, BSN Entered By: Linton Ham on 06/07/2019 10:24:58 [name] ([id number]) -------------------------------------------------------------------------------- Debridement Details Patient Name: Debra [name], [name] Date of Service: 06/07/2019 10:00 AM Medical Record Number:  [id number] Patient Account Number: [id number] Date of Birth/Sex: 17-Jan-1946 (73 y.o. Female) Treating RN: Cornell Barman Primary Care Provider: Vicenta Aly Other Clinician: Referring Provider: Vicenta Aly Treating Provider/Extender: Tito Dine in Treatment: 1 Debridement Performed for Wound #2 Left,Plantar Toe Great Assessment: Performed By: Physician Ricard Dillon, MD Debridement Type: Debridement Severity of Tissue Pre Debridement: Fat layer exposed Level of Consciousness (Pre- Awake and Alert procedure): Pre-procedure Verification/Time Out Yes - 10:16 Taken: Start Time: 10:16 Pain Control: Lidocaine Total Area Debrided (L x W): 0.1 (cm) x 0.3 (cm) = 0.03 (cm) Tissue and other material debrided: Viable, Slough, Subcutaneous, Slough Level: Skin/Subcutaneous Tissue Debridement Description: Excisional Instrument: Curette Bleeding: Moderate Hemostasis Achieved: Pressure End Time: 10:16 Response to Treatment: Procedure was tolerated well Level of Consciousness (Post- Awake and Alert procedure): Post Debridement Measurements of Total Wound Length: (cm) 0.1 Width: (cm) 0.3 Depth: (cm) 0.5 Volume: (cm) 0.012 Character of Wound/Ulcer Post Debridement: Stable Severity of Tissue Post Debridement: Fat layer exposed Post Procedure Diagnosis Same as Pre-procedure Electronic Signature(s) Signed: 06/07/2019 5:07:28 PM By: Linton Ham MD Signed: 06/09/2019 5:21:44 PM By: Gretta Cool, BSN, RN, CWS, Kim RN, BSN Entered By: Linton Ham on 06/07/2019 10:25:08 [name] ([id number]) -------------------------------------------------------------------------------- HPI Details Patient Name: Debra [name], [name] Date of Service: 06/07/2019 10:00 AM Medical Record Number: [id number] Patient Account Number: [id number] Date of Birth/Sex: [birth year] (73 y.o. 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 She tells me she is had a wound on the plantar aspect of her left great toe since August. There was swelling and redness and she was diagnosed with cellulitis. For a period of time she was on 2 different antibiotics 1 for a UTI and 1 for the toe and things actually got better. As far as I'm aware she has not  had cultures or x-rays of the area. Although she states she has arterial disease the ABI in the right was 1.03 in our clinic on the left 1.08. I'm not certain how she is dressing this currently although she is certainly not offloading. She also has more recently developed a weeping wound on the left anterior leg and 2 small areas on the right anterior leg. She probably has chronic venous insufficiency with inflammation bilaterally. I suspect she also has development of a Charcot foot on the left 01/25/15; she still has a denuded blister on the right leg there is no open area on the left leg. On her left great toe plantar aspect the wound required a surgical debridement to remove skin nonviable subcutaneous tissue post debridement this actually cleans up quite nicely there is no overt infection here no evidence of soft tissue infection or ischemia 02/01/15; the patient arrives with poorly controlled edema bilateral legs. She has a open wound on the right leg. She has not been wearing the healing sagittals and therefore the left plantar toe wound is not better with surrounding callus Readmission: 09/07/18 on evaluation today patient actually presents for initial inspection our clinic concerning issues that she's been having with her left great toe along with the bilateral lower extremities. The lower Trinity ulcers appear to be a combination of Venus and diabetic and the  great toe ulcer is a diabetic neuropathy type ulcer. Unfortunately she's had these for quite some time in regard to the toe and the right lower extremity left lower Trinity ulcers she tells me have appeared in the past week. There's no sign of infection at any site which is good news. In regard to the toe ulcer I see no evidence of bone exposure which is good news. There was some question as to whether or not bone was exposed when she saw her primary care provider. The patient does have a history as well of hypertension. 09/14/18 on evaluation today patient's wound to lower extremity actually appear to be doing excellent. In fact the biggest thing I see is that the alginate kind of got stuck to the wound bed's which is the main issue here. Fortunately there's no signs of active infection which is good news. No fevers, chills, nausea, or vomiting noted at this time. 09/21/18 on evaluation today patient actually appears to be doing excellent in regard to her bilateral lower extremities both are healed. In regard to her left great toe ulcer this is not completely healed at this point in fact it really does show some signs of callous buildup as well as Slough in the service wound although it's minimal. No fevers, chills, nausea, or vomiting noted at this time. 09/28/18 on evaluation today patient actually appears to be doing okay in regard to her toe ulcer. Unfortunately she does have some issues with the wound. Somewhat dry but it sounds like she is not been putting the collagen on the wound bed. She has been just utilizing a Dry gauze dressing. She states that she "couldn't find the collagen". Nonetheless I think this is why the wound bed appears to be so dry today. Obviously we do not want it to wet macerated but we also do not want it to dry. 10/12/18 on evaluation today patient appears to be doing about the same in regard to her toe ulcer. She's been tolerating the dressing changes without complication.  With that being said she did not want to feed more in the past few days she  tells me and it does show in the evidence of her lower extremity swelling at this point. Fortunately there does not appear to be any signs of active infection at this time. With that being said she's been tolerating the dressing changes without complication. No fevers, chills, nausea, or vomiting noted at this time. The7/29/2020 on evaluation today patient actually appears to be doing quite well with regard to her toe ulcer. This is showing signs of improvement and in fact wound is measuring about 60% the size it was last evaluation. This is excellent news. Overall I am very pleased with how she seems to be progressing she does tell me she is taking more breaks and trying to stay off of it as much as possible. 10/26/2018 on evaluation today patient actually appears to be doing quite well with regard to her wounds on the wound on the left great toe. She has been tolerating the dressing changes without complication. Fortunately there is no signs of active infection at this time. Overall very pleased with the progress that is been made up to this point. With that being said she seems to be stalled out at this time and I think we may need to try something a little different in order to get this to completely heal. 11/02/2018 upon evaluation today patient actually appears to be doing well with regard to her toe ulcer from the standpoint of the overall appearance. With that being said she is having some issues currently with the left lower extremity where she has a small blister that opened in the past 24 hours that does need to be addressed today as well. Fortunately there is no sign of active infection at this time which is good news. 11/09/2018 on evaluation today patient actually appears to be doing a little better in regard to her toe ulcer. She has been tolerating the dressing changes without complication. Fortunately there is no  signs of active infection at this time. No fevers, chills, nausea, vomiting, or diarrhea. Overall I am actually rather pleased with how things seem to be progressing at this time. 11/16/2018 on evaluation today patient's toe ulcer appears to be doing quite well which is good news. Fortunately there is no signs of active infection Reeg, Cortasia L. ([id number]) which is also good news. No fevers, chills, nausea, vomiting, or diarrhea. As of this point we still have not heard anything back about the Dermagraft and whether or not this will be approved. 11/23/18 upon evaluation today patient actually appears to be doing quite well with regard to her toe ulcer all things considered. We still not gotten approval from Dermagraft as of yet. Obviously the sooner we can get this to better in my pinion nonetheless there's gonna be some need for sharp debridement today based on what I'm seeing. 11/30/2018 on evaluation today patient actually appears to be doing about the same with regard to her toe ulcer. Fortunately there is no severe evidence of callus buildup which is good news. With that being said I definitely think that she will do well with the Dermagraft which fortunately we have gotten approved at this time. There is no signs of infection so I think this is absolutely appropriate for [place] to attempt at this point. 12/07/2018 on evaluation today patient appears to be doing much better with regard to her toe ulcer at this time. Fortunately there is no signs of active infection at this point. She has been tolerating the dressing changes without complication. No fevers, chills, nausea,  vomiting, or diarrhea. I do believe the Dermagraft has been beneficial for her her wound seems to be measuring much smaller today compared to last week's evaluation this is excellent. Overall she continues to make good progress. 12/14/2018 on evaluation today patient appears to be doing well with regard to her toe ulcer. This is showing  signs of improvement which is good news. Overall I am very pleased With how things appear. There is no signs of active infection at this time. I do feel like the wound is appearing much more healthy based on what I see today. Today is Dermagraft application #3. ADMISSION 05/31/2019 The patient's previous records from our office in [place] are included above. The patient is here for a deep probing wound on the plantar left heel. 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 It would appear that this started sometime in December although the details are vague. She was in urgent care on 04/14/2019 with what was felt to be a wound infection in the left foot there was noted to be drainage and odor. She was put on doxycycline and x-ray at that time showed no osteomyelitis. She was back in the ER on 04/22/2019 with increasing swelling and redness on the plantar foot. There is a picture in epic to reflect this really looked quite impressive. She adamantly refused admission. She was given clindamycin again another x-ray was negative but noted soft tissue swelling. The patient was largely in [place] from June to September with a wound on the left plantar toe. Dermagraft was tried currently she was discharged on healed when the patient's insurance stopped paying for her to come there. She dropped out of the clinic at her own insistence. The patient is not on any current antibiotics. I am not clear how she is dressing this. The area on the left great toe has eschared over Past medical history; type 2 diabetes with neuropathy, hypertension, chronic left toe ulcer, history of small bowel obstruction secondary to hernia status post ventral hernia repair ABIs in our clinic were 1.12 on the left. The patient had formal arterial studies in October 2016 at that point her ABI was 1.2 TBI at 1.02 on the right triphasic waveforms. On the left ABI at 1.24 TBI was not done  probably because of bandaging triphasic waveforms 06/07/2019; the patient has a new wound this week on the plantar part of her left first toe. This had eschar on it last week I did not debride this. Culture of the area last week showed group B strep and a few Morganella morganii. The latter would not be covered by the empiric doxycycline I gave her. We use silver alginate and the heel wound which I think the patient is changing on her own We also have problems with the MRI. The patient arrived in the clinic saying her insurance that she already had an MRI and therefore would not pay for it. We looked through epic in [place] link. There is no MRI in either area. She had to negative plain x-rays for osteomyelitis which does not preclude considering an MRI in this diabetic woman with a wound that probes to bone Electronic Signature(s) Signed: 06/07/2019 5:07:28 PM By: Linton Ham MD Entered By: Linton Ham on 06/07/2019 10:28:43 [name] ([id number]) -------------------------------------------------------------------------------- Physical Exam Details Patient Name: Debra [name], [name] Date of Service: 06/07/2019 10:00 AM Medical Record Number: [id number] Patient Account Number: [id number] Date of Birth/Sex: 11-08-1945 (73 y.o. Female)  Treating RN: Cornell Barman Primary Care Provider: Vicenta Aly Other Clinician: Referring Provider: Vicenta Aly Treating Provider/Extender: Tito Dine in Treatment: 1 Constitutional Patient is hypertensive.. Pulse regular and within target range for patient.Marland Kitchen Respirations regular, non-labored and within target range.. Temperature is normal and within the target range for the patient.Marland Kitchen appears in no distress. Cardiovascular Pedal pulses are palpable on the left her foot is warm. Integumentary (Hair, Skin) There is no erythema around the wound and less tenderness than last week. Notes Wound exam; the patient still has the same  wound on the left heel. This is deep and probes on to bone or precariously close to it. I removed skin and subcutaneous tissue from the wound circumference. Hemostasis with direct pressure. There is still an odor here. oThe eschared area on the plantar part of her first toe apparently came off when she was walking in her house. Noted blood on the floor. Surfaces there is also debrided with a #5 curette. This is a more superficial wound Electronic Signature(s) Signed: 06/07/2019 5:07:28 PM By: Linton Ham MD Entered By: Linton Ham on 06/07/2019 10:31:24 [name] ([id number]) -------------------------------------------------------------------------------- Physician Orders Details Patient Name: Debra [name], [name] Date of Service: 06/07/2019 10:00 AM Medical Record Number: [id number] Patient Account Number: [id number] Date of Birth/Sex: 10-Feb-1946 (73 y.o. Female) Treating RN: Cornell Barman Primary Care Provider: Vicenta Aly Other Clinician: Referring Provider: Vicenta Aly Treating Provider/Extender: Tito Dine in Treatment: 1 Verbal / Phone Orders: No Diagnosis Coding Wound Cleansing Wound #1 Left,Plantar Foot o May shower with protection. - Do not get dressing wet. o No tub bath. Anesthetic (add to Medication List) Wound #1 Left,Plantar Foot o Topical Lidocaine 4% cream applied to wound bed prior to debridement (In Clinic Only). Primary Wound Dressing Wound #1 Left,Plantar Foot o Silver Alginate Wound #2 Left,Plantar Toe Great o Other: - Coverlet Secondary Dressing Wound #1 Left,Plantar Foot o ABD and Kerlix/Conform Dressing Change Frequency Wound #1 Left,Plantar Foot o Change Dressing Monday, Wednesday, Friday Follow-up Appointments Wound #1 Left,Plantar Foot o Return Appointment in 1 week. Wound #2 Left,Plantar Toe Great o Return Appointment in 1 week. Off-Loading Wound #1 Left,Plantar Foot o Heel suspension  boot Additional Orders / Instructions Wound #1 Left,Plantar Foot o Increase protein intake. Medications-please add to medication list. Wound #1 Left,Plantar Foot o P.O. Antibiotics - Cipro Wound #2 Left,Plantar Toe Great o P.O. Antibiotics - Cipro Electronic Signature(s) Signed: 06/07/2019 5:07:28 PM By: Linton Ham MD Signed: 06/09/2019 5:21:44 PM By: Gretta Cool BSN, RN, CWS, Kim RN, BSN [address] [place] ([id number]) Entered By: Gretta Cool, BSN, RN, CWS, Kim on 06/07/2019 10:24:07 [name] ([id number]) -------------------------------------------------------------------------------- Problem List Details Patient Name: Debra [name], Debra [name]. Date of Service: 06/07/2019 10:00 AM Medical Record Number: [id number] Patient Account Number: [id number] Date of Birth/Sex: February 07, 1946 (73 y.o. Female) Treating RN: Cornell Barman Primary Care Provider: Vicenta Aly Other Clinician: Referring Provider: Vicenta Aly Treating Provider/Extender: Tito Dine in Treatment: 1 Active Problems ICD-10 Evaluated Encounter Code Description Active Date Today Diagnosis E11.621 Type 2 diabetes mellitus with foot ulcer 05/31/2019 No Yes L97.523 Non-pressure chronic ulcer of other part of left foot with necrosis of muscle 05/31/2019 No Yes L97.522 Non-pressure chronic ulcer of other part of left foot with fat layer exposed 06/07/2019 No Yes E11.42 Type 2 diabetes mellitus with diabetic polyneuropathy 05/31/2019 No Yes M14.672 Charcot's joint, left ankle and foot 05/31/2019 No Yes Inactive Problems Resolved Problems Electronic Signature(s) Signed: 06/07/2019 5:07:28 PM By: Linton Ham MD Entered By:  Linton Ham on 06/07/2019 10:25:50 [name] ([id number]) -------------------------------------------------------------------------------- Progress Note Details Patient Name: NAVAE, FRANCZAK. Date of Service: 06/07/2019 10:00 AM Medical Record Number: [id number] Patient Account Number:  [id number] Date of Birth/Sex: [birth year] (73 y.o. 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 She tells me she is had a wound on the plantar aspect of her left great toe since August. There was swelling and redness and she was diagnosed with cellulitis. For a period of time she was on 2 different antibiotics 1 for a UTI and 1 for the toe and things actually got better. As far as I'm aware she has not had cultures or x-rays of the area. Although she states she has arterial disease the ABI in the right was 1.03 in our clinic on the left 1.08. I'm not certain how she is dressing this currently although she is certainly not offloading. She also has more recently developed a weeping wound on the left anterior leg and 2 small areas on the right anterior leg. She probably has chronic venous insufficiency with inflammation bilaterally. I suspect she also has development of a Charcot foot on the left 01/25/15; she still has a denuded blister on the right leg there is no open area on the left leg. On her left great toe plantar aspect the wound required a surgical debridement to remove skin nonviable subcutaneous tissue post debridement this actually cleans up quite nicely there is no overt infection here no evidence of soft tissue infection or ischemia 02/01/15; the patient arrives with poorly controlled edema bilateral legs. She has a open wound on the right leg. She has not been wearing the healing sagittals and therefore the left plantar toe wound is not better with surrounding callus Readmission: 09/07/18 on evaluation today patient actually presents for initial inspection our clinic concerning issues that she's  been having with her left great toe along with the bilateral lower extremities. The lower Trinity ulcers appear to be a combination of Venus and diabetic and the great toe ulcer is a diabetic neuropathy type ulcer. Unfortunately she's had these for quite some time in regard to the toe and the right lower extremity left lower Trinity ulcers she tells me have appeared in the past week. There's no sign of infection at any site which is good news. In regard to the toe ulcer I see no evidence of bone exposure which is good news. There was some question as to whether or not bone was exposed when she saw her primary care provider. The patient does have a history as well of hypertension. 09/14/18 on evaluation today patient's wound to lower extremity actually appear to be doing excellent. In fact the biggest thing I see is that the alginate kind of got stuck to the wound bed's which is the main issue here. Fortunately there's no signs of active infection which is good news. No fevers, chills, nausea, or vomiting noted at this time. 09/21/18 on evaluation today patient actually appears to be doing excellent in regard to her bilateral lower extremities both are healed. In regard to her left great toe ulcer this is not completely healed at this point in fact it really does show some signs of callous buildup as well as Slough in the service wound although  it's minimal. No fevers, chills, nausea, or vomiting noted at this time. 09/28/18 on evaluation today patient actually appears to be doing okay in regard to her toe ulcer. Unfortunately she does have some issues with the wound. Somewhat dry but it sounds like she is not been putting the collagen on the wound bed. She has been just utilizing a Dry gauze dressing. She states that she "couldn't find the collagen". Nonetheless I think this is why the wound bed appears to be so dry today. Obviously we do not want it to wet macerated but we also do not want it to  dry. 10/12/18 on evaluation today patient appears to be doing about the same in regard to her toe ulcer. She's been tolerating the dressing changes without complication. With that being said she did not want to feed more in the past few days she tells me and it does show in the evidence of her lower extremity swelling at this point. Fortunately there does not appear to be any signs of active infection at this time. With that being said she's been tolerating the dressing changes without complication. No fevers, chills, nausea, or vomiting noted at this time. The7/29/2020 on evaluation today patient actually appears to be doing quite well with regard to her toe ulcer. This is showing signs of improvement and in fact wound is measuring about 60% the size it was last evaluation. This is excellent news. Overall I am very pleased with how she seems to be progressing she does tell me she is taking more breaks and trying to stay off of it as much as possible. 10/26/2018 on evaluation today patient actually appears to be doing quite well with regard to her wounds on the wound on the left great toe. She has been tolerating the dressing changes without complication. Fortunately there is no signs of active infection at this time. Overall very pleased with the progress that is been made up to this point. With that being said she seems to be stalled out at this time and I think we may need to try something a little different in order to get this to completely heal. 11/02/2018 upon evaluation today patient actually appears to be doing well with regard to her toe ulcer from the standpoint of the overall appearance. With that being said she is having some issues currently with the left lower extremity where she has a small blister that opened in the past 24 hours that does need to be addressed today as well. Fortunately there is no sign of active infection at this time which is good news. 11/09/2018 on evaluation today  patient actually appears to be doing a little better in regard to her toe ulcer. She has been tolerating the dressing changes without complication. Fortunately there is no signs of active infection at this time. No fevers, chills, nausea, vomiting, or diarrhea. Overall I am actually rather pleased with how things seem to be progressing at this time. 11/16/2018 on evaluation today patient's toe ulcer appears to be doing quite well which is good news. Fortunately there is no signs of active infection Debra [name], [name] ([id number]) which is also good news. No fevers, chills, nausea, vomiting, or diarrhea. As of this point we still have not heard anything back about the Dermagraft and whether or not this will be approved. 11/23/18 upon evaluation today patient actually appears to be doing quite well with regard to her toe ulcer all things considered. We still not gotten approval from Dermagraft as  of yet. Obviously the sooner we can get this to better in my pinion nonetheless there's gonna be some need for sharp debridement today based on what I'm seeing. 11/30/2018 on evaluation today patient actually appears to be doing about the same with regard to her toe ulcer. Fortunately there is no severe evidence of callus buildup which is good news. With that being said I definitely think that she will do well with the Dermagraft which fortunately we have gotten approved at this time. There is no signs of infection so I think this is absolutely appropriate for [place] to attempt at this point. 12/07/2018 on evaluation today patient appears to be doing much better with regard to her toe ulcer at this time. Fortunately there is no signs of active infection at this point. She has been tolerating the dressing changes without complication. No fevers, chills, nausea, vomiting, or diarrhea. I do believe the Dermagraft has been beneficial for her her wound seems to be measuring much smaller today compared to last week's evaluation  this is excellent. Overall she continues to make good progress. 12/14/2018 on evaluation today patient appears to be doing well with regard to her toe ulcer. This is showing signs of improvement which is good news. Overall I am very pleased With how things appear. There is no signs of active infection at this time. I do feel like the wound is appearing much more healthy based on what I see today. Today is Dermagraft application #3. ADMISSION 05/31/2019 The patient's previous records from our office in [place] are included above. The patient is here for a deep probing wound on the plantar left heel. 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 It would appear that this started sometime in December although the details are vague. She was in urgent care on 04/14/2019 with what was felt to be a wound infection in the left foot there was noted to be drainage and odor. She was put on doxycycline and x-ray at that time showed no osteomyelitis. She was back in the ER on 04/22/2019 with increasing swelling and redness on the plantar foot. There is a picture in epic to reflect this really looked quite impressive. She adamantly refused admission. She was given clindamycin again another x-ray was negative but noted soft tissue swelling. The patient was largely in [place] from June to September with a wound on the left plantar toe. Dermagraft was tried currently she was discharged on healed when the patient's insurance stopped paying for her to come there. She dropped out of the clinic at her own insistence. The patient is not on any current antibiotics. I am not clear how she is dressing this. The area on the left great toe has eschared over Past medical history; type 2 diabetes with neuropathy, hypertension, chronic left toe ulcer, history of small bowel obstruction secondary to hernia status post ventral hernia repair ABIs in our clinic were 1.12 on the left. The  patient had formal arterial studies in October 2016 at that point her ABI was 1.2 TBI at 1.02 on the right triphasic waveforms. On the left ABI at 1.24 TBI was not done probably because of bandaging triphasic waveforms 06/07/2019; the patient has a new wound this week on the plantar part of her left first toe. This had eschar on it last week I did not debride this. Culture of the area last week showed group B strep and a few Morganella morganii. The latter would  not be covered by the empiric doxycycline I gave her. We use silver alginate and the heel wound which I think the patient is changing on her own We also have problems with the MRI. The patient arrived in the clinic saying her insurance that she already had an MRI and therefore would not pay for it. We looked through epic in [place] link. There is no MRI in either area. She had to negative plain x-rays for osteomyelitis which does not preclude considering an MRI in this diabetic woman with a wound that probes to bone Objective Constitutional Patient is hypertensive.. Pulse regular and within target range for patient.Marland Kitchen Respirations regular, non-labored and within target range.. Temperature is normal and within the target range for the patient.Marland Kitchen appears in no distress. Vitals Time Taken: 9:50 AM, Height: 64 in, Weight: 230 lbs, BMI: 39.5, Temperature: 98.5 F, Pulse: 85 bpm, Respiratory Rate: 18 breaths/min, Blood Pressure: 152/69 mmHg. Cardiovascular Pedal pulses are palpable on the left her foot is warm. General Notes: Wound exam; the patient still has the same wound on the left heel. This is deep and probes on to bone or precariously close to it. ADORIA, KLAUSS L. ([id number]) removed skin and subcutaneous tissue from the wound circumference. Hemostasis with direct pressure. There is still an odor here. The eschared area on the plantar part of her first toe apparently came off when she was walking in her house. Noted blood on the  floor. Surfaces there is also debrided with a #5 curette. This is a more superficial wound Integumentary (Hair, Skin) There is no erythema around the wound and less tenderness than last week. Wound #1 status is Open. Original cause of wound was Gradually Appeared. The wound is located on the [facility]. The wound measures 0.4cm length x 0.6cm width x 1.7cm depth; 0.188cm^2 area and 0.32cm^3 volume. There is Fat Layer (Subcutaneous Tissue) Exposed exposed. There is no undermining noted, however, there is tunneling at 6:00 with a maximum distance of 1.9cm. There is a medium amount of serosanguineous drainage noted. Foul odor after cleansing was noted. The wound margin is flat and intact. There is large (67-100%) pink granulation within the wound bed. There is a small (1-33%) amount of necrotic tissue within the wound bed including Adherent Slough. Wound #2 status is Open. Original cause of wound was Gradually Appeared. The wound is located on the [employer]. The wound measures 0.1cm length x 0.3cm width x 0.5cm depth; 0.024cm^2 area and 0.012cm^3 volume. There is Fat Layer (Subcutaneous Tissue) Exposed exposed. There is no tunneling or undermining noted. There is a medium amount of serosanguineous drainage noted. There is small (1-33%) pink granulation within the wound bed. There is a large (67-100%) amount of necrotic tissue within the wound bed including Adherent Slough. 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 There was a Excisional Skin/Subcutaneous Tissue Debridement with a total area of 0.24 sq cm performed by Ricard Dillon, MD. With the following instrument(s): Curette to remove Viable tissue/material. Material removed includes Subcutaneous Tissue and Slough and after achieving pain  control using Lidocaine. No specimens were taken. A time out was conducted at 10:16, prior to the start of the procedure. A Moderate amount of bleeding was controlled with Pressure. The procedure was tolerated well. Post Debridement Measurements: 0.4cm length x 0.6cm width x 1.7cm depth; 0.32cm^3 volume. Character of Wound/Ulcer Post Debridement is stable. Severity of Tissue Post Debridement is: Limited to breakdown of skin. Post procedure Diagnosis Wound #1: Same as Pre-Procedure Wound #2 Pre-procedure diagnosis of Wound #2 is a Diabetic Wound/Ulcer of the Lower Extremity located on the Left,Plantar Toe Great .Severity of Tissue Pre Debridement is: Fat layer exposed. There was a Excisional Skin/Subcutaneous Tissue Debridement with a total area of 0.03 sq cm performed by Ricard Dillon, MD. With the following instrument(s): Curette to remove Viable tissue/material. Material removed includes Subcutaneous Tissue and Slough and after achieving pain control using Lidocaine. No specimens were taken. A time out was conducted at 10:16, prior to the start of the procedure. A Moderate amount of bleeding was controlled with Pressure. The procedure was tolerated well. Post Debridement Measurements: 0.1cm length x 0.3cm width x 0.5cm depth; 0.012cm^3 volume. Character of Wound/Ulcer Post Debridement is stable. Severity of Tissue Post Debridement is: Fat layer exposed. Post procedure Diagnosis Wound #2: Same as Pre-Procedure Plan Wound Cleansing: Wound #1 Left,Plantar Foot: MKENNA, SAAR ([id number]) May shower with protection. - Do not get dressing wet. No tub bath. Anesthetic (add to Medication List): Wound #1  Left,Plantar Foot: Topical Lidocaine 4% cream applied to wound bed prior to debridement (In Clinic Only). Primary Wound Dressing: Wound #1 Left,Plantar Foot: Silver Alginate Wound #2 Left,Plantar Toe Great: Other: - Coverlet Secondary Dressing: Wound #1 Left,Plantar Foot: ABD and Kerlix/Conform Dressing Change Frequency: Wound #1 Left,Plantar Foot: Change Dressing Monday, Wednesday, Friday Follow-up Appointments: Wound #1 Left,Plantar Foot: Return Appointment in 1 week. Wound #2 Left,Plantar Toe Great: Return Appointment in 1 week. Off-Loading: Wound #1 Left,Plantar Foot: Heel suspension boot Additional Orders / Instructions: Wound #1 Left,Plantar Foot: Increase protein intake. Medications-please add to medication list.: Wound #1 Left,Plantar Foot: P.O. Antibiotics - Cipro Wound #2 Left,Plantar Toe Great: P.O. Antibiotics - Cipro 1. Cipro 500 twice daily to further cover the Morganella x7 days 2. She very well could have underlying osteomyelitis in the heel. She has had 2 - plain x-rays she definitely needs an MRI. 3. If she does not have osteomyelitis I had like to proceed with a total contact cast in this patient with diabetic neuropathy and a Charcot foot. If she does she will need a course of antibiotics probably IV 4. The whole discussion about the MRI was a little difficult to understand. As far as we are able to see she is not had an MRI in the [facility] system or anywhere in care everywhere that I was able to see. She has had 2 plain x-rays. 5. Continue with silver alginate as the primary dressing. I wonder about the patient's ability to dress this herself change q. OD 6. Heel offloading boot to continue. Electronic Signature(s) Signed: 06/07/2019 5:07:28 PM By: Linton Ham MD Entered By: Linton Ham on 06/07/2019 10:38:01 [name] ([id number]) -------------------------------------------------------------------------------- [facility] Details Patient Name:  Debra [name], [name] Date of Service: 06/07/2019 Medical Record Number: [id number] Patient Account Number: [id number] Date of Birth/Sex: 1946/01/29 (74 y.o. Female) Treating RN: Cornell Barman Primary Care Provider: Vicenta Aly Other Clinician: Referring Provider: Vicenta Aly Treating Provider/Extender: Ricard Dillon Weeks in Treatment: 1 Diagnosis Coding ICD-10 Codes Code Description E11.621 Type 2 diabetes mellitus with  foot ulcer L97.523 Non-pressure chronic ulcer of other part of left foot with necrosis of muscle L97.522 Non-pressure chronic ulcer of other part of left foot with fat layer exposed E11.42 Type 2 diabetes mellitus with diabetic polyneuropathy M14.672 Charcot's joint, left ankle and foot Facility Procedures CPT4 Code: [id number] Description: [id number] - DEB SUBQ TISSUE 20 SQ CM/< Modifier: Quantity: 1 CPT4 Code: Description: ICD-10 Diagnosis Description L97.523 Non-pressure chronic ulcer of other part of left foot with necrosis of musc L97.522 Non-pressure chronic ulcer of other part of left foot with fat layer expose Modifier: le d Quantity: Physician Procedures CPT4 [id number] Description: [id number] - WC PHYS SUBQ TISS 20 SQ CM Modifier: Quantity: 1 CPT4 Code: Description: ICD-10 Diagnosis Description L97.523 Non-pressure chronic ulcer of other part of left foot with necrosis of musc L97.522 Non-pressure chronic ulcer of other part of left foot with fat layer expose Modifier: le d Quantity: Electronic Signature(s) Signed: 06/07/2019 5:07:28 PM By: Linton Ham MD Entered By: Linton Ham on 06/07/2019 10:38:21

## 2019-06-14 ENCOUNTER — Encounter: Payer: Medicare Other | Admitting: Internal Medicine

## 2019-06-14 ENCOUNTER — Other Ambulatory Visit: Payer: Self-pay

## 2019-06-14 DIAGNOSIS — E11621 Type 2 diabetes mellitus with foot ulcer: Secondary | ICD-10-CM | POA: Diagnosis not present

## 2019-06-15 ENCOUNTER — Other Ambulatory Visit (HOSPITAL_COMMUNITY): Payer: Medicare Other

## 2019-06-15 ENCOUNTER — Encounter (HOSPITAL_COMMUNITY): Payer: Self-pay

## 2019-06-19 ENCOUNTER — Other Ambulatory Visit (HOSPITAL_COMMUNITY): Payer: Self-pay | Admitting: Internal Medicine

## 2019-06-19 ENCOUNTER — Ambulatory Visit (HOSPITAL_COMMUNITY)
Admission: RE | Admit: 2019-06-19 | Discharge: 2019-06-19 | Disposition: A | Discharge status: Home / Self Care | Payer: Medicare Other | Source: Ambulatory Visit | Attending: Internal Medicine | Admitting: Internal Medicine

## 2019-06-19 ENCOUNTER — Other Ambulatory Visit: Payer: Self-pay

## 2019-06-19 DIAGNOSIS — T148XXA Other injury of unspecified body region, initial encounter: Secondary | ICD-10-CM | POA: Insufficient documentation

## 2019-06-19 LAB — POCT I-STAT CREATININE: Creatinine, Ser: 1.1 mg/dL — ABNORMAL HIGH (ref 0.44–1.00)

## 2019-06-19 MED ORDER — GADOBUTROL 1 MMOL/ML IV SOLN: 10.0000 mL | Freq: Once | INTRAVENOUS | Status: DC | PRN

## 2019-06-23 ENCOUNTER — Encounter: Payer: Medicare Other | Attending: Physician Assistant | Admitting: Physician Assistant

## 2019-06-23 ENCOUNTER — Other Ambulatory Visit: Payer: Self-pay

## 2019-06-23 DIAGNOSIS — E11621 Type 2 diabetes mellitus with foot ulcer: Secondary | ICD-10-CM | POA: Diagnosis present

## 2019-06-23 DIAGNOSIS — Z880 Allergy status to penicillin: Secondary | ICD-10-CM | POA: Insufficient documentation

## 2019-06-23 DIAGNOSIS — I890 Lymphedema, not elsewhere classified: Secondary | ICD-10-CM | POA: Insufficient documentation

## 2019-06-23 DIAGNOSIS — Z881 Allergy status to other antibiotic agents status: Secondary | ICD-10-CM | POA: Diagnosis not present

## 2019-06-23 DIAGNOSIS — L97523 Non-pressure chronic ulcer of other part of left foot with necrosis of muscle: Secondary | ICD-10-CM | POA: Diagnosis not present

## 2019-06-23 DIAGNOSIS — I10 Essential (primary) hypertension: Secondary | ICD-10-CM | POA: Diagnosis not present

## 2019-06-23 DIAGNOSIS — E1142 Type 2 diabetes mellitus with diabetic polyneuropathy: Secondary | ICD-10-CM | POA: Diagnosis not present

## 2019-06-23 DIAGNOSIS — Z886 Allergy status to analgesic agent status: Secondary | ICD-10-CM | POA: Diagnosis not present

## 2019-06-23 DIAGNOSIS — E1151 Type 2 diabetes mellitus with diabetic peripheral angiopathy without gangrene: Secondary | ICD-10-CM | POA: Insufficient documentation

## 2019-06-23 DIAGNOSIS — M86472 Chronic osteomyelitis with draining sinus, left ankle and foot: Secondary | ICD-10-CM | POA: Diagnosis not present

## 2019-06-23 DIAGNOSIS — Z885 Allergy status to narcotic agent status: Secondary | ICD-10-CM | POA: Insufficient documentation

## 2019-06-23 DIAGNOSIS — M1990 Unspecified osteoarthritis, unspecified site: Secondary | ICD-10-CM | POA: Diagnosis not present

## 2019-06-23 DIAGNOSIS — Z888 Allergy status to other drugs, medicaments and biological substances status: Secondary | ICD-10-CM | POA: Insufficient documentation

## 2019-06-23 DIAGNOSIS — L02612 Cutaneous abscess of left foot: Secondary | ICD-10-CM | POA: Insufficient documentation

## 2019-06-23 NOTE — Progress Notes (Addendum)
LOUIS, LICONA ([id number]) Visit Report for 06/23/2019 Chief Complaint Document Details Patient Name: Debra [name], Debra [name]. Date of Service: 06/23/2019 10:45 AM Medical Record Number: [id number] Patient Account Number: [id number] Date of Birth/Sex: [birth year] (73 y.o. F) Treating RN: Montey Hora Primary Care Provider: Vicenta Aly Other Clinician: Referring Provider: Vicenta Aly Treating Provider/Extender: Melburn Hake, Briscoe Daniello Weeks in Treatment: 3 Information Obtained from: Patient Chief Complaint 05/31/2019; patient is here for review of a deep probing wound on the plantar heel Electronic Signature(s) Signed: 06/23/2019 10:51:26 AM By: Worthy Keeler PA-C Entered By: Worthy Keeler on 06/23/2019 10:51:26 [name] ([id number]) -------------------------------------------------------------------------------- Debridement Details Patient Name: Debra, [name] Date of Service: 06/23/2019 10:45 AM Medical Record Number: [id number] Patient Account Number: [id number] Date of Birth/Sex: 1945/07/26 (73 y.o. F) Treating RN: Cornell Barman Primary Care Provider: Vicenta Aly Other Clinician: Referring Provider: Vicenta Aly Treating Provider/Extender: Melburn Hake, Lynsi Dooner Weeks in Treatment: 3 Debridement Performed for Wound #1 Left,Plantar Foot Assessment: Performed By: Physician STONE III, Keandrea Tapley E., PA-C Debridement Type: Debridement Severity of Tissue Pre Debridement: Muscle involvement without necrosis Level of Consciousness (Pre- Awake and Alert procedure): Pre-procedure Verification/Time Out Yes - 11:35 Taken: Start Time: 11:35 Pain Control: Lidocaine Total Area Debrided (L x W): 1.1 (cm) x 1 (cm) = 1.1 (cm) Tissue and other material debrided: Viable, Non-Viable, Callus, Slough, Subcutaneous, Slough Level: Skin/Subcutaneous Tissue Debridement Description: Excisional Instrument: Curette Bleeding: Minimum Hemostasis Achieved: Pressure End Time: 11:38 Response to  Treatment: Procedure was tolerated well Level of Consciousness (Post- Awake and Alert procedure): Post Debridement Measurements of Total Wound Length: (cm) 1.1 Width: (cm) 1 Depth: (cm) 0.8 Volume: (cm) 0.691 Character of Wound/Ulcer Post Debridement: Stable Severity of Tissue Post Debridement: Muscle involvement without necrosis Post Procedure Diagnosis Same as Pre-procedure Electronic Signature(s) Signed: 06/26/2019 9:03:29 AM By: Worthy Keeler PA-C Signed: 06/26/2019 11:52:59 AM By: Gretta Cool, BSN, RN, CWS, Kim RN, BSN Entered By: Gretta Cool, BSN, RN, CWS, Kim on 06/23/2019 11:38:12 [name] ([id number]) -------------------------------------------------------------------------------- Debridement Details Patient Name: [name], [name] Date of Service: 06/23/2019 10:45 AM Medical Record Number: [id number] Patient Account Number: [id number] Date of Birth/Sex: 11-17-1945 (73 y.o. F) Treating RN: Cornell Barman Primary Care Provider: Vicenta Aly Other Clinician: Referring Provider: Vicenta Aly Treating Provider/Extender: Melburn Hake, Jamaul Heist Weeks in Treatment: 3 Debridement Performed for Wound #2 Left,Plantar Toe Great Assessment: Performed By: Physician STONE III, Livian Vanderbeck E., PA-C Debridement Type: Debridement Severity of Tissue Pre Debridement: Fat layer exposed Level of Consciousness (Pre- Awake and Alert procedure): Pre-procedure Verification/Time Out Yes - 11:35 Taken: Start Time: 11:35 Pain Control: Lidocaine Total Area Debrided (L x W): 0.2 (cm) x 0.2 (cm) = 0.04 (cm) Tissue and other material debrided: Viable, Non-Viable, Callus, Subcutaneous Level: Skin/Subcutaneous Tissue Debridement Description: Excisional Instrument: Curette Bleeding: Minimum Hemostasis Achieved: Pressure End Time: 11:38 Response to Treatment: Procedure was tolerated well Level of Consciousness (Post- Awake and Alert procedure): Post Debridement Measurements of Total Wound Length: (cm)  0.2 Width: (cm) 0.2 Depth: (cm) 0.2 Volume: (cm) 0.006 Character of Wound/Ulcer Post Debridement: Stable Severity of Tissue Post Debridement: Fat layer exposed Post Procedure Diagnosis Same as Pre-procedure Electronic Signature(s) Signed: 06/26/2019 9:03:29 AM By: Worthy Keeler PA-C Signed: 06/26/2019 11:52:59 AM By: Gretta Cool, BSN, RN, CWS, Kim RN, BSN Entered By: Gretta Cool, BSN, RN, CWS, Kim on 06/23/2019 11:41:44 [name] ([id number]) -------------------------------------------------------------------------------- HPI Details Patient Name: [name], [name] Date of Service: 06/23/2019 10:45 AM Medical Record Number: [id number] Patient Account Number: [id number] Date of Birth/Sex: [birth year] (73  y.o. 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 She tells me she is had a wound on the plantar aspect of her left great toe since August. There was swelling and redness and she was diagnosed with cellulitis. For a period of time she was on 2 different antibiotics 1 for a UTI and 1 for the toe and things actually got better. As far as I'm aware she has not had cultures or x-rays of the area. Although she states she has arterial disease the ABI in the right was 1.03 in our clinic on the left 1.08. I'm not certain how she is dressing this currently although she is certainly not offloading. She also has more recently developed a weeping wound on the left anterior leg and 2 small areas on the right anterior leg. She probably has chronic venous insufficiency with inflammation bilaterally. I suspect she also has development of a Charcot foot on the left 01/25/15; she still has a denuded blister on the right leg there  is no open area on the left leg. On her left great toe plantar aspect the wound required a surgical debridement to remove skin nonviable subcutaneous tissue post debridement this actually cleans up quite nicely there is no overt infection here no evidence of soft tissue infection or ischemia 02/01/15; the patient arrives with poorly controlled edema bilateral legs. She has a open wound on the right leg. She has not been wearing the healing sagittals and therefore the left plantar toe wound is not better with surrounding callus Readmission: 09/07/18 on evaluation today patient actually presents for initial inspection our clinic concerning issues that she's been having with her left great toe along with the bilateral lower extremities. The lower Trinity ulcers appear to be a combination of Venus and diabetic and the great toe ulcer is a diabetic neuropathy type ulcer. Unfortunately she's had these for quite some time in regard to the toe and the right lower extremity left lower Trinity ulcers she tells me have appeared in the past week. There's no sign of infection at any site which is good news. In regard to the toe ulcer I see no evidence of bone exposure which is good news. There was some question as to whether or not bone was exposed when she saw her primary care provider. The patient does have a history as well of hypertension. 09/14/18 on evaluation today patient's wound to lower extremity actually appear to be doing excellent. In fact the biggest thing I see is that the alginate kind of got stuck to the wound bed's which is the main issue here. Fortunately there's no signs of active infection which is good news. No fevers, chills, nausea, or vomiting noted at this time. 09/21/18 on evaluation today patient actually appears to be doing excellent in regard to her bilateral lower extremities both are healed. In regard to her left great toe ulcer this is not completely healed at this point in fact it  really does show some signs of callous buildup as well as Slough in the service wound although it's minimal. No fevers, chills, nausea, or vomiting noted at this time. 09/28/18 on evaluation today patient actually appears to be doing okay in regard to her toe ulcer. Unfortunately she does have some issues with the  wound. Somewhat dry but it sounds like she is not been putting the collagen on the wound bed. She has been just utilizing a Dry gauze dressing. She states that she "couldn't find the collagen". Nonetheless I think this is why the wound bed appears to be so dry today. Obviously we do not want it to wet macerated but we also do not want it to dry. 10/12/18 on evaluation today patient appears to be doing about the same in regard to her toe ulcer. She's been tolerating the dressing changes without complication. With that being said she did not want to feed more in the past few days she tells me and it does show in the evidence of her lower extremity swelling at this point. Fortunately there does not appear to be any signs of active infection at this time. With that being said she's been tolerating the dressing changes without complication. No fevers, chills, nausea, or vomiting noted at this time. The7/29/2020 on evaluation today patient actually appears to be doing quite well with regard to her toe ulcer. This is showing signs of improvement and in fact wound is measuring about 60% the size it was last evaluation. This is excellent news. Overall I am very pleased with how she seems to be progressing she does tell me she is taking more breaks and trying to stay off of it as much as possible. 10/26/2018 on evaluation today patient actually appears to be doing quite well with regard to her wounds on the wound on the left great toe. She has been tolerating the dressing changes without complication. Fortunately there is no signs of active infection at this time. Overall very pleased with the progress  that is been made up to this point. With that being said she seems to be stalled out at this time and I think we may need to try something a little different in order to get this to completely heal. 11/02/2018 upon evaluation today patient actually appears to be doing well with regard to her toe ulcer from the standpoint of the overall appearance. With that being said she is having some issues currently with the left lower extremity where she has a small blister that opened in the past 24 hours that does need to be addressed today as well. Fortunately there is no sign of active infection at this time which is good news. 11/09/2018 on evaluation today patient actually appears to be doing a little better in regard to her toe ulcer. She has been tolerating the dressing changes without complication. Fortunately there is no signs of active infection at this time. No fevers, chills, nausea, vomiting, or diarrhea. Overall I am actually rather pleased with how things seem to be progressing at this time. 11/16/2018 on evaluation today patient's toe ulcer appears to be doing quite well which is good news. Fortunately there is no signs of active infection Olmo, Janiyah L. ([id number]) which is also good news. No fevers, chills, nausea, vomiting, or diarrhea. As of this point we still have not heard anything back about the Dermagraft and whether or not this will be approved. 11/23/18 upon evaluation today patient actually appears to be doing quite well with regard to her toe ulcer all things considered. We still not gotten approval from Dermagraft as of yet. Obviously the sooner we can get this to better in my pinion nonetheless there's gonna be some need for sharp debridement today based on what I'm seeing. 11/30/2018 on evaluation today patient actually appears to be  doing about the same with regard to her toe ulcer. Fortunately there is no severe evidence of callus buildup which is good news. With that being said I  definitely think that she will do well with the Dermagraft which fortunately we have gotten approved at this time. There is no signs of infection so I think this is absolutely appropriate for [place] to attempt at this point. 12/07/2018 on evaluation today patient appears to be doing much better with regard to her toe ulcer at this time. Fortunately there is no signs of active infection at this point. She has been tolerating the dressing changes without complication. No fevers, chills, nausea, vomiting, or diarrhea. I do believe the Dermagraft has been beneficial for her her wound seems to be measuring much smaller today compared to last week's evaluation this is excellent. Overall she continues to make good progress. 12/14/2018 on evaluation today patient appears to be doing well with regard to her toe ulcer. This is showing signs of improvement which is good news. Overall I am very pleased With how things appear. There is no signs of active infection at this time. I do feel like the wound is appearing much more healthy based on what I see today. Today is Dermagraft application #3. ADMISSION 05/31/2019 The patient's previous records from our office in [place] are included above. The patient is here for a deep probing wound on the plantar left heel. 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 It would appear that this started sometime in December although the details are vague. She was in urgent care on 04/14/2019 with what was felt to be a wound infection in the left foot there was noted to be drainage and odor. She was put on doxycycline and x-ray at that time showed no osteomyelitis. She was back in the ER on 04/22/2019 with increasing swelling and redness on the plantar foot. There is a picture in epic to reflect this really looked quite impressive. She adamantly refused admission. She was given clindamycin again another x-ray was negative but noted soft tissue  swelling. The patient was largely in [place] from June to September with a wound on the left plantar toe. Dermagraft was tried currently she was discharged on healed when the patient's insurance stopped paying for her to come there. She dropped out of the clinic at her own insistence. The patient is not on any current antibiotics. I am not clear how she is dressing this. The area on the left great toe has eschared over Past medical history; type 2 diabetes with neuropathy, hypertension, chronic left toe ulcer, history of small bowel obstruction secondary to hernia status post ventral hernia repair ABIs in our clinic were 1.12 on the left. The patient had formal arterial studies in October 2016 at that point her ABI was 1.2 TBI at 1.02 on the right triphasic waveforms. On the left ABI at 1.24 TBI was not done probably because of bandaging triphasic waveforms 06/07/2019; the patient has a new wound this week on the plantar part of her left first toe. This had eschar on it last week I did not debride this. Culture of the area last week showed group B strep and a few Morganella morganii. The latter would not be covered by the empiric doxycycline I gave her. We use silver alginate and the heel wound which I think the patient is changing on her own We also have problems with the MRI. The patient arrived  in the clinic saying her insurance that she already had an MRI and therefore would not pay for it. We looked through epic in [employer] link. There is no MRI in either area. She had to negative plain x-rays for osteomyelitis which does not preclude considering an MRI in this diabetic woman with a wound that probes to bone 3/24; patient still has a deep probing area on the plantar aspect of her heel. Her MRI is on 06/19/2019 I would be surprised if this did not show osteomyelitis. He also has an area on her plantar toe that opened again last week. We have been using silver alginate to both wound areas. She  is completing the ciprofloxacin that I gave her. This was to cover the Morganella on her culture listed on 3/17. It also came out today that the patient has not changed her dressing since last Saturday which might account for some of the odor and the maceration around the wound. She is managing her own dressings at home and I would be surprised if she was exactly doing this properly even applying the dressing level on the frequency. We may need to spend a little more time with her next visit 06/23/2019 upon evaluation today patient presents for reevaluation regarding her heel ulcer. We actually did have her MRI which was reviewed today during her evaluation. Subsequently it does appear that the patient may have some slight irregularity of the heel which included marrow edema of the plantar calcaneus towards the lateral side and this is likely a small focus of osteomyelitis early. In light of the fact that the patient has an ulcer here that probes all the way down to the bone area pretty much in the location described. There was also the obvious wound noted of her heel. There did not appear to be any finding consistent with osteomyelitis of the toe where she has a longstanding ulcer at this site as well. I have not seen this patient upon readmission here to [facility] as I previously saw her mainly in [place] but the toe appears to be doing much better to me today compared to where it used to be. Overall her heel however it is the more significant issue here. Electronic Signature(s) Signed: 06/26/2019 9:01:17 AM By: Worthy Keeler PA-C Entered By: Worthy Keeler on 06/26/2019 09:01:16 [name] ([id number]) -------------------------------------------------------------------------------- Physical Exam Details Patient Name: GERALDIN, DZIALO L. Date of Service: 06/23/2019 10:45 AM Medical Record Number: [id number] Patient Account Number: [id number] Date of Birth/Sex: 11/05/1945 (73 y.o. F) Treating  RN: Montey Hora Primary Care Provider: Vicenta Aly Other Clinician: Referring Provider: Vicenta Aly Treating Provider/Extender: Melburn Hake, Ladana Chavero Weeks in Treatment: 3 Constitutional Well-nourished and well-hydrated in no acute distress. Respiratory normal breathing without difficulty. Psychiatric this patient is able to make decisions and demonstrates good insight into disease process. Alert and Oriented x 3. pleasant and cooperative. 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 Fortunately she is not having any significant pain though I believe this is more due to neuropathy. Electronic Signature(s) Signed: 06/26/2019 9:01:36 AM By: Worthy Keeler PA-C Entered By: Worthy Keeler on 06/26/2019 09:01:36 [name] ([id number]) -------------------------------------------------------------------------------- Physician Orders Details Patient  Name: JAMEESHA, TRAUGHBER. Date of Service: 06/23/2019 10:45 AM Medical Record Number: [id number] Patient Account Number: [id number] Date of Birth/Sex: [birth year] (73 y.o. F) Treating RN: Cornell Barman Primary Care Provider: Vicenta Aly Other Clinician: Referring Provider: Vicenta Aly Treating Provider/Extender: Melburn Hake, Risa Auman Weeks in Treatment: 3 Verbal / Phone Orders: No Diagnosis Coding ICD-10 Coding Code Description E11.621 Type 2 diabetes mellitus with foot ulcer L97.523 Non-pressure chronic ulcer of other part of left foot with necrosis of muscle L97.522 Non-pressure chronic ulcer of other part of left foot with fat layer exposed E11.42 Type 2 diabetes mellitus with diabetic polyneuropathy M14.672 Charcot's joint, left  ankle and foot Wound Cleansing Wound #1 Left,Plantar Foot o May shower with protection. - Do not get dressing wet. o No tub bath. Wound #2 Left,Plantar Toe Great o May shower with protection. - Do not get dressing wet. o No tub bath. Anesthetic (add to Medication List) Wound #1 Left,Plantar Foot o Topical Lidocaine 4% cream applied to wound bed prior to debridement (In Clinic Only). Wound #2 Left,Plantar Toe Great o Topical Lidocaine 4% cream applied to wound bed prior to debridement (In Clinic Only). Primary Wound Dressing Wound #1 Left,Plantar Foot o Silver Alginate - packed lightly into wound Wound #2 Left,Plantar Toe Great o Silver Alginate o Other: - Coverlet Secondary Dressing Wound #1 Left,Plantar Foot o ABD and Kerlix/Conform Dressing Change Frequency Wound #1 Left,Plantar Foot o Change Dressing Monday, Wednesday, Friday - Or more often if wet. Wound #2 Left,Plantar Toe Great o Change Dressing Monday, Wednesday, Friday Follow-up Appointments Wound #1 [place] o Return Appointment in 1 week. Wound #2 Left,Plantar Toe Great o Return Appointment in 1 week. MACKINSEY, DANZY ([id number]) Off-Loading Wound #1 Left,Plantar Foot o Heel suspension boot Wound #2 Left,Plantar Toe Great o Heel suspension boot Additional Orders / Instructions Wound #1 Left,Plantar Foot o Increase protein intake. Wound #2 Left,Plantar Toe Great o Increase protein intake. Medications-please add to medication list. Wound #1 Left,Plantar Foot o P.O. Antibiotics - Cipro Consults o Infectious Disease - [place] of ID Patient Medications Allergies: penicillin, latex, aspirin, banana, hydrocodone, Westcort (neomycin-polymx-HC), tramadol, Voltaren Notifications Medication Indication Start End Cipro 06/23/2019 DOSE 1 - oral 500 mg tablet - 1 tablet oral taken 2 times per day for 14 days Electronic Signature(s) Signed: 06/23/2019 11:47:09 AM By:  Worthy Keeler PA-C Entered By: Worthy Keeler on 06/23/2019 11:47:09 [name] ([id number]) -------------------------------------------------------------------------------- Problem List Details Patient Name: CATELAYA, CHOKSI L. Date of Service: 06/23/2019 10:45 AM Medical Record Number: [id number] Patient Account Number: [id number] Date of Birth/Sex: [birth year] (73 y.o. F) Treating RN: Montey Hora Primary Care Provider: Vicenta Aly Other Clinician: Referring Provider: Vicenta Aly Treating Provider/Extender: Melburn Hake, Nevaeh Casillas Weeks in Treatment: 3 Active Problems ICD-10 Evaluated Encounter Code Description Active Date Today Diagnosis E11.621 Type 2 diabetes mellitus with foot ulcer 05/31/2019 No Yes L97.523 Non-pressure chronic ulcer of other part of left foot with necrosis of muscle 05/31/2019 No Yes L97.522 Non-pressure chronic ulcer of other part of left foot with fat layer exposed 06/07/2019 No Yes E11.42 Type 2 diabetes mellitus with diabetic polyneuropathy 05/31/2019 No Yes M14.672 Charcot's joint, left ankle and foot 05/31/2019 No Yes Inactive Problems Resolved Problems Electronic Signature(s) Signed: 06/23/2019 10:51:20 AM By: Worthy Keeler PA-C Entered By: Worthy Keeler on 06/23/2019 10:51:20 [name] ([id number]) -------------------------------------------------------------------------------- Progress Note Details Patient Name: [name] Date of Service: 06/23/2019 10:45 AM Medical Record Number: [id number] Patient Account Number: [id number] Date of Birth/Sex: 1946/03/19 (  74 y.o. F) Treating RN: Montey Hora Primary Care Provider: Vicenta Aly Other Clinician: Referring Provider: Vicenta Aly Treating Provider/Extender: Melburn Hake, Kerrie Timm Weeks in Treatment: 3 Subjective Chief Complaint Information obtained from Patient 05/31/2019; patient is here for review of a deep probing wound on the plantar heel History of Present Illness  (HPI) 01/18/15:this is a patient who is a type II diabetic with neuropathy and peripheral arterial disease. She tells me she is had a wound on the plantar aspect of her left great toe since August. There was swelling and redness and she was diagnosed with cellulitis. For a period of time she was on 2 different antibiotics 1 for a UTI and 1 for the toe and things actually got better. As far as I'm aware she has not had cultures or x-rays of the area. Although she states she has arterial disease the ABI in the right was 1.03 in our clinic on the left 1.08. I'm not certain how she is dressing this currently although she is certainly not offloading. She also has more recently developed a weeping wound on the left anterior leg and 2 small areas on the right anterior leg. She probably has chronic venous insufficiency with inflammation bilaterally. I suspect she also has development of a Charcot foot on the left 01/25/15; she still has a denuded blister on the right leg there is no open area on the left leg. On her left great toe plantar aspect the wound required a surgical debridement to remove skin nonviable subcutaneous tissue post debridement this actually cleans up quite nicely there is no overt infection here no evidence of soft tissue infection or ischemia 02/01/15; the patient arrives with poorly controlled edema bilateral legs. She has a open wound on the right leg. She has not been wearing the healing sagittals and therefore the left plantar toe wound is not better with surrounding callus Readmission: 09/07/18 on evaluation today patient actually presents for initial inspection our clinic concerning issues that she's been having with her left great toe along with the bilateral lower extremities. The lower Trinity ulcers appear to be a combination of Venus and diabetic and the great toe ulcer is a diabetic neuropathy type ulcer. Unfortunately she's had these for quite some time in regard to the toe  and the right lower extremity left lower Trinity ulcers she tells me have appeared in the past week. There's no sign of infection at any site which is good news. In regard to the toe ulcer I see no evidence of bone exposure which is good news. There was some question as to whether or not bone was exposed when she saw her primary care provider. The patient does have a history as well of hypertension. 09/14/18 on evaluation today patient's wound to lower extremity actually appear to be doing excellent. In fact the biggest thing I see is that the alginate kind of got stuck to the wound bed's which is the main issue here. Fortunately there's no signs of active infection which is good news. No fevers, chills, nausea, or vomiting noted at this time. 09/21/18 on evaluation today patient actually appears to be doing excellent in regard to her bilateral lower extremities both are healed. In regard to her left great toe ulcer this is not completely healed at this point in fact it really does show some signs of callous buildup as well as Slough in the service wound although it's minimal. No fevers, chills, nausea, or vomiting noted at this time. 09/28/18 on evaluation  today patient actually appears to be doing okay in regard to her toe ulcer. Unfortunately she does have some issues with the wound. Somewhat dry but it sounds like she is not been putting the collagen on the wound bed. She has been just utilizing a Dry gauze dressing. She states that she "couldn't find the collagen". Nonetheless I think this is why the wound bed appears to be so dry today. Obviously we do not want it to wet macerated but we also do not want it to dry. 10/12/18 on evaluation today patient appears to be doing about the same in regard to her toe ulcer. She's been tolerating the dressing changes without complication. With that being said she did not want to feed more in the past few days she tells me and it does show in the evidence of her  lower extremity swelling at this point. Fortunately there does not appear to be any signs of active infection at this time. With that being said she's been tolerating the dressing changes without complication. No fevers, chills, nausea, or vomiting noted at this time. The7/29/2020 on evaluation today patient actually appears to be doing quite well with regard to her toe ulcer. This is showing signs of improvement and in fact wound is measuring about 60% the size it was last evaluation. This is excellent news. Overall I am very pleased with how she seems to be progressing she does tell me she is taking more breaks and trying to stay off of it as much as possible. 10/26/2018 on evaluation today patient actually appears to be doing quite well with regard to her wounds on the wound on the left great toe. She has been tolerating the dressing changes without complication. Fortunately there is no signs of active infection at this time. Overall very pleased with the progress that is been made up to this point. With that being said she seems to be stalled out at this time and I think we may need to try something a little different in order to get this to completely heal. 11/02/2018 upon evaluation today patient actually appears to be doing well with regard to her toe ulcer from the standpoint of the overall appearance. With that being said she is having some issues currently with the left lower extremity where she has a small blister that opened in the past 24 hours that does need to be addressed today as well. Fortunately there is no sign of active infection at this time which is good news. SHYKIA, KILGO ([id number]) 11/09/2018 on evaluation today patient actually appears to be doing a little better in regard to her toe ulcer. She has been tolerating the dressing changes without complication. Fortunately there is no signs of active infection at this time. No fevers, chills, nausea, vomiting, or diarrhea.  Overall I am actually rather pleased with how things seem to be progressing at this time. 11/16/2018 on evaluation today patient's toe ulcer appears to be doing quite well which is good news. Fortunately there is no signs of active infection which is also good news. No fevers, chills, nausea, vomiting, or diarrhea. As of this point we still have not heard anything back about the Dermagraft and whether or not this will be approved. 11/23/18 upon evaluation today patient actually appears to be doing quite well with regard to her toe ulcer all things considered. We still not gotten approval from Dermagraft as of yet. Obviously the sooner we can get this to better in my pinion nonetheless  there's gonna be some need for sharp debridement today based on what I'm seeing. 11/30/2018 on evaluation today patient actually appears to be doing about the same with regard to her toe ulcer. Fortunately there is no severe evidence of callus buildup which is good news. With that being said I definitely think that she will do well with the Dermagraft which fortunately we have gotten approved at this time. There is no signs of infection so I think this is absolutely appropriate for [place] to attempt at this point. 12/07/2018 on evaluation today patient appears to be doing much better with regard to her toe ulcer at this time. Fortunately there is no signs of active infection at this point. She has been tolerating the dressing changes without complication. No fevers, chills, nausea, vomiting, or diarrhea. I do believe the Dermagraft has been beneficial for her her wound seems to be measuring much smaller today compared to last week's evaluation this is excellent. Overall she continues to make good progress. 12/14/2018 on evaluation today patient appears to be doing well with regard to her toe ulcer. This is showing signs of improvement which is good news. Overall I am very pleased With how things appear. There is no signs of active  infection at this time. I do feel like the wound is appearing much more healthy based on what I see today. Today is Dermagraft application #3. ADMISSION 05/31/2019 The patient's previous records from our office in [place] are included above. The patient is here for a deep probing wound on the plantar left heel. 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 It would appear that this started sometime in December although the details are vague. She was in urgent care on 04/14/2019 with what was felt to be a wound infection in the left foot there was noted to be drainage and odor. She was put on doxycycline and x-ray at that time showed no osteomyelitis. She was back in the ER on 04/22/2019 with increasing swelling and redness on the plantar foot. There is a picture in epic to reflect this really looked quite impressive. She adamantly refused admission. She was given clindamycin again another x-ray was negative but noted soft tissue swelling. The patient was largely in [place] from June to September with a wound on the left plantar toe. Dermagraft was tried currently she was discharged on healed when the patient's insurance stopped paying for her to come there. She dropped out of the clinic at her own insistence. The patient is not on any current antibiotics. I am not clear how she is dressing this. The area on the left great toe has eschared over Past medical history; type 2 diabetes with neuropathy, hypertension, chronic left toe ulcer, history of small bowel obstruction secondary to hernia status post ventral hernia repair ABIs in our clinic were 1.12 on the left. The patient had formal arterial studies in October 2016 at that point her ABI was 1.2 TBI at 1.02 on the right triphasic waveforms. On the left ABI at 1.24 TBI was not done probably because of bandaging triphasic waveforms 06/07/2019; the patient has a new wound this week on the plantar part of her  left first toe. This had eschar on it last week I did not debride this. Culture of the area last week showed group B strep and a few Morganella morganii. The latter would not be covered by the empiric doxycycline I gave her. We use silver alginate and  the heel wound which I think the patient is changing on her own We also have problems with the MRI. The patient arrived in the clinic saying her insurance that she already had an MRI and therefore would not pay for it. We looked through epic in [place] link. There is no MRI in either area. She had to negative plain x-rays for osteomyelitis which does not preclude considering an MRI in this diabetic woman with a wound that probes to bone 3/24; patient still has a deep probing area on the plantar aspect of her heel. Her MRI is on 06/19/2019 I would be surprised if this did not show osteomyelitis. He also has an area on her plantar toe that opened again last week. We have been using silver alginate to both wound areas. She is completing the ciprofloxacin that I gave her. This was to cover the Morganella on her culture listed on 3/17. It also came out today that the patient has not changed her dressing since last Saturday which might account for some of the odor and the maceration around the wound. She is managing her own dressings at home and I would be surprised if she was exactly doing this properly even applying the dressing level on the frequency. We may need to spend a little more time with her next visit 06/23/2019 upon evaluation today patient presents for reevaluation regarding her heel ulcer. We actually did have her MRI which was reviewed today during her evaluation. Subsequently it does appear that the patient may have some slight irregularity of the heel which included marrow edema of the plantar calcaneus towards the lateral side and this is likely a small focus of osteomyelitis early. In light of the fact that the patient has an ulcer here  that probes all the way down to the bone area pretty much in the location described. There was also the obvious wound noted of her heel. There did not appear to be any finding consistent with osteomyelitis of the toe where she has a longstanding ulcer at this site as well. I have not seen this patient upon readmission here to [facility] as I previously saw her mainly in [place] but the toe appears to be doing much better to me today compared to where it used to be. Overall her heel however it is the more significant issue here. JEIDY, LEVITAN ([id number]) Objective Constitutional Well-nourished and well-hydrated in no acute distress. Vitals Time Taken: 10:54 AM, Height: 64 in, Weight: 230 lbs, BMI: 39.5, Temperature: 98.4 F, Pulse: 82 bpm, Respiratory Rate: 18 breaths/min, Blood Pressure: 169/73 mmHg. Respiratory normal breathing without difficulty. Psychiatric this patient is able to make decisions and demonstrates good insight into disease process. Alert and Oriented x 3. pleasant and cooperative. 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 Fortunately she is not having any significant pain though I believe this is more due to neuropathy. Integumentary (Hair, Skin) Wound #1 status is Open. Original cause of wound was Gradually Appeared. The wound is located on the [place]. The wound measures 1.1cm length x 1cm width x 0.8cm depth; 0.864cm^2 area and 0.691cm^3 volume. There is Fat Layer (Subcutaneous Tissue) Exposed exposed. There is no  tunneling or undermining noted. There is a large amount of serosanguineous drainage noted. The wound margin is epibole. There is  no granulation within the wound bed. There is a large (67-100%) amount of necrotic tissue within the wound bed including Adherent Slough. Wound #2 status is Open. Original cause of wound was Gradually Appeared. The wound is located on the [employer]. The wound measures 0.2cm length x 0.2cm width x 0.2cm depth; 0.031cm^2 area and 0.006cm^3 volume. There is Fat Layer (Subcutaneous Tissue) Exposed exposed. There is a medium amount of serosanguineous drainage noted. The wound margin is flat and intact. There is small (1-33%) red granulation within the wound bed. There is a small (1-33%) amount of necrotic tissue within the wound bed including Eschar and Adherent Slough. 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 There was a Excisional Skin/Subcutaneous Tissue Debridement with a total area of 1.1 sq cm performed by STONE III, Daja Shuping E., PA-C. With the following instrument(s): Curette to remove Viable and Non-Viable tissue/material. Material removed includes Callus, Subcutaneous Tissue, and Slough after achieving pain control using Lidocaine. No specimens were taken. A time out was conducted at 11:35, prior to the start of the procedure. A Minimum amount of bleeding was controlled with Pressure. The procedure was tolerated well. Post Debridement Measurements: 1.1cm length x 1cm width x 0.8cm depth; 0.691cm^3 volume. Character of Wound/Ulcer Post Debridement is stable. Severity of Tissue Post Debridement is:  Muscle involvement without necrosis. Post procedure Diagnosis Wound #1: Same as Pre-Procedure AGATHE, MCCLUNEY. ([id number]) Wound #2 Pre-procedure diagnosis of Wound #2 is a Diabetic Wound/Ulcer of the Lower Extremity located on the Left,Plantar Toe Great .Severity of Tissue Pre Debridement is: Fat layer exposed. There was a Excisional Skin/Subcutaneous Tissue Debridement with a total area of 0.04 sq cm performed by STONE III, Kaelah Hayashi E., PA-C. With the following instrument(s): Curette to remove Viable and Non-Viable tissue/material. Material removed includes Callus and Subcutaneous Tissue and after achieving pain control using Lidocaine. No specimens were taken. A time out was conducted at 11:35, prior to the start of the procedure. A Minimum amount of bleeding was controlled with Pressure. The procedure was tolerated well. Post Debridement Measurements: 0.2cm length x 0.2cm width x 0.2cm depth; 0.006cm^3 volume. Character of Wound/Ulcer Post Debridement is stable. Severity of Tissue Post Debridement is: Fat layer exposed. Post procedure Diagnosis Wound #2: Same as Pre-Procedure Plan Wound Cleansing: Wound #1 Left,Plantar Foot: May shower with protection. - Do not get dressing wet. No tub bath. Wound #2 Left,Plantar Toe Great: May shower with protection. - Do not get dressing wet. No tub bath. Anesthetic (add to Medication List): Wound #1 Left,Plantar Foot: Topical Lidocaine 4% cream applied to wound bed prior to debridement (In Clinic Only). Wound #2 Left,Plantar Toe Great: Topical Lidocaine 4% cream applied to wound bed prior to debridement (In Clinic Only). Primary Wound Dressing: Wound #1 Left,Plantar Foot: Silver Alginate - packed lightly into wound Wound #2 Left,Plantar Toe Great: Silver Alginate Other: - Coverlet Secondary Dressing: Wound #1 Left,Plantar Foot: ABD and Kerlix/Conform Dressing Change Frequency: Wound #1 Left,Plantar  Foot: Change Dressing Monday, Wednesday,  Friday - Or more often if wet. Wound #2 Left,Plantar Toe Great: Change Dressing Monday, Wednesday, Friday Follow-up Appointments: Wound #1 Left,Plantar Foot: Return Appointment in 1 week. Wound #2 Left,Plantar Toe Great: Return Appointment in 1 week. Off-Loading: Wound #1 Left,Plantar Foot: Heel suspension boot Wound #2 Left,Plantar Toe Great: Heel suspension boot Additional Orders / Instructions: Wound #1 Left,Plantar Foot: Increase protein intake. Wound #2 Left,Plantar Toe Great: Increase protein intake. Medications-please add to medication list.: Wound #1 Left,Plantar Foot: P.O. Antibiotics - Cipro Consults ordered were: Infectious Disease - [place] [facility] The following medication(s) was prescribed: Cipro oral 500 mg tablet 1 1 tablet oral taken 2 times per day for 14 days starting 06/23/2019 FOYE, LORENCE ([id number]) 1 my suggestion at this time is good to be that we go ahead and make a referral to infectious disease to see if they will be willing to help [place] in treating the osteomyelitis. Also think the patient would be a candidate for hyperbaric oxygen therapy. 2. I would recommend for the time being that we continue to pack the silver alginate lightly into the wound of the calcaneus I think we can also use this over the toe area as well. 3. We will use an ABD and roll gauze to secure in place. 4. I am also going to suggest that the patient continue with the oral antibiotics prescribed and subsequently we will see what infectious disease recommends when she sees them following. Cipro 500 mg was prescribed for her again today. We will see patient back for reevaluation in 1 week here in the clinic. If anything worsens or changes patient will contact our office for additional recommendations. Electronic Signature(s) Signed: 06/26/2019 9:02:41 AM By: Worthy Keeler PA-C Entered By: Worthy Keeler on 06/26/2019 09:02:40 [name]  ([id number]) -------------------------------------------------------------------------------- SuperBill Details Patient Name: [name]. Date of Service: 06/23/2019 Medical Record Number: [id number] Patient Account Number: [id number] Date of Birth/Sex: 1945/04/25 (74 y.o. F) Treating RN: Montey Hora Primary Care Provider: Vicenta Aly Other Clinician: Referring Provider: Vicenta Aly Treating Provider/Extender: Melburn Hake, Vershawn Westrup Weeks in Treatment: 3 Diagnosis Coding ICD-10 Codes Code Description E11.621 Type 2 diabetes mellitus with foot ulcer L97.523 Non-pressure chronic ulcer of other part of left foot with necrosis of muscle L97.522 Non-pressure chronic ulcer of other part of left foot with fat layer exposed E11.42 Type 2 diabetes mellitus with diabetic polyneuropathy M14.672 Charcot's joint, left ankle and foot Facility Procedures CPT4 Code: [id number] Description: 11042 - DEB SUBQ TISSUE 20 SQ CM/< Modifier: Quantity: 1 CPT4 Code: Description: ICD-10 Diagnosis Description L97.523 Non-pressure chronic ulcer of other part of left foot with necrosis of musc L97.522 Non-pressure chronic ulcer of other part of left foot with fat layer expose Modifier: le d Quantity: Physician Procedures CPT4 Code: [id number] Description: [id number] - WC PHYS LEVEL 4 - EST PT Modifier: 25 Quantity: 1 CPT4 Code: Description: ICD-10 Diagnosis Description E11.621 Type 2 diabetes mellitus with foot ulcer L97.523 Non-pressure chronic ulcer of other part of left foot with necrosis of musc L97.522 Non-pressure chronic ulcer of other part of left foot with fat layer  expose E11.42 Type 2 diabetes mellitus with diabetic polyneuropathy Modifier: le d Quantity: CPT4 [id number] Description: [id number] - WC PHYS SUBQ TISS 20 SQ CM Modifier: Quantity: 1 CPT4 Code: Description: ICD-10 Diagnosis Description L97.523 Non-pressure chronic ulcer of other part of left foot with necrosis of musc L97.522  Non-pressure chronic ulcer of other part of left foot  with fat layer expose Modifier: le d Quantity: Electronic Signature(s) Signed: 06/23/2019 11:47:52 AM By: Worthy Keeler PA-C Previous Signature: 06/23/2019 11:47:40 AM Version By: Worthy Keeler PA-C Entered By: Worthy Keeler on 06/23/2019 11:47:51

## 2019-06-23 NOTE — Progress Notes (Addendum)
Debra, [name] ([id number]) Visit Report for 06/23/2019 Arrival Information Details Patient Name: Debra [name], Debra [name]. Date of Service: 06/23/2019 10:45 AM Medical Record Number: [id number] Patient Account Number: [id number] Date of Birth/Sex: [birth year] (73 y.o. F) Treating RN: Cornell Barman Primary Care Brissa Asante: Vicenta Aly Other Clinician: Referring Altie Savard: Vicenta Aly Treating Rathana Viveros/Extender: Melburn Hake, HOYT Weeks in Treatment: 3 Visit Information History Since Last Visit Pain Present Now: Yes Patient Arrived: Cane Arrival Time: 11:31 Accompanied By: self Transfer Assistance: None Patient Identification Verified: Yes Secondary Verification Process Completed: Yes Electronic Signature(s) Signed: 06/26/2019 11:52:59 AM By: Gretta Cool, BSN, RN, CWS, Kim RN, BSN Entered By: Gretta Cool, BSN, RN, CWS, Kim on 06/23/2019 11:31:58 [name] ([id number]) -------------------------------------------------------------------------------- Encounter Discharge Information Details Patient Name: Debra [name], [name] Date of Service: 06/23/2019 10:45 AM Medical Record Number: [id number] Patient Account Number: [id number] Date of Birth/Sex: [birth year] (73 y.o. F) Treating RN: Cornell Barman Primary Care Sameka Bagent: Vicenta Aly Other Clinician: Referring Cheryll Keisler: Vicenta Aly Treating Makeshia Seat/Extender: Melburn Hake, HOYT Weeks in Treatment: 3 Encounter Discharge Information Items Post Procedure Vitals Discharge Condition: Stable Temperature (F): 98.4 Ambulatory Status: Cane Pulse (bpm): 82 Discharge Destination: Home Respiratory Rate (breaths/min): 18 Transportation: Private Auto Blood Pressure (mmHg): 169/73 Accompanied By: self Schedule Follow-up Appointment: Yes Clinical Summary of Care: Electronic Signature(s) Signed: 06/26/2019 11:52:59 AM By: Gretta Cool, BSN, RN, CWS, Kim RN, BSN Entered By: Gretta Cool, BSN, RN, CWS, Kim on 06/23/2019 12:07:29 [name]  ([id number]) -------------------------------------------------------------------------------- Lower Extremity Assessment Details Patient Name: Debra [name], [name] Date of Service: 06/23/2019 10:45 AM Medical Record Number: [id number] Patient Account Number: [id number] Date of Birth/Sex: 1945/05/13 (73 y.o. F) Treating RN: Montey Hora Primary Care Kyriakos Babler: Vicenta Aly Other Clinician: Referring Guyla Bless: Vicenta Aly Treating Linas Stepter/Extender: Melburn Hake, HOYT Weeks in Treatment: 3 Edema Assessment Assessed: [Left: No] [Right: No] Edema: [Left: Ye] [Right: s] Calf Left: Right: Point of Measurement: 25 cm From Medial Instep 45 cm cm Ankle Left: Right: Point of Measurement: 13 cm From Medial Instep 29 cm cm Vascular Assessment Pulses: Dorsalis Pedis Palpable: [Left:Yes] Electronic Signature(s) Signed: 06/23/2019 12:28:55 PM By: Montey Hora Entered By: Montey Hora on 06/23/2019 11:11:47 Mcginley, Estanislado Spire ([id number]) -------------------------------------------------------------------------------- Multi Wound Chart Details Patient Name: Debra Devon L. Date of Service: 06/23/2019 10:45 AM Medical Record Number: [id number] Patient Account Number: [id number] Date of Birth/Sex: Sep 30, 1945 (73 y.o. F) Treating RN: Cornell Barman Primary Care Harleigh Civello: Vicenta Aly Other Clinician: Referring Kenetra Hildenbrand: Vicenta Aly Treating Iyonnah Ferrante/Extender: Melburn Hake, HOYT Weeks in Treatment: 3 Vital Signs Height(in): 64 Pulse(bpm): 82 Weight(lbs): 230 Blood Pressure(mmHg): 169/73 Body Mass Index(BMI): 39 Temperature(F): 98.4 Respiratory Rate(breaths/min): 18 Photos: [N/A:N/A] Wound Location: Left, Plantar Foot Left, Plantar Toe Great N/A Wounding Event: Gradually Appeared Gradually Appeared N/A Primary Etiology: Diabetic Wound/Ulcer of the Lower Diabetic Wound/Ulcer of the Lower N/A Extremity Extremity Comorbid History: Cataracts, Hypertension, Type II Cataracts, Hypertension,  Type II N/A Diabetes, Osteoarthritis Diabetes, Osteoarthritis Date Acquired: 05/09/2019 05/09/2019 N/A Weeks of Treatment: 3 3 N/A Wound Status: Open Open N/A Measurements L x W x D (cm) 1.1x1x0.8 0.2x0.2x0.2 N/A Area (cm) : 0.864 0.031 N/A Volume (cm) : 0.691 0.006 N/A % Reduction in Area: -205.30% 84.20% N/A % Reduction in Volume: 6.00% 70.00% N/A Classification: Grade 2 Grade 1 N/A Exudate Amount: Large Medium N/A Exudate Type: Serosanguineous Serosanguineous N/A Exudate Color: red, brown red, brown N/A Wound Margin: Epibole Flat and Intact N/A Granulation Amount: None Present (0%) Small (1-33%) N/A Granulation Quality: N/A Red N/A Necrotic Amount: Large (67-100%) Small (1-33%) N/A Necrotic Tissue: Adherent  Ashley, Madelia N/A Exposed Structures: Fat Layer (Subcutaneous Tissue) Fat Layer (Subcutaneous Tissue) N/A Exposed: Yes Exposed: Yes Fascia: No Fascia: No Tendon: No Tendon: No Muscle: No Muscle: No Joint: No Joint: No Bone: No Bone: No Epithelialization: None None N/A Treatment Notes Electronic Signature(s) Signed: 06/26/2019 11:52:59 AM By: Gretta Cool, BSN, RN, CWS, Kim RN, BSN [address], [place] ([id number]) Entered By: Gretta Cool, BSN, RN, CWS, Kim on 06/23/2019 11:33:49 [name] ([id number]) -------------------------------------------------------------------------------- [place] Details Patient Name: Debra [name]. Date of Service: 06/23/2019 10:45 AM Medical Record Number: [id number] Patient Account Number: [id number] Date of Birth/Sex: 1945/07/31 (73 y.o. F) Treating RN: Cornell Barman Primary Care Baylon Santelli: Vicenta Aly Other Clinician: Referring Harry Bark: Vicenta Aly Treating Bevin Mayall/Extender: Melburn Hake, HOYT Weeks in Treatment: 3 Active Inactive Necrotic Tissue Nursing Diagnoses: Impaired tissue integrity related to necrotic/devitalized tissue Goals: Necrotic/devitalized tissue will be minimized in the wound  bed Date Initiated: 05/31/2019 Target Resolution Date: 06/14/2019 Goal Status: Active Patient/caregiver will verbalize understanding of reason and process for debridement of necrotic tissue Date Initiated: 05/31/2019 Target Resolution Date: 06/14/2019 Goal Status: Active Interventions: Assess patient pain level pre-, during and post procedure and prior to discharge Treatment Activities: Apply topical anesthetic as ordered : 05/31/2019 Excisional debridement : 05/31/2019 Notes: Pain, Acute or Chronic Nursing Diagnoses: Pain, acute or chronic: actual or potential Goals: Patient/caregiver will verbalize comfort level met Date Initiated: 05/31/2019 Target Resolution Date: 06/21/2019 Goal Status: Active Interventions: Assess comfort goal upon admission Treatment Activities: Administer pain control measures as ordered : 05/31/2019 Notes: Peripheral Neuropathy Nursing Diagnoses: Knowledge deficit related to disease process and management of peripheral neurovascular dysfunction Goals: Patient/caregiver will verbalize understanding of disease process and disease management Date Initiated: 05/31/2019 Target Resolution Date: 06/14/2019 Goal Status: Active RYIN, SCHILLO ([id number]) Interventions: Assess signs and symptoms of neuropathy upon admission and as needed Notes: Wound/Skin Impairment Nursing Diagnoses: Impaired tissue integrity Goals: Patient/caregiver will verbalize understanding of skin care regimen Date Initiated: 05/31/2019 Target Resolution Date: 06/14/2019 Goal Status: Active Ulcer/skin breakdown will have a volume reduction of 30% by week 4 Date Initiated: 05/31/2019 Target Resolution Date: 07/01/2019 Goal Status: Active Interventions: Assess patient/caregiver ability to obtain necessary supplies Provide education on ulcer and skin care Notes: Electronic Signature(s) Signed: 06/26/2019 11:52:59 AM By: Gretta Cool, BSN, RN, CWS, Kim RN, BSN Entered By: Gretta Cool, BSN, RN, CWS, Kim  on 06/23/2019 11:33:39 [name] ([id number]) -------------------------------------------------------------------------------- Pain Assessment Details Patient Name: TECKLA, CHRISTIANSEN L. Date of Service: 06/23/2019 10:45 AM Medical Record Number: [id number] Patient Account Number: [id number] Date of Birth/Sex: [birth year] (73 y.o. F) Treating RN: Montey Hora Primary Care Varsha Knock: Vicenta Aly Other Clinician: Referring Shahmeer Bunn: Vicenta Aly Treating Makira Holleman/Extender: Melburn Hake, HOYT Weeks in Treatment: 3 Active Problems Location of Pain Severity and Description of Pain Patient Has Paino Yes Site Locations Pain Location: Pain in Ulcers Duration of the Pain. Constant / Intermittento Intermittent Character of Pain Describe the Pain: Burning Pain Management and Medication Current Pain Management: Is the Current Pain Management Adequate: Inadequate Modify pain management plan as noted: Readdress with PCP Goals for Pain Management Encouraged patient to follow up with PCP, states she saw her yesterday and was told that NSAIDs are only option currently. Electronic Signature(s) Signed: 06/23/2019 12:28:55 PM By: Montey Hora Entered By: Montey Hora on 06/23/2019 11:01:21 [name] ([id number]) -------------------------------------------------------------------------------- Wound Assessment Details Patient Name: Debra [name], [name] Date of Service: 06/23/2019 10:45 AM Medical Record Number: [id number] Patient Account Number: [id number] Date of Birth/Sex: 02-05-1946 (73 y.o. F)  Treating RN: Montey Hora Primary Care Jemarcus Dougal: Vicenta Aly Other Clinician: Referring Criselda Starke: Vicenta Aly Treating Julisa Flippo/Extender: Melburn Hake, HOYT Weeks in Treatment: 3 Wound Status Wound Number: 1 Primary Etiology: Diabetic Wound/Ulcer of the Lower Extremity Wound Location: Left, Plantar Foot Wound Status: Open Wounding Event: Gradually Appeared Comorbid Cataracts,  Hypertension, Type II Diabetes, History: Osteoarthritis Date Acquired: 05/09/2019 Weeks Of Treatment: 3 Clustered Wound: No Photos Wound Measurements Length: (cm) 1.1 % Redu Width: (cm) 1 % Redu Depth: (cm) 0.8 Epithe Area: (cm) 0.864 Tunne Volume: (cm) 0.691 Under ction in Area: -205.3% ction in Volume: 6% lialization: None ling: No mining: No Wound Description Classification: Grade 2 Foul O Wound Margin: Epibole Slough Exudate Amount: Large Exudate Type: Serosanguineous Exudate Color: red, brown dor After Cleansing: No /Fibrino Yes Wound Bed Granulation Amount: None Present (0%) Exposed Structure Necrotic Amount: Large (67-100%) Fascia Exposed: No Necrotic Quality: Adherent Slough Fat Layer (Subcutaneous Tissue) Exposed: Yes Tendon Exposed: No Muscle Exposed: No Joint Exposed: No Bone Exposed: No Treatment Notes Wound #1 (Left, Plantar Foot) 1. Cleansed with: Clean wound with Normal Saline 4. Dressing Applied: Other dressing (specify in notes) 5. Secondary Dressing Applied ABD Pad Midkiff, Kenzly L. ([id number]) 6. Footwear/Offloading device applied Surgical shoe 7. Secured with Tape Tubigrip Notes Silvercell Ag, ABD, Rolled Gauze, Tubigrip Electronic Signature(s) Signed: 06/23/2019 12:28:55 PM By: Montey Hora Signed: 06/26/2019 11:52:59 AM By: Gretta Cool, BSN, RN, CWS, Kim RN, BSN Entered By: Gretta Cool, BSN, RN, CWS, Kim on 06/23/2019 11:30:38 [name] ([id number]) -------------------------------------------------------------------------------- Wound Assessment Details Patient Name: Debra [name], [name] Date of Service: 06/23/2019 10:45 AM Medical Record Number: [id number] Patient Account Number: [id number] Date of Birth/Sex: 1946-02-21 (73 y.o. F) Treating RN: Montey Hora Primary Care Johniece Hornbaker: Vicenta Aly Other Clinician: Referring Aava Deland: Vicenta Aly Treating Kennedi Lizardo/Extender: Melburn Hake, HOYT Weeks in Treatment: 3 Wound Status Wound Number:  2 Primary Etiology: Diabetic Wound/Ulcer of the Lower Extremity Wound Location: Left, Plantar Toe Great Wound Status: Open Wounding Event: Gradually Appeared Comorbid Cataracts, Hypertension, Type II Diabetes, History: Osteoarthritis Date Acquired: 05/09/2019 Weeks Of Treatment: 3 Clustered Wound: No Photos Wound Measurements Length: (cm) 0.2 % Red Width: (cm) 0.2 % Red Depth: (cm) 0.2 Epith Area: (cm) 0.031 Volume: (cm) 0.006 uction in Area: 84.2% uction in Volume: 70% elialization: None Wound Description Classification: Grade 1 Foul Wound Margin: Flat and Intact Sloug Exudate Amount: Medium Exudate Type: Serosanguineous Exudate Color: red, brown Odor After Cleansing: No h/Fibrino Yes Wound Bed Granulation Amount: Small (1-33%) Exposed Structure Granulation Quality: Red Fascia Exposed: No Necrotic Amount: Small (1-33%) Fat Layer (Subcutaneous Tissue) Exposed: Yes Necrotic Quality: Eschar, Adherent Slough Tendon Exposed: No Muscle Exposed: No Joint Exposed: No Bone Exposed: No Treatment Notes Wound #2 (Left, Plantar Toe Great) 1. Cleansed with: Clean wound with Normal Saline 4. Dressing Applied: Other dressing (specify in notes) 5. Secondary Dressing Applied ABD Pad Doenges, Devony L. ([id number]) 6. Footwear/Offloading device applied Surgical shoe 7. Secured with Tape Tubigrip Notes Silvercell Ag, ABD, Rolled Gauze, Tubigrip Electronic Signature(s) Signed: 06/23/2019 12:28:55 PM By: Montey Hora Entered By: Montey Hora on 06/23/2019 11:12:55 [name] ([id number]) -------------------------------------------------------------------------------- Vitals Details Patient Name: Debra Devon L. Date of Service: 06/23/2019 10:45 AM Medical Record Number: [id number] Patient Account Number: [id number] Date of Birth/Sex: November 26, 1945 (73 y.o. F) Treating RN: Montey Hora Primary Care Emmalynn Pinkham: Vicenta Aly Other Clinician: Referring Tremaine Earwood: Vicenta Aly Treating Lyrah Bradt/Extender: Melburn Hake, HOYT Weeks in Treatment: 3 Vital Signs Time Taken: 10:54 Temperature (F): 98.4 Height (in): 64 Pulse (bpm): 82 Weight (  lbs): 230 Respiratory Rate (breaths/min): 18 Body Mass Index (BMI): 39.5 Blood Pressure (mmHg): 169/73 Reference Range: 80 - 120 mg / dl Electronic Signature(s) Signed: 06/26/2019 11:52:59 AM By: Gretta Cool, BSN, RN, CWS, Kim RN, BSN Entered By: Gretta Cool, BSN, RN, CWS, Kim on 06/23/2019 11:31:43

## 2019-06-28 ENCOUNTER — Encounter: Payer: Medicare Other | Admitting: Internal Medicine

## 2019-06-28 ENCOUNTER — Other Ambulatory Visit: Payer: Self-pay

## 2019-06-28 DIAGNOSIS — E11621 Type 2 diabetes mellitus with foot ulcer: Secondary | ICD-10-CM | POA: Diagnosis not present

## 2019-06-29 NOTE — Progress Notes (Signed)
ANDREAS, EVANGELISTA ([id number]) Visit Report for 06/28/2019 HPI Details Patient Name: Debra [name], Debra [name]. Date of Service: 06/28/2019 10:45 AM Medical Record Number: [id number] Patient Account Number: [id number] Date of Birth/Sex: [birth year] (74 y.o. 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 She tells me she is had a wound on the plantar aspect of her left great toe since August. There was swelling and redness and she was diagnosed with cellulitis. For a period of time she was on 2 different antibiotics 1 for a UTI and 1 for the toe and things actually got better. As far as I'm aware she has not had cultures or x-rays of the area. Although she states she has arterial disease the ABI in the right was 1.03 in our clinic on the left 1.08. I'm not certain how she is dressing this currently although she is certainly not offloading. She also has more recently developed a weeping wound on the left anterior leg and 2 small areas on the right anterior leg. She probably has chronic venous insufficiency with inflammation bilaterally. I suspect she also has development of a Charcot foot on the left 01/25/15; she still has a denuded blister on the right leg there is no open area on the left leg. On her left great toe plantar aspect the wound required a surgical debridement to remove skin nonviable subcutaneous tissue post debridement this actually cleans up quite nicely there is no overt infection here no evidence of soft tissue infection or ischemia 02/01/15; the patient arrives with poorly controlled edema bilateral legs. She has a open wound on the right leg. She has not been wearing the healing sagittals and therefore the left  plantar toe wound is not better with surrounding callus Readmission: 09/07/18 on evaluation today patient actually presents for initial inspection our clinic concerning issues that she's been having with her left great toe along with the bilateral lower extremities. The lower Trinity ulcers appear to be a combination of Venus and diabetic and the great toe ulcer is a diabetic neuropathy type ulcer. Unfortunately she's had these for quite some time in regard to the toe and the right lower extremity left lower Trinity ulcers she tells me have appeared in the past week. There's no sign of infection at any site which is good news. In regard to the toe ulcer I see no evidence of bone exposure which is good news. There was some question as to whether or not bone was exposed when she saw her primary care provider. The patient does have a history as well of hypertension. 09/14/18 on evaluation today patient's wound to lower extremity actually appear to be doing excellent. In fact the biggest thing I see is that the alginate kind of got stuck to the wound bed's which is the main issue here. Fortunately there's no signs of active infection which is good news. No fevers, chills, nausea, or vomiting noted at this time. 09/21/18 on evaluation today patient actually appears to be doing excellent in regard to her bilateral lower extremities both are healed. In regard to her left great toe ulcer this is not completely healed at this point in fact it really does show some signs of callous buildup as well as Slough in the service wound although it's minimal. No fevers,  chills, nausea, or vomiting noted at this time. 09/28/18 on evaluation today patient actually appears to be doing okay in regard to her toe ulcer. Unfortunately she does have some issues with the wound. Somewhat dry but it sounds like she is not been putting the collagen on the wound bed. She has been just utilizing a Dry gauze dressing. She states that she  "couldn't find the collagen". Nonetheless I think this is why the wound bed appears to be so dry today. Obviously we do not want it to wet macerated but we also do not want it to dry. 10/12/18 on evaluation today patient appears to be doing about the same in regard to her toe ulcer. She's been tolerating the dressing changes without complication. With that being said she did not want to feed more in the past few days she tells me and it does show in the evidence of her lower extremity swelling at this point. Fortunately there does not appear to be any signs of active infection at this time. With that being said she's been tolerating the dressing changes without complication. No fevers, chills, nausea, or vomiting noted at this time. The7/29/2020 on evaluation today patient actually appears to be doing quite well with regard to her toe ulcer. This is showing signs of improvement and in fact wound is measuring about 60% the size it was last evaluation. This is excellent news. Overall I am very pleased with how she seems to be progressing she does tell me she is taking more breaks and trying to stay off of it as much as possible. 10/26/2018 on evaluation today patient actually appears to be doing quite well with regard to her wounds on the wound on the left great toe. She has been tolerating the dressing changes without complication. Fortunately there is no signs of active infection at this time. Overall very pleased with the progress that is been made up to this point. With that being said she seems to be stalled out at this time and I think we may need to try something a little different in order to get this to completely heal. 11/02/2018 upon evaluation today patient actually appears to be doing well with regard to her toe ulcer from the standpoint of the overall appearance. With that being said she is having some issues currently with the left lower extremity where she has a small blister that opened in  the past 24 hours that does need to be addressed today as well. Fortunately there is no sign of active infection at this time which is good news. 11/09/2018 on evaluation today patient actually appears to be doing a little better in regard to her toe ulcer. She has been tolerating the dressing Pickelsimer, Naika L. ([id number]) changes without complication. Fortunately there is no signs of active infection at this time. No fevers, chills, nausea, vomiting, or diarrhea. Overall I am actually rather pleased with how things seem to be progressing at this time. 11/16/2018 on evaluation today patient's toe ulcer appears to be doing quite well which is good news. Fortunately there is no signs of active infection which is also good news. No fevers, chills, nausea, vomiting, or diarrhea. As of this point we still have not heard anything back about the Dermagraft and whether or not this will be approved. 11/23/18 upon evaluation today patient actually appears to be doing quite well with regard to her toe ulcer all things considered. We still not gotten approval from Dermagraft as of yet. Obviously the  sooner we can get this to better in my pinion nonetheless there's gonna be some need for sharp debridement today based on what I'm seeing. 11/30/2018 on evaluation today patient actually appears to be doing about the same with regard to her toe ulcer. Fortunately there is no severe evidence of callus buildup which is good news. With that being said I definitely think that she will do well with the Dermagraft which fortunately we have gotten approved at this time. There is no signs of infection so I think this is absolutely appropriate for [place] to attempt at this point. 12/07/2018 on evaluation today patient appears to be doing much better with regard to her toe ulcer at this time. Fortunately there is no signs of active infection at this point. She has been tolerating the dressing changes without complication. No fevers,  chills, nausea, vomiting, or diarrhea. I do believe the Dermagraft has been beneficial for her her wound seems to be measuring much smaller today compared to last week's evaluation this is excellent. Overall she continues to make good progress. 12/14/2018 on evaluation today patient appears to be doing well with regard to her toe ulcer. This is showing signs of improvement which is good news. Overall I am very pleased With how things appear. There is no signs of active infection at this time. I do feel like the wound is appearing much more healthy based on what I see today. Today is Dermagraft application #3. ADMISSION 05/31/2019 The patient's previous records from our office in [place] are included above. The patient is here for a deep probing wound on the plantar left heel. 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 It would appear that this started sometime in December although the details are vague. She was in urgent care on 04/14/2019 with what was felt to be a wound infection in the left foot there was noted to be drainage and odor. She was put on doxycycline and x-ray at that time showed no osteomyelitis. She was back in the ER on 04/22/2019 with increasing swelling and redness on the plantar foot. There is a picture in epic to reflect this really looked quite impressive. She adamantly refused admission. She was given clindamycin again another x-ray was negative but noted soft tissue swelling. The patient was largely in [place] from June to September with a wound on the left plantar toe. Dermagraft was tried currently she was discharged on healed when the patient's insurance stopped paying for her to come there. She dropped out of the clinic at her own insistence. The patient is not on any current antibiotics. I am not clear how she is dressing this. The area on the left great toe has eschared over Past medical history; type 2 diabetes with  neuropathy, hypertension, chronic left toe ulcer, history of small bowel obstruction secondary to hernia status post ventral hernia repair ABIs in our clinic were 1.12 on the left. The patient had formal arterial studies in October 2016 at that point her ABI was 1.2 TBI at 1.02 on the right triphasic waveforms. On the left ABI at 1.24 TBI was not done probably because of bandaging triphasic waveforms 06/07/2019; the patient has a new wound this week on the plantar part of her left first toe. This had eschar on it last week I did not debride this. Culture of the area last week showed group B strep and a few Morganella morganii. The latter would not be covered by  the empiric doxycycline I gave her. We use silver alginate and the heel wound which I think the patient is changing on her own We also have problems with the MRI. The patient arrived in the clinic saying her insurance that she already had an MRI and therefore would not pay for it. We looked through epic in [place] link. There is no MRI in either area. She had to negative plain x-rays for osteomyelitis which does not preclude considering an MRI in this diabetic woman with a wound that probes to bone 3/24; patient still has a deep probing area on the plantar aspect of her heel. Her MRI is on 06/19/2019 I would be surprised if this did not show osteomyelitis. He also has an area on her plantar toe that opened again last week. We have been using silver alginate to both wound areas. She is completing the ciprofloxacin that I gave her. This was to cover the Morganella on her culture listed on 3/17. It also came out today that the patient has not changed her dressing since last Saturday which might account for some of the odor and the maceration around the wound. She is managing her own dressings at home and I would be surprised if she was exactly doing this properly even applying the dressing level on the frequency. We may need to spend a little  more time with her next visit 06/23/2019 upon evaluation today patient presents for reevaluation regarding her heel ulcer. We actually did have her MRI which was reviewed today during her evaluation. Subsequently it does appear that the patient may have some slight irregularity of the heel which included marrow edema of the plantar calcaneus towards the lateral side and this is likely a small focus of osteomyelitis early. In light of the fact that the patient has an ulcer here that probes all the way down to the bone area pretty much in the location described. There was also the obvious wound noted of her heel. There did not appear to be any finding consistent with osteomyelitis of the toe where she has a longstanding ulcer at this site as well. I have not seen this patient upon readmission here to [facility] as I previously saw her mainly in [place] but the toe appears to be doing much better to me today compared to where it used to be. Overall her heel however it is the more significant issue here. 4/7; the patient's MRI surprisingly did not show definitive osteomyelitis. It did show a small underlying fluid collection compatible with an abscess. Very small focus of marrow edema in the plantar calcaneus towards the lateral side could be a small focus of osteomyelitis but might really represent reactive change. She is currently on ciprofloxacin 500 twice daily for 2 weeks prescribed last week by Jeri Cos. Debra [name], Debra [name] ([id number]) She has been using silver alginate to both wounds on the plantar heel and on the plantar left great toe. The left great toe wound appears superficial. She is still complaining of some discomfort around the heel area. She is doing her best to offload this Electronic Signature(s) Signed: 06/28/2019 4:54:03 PM By: Linton Ham MD Entered By: Linton Ham on 06/28/2019 11:16:58 Debra [name]  ([id number]) -------------------------------------------------------------------------------- Physical Exam Details Patient Name: KAYLIAH, TUFFY L. Date of Service: 06/28/2019 10:45 AM Medical Record Number: [id number] Patient Account Number: [id number] Date of Birth/Sex: 07-28-1945 (74 y.o. F) Treating RN: Cornell Barman Primary Care Provider: Vicenta Aly Other Clinician: Referring Provider: Vicenta Aly Treating Provider/Extender:  Shogo Larkey G Weeks in Treatment: 4 Constitutional Patient is hypertensive.. Pulse regular and within target range for patient.Marland Kitchen Respirations regular, non-labored and within target range.. Temperature is normal and within the target range for the patient.Marland Kitchen appears in no distress. Notes Wound exam; 1The area on the left great toe plantar aspect is superficial and looks like it may be reclosing 2-the area in the middle of the plantar heel is worrisome. There is still tenderness around the wound and all the open although the orifice of the wound is small this probes right to bone or to use minimal amount of tissue over bone. There is clearly some undermining along the surface of the calcaneus although it is very difficult to measure still some serous drainage but no purulence. There is no erythema around the wound. Electronic Signature(s) Signed: 06/28/2019 4:54:03 PM By: Linton Ham MD Entered By: Linton Ham on 06/28/2019 11:18:54 Debra [name] ([id number]) -------------------------------------------------------------------------------- Physician Orders Details Patient Name: MEGEN, SHECKLER L. Date of Service: 06/28/2019 10:45 AM Medical Record Number: [id number] Patient Account Number: [id number] Date of Birth/Sex: 1945/08/08 (74 y.o. F) Treating RN: Army Melia Primary Care Provider: Vicenta Aly Other Clinician: Referring Provider: Vicenta Aly Treating Provider/Extender: Tito Dine in Treatment: 4 Verbal / Phone Orders:  No Diagnosis Coding Wound Cleansing Wound #1 Left,Plantar Foot o May shower with protection. - Do not get dressing wet. o No tub bath. Wound #2 Left,Plantar Toe Great o May shower with protection. - Do not get dressing wet. o No tub bath. Anesthetic (add to Medication List) Wound #1 Left,Plantar Foot o Topical Lidocaine 4% cream applied to wound bed prior to debridement (In Clinic Only). Wound #2 Left,Plantar Toe Great o Topical Lidocaine 4% cream applied to wound bed prior to debridement (In Clinic Only). Primary Wound Dressing Wound #1 Left,Plantar Foot o Silver Alginate - packed lightly into wound Wound #2 Left,Plantar Toe Great o Silver Alginate - packed lightly into wound Secondary Dressing Wound #1 Left,Plantar Foot o ABD and Kerlix/Conform Wound #2 Left,Plantar Toe Great o ABD and Kerlix/Conform Dressing Change Frequency Wound #1 Left,Plantar Foot o Change Dressing Monday, Wednesday, Friday - Or more often if wet. Wound #2 Left,Plantar Toe Great o Change Dressing Monday, Wednesday, Friday - Or more often if wet. Follow-up Appointments Wound #1 Left,Plantar Foot o Return Appointment in 1 week. Wound #2 Left,Plantar Toe Great o Return Appointment in 1 week. Off-Loading Wound #1 Left,Plantar Foot o Heel suspension boot Wound #2 Left,Plantar Toe Great o Heel suspension boot Additional Orders / Instructions AVY, SIMONYAN. ([id number]) Wound #1 Left,Plantar Foot o Increase protein intake. Wound #2 Left,Plantar Toe Great o Increase protein intake. Electronic Signature(s) Signed: 06/28/2019 11:30:14 AM By: Army Melia Signed: 06/28/2019 4:54:03 PM By: Linton Ham MD Entered By: Army Melia on 06/28/2019 11:09:11 Debra [name] ([id number]) -------------------------------------------------------------------------------- Problem List Details Patient Name: ESHITA, COSLEY L. Date of Service: 06/28/2019 10:45 AM Medical Record  Number: [id number] Patient Account Number: [id number] Date of Birth/Sex: 06-Apr-1945 (74 y.o. F) Treating RN: Cornell Barman Primary Care Provider: Vicenta Aly Other Clinician: Referring Provider: Vicenta Aly Treating Provider/Extender: Tito Dine in Treatment: 4 Active Problems ICD-10 Evaluated Encounter Code Description Active Date Today Diagnosis E11.621 Type 2 diabetes mellitus with foot ulcer 05/31/2019 No Yes L97.523 Non-pressure chronic ulcer of other part of left foot with necrosis of muscle 05/31/2019 No Yes L97.522 Non-pressure chronic ulcer of other part of left foot with fat layer exposed 06/07/2019 No Yes L02.612 Cutaneous abscess of left foot  06/28/2019 No Yes E11.42 Type 2 diabetes mellitus with diabetic polyneuropathy 05/31/2019 No Yes M14.672 Charcot's joint, left ankle and foot 05/31/2019 No Yes Inactive Problems Resolved Problems Electronic Signature(s) Signed: 06/28/2019 4:54:03 PM By: Linton Ham MD Entered By: Linton Ham on 06/28/2019 11:12:37 Debra [name] ([id number]) -------------------------------------------------------------------------------- Progress Note Details Patient Name: ANALECIA, YTUARTE L. Date of Service: 06/28/2019 10:45 AM Medical Record Number: [id number] Patient Account Number: [id number] Date of Birth/Sex: 1945-12-24 (74 y.o. 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 She tells me she is had a wound on the plantar aspect of her left great toe since August. There was swelling and redness and she was diagnosed with cellulitis. For a period of time she was on 2 different antibiotics 1 for a UTI and 1 for the toe and things actually got  better. As far as I'm aware she has not had cultures or x-rays of the area. Although she states she has arterial disease the ABI in the right was 1.03 in our clinic on the left 1.08. I'm not certain how she is dressing this currently although she is certainly not offloading. She also has more recently developed a weeping wound on the left anterior leg and 2 small areas on the right anterior leg. She probably has chronic venous insufficiency with inflammation bilaterally. I suspect she also has development of a Charcot foot on the left 01/25/15; she still has a denuded blister on the right leg there is no open area on the left leg. On her left great toe plantar aspect the wound required a surgical debridement to remove skin nonviable subcutaneous tissue post debridement this actually cleans up quite nicely there is no overt infection here no evidence of soft tissue infection or ischemia 02/01/15; the patient arrives with poorly controlled edema bilateral legs. She has a open wound on the right leg. She has not been wearing the healing sagittals and therefore the left plantar toe wound is not better with surrounding callus Readmission: 09/07/18 on evaluation today patient actually presents for initial inspection our clinic concerning issues that she's been having with her left great toe along with the bilateral lower extremities. The lower Trinity ulcers appear to be a combination of Venus and diabetic and the great toe ulcer is a diabetic neuropathy type ulcer. Unfortunately she's had these for quite some time in regard to the toe and the right lower extremity left lower Trinity ulcers she tells me have appeared in the past week. There's no sign of infection at any site which is good news. In regard to the toe ulcer I see no evidence of bone exposure which is good news. There was some question as to whether or not bone was exposed when she saw her primary care provider. The patient does have a history  as well of hypertension. 09/14/18 on evaluation today patient's wound to lower extremity actually appear to be doing excellent. In fact the biggest thing I see is that the alginate kind of got stuck to the wound bed's which is the main issue here. Fortunately there's no signs of active infection which is good news. No fevers, chills, nausea, or vomiting noted at this time. 09/21/18 on evaluation today patient actually appears to be doing excellent in regard to her bilateral lower  extremities both are healed. In regard to her left great toe ulcer this is not completely healed at this point in fact it really does show some signs of callous buildup as well as Slough in the service wound although it's minimal. No fevers, chills, nausea, or vomiting noted at this time. 09/28/18 on evaluation today patient actually appears to be doing okay in regard to her toe ulcer. Unfortunately she does have some issues with the wound. Somewhat dry but it sounds like she is not been putting the collagen on the wound bed. She has been just utilizing a Dry gauze dressing. She states that she "couldn't find the collagen". Nonetheless I think this is why the wound bed appears to be so dry today. Obviously we do not want it to wet macerated but we also do not want it to dry. 10/12/18 on evaluation today patient appears to be doing about the same in regard to her toe ulcer. She's been tolerating the dressing changes without complication. With that being said she did not want to feed more in the past few days she tells me and it does show in the evidence of her lower extremity swelling at this point. Fortunately there does not appear to be any signs of active infection at this time. With that being said she's been tolerating the dressing changes without complication. No fevers, chills, nausea, or vomiting noted at this time. The7/29/2020 on evaluation today patient actually appears to be doing quite well with regard to her toe ulcer.  This is showing signs of improvement and in fact wound is measuring about 60% the size it was last evaluation. This is excellent news. Overall I am very pleased with how she seems to be progressing she does tell me she is taking more breaks and trying to stay off of it as much as possible. 10/26/2018 on evaluation today patient actually appears to be doing quite well with regard to her wounds on the wound on the left great toe. She has been tolerating the dressing changes without complication. Fortunately there is no signs of active infection at this time. Overall very pleased with the progress that is been made up to this point. With that being said she seems to be stalled out at this time and I think we may need to try something a little different in order to get this to completely heal. 11/02/2018 upon evaluation today patient actually appears to be doing well with regard to her toe ulcer from the standpoint of the overall appearance. With that being said she is having some issues currently with the left lower extremity where she has a small blister that opened in the past 24 hours that does need to be addressed today as well. Fortunately there is no sign of active infection at this time which is good news. 11/09/2018 on evaluation today patient actually appears to be doing a little better in regard to her toe ulcer. She has been tolerating the dressing changes without complication. Fortunately there is no signs of active infection at this time. No fevers, chills, nausea, vomiting, or diarrhea. Overall I am actually rather pleased with how things seem to be progressing at this time. 11/16/2018 on evaluation today patient's toe ulcer appears to be doing quite well which is good news. Fortunately there is no signs of active infection Debra [name], [name] ([id number]) which is also good news. No fevers, chills, nausea, vomiting, or diarrhea. As of this point we still have not heard anything back about  the  Dermagraft and whether or not this will be approved. 11/23/18 upon evaluation today patient actually appears to be doing quite well with regard to her toe ulcer all things considered. We still not gotten approval from Dermagraft as of yet. Obviously the sooner we can get this to better in my pinion nonetheless there's gonna be some need for sharp debridement today based on what I'm seeing. 11/30/2018 on evaluation today patient actually appears to be doing about the same with regard to her toe ulcer. Fortunately there is no severe evidence of callus buildup which is good news. With that being said I definitely think that she will do well with the Dermagraft which fortunately we have gotten approved at this time. There is no signs of infection so I think this is absolutely appropriate for [place] to attempt at this point. 12/07/2018 on evaluation today patient appears to be doing much better with regard to her toe ulcer at this time. Fortunately there is no signs of active infection at this point. She has been tolerating the dressing changes without complication. No fevers, chills, nausea, vomiting, or diarrhea. I do believe the Dermagraft has been beneficial for her her wound seems to be measuring much smaller today compared to last week's evaluation this is excellent. Overall she continues to make good progress. 12/14/2018 on evaluation today patient appears to be doing well with regard to her toe ulcer. This is showing signs of improvement which is good news. Overall I am very pleased With how things appear. There is no signs of active infection at this time. I do feel like the wound is appearing much more healthy based on what I see today. Today is Dermagraft application #3. ADMISSION 05/31/2019 The patient's previous records from our office in [place] are included above. The patient is here for a deep probing wound on the plantar left heel. She is a type II diabetic with peripheral neuropathy and a  Charcot foot at that time. It would appear that this started sometime in December although the details are vague. She was in urgent care on 04/14/2019 with what was felt to be a wound infection in the left foot there was noted to be drainage and odor. She was put on doxycycline and x-ray at that time showed no osteomyelitis. She was back in the ER on 04/22/2019 with increasing swelling and redness on the plantar foot. There is a picture in epic to reflect this really looked quite impressive. She adamantly refused admission. She was given clindamycin again another x-ray was negative but noted soft tissue swelling. The patient was largely in [place] from June to September with a wound on the left plantar toe. Dermagraft was tried currently she was discharged on healed when the patient's insurance stopped paying for her to come there. She dropped out of the clinic at her own insistence. The patient is not on any current antibiotics. I am not clear how she is dressing this. The area on the left great toe has eschared over Past medical history; type 2 diabetes with neuropathy, hypertension, chronic left toe ulcer, history of small bowel obstruction secondary to hernia status post ventral hernia repair ABIs in our clinic were 1.12 on the left. The patient had formal arterial studies in October 2016 at that point her ABI was 1.2 TBI at 1.02 on the right triphasic waveforms. On the left ABI at 1.24 TBI was not done probably because of bandaging triphasic waveforms 06/07/2019; the patient has a new wound this week  on the plantar part of her left first toe. This had eschar on it last week I did not debride this. Culture of the area last week showed group B strep and a few Morganella morganii. The latter would not be covered by the empiric doxycycline I gave her. We use silver alginate and the heel wound which I think the patient is changing on her own We also have problems with the MRI. The patient arrived in  the clinic saying her insurance that she already had an MRI and therefore would not pay for it. We looked through epic in [place] link. There is no MRI in either area. She had to negative plain x-rays for osteomyelitis which does not preclude considering an MRI in this diabetic woman with a wound that probes to bone 3/24; patient still has a deep probing area on the plantar aspect of her heel. Her MRI is on 06/19/2019 I would be surprised if this did not show osteomyelitis. He also has an area on her plantar toe that opened again last week. We have been using silver alginate to both wound areas. She is completing the ciprofloxacin that I gave her. This was to cover the Morganella on her culture listed on 3/17. It also came out today that the patient has not changed her dressing since last Saturday which might account for some of the odor and the maceration around the wound. She is managing her own dressings at home and I would be surprised if she was exactly doing this properly even applying the dressing level on the frequency. We may need to spend a little more time with her next visit 06/23/2019 upon evaluation today patient presents for reevaluation regarding her heel ulcer. We actually did have her MRI which was reviewed today during her evaluation. Subsequently it does appear that the patient may have some slight irregularity of the heel which included marrow edema of the plantar calcaneus towards the lateral side and this is likely a small focus of osteomyelitis early. In light of the fact that the patient has an ulcer here that probes all the way down to the bone area pretty much in the location described. There was also the obvious wound noted of her heel. There did not appear to be any finding consistent with osteomyelitis of the toe where she has a longstanding ulcer at this site as well. I have not seen this patient upon readmission here to [facility] as I previously saw her mainly in  [place] but the toe appears to be doing much better to me today compared to where it used to be. Overall her heel however it is the more significant issue here. 4/7; the patient's MRI surprisingly did not show definitive osteomyelitis. It did show a small underlying fluid collection compatible with an abscess. Very small focus of marrow edema in the plantar calcaneus towards the lateral side could be a small focus of osteomyelitis but might really represent reactive change. She is currently on ciprofloxacin 500 twice daily for 2 weeks prescribed last week by Jeri Cos. She has been using silver alginate to both wounds on the plantar heel and on the plantar left great toe. The left great toe wound appears superficial. She is still complaining of some discomfort around the heel area. She is doing her best to offload this SABRINE, KITZMAN. ([id number]) Objective Constitutional Patient is hypertensive.. Pulse regular and within target range for patient.Marland Kitchen Respirations regular, non-labored and within target range.. Temperature is normal and within  the target range for the patient.Marland Kitchen appears in no distress. Vitals Time Taken: 10:54 AM, Height: 64 in, Weight: 230 lbs, BMI: 39.5, Temperature: 97.8 F, Pulse: 90 bpm, Respiratory Rate: 16 breaths/min, Blood Pressure: 154/65 mmHg. General Notes: Wound exam; 1The area on the left great toe plantar aspect is superficial and looks like it may be reclosing 2-the area in the middle of the plantar heel is worrisome. There is still tenderness around the wound and all the open although the orifice of the wound is small this probes right to bone or to use minimal amount of tissue over bone. There is clearly some undermining along the surface of the calcaneus although it is very difficult to measure still some serous drainage but no purulence. There is no erythema around the wound. Integumentary (Hair, Skin) Wound #1 status is Open. Original cause of wound was  Gradually Appeared. The wound is located on the [place]. The wound measures 0.5cm length x 0.5cm width x 0.3cm depth; 0.196cm^2 area and 0.059cm^3 volume. There is Fat Layer (Subcutaneous Tissue) Exposed exposed. There is no tunneling or undermining noted. There is a medium amount of serosanguineous drainage noted. The wound margin is flat and intact. There is large (67-100%) pink granulation within the wound bed. There is a small (1-33%) amount of necrotic tissue within the wound bed including Adherent Slough. Wound #2 status is Open. Original cause of wound was Gradually Appeared. The wound is located on the [employer]. The wound measures 0.2cm length x 0.2cm width x 0.2cm depth; 0.031cm^2 area and 0.006cm^3 volume. There is Fat Layer (Subcutaneous Tissue) Exposed exposed. There is no tunneling or undermining noted. There is a medium amount of serosanguineous drainage noted. The wound margin is flat and intact. There is large (67-100%) red granulation within the wound bed. There is no necrotic tissue within the wound bed. 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 - Do not get dressing wet. No tub bath. Wound #2 Left,Plantar Toe Great: May shower with protection. - Do not get dressing wet. No tub bath. Anesthetic (add to Medication List): Wound #1 Left,Plantar Foot: Topical Lidocaine 4% cream applied to wound bed prior to debridement (In Clinic Only). Wound #2 Left,Plantar Toe Great: Topical Lidocaine 4% cream applied to wound bed prior to debridement (In Clinic Only). Primary Wound  Dressing: TERES, KYLES ([id number]) Wound #1 Left,Plantar Foot: Silver Alginate - packed lightly into wound Wound #2 Left,Plantar Toe Great: Silver Alginate - packed lightly into wound Secondary Dressing: Wound #1 Left,Plantar Foot: ABD and Kerlix/Conform Wound #2 Left,Plantar Toe Great: ABD and Kerlix/Conform Dressing Change Frequency: Wound #1 Left,Plantar Foot: Change Dressing Monday, Wednesday, Friday - Or more often if wet. Wound #2 Left,Plantar Toe Great: Change Dressing Monday, Wednesday, Friday - Or more often if wet. Follow-up Appointments: Wound #1 Left,Plantar Foot: Return Appointment in 1 week. Wound #2 Left,Plantar Toe Great: Return Appointment in 1 week. Off-Loading: Wound #1 Left,Plantar Foot: Heel suspension boot Wound #2 Left,Plantar Toe Great: Heel suspension boot Additional Orders / Instructions: Wound #1 Left,Plantar Foot: Increase protein intake. Wound #2 Left,Plantar Toe Great: Increase protein intake. 1. I continue to use silver alginate on  these wounds 2. I agree with the infectious disease consult. I suspect the patient still has underlying osteomyelitis or at least an abscess/deep soft tissue infection involving the heel. 3. The only culture we did was the original 1 I did on 3/10 that showed group B strep and a few Morganella. I used ciprofloxacin at that time and that has been extended for a further 2 weeks starting last week. 4. I have encouraged the patient to follow-up with infectious disease about this. She has not heard anything this week 5. Apparently hyperbaric oxygen was mentioned as an option to the patient last week after reviewing the MRI report. She proactively today stated that she is not interested in hyperbaric oxygen. I did not discuss this with her again today I am waiting for infectious disease to review the case. I suspect she has a deep soft tissue infection/wound possibly with osteomyelitis. Electronic Signature(s) Signed:  06/28/2019 4:54:03 PM By: Linton Ham MD Entered By: Linton Ham on 06/28/2019 11:50:04 Debra [name] ([id number]) -------------------------------------------------------------------------------- [place] Details Patient Name: SHERE, GENTHE L. Date of Service: 06/28/2019 Medical Record Number: [id number] Patient Account Number: [id number] Date of Birth/Sex: 1945-11-14 (74 y.o. F) Treating RN: Cornell Barman Primary Care Provider: Vicenta Aly Other Clinician: Referring Provider: Vicenta Aly Treating Provider/Extender: Tito Dine in Treatment: 4 Diagnosis Coding ICD-10 Codes Code Description E11.621 Type 2 diabetes mellitus with foot ulcer L97.523 Non-pressure chronic ulcer of other part of left foot with necrosis of muscle L97.522 Non-pressure chronic ulcer of other part of left foot with fat layer exposed L02.612 Cutaneous abscess of left foot E11.42 Type 2 diabetes mellitus with diabetic polyneuropathy M14.672 Charcot's joint, left ankle and foot Facility Procedures CPT4 Code: [id number] Description: 99214 - WOUND CARE VISIT-LEV 4 EST PT Modifier: Quantity: 1 Physician Procedures CPT4 Code: [id number] Description: [id number] - WC PHYS LEVEL 4 - EST PT Modifier: Quantity: 1 CPT4 Code: Description: ICD-10 Diagnosis Description E11.621 Type 2 diabetes mellitus with foot ulcer L97.523 Non-pressure chronic ulcer of other part of left foot with necrosis of musc E11.42 Type 2 diabetes mellitus with diabetic polyneuropathy Modifier: le Quantity: Electronic Signature(s) Signed: 06/28/2019 4:54:03 PM By: Linton Ham MD Entered By: Linton Ham on 06/28/2019 11:50:31

## 2019-06-29 NOTE — Progress Notes (Signed)
LEIGHANA, NEYMAN ([id number]) Visit Report for 06/28/2019 Arrival Information Details Patient Name: Debra [name], Debra [name]. Date of Service: 06/28/2019 10:45 AM Medical Record Number: [id number] Patient Account Number: [id number] Date of Birth/Sex: 1945/11/16 (73 y.o. F) Treating RN: Montey Hora Primary Care Ardis Lawley: Vicenta Aly Other Clinician: Referring Sindy Mccune: Vicenta Aly Treating Shyhiem Beeney/Extender: Tito Dine in Treatment: 4 Visit Information History Since Last Visit Added or deleted any medications: No Patient Arrived: Cane Any new allergies or adverse reactions: No Arrival Time: 10:50 Had a fall or experienced change in No Accompanied By: self activities of daily living that may affect Transfer Assistance: None risk of falls: Patient Identification Verified: Yes Signs or symptoms of abuse/neglect since last visito No Secondary Verification Process Completed: Yes Hospitalized since last visit: No Implantable device outside of the clinic excluding No cellular tissue based products placed in the center since last visit: Has Dressing in Place as Prescribed: Yes Pain Present Now: No Electronic Signature(s) Signed: 06/28/2019 4:17:41 PM By: Montey Hora Entered By: Montey Hora on 06/28/2019 10:50:43 Debra [name] ([id number]) -------------------------------------------------------------------------------- Clinic Level of Care Assessment Details Patient Name: Debra [name]. Date of Service: 06/28/2019 10:45 AM Medical Record Number: [id number] Patient Account Number: [id number] Date of Birth/Sex: 1945-08-20 (73 y.o. F) Treating RN: Army Melia Primary Care Alexsander Cavins: Vicenta Aly Other Clinician: Referring Vastie Douty: Vicenta Aly Treating Dequandre Cordova/Extender: Tito Dine in Treatment: 4 Clinic Level of Care Assessment Items TOOL 4 Quantity Score _0  - Use when only an EandM is performed on FOLLOW-UP visit 0 ASSESSMENTS - Nursing  Assessment / Reassessment X - Reassessment of Co-morbidities (includes updates in patient status) 1 10 X- 1 5 Reassessment of Adherence to Treatment Plan ASSESSMENTS - Wound and Skin Assessment / Reassessment _1  - Simple Wound Assessment / Reassessment - one wound 0 X- 2 5 Complex Wound Assessment / Reassessment - multiple wounds _2  - 0 Dermatologic / Skin Assessment (not related to wound area) ASSESSMENTS - Focused Assessment _3  - Circumferential Edema Measurements - multi extremities 0 _4  - 0 Nutritional Assessment / Counseling / Intervention _5  - 0 Lower Extremity Assessment (monofilament, tuning fork, pulses) _6  - 0 Peripheral Arterial Disease Assessment (using hand held doppler) ASSESSMENTS - Ostomy and/or Continence Assessment and Care _7  - Incontinence Assessment and Management 0 _8  - 0 Ostomy Care Assessment and Management (repouching, etc.) PROCESS - Coordination of Care X - Simple Patient / Family Education for ongoing care 1 15 _9  - 0 Complex (extensive) Patient / Family Education for ongoing care X- 1 10 Staff obtains Programmer, systems, Records, Test Results / Process Orders _10  - 0 Staff telephones HHA, Nursing Homes / Clarify orders / etc _11  - 0 Routine Transfer to another Facility (non-emergent condition) _12  - 0 Routine Hospital Admission (non-emergent condition) _13  - 0 New Admissions / Biomedical engineer / Ordering NPWT, Apligraf, etc. _14  - 0 Emergency Hospital Admission (emergent condition) X- 1 10 Simple Discharge Coordination _15  - 0 Complex (extensive) Discharge Coordination PROCESS - Special Needs _16  - Pediatric / Minor Patient Management 0 _17  - 0 Isolation Patient Management _18  - 0 Hearing / Language / Visual special needs _19  - 0 Assessment of Community assistance (transportation, D/C planning, etc.) Debra [name], [name] ([id number]) _20  - 0 Additional assistance / Altered mentation _21  - 0 Support Surface(s) Assessment (bed, cushion, seat,  etc.) INTERVENTIONS - Wound Cleansing / Measurement _22  - Simple Wound Cleansing - one wound 0 X- 2 5 Complex Wound Cleansing - multiple wounds X- 1 5 Wound Imaging (photographs -  any number of wounds) _0  - 0 Wound Tracing (instead of photographs) _1  - 0 Simple Wound Measurement - one wound X- 2 5 Complex Wound Measurement - multiple wounds INTERVENTIONS - Wound Dressings _2  - Small Wound Dressing one or multiple wounds 0 X- 2 15 Medium Wound Dressing one or multiple wounds _3  - 0 Large Wound Dressing one or multiple wounds <IWLNLGXQJJHERDEY>_8<\/XKGYJEHUDJSHFWYO>_3  - 0 Application of Medications - topical <ZCHYIFOYDXAJOINO>_6<\/VEHMCNOBSJGGEZMO>_2  - 0 Application of Medications - injection INTERVENTIONS - Miscellaneous _6  - External ear exam 0 _7  - 0 Specimen Collection (cultures, biopsies, blood, body fluids, etc.) _8  - 0 Specimen(s) / Culture(s) sent or taken to Lab for analysis _9  - 0 Patient Transfer (multiple staff / Civil Service fast streamer / Similar devices) _10  - 0 Simple Staple / Suture removal (25 or less) _11  - 0 Complex Staple / Suture removal (26 or more) _12  - 0 Hypo / Hyperglycemic Management (close monitor of Blood Glucose) _13  - 0 Ankle / Brachial Index (ABI) - do not check if billed separately X- 1 5 Vital Signs Has the patient been seen at the hospital within the last three years: Yes Total Score: 120 Level Of Care: New/Established - Level 4 Electronic Signature(s) Signed: 06/28/2019 11:30:14 AM By: Army Melia Entered By: Army Melia on 06/28/2019 11:10:01 Debra [name] ([id number]) -------------------------------------------------------------------------------- Encounter Discharge Information Details Patient Name: Debra Devon L. Date of Service: 06/28/2019 10:45 AM Medical Record Number: [id number] Patient Account Number: [id number] Date of Birth/Sex: 1946-01-22 (73 y.o. F) Treating RN: Army Melia Primary Care Neita Landrigan: Vicenta Aly Other Clinician: Referring Robby Bulkley: Vicenta Aly Treating Jaliana Medellin/Extender: Tito Dine in Treatment: 4 Encounter Discharge Information Items Discharge Condition: Stable Ambulatory Status: Ambulatory Discharge Destination: Home Transportation: Private Auto Accompanied By: self Schedule Follow-up Appointment: Yes Clinical Summary of Care: Electronic Signature(s) Signed: 06/28/2019 11:30:14 AM By: Army Melia Entered By: Army Melia on 06/28/2019 11:11:07 Debra [name] ([id number]) -------------------------------------------------------------------------------- Lower Extremity Assessment Details Patient Name: Debra [name], [name] Date of Service: 06/28/2019 10:45 AM Medical Record Number: [id number] Patient Account Number: [id number] Date of Birth/Sex: Oct 13, 1945 (73 y.o. F) Treating RN: Montey Hora Primary Care Tarshia Kot: Vicenta Aly Other Clinician: Referring Garin Mata: Vicenta Aly Treating Wilhelmenia Addis/Extender: Tito Dine in Treatment: 4 Edema Assessment Assessed: [Left: No] [Right: No] Edema: [Left: Ye] [Right: s] Calf Left: Right: Point of Measurement: 25 cm From Medial Instep 45 cm cm Ankle Left: Right: Point of Measurement: 13 cm From Medial Instep 29 cm cm Vascular Assessment Pulses: Dorsalis Pedis Palpable: [Left:Yes] Electronic Signature(s) Signed: 06/28/2019 4:17:41 PM By: Montey Hora Entered By: Montey Hora on 06/28/2019 11:00:17 Debra [name] ([id number]) -------------------------------------------------------------------------------- Multi Wound Chart Details Patient Name: Debra Devon L. Date of Service: 06/28/2019 10:45 AM Medical Record Number: [id number] Patient Account Number: [id number] Date of Birth/Sex: 05-Feb-1946 (73 y.o. F) Treating RN: Army Melia Primary Care Tariana Moldovan: Vicenta Aly Other Clinician: Referring Trichelle Lehan: Vicenta Aly Treating Naira Standiford/Extender: Tito Dine in Treatment: 4 Vital Signs Height(in): 64 Pulse(bpm): 90 Weight(lbs): 230 Blood Pressure(mmHg):  154/65 Body Mass Index(BMI): 39 Temperature(F): 97.8 Respiratory Rate(breaths/min): 16 Photos: [N/A:N/A] Wound Location: Left, Plantar Foot Left, Plantar Toe Great N/A Wounding Event: Gradually Appeared Gradually Appeared N/A Primary Etiology: Diabetic Wound/Ulcer of the Lower Diabetic Wound/Ulcer of the Lower N/A Extremity Extremity Comorbid History: Cataracts, Hypertension, Type II Cataracts, Hypertension, Type II N/A Diabetes, Osteoarthritis Diabetes, Osteoarthritis Date Acquired: 05/09/2019 05/09/2019 N/A Weeks of Treatment: 4 4 N/A Wound Status: Open Open N/A Measurements L x W x D (cm) 0.5x0.5x0.3 0.2x0.2x0.2 N/A  Area (cm) : 0.196 0.031 N/A Volume (cm) : 0.059 0.006 N/A % Reduction in Area: 30.70% 84.20% N/A % Reduction in Volume: 92.00% 70.00% N/A Classification: Grade 2 Grade 1 N/A Exudate Amount: Medium Medium N/A Exudate Type: Serosanguineous Serosanguineous N/A Exudate Color: red, brown red, brown N/A Wound Margin: Flat and Intact Flat and Intact N/A Granulation Amount: Large (67-100%) Large (67-100%) N/A Granulation Quality: Pink Red N/A Necrotic Amount: Small (1-33%) None Present (0%) N/A Exposed Structures: Fat Layer (Subcutaneous Tissue) Fat Layer (Subcutaneous Tissue) N/A Exposed: Yes Exposed: Yes Fascia: No Fascia: No Tendon: No Tendon: No Muscle: No Muscle: No Joint: No Joint: No Bone: No Bone: No Epithelialization: None None N/A Treatment Notes Wound #1 (Left, Plantar Foot) Notes silver cell, ABD, conform Debra [name], Debra Sathvika L. ([id number]) Wound #2 (Left, Plantar Toe Great) Notes silver cell, ABD, conform Electronic Signature(s) Signed: 06/28/2019 4:54:03 PM By: Linton Ham MD Entered By: Linton Ham on 06/28/2019 11:12:50 Debra [name] ([id number]) -------------------------------------------------------------------------------- [place] Details Patient Name: Debra [name], [name] Date of Service: 06/28/2019 10:45 AM Medical  Record Number: [id number] Patient Account Number: [id number] Date of Birth/Sex: [birth year] (73 y.o. F) Treating RN: Army Melia Primary Care Hedaya Latendresse: Vicenta Aly Other Clinician: Referring Junior Kenedy: Vicenta Aly Treating Carmencita Cusic/Extender: Tito Dine in Treatment: 4 Active Inactive Necrotic Tissue Nursing Diagnoses: Impaired tissue integrity related to necrotic/devitalized tissue Goals: Necrotic/devitalized tissue will be minimized in the wound bed Date Initiated: 05/31/2019 Target Resolution Date: 06/14/2019 Goal Status: Active Patient/caregiver will verbalize understanding of reason and process for debridement of necrotic tissue Date Initiated: 05/31/2019 Target Resolution Date: 06/14/2019 Goal Status: Active Interventions: Assess patient pain level pre-, during and post procedure and prior to discharge Treatment Activities: Apply topical anesthetic as ordered : 05/31/2019 Excisional debridement : 05/31/2019 Notes: Pain, Acute or Chronic Nursing Diagnoses: Pain, acute or chronic: actual or potential Goals: Patient/caregiver will verbalize comfort level met Date Initiated: 05/31/2019 Target Resolution Date: 06/21/2019 Goal Status: Active Interventions: Assess comfort goal upon admission Treatment Activities: Administer pain control measures as ordered : 05/31/2019 Notes: Peripheral Neuropathy Nursing Diagnoses: Knowledge deficit related to disease process and management of peripheral neurovascular dysfunction Goals: Patient/caregiver will verbalize understanding of disease process and disease management Date Initiated: 05/31/2019 Target Resolution Date: 06/14/2019 Goal Status: Active Debra [name], Debra [name] ([id number]) Interventions: Assess signs and symptoms of neuropathy upon admission and as needed Notes: Wound/Skin Impairment Nursing Diagnoses: Impaired tissue integrity Goals: Patient/caregiver will verbalize understanding of skin care regimen Date  Initiated: 05/31/2019 Target Resolution Date: 06/14/2019 Goal Status: Active Ulcer/skin breakdown will have a volume reduction of 30% by week 4 Date Initiated: 05/31/2019 Target Resolution Date: 07/01/2019 Goal Status: Active Interventions: Assess patient/caregiver ability to obtain necessary supplies Provide education on ulcer and skin care Notes: Electronic Signature(s) Signed: 06/28/2019 11:30:14 AM By: Army Melia Entered By: Army Melia on 06/28/2019 11:05:37 Debra [name] ([id number]) -------------------------------------------------------------------------------- Pain Assessment Details Patient Name: Debra [name], [name] Date of Service: 06/28/2019 10:45 AM Medical Record Number: [id number] Patient Account Number: [id number] Date of Birth/Sex: 01-14-1946 (73 y.o. F) Treating RN: Montey Hora Primary Care Kyndell Zeiser: Vicenta Aly Other Clinician: Referring Mikya Don: Vicenta Aly Treating Kathy Wahid/Extender: Tito Dine in Treatment: 4 Active Problems Location of Pain Severity and Description of Pain Patient Has Paino Yes Site Locations Pain Location: Pain in Ulcers With Dressing Change: Yes Duration of the Pain. Constant / Intermittento Constant Pain Management and Medication Current Pain Management: Electronic Signature(s) Signed: 06/28/2019 4:17:41 PM By: Montey Hora Entered By: Montey Hora on 06/28/2019  10:52:08 JOZELYN, KUWAHARA ([id number]) -------------------------------------------------------------------------------- Patient/Caregiver Education Details Patient Name: NARJIS, MIRA. Date of Service: 06/28/2019 10:45 AM Medical Record Number: [id number] Patient Account Number: [id number] Date of Birth/Gender: 11-Jul-1945 (74 y.o. F) Treating RN: Army Melia Primary Care Physician: Vicenta Aly Other Clinician: Referring Physician: Vicenta Aly Treating Physician/Extender: Tito Dine in Treatment: 4 Education  Assessment Education Provided To: Patient Education Topics Provided Wound/Skin Impairment: Handouts: Caring for Your Ulcer Methods: Demonstration, Explain/Verbal Responses: State content correctly Electronic Signature(s) Signed: 06/28/2019 11:30:14 AM By: Army Melia Entered By: Army Melia on 06/28/2019 11:10:13 Debra [name] ([id number]) -------------------------------------------------------------------------------- Wound Assessment Details Patient Name: Debra [name], [name] Date of Service: 06/28/2019 10:45 AM Medical Record Number: [id number] Patient Account Number: [id number] Date of Birth/Sex: Jul 30, 1945 (73 y.o. F) Treating RN: Montey Hora Primary Care Alyscia Carmon: Vicenta Aly Other Clinician: Referring Marce Schartz: Vicenta Aly Treating Holly Pring/Extender: Tito Dine in Treatment: 4 Wound Status Wound Number: 1 Primary Etiology: Diabetic Wound/Ulcer of the Lower Extremity Wound Location: Left, Plantar Foot Wound Status: Open Wounding Event: Gradually Appeared Comorbid Cataracts, Hypertension, Type II Diabetes, History: Osteoarthritis Date Acquired: 05/09/2019 Weeks Of Treatment: 4 Clustered Wound: No Photos Wound Measurements Length: (cm) 0.5 % R Width: (cm) 0.5 % R Depth: (cm) 0.3 Epi Area: (cm) 0.196 Tu Volume: (cm) 0.059 Un eduction in Area: 30.7% eduction in Volume: 92% thelialization: None nneling: No dermining: No Wound Description Classification: Grade 2 Fou Wound Margin: Flat and Intact Slo Exudate Amount: Medium Exudate Type: Serosanguineous Exudate Color: red, brown l Odor After Cleansing: No ugh/Fibrino Yes Wound Bed Granulation Amount: Large (67-100%) Exposed Structure Granulation Quality: Pink Fascia Exposed: No Necrotic Amount: Small (1-33%) Fat Layer (Subcutaneous Tissue) Exposed: Yes Necrotic Quality: Adherent Slough Tendon Exposed: No Muscle Exposed: No Joint Exposed: No Bone Exposed: No Treatment Notes Wound #1  (Left, Plantar Foot) Notes silver cell, ABD, conform Electronic Signature(s) Signed: 06/28/2019 4:17:41 PM By: Sherre Poot, Estanislado Spire ([id number]) Entered By: Montey Hora on 06/28/2019 10:59:22 Debra [name] ([id number]) -------------------------------------------------------------------------------- Wound Assessment Details Patient Name: Debra [name], [name] Date of Service: 06/28/2019 10:45 AM Medical Record Number: [id number] Patient Account Number: [id number] Date of Birth/Sex: Jul 03, 1945 (73 y.o. F) Treating RN: Montey Hora Primary Care Teena Mangus: Vicenta Aly Other Clinician: Referring Venson Ferencz: Vicenta Aly Treating Larnce Schnackenberg/Extender: Tito Dine in Treatment: 4 Wound Status Wound Number: 2 Primary Etiology: Diabetic Wound/Ulcer of the Lower Extremity Wound Location: Left, Plantar Toe Great Wound Status: Open Wounding Event: Gradually Appeared Comorbid Cataracts, Hypertension, Type II Diabetes, History: Osteoarthritis Date Acquired: 05/09/2019 Weeks Of Treatment: 4 Clustered Wound: No Photos Wound Measurements Length: (cm) 0.2 % Width: (cm) 0.2 % Depth: (cm) 0.2 E Area: (cm) 0.031 Volume: (cm) 0.006 Reduction in Area: 84.2% Reduction in Volume: 70% pithelialization: None Tunneling: No Undermining: No Wound Description Classification: Grade 1 F Wound Margin: Flat and Intact S Exudate Amount: Medium Exudate Type: Serosanguineous Exudate Color: red, brown oul Odor After Cleansing: No lough/Fibrino Yes Wound Bed Granulation Amount: Large (67-100%) Exposed Structure Granulation Quality: Red Fascia Exposed: No Necrotic Amount: None Present (0%) Fat Layer (Subcutaneous Tissue) Exposed: Yes Tendon Exposed: No Muscle Exposed: No Joint Exposed: No Bone Exposed: No Treatment Notes Wound #2 (Left, Plantar Toe Great) Notes silver cell, ABD, conform Electronic Signature(s) Signed: 06/28/2019 4:17:41 PM By: Sherre Poot, Estanislado Spire ([id number]) Entered By: Montey Hora on 06/28/2019 10:59:49 Debra [name] ([id number]) -------------------------------------------------------------------------------- Vitals Details Patient Name: Debra Devon L. Date of Service: 06/28/2019 10:45 AM Medical Record Number: [id number]  Patient Account Number: [id number] Date of Birth/Sex: Mar 11, 1946 (74 y.o. F) Treating RN: Montey Hora Primary Care Haruka Kowaleski: Vicenta Aly Other Clinician: Referring Gurnie Duris: Vicenta Aly Treating Aaban Griep/Extender: Tito Dine in Treatment: 4 Vital Signs Time Taken: 10:54 Temperature (F): 97.8 Height (in): 64 Pulse (bpm): 90 Weight (lbs): 230 Respiratory Rate (breaths/min): 16 Body Mass Index (BMI): 39.5 Blood Pressure (mmHg): 154/65 Reference Range: 80 - 120 mg / dl Electronic Signature(s) Signed: 06/28/2019 4:17:41 PM By: Montey Hora Entered By: Montey Hora on 06/28/2019 10:54:20

## 2019-07-05 ENCOUNTER — Other Ambulatory Visit: Payer: Self-pay

## 2019-07-05 ENCOUNTER — Encounter: Payer: Medicare Other | Admitting: Internal Medicine

## 2019-07-05 DIAGNOSIS — E11621 Type 2 diabetes mellitus with foot ulcer: Secondary | ICD-10-CM | POA: Diagnosis not present

## 2019-07-05 NOTE — Progress Notes (Signed)
Debra, [name] ([id number]) Visit Report for 06/14/2019 Arrival Information Details Patient Name: Debra [name], Debra [name]. Date of Service: 06/14/2019 10:45 AM Medical Record Number: [id number] Patient Account Number: [id number] Date of Birth/Sex: 05/09/1945 (74 y.o. F) Treating RN: Cornell Barman Primary Care Merary Garguilo: Vicenta Aly Other Clinician: Referring Vear Staton: Vicenta Aly Treating Embrie Mikkelsen/Extender: Tito Dine in Treatment: 2 Visit Information History Since Last Visit Added or deleted any medications: No Patient Arrived: Ambulatory Any new allergies or adverse reactions: No Arrival Time: 10:45 Had a fall or experienced change in No Accompanied By: self activities of daily living that may affect Transfer Assistance: None risk of falls: Patient Identification Verified: Yes Signs or symptoms of abuse/neglect since last visito No Secondary Verification Process Completed: Yes Hospitalized since last visit: No Implantable device outside of the clinic excluding No cellular tissue based products placed in the center since last visit: Pain Present Now: No Electronic Signature(s) Signed: 06/14/2019 4:05:17 PM By: Lorine Bears RCP, RRT, CHT Entered By: Lorine Bears on 06/14/2019 10:47:30 [name] ([id number]) -------------------------------------------------------------------------------- Encounter Discharge Information Details Patient Name: Debra, [name] Date of Service: 06/14/2019 10:45 AM Medical Record Number: [id number] Patient Account Number: [id number] Date of Birth/Sex: Apr 16, 1945 (74 y.o. F) Treating RN: Cornell Barman Primary Care Bronda Alfred: Vicenta Aly Other Clinician: Referring Tiron Suski: Vicenta Aly Treating Ayleah Hofmeister/Extender: Tito Dine in Treatment: 2 Encounter Discharge Information Items Post Procedure Vitals Discharge Condition: Stable Temperature (F): 98.5 Ambulatory Status: Cane Pulse  (bpm): 80 Discharge Destination: Home Respiratory Rate (breaths/min): 16 Transportation: Private Auto Blood Pressure (mmHg): 145/62 Accompanied By: self Schedule Follow-up Appointment: Yes Clinical Summary of Care: Electronic Signature(s) Signed: 07/05/2019 11:11:47 AM By: Gretta Cool, BSN, RN, CWS, Kim RN, BSN Entered By: Gretta Cool, BSN, RN, CWS, Kim on 06/14/2019 11:11:49 [name] ([id number]) -------------------------------------------------------------------------------- Lower Extremity Assessment Details Patient Name: Debra, [name] Date of Service: 06/14/2019 10:45 AM Medical Record Number: [id number] Patient Account Number: [id number] Date of Birth/Sex: September 07, 1945 (74 y.o. F) Treating RN: Montey Hora Primary Care Leevi Cullars: Vicenta Aly Other Clinician: Referring Cortlan Dolin: Vicenta Aly Treating Artha Stavros/Extender: Ricard Dillon Weeks in Treatment: 2 Edema Assessment Assessed: [Left: No] [Right: No] Edema: [Left: Ye] [Right: s] Vascular Assessment Pulses: Dorsalis Pedis Palpable: [Left:Yes] Electronic Signature(s) Signed: 06/14/2019 4:33:06 PM By: Montey Hora Entered By: Montey Hora on 06/14/2019 10:54:27 [place], Estanislado Spire ([id number]) -------------------------------------------------------------------------------- Multi Wound Chart Details Patient Name: [name], [name] Date of Service: 06/14/2019 10:45 AM Medical Record Number: [id number] Patient Account Number: [id number] Date of Birth/Sex: [birth year] (74 y.o. F) Treating RN: Cornell Barman Primary Care Brysun Eschmann: Vicenta Aly Other Clinician: Referring Evelynn Hench: Vicenta Aly Treating Shanielle Correll/Extender: Tito Dine in Treatment: 2 Vital Signs Height(in): 83 Pulse(bpm): 80 Weight(lbs): 230 Blood Pressure(mmHg): 145/62 Body Mass Index(BMI): 39 Temperature(F): 98.0 Respiratory Rate(breaths/min): 16 Photos: [1:No Photos] [2:No Photos] [N/A:N/A] Wound Location: [1:Left Foot - Plantar]  [2:Left Toe Great - Plantar] [N/A:N/A] Wounding Event: [1:Gradually Appeared] [2:Gradually Appeared] [N/A:N/A] Primary Etiology: [1:Diabetic Wound/Ulcer of the Lower Extremity] [2:Diabetic Wound/Ulcer of the Lower Extremity] [N/A:N/A] Comorbid History: [1:Cataracts, Hypertension, Type II Diabetes, Osteoarthritis] [2:Cataracts, Hypertension, Type II Diabetes, Osteoarthritis] [N/A:N/A] Date Acquired: [1:05/09/2019] [2:05/09/2019] [N/A:N/A] Weeks of Treatment: [1:2] [2:2] [N/A:N/A] Wound Status: [1:Open] [2:Open] [N/A:N/A] Measurements L x W x D (cm) [1:0.7x0.8x1.9] [2:0.2x0.2x0.2] [N/A:N/A] Area (cm) : [1:0.44] [2:0.031] [N/A:N/A] Volume (cm) : [1:0.836] [2:0.006] [N/A:N/A] % Reduction in Area: [1:-55.50%] [2:84.20%] [N/A:N/A] % Reduction in Volume: [1:-13.70%] [2:70.00%] [N/A:N/A] Classification: [1:Grade 2] [2:Grade 1] [N/A:N/A] Exudate Amount: [1:Medium] [2:Medium] [N/A:N/A] Exudate Type: [1:Serosanguineous] [  2:Serosanguineous] [N/A:N/A] Exudate Color: [1:red, brown] [2:red, brown] [N/A:N/A] Foul Odor After Cleansing: [1:Yes] [2:No] [N/A:N/A] Odor Anticipated Due to Product [1:No] [2:N/A] [N/A:N/A] Use: Wound Margin: [1:Flat and Intact] [2:Flat and Intact] [N/A:N/A] Granulation Amount: [1:Medium (34-66%)] [2:Large (67-100%)] [N/A:N/A] Granulation Quality: [1:Pink] [2:Pink] [N/A:N/A] Necrotic Amount: [1:Medium (34-66%)] [2:Small (1-33%)] [N/A:N/A] Exposed Structures: [1:Fat Layer (Subcutaneous Tissue) Exposed: Yes Fascia: No Tendon: No Muscle: No Joint: No Bone: No] [2:Fat Layer (Subcutaneous Tissue) Exposed: Yes Fascia: No Tendon: No Muscle: No Joint: No Bone: No] [N/A:N/A] Epithelialization: [1:None] [2:None] [N/A:N/A] Debridement: [1:Debridement - Selective/Open Wound] [2:N/A] [N/A:N/A] Pre-procedure Verification/Time [1:11:02] [2:N/A] [N/A:N/A] Out Taken: Pain Control: [1:Lidocaine] [2:N/A] [N/A:N/A] Tissue Debrided: [1:Slough] [2:N/A] [N/A:N/A] Level: [1:Skin/Dermis] [2:N/A]  [N/A:N/A] Debridement Area (sq cm): [1:0.56] [2:N/A] [N/A:N/A] Instrument: [1:Blade, Forceps] [2:N/A] [N/A:N/A] Bleeding: [1:Minimum] [2:N/A] [N/A:N/A] Hemostasis Achieved: [1:Pressure] [2:N/A] [N/A:N/A] Debridement Treatment [1:Procedure was tolerated well] [2:N/A] [N/A:N/A] Response: [1:0.7x0.8x1.9] [2:N/A] [N/A:N/A] Post Debridement Measurements L x W x D (cm) Post Debridement Volume: (cm) 0.836 N/A N/A Procedures Performed: Debridement N/A N/A Treatment Notes Wound #1 (Left, Plantar Foot) 1. Cleansed with: Clean wound with Normal Saline 2. Anesthetic Topical Lidocaine 4% cream to wound bed prior to debridement 4. Dressing Applied: Other dressing (specify in notes) 5. Secondary Dressing Applied ABD and Kerlix/Conform 6. Footwear/Offloading device applied Wedge shoe 7. Secured with Tape Notes Silver cell Wound #2 (Left, Plantar Toe Great) 1. Cleansed with: Clean wound with Normal Saline 2. Anesthetic Topical Lidocaine 4% cream to wound bed prior to debridement 4. Dressing Applied: Other dressing (specify in notes) 5. Secondary Dressing Applied ABD and Kerlix/Conform 6. Footwear/Offloading device applied Wedge shoe 7. Secured with Tape Notes Silver cell Electronic Signature(s) Signed: 06/15/2019 4:13:18 AM By: Linton Ham MD Entered By: Linton Ham on 06/14/2019 11:13:49 [name] ([id number]) -------------------------------------------------------------------------------- [place] Details Patient Name: KENNESHA, BREWBAKER. Date of Service: 06/14/2019 10:45 AM Medical Record Number: [id number] Patient Account Number: [id number] Date of Birth/Sex: 05-13-1945 (74 y.o. F) Treating RN: Cornell Barman Primary Care Parrish Daddario: Vicenta Aly Other Clinician: Referring Abdulahad Mederos: Vicenta Aly Treating Anyla Israelson/Extender: Tito Dine in Treatment: 2 Active Inactive Necrotic Tissue Nursing Diagnoses: Impaired tissue integrity  related to necrotic/devitalized tissue Goals: Necrotic/devitalized tissue will be minimized in the wound bed Date Initiated: 05/31/2019 Target Resolution Date: 06/14/2019 Goal Status: Active Patient/caregiver will verbalize understanding of reason and process for debridement of necrotic tissue Date Initiated: 05/31/2019 Target Resolution Date: 06/14/2019 Goal Status: Active Interventions: Assess patient pain level pre-, during and post procedure and prior to discharge Treatment Activities: Apply topical anesthetic as ordered : 05/31/2019 Excisional debridement : 05/31/2019 Notes: Pain, Acute or Chronic Nursing Diagnoses: Pain, acute or chronic: actual or potential Goals: Patient/caregiver will verbalize comfort level met Date Initiated: 05/31/2019 Target Resolution Date: 06/21/2019 Goal Status: Active Interventions: Assess comfort goal upon admission Treatment Activities: Administer pain control measures as ordered : 05/31/2019 Notes: Peripheral Neuropathy Nursing Diagnoses: Knowledge deficit related to disease process and management of peripheral neurovascular dysfunction Goals: Patient/caregiver will verbalize understanding of disease process and disease management Date Initiated: 05/31/2019 Target Resolution Date: 06/14/2019 Goal Status: Active ANELIS, HRIVNAK ([id number]) Interventions: Assess signs and symptoms of neuropathy upon admission and as needed Notes: Wound/Skin Impairment Nursing Diagnoses: Impaired tissue integrity Goals: Patient/caregiver will verbalize understanding of skin care regimen Date Initiated: 05/31/2019 Target Resolution Date: 06/14/2019 Goal Status: Active Ulcer/skin breakdown will have a volume reduction of 30% by week 4 Date Initiated: 05/31/2019 Target Resolution Date: 07/01/2019 Goal Status: Active Interventions: Assess patient/caregiver ability to obtain  necessary supplies Provide education on ulcer and skin care Notes: Electronic  Signature(s) Signed: 07/05/2019 11:11:47 AM By: Gretta Cool, BSN, RN, CWS, Kim RN, BSN Entered By: Gretta Cool, BSN, RN, CWS, Kim on 06/14/2019 11:01:18 [name] ([id number]) -------------------------------------------------------------------------------- Pain Assessment Details Patient Name: MINNAH, LLAMAS L. Date of Service: 06/14/2019 10:45 AM Medical Record Number: [id number] Patient Account Number: [id number] Date of Birth/Sex: 1945/07/22 (74 y.o. F) Treating RN: Montey Hora Primary Care Lenon Kuennen: Vicenta Aly Other Clinician: Referring Trista Ciocca: Vicenta Aly Treating Nargis Abrams/Extender: Tito Dine in Treatment: 2 Active Problems Location of Pain Severity and Description of Pain Patient Has Paino Yes Site Locations Pain Location: Pain in Ulcers With Dressing Change: Yes Duration of the Pain. Constant / Intermittento Constant Rate the pain. Current Pain Level: 1 Character of Pain Describe the Pain: Aching Pain Management and Medication Current Pain Management: Electronic Signature(s) Signed: 06/14/2019 4:33:06 PM By: Montey Hora Entered By: Montey Hora on 06/14/2019 10:49:58 [name] ([id number]) -------------------------------------------------------------------------------- Patient/Caregiver Education Details Patient Name: NYIMA, VANACKER L. Date of Service: 06/14/2019 10:45 AM Medical Record Number: [id number] Patient Account Number: [id number] Date of Birth/Gender: 1945/04/11 (74 y.o. F) Treating RN: Cornell Barman Primary Care Physician: Vicenta Aly Other Clinician: Referring Physician: Vicenta Aly Treating Physician/Extender: Tito Dine in Treatment: 2 Education Assessment Education Provided To: Patient Education Topics Provided Wound Debridement: Handouts: Wound Debridement Methods: Demonstration, Explain/Verbal Responses: Return demonstration correctly Wound/Skin Impairment: Handouts: Caring for Your  Ulcer Methods: Demonstration, Explain/Verbal Responses: State content correctly Electronic Signature(s) Signed: 07/05/2019 11:11:47 AM By: Gretta Cool, BSN, RN, CWS, Kim RN, BSN Entered By: Gretta Cool, BSN, RN, CWS, Kim on 06/14/2019 11:10:34 [name] ([id number]) -------------------------------------------------------------------------------- Wound Assessment Details Patient Name: TIANN, SAHA L. Date of Service: 06/14/2019 10:45 AM Medical Record Number: [id number] Patient Account Number: [id number] Date of Birth/Sex: 06/01/1945 (74 y.o. F) Treating RN: Montey Hora Primary Care Salbador Fiveash: Vicenta Aly Other Clinician: Referring Sherry Rogus: Vicenta Aly Treating Crewe Heathman/Extender: Tito Dine in Treatment: 2 Wound Status Wound Number: 1 Primary Etiology: Diabetic Wound/Ulcer of the Lower Extremity Wound Location: Left Foot - Plantar Wound Status: Open Wounding Event: Gradually Appeared Comorbid Cataracts, Hypertension, Type II Diabetes, History: Osteoarthritis Date Acquired: 05/09/2019 Weeks Of Treatment: 2 Clustered Wound: No Wound Measurements Length: (cm) 0.7 Width: (cm) 0.8 Depth: (cm) 1.9 Area: (cm) 0.44 Volume: (cm) 0.836 % Reduction in Area: -55.5% % Reduction in Volume: -13.7% Epithelialization: None Tunneling: No Undermining: No Wound Description Classification: Grade 2 Wound Margin: Flat and Intact Exudate Amount: Medium Exudate Type: Serosanguineous Exudate Color: red, brown Foul Odor After Cleansing: Yes Due to Product Use: No Slough/Fibrino Yes Wound Bed Granulation Amount: Medium (34-66%) Exposed Structure Granulation Quality: Pink Fascia Exposed: No Necrotic Amount: Medium (34-66%) Fat Layer (Subcutaneous Tissue) Exposed: Yes Necrotic Quality: Adherent Slough Tendon Exposed: No Muscle Exposed: No Joint Exposed: No Bone Exposed: No Electronic Signature(s) Signed: 06/14/2019 4:33:06 PM By: Montey Hora Entered By: Montey Hora on 06/14/2019 10:53:11 [name] ([id number]) -------------------------------------------------------------------------------- Wound Assessment Details Patient Name: MEDITZ, Treina L. Date of Service: 06/14/2019 10:45 AM Medical Record Number: [id number] Patient Account Number: [id number] Date of Birth/Sex: 1945/08/02 (74 y.o. F) Treating RN: Montey Hora Primary Care Inola Lisle: Vicenta Aly Other Clinician: Referring Vibha Ferdig: Vicenta Aly Treating Dickie Cloe/Extender: Tito Dine in Treatment: 2 Wound Status Wound Number: 2 Primary Etiology: Diabetic Wound/Ulcer of the Lower Extremity Wound Location: Left Toe Great - Plantar Wound Status: Open Wounding Event: Gradually Appeared Comorbid Cataracts, Hypertension, Type II Diabetes, History: Osteoarthritis Date Acquired:  05/09/2019 Weeks Of Treatment: 2 Clustered Wound: No Wound Measurements Length: (cm) 0.2 Width: (cm) 0.2 Depth: (cm) 0.2 Area: (cm) 0.031 Volume: (cm) 0.006 % Reduction in Area: 84.2% % Reduction in Volume: 70% Epithelialization: None Tunneling: No Undermining: No Wound Description Classification: Grade 1 Wound Margin: Flat and Intact Exudate Amount: Medium Exudate Type: Serosanguineous Exudate Color: red, brown Foul Odor After Cleansing: No Slough/Fibrino Yes Wound Bed Granulation Amount: Large (67-100%) Exposed Structure Granulation Quality: Pink Fascia Exposed: No Necrotic Amount: Small (1-33%) Fat Layer (Subcutaneous Tissue) Exposed: Yes Necrotic Quality: Adherent Slough Tendon Exposed: No Muscle Exposed: No Joint Exposed: No Bone Exposed: No Electronic Signature(s) Signed: 06/14/2019 4:33:06 PM By: Montey Hora Entered By: Montey Hora on 06/14/2019 10:53:31 [name] ([id number]) -------------------------------------------------------------------------------- [place] Details Patient Name: [name] Date of Service: 06/14/2019 10:45 AM Medical  Record Number: [id number] Patient Account Number: [id number] Date of Birth/Sex: 01-27-1946 (74 y.o. F) Treating RN: Cornell Barman Primary Care Abner Ardis: Vicenta Aly Other Clinician: Referring Braddock Servellon: Vicenta Aly Treating Tyeler Goedken/Extender: Tito Dine in Treatment: 2 Vital Signs Time Taken: 10:45 Temperature (F): 98.0 Height (in): 64 Pulse (bpm): 80 Weight (lbs): 230 Respiratory Rate (breaths/min): 16 Body Mass Index (BMI): 39.5 Blood Pressure (mmHg): 145/62 Reference Range: 80 - 120 mg / dl Electronic Signature(s) Signed: 06/14/2019 4:05:17 PM By: Lorine Bears RCP, RRT, CHT Entered By: Lorine Bears on 06/14/2019 10:48:25

## 2019-07-05 NOTE — Progress Notes (Signed)
Debra [name] ([id number]) Visit Report for 07/05/2019 Arrival Information Details Patient Name: Debra [name], Debra [name]. Date of Service: 07/05/2019 10:45 AM Medical Record Number: [id number] Patient Account Number: [id number] Date of Birth/Sex: [birth year] (74 y.o. F) Treating RN: Cornell Barman Primary Care Javana Schey: Vicenta Aly Other Clinician: Referring Metzli Pollick: Vicenta Aly Treating Odeth Bry/Extender: Tito Dine in Treatment: 5 Visit Information History Since Last Visit Added or deleted any medications: No Patient Arrived: Kasandra Knudsen Any new allergies or adverse reactions: No Arrival Time: 10:33 Had a fall or experienced change in No Accompanied By: self activities of daily living that may affect Transfer Assistance: None risk of falls: Patient Identification Verified: Yes Signs or symptoms of abuse/neglect since last visito No Secondary Verification Process Completed: Yes Hospitalized since last visit: No Implantable device outside of the clinic excluding No cellular tissue based products placed in the center since last visit: Has Dressing in Place as Prescribed: Yes Pain Present Now: No Electronic Signature(s) Signed: 07/05/2019 3:16:46 PM By: Lorine Bears RCP, RRT, CHT Entered By: Lorine Bears on 07/05/2019 10:36:00 [name] ([id number]) -------------------------------------------------------------------------------- Clinic Level of Care Assessment Details Patient Name: [name], [name] Date of Service: 07/05/2019 10:45 AM Medical Record Number: [id number] Patient Account Number: [id number] Date of Birth/Sex: 1945/12/23 (74 y.o. F) Treating RN: Cornell Barman Primary Care Harjot Dibello: Vicenta Aly Other Clinician: Referring Nyesha Cliff: Vicenta Aly Treating Eilam Shrewsbury/Extender: Tito Dine in Treatment: 5 Clinic Level of Care Assessment Items TOOL 4 Quantity Score '[]'  - Use when only an EandM is performed on  FOLLOW-UP visit 0 ASSESSMENTS - Nursing Assessment / Reassessment X - Reassessment of Co-morbidities (includes updates in patient status) 1 10 X- 1 5 Reassessment of Adherence to Treatment Plan ASSESSMENTS - Wound and Skin Assessment / Reassessment '[]'  - Simple Wound Assessment / Reassessment - one wound 0 X- 2 5 Complex Wound Assessment / Reassessment - multiple wounds '[]'  - 0 Dermatologic / Skin Assessment (not related to wound area) ASSESSMENTS - Focused Assessment '[]'  - Circumferential Edema Measurements - multi extremities 0 '[]'  - 0 Nutritional Assessment / Counseling / Intervention '[]'  - 0 Lower Extremity Assessment (monofilament, tuning fork, pulses) '[]'  - 0 Peripheral Arterial Disease Assessment (using hand held doppler) ASSESSMENTS - Ostomy and/or Continence Assessment and Care '[]'  - Incontinence Assessment and Management 0 '[]'  - 0 Ostomy Care Assessment and Management (repouching, etc.) PROCESS - Coordination of Care X - Simple Patient / Family Education for ongoing care 1 15 '[]'  - 0 Complex (extensive) Patient / Family Education for ongoing care '[]'  - 0 Staff obtains Programmer, systems, Records, Test Results / Process Orders '[]'  - 0 Staff telephones HHA, Nursing Homes / Clarify orders / etc '[]'  - 0 Routine Transfer to another Facility (non-emergent condition) '[]'  - 0 Routine Hospital Admission (non-emergent condition) '[]'  - 0 New Admissions / Biomedical engineer / Ordering NPWT, Apligraf, etc. '[]'  - 0 Emergency Hospital Admission (emergent condition) X- 1 10 Simple Discharge Coordination '[]'  - 0 Complex (extensive) Discharge Coordination PROCESS - Special Needs '[]'  - Pediatric / Minor Patient Management 0 '[]'  - 0 Isolation Patient Management '[]'  - 0 Hearing / Language / Visual special needs '[]'  - 0 Assessment of Community assistance (transportation, D/C planning, etc.) KARRISSA, PARCHMENT. ([id number]) '[]'  - 0 Additional assistance / Altered mentation '[]'  - 0 Support Surface(s)  Assessment (bed, cushion, seat, etc.) INTERVENTIONS - Wound Cleansing / Measurement '[]'  - Simple Wound Cleansing - one wound 0 X- 2 5 Complex Wound Cleansing - multiple wounds X- 1  5 Wound Imaging (photographs - any number of wounds) '[]'  - 0 Wound Tracing (instead of photographs) '[]'  - 0 Simple Wound Measurement - one wound X- 2 5 Complex Wound Measurement - multiple wounds INTERVENTIONS - Wound Dressings X - Small Wound Dressing one or multiple wounds 1 10 X- 1 15 Medium Wound Dressing one or multiple wounds '[]'  - 0 Large Wound Dressing one or multiple wounds '[]'  - 0 Application of Medications - topical '[]'  - 0 Application of Medications - injection INTERVENTIONS - Miscellaneous '[]'  - External ear exam 0 '[]'  - 0 Specimen Collection (cultures, biopsies, blood, body fluids, etc.) '[]'  - 0 Specimen(s) / Culture(s) sent or taken to Lab for analysis '[]'  - 0 Patient Transfer (multiple staff / Civil Service fast streamer / Similar devices) '[]'  - 0 Simple Staple / Suture removal (25 or less) '[]'  - 0 Complex Staple / Suture removal (26 or more) '[]'  - 0 Hypo / Hyperglycemic Management (close monitor of Blood Glucose) '[]'  - 0 Ankle / Brachial Index (ABI) - do not check if billed separately X- 1 5 Vital Signs Has the patient been seen at the hospital within the last three years: Yes Total Score: 105 Level Of Care: New/Established - Level 3 Electronic Signature(s) Signed: 07/05/2019 11:11:47 AM By: Gretta Cool, BSN, RN, CWS, Kim RN, BSN Entered By: Gretta Cool, BSN, RN, CWS, Kim on 07/05/2019 11:01:00 [name] ([id number]) -------------------------------------------------------------------------------- Encounter Discharge Information Details Patient Name: AVORY, [name] Date of Service: 07/05/2019 10:45 AM Medical Record Number: [id number] Patient Account Number: [id number] Date of Birth/Sex: 1945-11-06 (74 y.o. F) Treating RN: Cornell Barman Primary Care Edin Kon: Vicenta Aly Other Clinician: Referring  Rayleigh Gillyard: Vicenta Aly Treating Carlina Derks/Extender: Tito Dine in Treatment: 5 Encounter Discharge Information Items Discharge Condition: Stable Ambulatory Status: Cane Discharge Destination: Home Transportation: Private Auto Accompanied By: self Schedule Follow-up Appointment: Yes Clinical Summary of Care: Electronic Signature(s) Signed: 07/05/2019 11:11:47 AM By: Gretta Cool, BSN, RN, CWS, Kim RN, BSN Entered By: Gretta Cool, BSN, RN, CWS, Kim on 07/05/2019 11:02:50 [name] ([id number]) -------------------------------------------------------------------------------- Lower Extremity Assessment Details Patient Name: MIKAELYN, ARTHURS L. Date of Service: 07/05/2019 10:45 AM Medical Record Number: [id number] Patient Account Number: [id number] Date of Birth/Sex: Sep 25, 1945 (73 y.o. F) Treating RN: Army Melia Primary Care Weylin Plagge: Vicenta Aly Other Clinician: Referring Dorlis Judice: Vicenta Aly Treating Chancellor Vanderloop/Extender: Ricard Dillon Weeks in Treatment: 5 Edema Assessment Assessed: [Left: No] [Right: No] Edema: [Left: N] [Right: o] Vascular Assessment Pulses: Dorsalis Pedis Palpable: [Left:Yes] Electronic Signature(s) Signed: 07/05/2019 11:17:39 AM By: Army Melia Entered By: Army Melia on 07/05/2019 10:46:31 [name] ([id number]) -------------------------------------------------------------------------------- Multi Wound Chart Details Patient Name: [name] Date of Service: 07/05/2019 10:45 AM Medical Record Number: [id number] Patient Account Number: [id number] Date of Birth/Sex: 09/14/1945 (73 y.o. F) Treating RN: Cornell Barman Primary Care Jonica Bickhart: Vicenta Aly Other Clinician: Referring Seydina Holliman: Vicenta Aly Treating Kaile Bixler/Extender: Tito Dine in Treatment: 5 Vital Signs Height(in): 64 Pulse(bpm): 47 Weight(lbs): 230 Blood Pressure(mmHg): 134/60 Body Mass Index(BMI): 39 Temperature(F): 98.2 Respiratory  Rate(breaths/min): 18 Photos: [N/A:N/A] Wound Location: Left, Plantar Foot Left, Plantar Toe Great N/A Wounding Event: Gradually Appeared Gradually Appeared N/A Primary Etiology: Diabetic Wound/Ulcer of the Lower Diabetic Wound/Ulcer of the Lower N/A Extremity Extremity Comorbid History: Cataracts, Hypertension, Type II Cataracts, Hypertension, Type II N/A Diabetes, Osteoarthritis Diabetes, Osteoarthritis Date Acquired: 05/09/2019 05/09/2019 N/A Weeks of Treatment: 5 5 N/A Wound Status: Open Open N/A Measurements L x W x D (cm) 0.4x0.5x2 0.2x0.2x0.2 N/A Area (cm) : 0.157 0.031 N/A  Volume (cm) : 0.314 0.006 N/A % Reduction in Area: 44.50% 84.20% N/A % Reduction in Volume: 57.30% 70.00% N/A Classification: Grade 2 Grade 1 N/A Exudate Amount: Medium Medium N/A Exudate Type: Serosanguineous Serosanguineous N/A Exudate Color: red, brown red, brown N/A Wound Margin: Flat and Intact Flat and Intact N/A Granulation Amount: Large (67-100%) Large (67-100%) N/A Granulation Quality: Pink Red N/A Necrotic Amount: Small (1-33%) None Present (0%) N/A Exposed Structures: Fat Layer (Subcutaneous Tissue) Fat Layer (Subcutaneous Tissue) N/A Exposed: Yes Exposed: Yes Fascia: No Fascia: No Tendon: No Tendon: No Muscle: No Muscle: No Joint: No Joint: No Bone: No Bone: No Epithelialization: None None N/A Treatment Notes Electronic Signature(s) Signed: 07/05/2019 11:11:47 AM By: Gretta Cool, BSN, RN, CWS, Kim RN, BSN Entered By: Gretta Cool, BSN, RN, CWS, Kim on 07/05/2019 10:54:48 [name] ([id number]) OTTIS, SARNOWSKI ([id number]) -------------------------------------------------------------------------------- [place] Details Patient Name: KERIE, BADGER L. Date of Service: 07/05/2019 10:45 AM Medical Record Number: [id number] Patient Account Number: [id number] Date of Birth/Sex: 1945-11-18 (73 y.o. F) Treating RN: Cornell Barman Primary Care Lumi Winslett: Vicenta Aly Other  Clinician: Referring Danilynn Jemison: Vicenta Aly Treating Christain Mcraney/Extender: Tito Dine in Treatment: 5 Active Inactive Necrotic Tissue Nursing Diagnoses: Impaired tissue integrity related to necrotic/devitalized tissue Goals: Necrotic/devitalized tissue will be minimized in the wound bed Date Initiated: 05/31/2019 Target Resolution Date: 06/14/2019 Goal Status: Active Patient/caregiver will verbalize understanding of reason and process for debridement of necrotic tissue Date Initiated: 05/31/2019 Target Resolution Date: 06/14/2019 Goal Status: Active Interventions: Assess patient pain level pre-, during and post procedure and prior to discharge Treatment Activities: Apply topical anesthetic as ordered : 05/31/2019 Excisional debridement : 05/31/2019 Notes: Pain, Acute or Chronic Nursing Diagnoses: Pain, acute or chronic: actual or potential Goals: Patient/caregiver will verbalize comfort level met Date Initiated: 05/31/2019 Target Resolution Date: 06/21/2019 Goal Status: Active Interventions: Assess comfort goal upon admission Treatment Activities: Administer pain control measures as ordered : 05/31/2019 Notes: Peripheral Neuropathy Nursing Diagnoses: Knowledge deficit related to disease process and management of peripheral neurovascular dysfunction Goals: Patient/caregiver will verbalize understanding of disease process and disease management Date Initiated: 05/31/2019 Target Resolution Date: 06/14/2019 Goal Status: Active OLETA, GUNNOE ([id number]) Interventions: Assess signs and symptoms of neuropathy upon admission and as needed Notes: Wound/Skin Impairment Nursing Diagnoses: Impaired tissue integrity Goals: Patient/caregiver will verbalize understanding of skin care regimen Date Initiated: 05/31/2019 Target Resolution Date: 06/14/2019 Goal Status: Active Ulcer/skin breakdown will have a volume reduction of 30% by week 4 Date Initiated: 05/31/2019 Target  Resolution Date: 07/01/2019 Goal Status: Active Interventions: Assess patient/caregiver ability to obtain necessary supplies Provide education on ulcer and skin care Notes: Electronic Signature(s) Signed: 07/05/2019 11:11:47 AM By: Gretta Cool, BSN, RN, CWS, Kim RN, BSN Entered By: Gretta Cool, BSN, RN, CWS, Kim on 07/05/2019 10:54:40 [name] ([id number]) -------------------------------------------------------------------------------- Pain Assessment Details Patient Name: LIANY, MUMPOWER L. Date of Service: 07/05/2019 10:45 AM Medical Record Number: [id number] Patient Account Number: [id number] Date of Birth/Sex: 02-28-1946 (73 y.o. F) Treating RN: Army Melia Primary Care Carigan Lister: Vicenta Aly Other Clinician: Referring Parth Mccormac: Vicenta Aly Treating Chene Kasinger/Extender: Tito Dine in Treatment: 5 Active Problems Location of Pain Severity and Description of Pain Patient Has Paino Yes Site Locations Pain Location: Pain in Ulcers Rate the pain. Current Pain Level: 2 Pain Management and Medication Current Pain Management: Electronic Signature(s) Signed: 07/05/2019 11:17:39 AM By: Army Melia Entered By: Army Melia on 07/05/2019 10:43:58 [name] ([id number]) -------------------------------------------------------------------------------- Patient/Caregiver Education Details Patient Name: [name] Date of Service:  07/05/2019 10:45 AM Medical Record Number: [id number] Patient Account Number: [id number] Date of Birth/Gender: November 18, 1945 (73 y.o. F) Treating RN: Cornell Barman Primary Care Physician: Vicenta Aly Other Clinician: Referring Physician: Vicenta Aly Treating Physician/Extender: Tito Dine in Treatment: 5 Education Assessment Education Provided To: Patient Education Topics Provided Wound/Skin Impairment: Handouts: Caring for Your Ulcer, Other: continue wound care as prescribed Methods: Demonstration,  Explain/Verbal Responses: State content correctly Electronic Signature(s) Signed: 07/05/2019 11:11:47 AM By: Gretta Cool, BSN, RN, CWS, Kim RN, BSN Entered By: Gretta Cool, BSN, RN, CWS, Kim on 07/05/2019 11:02:05 [name] ([id number]) -------------------------------------------------------------------------------- Wound Assessment Details Patient Name: REGINNA, SERMENO L. Date of Service: 07/05/2019 10:45 AM Medical Record Number: [id number] Patient Account Number: [id number] Date of Birth/Sex: [birth year] (73 y.o. F) Treating RN: Army Melia Primary Care Kinzly Pierrelouis: Vicenta Aly Other Clinician: Referring Tevis Dunavan: Vicenta Aly Treating Jacynda Brunke/Extender: Tito Dine in Treatment: 5 Wound Status Wound Number: 1 Primary Etiology: Diabetic Wound/Ulcer of the Lower Extremity Wound Location: Left, Plantar Foot Wound Status: Open Wounding Event: Gradually Appeared Comorbid Cataracts, Hypertension, Type II Diabetes, History: Osteoarthritis Date Acquired: 05/09/2019 Weeks Of Treatment: 5 Clustered Wound: No Photos Wound Measurements Length: (cm) 0.4 % R Width: (cm) 0.5 % R Depth: (cm) 2 Epi Area: (cm) 0.157 Tu Volume: (cm) 0.314 Un eduction in Area: 44.5% eduction in Volume: 57.3% thelialization: None nneling: No dermining: No Wound Description Classification: Grade 2 Fo Wound Margin: Flat and Intact Sl Exudate Amount: Medium Exudate Type: Serosanguineous Exudate Color: red, brown ul Odor After Cleansing: No ough/Fibrino Yes Wound Bed Granulation Amount: Large (67-100%) Exposed Structure Granulation Quality: Pink Fascia Exposed: No Necrotic Amount: Small (1-33%) Fat Layer (Subcutaneous Tissue) Exposed: Yes Necrotic Quality: Adherent Slough Tendon Exposed: No Muscle Exposed: No Joint Exposed: No Bone Exposed: No Treatment Notes Wound #1 (Left, Plantar Foot) Notes silver cell, ABD, conform Electronic Signature(s) Signed: 07/05/2019 11:17:39 AM By: [place] Muta, Estanislado Spire ([id number]) Entered By: Army Melia on 07/05/2019 10:45:08 [name] ([id number]) -------------------------------------------------------------------------------- Wound Assessment Details Patient Name: MCCONNON, Leyli L. Date of Service: 07/05/2019 10:45 AM Medical Record Number: [id number] Patient Account Number: [id number] Date of Birth/Sex: May 02, 1945 (73 y.o. F) Treating RN: Army Melia Primary Care Keir Viernes: Vicenta Aly Other Clinician: Referring Varsha Knock: Vicenta Aly Treating Tonnya Garbett/Extender: Tito Dine in Treatment: 5 Wound Status Wound Number: 2 Primary Etiology: Diabetic Wound/Ulcer of the Lower Extremity Wound Location: Left, Plantar Toe Great Wound Status: Open Wounding Event: Gradually Appeared Comorbid Cataracts, Hypertension, Type II Diabetes, History: Osteoarthritis Date Acquired: 05/09/2019 Weeks Of Treatment: 5 Clustered Wound: No Photos Wound Measurements Length: (cm) 0.2 % Width: (cm) 0.2 % Depth: (cm) 0.2 E Area: (cm) 0.031 Volume: (cm) 0.006 Reduction in Area: 84.2% Reduction in Volume: 70% pithelialization: None Wound Description Classification: Grade 1 Wound Margin: Flat and Intact Exudate Amount: Medium Exudate Type: Serosanguineous Exudate Color: red, brown Foul Odor After Cleansing: No Slough/Fibrino Yes Wound Bed Granulation Amount: Large (67-100%) Exposed Structure Granulation Quality: Red Fascia Exposed: No Necrotic Amount: None Present (0%) Fat Layer (Subcutaneous Tissue) Exposed: Yes Tendon Exposed: No Muscle Exposed: No Joint Exposed: No Bone Exposed: No Treatment Notes Wound #2 (Left, Plantar Toe Great) Notes silver cell, ABD, conform Electronic Signature(s) Signed: 07/05/2019 11:17:39 AM By: Sed[place] Muta, Estanislado Spire ([id number]) Entered By: Army Melia on 07/05/2019 10:46:09 [name]  ([id number]) -------------------------------------------------------------------------------- [place] Details Patient Name: [name] Date of Service: 07/05/2019 10:45 AM Medical Record Number: [id number] Patient Account Number: [id number] Date of Birth/Sex: 10/14/1945 (73  y.o. F) Treating RN: Cornell Barman Primary Care Jahmez Bily: Vicenta Aly Other Clinician: Referring Lusia Greis: Vicenta Aly Treating Margery Szostak/Extender: Tito Dine in Treatment: 5 Vital Signs Time Taken: 10:35 Temperature (F): 98.2 Height (in): 64 Pulse (bpm): 79 Weight (lbs): 230 Respiratory Rate (breaths/min): 18 Body Mass Index (BMI): 39.5 Blood Pressure (mmHg): 134/60 Reference Range: 80 - 120 mg / dl Electronic Signature(s) Signed: 07/05/2019 3:16:46 PM By: Lorine Bears RCP, RRT, CHT Entered By: Lorine Bears on 07/05/2019 10:37:26

## 2019-07-05 NOTE — Progress Notes (Signed)
RYEISHA, CESPEDES ([id number]) Visit Report for 06/14/2019 Debridement Details Patient Name: Debra [name], Debra [name]. Date of Service: 06/14/2019 10:45 AM Medical Record Number: [id number] Patient Account Number: [id number] Date of Birth/Sex: 03-20-1946 (74 y.o. F) Treating RN: Cornell Barman Primary Care Provider: Vicenta Aly Other Clinician: Referring Provider: Vicenta Aly Treating Provider/Extender: Tito Dine in Treatment: 2 Debridement Performed for Wound #1 Left,Plantar Foot Assessment: Performed By: Physician Ricard Dillon, MD Debridement Type: Debridement Severity of Tissue Pre Debridement: Fat layer exposed Level of Consciousness (Pre- Awake and Alert procedure): Pre-procedure Verification/Time Out Yes - 11:02 Taken: Start Time: 11:02 Pain Control: Lidocaine Total Area Debrided (L x W): 0.7 (cm) x 0.8 (cm) = 0.56 (cm) Tissue and other material debrided: Viable, Non-Viable, Slough, Skin: Dermis , Slough Level: Skin/Dermis Debridement Description: Selective/Open Wound Instrument: Blade, Forceps Bleeding: Minimum Hemostasis Achieved: Pressure Response to Treatment: Procedure was tolerated well Level of Consciousness (Post- Awake and Alert procedure): Post Debridement Measurements of Total Wound Length: (cm) 0.7 Width: (cm) 0.8 Depth: (cm) 1.9 Volume: (cm) 0.836 Character of Wound/Ulcer Post Debridement: Stable Severity of Tissue Post Debridement: Fat layer exposed Post Procedure Diagnosis Same as Pre-procedure Electronic Signature(s) Signed: 06/15/2019 4:13:18 AM By: Linton Ham MD Signed: 07/05/2019 11:11:47 AM By: Gretta Cool, BSN, RN, CWS, Kim RN, BSN Entered By: Linton Ham on 06/14/2019 11:14:20 Debra [name] ([id number]) -------------------------------------------------------------------------------- HPI Details Patient Name: Debra [name], [name] Date of Service: 06/14/2019 10:45 AM Medical Record Number: [id number] Patient Account Number:  [id number] Date of Birth/Sex: 09/10/1945 (74 y.o. 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 She tells me she is had a wound on the plantar aspect of her left great toe since August. There was swelling and redness and she was diagnosed with cellulitis. For a period of time she was on 2 different antibiotics 1 for a UTI and 1 for the toe and things actually got better. As far as I'm aware she has not had cultures or x-rays of the area. Although she states she has arterial disease the ABI in the right was 1.03 in our clinic on the left 1.08. I'm not certain how she is dressing this currently although she is certainly not offloading. She also has more recently developed a weeping wound on the left anterior leg and 2 small areas on the right anterior leg. She probably has chronic venous insufficiency with inflammation bilaterally. I suspect she also has development of a Charcot foot on the left 01/25/15; she still has a denuded blister on the right leg there is no open area on the left leg. On her left great toe plantar aspect the wound required a surgical debridement to remove skin nonviable subcutaneous tissue post debridement this actually cleans up quite nicely there is no overt infection here no evidence of soft tissue infection or ischemia 02/01/15; the patient arrives with poorly controlled edema bilateral legs. She has a open wound on the right leg. She has not been wearing the healing sagittals and therefore the left plantar toe wound is not better with surrounding callus Readmission: 09/07/18 on evaluation today patient actually presents for initial inspection our clinic concerning issues that she's been  having with her left great toe along with the bilateral lower extremities. The lower Trinity ulcers appear to be a  combination of Venus and diabetic and the great toe ulcer is a diabetic neuropathy type ulcer. Unfortunately she's had these for quite some time in regard to the toe and the right lower extremity left lower Trinity ulcers she tells me have appeared in the past week. There's no sign of infection at any site which is good news. In regard to the toe ulcer I see no evidence of bone exposure which is good news. There was some question as to whether or not bone was exposed when she saw her primary care provider. The patient does have a history as well of hypertension. 09/14/18 on evaluation today patient's wound to lower extremity actually appear to be doing excellent. In fact the biggest thing I see is that the alginate kind of got stuck to the wound bed's which is the main issue here. Fortunately there's no signs of active infection which is good news. No fevers, chills, nausea, or vomiting noted at this time. 09/21/18 on evaluation today patient actually appears to be doing excellent in regard to her bilateral lower extremities both are healed. In regard to her left great toe ulcer this is not completely healed at this point in fact it really does show some signs of callous buildup as well as Slough in the service wound although it's minimal. No fevers, chills, nausea, or vomiting noted at this time. 09/28/18 on evaluation today patient actually appears to be doing okay in regard to her toe ulcer. Unfortunately she does have some issues with the wound. Somewhat dry but it sounds like she is not been putting the collagen on the wound bed. She has been just utilizing a Dry gauze dressing. She states that she "couldn't find the collagen". Nonetheless I think this is why the wound bed appears to be so dry today. Obviously we do not want it to wet macerated but we also do not want it to dry. 10/12/18  on evaluation today patient appears to be doing about the same in regard to her toe ulcer. She's been tolerating the dressing changes without complication. With that being said she did not want to feed more in the past few days she tells me and it does show in the evidence of her lower extremity swelling at this point. Fortunately there does not appear to be any signs of active infection at this time. With that being said she's been tolerating the dressing changes without complication. No fevers, chills, nausea, or vomiting noted at this time. The7/29/2020 on evaluation today patient actually appears to be doing quite well with regard to her toe ulcer. This is showing signs of improvement and in fact wound is measuring about 60% the size it was last evaluation. This is excellent news. Overall I am very pleased with how she seems to be progressing she does tell me she is taking more breaks and trying to stay off of it as much as possible. 10/26/2018 on evaluation today patient actually appears to be doing quite well with regard to her wounds on the wound on the left great toe. She has been tolerating the dressing changes without complication. Fortunately there is no signs of active infection at this time. Overall very pleased with the progress that is been made up to this point. With that being said she seems to be stalled out at this time and I think we may need to try something a little different in order to get this to completely heal. 11/02/2018 upon evaluation today patient actually appears to  be doing well with regard to her toe ulcer from the standpoint of the overall appearance. With that being said she is having some issues currently with the left lower extremity where she has a small blister that opened in the past 24 hours that does need to be addressed today as well. Fortunately there is no sign of active infection at this time which is good news. 11/09/2018 on evaluation today patient actually  appears to be doing a little better in regard to her toe ulcer. She has been tolerating the dressing changes without complication. Fortunately there is no signs of active infection at this time. No fevers, chills, nausea, vomiting, or diarrhea. Overall I am actually rather pleased with how things seem to be progressing at this time. 11/16/2018 on evaluation today patient's toe ulcer appears to be doing quite well which is good news. Fortunately there is no signs of active infection Debra [name], [name] ([id number]) which is also good news. No fevers, chills, nausea, vomiting, or diarrhea. As of this point we still have not heard anything back about the Dermagraft and whether or not this will be approved. 11/23/18 upon evaluation today patient actually appears to be doing quite well with regard to her toe ulcer all things considered. We still not gotten approval from Dermagraft as of yet. Obviously the sooner we can get this to better in my pinion nonetheless there's gonna be some need for sharp debridement today based on what I'm seeing. 11/30/2018 on evaluation today patient actually appears to be doing about the same with regard to her toe ulcer. Fortunately there is no severe evidence of callus buildup which is good news. With that being said I definitely think that she will do well with the Dermagraft which fortunately we have gotten approved at this time. There is no signs of infection so I think this is absolutely appropriate for [place] to attempt at this point. 12/07/2018 on evaluation today patient appears to be doing much better with regard to her toe ulcer at this time. Fortunately there is no signs of active infection at this point. She has been tolerating the dressing changes without complication. No fevers, chills, nausea, vomiting, or diarrhea. I do believe the Dermagraft has been beneficial for her her wound seems to be measuring much smaller today compared to last week's evaluation this is  excellent. Overall she continues to make good progress. 12/14/2018 on evaluation today patient appears to be doing well with regard to her toe ulcer. This is showing signs of improvement which is good news. Overall I am very pleased With how things appear. There is no signs of active infection at this time. I do feel like the wound is appearing much more healthy based on what I see today. Today is Dermagraft application #3. ADMISSION 05/31/2019 The patient's previous records from our office in [place] are included above. The patient is here for a deep probing wound on the plantar left heel. 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 It would appear that this started sometime in December although the details are vague. She was in urgent care on 04/14/2019 with what was felt to be a wound infection in the left foot there was noted to be drainage and odor. She was put on doxycycline and x-ray at that time showed no osteomyelitis. She was back in the ER on 04/22/2019 with increasing swelling and redness on the plantar foot. There is a picture in epic to  reflect this really looked quite impressive. She adamantly refused admission. She was given clindamycin again another x-ray was negative but noted soft tissue swelling. The patient was largely in [place] from June to September with a wound on the left plantar toe. Dermagraft was tried currently she was discharged on healed when the patient's insurance stopped paying for her to come there. She dropped out of the clinic at her own insistence. The patient is not on any current antibiotics. I am not clear how she is dressing this. The area on the left great toe has eschared over Past medical history; type 2 diabetes with neuropathy, hypertension, chronic left toe ulcer, history of small bowel obstruction secondary to hernia status post ventral hernia repair ABIs in our clinic were 1.12 on the left. The patient  had formal arterial studies in October 2016 at that point her ABI was 1.2 TBI at 1.02 on the right triphasic waveforms. On the left ABI at 1.24 TBI was not done probably because of bandaging triphasic waveforms 06/07/2019; the patient has a new wound this week on the plantar part of her left first toe. This had eschar on it last week I did not debride this. Culture of the area last week showed group B strep and a few Morganella morganii. The latter would not be covered by the empiric doxycycline I gave her. We use silver alginate and the heel wound which I think the patient is changing on her own We also have problems with the MRI. The patient arrived in the clinic saying her insurance that she already had an MRI and therefore would not pay for it. We looked through epic in [place] link. There is no MRI in either area. She had to negative plain x-rays for osteomyelitis which does not preclude considering an MRI in this diabetic woman with a wound that probes to bone 3/24; patient still has a deep probing area on the plantar aspect of her heel. Her MRI is on 06/19/2019 I would be surprised if this did not show osteomyelitis. He also has an area on her plantar toe that opened again last week. We have been using silver alginate to both wound areas. She is completing the ciprofloxacin that I gave her. This was to cover the Morganella on her culture listed on 3/17. It also came out today that the patient has not changed her dressing since last Saturday which might account for some of the odor and the maceration around the wound. She is managing her own dressings at home and I would be surprised if she was exactly doing this properly even applying the dressing level on the frequency. We may need to spend a little more time with her next visit Electronic Signature(s) Signed: 06/15/2019 4:13:18 AM By: Linton Ham MD Entered By: Linton Ham on 06/14/2019 11:51:44 Debra [name]  ([id number]) -------------------------------------------------------------------------------- Physical Exam Details Patient Name: DAYLAN, NAEF L. Date of Service: 06/14/2019 10:45 AM Medical Record Number: [id number] Patient Account Number: [id number] Date of Birth/Sex: 11-01-1945 (74 y.o. F) Treating RN: Cornell Barman Primary Care Provider: Vicenta Aly Other Clinician: Referring Provider: Vicenta Aly Treating Provider/Extender: Tito Dine in Treatment: 2 Constitutional Patient is hypertensive.. Pulse regular and within target range for patient.Marland Kitchen Respirations regular, non-labored and within target range.. Temperature is normal and within the target range for the patient.Marland Kitchen appears in no distress. Notes Wound exam; a lot of maceration around the area on the heel which required a reasonably extensive debridement with pickups and a #  15 scalpel. The wound itself still probes deeply although I was less convinced that there was palpable bone at the end of this very tiny surface area. There is no purulent drainage but there is an odor oPlantar first toe wound is small but again with some depth. No evidence of surrounding infection here either Electronic Signature(s) Signed: 06/15/2019 4:13:18 AM By: Linton Ham MD Entered By: Linton Ham on 06/14/2019 11:17:10 Debra [name] ([id number]) -------------------------------------------------------------------------------- Physician Orders Details Patient Name: SHARLIE, PICKREN L. Date of Service: 06/14/2019 10:45 AM Medical Record Number: [id number] Patient Account Number: [id number] Date of Birth/Sex: 12/06/1945 (74 y.o. F) Treating RN: Cornell Barman Primary Care Provider: Vicenta Aly Other Clinician: Referring Provider: Vicenta Aly Treating Provider/Extender: Tito Dine in Treatment: 2 Verbal / Phone Orders: No Diagnosis Coding Wound Cleansing Wound #1 Left,Plantar Foot o May shower with  protection. - Do not get dressing wet. o No tub bath. Wound #2 Left,Plantar Toe Great o May shower with protection. - Do not get dressing wet. o No tub bath. Anesthetic (add to Medication List) Wound #1 Left,Plantar Foot o Topical Lidocaine 4% cream applied to wound bed prior to debridement (In Clinic Only). Wound #2 Left,Plantar Toe Great o Topical Lidocaine 4% cream applied to wound bed prior to debridement (In Clinic Only). Primary Wound Dressing Wound #1 Left,Plantar Foot o Silver Alginate Wound #2 Left,Plantar Toe Great o Silver Alginate o Other: - Coverlet Secondary Dressing Wound #1 Left,Plantar Foot o ABD and Kerlix/Conform Dressing Change Frequency Wound #1 Left,Plantar Foot o Change Dressing Monday, Wednesday, Friday - Or more often if wet. Wound #2 Left,Plantar Toe Great o Change Dressing Monday, Wednesday, Friday Follow-up Appointments Wound #1 [place] o Return Appointment in 1 week. Wound #2 Left,Plantar Toe Great o Return Appointment in 1 week. Off-Loading Wound #1 Left,Plantar Foot o Heel suspension boot Wound #2 Left,Plantar Toe Great o Heel suspension boot Additional Orders / Instructions Wound #1 Left,Plantar Foot o Increase protein intake. Debra [name], Debra [name] ([id number]) o Other: - Remove Silver Alginate on Monday before MRI. Wound #2 Left,Plantar Toe Great o Increase protein intake. o Other: - Remove Silver Alginate on Monday before MRI. Medications-please add to medication list. Wound #1 Left,Plantar Foot o P.O. Antibiotics - Completed Cipro Wound #2 Left,Plantar Toe Great o P.O. Antibiotics - Completed Cipro Notes MRI Monday Electronic Signature(s) Signed: 06/15/2019 4:13:18 AM By: Linton Ham MD Signed: 07/05/2019 11:11:47 AM By: Gretta Cool, BSN, RN, CWS, Kim RN, BSN Entered By: Gretta Cool, BSN, RN, CWS, Kim on 06/14/2019 11:09:44 Debra [name]  ([id number]) -------------------------------------------------------------------------------- Problem List Details Patient Name: ADELIA, BORDEN L. Date of Service: 06/14/2019 10:45 AM Medical Record Number: [id number] Patient Account Number: [id number] Date of Birth/Sex: 05-May-1945 (74 y.o. F) Treating RN: Cornell Barman Primary Care Provider: Vicenta Aly Other Clinician: Referring Provider: Vicenta Aly Treating Provider/Extender: Tito Dine in Treatment: 2 Active Problems ICD-10 Evaluated Encounter Code Description Active Date Today Diagnosis E11.621 Type 2 diabetes mellitus with foot ulcer 05/31/2019 No Yes L97.523 Non-pressure chronic ulcer of other part of left foot with necrosis of muscle 05/31/2019 No Yes L97.522 Non-pressure chronic ulcer of other part of left foot with fat layer exposed 06/07/2019 No Yes E11.42 Type 2 diabetes mellitus with diabetic polyneuropathy 05/31/2019 No Yes M14.672 Charcot's joint, left ankle and foot 05/31/2019 No Yes Inactive Problems Resolved Problems Electronic Signature(s) Signed: 06/15/2019 4:13:18 AM By: Linton Ham MD Entered By: Linton Ham on 06/14/2019 11:13:39 Debra [name] ([id number]) -------------------------------------------------------------------------------- Progress Note Details Patient Name: Debra [name],  [name] Date of Service: 06/14/2019 10:45 AM Medical Record Number: [id number] Patient Account Number: [id number] Date of Birth/Sex: April 28, 1945 (74 y.o. 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 She tells me she is had a wound on the plantar aspect of her left great toe since August. There was swelling and redness and she  was diagnosed with cellulitis. For a period of time she was on 2 different antibiotics 1 for a UTI and 1 for the toe and things actually got better. As far as I'm aware she has not had cultures or x-rays of the area. Although she states she has arterial disease the ABI in the right was 1.03 in our clinic on the left 1.08. I'm not certain how she is dressing this currently although she is certainly not offloading. She also has more recently developed a weeping wound on the left anterior leg and 2 small areas on the right anterior leg. She probably has chronic venous insufficiency with inflammation bilaterally. I suspect she also has development of a Charcot foot on the left 01/25/15; she still has a denuded blister on the right leg there is no open area on the left leg. On her left great toe plantar aspect the wound required a surgical debridement to remove skin nonviable subcutaneous tissue post debridement this actually cleans up quite nicely there is no overt infection here no evidence of soft tissue infection or ischemia 02/01/15; the patient arrives with poorly controlled edema bilateral legs. She has a open wound on the right leg. She has not been wearing the healing sagittals and therefore the left plantar toe wound is not better with surrounding callus Readmission: 09/07/18 on evaluation today patient actually presents for initial inspection our clinic concerning issues that she's been having with her left great toe along with the bilateral lower extremities. The lower Trinity ulcers appear to be a combination of Venus and diabetic and the great toe ulcer is a diabetic neuropathy type ulcer. Unfortunately she's had these for quite some time in regard to the toe and the right lower extremity left lower Trinity ulcers she tells me have appeared in the past week. There's no sign of infection at any site which is good news. In regard to the toe ulcer I see no evidence of bone exposure which is good  news. There was some question as to whether or not bone was exposed when she saw her primary care provider. The patient does have a history as well of hypertension. 09/14/18 on evaluation today patient's wound to lower extremity actually appear to be doing excellent. In fact the biggest thing I see is that the alginate kind of got stuck to the wound bed's which is the main issue here. Fortunately there's no signs of active infection which is good news. No fevers, chills, nausea, or vomiting noted at this time. 09/21/18 on evaluation today patient actually appears to be doing excellent in regard to her bilateral lower extremities both are healed. In regard to her left great toe ulcer this is not completely healed at this point in fact it really does show some signs of callous buildup as well as Slough in the service wound although it's minimal. No fevers, chills, nausea, or vomiting noted at this time. 09/28/18 on evaluation today  patient actually appears to be doing okay in regard to her toe ulcer. Unfortunately she does have some issues with the wound. Somewhat dry but it sounds like she is not been putting the collagen on the wound bed. She has been just utilizing a Dry gauze dressing. She states that she "couldn't find the collagen". Nonetheless I think this is why the wound bed appears to be so dry today. Obviously we do not want it to wet macerated but we also do not want it to dry. 10/12/18 on evaluation today patient appears to be doing about the same in regard to her toe ulcer. She's been tolerating the dressing changes without complication. With that being said she did not want to feed more in the past few days she tells me and it does show in the evidence of her lower extremity swelling at this point. Fortunately there does not appear to be any signs of active infection at this time. With that being said she's been tolerating the dressing changes without complication. No fevers, chills, nausea, or  vomiting noted at this time. The7/29/2020 on evaluation today patient actually appears to be doing quite well with regard to her toe ulcer. This is showing signs of improvement and in fact wound is measuring about 60% the size it was last evaluation. This is excellent news. Overall I am very pleased with how she seems to be progressing she does tell me she is taking more breaks and trying to stay off of it as much as possible. 10/26/2018 on evaluation today patient actually appears to be doing quite well with regard to her wounds on the wound on the left great toe. She has been tolerating the dressing changes without complication. Fortunately there is no signs of active infection at this time. Overall very pleased with the progress that is been made up to this point. With that being said she seems to be stalled out at this time and I think we may need to try something a little different in order to get this to completely heal. 11/02/2018 upon evaluation today patient actually appears to be doing well with regard to her toe ulcer from the standpoint of the overall appearance. With that being said she is having some issues currently with the left lower extremity where she has a small blister that opened in the past 24 hours that does need to be addressed today as well. Fortunately there is no sign of active infection at this time which is good news. 11/09/2018 on evaluation today patient actually appears to be doing a little better in regard to her toe ulcer. She has been tolerating the dressing changes without complication. Fortunately there is no signs of active infection at this time. No fevers, chills, nausea, vomiting, or diarrhea. Overall I am actually rather pleased with how things seem to be progressing at this time. 11/16/2018 on evaluation today patient's toe ulcer appears to be doing quite well which is good news. Fortunately there is no signs of active infection Debra [name], [name]  ([id number]) which is also good news. No fevers, chills, nausea, vomiting, or diarrhea. As of this point we still have not heard anything back about the Dermagraft and whether or not this will be approved. 11/23/18 upon evaluation today patient actually appears to be doing quite well with regard to her toe ulcer all things considered. We still not gotten approval from Dermagraft as of yet. Obviously the sooner we can get this to better in my pinion nonetheless there's  gonna be some need for sharp debridement today based on what I'm seeing. 11/30/2018 on evaluation today patient actually appears to be doing about the same with regard to her toe ulcer. Fortunately there is no severe evidence of callus buildup which is good news. With that being said I definitely think that she will do well with the Dermagraft which fortunately we have gotten approved at this time. There is no signs of infection so I think this is absolutely appropriate for [place] to attempt at this point. 12/07/2018 on evaluation today patient appears to be doing much better with regard to her toe ulcer at this time. Fortunately there is no signs of active infection at this point. She has been tolerating the dressing changes without complication. No fevers, chills, nausea, vomiting, or diarrhea. I do believe the Dermagraft has been beneficial for her her wound seems to be measuring much smaller today compared to last week's evaluation this is excellent. Overall she continues to make good progress. 12/14/2018 on evaluation today patient appears to be doing well with regard to her toe ulcer. This is showing signs of improvement which is good news. Overall I am very pleased With how things appear. There is no signs of active infection at this time. I do feel like the wound is appearing much more healthy based on what I see today. Today is Dermagraft application #3. ADMISSION 05/31/2019 The patient's previous records from our office in [place]  are included above. The patient is here for a deep probing wound on the plantar left heel. 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 It would appear that this started sometime in December although the details are vague. She was in urgent care on 04/14/2019 with what was felt to be a wound infection in the left foot there was noted to be drainage and odor. She was put on doxycycline and x-ray at that time showed no osteomyelitis. She was back in the ER on 04/22/2019 with increasing swelling and redness on the plantar foot. There is a picture in epic to reflect this really looked quite impressive. She adamantly refused admission. She was given clindamycin again another x-ray was negative but noted soft tissue swelling. The patient was largely in [place] from June to September with a wound on the left plantar toe. Dermagraft was tried currently she was discharged on healed when the patient's insurance stopped paying for her to come there. She dropped out of the clinic at her own insistence. The patient is not on any current antibiotics. I am not clear how she is dressing this. The area on the left great toe has eschared over Past medical history; type 2 diabetes with neuropathy, hypertension, chronic left toe ulcer, history of small bowel obstruction secondary to hernia status post ventral hernia repair ABIs in our clinic were 1.12 on the left. The patient had formal arterial studies in October 2016 at that point her ABI was 1.2 TBI at 1.02 on the right triphasic waveforms. On the left ABI at 1.24 TBI was not done probably because of bandaging triphasic waveforms 06/07/2019; the patient has a new wound this week on the plantar part of her left first toe. This had eschar on it last week I did not debride this. Culture of the area last week showed group B strep and a few Morganella morganii. The latter would not be covered by the empiric doxycycline I gave her.  We use silver alginate and the  heel wound which I think the patient is changing on her own We also have problems with the MRI. The patient arrived in the clinic saying her insurance that she already had an MRI and therefore would not pay for it. We looked through epic in [place] link. There is no MRI in either area. She had to negative plain x-rays for osteomyelitis which does not preclude considering an MRI in this diabetic woman with a wound that probes to bone 3/24; patient still has a deep probing area on the plantar aspect of her heel. Her MRI is on 06/19/2019 I would be surprised if this did not show osteomyelitis. He also has an area on her plantar toe that opened again last week. We have been using silver alginate to both wound areas. She is completing the ciprofloxacin that I gave her. This was to cover the Morganella on her culture listed on 3/17. It also came out today that the patient has not changed her dressing since last Saturday which might account for some of the odor and the maceration around the wound. She is managing her own dressings at home and I would be surprised if she was exactly doing this properly even applying the dressing level on the frequency. We may need to spend a little more time with her next visit Objective Constitutional Patient is hypertensive.. Pulse regular and within target range for patient.Marland Kitchen Respirations regular, non-labored and within target range.. Temperature is normal and within the target range for the patient.Marland Kitchen appears in no distress. Vitals Time Taken: 10:45 AM, Height: 64 in, Weight: 230 lbs, BMI: 39.5, Temperature: 98.0 F, Pulse: 80 bpm, Respiratory Rate: 16 breaths/min, Blood Debra [name], [name] ([id number]) Pressure: 145/62 mmHg. General Notes: Wound exam; a lot of maceration around the area on the heel which required a reasonably extensive debridement with pickups and a #15 scalpel. The wound itself still probes deeply although I was less  convinced that there was palpable bone at the end of this very tiny surface area. There is no purulent drainage but there is an odor Plantar first toe wound is small but again with some depth. No evidence of surrounding infection here either Integumentary (Hair, Skin) Wound #1 status is Open. Original cause of wound was Gradually Appeared. The wound is located on the [place]. The wound measures 0.7cm length x 0.8cm width x 1.9cm depth; 0.44cm^2 area and 0.836cm^3 volume. There is Fat Layer (Subcutaneous Tissue) Exposed exposed. There is no tunneling or undermining noted. There is a medium amount of serosanguineous drainage noted. Foul odor after cleansing was noted. The wound margin is flat and intact. There is medium (34-66%) pink granulation within the wound bed. There is a medium (34-66%) amount of necrotic tissue within the wound bed including Adherent Slough. Wound #2 status is Open. Original cause of wound was Gradually Appeared. The wound is located on the [employer]. The wound measures 0.2cm length x 0.2cm width x 0.2cm depth; 0.031cm^2 area and 0.006cm^3 volume. There is Fat Layer (Subcutaneous Tissue) Exposed exposed. There is no tunneling or undermining noted. There is a medium amount of serosanguineous drainage noted. The wound margin is flat and intact. There is large (67-100%) pink granulation within the wound bed. There is a small (1-33%) amount of necrotic tissue within the wound bed including Adherent Slough. 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 There was a Selective/Open Wound Skin/Dermis Debridement with a total area of 0.56 sq cm performed by Ricard Dillon, MD. With the following instrument(s): Blade, and Forceps to remove Viable and Non-Viable tissue/material. Material removed includes Slough and Skin: Dermis and after achieving pain control using Lidocaine. A time out was conducted at 11:02, prior to the start of the procedure. A Minimum amount of bleeding was controlled with Pressure. The procedure was tolerated well. Post Debridement Measurements: 0.7cm length x 0.8cm width x 1.9cm depth; 0.836cm^3 volume. Character of Wound/Ulcer Post Debridement is stable. Severity of Tissue Post Debridement is: Fat layer exposed. Post procedure Diagnosis Wound #1: Same as Pre-Procedure Plan Wound Cleansing: Wound #1 Left,Plantar Foot: May shower with protection. - Do not get dressing wet. No tub bath. Wound #2 Left,Plantar Toe Great: May shower with protection. - Do not get dressing wet. No tub bath. Anesthetic (add to Medication List): Wound #1 Left,Plantar Foot: Debra [name], [name] ([id number]) Topical Lidocaine 4% cream applied to wound bed prior to debridement (In Clinic Only). Wound #2 Left,Plantar Toe Great: Topical Lidocaine 4% cream applied to wound bed prior to debridement (In Clinic Only). Primary Wound Dressing: Wound #1 Left,Plantar Foot: Silver Alginate Wound #2 Left,Plantar Toe Great: Silver Alginate Other: - Coverlet Secondary Dressing: Wound #1 Left,Plantar Foot: ABD and Kerlix/Conform Dressing Change Frequency: Wound #1 Left,Plantar Foot: Change Dressing Monday, Wednesday, Friday - Or more often if wet. Wound #2 Left,Plantar Toe Great: Change Dressing Monday, Wednesday, Friday Follow-up Appointments: Wound #1 Left,Plantar Foot: Return Appointment in 1 week. Wound #2 Left,Plantar Toe Great: Return Appointment in 1 week. Off-Loading: Wound #1  Left,Plantar Foot: Heel suspension boot Wound #2 Left,Plantar Toe Great: Heel suspension boot Additional Orders / Instructions: Wound #1 Left,Plantar Foot: Increase protein intake. Other: - Remove Silver Alginate on Monday before MRI. Wound #2 Left,Plantar Toe Great: Increase protein intake. Other: - Remove Silver Alginate on Monday before MRI. Medications-please add to medication list.: Wound #1 Left,Plantar Foot: P.O. Antibiotics - Completed Cipro Wound #2 Left,Plantar Toe Great: P.O. Antibiotics - Completed Cipro General Notes: MRI Monday 1. The patient's MRI is on 3/29. We will need to remove the silver alginate prior to this test and we explained this to her 2. She is completing ciprofloxacin today. She has had a week of doxycycline in a week of ciprofloxacin. I will wait to we see the MRI before considering additional antibiotics 3. If my suspicion is correct and the MRI shows osteomyelitis of the plantar calcaneus it is possible she will need IV antibiotics and an infectious disease consult. 4. If she does not have osteomyelitis she will need a total contact cast. She has a Charcot foot and there is undoubtedly excessive pressure on both these wound areas 5. No additional antibiotics for now I did not reculture this although there was a bit of an odor 6. Noteworthy that I saw her walk out with her plantar heel offloading boot. She is wide-based and somewhat unsteady. Hopefully she would be able to tolerate a total contact cast if and when we decide to use the Electronic Signature(s) Signed: 06/14/2019 11:53:15 AM By: Linton Ham MD Entered By: Linton Ham on 06/14/2019 11:53:14 Debra [name] ([id number]) --------------------------------------------------------------------------------  SuperBill Details Patient Name: Debra [name], Debra [name]. Date of Service: 06/14/2019 Medical Record Number: [id number] Patient Account Number: [id number] Date of Birth/Sex: 1946-03-15 (74 y.o.  F) Treating RN: Cornell Barman Primary Care Provider: Vicenta Aly Other Clinician: Referring Provider: Vicenta Aly Treating Provider/Extender: Tito Dine in Treatment: 2 Diagnosis Coding ICD-10 Codes Code Description E11.621 Type 2 diabetes mellitus with foot ulcer L97.523 Non-pressure chronic ulcer of other part of left foot with necrosis of muscle L97.522 Non-pressure chronic ulcer of other part of left foot with fat layer exposed E11.42 Type 2 diabetes mellitus with diabetic polyneuropathy M14.672 Charcot's joint, left ankle and foot Facility Procedures CPT4 Code: [id number] Description: [id number] - DEBRIDE WOUND 1ST 20 SQ CM OR < Modifier: Quantity: 1 CPT4 Code: Description: ICD-10 Diagnosis Description L97.523 Non-pressure chronic ulcer of other part of left foot with necrosis of muscle L97.522 Non-pressure chronic ulcer of other part of left foot with fat layer exposed Modifier: Quantity: Physician Procedures CPT4 [id number] Description: [id number] - WC PHYS DEBR WO ANESTH 20 SQ CM Modifier: Quantity: 1 CPT4 Code: Description: ICD-10 Diagnosis Description L97.523 Non-pressure chronic ulcer of other part of left foot with necrosis of muscle L97.522 Non-pressure chronic ulcer of other part of left foot with fat layer exposed Modifier: Quantity: Electronic Signature(s) Signed: 06/15/2019 4:13:18 AM By: Linton Ham MD Entered By: Linton Ham on 06/14/2019 11:20:40

## 2019-07-10 ENCOUNTER — Other Ambulatory Visit: Payer: Self-pay

## 2019-07-10 ENCOUNTER — Ambulatory Visit (INDEPENDENT_AMBULATORY_CARE_PROVIDER_SITE_OTHER): Payer: Medicare Other | Admitting: Infectious Disease

## 2019-07-10 ENCOUNTER — Encounter: Payer: Self-pay | Admitting: Infectious Disease

## 2019-07-10 VITALS — Wt 264.0 lb

## 2019-07-10 DIAGNOSIS — M86172 Other acute osteomyelitis, left ankle and foot: Secondary | ICD-10-CM | POA: Insufficient documentation

## 2019-07-10 DIAGNOSIS — G5793 Unspecified mononeuropathy of bilateral lower limbs: Secondary | ICD-10-CM | POA: Diagnosis not present

## 2019-07-10 DIAGNOSIS — Z794 Long term (current) use of insulin: Secondary | ICD-10-CM

## 2019-07-10 DIAGNOSIS — L089 Local infection of the skin and subcutaneous tissue, unspecified: Secondary | ICD-10-CM

## 2019-07-10 DIAGNOSIS — E11628 Type 2 diabetes mellitus with other skin complications: Secondary | ICD-10-CM | POA: Diagnosis not present

## 2019-07-10 DIAGNOSIS — E1142 Type 2 diabetes mellitus with diabetic polyneuropathy: Secondary | ICD-10-CM | POA: Diagnosis not present

## 2019-07-10 DIAGNOSIS — A491 Streptococcal infection, unspecified site: Secondary | ICD-10-CM

## 2019-07-10 DIAGNOSIS — A498 Other bacterial infections of unspecified site: Secondary | ICD-10-CM

## 2019-07-10 HISTORY — DX: Type 2 diabetes mellitus with other skin complications: E11.628

## 2019-07-10 HISTORY — DX: Streptococcal infection, unspecified site: A49.1

## 2019-07-10 HISTORY — DX: Other acute osteomyelitis, left ankle and foot: M86.172

## 2019-07-10 HISTORY — DX: Local infection of the skin and subcutaneous tissue, unspecified: L08.9

## 2019-07-10 HISTORY — DX: Other bacterial infections of unspecified site: A49.8

## 2019-07-10 NOTE — Progress Notes (Signed)
Subjective:   Reason for Consult: DFU with osteomyelitis of calcaneous  Requesting Physician: Jeri Cos, PA-Cm, Dr Dellia Nims   Patient ID: Debra [name], female    DOB: 03-15-1946, 74 y.o.   MRN: [id number]  HPI 74 year old Caucasian female with past medical history significant for diabetes mellitus, chronic venous insufficiency, diabetic neuropathy who is struggled with diabetic foot ulcers.  She has had problems with 1 ulcer near her toe but then going back to July she had eruption of an ulcer on her heel.  This is been managed by close follow-up with the wound care with Jeri Cos PA serial debridements antibiotics.  More recently there was concern for osteomyelitis and MRI of the heel showed some findings suggestive of osteomyelitis involving part of the calcaneus.  She is referred to [place] for evaluation and treatment of calcaneal osteomyelitis.  In terms of culture data more recently on March 10 she had some tissue culture that grew GB streptococcus and Morganella.   In the past she had cultures taken at [place] which had grown a Pan sensitive pseudomonas from culture in 01/27/2017 pan sensitive Pseudomonas and Group B strep and isn 06/22/2017.  I cannot however tell where those cultures were obtained the provider mentioned cellulitis and mentioned obtaining a wound culture.  She is given the patient get me a history of her heel erupting last July with think that these cultures are only relevant to that prior wound that she had.    Past Medical History:  Diagnosis Date  . Cellulitis of left leg    history of, most recent episode 01/09  . Chronic venous insufficiency   . Diabetes mellitus with neurological manifestation ([place])   . Diverticulitis    h/o 2-3 episodes in past  . Hyperlipidemia LDL goal < 100   . Hypertension goal BP (blood pressure) < 130/80   . Leg edema    secondary to chronic venous insufficiency  . Morbid obesity ([place])   . Osteopenia    DEXA scan 7/07     Past Surgical History:  Procedure Laterality Date  . ABDOMINAL HYSTERECTOMY    . APPENDECTOMY    . CHOLECYSTECTOMY    . CHOLECYSTECTOMY N/A 04/14/2013   Procedure: LAPAROSCOPIC CHOLECYSTECTOMY WITH INTRAOPERATIVE CHOLANGIOGRAM;  Surgeon: Adin Hector, MD;  Location: [facility];  Service: General;  Laterality: N/A;  . COLECTOMY     with diverting colsotmy and revision in the 1990's for diverticulitis  . ERCP N/A 04/17/2013   Procedure: ENDOSCOPIC RETROGRADE CHOLANGIOPANCREATOGRAPHY (ERCP);  Surgeon: Irene Shipper, MD;  Location: [facility];  Service: Endoscopy;  Laterality: N/A;  note pt want general anesthesia for this case.  preferto perform in endo unit, but if unavailable please arrange time in OR  . EYE SURGERY  8/13   left cataract removal & retinal repair  . INSERTION OF MESH N/A 07/30/2017   Procedure: INSERTION OF MESH;  Surgeon: Jovita Kussmaul, MD;  Location: [facility];  Service: General;  Laterality: N/A;  . LAPAROSCOPIC LYSIS OF ADHESIONS N/A 04/14/2013   Procedure: LAPAROSCOPIC LYSIS OF ADHESIONS;  Surgeon: Adin Hector, MD;  Location: [facility];  Service: General;  Laterality: N/A;  . LAPAROTOMY N/A 07/30/2017   Procedure: EXPLORATORY LAPAROTOMY;  Surgeon: Jovita Kussmaul, MD;  Location: [facility];  Service: General;  Laterality: N/A;  . LYSIS OF ADHESION N/A 07/30/2017   Procedure: EXTENSIVE LYSIS OF ADHESION;  Surgeon: Jovita Kussmaul, MD;  Location: [facility];  Service: General;  Laterality:  N/A;  . OMENTECTOMY N/A 07/30/2017   Procedure: PARTIAL OMENTECTOMY;  Surgeon: Jovita Kussmaul, MD;  Location: [facility];  Service: General;  Laterality: N/A;  . VENTRAL HERNIA REPAIR N/A 07/30/2017   Procedure: HERNIA REPAIR VENTRAL ADULT;  Surgeon: Jovita Kussmaul, MD;  Location: [facility];  Service: General;  Laterality: N/A;    Family History  Problem Relation Age of Onset  . Heart disease Mother   . Heart disease Father   . Heart disease Brother   . Cancer Brother   . Cancer Brother   .  Hypertension Sister   . Heart disease Sister       Social History   Socioeconomic History  . Marital status: Married    Spouse name: Not on file  . Number of children: Not on file  . Years of education: Not on file  . Highest education level: Not on file  Occupational History  . Not on file  Tobacco Use  . Smoking status: Never Smoker  . Smokeless tobacco: Never Used  Substance and Sexual Activity  . Alcohol use: No  . Drug use: No  . Sexual activity: Yes  Other Topics Concern  . Not on file  Social History Narrative   One sister with chronic progressive disease requiring a trach, and 24/7 care   One sister with an abrupt hospitalization for a critical condition   Social Determinants of Health   Financial Resource Strain:   . Difficulty of Paying Living Expenses:   Food Insecurity:   . Worried About Charity fundraiser in the Last Year:   . Arboriculturist in the Last Year:   Transportation Needs:   . Film/video editor (Medical):   Marland Kitchen Lack of Transportation (Non-Medical):   Physical Activity:   . Days of Exercise per Week:   . Minutes of Exercise per Session:   Stress:   . Feeling of Stress :   Social Connections:   . Frequency of Communication with Friends and Family:   . Frequency of Social Gatherings with Friends and Family:   . Attends Religious Services:   . Active Member of Clubs or Organizations:   . Attends Archivist Meetings:   Marland Kitchen Marital Status:     Allergies  Allergen Reactions  . Aspirin Nausea Only    And causes bruises  . Banana Hives  . Hydrocodone Hives  . Neomycin-Polymyxin-Gramicidin Itching and Swelling    Eye drops caused swelling in face and rash and itching on arms and neck  . Penicillins Hives and Itching    Has patient had a PCN reaction causing immediate rash, facial/tongue/throat swelling, SOB or lightheadedness with hypotension: Yes Has patient had a PCN reaction causing severe rash involving mucus membranes or  skin necrosis: No Has patient had a PCN reaction that required hospitalization: Yes Has patient had a PCN reaction occurring within the last 10 years: Yes If all of the above answers are "NO", then may proceed with Cephalosporin use.   . Tramadol Nausea And Vomiting  . Voltaren [Diclofenac Sodium] Nausea And Vomiting  . Latex Hives and Rash     Current Outpatient Medications:  .  atorvastatin (LIPITOR) 40 MG tablet, TAKE 1 TABLET BY MOUTH EVERY DAY (Patient taking differently: Take 40 mg by mouth at bedtime. ), Disp: 90 tablet, Rfl: 1 .  celecoxib (CELEBREX) 200 MG capsule, Take 200 mg by mouth 2 (two) times daily., Disp: , Rfl: 2 .  diphenhydrAMINE (BENADRYL) 25  MG tablet, Take 25 mg by mouth at bedtime as needed for itching., Disp: , Rfl:  .  doxycycline (VIBRAMYCIN) 100 MG capsule, Take 1 capsule (100 mg total) by mouth 2 (two) times daily., Disp: 20 capsule, Rfl: 0 .  furosemide (LASIX) 20 MG tablet, Take 20 mg by mouth every morning., Disp: , Rfl:  .  glucose blood (ACCU-CHEK AVIVA PLUS) test strip, Ell.65 check blood sugar twice daily as directed, Disp: 100 each, Rfl: 11 .  insulin NPH Human (HUMULIN N,NOVOLIN N) 100 UNIT/ML injection, Inject 76-98 Units into the skin See admin instructions. Injects 98 units before breakfast and 76 units before supper., Disp: , Rfl:  .  Insulin Pen Needle (BD PEN NEEDLE NANO U/F) 32G X 4 MM MISC, Use once daily with the administration of [place]o DX E11.65, Disp: 100 each, Rfl: 11 .  metFORMIN (GLUCOPHAGE) 500 MG tablet, Take 1 tablet (500 mg total) by mouth daily with breakfast. (Patient taking differently: Take 500 mg by mouth 2 (two) times daily with a meal. ), Disp: , Rfl: 5 .  metoprolol succinate (TOPROL-XL) 50 MG 24 hr tablet, TAKE 1 TABLET BY MOUTH ONCE DAILY (Patient taking differently: Take 50 mg by mouth daily. ), Disp: 90 tablet, Rfl: 1 .  omeprazole (PRILOSEC) 20 MG capsule, TAKE 1 CAPSULE BY MOUTH DAILY (Patient taking differently: Take 20 mg  by mouth daily. ), Disp: 30 capsule, Rfl: 0 .  pioglitazone (ACTOS) 15 MG tablet, Take 15 mg by mouth daily., Disp: , Rfl:  .  potassium chloride SA (K-DUR,KLOR-CON) 20 MEQ tablet, TAKE 1 TABLET(20 MEQ) BY MOUTH DAILY (Patient taking differently: Take 20 mEq by mouth daily. ), Disp: 30 tablet, Rfl: 5 .  valsartan (DIOVAN) 320 MG tablet, Take 320 mg by mouth daily., Disp: , Rfl:    Review of Systems  Constitutional: Negative for activity change, appetite change, chills, diaphoresis, fatigue, fever and unexpected weight change.  HENT: Negative for congestion, rhinorrhea, sinus pressure, sneezing, sore throat and trouble swallowing.   Eyes: Negative for photophobia and visual disturbance.  Respiratory: Negative for cough, chest tightness, shortness of breath, wheezing and stridor.   Cardiovascular: Negative for chest pain, palpitations and leg swelling.  Gastrointestinal: Negative for abdominal distention, abdominal pain, anal bleeding, blood in stool, constipation, diarrhea, nausea and vomiting.  Genitourinary: Negative for difficulty urinating, dysuria, flank pain and hematuria.  Musculoskeletal: Negative for arthralgias, back pain, gait problem, joint swelling and myalgias.  Skin: Positive for wound. Negative for color change, pallor and rash.  Neurological: Negative for dizziness, tremors, weakness and light-headedness.  Hematological: Negative for adenopathy. Does not bruise/bleed easily.  Psychiatric/Behavioral: Positive for dysphoric mood. Negative for agitation, behavioral problems, confusion, decreased concentration and sleep disturbance.       Objective:   Physical Exam Constitutional:      General: She is not in acute distress.    Appearance: Normal appearance. She is well-developed. She is not ill-appearing or diaphoretic.  HENT:     Head: Normocephalic and atraumatic.     Right Ear: Hearing and external ear normal.     Left Ear: Hearing and external ear normal.     Nose: No  nasal deformity or rhinorrhea.  Eyes:     General: No scleral icterus.    Conjunctiva/sclera: Conjunctivae normal.     Right eye: Right conjunctiva is not injected.     Left eye: Left conjunctiva is not injected.     Pupils: Pupils are equal, round, and reactive to light.  Neck:     Vascular: No JVD.  Cardiovascular:     Rate and Rhythm: Normal rate and regular rhythm.     Heart sounds: Normal heart sounds, S1 normal and S2 normal. No murmur. No friction rub.  Abdominal:     General: Bowel sounds are normal. There is no distension.     Palpations: Abdomen is soft.     Tenderness: There is no abdominal tenderness.  Musculoskeletal:        General: Normal range of motion.     Right shoulder: Normal.     Left shoulder: Normal.     Cervical back: Normal range of motion and neck supple.     Right hip: Normal.     Left hip: Normal.     Right knee: Normal.     Left knee: Normal.  Lymphadenopathy:     Head:     Right side of head: No submandibular, preauricular or posterior auricular adenopathy.     Left side of head: No submandibular, preauricular or posterior auricular adenopathy.     Cervical: No cervical adenopathy.     Right cervical: No superficial or deep cervical adenopathy.    Left cervical: No superficial or deep cervical adenopathy.  Skin:    General: Skin is warm and dry.     Coloration: Skin is not pale.     Findings: No abrasion, bruising, ecchymosis, erythema, lesion or rash.     Nails: There is no clubbing.  Neurological:     General: No focal deficit present.     Mental Status: She is alert and oriented to person, place, and time.     Sensory: No sensory deficit.     Coordination: Coordination normal.     Gait: Gait normal.  Psychiatric:        Attention and Perception: Attention normal. She is attentive.        Mood and Affect: Mood is anxious.        Speech: Speech normal.        Behavior: Behavior normal. Behavior is cooperative.        Thought Content:  Thought content normal.        Cognition and Memory: Cognition and memory normal.        Judgment: Judgment normal.     Foot 07/10/2019:            Assessment & Plan:  Left-sided calcaneal osteomyelitis of the heel:  I explained to the patient that the MRI findings were not completely conclusive but that obviously we were worried that she did have infection of the bone.  Also explained that trying to treat infections of the bone in particular the calcaneus is highly problematic in all patients but proved particularly in diabetics who have sustained damage to the lymphatics there arteriovenous supplies they are in their nerves and you have impaired immune function.  Unfortunately typically ultimately such patients require a below the knee amputation to affect cure.  Have another surgery such as a [place] procedure that sometimes have been done typically without success.  Certainly I am willing to try some systemic antibiotics.  It would be nice to get a deep culture from the bone if that is possible.  She is going to be seeing wound care later this week and if they can get a bone culture that we build to help guide our therapy.  If not we could consider referral to orthopedic surgery for a sample of the bone.  In the  absence of a specific target we could consider highly bioavailable antibiotics such as doxycycline and levofloxacin, or Bactrim and levofloxacin.  Certainly I do not like the risk of C. difficile colitis with the fluoroquinolones but I think we are more likely to achieve high levels of antibiotics in the bone with this drug versus trying to use a 4 times a day cephalosporin for example.  I can have her scheduled to see me in 5 weeks time.  Today we will also check inflammatory markers at baseline labs  Grieving: lost her husband to sudden cardiac arrest before she had worsening of her DFU. She says Church and friends have been supportive.

## 2019-07-11 LAB — COMPLETE METABOLIC PANEL WITHOUT GFR
AG Ratio: 1.4 (calc) (ref 1.0–2.5)
ALT: 9 U/L (ref 6–29)
AST: 13 U/L (ref 10–35)
Albumin: 4 g/dL (ref 3.6–5.1)
Alkaline phosphatase (APISO): 98 U/L (ref 37–153)
BUN/Creatinine Ratio: 20 (calc) (ref 6–22)
BUN: 19 mg/dL (ref 7–25)
CO2: 27 mmol/L (ref 20–32)
Calcium: 9.5 mg/dL (ref 8.6–10.4)
Chloride: 104 mmol/L (ref 98–110)
Creat: 0.94 mg/dL — ABNORMAL HIGH (ref 0.60–0.93)
GFR, Est African American: 70 mL/min/1.73m2
GFR, Est Non African American: 61 mL/min/1.73m2
Globulin: 2.8 g/dL (ref 1.9–3.7)
Glucose, Bld: 258 mg/dL — ABNORMAL HIGH (ref 65–99)
Potassium: 4.3 mmol/L (ref 3.5–5.3)
Sodium: 138 mmol/L (ref 135–146)
Total Bilirubin: 0.4 mg/dL (ref 0.2–1.2)
Total Protein: 6.8 g/dL (ref 6.1–8.1)

## 2019-07-11 LAB — CBC WITH DIFFERENTIAL/PLATELET
Absolute Monocytes: 730 {cells}/uL (ref 200–950)
Basophils Absolute: 49 {cells}/uL (ref 0–200)
Basophils Relative: 0.6 %
Eosinophils Absolute: 262 {cells}/uL (ref 15–500)
Eosinophils Relative: 3.2 %
HCT: 39.7 % (ref 35.0–45.0)
Hemoglobin: 12.7 g/dL (ref 11.7–15.5)
Lymphs Abs: 2271 {cells}/uL (ref 850–3900)
MCH: 29.1 pg (ref 27.0–33.0)
MCHC: 32 g/dL (ref 32.0–36.0)
MCV: 91.1 fL (ref 80.0–100.0)
MPV: 9.7 fL (ref 7.5–12.5)
Monocytes Relative: 8.9 %
Neutro Abs: 4887 {cells}/uL (ref 1500–7800)
Neutrophils Relative %: 59.6 %
Platelets: 290 Thousand/uL (ref 140–400)
RBC: 4.36 Million/uL (ref 3.80–5.10)
RDW: 13.1 % (ref 11.0–15.0)
Total Lymphocyte: 27.7 %
WBC: 8.2 Thousand/uL (ref 3.8–10.8)

## 2019-07-11 LAB — SEDIMENTATION RATE: Sed Rate: 22 mm/h (ref 0–30)

## 2019-07-11 LAB — C-REACTIVE PROTEIN: CRP: 3.8 mg/L

## 2019-07-12 ENCOUNTER — Other Ambulatory Visit: Payer: Self-pay

## 2019-07-12 ENCOUNTER — Encounter: Payer: Medicare Other | Admitting: Internal Medicine

## 2019-07-12 DIAGNOSIS — E11621 Type 2 diabetes mellitus with foot ulcer: Secondary | ICD-10-CM | POA: Diagnosis not present

## 2019-07-13 ENCOUNTER — Other Ambulatory Visit
Admission: RE | Admit: 2019-07-13 | Discharge: 2019-07-13 | Disposition: A | Discharge status: Home / Self Care | Payer: Medicare Other | Source: Ambulatory Visit | Attending: Internal Medicine | Admitting: Internal Medicine

## 2019-07-13 DIAGNOSIS — L03116 Cellulitis of left lower limb: Secondary | ICD-10-CM | POA: Insufficient documentation

## 2019-07-13 NOTE — Progress Notes (Signed)
LAELA, DEVINEY ([id number]) Visit Report for 07/12/2019 Arrival Information Details Patient Name: LIVELY, HABERMAN. Date of Service: 07/12/2019 10:45 AM Medical Record Number: [id number] Patient Account Number: [id number] Date of Birth/Sex: 05/07/1945 (74 y.o. F) Treating RN: Montey Hora Primary Care Verdene Creson: Vicenta Aly Other Clinician: Referring Kemper Heupel: Vicenta Aly Treating Ilan Kahrs/Extender: Tito Dine in Treatment: 6 Visit Information History Since Last Visit Added or deleted any medications: No Patient Arrived: Kasandra Knudsen Any new allergies or adverse reactions: No Arrival Time: 10:52 Had a fall or experienced change in No Accompanied By: self activities of daily living that may affect Transfer Assistance: None risk of falls: Patient Identification Verified: Yes Signs or symptoms of abuse/neglect since last visito No Secondary Verification Process Completed: Yes Hospitalized since last visit: No Implantable device outside of the clinic excluding No cellular tissue based products placed in the center since last visit: Has Dressing in Place as Prescribed: Yes Pain Present Now: No Electronic Signature(s) Signed: 07/12/2019 4:33:58 PM By: Montey Hora Entered By: Montey Hora on 07/12/2019 10:54:22 Heinke, Estanislado Spire ([id number]) -------------------------------------------------------------------------------- Encounter Discharge Information Details Patient Name: JOZLYNN, PLAIA L. Date of Service: 07/12/2019 10:45 AM Medical Record Number: [id number] Patient Account Number: [id number] Date of Birth/Sex: 1945/06/10 (74 y.o. F) Treating RN: Cornell Barman Primary Care Ladaja Yusupov: Vicenta Aly Other Clinician: Referring Dyan Labarbera: Vicenta Aly Treating Tyress Loden/Extender: Tito Dine in Treatment: 6 Encounter Discharge Information Items Post Procedure Vitals Discharge Condition: Stable Temperature (F): 98.4 Ambulatory Status: Ambulatory Pulse  (bpm): 87 Discharge Destination: Home Respiratory Rate (breaths/min): 16 Transportation: Private Auto Blood Pressure (mmHg): 138/55 Accompanied By: self Schedule Follow-up Appointment: Yes Clinical Summary of Care: Electronic Signature(s) Signed: 07/13/2019 5:10:21 PM By: Gretta Cool, BSN, RN, CWS, Kim RN, BSN Entered By: Gretta Cool, BSN, RN, CWS, Kim on 07/12/2019 11:21:01 [name] ([id number]) -------------------------------------------------------------------------------- Lower Extremity Assessment Details Patient Name: IANNACCONE, Merridith L. Date of Service: 07/12/2019 10:45 AM Medical Record Number: [id number] Patient Account Number: [id number] Date of Birth/Sex: May 01, 1945 (74 y.o. F) Treating RN: Montey Hora Primary Care Estanislado Surgeon: Vicenta Aly Other Clinician: Referring Lavance Beazer: Vicenta Aly Treating Durand Wittmeyer/Extender: Ricard Dillon Weeks in Treatment: 6 Edema Assessment Assessed: [Left: No] [Right: No] Edema: [Left: Ye] [Right: s] Vascular Assessment Pulses: Dorsalis Pedis Palpable: [Left:Yes] Electronic Signature(s) Signed: 07/12/2019 4:33:58 PM By: Montey Hora Entered By: Montey Hora on 07/12/2019 11:02:26 [name] ([id number]) -------------------------------------------------------------------------------- Multi Wound Chart Details Patient Name: [name] Date of Service: 07/12/2019 10:45 AM Medical Record Number: [id number] Patient Account Number: [id number] Date of Birth/Sex: [birth year] (74 y.o. F) Treating RN: Cornell Barman Primary Care Wilkes Potvin: Vicenta Aly Other Clinician: Referring Christan Ciccarelli: Vicenta Aly Treating Nyheim Seufert/Extender: Tito Dine in Treatment: 6 Vital Signs Height(in): 64 Pulse(bpm): 43 Weight(lbs): 230 Blood Pressure(mmHg): 138/55 Body Mass Index(BMI): 39 Temperature(F): 98.4 Respiratory Rate(breaths/min): 16 Photos: [N/A:N/A] Wound Location: Left, Plantar Foot Left, Plantar Toe Great  N/A Wounding Event: Gradually Appeared Gradually Appeared N/A Primary Etiology: Diabetic Wound/Ulcer of the Lower Diabetic Wound/Ulcer of the Lower N/A Extremity Extremity Comorbid History: Cataracts, Hypertension, Type II Cataracts, Hypertension, Type II N/A Diabetes, Osteoarthritis Diabetes, Osteoarthritis Date Acquired: 05/09/2019 05/09/2019 N/A Weeks of Treatment: 6 6 N/A Wound Status: Open Open N/A Measurements L x W x D (cm) 0.2x0.4x2 0.2x0.2x0.2 N/A Area (cm) : 0.063 0.031 N/A Volume (cm) : 0.126 0.006 N/A % Reduction in Area: 77.70% 84.20% N/A % Reduction in Volume: 82.90% 70.00% N/A Classification: Grade 2 Grade 1 N/A Exudate Amount: Large Medium N/A Exudate Type: Serous Serosanguineous N/A Exudate Color:  amber red, brown N/A Wound Margin: Flat and Intact Flat and Intact N/A Granulation Amount: Large (67-100%) Large (67-100%) N/A Granulation Quality: Pink Red N/A Necrotic Amount: Small (1-33%) None Present (0%) N/A Exposed Structures: Fat Layer (Subcutaneous Tissue) Fat Layer (Subcutaneous Tissue) N/A Exposed: Yes Exposed: Yes Fascia: No Fascia: No Tendon: No Tendon: No Muscle: No Muscle: No Joint: No Joint: No Bone: No Bone: No Epithelialization: None None N/A Debridement: Debridement - Excisional Debridement - Excisional N/A Pre-procedure Verification/Time 11:10 11:10 N/A Out Taken: Pain Control: Lidocaine Lidocaine N/A Tissue Debrided: Bone, Subcutaneous Callus, Subcutaneous, Slough N/A Level: Skin/Subcutaneous Skin/Subcutaneous Tissue N/A Tissue/Muscle/Bone Debridement Area (sq cm): 0.08 0.04 N/A Renisha, Cockrum Keita L. ([id number]) Instrument: Curette Curette N/A Bleeding: Moderate Moderate N/A Hemostasis Achieved: Silver Nitrate Pressure N/A Debridement Treatment Procedure was tolerated well Procedure was tolerated well N/A Response: Post Debridement Measurements 0.2x0.4x2 0.2x0.2x0.2 N/A L x W x D (cm) Post Debridement Volume: (cm) 0.126 0.006  N/A Procedures Performed: Debridement Debridement N/A Treatment Notes Wound #1 (Left, Plantar Foot) Notes silver cell, ABD, conform Wound #2 (Left, Plantar Toe Great) Notes silver cell, ABD, conform Electronic Signature(s) Signed: 07/13/2019 7:42:01 AM By: Linton Ham MD Entered By: Linton Ham on 07/12/2019 11:54:32 [name] ([id number]) -------------------------------------------------------------------------------- [place] Details Patient Name: OMESHA, BOWERMAN L. Date of Service: 07/12/2019 10:45 AM Medical Record Number: [id number] Patient Account Number: [id number] Date of Birth/Sex: 02/11/1946 (74 y.o. F) Treating RN: Cornell Barman Primary Care Feige Lowdermilk: Vicenta Aly Other Clinician: Referring Maday Guarino: Vicenta Aly Treating Alanee Ting/Extender: Tito Dine in Treatment: 6 Active Inactive Necrotic Tissue Nursing Diagnoses: Impaired tissue integrity related to necrotic/devitalized tissue Goals: Necrotic/devitalized tissue will be minimized in the wound bed Date Initiated: 05/31/2019 Target Resolution Date: 06/14/2019 Goal Status: Active Patient/caregiver will verbalize understanding of reason and process for debridement of necrotic tissue Date Initiated: 05/31/2019 Target Resolution Date: 06/14/2019 Goal Status: Active Interventions: Assess patient pain level pre-, during and post procedure and prior to discharge Treatment Activities: Apply topical anesthetic as ordered : 05/31/2019 Excisional debridement : 05/31/2019 Notes: Pain, Acute or Chronic Nursing Diagnoses: Pain, acute or chronic: actual or potential Goals: Patient/caregiver will verbalize comfort level met Date Initiated: 05/31/2019 Target Resolution Date: 06/21/2019 Goal Status: Active Interventions: Assess comfort goal upon admission Treatment Activities: Administer pain control measures as ordered : 05/31/2019 Notes: Peripheral Neuropathy Nursing  Diagnoses: Knowledge deficit related to disease process and management of peripheral neurovascular dysfunction Goals: Patient/caregiver will verbalize understanding of disease process and disease management Date Initiated: 05/31/2019 Target Resolution Date: 06/14/2019 Goal Status: Active NELLENE, COURTOIS ([id number]) Interventions: Assess signs and symptoms of neuropathy upon admission and as needed Notes: Wound/Skin Impairment Nursing Diagnoses: Impaired tissue integrity Goals: Patient/caregiver will verbalize understanding of skin care regimen Date Initiated: 05/31/2019 Target Resolution Date: 06/14/2019 Goal Status: Active Ulcer/skin breakdown will have a volume reduction of 30% by week 4 Date Initiated: 05/31/2019 Target Resolution Date: 07/01/2019 Goal Status: Active Interventions: Assess patient/caregiver ability to obtain necessary supplies Provide education on ulcer and skin care Notes: Electronic Signature(s) Signed: 07/13/2019 5:10:21 PM By: Gretta Cool, BSN, RN, CWS, Kim RN, BSN Entered By: Gretta Cool, BSN, RN, CWS, Kim on 07/12/2019 11:15:13 [name] ([id number]) -------------------------------------------------------------------------------- Pain Assessment Details Patient Name: MILIANA, GANGWER L. Date of Service: 07/12/2019 10:45 AM Medical Record Number: [id number] Patient Account Number: [id number] Date of Birth/Sex: 09-30-1945 (74 y.o. F) Treating RN: Montey Hora Primary Care Timothy Townsel: Vicenta Aly Other Clinician: Referring Waldon Sheerin: Vicenta Aly Treating Ellenor Wisniewski/Extender: Tito Dine in Treatment: 6 Active  Problems Location of Pain Severity and Description of Pain Patient Has Paino Yes Site Locations Pain Location: Pain in Ulcers With Dressing Change: Yes Duration of the Pain. Constant / Intermittento Constant Pain Management and Medication Current Pain Management: Electronic Signature(s) Signed: 07/12/2019 4:33:58 PM By: Montey Hora Entered By: Montey Hora on 07/12/2019 10:54:38 [name] ([id number]) -------------------------------------------------------------------------------- Patient/Caregiver Education Details Patient Name: [name]. Date of Service: 07/12/2019 10:45 AM Medical Record Number: [id number] Patient Account Number: [id number] Date of Birth/Gender: [birth year] (74 y.o. F) Treating RN: Cornell Barman Primary Care Physician: Vicenta Aly Other Clinician: Referring Physician: Vicenta Aly Treating Physician/Extender: Tito Dine in Treatment: 6 Education Assessment Education Provided To: Patient Education Topics Provided Wound/Skin Impairment: Handouts: Caring for Your Ulcer, Other: wound care as prescribed Methods: Demonstration, Explain/Verbal Responses: State content correctly Electronic Signature(s) Signed: 07/13/2019 5:10:21 PM By: Gretta Cool, BSN, RN, CWS, Kim RN, BSN Entered By: Gretta Cool, BSN, RN, CWS, Kim on 07/12/2019 11:20:08 [name] ([id number]) -------------------------------------------------------------------------------- Wound Assessment Details Patient Name: SYLA, DEVOSS L. Date of Service: 07/12/2019 10:45 AM Medical Record Number: [id number] Patient Account Number: [id number] Date of Birth/Sex: [birth year] (74 y.o. F) Treating RN: Montey Hora Primary Care Tamekia Rotter: Vicenta Aly Other Clinician: Referring Estefano Victory: Vicenta Aly Treating Alyssamae Klinck/Extender: Tito Dine in Treatment: 6 Wound Status Wound Number: 1 Primary Etiology: Diabetic Wound/Ulcer of the Lower Extremity Wound Location: Left, Plantar Foot Wound Status: Open Wounding Event: Gradually Appeared Comorbid Cataracts, Hypertension, Type II Diabetes, History: Osteoarthritis Date Acquired: 05/09/2019 Weeks Of Treatment: 6 Clustered Wound: No Photos Wound Measurements Length: (cm) 0.2 % Width: (cm) 0.4 % Depth: (cm) 2 E Area: (cm) 0.063 Volume: (cm)  0.126 Reduction in Area: 77.7% Reduction in Volume: 82.9% pithelialization: None Tunneling: No Undermining: No Wound Description Classification: Grade 2 F Wound Margin: Flat and Intact S Exudate Amount: Large Exudate Type: Serous Exudate Color: amber oul Odor After Cleansing: No lough/Fibrino Yes Wound Bed Granulation Amount: Large (67-100%) Exposed Structure Granulation Quality: Pink Fascia Exposed: No Necrotic Amount: Small (1-33%) Fat Layer (Subcutaneous Tissue) Exposed: Yes Necrotic Quality: Adherent Slough Tendon Exposed: No Muscle Exposed: No Joint Exposed: No Bone Exposed: No Treatment Notes Wound #1 (Left, Plantar Foot) Notes silver cell, ABD, conform Electronic Signature(s) Signed: 07/12/2019 4:33:58 PM By: Sherre Poot, Estanislado Spire ([id number]) Entered By: Montey Hora on 07/12/2019 11:01:45 [name] ([id number]) -------------------------------------------------------------------------------- Wound Assessment Details Patient Name: FURNO, Shanice L. Date of Service: 07/12/2019 10:45 AM Medical Record Number: [id number] Patient Account Number: [id number] Date of Birth/Sex: [birth year] (74 y.o. F) Treating RN: Montey Hora Primary Care Mccauley Diehl: Vicenta Aly Other Clinician: Referring Darleth Eustache: Vicenta Aly Treating Ben Sanz/Extender: Tito Dine in Treatment: 6 Wound Status Wound Number: 2 Primary Etiology: Diabetic Wound/Ulcer of the Lower Extremity Wound Location: Left, Plantar Toe Great Wound Status: Open Wounding Event: Gradually Appeared Comorbid Cataracts, Hypertension, Type II Diabetes, History: Osteoarthritis Date Acquired: 05/09/2019 Weeks Of Treatment: 6 Clustered Wound: No Photos Wound Measurements Length: (cm) 0.2 % Width: (cm) 0.2 % Depth: (cm) 0.2 E Area: (cm) 0.031 Volume: (cm) 0.006 Reduction in Area: 84.2% Reduction in Volume: 70% pithelialization: None Tunneling: No Undermining: No Wound  Description Classification: Grade 1 Wound Margin: Flat and Intact Exudate Amount: Medium Exudate Type: Serosanguineous Exudate Color: red, brown Foul Odor After Cleansing: No Slough/Fibrino Yes Wound Bed Granulation Amount: Large (67-100%) Exposed Structure Granulation Quality: Red Fascia Exposed: No Necrotic Amount: None Present (0%) Fat Layer (Subcutaneous Tissue) Exposed: Yes Tendon Exposed: No Muscle Exposed: No Joint  Exposed: No Bone Exposed: No Treatment Notes Wound #2 (Left, Plantar Toe Great) Notes silver cell, ABD, conform Electronic Signature(s) Signed: 07/12/2019 4:33:58 PM By: Sherre Poot, Estanislado Spire ([id number]) Entered By: Montey Hora on 07/12/2019 11:02:10 [name] ([id number]) -------------------------------------------------------------------------------- Vitals Details Patient Name: [name] Date of Service: 07/12/2019 10:45 AM Medical Record Number: [id number] Patient Account Number: [id number] Date of Birth/Sex: December 11, 1945 (74 y.o. F) Treating RN: Montey Hora Primary Care Layni Kreamer: Vicenta Aly Other Clinician: Referring Tanyiah Laurich: Vicenta Aly Treating Nyles Mitton/Extender: Tito Dine in Treatment: 6 Vital Signs Time Taken: 10:54 Temperature (F): 98.4 Height (in): 64 Pulse (bpm): 87 Weight (lbs): 230 Respiratory Rate (breaths/min): 16 Body Mass Index (BMI): 39.5 Blood Pressure (mmHg): 138/55 Reference Range: 80 - 120 mg / dl Electronic Signature(s) Signed: 07/12/2019 4:33:58 PM By: Montey Hora Entered By: Montey Hora on 07/12/2019 10:55:46

## 2019-07-13 NOTE — Progress Notes (Signed)
GHINA, MEHRHOFF ([id number]) Visit Report for 07/12/2019 Debridement Details Patient Name: Debra [name], HEIGES. Date of Service: 07/12/2019 10:45 AM Medical Record Number: [id number] Patient Account Number: [id number] Date of Birth/Sex: [birth year] (74 y.o. F) Treating RN: Cornell Barman Primary Care Provider: Vicenta Aly Other Clinician: Referring Provider: Vicenta Aly Treating Provider/Extender: Tito Dine in Treatment: 6 Debridement Performed for Wound #1 Left,Plantar Foot Assessment: Performed By: Physician Ricard Dillon, MD Debridement Type: Debridement Severity of Tissue Pre Debridement: Fat layer exposed Level of Consciousness (Pre- Awake and Alert procedure): Pre-procedure Verification/Time Out Yes - 11:10 Taken: Pain Control: Lidocaine Total Area Debrided (L x W): 0.2 (cm) x 0.4 (cm) = 0.08 (cm) Tissue and other material debrided: Viable, Non-Viable, Bone, Subcutaneous Level: Skin/Subcutaneous Tissue/Muscle/Bone Debridement Description: Excisional Instrument: Curette Bleeding: Moderate Hemostasis Achieved: Silver Nitrate End Time: 11:15 Response to Treatment: Procedure was tolerated well Level of Consciousness (Post- Awake and Alert procedure): Post Debridement Measurements of Total Wound Length: (cm) 0.2 Width: (cm) 0.4 Depth: (cm) 2 Volume: (cm) 0.126 Character of Wound/Ulcer Post Debridement: Stable Severity of Tissue Post Debridement: Bone involvement without necrosis Post Procedure Diagnosis Same as Pre-procedure Electronic Signature(s) Signed: 07/13/2019 7:42:01 AM By: Linton Ham MD Signed: 07/13/2019 5:10:21 PM By: Gretta Cool, BSN, RN, CWS, Kim RN, BSN Entered By: Linton Ham on 07/12/2019 11:54:52 [place], Estanislado Spire ([id number]) -------------------------------------------------------------------------------- Debridement Details Patient Name: Debra [name], [name] Date of Service: 07/12/2019 10:45 AM Medical Record Number:  [id number] Patient Account Number: [id number] Date of Birth/Sex: 01-Nov-1945 (74 y.o. F) Treating RN: Cornell Barman Primary Care Provider: Vicenta Aly Other Clinician: Referring Provider: Vicenta Aly Treating Provider/Extender: Tito Dine in Treatment: 6 Debridement Performed for Wound #2 Left,Plantar Toe Great Assessment: Performed By: Physician Ricard Dillon, MD Debridement Type: Debridement Severity of Tissue Pre Debridement: Fat layer exposed Level of Consciousness (Pre- Awake and Alert procedure): Pre-procedure Verification/Time Out Yes - 11:10 Taken: Start Time: 11:10 Pain Control: Lidocaine Total Area Debrided (L x W): 0.2 (cm) x 0.2 (cm) = 0.04 (cm) Tissue and other material debrided: Viable, Non-Viable, Callus, Slough, Subcutaneous, Slough Level: Skin/Subcutaneous Tissue Debridement Description: Excisional Instrument: Curette Bleeding: Moderate Hemostasis Achieved: Pressure End Time: 11:15 Response to Treatment: Procedure was tolerated well Level of Consciousness (Post- Awake and Alert procedure): Post Debridement Measurements of Total Wound Length: (cm) 0.2 Width: (cm) 0.2 Depth: (cm) 0.2 Volume: (cm) 0.006 Character of Wound/Ulcer Post Debridement: Stable Severity of Tissue Post Debridement: Fat layer exposed Post Procedure Diagnosis Same as Pre-procedure Electronic Signature(s) Signed: 07/13/2019 7:42:01 AM By: Linton Ham MD Signed: 07/13/2019 5:10:21 PM By: Gretta Cool, BSN, RN, CWS, Kim RN, BSN Entered By: Linton Ham on 07/12/2019 11:55:05 Debra [name] ([id number]) -------------------------------------------------------------------------------- HPI Details Patient Name: Debra [name], [name] Date of Service: 07/12/2019 10:45 AM Medical Record Number: [id number] Patient Account Number: [id number] Date of Birth/Sex: 1945-06-05 (74 y.o. 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 She tells me she is had a wound on the plantar aspect of her left great toe since August. There was swelling and redness and she was diagnosed with cellulitis. For a period of time she was on 2 different antibiotics 1 for a UTI and 1 for the toe and things actually got better. As far as I'm aware she has not had  cultures or x-rays of the area. Although she states she has arterial disease the ABI in the right was 1.03 in our clinic on the left 1.08. I'm not certain how she is dressing this currently although she is certainly not offloading. She also has more recently developed a weeping wound on the left anterior leg and 2 small areas on the right anterior leg. She probably has chronic venous insufficiency with inflammation bilaterally. I suspect she also has development of a Charcot foot on the left 01/25/15; she still has a denuded blister on the right leg there is no open area on the left leg. On her left great toe plantar aspect the wound required a surgical debridement to remove skin nonviable subcutaneous tissue post debridement this actually cleans up quite nicely there is no overt infection here no evidence of soft tissue infection or ischemia 02/01/15; the patient arrives with poorly controlled edema bilateral legs. She has a open wound on the right leg. She has not been wearing the healing sagittals and therefore the left plantar toe wound is not better with surrounding callus Readmission: 09/07/18 on evaluation today patient actually presents for initial inspection our clinic concerning issues that she's been having with her left great toe along with the bilateral lower extremities. The lower Trinity ulcers appear to be a combination of Venus and  diabetic and the great toe ulcer is a diabetic neuropathy type ulcer. Unfortunately she's had these for quite some time in regard to the toe and the right lower extremity left lower Trinity ulcers she tells me have appeared in the past week. There's no sign of infection at any site which is good news. In regard to the toe ulcer I see no evidence of bone exposure which is good news. There was some question as to whether or not bone was exposed when she saw her primary care provider. The patient does have a history as well of hypertension. 09/14/18 on evaluation today patient's wound to lower extremity actually appear to be doing excellent. In fact the biggest thing I see is that the alginate kind of got stuck to the wound bed's which is the main issue here. Fortunately there's no signs of active infection which is good news. No fevers, chills, nausea, or vomiting noted at this time. 09/21/18 on evaluation today patient actually appears to be doing excellent in regard to her bilateral lower extremities both are healed. In regard to her left great toe ulcer this is not completely healed at this point in fact it really does show some signs of callous buildup as well as Slough in the service wound although it's minimal. No fevers, chills, nausea, or vomiting noted at this time. 09/28/18 on evaluation today patient actually appears to be doing okay in regard to her toe ulcer. Unfortunately she does have some issues with the wound. Somewhat dry but it sounds like she is not been putting the collagen on the wound bed. She has been just utilizing a Dry gauze dressing. She states that she "couldn't find the collagen". Nonetheless I think this is why the wound bed appears to be so dry today. Obviously we do not want it to wet macerated but we also do not want it to dry. 10/12/18 on evaluation today patient appears to be doing about the same in regard to her toe ulcer. She's been tolerating the dressing changes  without complication. With that being said she did not want to feed more in the past few days she tells  me and it does show in the evidence of her lower extremity swelling at this point. Fortunately there does not appear to be any signs of active infection at this time. With that being said she's been tolerating the dressing changes without complication. No fevers, chills, nausea, or vomiting noted at this time. The7/29/2020 on evaluation today patient actually appears to be doing quite well with regard to her toe ulcer. This is showing signs of improvement and in fact wound is measuring about 60% the size it was last evaluation. This is excellent news. Overall I am very pleased with how she seems to be progressing she does tell me she is taking more breaks and trying to stay off of it as much as possible. 10/26/2018 on evaluation today patient actually appears to be doing quite well with regard to her wounds on the wound on the left great toe. She has been tolerating the dressing changes without complication. Fortunately there is no signs of active infection at this time. Overall very pleased with the progress that is been made up to this point. With that being said she seems to be stalled out at this time and I think we may need to try something a little different in order to get this to completely heal. 11/02/2018 upon evaluation today patient actually appears to be doing well with regard to her toe ulcer from the standpoint of the overall appearance. With that being said she is having some issues currently with the left lower extremity where she has a small blister that opened in the past 24 hours that does need to be addressed today as well. Fortunately there is no sign of active infection at this time which is good news. 11/09/2018 on evaluation today patient actually appears to be doing a little better in regard to her toe ulcer. She has been tolerating the dressing changes without complication.  Fortunately there is no signs of active infection at this time. No fevers, chills, nausea, vomiting, or diarrhea. Overall I am actually rather pleased with how things seem to be progressing at this time. 11/16/2018 on evaluation today patient's toe ulcer appears to be doing quite well which is good news. Fortunately there is no signs of active infection Debra [name], [name] ([id number]) which is also good news. No fevers, chills, nausea, vomiting, or diarrhea. As of this point we still have not heard anything back about the Dermagraft and whether or not this will be approved. 11/23/18 upon evaluation today patient actually appears to be doing quite well with regard to her toe ulcer all things considered. We still not gotten approval from Dermagraft as of yet. Obviously the sooner we can get this to better in my pinion nonetheless there's gonna be some need for sharp debridement today based on what I'm seeing. 11/30/2018 on evaluation today patient actually appears to be doing about the same with regard to her toe ulcer. Fortunately there is no severe evidence of callus buildup which is good news. With that being said I definitely think that she will do well with the Dermagraft which fortunately we have gotten approved at this time. There is no signs of infection so I think this is absolutely appropriate for [place] to attempt at this point. 12/07/2018 on evaluation today patient appears to be doing much better with regard to her toe ulcer at this time. Fortunately there is no signs of active infection at this point. She has been tolerating the dressing changes without complication. No fevers, chills, nausea, vomiting,  or diarrhea. I do believe the Dermagraft has been beneficial for her her wound seems to be measuring much smaller today compared to last week's evaluation this is excellent. Overall she continues to make good progress. 12/14/2018 on evaluation today patient appears to be doing well with regard to her  toe ulcer. This is showing signs of improvement which is good news. Overall I am very pleased With how things appear. There is no signs of active infection at this time. I do feel like the wound is appearing much more healthy based on what I see today. Today is Dermagraft application #3. ADMISSION 05/31/2019 The patient's previous records from our office in [place] are included above. The patient is here for a deep probing wound on the plantar left heel. 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 It would appear that this started sometime in December although the details are vague. She was in urgent care on 04/14/2019 with what was felt to be a wound infection in the left foot there was noted to be drainage and odor. She was put on doxycycline and x-ray at that time showed no osteomyelitis. She was back in the ER on 04/22/2019 with increasing swelling and redness on the plantar foot. There is a picture in epic to reflect this really looked quite impressive. She adamantly refused admission. She was given clindamycin again another x-ray was negative but noted soft tissue swelling. The patient was largely in [place] from June to September with a wound on the left plantar toe. Dermagraft was tried currently she was discharged on healed when the patient's insurance stopped paying for her to come there. She dropped out of the clinic at her own insistence. The patient is not on any current antibiotics. I am not clear how she is dressing this. The area on the left great toe has eschared over Past medical history; type 2 diabetes with neuropathy, hypertension, chronic left toe ulcer, history of small bowel obstruction secondary to hernia status post ventral hernia repair ABIs in our clinic were 1.12 on the left. The patient had formal arterial studies in October 2016 at that point her ABI was 1.2 TBI at 1.02 on the right triphasic waveforms. On the left ABI at  1.24 TBI was not done probably because of bandaging triphasic waveforms 06/07/2019; the patient has a new wound this week on the plantar part of her left first toe. This had eschar on it last week I did not debride this. Culture of the area last week showed group B strep and a few Morganella morganii. The latter would not be covered by the empiric doxycycline I gave her. We use silver alginate and the heel wound which I think the patient is changing on her own We also have problems with the MRI. The patient arrived in the clinic saying her insurance that she already had an MRI and therefore would not pay for it. We looked through epic in [place] link. There is no MRI in either area. She had to negative plain x-rays for osteomyelitis which does not preclude considering an MRI in this diabetic woman with a wound that probes to bone 3/24; patient still has a deep probing area on the plantar aspect of her heel. Her MRI is on 06/19/2019 I would be surprised if this did not show osteomyelitis. He also has an area on her plantar toe that opened again last week. We have been using silver alginate to both  wound areas. She is completing the ciprofloxacin that I gave her. This was to cover the Morganella on her culture listed on 3/17. It also came out today that the patient has not changed her dressing since last Saturday which might account for some of the odor and the maceration around the wound. She is managing her own dressings at home and I would be surprised if she was exactly doing this properly even applying the dressing level on the frequency. We may need to spend a little more time with her next visit 06/23/2019 upon evaluation today patient presents for reevaluation regarding her heel ulcer. We actually did have her MRI which was reviewed today during her evaluation. Subsequently it does appear that the patient may have some slight irregularity of the heel which included marrow edema of the plantar  calcaneus towards the lateral side and this is likely a small focus of osteomyelitis early. In light of the fact that the patient has an ulcer here that probes all the way down to the bone area pretty much in the location described. There was also the obvious wound noted of her heel. There did not appear to be any finding consistent with osteomyelitis of the toe where she has a longstanding ulcer at this site as well. I have not seen this patient upon readmission here to [facility] as I previously saw her mainly in [place] but the toe appears to be doing much better to me today compared to where it used to be. Overall her heel however it is the more significant issue here. 4/7; the patient's MRI surprisingly did not show definitive osteomyelitis. It did show a small underlying fluid collection compatible with an abscess. Very small focus of marrow edema in the plantar calcaneus towards the lateral side could be a small focus of osteomyelitis but might really represent reactive change. She is currently on ciprofloxacin 500 twice daily for 2 weeks prescribed last week by Jeri Cos. She has been using silver alginate to both wounds on the plantar heel and on the plantar left great toe. The left great toe wound appears superficial. She is still complaining of some discomfort around the heel area. She is doing her best to offload this 4/14; the patient has her infectious disease appointment on 4/19. She is still on oral ciprofloxacin which will finish after Thursday's doses I am not going to renew this for now. I suspect she will require IV antibiotics for what was a deep soft tissue infection in her left heel. This has not gotten a lot better small wound with considerable depth probing to bone. There is also considerable undermining at the base of the heel the wound on her left great toe is Debra [name], DADE. ([id number]) superficial and looks just about closed again 4/21; patient was seen by Dr. Drucilla Schmidt  on 4/19 of infectious disease. He suggested an attempt at bone culture either by [place] or referral to orthopedic surgery. He gave [place] suggestions about using bio available antibiotics in the absence of a specific target such as doxycycline and Levaquin or Bactrim and Levaquin. They are also checking her inflammatory markers I have not looked at these today. We have been using silver alginate to her wounds Electronic Signature(s) Signed: 07/13/2019 7:42:01 AM By: Linton Ham MD Entered By: Linton Ham on 07/12/2019 11:56:53 Debra [name] ([id number]) -------------------------------------------------------------------------------- Physical Exam Details Patient Name: Debra [name], [name] Date of Service: 07/12/2019 10:45 AM Medical Record Number: [id number] Patient Account Number: [id number] Date of Birth/Sex: 1945-10-26 (  74 y.o. F) Treating RN: Cornell Barman Primary Care Provider: Vicenta Aly Other Clinician: Referring Provider: Vicenta Aly Treating Provider/Extender: Ricard Dillon Weeks in Treatment: 6 Notes Wound exam; Charcot foot on the left. The area on the left plantar toe required debridement of skin and subcutaneous tissue. Had a deep underlying area that is now more available and looks healthy. oThe real problem here is on the heel where she has a deep probing area that goes precariously close to bone but not right on it. The wound surface area is also very small. Using a #3 curette I went through some baseline tissue at the base of this wound and did a bone scraping for culture. I do not think it would be possible to do anything more than that in this clinic Electronic Signature(s) Signed: 07/13/2019 7:42:01 AM By: Linton Ham MD Entered By: Linton Ham on 07/12/2019 12:01:12 Debra [name] ([id number]) -------------------------------------------------------------------------------- Physician Orders Details Patient Name: Debra [name], [name] Date of Service:  07/12/2019 10:45 AM Medical Record Number: [id number] Patient Account Number: [id number] Date of Birth/Sex: 01/10/1946 (74 y.o. F) Treating RN: Cornell Barman Primary Care Provider: Vicenta Aly Other Clinician: Referring Provider: Vicenta Aly Treating Provider/Extender: Tito Dine in Treatment: 6 Verbal / Phone Orders: No Diagnosis Coding Wound Cleansing Wound #1 Left,Plantar Foot o May shower with protection. - Do not get dressing wet. o No tub bath. Wound #2 Left,Plantar Toe Great o May shower with protection. - Do not get dressing wet. o No tub bath. Anesthetic (add to Medication List) Wound #1 Left,Plantar Foot o Topical Lidocaine 4% cream applied to wound bed prior to debridement (In Clinic Only). Wound #2 Left,Plantar Toe Great o Topical Lidocaine 4% cream applied to wound bed prior to debridement (In Clinic Only). Primary Wound Dressing Wound #1 Left,Plantar Foot o Silver Alginate - packed lightly into wound Wound #2 Left,Plantar Toe Great o Silver Alginate Secondary Dressing Wound #1 Left,Plantar Foot o ABD and Kerlix/Conform Wound #2 Left,Plantar Toe Great o Other - Coverlet Dressing Change Frequency Wound #1 Left,Plantar Foot o Change Dressing Monday, Wednesday, Friday - Or more often if wet. Wound #2 Left,Plantar Toe Great o Change Dressing Monday, Wednesday, Friday - Or more often if wet. Follow-up Appointments Wound #1 Left,Plantar Foot o Return Appointment in 1 week. Wound #2 Left,Plantar Toe Great o Return Appointment in 1 week. Edema Control Wound #1 Left,Plantar Foot o Elevate legs to the level of the heart and pump ankles as often as possible Wound #2 Left,Plantar Toe Great o Elevate legs to the level of the heart and pump ankles as often as possible Off-Loading Debra [name], [name] ([id number]) Wound #1 Left,Plantar Foot o Heel suspension boot Wound #2 Left,Plantar Toe Great o Heel suspension  boot Additional Orders / Instructions Wound #1 Left,Plantar Foot o Increase protein intake. Wound #2 Left,Plantar Toe Great o Increase protein intake. Medications-please add to medication list. Wound #2 Left,Plantar Toe Great o P.O. Antibiotics - complete antibiotics Laboratory o Bacteria identified in Wound by Culture (MICRO) - bone left heel oooo LOINC Code: [id number] oooo Convenience Name: Wound culture routine Electronic Signature(s) Signed: 07/13/2019 7:42:01 AM By: Linton Ham MD Signed: 07/13/2019 5:10:21 PM By: Gretta Cool, BSN, RN, CWS, Kim RN, BSN Entered By: Gretta Cool, BSN, RN, CWS, Kim on 07/12/2019 11:19:17 Debra [name] ([id number]) -------------------------------------------------------------------------------- Problem List Details Patient Name: Debra [name], [name] Date of Service: 07/12/2019 10:45 AM Medical Record Number: [id number] Patient Account Number: [id number] Date of Birth/Sex: 1945/06/28 (74 y.o. 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 with necrosis of muscle 05/31/2019 No Yes L97.522 Non-pressure chronic ulcer of other part of left foot with fat layer exposed 06/07/2019 No Yes L02.612 Cutaneous abscess of left foot 06/28/2019 No Yes E11.42 Type 2 diabetes mellitus with diabetic polyneuropathy 05/31/2019 No Yes M14.672 Charcot's joint, left ankle and foot 05/31/2019 No Yes Inactive Problems Resolved Problems Electronic Signature(s) Signed: 07/13/2019 7:42:01 AM By: Linton Ham MD Entered By: Linton Ham on 07/12/2019 11:53:29 Debra [name], Estanislado Spire  ([id number]) -------------------------------------------------------------------------------- Progress Note Details Patient Name: [name] Date of Service: 07/12/2019 10:45 AM Medical Record Number: [id number] Patient Account Number: [id number] Date of Birth/Sex: 23-Feb-1946 (74 y.o. 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 She tells me she is had a wound on the plantar aspect of her left great toe since August. There was swelling and redness and she was diagnosed with cellulitis. For a period of time she was on 2 different antibiotics 1 for a UTI and 1 for the toe and things actually got better. As far as I'm aware she has not had cultures or x-rays of the area. Although she states she has arterial disease the ABI in the right was 1.03 in our clinic on the left 1.08. I'm not certain how she is dressing this currently although she is certainly not offloading. She also has more recently developed a weeping wound on the left anterior leg and 2 small areas on the right anterior leg. She probably has chronic venous insufficiency with inflammation bilaterally. I suspect she also has development of a Charcot foot on the left 01/25/15; she still has a denuded blister on the right leg there is no open area on the left leg. On her left great toe plantar aspect the wound required a surgical debridement to remove skin nonviable subcutaneous tissue post debridement this actually cleans up quite nicely there is no overt infection here no evidence of soft tissue infection or ischemia 02/01/15; the patient arrives with poorly controlled edema bilateral legs. She has a open wound on the right leg. She has not been  wearing the healing sagittals and therefore the left plantar toe wound is not better with surrounding callus Readmission: 09/07/18 on evaluation today patient actually presents for initial inspection our clinic concerning issues that she's been having with her left great toe along with the bilateral lower extremities. The lower Trinity ulcers appear to be a combination of Venus and diabetic and the great toe ulcer is a diabetic neuropathy type ulcer. Unfortunately she's had these for quite some time in regard to the toe and the right lower extremity left lower Trinity ulcers she tells me have appeared in the past week. There's no sign of infection at any site which is good news. In regard to the toe ulcer I see no evidence of bone exposure which is good news. There was some question as to whether or not bone was exposed when she saw her primary care provider. The patient does have a  history as well of hypertension. 09/14/18 on evaluation today patient's wound to lower extremity actually appear to be doing excellent. In fact the biggest thing I see is that the alginate kind of got stuck to the wound bed's which is the main issue here. Fortunately there's no signs of active infection which is good news. No fevers, chills, nausea, or vomiting noted at this time. 09/21/18 on evaluation today patient actually appears to be doing excellent in regard to her bilateral lower extremities both are healed. In regard to her left great toe ulcer this is not completely healed at this point in fact it really does show some signs of callous buildup as well as Slough in the service wound although it's minimal. No fevers, chills, nausea, or vomiting noted at this time. 09/28/18 on evaluation today patient actually appears to be doing okay in regard to her toe ulcer. Unfortunately she does have some issues with the wound. Somewhat dry but it sounds like she is not been putting the collagen on the wound bed. She has been just  utilizing a Dry gauze dressing. She states that she "couldn't find the collagen". Nonetheless I think this is why the wound bed appears to be so dry today. Obviously we do not want it to wet macerated but we also do not want it to dry. 10/12/18 on evaluation today patient appears to be doing about the same in regard to her toe ulcer. She's been tolerating the dressing changes without complication. With that being said she did not want to feed more in the past few days she tells me and it does show in the evidence of her lower extremity swelling at this point. Fortunately there does not appear to be any signs of active infection at this time. With that being said she's been tolerating the dressing changes without complication. No fevers, chills, nausea, or vomiting noted at this time. The7/29/2020 on evaluation today patient actually appears to be doing quite well with regard to her toe ulcer. This is showing signs of improvement and in fact wound is measuring about 60% the size it was last evaluation. This is excellent news. Overall I am very pleased with how she seems to be progressing she does tell me she is taking more breaks and trying to stay off of it as much as possible. 10/26/2018 on evaluation today patient actually appears to be doing quite well with regard to her wounds on the wound on the left great toe. She has been tolerating the dressing changes without complication. Fortunately there is no signs of active infection at this time. Overall very pleased with the progress that is been made up to this point. With that being said she seems to be stalled out at this time and I think we may need to try something a little different in order to get this to completely heal. 11/02/2018 upon evaluation today patient actually appears to be doing well with regard to her toe ulcer from the standpoint of the overall appearance. With that being said she is having some issues currently with the left lower  extremity where she has a small blister that opened in the past 24 hours that does need to be addressed today as well. Fortunately there is no sign of active infection at this time which is good news. 11/09/2018 on evaluation today patient actually appears to be doing a little better in regard to her toe ulcer. She has been tolerating the dressing changes without complication. Fortunately there is  no signs of active infection at this time. No fevers, chills, nausea, vomiting, or diarrhea. Overall I am actually rather pleased with how things seem to be progressing at this time. 11/16/2018 on evaluation today patient's toe ulcer appears to be doing quite well which is good news. Fortunately there is no signs of active infection Debra [name], [name] ([id number]) which is also good news. No fevers, chills, nausea, vomiting, or diarrhea. As of this point we still have not heard anything back about the Dermagraft and whether or not this will be approved. 11/23/18 upon evaluation today patient actually appears to be doing quite well with regard to her toe ulcer all things considered. We still not gotten approval from Dermagraft as of yet. Obviously the sooner we can get this to better in my pinion nonetheless there's gonna be some need for sharp debridement today based on what I'm seeing. 11/30/2018 on evaluation today patient actually appears to be doing about the same with regard to her toe ulcer. Fortunately there is no severe evidence of callus buildup which is good news. With that being said I definitely think that she will do well with the Dermagraft which fortunately we have gotten approved at this time. There is no signs of infection so I think this is absolutely appropriate for [place] to attempt at this point. 12/07/2018 on evaluation today patient appears to be doing much better with regard to her toe ulcer at this time. Fortunately there is no signs of active infection at this point. She has been tolerating the  dressing changes without complication. No fevers, chills, nausea, vomiting, or diarrhea. I do believe the Dermagraft has been beneficial for her her wound seems to be measuring much smaller today compared to last week's evaluation this is excellent. Overall she continues to make good progress. 12/14/2018 on evaluation today patient appears to be doing well with regard to her toe ulcer. This is showing signs of improvement which is good news. Overall I am very pleased With how things appear. There is no signs of active infection at this time. I do feel like the wound is appearing much more healthy based on what I see today. Today is Dermagraft application #3. ADMISSION 05/31/2019 The patient's previous records from our office in [place] are included above. The patient is here for a deep probing wound on the plantar left heel. 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 It would appear that this started sometime in December although the details are vague. She was in urgent care on 04/14/2019 with what was felt to be a wound infection in the left foot there was noted to be drainage and odor. She was put on doxycycline and x-ray at that time showed no osteomyelitis. She was back in the ER on 04/22/2019 with increasing swelling and redness on the plantar foot. There is a picture in epic to reflect this really looked quite impressive. She adamantly refused admission. She was given clindamycin again another x-ray was negative but noted soft tissue swelling. The patient was largely in [place] from June to September with a wound on the left plantar toe. Dermagraft was tried currently she was discharged on healed when the patient's insurance stopped paying for her to come there. She dropped out of the clinic at her own insistence. The patient is not on any current antibiotics. I am not clear how she is dressing this. The area on the left great toe has eschared  over  Past medical history; type 2 diabetes with neuropathy, hypertension, chronic left toe ulcer, history of small bowel obstruction secondary to hernia status post ventral hernia repair ABIs in our clinic were 1.12 on the left. The patient had formal arterial studies in October 2016 at that point her ABI was 1.2 TBI at 1.02 on the right triphasic waveforms. On the left ABI at 1.24 TBI was not done probably because of bandaging triphasic waveforms 06/07/2019; the patient has a new wound this week on the plantar part of her left first toe. This had eschar on it last week I did not debride this. Culture of the area last week showed group B strep and a few Morganella morganii. The latter would not be covered by the empiric doxycycline I gave her. We use silver alginate and the heel wound which I think the patient is changing on her own We also have problems with the MRI. The patient arrived in the clinic saying her insurance that she already had an MRI and therefore would not pay for it. We looked through epic in [place] link. There is no MRI in either area. She had to negative plain x-rays for osteomyelitis which does not preclude considering an MRI in this diabetic woman with a wound that probes to bone 3/24; patient still has a deep probing area on the plantar aspect of her heel. Her MRI is on 06/19/2019 I would be surprised if this did not show osteomyelitis. He also has an area on her plantar toe that opened again last week. We have been using silver alginate to both wound areas. She is completing the ciprofloxacin that I gave her. This was to cover the Morganella on her culture listed on 3/17. It also came out today that the patient has not changed her dressing since last Saturday which might account for some of the odor and the maceration around the wound. She is managing her own dressings at home and I would be surprised if she was exactly doing this properly even applying the dressing level  on the frequency. We may need to spend a little more time with her next visit 06/23/2019 upon evaluation today patient presents for reevaluation regarding her heel ulcer. We actually did have her MRI which was reviewed today during her evaluation. Subsequently it does appear that the patient may have some slight irregularity of the heel which included marrow edema of the plantar calcaneus towards the lateral side and this is likely a small focus of osteomyelitis early. In light of the fact that the patient has an ulcer here that probes all the way down to the bone area pretty much in the location described. There was also the obvious wound noted of her heel. There did not appear to be any finding consistent with osteomyelitis of the toe where she has a longstanding ulcer at this site as well. I have not seen this patient upon readmission here to [facility] as I previously saw her mainly in [place] but the toe appears to be doing much better to me today compared to where it used to be. Overall her heel however it is the more significant issue here. 4/7; the patient's MRI surprisingly did not show definitive osteomyelitis. It did show a small underlying fluid collection compatible with an abscess. Very small focus of marrow edema in the plantar calcaneus towards the lateral side could be a small focus of osteomyelitis but might really represent reactive change. She is currently on ciprofloxacin 500 twice daily for  2 weeks prescribed last week by Jeri Cos. She has been using silver alginate to both wounds on the plantar heel and on the plantar left great toe. The left great toe wound appears superficial. She is still complaining of some discomfort around the heel area. She is doing her best to offload this 4/14; the patient has her infectious disease appointment on 4/19. She is still on oral ciprofloxacin which will finish after Thursday's doses I am not going to renew this for now. I suspect she will  require IV antibiotics for what was a deep soft tissue infection in her left heel. This has not gotten a lot better small wound with considerable depth probing to bone. There is also considerable undermining at the base of the heel the wound on her left great toe is Debra [name], FEULING. ([id number]) superficial and looks just about closed again 4/21; patient was seen by Dr. Drucilla Schmidt on 4/19 of infectious disease. He suggested an attempt at bone culture either by [place] or referral to orthopedic surgery. He gave [place] suggestions about using bio available antibiotics in the absence of a specific target such as doxycycline and Levaquin or Bactrim and Levaquin. They are also checking her inflammatory markers I have not looked at these today. We have been using silver alginate to her wounds Objective Constitutional Vitals Time Taken: 10:54 AM, Height: 64 in, Weight: 230 lbs, BMI: 39.5, Temperature: 98.4 F, Pulse: 87 bpm, Respiratory Rate: 16 breaths/min, Blood Pressure: 138/55 mmHg. Integumentary (Hair, Skin) Wound #1 status is Open. Original cause of wound was Gradually Appeared. The wound is located on the [place]. The wound measures 0.2cm length x 0.4cm width x 2cm depth; 0.063cm^2 area and 0.126cm^3 volume. There is Fat Layer (Subcutaneous Tissue) Exposed exposed. There is no tunneling or undermining noted. There is a large amount of serous drainage noted. The wound margin is flat and intact. There is large (67-100%) pink granulation within the wound bed. There is a small (1-33%) amount of necrotic tissue within the wound bed including Adherent Slough. Wound #2 status is Open. Original cause of wound was Gradually Appeared. The wound is located on the [employer]. The wound measures 0.2cm length x 0.2cm width x 0.2cm depth; 0.031cm^2 area and 0.006cm^3 volume. There is Fat Layer (Subcutaneous Tissue) Exposed exposed. There is no tunneling or undermining noted. There is a medium amount of  serosanguineous drainage noted. The wound margin is flat and intact. There is large (67-100%) red granulation within the wound bed. There is no necrotic tissue within the wound bed. 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 There was a Excisional Skin/Subcutaneous Tissue/Muscle/Bone Debridement with a total area of 0.08 sq cm performed by Ricard Dillon, MD. With the following instrument(s): Curette to remove Viable and Non-Viable tissue/material. Material removed includes Bone,Subcutaneous Tissue and after achieving pain control using Lidocaine. No specimens were taken. A time out was conducted at 11:10, prior to the start of the procedure. A Moderate amount of bleeding was controlled with Silver Nitrate. The procedure was tolerated well.  Post Debridement Measurements: 0.2cm length x 0.4cm width x 2cm depth; 0.126cm^3 volume. Character of Wound/Ulcer Post Debridement is stable. Severity of Tissue Post Debridement is: Bone involvement without necrosis. Post procedure Diagnosis Wound #1: Same as Pre-Procedure Wound #2 Pre-procedure diagnosis of Wound #2 is a Diabetic Wound/Ulcer of the Lower Extremity located on the Left,Plantar Toe Great .Severity of Tissue Pre Debridement is: Fat layer exposed. There was a Excisional Skin/Subcutaneous Tissue Debridement with a total area of 0.04 sq cm performed by Debra [name] ([id number]) Ricard Dillon, MD. With the following instrument(s): Curette  to remove Viable and Non-Viable tissue/material. Material removed includes Callus, Subcutaneous Tissue, and Slough after achieving pain control using Lidocaine. No specimens were taken. A time out was conducted at 11:10, prior to the start of the procedure. A Moderate amount of bleeding was controlled with Pressure. The procedure was tolerated well. Post Debridement Measurements: 0.2cm length x 0.2cm width x 0.2cm depth; 0.006cm^3 volume. Character of Wound/Ulcer Post Debridement is stable. Severity of Tissue Post Debridement is: Fat layer exposed. Post procedure Diagnosis Wound #2: Same as Pre-Procedure Plan Wound Cleansing: Wound #1 Left,Plantar Foot: May shower with protection. - Do not get dressing wet. No tub bath. Wound #2 Left,Plantar Toe Great: May shower with protection. - Do not get dressing wet. No tub bath. Anesthetic (add to Medication List): Wound #1 Left,Plantar Foot: Topical Lidocaine 4% cream applied to wound bed prior to debridement (In Clinic Only). Wound #2 Left,Plantar Toe Great: Topical Lidocaine 4% cream applied to wound bed prior to debridement (In Clinic Only). Primary Wound Dressing: Wound #1 Left,Plantar Foot: Silver Alginate - packed lightly into wound Wound #2 Left,Plantar Toe Great: Silver Alginate Secondary Dressing: Wound #1 Left,Plantar Foot: ABD and Kerlix/Conform Wound #2 Left,Plantar Toe Great: Other - Coverlet Dressing Change Frequency: Wound #1 Left,Plantar Foot: Change Dressing Monday, Wednesday, Friday - Or more often if wet. Wound #2 Left,Plantar Toe Great: Change Dressing Monday, Wednesday, Friday - Or more often if wet. Follow-up Appointments: Wound #1 Left,Plantar Foot: Return Appointment in 1 week. Wound #2 Left,Plantar Toe Great: Return Appointment in 1 week. Edema Control: Wound #1 Left,Plantar Foot: Elevate legs to the level of the heart and pump ankles as often as possible Wound #2 Left,Plantar Toe Great: Elevate legs to the  level of the heart and pump ankles as often as possible Off-Loading: Wound #1 Left,Plantar Foot: Heel suspension boot Wound #2 Left,Plantar Toe Great: Heel suspension boot Additional Orders / Instructions: Wound #1 Left,Plantar Foot: Increase protein intake. Wound #2 Left,Plantar Toe Great: Increase protein intake. Medications-please add to medication list.: Wound #2 Left,Plantar Toe Great: P.O. Antibiotics - complete antibiotics Laboratory ordered were: Wound culture routine - bone left heel Makin, Genowefa L. ([id number]) 1. Dr. Lucianne Lei dam feels this is likely to be osteomyelitis even on the MRI was not conclusive and I agree 2. I did a bone scan raping using a #3 curette for CandS. Hemostasis with silver nitrate. The patient tolerated this well 3. I do not think that even if we have referred this patient to podiatry or orthopedics that they would likely do a surgical bone biopsy on this area for fear that even a surgical area would not heal. She has a Charcot foot and has difficulty taking the weight off this area. 4. We are using a surgical heel offloading boot. I do not know that we could do more than this although if we are able to treat her underlying infection adequately I would not be adverse to  trying a total contact cast. Not clear whether she could ambulate with this or not 5. Still using silver alginate packing to the heel and dressings to the toe Electronic Signature(s) Signed: 07/13/2019 7:42:01 AM By: Linton Ham MD Entered By: Linton Ham on 07/12/2019 12:04:49 Debra [name] ([id number]) -------------------------------------------------------------------------------- SuperBill Details Patient Name: [name] Date of Service: 07/12/2019 Medical Record Number: [id number] Patient Account Number: [id number] Date of Birth/Sex: 23-Jul-1945 (74 y.o. F) Treating RN: Cornell Barman Primary Care Provider: Vicenta Aly Other Clinician: Referring Provider: Vicenta Aly Treating Provider/Extender: Tito Dine in Treatment: 6 Diagnosis Coding ICD-10 Codes Code Description E11.621 Type 2 diabetes mellitus with foot ulcer L97.523 Non-pressure chronic ulcer of other part of left foot with necrosis of muscle L97.522 Non-pressure chronic ulcer of other part of left foot with fat layer exposed L02.612 Cutaneous abscess of left foot E11.42 Type 2 diabetes mellitus with diabetic polyneuropathy M14.672 Charcot's joint, left ankle and foot Facility Procedures CPT4 Code: [id number] Description: [id number] - DEB SUBQ TISSUE 20 SQ CM/< Modifier: 2 Quantity: 1 CPT4 Code: Description: ICD-10 Diagnosis Description L97.522 Non-pressure chronic ulcer of other part of left foot with fat layer exposed Modifier: Quantity: CPT4 Code: [id number] Description: [id number] - DEB BONE 20 SQ CM/< Modifier: Quantity: 1 CPT4 Code: Description: ICD-10 Diagnosis Description L97.523 Non-pressure chronic ulcer of other part of left foot with necrosis of muscle Modifier: Quantity: Physician Procedures CPT4: Description Modifier Quantity Code [id number] - WC PHYS SUBQ TISS 20 SQ CM 59 1 CPT4: ICD-10 Diagnosis Description L97.522 Non-pressure chronic ulcer of other part of left foot with fat layer exposed CPT4: [id number] Debridement; bone (includes epidermis, dermis, subQ tissue, muscle and/or fascia, if performed) 1st 20 sqcm 1 or less CPT4: ICD-10 Diagnosis Description L97.523 Non-pressure chronic ulcer of other part of left foot with necrosis of muscle Electronic Signature(s) Signed: 07/13/2019 7:42:01 AM By: Linton Ham MD Entered By: Linton Ham on 07/12/2019 12:05:28

## 2019-07-15 LAB — AEROBIC CULTURE W GRAM STAIN (SUPERFICIAL SPECIMEN): Gram Stain: NONE SEEN

## 2019-07-17 ENCOUNTER — Telehealth: Payer: Self-pay | Admitting: Infectious Disease

## 2019-07-17 NOTE — Telephone Encounter (Signed)
I cannot find that she has had a cephalosporin in the past.

## 2019-07-17 NOTE — Telephone Encounter (Signed)
Received message from Dr Dellia Nims re culture from scrapings donw last week showing E coli species. It  Is S to cefazolin so would use keflex 500mg  po QID   However she has sig PCN allergy listed and I cannot see that she has had cephalosporin  If we can find out she has had a cephalosporin without problems, keflex, cefdinir etc then we can go ahead or she could get a  Test dose in  A monitored setting  I am cc pharmacy as well  If that I not an option then she would have to get PICC and get iV carbapnem such as invanz

## 2019-07-18 NOTE — Telephone Encounter (Signed)
Since she has not tried a cephalosporin before, how do you want her to try this? Does she need to go to allergy/asthma?

## 2019-07-18 NOTE — Telephone Encounter (Signed)
Probably better to just go put in PICC and do IV invanz if her insurance covers it (it should given allergies) and treat for 6 weeks

## 2019-07-19 ENCOUNTER — Encounter: Payer: Medicare Other | Admitting: Internal Medicine

## 2019-07-19 ENCOUNTER — Other Ambulatory Visit: Payer: Self-pay

## 2019-07-19 ENCOUNTER — Telehealth: Payer: Self-pay | Admitting: *Deleted

## 2019-07-19 ENCOUNTER — Telehealth: Payer: Self-pay

## 2019-07-19 DIAGNOSIS — E11621 Type 2 diabetes mellitus with foot ulcer: Secondary | ICD-10-CM | POA: Diagnosis not present

## 2019-07-19 NOTE — Telephone Encounter (Signed)
From staff message:

## 2019-07-19 NOTE — Telephone Encounter (Signed)
I am not trying to put her in the hospital but her allergies and her resistance pattern of her organism DO require [place] to put in PICC. I am confused by what she is asking?

## 2019-07-19 NOTE — Telephone Encounter (Signed)
Who is notifying patient?   Please place orders for PICC placement.  Will need written home health orders and short stay first dose orders please. Landis Gandy, RN

## 2019-07-19 NOTE — Telephone Encounter (Signed)
Received call from patient expressing concerns about treatment plan. Does not want to be at the hospital. Will follow up with provider to find out what the plan is/obtain orders for PICC line + antibiotics.   Breland Trouten Lorita Officer, RN

## 2019-07-19 NOTE — Telephone Encounter (Signed)
-----   Message from Truman Hayward, MD sent at 07/19/2019  1:02 PM EDT ----- Thanks MIke I will cc my triage team. I can order PICC and IV abx or one of my colleagus in the clinic.Im in the hospital rounding next few weeks ----- Message ----- From: Ricard Dillon, MD Sent: 07/19/2019  11:38 AM EDT To: Truman Hayward, MD  Saw this patient this am. More drainage some of it purulent . Increased pain. Not systemically unwell, no erythema etc. Just wondering where we were with [facility] line/invanz. I did check with primary office, no record of cephalosporin use  Ronalee Belts

## 2019-07-19 NOTE — Progress Notes (Addendum)
Debra [name] ([id number]) Visit Report for 07/19/2019 Arrival Information Details Patient Name: Debra [name], Debra [name]. Date of Service: 07/19/2019 10:45 AM Medical Record Number: [id number] Patient Account Number: [id number] Date of Birth/Sex: 1945/07/04 (74 y.o. F) Treating RN: Army Melia Primary Care Benjamin Merrihew: Vicenta Aly Other Clinician: Referring Tarina Volk: Vicenta Aly Treating Andrej Spagnoli/Extender: Tito Dine in Treatment: 7 Visit Information History Since Last Visit Added or deleted any medications: No Patient Arrived: Walker Any new allergies or adverse reactions: No Arrival Time: 11:00 Had a fall or experienced change in No Accompanied By: brother activities of daily living that may affect Transfer Assistance: None risk of falls: Patient Identification Verified: Yes Signs or symptoms of abuse/neglect since last visito No Hospitalized since last visit: No Has Dressing in Place as Prescribed: Yes Pain Present Now: Yes Electronic Signature(s) Signed: 07/19/2019 11:09:27 AM By: Army Melia Entered By: Army Melia on 07/19/2019 11:01:07 Debra [name] ([id number]) -------------------------------------------------------------------------------- Clinic Level of Care Assessment Details Patient Name: Debra [name]. Date of Service: 07/19/2019 10:45 AM Medical Record Number: [id number] Patient Account Number: [id number] Date of Birth/Sex: 03/17/1946 (74 y.o. F) Treating RN: Cornell Barman Primary Care Yashar Inclan: Vicenta Aly Other Clinician: Referring Tranise Forrest: Vicenta Aly Treating Zakkary Thibault/Extender: Tito Dine in Treatment: 7 Clinic Level of Care Assessment Items TOOL 4 Quantity Score []  - Use when only an EandM is performed on FOLLOW-UP visit 0 ASSESSMENTS - Nursing Assessment / Reassessment X - Reassessment of Co-morbidities (includes updates in patient status) 1 10 X- 1 5 Reassessment of Adherence to Treatment Plan ASSESSMENTS - Wound  and Skin Assessment / Reassessment []  - Simple Wound Assessment / Reassessment - one wound 0 X- 2 5 Complex Wound Assessment / Reassessment - multiple wounds []  - 0 Dermatologic / Skin Assessment (not related to wound area) ASSESSMENTS - Focused Assessment []  - Circumferential Edema Measurements - multi extremities 0 []  - 0 Nutritional Assessment / Counseling / Intervention []  - 0 Lower Extremity Assessment (monofilament, tuning fork, pulses) []  - 0 Peripheral Arterial Disease Assessment (using hand held doppler) ASSESSMENTS - Ostomy and/or Continence Assessment and Care []  - Incontinence Assessment and Management 0 []  - 0 Ostomy Care Assessment and Management (repouching, etc.) PROCESS - Coordination of Care []  - Simple Patient / Family Education for ongoing care 0 X- 1 20 Complex (extensive) Patient / Family Education for ongoing care []  - 0 Staff obtains Programmer, systems, Records, Test Results / Process Orders []  - 0 Staff telephones HHA, Nursing Homes / Clarify orders / etc []  - 0 Routine Transfer to another Facility (non-emergent condition) []  - 0 Routine Hospital Admission (non-emergent condition) []  - 0 New Admissions / Biomedical engineer / Ordering NPWT, Apligraf, etc. []  - 0 Emergency Hospital Admission (emergent condition) []  - 0 Simple Discharge Coordination X- 1 15 Complex (extensive) Discharge Coordination PROCESS - Special Needs []  - Pediatric / Minor Patient Management 0 []  - 0 Isolation Patient Management []  - 0 Hearing / Language / Visual special needs []  - 0 Assessment of Community assistance (transportation, D/C planning, etc.) []  - 0 Additional assistance / Altered mentation []  - 0 Support Surface(s) Assessment (bed, cushion, seat, etc.) INTERVENTIONS - Wound Cleansing / Measurement [name], [name] ([id number]) []  - 0 Simple Wound Cleansing - one wound X- 2 5 Complex Wound Cleansing - multiple wounds X- 1 5 Wound Imaging (photographs - any  number of wounds) []  - 0 Wound Tracing (instead of photographs) []  - 0 Simple Wound Measurement - one wound X- 2 5 Complex  Wound Measurement - multiple wounds INTERVENTIONS - Wound Dressings X - Small Wound Dressing one or multiple wounds 1 10 []  - 0 Medium Wound Dressing one or multiple wounds X- 1 20 Large Wound Dressing one or multiple wounds []  - 0 Application of Medications - topical []  - 0 Application of Medications - injection INTERVENTIONS - Miscellaneous []  - External ear exam 0 []  - 0 Specimen Collection (cultures, biopsies, blood, body fluids, etc.) []  - 0 Specimen(s) / Culture(s) sent or taken to Lab for analysis []  - 0 Patient Transfer (multiple staff / Civil Service fast streamer / Similar devices) []  - 0 Simple Staple / Suture removal (25 or less) []  - 0 Complex Staple / Suture removal (26 or more) []  - 0 Hypo / Hyperglycemic Management (close monitor of Blood Glucose) []  - 0 Ankle / Brachial Index (ABI) - do not check if billed separately X- 1 5 Vital Signs Has the patient been seen at the hospital within the last three years: Yes Total Score: 120 Level Of Care: New/Established - Level 4 Electronic Signature(s) Signed: 07/19/2019 4:39:52 PM By: Gretta Cool, BSN, RN, CWS, Kim RN, BSN Entered By: Gretta Cool, BSN, RN, CWS, Kim on 07/19/2019 11:25:04 Debra [name] ([id number]) -------------------------------------------------------------------------------- Encounter Discharge Information Details Patient Name: [name], [name] Date of Service: 07/19/2019 10:45 AM Medical Record Number: [id number] Patient Account Number: [id number] Date of Birth/Sex: 27-Jan-1946 (74 y.o. F) Treating RN: Cornell Barman Primary Care Renner Sebald: Vicenta Aly Other Clinician: Referring Atom Solivan: Vicenta Aly Treating Hunter Bachar/Extender: Tito Dine in Treatment: 7 Encounter Discharge Information Items Discharge Condition: Stable Ambulatory Status: Ambulatory Discharge Destination:  Home Transportation: Private Auto Accompanied By: self Schedule Follow-up Appointment: Yes Clinical Summary of Care: Electronic Signature(s) Signed: 07/19/2019 4:39:52 PM By: Gretta Cool, BSN, RN, CWS, Kim RN, BSN Entered By: Gretta Cool, BSN, RN, CWS, Kim on 07/19/2019 11:26:31 Debra [name] ([id number]) -------------------------------------------------------------------------------- Lower Extremity Assessment Details Patient Name: WAVE, POCOCK L. Date of Service: 07/19/2019 10:45 AM Medical Record Number: [id number] Patient Account Number: [id number] Date of Birth/Sex: [birth year] (74 y.o. F) Treating RN: Army Melia Primary Care Verdia Bolt: Vicenta Aly Other Clinician: Referring Antwione Picotte: Vicenta Aly Treating Anis Cinelli/Extender: Ricard Dillon Weeks in Treatment: 7 Edema Assessment Assessed: [Left: No] [Right: No] Edema: [Left: N] [Right: o] Vascular Assessment Pulses: Dorsalis Pedis Palpable: [Left:Yes] Electronic Signature(s) Signed: 07/19/2019 11:09:27 AM By: Army Melia Entered By: Army Melia on 07/19/2019 11:07:22 Debra [name] ([id number]) -------------------------------------------------------------------------------- Multi Wound Chart Details Patient Name: Debra Devon L. Date of Service: 07/19/2019 10:45 AM Medical Record Number: [id number] Patient Account Number: [id number] Date of Birth/Sex: [birth year] (74 y.o. 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 Extremity Comorbid History: Cataracts, Hypertension, Type II Cataracts, Hypertension, Type II N/A Diabetes, Osteoarthritis Diabetes, Osteoarthritis Date Acquired: 05/09/2019 05/09/2019 N/A Weeks of Treatment: 7 7 N/A Wound Status: Open Open N/A Measurements L x W x D (cm) 1x1.2x2.5 0.1x0.1x0.1 N/A Area (cm) : 0.942 0.008 N/A Volume (cm) : 2.356 0.001 N/A % Reduction in Area: -232.90% 95.90% N/A % Reduction in Volume: -220.50% 95.00% N/A Classification: Grade 2 Grade 1 N/A Exudate Amount: Large Medium  N/A Exudate Type: Serous Serosanguineous N/A Exudate Color: amber red, brown N/A Wound Margin: Flat and Intact Flat and Intact N/A Granulation Amount: Large (67-100%) Large (67-100%) N/A Granulation Quality: Pink Red N/A Necrotic Amount: Small (1-33%) None Present (0%) N/A Exposed Structures: Fat Layer (Subcutaneous Tissue) Fat Layer (Subcutaneous Tissue) N/A Exposed: Yes Exposed: Yes Fascia: No Fascia: No Tendon: No Tendon: No Muscle: No Muscle: No Joint: No Joint: No Bone: No Bone: No Epithelialization: None None N/A Treatment Notes Electronic Signature(s) Signed: 07/19/2019 4:39:52 PM By: Gretta Cool, BSN, RN, CWS, Kim RN, BSN Entered By: Gretta Cool, BSN, RN, CWS, Kim on 07/19/2019 11:22:38 Debra [name] ([id number]) -------------------------------------------------------------------------------- [place] Details Patient Name: Debra [name], [name] Date of Service: 07/19/2019 10:45 AM Medical Record Number: [id number] Patient Account Number: [id number] Date of Birth/Sex: [birth year] (74 y.o. F) Treating RN: Cornell Barman Primary Care Norell Brisbin: Vicenta Aly Other Clinician: Referring Etola Mull: Vicenta Aly Treating Jazzman Loughmiller/Extender: Tito Dine in Treatment: 7 Active Inactive Electronic Signature(s) Signed: 07/26/2019 8:30:04 AM By: Gretta Cool, BSN, RN, CWS, Kim RN, BSN Previous Signature: 07/19/2019 4:39:52  PM Version By: Gretta Cool, BSN, RN, CWS, Kim RN, BSN Entered By: Gretta Cool, BSN, RN, CWS, Kim on 07/26/2019 08:30:04 Debra [name] ([id number]) -------------------------------------------------------------------------------- Pain Assessment Details Patient Name: Debra [name], [name] Date of Service: 07/19/2019 10:45 AM Medical Record Number: [id number] Patient Account Number: [id number] Date of Birth/Sex: February 08, 1946 (74 y.o. F) Treating RN: Army Melia Primary Care Teryl Gubler: Vicenta Aly Other Clinician: Referring Ellisa Devivo: Vicenta Aly Treating Perkins Molina/Extender: Tito Dine in Treatment: 7 Active Problems Location of Pain Severity and Description of Pain Patient Has Paino Yes Site Locations Pain Location: Pain in Ulcers Rate the pain. Current Pain Level: 10 Pain Management and Medication Current Pain Management: Electronic Signature(s) Signed: 07/19/2019 11:09:27 AM By: Army Melia Entered By: Army Melia on 07/19/2019 11:02:29 Debra [name] ([id number]) -------------------------------------------------------------------------------- Patient/Caregiver Education Details Patient Name: Debra [name]. Date of Service: 07/19/2019 10:45 AM Medical Record Number: [id number] Patient Account Number: [id number] Date of Birth/Gender: 01/20/1946 (74 y.o. F) Treating RN: Cornell Barman Primary Care Physician: Vicenta Aly Other Clinician: Referring Physician: Vicenta Aly Treating Physician/Extender: Tito Dine in Treatment: 7 Education Assessment Education Provided To: Patient Education Topics Provided Engineer, maintenance) Signed: 07/19/2019 4:39:52 PM By: Gretta Cool, BSN, RN, CWS, Kim RN, BSN Entered By: Gretta Cool, BSN, RN, CWS, Kim on 07/19/2019 11:25:36 Debra [name] ([id number]) -------------------------------------------------------------------------------- Wound Assessment Details Patient Name: Debra [name], [name] Date of Service: 07/19/2019 10:45  AM Medical Record Number: [id number] Patient Account Number: [id number] Date of Birth/Sex: 26-Aug-1945 (74 y.o. F) Treating RN: Army Melia Primary Care Krystalyn Kubota: Vicenta Aly Other Clinician: Referring Felesha Moncrieffe: Vicenta Aly Treating Oris Calmes/Extender: Tito Dine in Treatment: 7 Wound Status Wound Number: 1 Primary Etiology: Diabetic Wound/Ulcer of the Lower Extremity Wound Location: Left, Plantar Foot Wound Status: Open Wounding Event: Gradually Appeared Comorbid Cataracts, Hypertension, Type II Diabetes, History: Osteoarthritis Date Acquired: 05/09/2019 Weeks Of Treatment: 7 Clustered Wound: No Photos Wound Measurements Length: (cm) 1 Width: (cm) 1.2 Depth: (cm) 2.5 Area: (cm) 0.942 Volume: (cm) 2.356 % Reduction in Area: -232.9% % Reduction in Volume: -220.5% Epithelialization: None Wound Description Classification: Grade 2 Wound Margin: Flat and Intact Exudate Amount: Large Exudate Type: Serous Exudate Color: amber Foul Odor After Cleansing: No Slough/Fibrino Yes Wound Bed Granulation Amount: Large (67-100%) Exposed Structure Granulation Quality: Pink Fascia Exposed: No Necrotic Amount: Small (1-33%) Fat Layer (Subcutaneous Tissue) Exposed: Yes Necrotic Quality: Adherent Slough Tendon Exposed: No Muscle Exposed: No Joint Exposed:  No Bone Exposed: No Electronic Signature(s) Signed: 07/19/2019 11:09:27 AM By: Army Melia Entered By: Army Melia on 07/19/2019 11:06:46 Debra [name] ([id number]) -------------------------------------------------------------------------------- Wound Assessment Details Patient Name: Debra [name], [name] Date of Service: 07/19/2019 10:45 AM Medical Record Number: [id number] Patient Account Number: [id number] Date of Birth/Sex: 1945/03/25 (74 y.o. F) Treating RN: Cornell Barman Primary Care Rosellen Lichtenberger: Vicenta Aly Other Clinician: Referring Brecken Walth: Vicenta Aly Treating Shalah Estelle/Extender: Tito Dine in Treatment: 7 Wound Status Wound Number: 2 Primary Etiology: Diabetic Wound/Ulcer of the Lower Extremity Wound Location: Left, Plantar Toe Great Wound Status: Healed - Epithelialized Wounding Event: Gradually Appeared Comorbid Cataracts, Hypertension, Type II Diabetes, History: Osteoarthritis Date Acquired: 05/09/2019 Weeks Of Treatment: 7 Clustered Wound: No Photos Wound Measurements Length: (cm) 0 Width: (cm) 0 Depth: (cm) 0 Area: (cm) 0 Volume: (cm) 0 % Reduction in Area: 95.9% % Reduction in Volume: 95% Epithelialization: None Wound Description Classification: Grade 1 Wound Margin: Flat and Intact Exudate Amount: Medium Exudate Type: Serosanguineous Exudate Color: red, brown Foul Odor After Cleansing: No Slough/Fibrino Yes Wound Bed Granulation Amount: Large (67-100%) Exposed Structure Granulation Quality: Red Fascia Exposed: No Necrotic Amount: None Present (0%) Fat Layer (Subcutaneous Tissue) Exposed: Yes Tendon Exposed: No Muscle Exposed: No Joint Exposed: No Bone Exposed: No Treatment Notes Wound #2 (Left, Plantar Toe Great) Notes silver cell, ABD, conform Electronic Signature(s) Signed: 09/01/2019 12:59:55 PM By: Gretta Cool, BSN, RN, CWS, Kim RN, BSN Debra [name], Debra [name] ([id number]) Previous Signature: 07/19/2019 11:09:27 AM Version By: Army Melia Entered By: Gretta Cool BSN, RN, CWS, Kim on 07/26/2019 08:29:30 Debra [name] ([id number]) -------------------------------------------------------------------------------- Vitals Details Patient Name: Debra [name], [name] Date of Service: 07/19/2019 10:45 AM Medical Record Number: [id number] Patient Account Number: [id number] Date of Birth/Sex: 1945/12/17 (74 y.o. F) Treating RN: Army Melia Primary Care Halayna Blane: Vicenta Aly Other Clinician: Referring [place]: Vicenta Aly Treating Kem Parcher/Extender: Tito Dine in Treatment: 7 Vital Signs Time Taken: 11:01 Temperature (F):  97.8 Height (in): 64 Pulse (bpm): 92 Weight (lbs): 230 Respiratory Rate (breaths/min): 16 Body Mass Index (BMI): 39.5 Blood Pressure (mmHg): 122/94 Reference Range: 80 - 120 mg / dl Electronic Signature(s) Signed: 07/19/2019 11:09:27 AM By: Army Melia Entered By: Army Melia on 07/19/2019 11:01:55

## 2019-07-20 ENCOUNTER — Other Ambulatory Visit: Payer: Self-pay | Admitting: Infectious Disease

## 2019-07-20 ENCOUNTER — Telehealth: Payer: Self-pay | Admitting: *Deleted

## 2019-07-20 DIAGNOSIS — M869 Osteomyelitis, unspecified: Secondary | ICD-10-CM

## 2019-07-20 NOTE — Telephone Encounter (Signed)
RN spoke with patient, relayed Dr Lucianne Lei Dam's plan to have PICC placed and to start IV medication through a home health team.  Patient had concerns about being hospitalized, but RN let her know she would only be at [facility] to have her PICC placed and to have her first dose of antibiotics.  Otherwise, she will be administering the antibiotics herself through her PICC at home. Patient agreeable to this, but has appointments/commitments early next week.  She will not be able to get the PICC until Thursday.  Please place PICC placement order and advise on IV antibiotic, strength, dosing, end date for home health and short stay set up. Landis Gandy, RN

## 2019-07-20 NOTE — Progress Notes (Signed)
SANA, KEESEE ([id number]) Visit Report for 07/19/2019 HPI Details Patient Name: Debra [name], Debra [name]. Date of Service: 07/19/2019 10:45 AM Medical Record Number: [id number] Patient Account Number: [id number] Date of Birth/Sex: 1945/07/06 (74 y.o. 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 She tells me she is had a wound on the plantar aspect of her left great toe since August. There was swelling and redness and she was diagnosed with cellulitis. For a period of time she was on 2 different antibiotics 1 for a UTI and 1 for the toe and things actually got better. As far as I'm aware she has not had cultures or x-rays of the area. Although she states she has arterial disease the ABI in the right was 1.03 in our clinic on the left 1.08. I'm not certain how she is dressing this currently although she is certainly not offloading. She also has more recently developed a weeping wound on the left anterior leg and 2 small areas on the right anterior leg. She probably has chronic venous insufficiency with inflammation bilaterally. I suspect she also has development of a Charcot foot on the left 01/25/15; she still has a denuded blister on the right leg there is no open area on the left leg. On her left great toe plantar aspect the wound required a surgical debridement to remove skin nonviable subcutaneous tissue post debridement this actually cleans up quite nicely there is no overt infection here no evidence of soft tissue infection or ischemia 02/01/15; the patient arrives with poorly controlled edema bilateral legs. She has a open wound on the right leg. She has not been wearing the healing sagittals and therefore the  left plantar toe wound is not better with surrounding callus Readmission: 09/07/18 on evaluation today patient actually presents for initial inspection our clinic concerning issues that she's been having with her left great toe along with the bilateral lower extremities. The lower Trinity ulcers appear to be a combination of Venus and diabetic and the great toe ulcer is a diabetic neuropathy type ulcer. Unfortunately she's had these for quite some time in regard to the toe and the right lower extremity left lower Trinity ulcers she tells me have appeared in the past week. There's no sign of infection at any site which is good news. In regard to the toe ulcer I see no evidence of bone exposure which is good news. There was some question as to whether or not bone was exposed when she saw her primary care provider. The patient does have a history as well of hypertension. 09/14/18 on evaluation today patient's wound to lower extremity actually appear to be doing excellent. In fact the biggest thing I see is that the alginate kind of got stuck to the wound bed's which is the main issue here. Fortunately there's no signs of active infection which is good news. No fevers, chills, nausea, or vomiting noted at this time. 09/21/18 on evaluation today patient actually appears to be doing excellent in regard to her bilateral lower extremities both are healed. In regard to her left great toe ulcer this is not completely healed at this point in fact it really does show some signs of callous buildup as well as [place] in the service wound although it's minimal. No fevers,  chills, nausea, or vomiting noted at this time. 09/28/18 on evaluation today patient actually appears to be doing okay in regard to her toe ulcer. Unfortunately she does have some issues with the wound. Somewhat dry but it sounds like she is not been putting the collagen on the wound bed. She has been just utilizing a Dry gauze dressing. She states that  she "couldn't find the collagen". Nonetheless I think this is why the wound bed appears to be so dry today. Obviously we do not want it to wet macerated but we also do not want it to dry. 10/12/18 on evaluation today patient appears to be doing about the same in regard to her toe ulcer. She's been tolerating the dressing changes without complication. With that being said she did not want to feed more in the past few days she tells me and it does show in the evidence of her lower extremity swelling at this point. Fortunately there does not appear to be any signs of active infection at this time. With that being said she's been tolerating the dressing changes without complication. No fevers, chills, nausea, or vomiting noted at this time. The7/29/2020 on evaluation today patient actually appears to be doing quite well with regard to her toe ulcer. This is showing signs of improvement and in fact wound is measuring about 60% the size it was last evaluation. This is excellent news. Overall I am very pleased with how she seems to be progressing she does tell me she is taking more breaks and trying to stay off of it as much as possible. 10/26/2018 on evaluation today patient actually appears to be doing quite well with regard to her wounds on the wound on the left great toe. She has been tolerating the dressing changes without complication. Fortunately there is no signs of active infection at this time. Overall very pleased with the progress that is been made up to this point. With that being said she seems to be stalled out at this time and I think we may need to try something a little different in order to get this to completely heal. 11/02/2018 upon evaluation today patient actually appears to be doing well with regard to her toe ulcer from the standpoint of the overall appearance. With that being said she is having some issues currently with the left lower extremity where she has a small blister that opened  in the past 24 hours that does need to be addressed today as well. Fortunately there is no sign of active infection at this time which is good news. 11/09/2018 on evaluation today patient actually appears to be doing a little better in regard to her toe ulcer. She has been tolerating the dressing changes without complication. Fortunately there is no signs of active infection at this time. No fevers, chills, nausea, vomiting, or diarrhea. Overall I am actually rather pleased with how things seem to be progressing at this time. 11/16/2018 on evaluation today patient's toe ulcer appears to be doing quite well which is good news. Fortunately there is no signs of active infection which is also good news. No fevers, chills, nausea, vomiting, or diarrhea. As of this point we still have not heard anything back about the Dermagraft and whether or not this will be approved. Debra [name], Debra [name] ([id number]) 11/23/18 upon evaluation today patient actually appears to be doing quite well with regard to her toe ulcer all things considered. We still not gotten approval from Dermagraft as of yet. Obviously the  sooner we can get this to better in my pinion nonetheless there's gonna be some need for sharp debridement today based on what I'm seeing. 11/30/2018 on evaluation today patient actually appears to be doing about the same with regard to her toe ulcer. Fortunately there is no severe evidence of callus buildup which is good news. With that being said I definitely think that she will do well with the Dermagraft which fortunately we have gotten approved at this time. There is no signs of infection so I think this is absolutely appropriate for [place] to attempt at this point. 12/07/2018 on evaluation today patient appears to be doing much better with regard to her toe ulcer at this time. Fortunately there is no signs of active infection at this point. She has been tolerating the dressing changes without complication. No fevers,  chills, nausea, vomiting, or diarrhea. I do believe the Dermagraft has been beneficial for her her wound seems to be measuring much smaller today compared to last week's evaluation this is excellent. Overall she continues to make good progress. 12/14/2018 on evaluation today patient appears to be doing well with regard to her toe ulcer. This is showing signs of improvement which is good news. Overall I am very pleased With how things appear. There is no signs of active infection at this time. I do feel like the wound is appearing much more healthy based on what I see today. Today is Dermagraft application #3. ADMISSION 05/31/2019 The patient's previous records from our office in [place] are included above. The patient is here for a deep probing wound on the plantar left heel. 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 It would appear that this started sometime in December although the details are vague. She was in urgent care on 04/14/2019 with what was felt to be a wound infection in the left foot there was noted to be drainage and odor. She was put on doxycycline and x-ray at that time showed no osteomyelitis. She was back in the ER on 04/22/2019 with increasing swelling and redness on the plantar foot. There is a picture in epic to reflect this really looked quite impressive. She adamantly refused admission. She was given clindamycin again another x-ray was negative but noted soft tissue swelling. The patient was largely in [place] from June to September with a wound on the left plantar toe. Dermagraft was tried currently she was discharged on healed when the patient's insurance stopped paying for her to come there. She dropped out of the clinic at her own insistence. The patient is not on any current antibiotics. I am not clear how she is dressing this. The area on the left great toe has eschared over Past medical history; type 2 diabetes with  neuropathy, hypertension, chronic left toe ulcer, history of small bowel obstruction secondary to hernia status post ventral hernia repair ABIs in our clinic were 1.12 on the left. The patient had formal arterial studies in October 2016 at that point her ABI was 1.2 TBI at 1.02 on the right triphasic waveforms. On the left ABI at 1.24 TBI was not done probably because of bandaging triphasic waveforms 06/07/2019; the patient has a new wound this week on the plantar part of her left first toe. This had eschar on it last week I did not debride this. Culture of the area last week showed group B strep and a few Morganella morganii. The latter would not be covered by  the empiric doxycycline I gave her. We use silver alginate and the heel wound which I think the patient is changing on her own We also have problems with the MRI. The patient arrived in the clinic saying her insurance that she already had an MRI and therefore would not pay for it. We looked through epic in [place] link. There is no MRI in either area. She had to negative plain x-rays for osteomyelitis which does not preclude considering an MRI in this diabetic woman with a wound that probes to bone 3/24; patient still has a deep probing area on the plantar aspect of her heel. Her MRI is on 06/19/2019 I would be surprised if this did not show osteomyelitis. He also has an area on her plantar toe that opened again last week. We have been using silver alginate to both wound areas. She is completing the ciprofloxacin that I gave her. This was to cover the Morganella on her culture listed on 3/17. It also came out today that the patient has not changed her dressing since last Saturday which might account for some of the odor and the maceration around the wound. She is managing her own dressings at home and I would be surprised if she was exactly doing this properly even applying the dressing level on the frequency. We may need to spend a little  more time with her next visit 06/23/2019 upon evaluation today patient presents for reevaluation regarding her heel ulcer. We actually did have her MRI which was reviewed today during her evaluation. Subsequently it does appear that the patient may have some slight irregularity of the heel which included marrow edema of the plantar calcaneus towards the lateral side and this is likely a small focus of osteomyelitis early. In light of the fact that the patient has an ulcer here that probes all the way down to the bone area pretty much in the location described. There was also the obvious wound noted of her heel. There did not appear to be any finding consistent with osteomyelitis of the toe where she has a longstanding ulcer at this site as well. I have not seen this patient upon readmission here to [facility] as I previously saw her mainly in [place] but the toe appears to be doing much better to me today compared to where it used to be. Overall her heel however it is the more significant issue here. 4/7; the patient's MRI surprisingly did not show definitive osteomyelitis. It did show a small underlying fluid collection compatible with an abscess. Very small focus of marrow edema in the plantar calcaneus towards the lateral side could be a small focus of osteomyelitis but might really represent reactive change. She is currently on ciprofloxacin 500 twice daily for 2 weeks prescribed last week by Jeri Cos. She has been using silver alginate to both wounds on the plantar heel and on the plantar left great toe. The left great toe wound appears superficial. She is still complaining of some discomfort around the heel area. She is doing her best to offload this 4/14; the patient has her infectious disease appointment on 4/19. She is still on oral ciprofloxacin which will finish after Thursday's doses I am not going to renew this for now. I suspect she will require IV antibiotics for what was a deep soft  tissue infection in her left heel. This has not gotten a lot better small wound with considerable depth probing to bone. There is also considerable undermining at the  base of the heel the wound on her left great toe is superficial and looks just about closed again 4/21; patient was seen by Dr. Drucilla Schmidt on 4/19 of infectious disease. He suggested an attempt at bone culture either by [place] or referral to orthopedic surgery. He gave [place] suggestions about using bio available antibiotics in the absence of a specific target such as doxycycline and Levaquin or Bactrim and Levaquin. They are also checking her inflammatory markers I have not looked at these today. We have been using silver alginate to her wounds 4/28; I have been in touch with Dr. Drucilla Schmidt via secure text message 3 days ago. He let on that he was researching whether she had a cephalosporin allergy but the last communication with his nurse that I can see was from 2 days ago that she should have a PICC line and IV Invanz. The patient arrives in clinic she has not heard anything she does not have a PICC line in place. She does have increasing pain. Her doctor put her on tramadol yesterday. The wound is certainly is larger but still probes to bone surrounding soft tissue infection is beginning to be more obvious. Debra [name], Debra [name] ([id number]) The other issue is the patient is decided only to change her dressing every third day, she thought this was superfluous to do daily changes I called her primary doctor's office at [facility] Dr. Ouida Sills [phone] and spoke to her nurse. I was able to verify that she is never received cephalosporins. The patient had thought that she had previously received Keflex. I relayed this information to Dr. Drucilla Schmidt via text Electronic Signature(s) Signed: 07/19/2019 4:22:42 PM By: Linton Ham MD Entered By: Linton Ham on 07/19/2019 12:38:00 Debra [name]  ([id number]) -------------------------------------------------------------------------------- Physical Exam Details Patient Name: Debra [name], [name] Date of Service: 07/19/2019 10:45 AM Medical Record Number: [id number] Patient Account Number: [id number] Date of Birth/Sex: 27-Oct-1945 (74 y.o. F) Treating RN: Cornell Barman Primary Care Provider: Vicenta Aly Other Clinician: Referring Provider: Vicenta Aly Treating Provider/Extender: Tito Dine in Treatment: 7 Constitutional Patient is hypertensive.. Pulse regular and within target range for patient.Marland Kitchen Respirations regular, non-labored and within target range.. Temperature is normal and within the target range for the patient.Marland Kitchen appears in no distress. Cardiovascular Pedal pulses are palpable. She has lymphedema of her legs.. Notes Wound exam; the areas of punched out area on the bottom of her heel. This is certainly not any better. Probes to bone as was true last time. She is beginning to have swelling around the wound which leads me believe there is possible development of soft tissue infection she had mild purulent drainage Electronic Signature(s) Signed: 07/19/2019 4:22:42 PM By: Linton Ham MD Entered By: Linton Ham on 07/19/2019 12:36:50 Debra [name] ([id number]) -------------------------------------------------------------------------------- Physician Orders Details Patient Name: Debra [name], [name] Date of Service: 07/19/2019 10:45 AM Medical Record Number: [id number] Patient Account Number: [id number] Date of Birth/Sex: 07-Sep-1945 (74 y.o. F) Treating RN: Cornell Barman Primary Care Provider: Vicenta Aly Other Clinician: Referring Provider: Vicenta Aly Treating Provider/Extender: Tito Dine in Treatment: 7 Verbal / Phone Orders: No Diagnosis Coding Wound Cleansing Wound #1 Left,Plantar Foot o May shower with protection. - Do not get dressing wet. o No tub bath. Wound #2  Left,Plantar Toe Great o May shower with protection. - Do not get dressing wet. o No tub bath. Anesthetic (add to Medication List) Wound #1 Left,Plantar Foot o Topical Lidocaine 4% cream applied to wound bed prior to debridement (In Clinic Only).  Wound #2 Left,Plantar Toe Great o Topical Lidocaine 4% cream applied to wound bed prior to debridement (In Clinic Only). Primary Wound Dressing Wound #1 Left,Plantar Foot o Silver Alginate - packed lightly into wound Wound #2 Left,Plantar Toe Great o Silver Alginate Secondary Dressing Wound #1 Left,Plantar Foot o ABD and Kerlix/Conform Wound #2 Left,Plantar Toe Great o Other - Coverlet Dressing Change Frequency Wound #1 Left,Plantar Foot o Change dressing every day. Wound #2 Left,Plantar Toe Great o Change dressing every day. Follow-up Appointments Wound #1 Left,Plantar Foot o Return Appointment in 1 week. Wound #2 Left,Plantar Toe Great o Return Appointment in 1 week. Edema Control Wound #1 Left,Plantar Foot o Elevate legs to the level of the heart and pump ankles as often as possible Wound #2 Left,Plantar Toe Great o Elevate legs to the level of the heart and pump ankles as often as possible Off-Loading Wound #1 Left,Plantar Foot o Heel suspension boot Debra [name], [name] ([id number]) Wound #2 Left,Plantar Toe Great o Heel suspension boot Additional Orders / Instructions Wound #1 Left,Plantar Foot o Increase protein intake. Wound #2 Left,Plantar Toe Great o Increase protein intake. Medications-please add to medication list. Wound #1 Left,Plantar Foot o Other: - IV Antibiotics Wound #2 Left,Plantar Toe Great o Other: - IV Antibiotics Electronic Signature(s) Signed: 07/19/2019 4:22:42 PM By: Linton Ham MD Signed: 07/19/2019 4:39:52 PM By: Gretta Cool, BSN, RN, CWS, Kim RN, BSN Entered By: Gretta Cool, BSN, RN, CWS, Kim on 07/19/2019 11:28:31 Debra [name]  ([id number]) -------------------------------------------------------------------------------- Problem List Details Patient Name: Debra [name], [name] Date of Service: 07/19/2019 10:45 AM Medical Record Number: [id number] Patient Account Number: [id number] Date of Birth/Sex: [birth year] (74 y.o. F) Treating RN: Cornell Barman Primary Care Provider: Vicenta Aly Other Clinician: Referring Provider: Vicenta Aly Treating Provider/Extender: Tito Dine in Treatment: 7 Active Problems ICD-10 Encounter Code Description Active Date MDM Diagnosis E11.621 Type 2 diabetes mellitus with foot ulcer 05/31/2019 No Yes L97.523 Non-pressure chronic ulcer of other part of left foot with necrosis of 05/31/2019 No Yes muscle M86.472 Chronic osteomyelitis with draining sinus, left ankle and foot 07/19/2019 No Yes L97.522 Non-pressure chronic ulcer of other part of left foot with fat layer 06/07/2019 No Yes exposed L02.612 Cutaneous abscess of left foot 06/28/2019 No Yes M14.672 Charcot's joint, left ankle and foot 05/31/2019 No Yes E11.42 Type 2 diabetes mellitus with diabetic polyneuropathy 05/31/2019 No Yes Inactive Problems Resolved Problems Electronic Signature(s) Signed: 07/19/2019 4:22:42 PM By: Linton Ham MD Entered By: Linton Ham on 07/19/2019 12:42:48 Debra [name] ([id number]) -------------------------------------------------------------------------------- Progress Note Details Patient Name: Debra Devon L. Date of Service: 07/19/2019 10:45 AM Medical Record Number: [id number] Patient Account Number: [id number] Date of Birth/Sex: 12/08/1945 (74 y.o. F) Treating RN: Cornell Barman Primary Care Provider: Vicenta Aly Other Clinician: Referring Provider: Vicenta Aly Treating Provider/Extender: Tito Dine in Treatment: 7 Subjective History of Present Illness (HPI) 01/18/15:this is a patient who is a type II diabetic with neuropathy and peripheral arterial  disease. She tells me she is had a wound on the plantar aspect of her left great toe since August. There was swelling and redness and she was diagnosed with cellulitis. For a period of time she was on 2 different antibiotics 1 for a UTI and 1 for the toe and things actually got better. As far as I'm aware she has not had cultures or x-rays of the area. Although she states she has arterial disease the ABI in the right was 1.03 in our clinic on the left 1.08. I'm  not certain how she is dressing this currently although she is certainly not offloading. She also has more recently developed a weeping wound on the left anterior leg and 2 small areas on the right anterior leg. She probably has chronic venous insufficiency with inflammation bilaterally. I suspect she also has development of a Charcot foot on the left 01/25/15; she still has a denuded blister on the right leg there is no open area on the left leg. On her left great toe plantar aspect the wound required a surgical debridement to remove skin nonviable subcutaneous tissue post debridement this actually cleans up quite nicely there is no overt infection here no evidence of soft tissue infection or ischemia 02/01/15; the patient arrives with poorly controlled edema bilateral legs. She has a open wound on the right leg. She has not been wearing the healing sagittals and therefore the left plantar toe wound is not better with surrounding callus Readmission: 09/07/18 on evaluation today patient actually presents for initial inspection our clinic concerning issues that she's been having with her left great toe along with the bilateral lower extremities. The lower Trinity ulcers appear to be a combination of Venus and diabetic and the great toe ulcer is a diabetic neuropathy type ulcer. Unfortunately she's had these for quite some time in regard to the toe and the right lower extremity left lower Trinity ulcers she tells me have appeared in the past week.  There's no sign of infection at any site which is good news. In regard to the toe ulcer I see no evidence of bone exposure which is good news. There was some question as to whether or not bone was exposed when she saw her primary care provider. The patient does have a history as well of hypertension. 09/14/18 on evaluation today patient's wound to lower extremity actually appear to be doing excellent. In fact the biggest thing I see is that the alginate kind of got stuck to the wound bed's which is the main issue here. Fortunately there's no signs of active infection which is good news. No fevers, chills, nausea, or vomiting noted at this time. 09/21/18 on evaluation today patient actually appears to be doing excellent in regard to her bilateral lower extremities both are healed. In regard to her left great toe ulcer this is not completely healed at this point in fact it really does show some signs of callous buildup as well as Slough in the service wound although it's minimal. No fevers, chills, nausea, or vomiting noted at this time. 09/28/18 on evaluation today patient actually appears to be doing okay in regard to her toe ulcer. Unfortunately she does have some issues with the wound. Somewhat dry but it sounds like she is not been putting the collagen on the wound bed. She has been just utilizing a Dry gauze dressing. She states that she "couldn't find the collagen". Nonetheless I think this is why the wound bed appears to be so dry today. Obviously we do not want it to wet macerated but we also do not want it to dry. 10/12/18 on evaluation today patient appears to be doing about the same in regard to her toe ulcer. She's been tolerating the dressing changes without complication. With that being said she did not want to feed more in the past few days she tells me and it does show in the evidence of her lower extremity swelling at this point. Fortunately there does not appear to be any signs of active  infection  at this time. With that being said she's been tolerating the dressing changes without complication. No fevers, chills, nausea, or vomiting noted at this time. The7/29/2020 on evaluation today patient actually appears to be doing quite well with regard to her toe ulcer. This is showing signs of improvement and in fact wound is measuring about 60% the size it was last evaluation. This is excellent news. Overall I am very pleased with how she seems to be progressing she does tell me she is taking more breaks and trying to stay off of it as much as possible. 10/26/2018 on evaluation today patient actually appears to be doing quite well with regard to her wounds on the wound on the left great toe. She has been tolerating the dressing changes without complication. Fortunately there is no signs of active infection at this time. Overall very pleased with the progress that is been made up to this point. With that being said she seems to be stalled out at this time and I think we may need to try something a little different in order to get this to completely heal. 11/02/2018 upon evaluation today patient actually appears to be doing well with regard to her toe ulcer from the standpoint of the overall appearance. With that being said she is having some issues currently with the left lower extremity where she has a small blister that opened in the past 24 hours that does need to be addressed today as well. Fortunately there is no sign of active infection at this time which is good news. 11/09/2018 on evaluation today patient actually appears to be doing a little better in regard to her toe ulcer. She has been tolerating the dressing changes without complication. Fortunately there is no signs of active infection at this time. No fevers, chills, nausea, vomiting, or diarrhea. Overall I am actually rather pleased with how things seem to be progressing at this time. 11/16/2018 on evaluation today patient's toe  ulcer appears to be doing quite well which is good news. Fortunately there is no signs of active infection which is also good news. No fevers, chills, nausea, vomiting, or diarrhea. As of this point we still have not heard anything back about the Dermagraft and whether or not this will be approved. 11/23/18 upon evaluation today patient actually appears to be doing quite well with regard to her toe ulcer all things considered. We still not gotten approval from Dermagraft as of yet. Obviously the sooner we can get this to better in my pinion nonetheless there's gonna be some need for sharp debridement today based on what I'm seeing. Debra [name], Debra [name] ([id number]) 11/30/2018 on evaluation today patient actually appears to be doing about the same with regard to her toe ulcer. Fortunately there is no severe evidence of callus buildup which is good news. With that being said I definitely think that she will do well with the Dermagraft which fortunately we have gotten approved at this time. There is no signs of infection so I think this is absolutely appropriate for [place] to attempt at this point. 12/07/2018 on evaluation today patient appears to be doing much better with regard to her toe ulcer at this time. Fortunately there is no signs of active infection at this point. She has been tolerating the dressing changes without complication. No fevers, chills, nausea, vomiting, or diarrhea. I do believe the Dermagraft has been beneficial for her her wound seems to be measuring much smaller today compared to last week's evaluation this is  excellent. Overall she continues to make good progress. 12/14/2018 on evaluation today patient appears to be doing well with regard to her toe ulcer. This is showing signs of improvement which is good news. Overall I am very pleased With how things appear. There is no signs of active infection at this time. I do feel like the wound is appearing much more healthy based on what I see  today. Today is Dermagraft application #3. ADMISSION 05/31/2019 The patient's previous records from our office in [place] are included above. The patient is here for a deep probing wound on the plantar left heel. 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 It would appear that this started sometime in December although the details are vague. She was in urgent care on 04/14/2019 with what was felt to be a wound infection in the left foot there was noted to be drainage and odor. She was put on doxycycline and x-ray at that time showed no osteomyelitis. She was back in the ER on 04/22/2019 with increasing swelling and redness on the plantar foot. There is a picture in epic to reflect this really looked quite impressive. She adamantly refused admission. She was given clindamycin again another x-ray was negative but noted soft tissue swelling. The patient was largely in [place] from June to September with a wound on the left plantar toe. Dermagraft was tried currently she was discharged on healed when the patient's insurance stopped paying for her to come there. She dropped out of the clinic at her own insistence. The patient is not on any current antibiotics. I am not clear how she is dressing this. The area on the left great toe has eschared over Past medical history; type 2 diabetes with neuropathy, hypertension, chronic left toe ulcer, history of small bowel obstruction secondary to hernia status post ventral hernia repair ABIs in our clinic were 1.12 on the left. The patient had formal arterial studies in October 2016 at that point her ABI was 1.2 TBI at 1.02 on the right triphasic waveforms. On the left ABI at 1.24 TBI was not done probably because of bandaging triphasic waveforms 06/07/2019; the patient has a new wound this week on the plantar part of her left first toe. This had eschar on it last week I did not debride this. Culture of the area last  week showed group B strep and a few Morganella morganii. The latter would not be covered by the empiric doxycycline I gave her. We use silver alginate and the heel wound which I think the patient is changing on her own We also have problems with the MRI. The patient arrived in the clinic saying her insurance that she already had an MRI and therefore would not pay for it. We looked through epic in [place] link. There is no MRI in either area. She had to negative plain x-rays for osteomyelitis which does not preclude considering an MRI in this diabetic woman with a wound that probes to bone 3/24; patient still has a deep probing area on the plantar aspect of her heel. Her MRI is on 06/19/2019 I would be surprised if this did not show osteomyelitis. He also has an area on her plantar toe that opened again last week. We have been using silver alginate to both wound areas. She is completing the ciprofloxacin that I gave her. This was to cover the Morganella on her culture listed on 3/17. It also came out today  that the patient has not changed her dressing since last Saturday which might account for some of the odor and the maceration around the wound. She is managing her own dressings at home and I would be surprised if she was exactly doing this properly even applying the dressing level on the frequency. We may need to spend a little more time with her next visit 06/23/2019 upon evaluation today patient presents for reevaluation regarding her heel ulcer. We actually did have her MRI which was reviewed today during her evaluation. Subsequently it does appear that the patient may have some slight irregularity of the heel which included marrow edema of the plantar calcaneus towards the lateral side and this is likely a small focus of osteomyelitis early. In light of the fact that the patient has an ulcer here that probes all the way down to the bone area pretty much in the location described. There was also  the obvious wound noted of her heel. There did not appear to be any finding consistent with osteomyelitis of the toe where she has a longstanding ulcer at this site as well. I have not seen this patient upon readmission here to [facility] as I previously saw her mainly in [place] but the toe appears to be doing much better to me today compared to where it used to be. Overall her heel however it is the more significant issue here. 4/7; the patient's MRI surprisingly did not show definitive osteomyelitis. It did show a small underlying fluid collection compatible with an abscess. Very small focus of marrow edema in the plantar calcaneus towards the lateral side could be a small focus of osteomyelitis but might really represent reactive change. She is currently on ciprofloxacin 500 twice daily for 2 weeks prescribed last week by Jeri Cos. She has been using silver alginate to both wounds on the plantar heel and on the plantar left great toe. The left great toe wound appears superficial. She is still complaining of some discomfort around the heel area. She is doing her best to offload this 4/14; the patient has her infectious disease appointment on 4/19. She is still on oral ciprofloxacin which will finish after Thursday's doses I am not going to renew this for now. I suspect she will require IV antibiotics for what was a deep soft tissue infection in her left heel. This has not gotten a lot better small wound with considerable depth probing to bone. There is also considerable undermining at the base of the heel the wound on her left great toe is superficial and looks just about closed again 4/21; patient was seen by Dr. Drucilla Schmidt on 4/19 of infectious disease. He suggested an attempt at bone culture either by [place] or referral to orthopedic surgery. He gave [place] suggestions about using bio available antibiotics in the absence of a specific target such as doxycycline and Levaquin or Bactrim and Levaquin.  They are also checking her inflammatory markers I have not looked at these today. We have been using silver alginate to her wounds 4/28; I have been in touch with Dr. Drucilla Schmidt via secure text message 3 days ago. He let on that he was researching whether she had a cephalosporin allergy but the last communication with his nurse that I can see was from 2 days ago that she should have a PICC line and IV Invanz. The patient arrives in clinic she has not heard anything she does not have a PICC line in place. She does have increasing pain. Her  doctor put her on tramadol yesterday. The wound is certainly is larger but still probes to bone surrounding soft tissue infection is beginning to be more obvious. The other issue is the patient is decided only to change her dressing every third day, she thought this was superfluous to do daily changes I called her primary doctor's office at [facility] Dr. Ouida Sills [phone] and spoke to her nurse. I was able to verify that she is never received Debra [name], [name] ([id number]) cephalosporins. The patient had thought that she had previously received Keflex. I relayed this information to Dr. Drucilla Schmidt via text Objective Constitutional Patient is hypertensive.. Pulse regular and within target range for patient.Marland Kitchen Respirations regular, non-labored and within target range.. Temperature is normal and within the target range for the patient.Marland Kitchen appears in no distress. Vitals Time Taken: 11:01 AM, Height: 64 in, Weight: 230 lbs, BMI: 39.5, Temperature: 97.8 F, Pulse: 92 bpm, Respiratory Rate: 16 breaths/min, Blood Pressure: 122/94 mmHg. Cardiovascular Pedal pulses are palpable. She has lymphedema of her legs.. General Notes: Wound exam; the areas of punched out area on the bottom of her heel. This is certainly not any better. Probes to bone as was true last time. She is beginning to have swelling around the wound which leads me believe there is possible development of soft tissue  infection she had mild purulent drainage Integumentary (Hair, Skin) Wound #1 status is Open. Original cause of wound was Gradually Appeared. The wound is located on the [place]. The wound measures 1cm length x 1.2cm width x 2.5cm depth; 0.942cm^2 area and 2.356cm^3 volume. There is Fat Layer (Subcutaneous Tissue) Exposed exposed. There is a large amount of serous drainage noted. The wound margin is flat and intact. There is large (67-100%) pink granulation within the wound bed. There is a small (1-33%) amount of necrotic tissue within the wound bed including Adherent Slough. Wound #2 status is Open. Original cause of wound was Gradually Appeared. The wound is located on the [employer]. The wound measures 0.1cm length x 0.1cm width x 0.1cm depth; 0.008cm^2 area and 0.001cm^3 volume. There is Fat Layer (Subcutaneous Tissue) Exposed exposed. There is a medium amount of serosanguineous drainage noted. The wound margin is flat and intact. There is large (67-100%) red granulation within the wound bed. There is no necrotic tissue within the wound bed. 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 - Do not get dressing wet. No tub bath. Wound #2 Left,Plantar Toe Great: May shower with protection. - Do not get dressing wet. No tub bath. Anesthetic (add to Medication List): Wound #1 Left,Plantar Foot: Topical Lidocaine 4% cream applied to wound bed prior to debridement (In Clinic Only). Wound #2 Left,Plantar Toe Great: Topical  Lidocaine 4% cream applied to wound bed prior to debridement (In Clinic Only). Primary Wound Dressing: Wound #1 Left,Plantar Foot: Debra [name], Debra [name] ([id number]) Silver Alginate - packed lightly into wound Wound #2 Left,Plantar Toe Great: Silver Alginate Secondary Dressing: Wound #1 Left,Plantar Foot: ABD and Kerlix/Conform Wound #2 Left,Plantar Toe Great: Other - Coverlet Dressing Change Frequency: Wound #1 Left,Plantar Foot: Change dressing every day. Wound #  2 Left,Plantar Toe Great: Change dressing every day. Follow-up Appointments: Wound #1 Left,Plantar Foot: Return Appointment in 1 week. Wound #2 Left,Plantar Toe Great: Return Appointment in 1 week. Edema Control: Wound #1 Left,Plantar Foot: Elevate legs to the level of the heart and pump ankles as often as possible Wound #2 Left,Plantar Toe Great: Elevate legs to the level of the heart and pump ankles as often as possible Off-Loading: Wound #1 Left,Plantar Foot: Heel suspension boot Wound #2 Left,Plantar Toe Great: Heel suspension boot Additional Orders / Instructions: Wound #1 Left,Plantar Foot: Increase protein intake. Wound #2 Left,Plantar Toe Great: Increase protein intake. Medications-please add to medication list.: Wound #1 Left,Plantar Foot: Other: - IV Antibiotics Wound #2 Left,Plantar Toe Great: Other: - IV Antibiotics 1. I spoke to Dr. Tonette Bihari office about possible prior cephalosporin exposure after being prompted to do so by the patient. None was obtained therefore we have no information about the patient's prior cephalosporin exposure in face of penicillin allergy 2. We have urged the patient to change the silver alginate-based dressings daily and to stay off the heel is much as possible 3. I have sent a text message to Dr. Wilmon Arms about where we are with the PICC line in the [facility]. I also cautioned the patient to both surrounding erythema streaking erythema or systemic symptoms 4.  Nevertheless if I did we do not have anything arranged by Friday morning I will probably suggest going to the ER to start antibiotics. I am not really understanding why things are taking so long, I wonder about insurance problems. For now I felt it was safe to try and wait for outpatient management to proceed however we will have to see if were able to get this arranged Electronic Signature(s) Signed: 07/19/2019 12:43:28 PM By: Linton Ham MD Entered By: Linton Ham on 07/19/2019 12:43:28 Debra [name] ([id number]) -------------------------------------------------------------------------------- SuperBill Details Patient Name: Debra Devon L. Date of Service: 07/19/2019 Medical Record Number: [id number] Patient Account Number: [id number] Date of Birth/Sex: 1945-05-23 (74 y.o. F) Treating RN: Cornell Barman Primary Care Provider: Vicenta Aly Other Clinician: Referring Provider: Vicenta Aly Treating Provider/Extender: Tito Dine in Treatment: 7 Diagnosis Coding ICD-10 Codes Code Description E11.621 Type 2 diabetes mellitus with foot ulcer L97.523 Non-pressure chronic ulcer of other part of left foot with necrosis of muscle M86.472 Chronic osteomyelitis with draining sinus, left ankle and foot L97.522 Non-pressure chronic ulcer of other part of left foot with fat layer exposed L02.612 Cutaneous abscess of left foot M14.672 Charcot's joint, left ankle and foot E11.42 Type 2 diabetes mellitus with diabetic polyneuropathy Facility Procedures CPT4 Code: [id number] Description: 99214 - WOUND CARE VISIT-LEV 4 EST PT Modifier: Quantity: 1 Physician Procedures CPT4 Code: [id number] Description: [id number] - WC PHYS LEVEL 4 - EST PT Modifier: Quantity: 1 CPT4 Code: Description: ICD-10 Diagnosis Description E11.621 Type 2 diabetes mellitus with foot ulcer L02.612 Cutaneous abscess of left foot M86.472 Chronic osteomyelitis with draining sinus, left ankle and  foot Modifier: Quantity: Electronic Signature(s) Signed: 07/19/2019 4:22:42 PM By: Linton Ham MD Entered By: Linton Ham on 07/19/2019 12:44:05

## 2019-07-20 NOTE — Telephone Encounter (Signed)
Faxed orders and referral to [facility]. Will need PICC appointment (5/6 or later per patient) and short stay appointment. Packet in Triage. Landis Gandy, RN

## 2019-07-22 ENCOUNTER — Emergency Department (HOSPITAL_COMMUNITY): Payer: Medicare Other

## 2019-07-22 ENCOUNTER — Inpatient Hospital Stay (HOSPITAL_COMMUNITY)
Admission: EM | Admit: 2019-07-22 | Discharge: 2019-07-31 | DRG: 629 | Disposition: A | Discharge status: Skilled Nursing Facility | Payer: Medicare Other | Attending: Internal Medicine | Admitting: Internal Medicine

## 2019-07-22 ENCOUNTER — Telehealth: Payer: Self-pay | Admitting: Infectious Disease

## 2019-07-22 ENCOUNTER — Encounter (HOSPITAL_COMMUNITY): Payer: Self-pay | Admitting: Emergency Medicine

## 2019-07-22 ENCOUNTER — Other Ambulatory Visit: Payer: Self-pay

## 2019-07-22 DIAGNOSIS — Z66 Do not resuscitate: Secondary | ICD-10-CM | POA: Diagnosis present

## 2019-07-22 DIAGNOSIS — Z880 Allergy status to penicillin: Secondary | ICD-10-CM | POA: Diagnosis not present

## 2019-07-22 DIAGNOSIS — L02612 Cutaneous abscess of left foot: Secondary | ICD-10-CM

## 2019-07-22 DIAGNOSIS — L97429 Non-pressure chronic ulcer of left heel and midfoot with unspecified severity: Secondary | ICD-10-CM | POA: Diagnosis present

## 2019-07-22 DIAGNOSIS — L97401 Non-pressure chronic ulcer of unspecified heel and midfoot limited to breakdown of skin: Secondary | ICD-10-CM | POA: Diagnosis not present

## 2019-07-22 DIAGNOSIS — E1165 Type 2 diabetes mellitus with hyperglycemia: Secondary | ICD-10-CM | POA: Diagnosis present

## 2019-07-22 DIAGNOSIS — I10 Essential (primary) hypertension: Secondary | ICD-10-CM | POA: Diagnosis present

## 2019-07-22 DIAGNOSIS — Z90710 Acquired absence of both cervix and uterus: Secondary | ICD-10-CM | POA: Diagnosis not present

## 2019-07-22 DIAGNOSIS — E118 Type 2 diabetes mellitus with unspecified complications: Secondary | ICD-10-CM

## 2019-07-22 DIAGNOSIS — M869 Osteomyelitis, unspecified: Secondary | ICD-10-CM | POA: Diagnosis present

## 2019-07-22 DIAGNOSIS — M86172 Other acute osteomyelitis, left ankle and foot: Secondary | ICD-10-CM | POA: Diagnosis present

## 2019-07-22 DIAGNOSIS — E1151 Type 2 diabetes mellitus with diabetic peripheral angiopathy without gangrene: Secondary | ICD-10-CM | POA: Diagnosis present

## 2019-07-22 DIAGNOSIS — E11621 Type 2 diabetes mellitus with foot ulcer: Secondary | ICD-10-CM | POA: Diagnosis present

## 2019-07-22 DIAGNOSIS — E1169 Type 2 diabetes mellitus with other specified complication: Secondary | ICD-10-CM | POA: Diagnosis not present

## 2019-07-22 DIAGNOSIS — Z6839 Body mass index (BMI) 39.0-39.9, adult: Secondary | ICD-10-CM

## 2019-07-22 DIAGNOSIS — Z794 Long term (current) use of insulin: Secondary | ICD-10-CM | POA: Diagnosis not present

## 2019-07-22 DIAGNOSIS — Z1624 Resistance to multiple antibiotics: Secondary | ICD-10-CM | POA: Diagnosis present

## 2019-07-22 DIAGNOSIS — B951 Streptococcus, group B, as the cause of diseases classified elsewhere: Secondary | ICD-10-CM | POA: Diagnosis present

## 2019-07-22 DIAGNOSIS — Z79899 Other long term (current) drug therapy: Secondary | ICD-10-CM | POA: Diagnosis not present

## 2019-07-22 DIAGNOSIS — E1149 Type 2 diabetes mellitus with other diabetic neurological complication: Secondary | ICD-10-CM | POA: Diagnosis present

## 2019-07-22 DIAGNOSIS — E11649 Type 2 diabetes mellitus with hypoglycemia without coma: Secondary | ICD-10-CM | POA: Diagnosis not present

## 2019-07-22 DIAGNOSIS — Z20822 Contact with and (suspected) exposure to covid-19: Secondary | ICD-10-CM | POA: Diagnosis present

## 2019-07-22 DIAGNOSIS — E08621 Diabetes mellitus due to underlying condition with foot ulcer: Secondary | ICD-10-CM | POA: Diagnosis not present

## 2019-07-22 DIAGNOSIS — L03116 Cellulitis of left lower limb: Secondary | ICD-10-CM | POA: Diagnosis present

## 2019-07-22 DIAGNOSIS — E785 Hyperlipidemia, unspecified: Secondary | ICD-10-CM | POA: Diagnosis present

## 2019-07-22 LAB — CBC WITH DIFFERENTIAL/PLATELET
Abs Immature Granulocytes: 0.02 10*3/uL (ref 0.00–0.07)
Basophils Absolute: 0 10*3/uL (ref 0.0–0.1)
Basophils Relative: 1 %
Eosinophils Absolute: 0.2 10*3/uL (ref 0.0–0.5)
Eosinophils Relative: 3 %
HCT: 36.3 % (ref 36.0–46.0)
Hemoglobin: 11.6 g/dL — ABNORMAL LOW (ref 12.0–15.0)
Immature Granulocytes: 0 %
Lymphocytes Relative: 24 %
Lymphs Abs: 1.8 10*3/uL (ref 0.7–4.0)
MCH: 29.8 pg (ref 26.0–34.0)
MCHC: 32 g/dL (ref 30.0–36.0)
MCV: 93.3 fL (ref 80.0–100.0)
Monocytes Absolute: 0.7 10*3/uL (ref 0.1–1.0)
Monocytes Relative: 9 %
Neutro Abs: 4.7 10*3/uL (ref 1.7–7.7)
Neutrophils Relative %: 63 %
Platelets: 337 10*3/uL (ref 150–400)
RBC: 3.89 MIL/uL (ref 3.87–5.11)
RDW: 13.3 % (ref 11.5–15.5)
WBC: 7.4 10*3/uL (ref 4.0–10.5)
nRBC: 0 % (ref 0.0–0.2)

## 2019-07-22 LAB — GLUCOSE, CAPILLARY
Glucose-Capillary: 129 mg/dL — ABNORMAL HIGH (ref 70–99)
Glucose-Capillary: 114 mg/dL — ABNORMAL HIGH (ref 70–99)

## 2019-07-22 LAB — HEMOGLOBIN A1C
Hgb A1c MFr Bld: 8.6 % — ABNORMAL HIGH (ref 4.8–5.6)
Mean Plasma Glucose: 200.12 mg/dL

## 2019-07-22 LAB — COMPREHENSIVE METABOLIC PANEL
ALT: 13 U/L (ref 0–44)
AST: 18 U/L (ref 15–41)
Albumin: 3.2 g/dL — ABNORMAL LOW (ref 3.5–5.0)
Alkaline Phosphatase: 87 U/L (ref 38–126)
Anion gap: 11 (ref 5–15)
BUN: 19 mg/dL (ref 8–23)
CO2: 24 mmol/L (ref 22–32)
Calcium: 9.4 mg/dL (ref 8.9–10.3)
Chloride: 104 mmol/L (ref 98–111)
Creatinine, Ser: 1.08 mg/dL — ABNORMAL HIGH (ref 0.44–1.00)
GFR calc Af Amer: 59 mL/min — ABNORMAL LOW (ref >60)
GFR calc non Af Amer: 51 mL/min — ABNORMAL LOW (ref >60)
Glucose, Bld: 180 mg/dL — ABNORMAL HIGH (ref 70–99)
Potassium: 4.2 mmol/L (ref 3.5–5.1)
Sodium: 139 mmol/L (ref 135–145)
Total Bilirubin: 0.7 mg/dL (ref 0.3–1.2)
Total Protein: 7.1 g/dL (ref 6.5–8.1)

## 2019-07-22 LAB — LACTIC ACID, PLASMA
Lactic Acid, Venous: 2 mmol/L (ref 0.5–1.9)
Lactic Acid, Venous: 1.1 mmol/L (ref 0.5–1.9)

## 2019-07-22 LAB — SARS CORONAVIRUS 2 (TAT 6-24 HRS): SARS Coronavirus 2: NEGATIVE

## 2019-07-22 MED ORDER — GADOBUTROL 1 MMOL/ML IV SOLN
10.0000 mL | Freq: Once | INTRAVENOUS | Status: AC | PRN
  Administered 2019-07-22: 15:00:00 10 mL via INTRAVENOUS

## 2019-07-22 MED ORDER — ONDANSETRON HCL 4 MG/2ML IJ SOLN
4.0000 mg | Freq: Once | INTRAMUSCULAR | Status: AC
  Administered 2019-07-22: 12:00:00 4 mg via INTRAVENOUS
  Filled 2019-07-22: qty 2

## 2019-07-22 MED ORDER — INSULIN ASPART 100 UNIT/ML ~~LOC~~ SOLN
10.0000 [IU] | Freq: Three times a day (TID) | SUBCUTANEOUS | Status: DC
  Administered 2019-07-22 – 2019-07-26 (×8): 10 [IU] via SUBCUTANEOUS

## 2019-07-22 MED ORDER — MORPHINE SULFATE (PF) 4 MG/ML IV SOLN
4.0000 mg | Freq: Once | INTRAVENOUS | Status: AC
  Administered 2019-07-22: 12:00:00 4 mg via INTRAVENOUS
  Filled 2019-07-22: qty 1

## 2019-07-22 MED ORDER — METOPROLOL SUCCINATE ER 25 MG PO TB24
50.0000 mg | ORAL_TABLET | Freq: Every day | ORAL | Status: DC
  Administered 2019-07-22 – 2019-07-31 (×10): 50 mg via ORAL
  Filled 2019-07-22 (×10): qty 2

## 2019-07-22 MED ORDER — PANTOPRAZOLE SODIUM 40 MG PO TBEC
40.0000 mg | DELAYED_RELEASE_TABLET | Freq: Every day | ORAL | Status: DC
  Administered 2019-07-22 – 2019-07-31 (×10): 40 mg via ORAL
  Filled 2019-07-22 (×10): qty 1

## 2019-07-22 MED ORDER — ATORVASTATIN CALCIUM 40 MG PO TABS
40.0000 mg | ORAL_TABLET | Freq: Every day | ORAL | Status: DC
  Administered 2019-07-22 – 2019-07-30 (×9): 40 mg via ORAL
  Filled 2019-07-22 (×9): qty 1

## 2019-07-22 MED ORDER — INSULIN ASPART 100 UNIT/ML ~~LOC~~ SOLN
0.0000 [IU] | Freq: Three times a day (TID) | SUBCUTANEOUS | Status: DC
  Administered 2019-07-22: 17:00:00 3 [IU] via SUBCUTANEOUS
  Administered 2019-07-25 – 2019-07-29 (×4): 4 [IU] via SUBCUTANEOUS
  Administered 2019-07-30 – 2019-07-31 (×3): 3 [IU] via SUBCUTANEOUS

## 2019-07-22 MED ORDER — ENOXAPARIN SODIUM 40 MG/0.4ML ~~LOC~~ SOLN
40.0000 mg | SUBCUTANEOUS | Status: DC
  Administered 2019-07-22: 40 mg via SUBCUTANEOUS
  Filled 2019-07-22: qty 0.4

## 2019-07-22 MED ORDER — INSULIN GLARGINE 100 UNIT/ML ~~LOC~~ SOLN
60.0000 [IU] | Freq: Every day | SUBCUTANEOUS | Status: DC
  Administered 2019-07-22: 60 [IU] via SUBCUTANEOUS
  Filled 2019-07-22 (×2): qty 0.6

## 2019-07-22 MED ORDER — INSULIN ASPART 100 UNIT/ML ~~LOC~~ SOLN: 0.0000 [IU] | Freq: Every day | SUBCUTANEOUS | Status: DC

## 2019-07-22 MED ORDER — HYDROMORPHONE HCL 1 MG/ML IJ SOLN
0.5000 mg | INTRAMUSCULAR | Status: DC | PRN
  Administered 2019-07-22: 1 mg via INTRAVENOUS
  Filled 2019-07-22: qty 1

## 2019-07-22 MED ORDER — ONDANSETRON HCL 4 MG PO TABS
4.0000 mg | ORAL_TABLET | Freq: Four times a day (QID) | ORAL | Status: DC | PRN
  Administered 2019-07-23: 4 mg via ORAL
  Filled 2019-07-22: qty 1

## 2019-07-22 MED ORDER — ONDANSETRON HCL 4 MG/2ML IJ SOLN
4.0000 mg | Freq: Four times a day (QID) | INTRAMUSCULAR | Status: DC | PRN
  Administered 2019-07-22 – 2019-07-27 (×5): 4 mg via INTRAVENOUS
  Filled 2019-07-22 (×5): qty 2

## 2019-07-22 NOTE — Consult Note (Signed)
ORTHOPAEDIC CONSULTATION  REQUESTING PHYSICIAN: [facility]  Chief Complaint: Left heel pain  HPI: Debra [name] is a 74 y.o. female who complains of left heel pain. 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 Dr. Tommy Medal of ID treating this issue with plan for Ertapenem to start after central line placement on 4/29. However, pain has increased and she failed tramadol. She was offered ER admission for pain control, MRI, and placement of PICC to start IV antibiotics. MRI showed osteomyelitis of calcaneous and possibly of medial talar head and orthopedics was consulted.    Past Medical History:  Diagnosis Date  . Acute osteomyelitis of calcaneum, left ([place]) 07/10/2019  . Cellulitis of left leg    history of, most recent episode 01/09  . Chronic venous insufficiency   . Diabetes mellitus with neurological manifestation ([place])   . Diabetic foot infection ([place]) 07/10/2019  . Diverticulitis    h/o 2-3 episodes in past  . Group B streptococcal infection 07/10/2019  . Hyperlipidemia LDL goal < 100   . Hypertension goal BP (blood pressure) < 130/80   . Leg edema    secondary to chronic venous insufficiency  . Morbid obesity ([place])   . Osteopenia    DEXA scan 7/07  . Pseudomonas aeruginosa infection 07/10/2019   Past Surgical History:  Procedure Laterality Date  . ABDOMINAL HYSTERECTOMY    . APPENDECTOMY    . CHOLECYSTECTOMY    . CHOLECYSTECTOMY N/A 04/14/2013   Procedure: LAPAROSCOPIC CHOLECYSTECTOMY WITH INTRAOPERATIVE CHOLANGIOGRAM;  Surgeon: Adin Hector, MD;  Location: [facility];  Service: General;  Laterality: N/A;  . COLECTOMY     with diverting colsotmy and revision in the 1990's for diverticulitis  . ERCP N/A 04/17/2013   Procedure: ENDOSCOPIC RETROGRADE CHOLANGIOPANCREATOGRAPHY (ERCP);  Surgeon: Irene Shipper, MD;  Location: [facility];  Service: Endoscopy;  Laterality: N/A;  note pt want general anesthesia for this case.  preferto perform in endo unit, but if unavailable please arrange time in OR  . EYE SURGERY  8/13   left cataract removal & retinal repair  . INSERTION OF MESH N/A 07/30/2017   Procedure: INSERTION OF MESH;  Surgeon: Jovita Kussmaul, MD;  [facility]: [facility];  Service: General;  Laterality: N/A;  . LAPAROSCOPIC LYSIS OF ADHESIONS N/A 04/14/2013   Procedure: LAPAROSCOPIC LYSIS OF ADHESIONS;  Surgeon: Adin Hector, MD;  [facility];  Service: General;  Laterality: N/A;  . LAPAROTOMY N/A 07/30/2017   Procedure: EXPLORATORY LAPAROTOMY;  Surgeon: Jovita Kussmaul, MD;  [facility];  Service: General;  Laterality: N/A;  . LYSIS OF ADHESION N/A 07/30/2017   Procedure: EXTENSIVE LYSIS OF ADHESION;  Surgeon: Jovita Kussmaul, MD;  [facility];  Service: General;  Laterality: N/A;  . OMENTECTOMY N/A 07/30/2017   Procedure: PARTIAL OMENTECTOMY;  Surgeon: Jovita Kussmaul, MD;  [facility];  Service: General;  Laterality: N/A;  . VENTRAL HERNIA REPAIR N/A 07/30/2017   Procedure: HERNIA REPAIR VENTRAL ADULT;  Surgeon: Jovita Kussmaul, MD;  [facility];  Service: General;  Laterality: N/A;   Social History   Socioeconomic History  . Marital status: Married    Spouse name: Not on file  . Number of children: Not on file  . Years of education: Not on file  . Highest education level: Not on  file  Occupational History  . Not on file  Tobacco Use  . Smoking status: Never Smoker  . Smokeless tobacco: Never Used  Substance and Sexual Activity  . Alcohol use: No  . Drug use: No  . Sexual activity: Yes  Other Topics Concern  . Not on file  Social History Narrative   One sister with chronic progressive disease requiring a trach, and 24/7 care   One sister with an abrupt hospitalization for a critical condition   Social Determinants of Health   Financial Resource  Strain:   . Difficulty of Paying Living Expenses:   Food Insecurity:   . Worried About Charity fundraiser in the Last Year:   . Arboriculturist in the Last Year:   Transportation Needs:   . Film/video editor (Medical):   Marland Kitchen Lack of Transportation (Non-Medical):   Physical Activity:   . Days of Exercise per Week:   . Minutes of Exercise per Session:   Stress:   . Feeling of Stress :   Social Connections:   . Frequency of Communication with Friends and Family:   . Frequency of Social Gatherings with Friends and Family:   . Attends Religious Services:   . Active Member of Clubs or Organizations:   . Attends Archivist Meetings:   Marland Kitchen Marital Status:    Family History  Problem Relation Age of Onset  . Heart disease Mother   . Heart disease Father   . Heart disease Brother   . Cancer Brother   . Cancer Brother   . Hypertension Sister   . Heart disease Sister    Allergies  Allergen Reactions  . Banana Hives  . Hydrocodone Hives  . Neomycin-Polymyxin-Gramicidin Itching and Swelling    Eye drops caused swelling in face and rash and itching on arms and neck  . Penicillins Hives and Itching    Has patient had a PCN reaction causing immediate rash, facial/tongue/throat swelling, SOB or lightheadedness with hypotension: Yes Has patient had a PCN reaction causing severe rash involving mucus membranes or skin necrosis: No Has patient had a PCN reaction that required hospitalization: Yes Has patient had a PCN reaction occurring within the last 10 years: Yes If all of the above answers are "NO", then may proceed with Cephalosporin use.   . Aspirin Nausea Only    And causes bruises  . Latex Hives and Rash  . Metformin And Related Diarrhea  . Voltaren [Diclofenac Sodium] Nausea And Vomiting     Positive ROS: All other systems have been reviewed and were otherwise negative with the exception of those mentioned in the HPI and as above.  Physical Exam: General: Alert,  no acute distress Cardiovascular: Mild to moderate bilateral pedal edema.  She has 1+ dorsalis pedis pulse in the left foot. Respiratory: No cyanosis, no use of accessory musculature GI: No organomegaly, abdomen is soft and non-tender Skin:                Neurologic: Decreased sensation throughout the foot Psychiatric: Patient is competent for consent with normal mood and affect Lymphatic: No axillary or cervical lymphadenopathy  MUSCULOSKELETAL: She still has motor control of the toes and ankle  Assessment/Plan: Active Problems:   Osteomyelitis (HCC)  Osteomyelitis of left heel  - MRI shows osteomyelitis of calcaneous and possibly of medial talar head, large panat heal ulceration, cellulitis along medial ankle and dorsal foot - antibiotics per ID Patient is likely to need below-knee amputation  given the involvement of the calcaneus, I will plan to involve Dr. Meridee Score in her care, who will see her on Monday.  Surgical timing is pending.  It will not be on Sunday.  Ventura Bruns, PA-C Cell [phone]   07/22/2019 5:45 PM   Reviewed, discussed, and agree with above.  Johnny Bridge, MD

## 2019-07-22 NOTE — Telephone Encounter (Signed)
Patient having excruciating pain in heel from osteomyelitis  IV invanz not going to start until Thursday when they put in central line  She is failing tramadol  I offered for her to come to ER for admission for:  #1 pain control  #2 consideration of imaging (MRI( to see if infection progressed and needs more surgery  #3 placement of PICC and starting iv abx

## 2019-07-22 NOTE — ED Provider Notes (Signed)
[facility] EMERGENCY DEPARTMENT Provider Note   CSN: [id number] Arrival date & time: 07/22/19  0944     History Chief Complaint  Patient presents with  . foot infection    Debra [name] is a 74 y.o. female.  74 y.o female with a PMH of Acute osteomyelitis, Diabetic foot infection presents to the ED with a chief complaint of left foot wound x several months. Patient reports she has trial multiple rounds of antibiotics, and has failed this therapy.  She was originally being managed by Dr. Dellia Nims and then was transferred care to Dr. Tommy Medal, she reports she is supposed to have IV antibiotics placed on Thursday however these need to be done via central line.  She was sent home on a trial of tramadol, reports she has been taking this "like candy "without any pain control.  She reports she is unable to walk, applying any pressure to her left heel makes this severe pain unbearable.  She has not been running fevers that she is aware of, there has been drainage from the wound, along with chills.  The history is provided by the patient and medical records.       Past Medical History:  Diagnosis Date  . Acute osteomyelitis of calcaneum, left ([place]) 07/10/2019  . Cellulitis of left leg    history of, most recent episode 01/09  . Chronic venous insufficiency   . Diabetes mellitus with neurological manifestation ([place])   . Diabetic foot infection ([place]) 07/10/2019  . Diverticulitis    h/o 2-3 episodes in past  . Group B streptococcal infection 07/10/2019  . Hyperlipidemia LDL goal < 100   . Hypertension goal BP (blood pressure) < 130/80   . Leg edema    secondary to chronic venous insufficiency  . Morbid obesity ([place])   . Osteopenia    DEXA scan 7/07  . Pseudomonas aeruginosa infection 07/10/2019    Patient Active Problem List   Diagnosis Date Noted  . Diabetic foot infection ([place]) 07/10/2019  . Acute osteomyelitis of calcaneum, left ([place]) 07/10/2019  . Group B  streptococcal infection 07/10/2019  . 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 Pseudogout of right knee 08/02/2017  . SBO (small bowel obstruction) ([place]) 07/22/2017  . Abnormal LFTs 03/20/2016  . Abdominal pain, left lower quadrant 07/04/2015  . Type 2 diabetes mellitus with diabetic polyneuropathy ([place]) 10/06/2013  . Hyperlipidemia associated with type 2 diabetes mellitus ([place]) 10/06/2013  . Fungal dermatitis 08/11/2013  . Hyperlipidemia 06/09/2013  . Choledocholithiasis with acute cholecystitis 04/14/2013  . Symptomatic cholelithiasis 04/13/2013  . Neuropathic pain of both legs 04/04/2013  . Severe obesity (BMI >= 40) ([place]) 11/28/2012  . Obesity (BMI 30-39.9) 11/28/2012  . Diabetes mellitus with neurological manifestation ([place])   . Hypertension goal BP (blood pressure) < 130/80   . Osteopenia   . Chronic venous insufficiency   . Leg edema   . Hypertensive retinopathy, grade 2 03/30/2012  . Medial epicondylitis 02/11/2012  . Superficial thrombophlebitis of left leg 07/15/2011  . GERD (gastroesophageal reflux disease) 02/25/2011  . Right knee pain 02/16/2011  . Preventive measure 09/11/2010  . Neck pain 07/03/2010  . Grieving 04/17/2010  . OSTEOPENIA 04/12/2006  . Type 2 diabetes, uncontrolled, with neuropathy ([place]) 03/08/2006  . MORBID OBESITY 03/08/2006  . Essential hypertension 03/08/2006    Past Surgical History:  Procedure Laterality Date  . ABDOMINAL HYSTERECTOMY    . APPENDECTOMY    . CHOLECYSTECTOMY    .  CHOLECYSTECTOMY N/A 04/14/2013   Procedure: LAPAROSCOPIC CHOLECYSTECTOMY WITH INTRAOPERATIVE CHOLANGIOGRAM;  Surgeon: Adin Hector, MD;  [facility];  Service: General;  Laterality: N/A;  . COLECTOMY     with diverting colsotmy and revision in the 1990's for diverticulitis  . ERCP N/A 04/17/2013   Procedure: ENDOSCOPIC RETROGRADE CHOLANGIOPANCREATOGRAPHY (ERCP);  Surgeon: Irene Shipper, MD;  Location: [facility];  Service: Endoscopy;   Laterality: N/A;  note pt want general anesthesia for this case.  preferto perform in endo unit, but if unavailable please arrange time in OR  . EYE SURGERY  8/13   left cataract removal & retinal repair  . INSERTION OF MESH N/A 07/30/2017   Procedure: INSERTION OF MESH;  Surgeon: Jovita Kussmaul, MD;  [facility];  Service: General;  Laterality: N/A;  . LAPAROSCOPIC LYSIS OF ADHESIONS N/A 04/14/2013   Procedure: LAPAROSCOPIC LYSIS OF ADHESIONS;  Surgeon: Adin Hector, MD;  [facility];  Service: General;  Laterality: N/A;  . LAPAROTOMY N/A 07/30/2017   Procedure: EXPLORATORY LAPAROTOMY;  Surgeon: Jovita Kussmaul, MD;  [facility];  Service: General;  Laterality: N/A;  . LYSIS OF ADHESION N/A 07/30/2017   Procedure: EXTENSIVE LYSIS OF ADHESION;  Surgeon: Jovita Kussmaul, MD;  [facility];  Service: General;  Laterality: N/A;  . OMENTECTOMY N/A 07/30/2017   Procedure: PARTIAL OMENTECTOMY;  Surgeon: Jovita Kussmaul, MD;  [facility];  Service: General;  Laterality: N/A;  . VENTRAL HERNIA REPAIR N/A 07/30/2017   Procedure: HERNIA REPAIR VENTRAL ADULT;  Surgeon: Jovita Kussmaul, MD;  [facility];  Service: General;  Laterality: N/A;     OB History   No obstetric history on file.     Family History  Problem Relation Age of Onset  . Heart disease Mother   . Heart disease Father   . Heart disease Brother   . Cancer Brother   . Cancer Brother   . Hypertension Sister   . Heart disease Sister     Social History   Tobacco Use  . Smoking status: Never Smoker  . Smokeless tobacco: Never Used  Substance Use Topics  . Alcohol use: No  . Drug use: No    Home Medications Prior to Admission medications   Medication Sig Start Date End Date Taking? Authorizing Provider  atorvastatin (LIPITOR) 40 MG tablet TAKE 1 TABLET BY MOUTH EVERY DAY Patient taking differently: Take 40 mg by mouth at bedtime.  01/05/17   Lauree Chandler, NP  celecoxib (CELEBREX) 200 MG  capsule Take 200 mg by mouth 2 (two) times daily. 04/20/17   [provider]  diphenhydrAMINE (BENADRYL) 25 MG tablet Take 25 mg by mouth at bedtime as needed for itching.    [provider]  doxycycline (VIBRAMYCIN) 100 MG capsule Take 1 capsule (100 mg total) by mouth 2 (two) times daily. 04/14/19   Zigmund Gottron, NP  furosemide (LASIX) 20 MG tablet Take 20 mg by mouth every morning.    [provider]  glucose blood (ACCU-CHEK AVIVA PLUS) test strip Ell.65 check blood sugar twice daily as directed 01/24/16   Reed, Tiffany L, DO  insulin NPH Human (HUMULIN N,NOVOLIN N) 100 UNIT/ML injection Inject 76-98 Units into the skin See admin instructions. Injects 100 units before breakfast and 82 units before supper.    [provider]  Insulin Pen Needle (BD PEN NEEDLE NANO U/F) 32G X 4 MM MISC Use once daily with the administration of  [place] DX E11.65 02/20/16   Reed, Tiffany L, DO  metFORMIN (GLUCOPHAGE) 500 MG tablet Take 1 tablet (500 mg total) by mouth daily with breakfast. Patient taking differently: Take 500 mg by mouth 2 (two) times daily with a meal.  07/26/17   Mikhail, Velta Addison, DO  metoprolol succinate (TOPROL-XL) 50 MG 24 hr tablet TAKE 1 TABLET BY MOUTH ONCE DAILY Patient taking differently: Take 50 mg by mouth daily.  06/08/16   Reed, Tiffany L, DO  omeprazole (PRILOSEC) 20 MG capsule TAKE 1 CAPSULE BY MOUTH DAILY Patient taking differently: Take 20 mg by mouth daily.  03/08/17   Reed, Tiffany L, DO  pioglitazone (ACTOS) 15 MG tablet Take 15 mg by mouth daily. 03/21/19   [provider]  potassium chloride SA (K-DUR,KLOR-CON) 20 MEQ tablet TAKE 1 TABLET(20 MEQ) BY MOUTH DAILY Patient taking differently: Take 20 mEq by mouth daily.  06/03/16   Reed, Tiffany L, DO  valsartan (DIOVAN) 320 MG tablet Take 320 mg by mouth daily. 03/21/19   [provider]    Allergies    Aspirin, Banana, Hydrocodone, Neomycin-polymyxin-gramicidin, Penicillins,  Tramadol, Voltaren [diclofenac sodium], and Latex  Review of Systems   Review of Systems  Constitutional: Positive for chills. Negative for fever.  HENT: Negative for sore throat.   Respiratory: Negative for shortness of breath.   Cardiovascular: Negative for chest pain.  Gastrointestinal: Negative for abdominal pain and vomiting.  Genitourinary: Negative for flank pain.  Musculoskeletal: Negative for back pain.  Skin: Positive for wound.  Neurological: Negative for light-headedness and headaches.  All other systems reviewed and are negative.   Physical Exam Updated Vital Signs BP 135/83 (BP Location: Left Arm)   Pulse 70   Temp 98.5 F (36.9 C) (Oral)   Resp 18   Ht 5\' 4"  (1.626 m)   Wt 104.3 kg   LMP 07/02/1973   SpO2 98%   BMI 39.48 kg/m   Physical Exam Vitals and nursing note reviewed.  Constitutional:      Appearance: Normal appearance.  HENT:     Head: Normocephalic and atraumatic.     Nose: Nose normal.     Mouth/Throat:     Mouth: Mucous membranes are moist.  Eyes:     Pupils: Pupils are equal, round, and reactive to light.  Cardiovascular:     Rate and Rhythm: Normal rate.     Pulses:          Dorsalis pedis pulses are 2+ on the left side.       Posterior tibial pulses are 2+ on the left side.  Pulmonary:     Effort: Pulmonary effort is normal.  Abdominal:     General: Abdomen is flat.  Musculoskeletal:     Cervical back: Normal range of motion and neck supple.  Feet:     Left foot:     Skin integrity: Ulcer and skin breakdown present. No warmth or callus.     Comments: Pulses present, swelling to left foot, wound noted to heel aspect with surrounding erythema and actively draining. Please see photos attached.  Skin:    General: Skin is warm and dry.  Neurological:     Mental Status: She is alert and oriented to person, place, and time.         ED Results / Procedures / Treatments   Labs (all labs ordered are listed, but only abnormal  results are displayed) Labs Reviewed  LACTIC ACID, PLASMA - Abnormal; Notable for the following components:  Result Value   Lactic Acid, Venous 2.0 (*)    All other components within normal limits  COMPREHENSIVE METABOLIC PANEL - Abnormal; Notable for the following components:   Glucose, Bld 180 (*)    Creatinine, Ser 1.08 (*)    Albumin 3.2 (*)    GFR calc non Af Amer 51 (*)    GFR calc Af Amer 59 (*)    All other components within normal limits  CBC WITH DIFFERENTIAL/PLATELET - Abnormal; Notable for the following components:   Hemoglobin 11.6 (*)    All other components within normal limits  CULTURE, BLOOD (ROUTINE X 2)  CULTURE, BLOOD (ROUTINE X 2)  LACTIC ACID, PLASMA    EKG None  Radiology No results found.  Procedures Procedures (including critical care time)  Medications Ordered in ED Medications  gadobutrol (GADAVIST) 1 MMOL/ML injection 10 mL (has no administration in time range)  morphine 4 MG/ML injection 4 mg (4 mg Intravenous Given 07/22/19 1142)  ondansetron (ZOFRAN) injection 4 mg (4 mg Intravenous Given 07/22/19 1142)    ED Course  I have reviewed the triage vital signs and the nursing notes.  Pertinent labs & imaging results that were available during my care of the patient were reviewed by me and considered in my medical decision making (see chart for details).    MDM Rules/Calculators/A&P    Patient with a previous history of diabetes presents to the ED with complaints of left foot pain which has been ongoing for several months.  According to patient she has been fighting a left foot infection managed by Dr. Dellia Nims at the beginning, has not had her care transferred to Dr. Tommy Medal with infectious disease.  Patient is scheduled to have a PICC line placed on Thursday, however has been taking tramadol at home "like candy ", without control in her symptoms.  Please see photos attached of this wound, no increase in erythema, reports no increase in  drainage.  Has had no fevers, does endorse some chills.  According to Dr. Arlyss Queen note which I have read, patient was offered to come to the ED for admission for pain control along with consideration for MRI.  She will also need placement of her PICC line and started on IV antibiotics.  Interpretation of her labs show a CMP without any electrolyte derangement, creatinine level slightly elevated.  LFTs are within normal limits.  CBC without any leukocytosis, hemoglobin slightly decreased.  Lactic acid is up at 2.  Blood cultures have been obtained.  He was started and patient was given morphine to help with pain relief.  Will place call for infectious disease for recommendations on any antibiotic therapy prior to starting him baths.  12:26 PM spoke to Dr. Tommy Medal who recommended reimaging of her left heel, hold off on antibiotics until we figure out if wound has likely increased or worsened.  MRI heel with contrast has been ordered.  2:11 PM vitals have remained within normal limits, she is hemodynamically stable.  Spoke to internal medicine service who will admit patient for further management of her osteomyelitis.  Patient was informed on plan and management and is agreeable of stay.   Portions of this note were generated with Lobbyist. Dictation errors may occur despite best attempts at proofreading.  Final Clinical Impression(s) / ED Diagnoses Final diagnoses:  Other acute osteomyelitis of left foot ([place])    Rx / DC Orders ED Discharge Orders    None  Janeece Fitting, PA-C 07/22/19 1411    Lajean Saver, MD 07/22/19 1622

## 2019-07-22 NOTE — ED Triage Notes (Signed)
Pt sent to ED per Dr. Tommy Medal for severe pain/infection in heel from osteomyelitis.  She is suppose to have central line placement for IV Invanz on Thursday.  Taking Tramadol without relief.

## 2019-07-22 NOTE — ED Notes (Signed)
Call brother Eddie Dibbles @ [phone]

## 2019-07-22 NOTE — H&P (Addendum)
Date: 07/22/2019               Patient Name:  ARIANNIE PATON MRN: [id number]  DOB: 10/23/1945 Age / Sex: 74 y.o., female   PCP: Vicenta Aly, FNP         Medical Service: Internal Medicine Teaching Service         Attending Physician: Dr. Lajean Saver, MD    First Contact: Sheppard Coil, MD, Mitzi Hansen Pager: [place] [phone])  Second Contact: Koleen Distance, DO, [place] Pager: Meriel Flavors [phone])       After Hours (After 5p/  First Contact Pager: [phone]  weekends / holidays): Second Contact Pager: [phone]   Chief Complaint: L foot pain  History of Present Illness: Ms. Wakeham is a 74 year old female with a history of osteomyelitis of the left calcaneus, [id number] complicated by diabetic foot ulcers, PVD, HTN, HLD, and morbid obesity who presented with increased pain in her left foot. Pt states it is 10/10 in severity this AM.  The patient has been dealing with her left foot osteomyelitis for several months, and has tried multiple trials of antibiotics and this failed this therapy.  She is currently being treated by Dr. Tommy Medal with infectious disease. There was a plan to have Ertapenem started on Thursday in the outpatient setting after a central line was placed, however her pain has increased and is failing tramadol. She was advised to come to the ED for further evaluation.   The patient states that she has had some brownish drainage from her foot. She denies having any fevers, headaches, chest pain, SOB, cough, abdominal pain, or changes in her bowel or bladder habits.  In the ED, a CBC, CMP, lactate, and blood cultures were drawn.  CBC and CMP were unremarkable.  Lactate was mildly elevated to 2.0.  Blood cultures are currently pending.  MRI of the left foot was obtained and the results are osteomyelitis of the calcaneus and possibly of the medial talar head.  Also cellulitis along the medial ankle and dorsal foot.  It was decided to hold off on starting IV antibiotics until we are able to further  characterize the severity of her osteomyelitis, Dr. Tommy Medal.  Culture sensitivities from 4/22 demonstrate multidrug resistant E. Coli.  Meds:  Current Meds  Medication Sig  . atorvastatin (LIPITOR) 40 MG tablet TAKE 1 TABLET BY MOUTH EVERY DAY (Patient taking differently: Take 40 mg by mouth at bedtime. )  . diphenhydrAMINE (BENADRYL) 25 MG tablet Take 25 mg by mouth at bedtime as needed for itching.  Marland Kitchen ibuprofen (ADVIL) 200 MG tablet Take 400 mg by mouth every 6 (six) hours as needed for headache (pain).  . insulin NPH-regular Human (70-30) 100 UNIT/ML injection Inject 82-100 Units into the skin See admin instructions. Inject 100 units subcutaneously before breakfast and 82 units before supper  . naproxen sodium (ALEVE) 220 MG tablet Take 220 mg by mouth daily as needed (pain).  Marland Kitchen omeprazole (PRILOSEC) 40 MG capsule Take 40 mg by mouth daily.  . pioglitazone (ACTOS) 30 MG tablet Take 30 mg by mouth daily.  . potassium chloride SA (K-DUR,KLOR-CON) 20 MEQ tablet TAKE 1 TABLET(20 MEQ) BY MOUTH DAILY (Patient taking differently: Take 20 mEq by mouth daily. )  . traMADol (ULTRAM) 50 MG tablet Take 50 mg by mouth every 8 (eight) hours as needed (pain).   . valsartan (DIOVAN) 320 MG tablet Take 320 mg by mouth daily.   Allergies: Allergies as of 07/22/2019 - Review Complete 04/14/2019  Allergen Reaction Noted  . Aspirin Nausea Only   . Banana Hives 12/30/2011  . Hydrocodone Hives   . Neomycin-polymyxin-gramicidin Itching and Swelling 03/18/2012  . Penicillins Hives and Itching   . Tramadol Nausea And Vomiting 05/03/2013  . Voltaren [diclofenac sodium] Nausea And Vomiting 02/27/2016  . Latex Hives and Rash    Past Medical History:  Diagnosis Date  . Acute osteomyelitis of calcaneum, left ([place]) 07/10/2019  . Cellulitis of left leg    history of, most recent episode 01/09  . Chronic venous insufficiency   . Diabetes mellitus with neurological manifestation ([place])   . Diabetic foot infection  ([place]) 07/10/2019  . Diverticulitis    h/o 2-3 episodes in past  . Group B streptococcal infection 07/10/2019  . Hyperlipidemia LDL goal < 100   . Hypertension goal BP (blood pressure) < 130/80   . Leg edema    secondary to chronic venous insufficiency  . Morbid obesity ([place])   . Osteopenia    DEXA scan 7/07  . Pseudomonas aeruginosa infection 07/10/2019   Past Surgical History:  Procedure Laterality Date  . ABDOMINAL HYSTERECTOMY    . APPENDECTOMY    . CHOLECYSTECTOMY    . CHOLECYSTECTOMY N/A 04/14/2013   Procedure: LAPAROSCOPIC CHOLECYSTECTOMY WITH INTRAOPERATIVE CHOLANGIOGRAM;  Surgeon: Adin Hector, MD;  Location: [facility];  Service: General;  Laterality: N/A;  . COLECTOMY     with diverting colsotmy and revision in the 1990's for diverticulitis  . ERCP N/A 04/17/2013   Procedure: ENDOSCOPIC RETROGRADE CHOLANGIOPANCREATOGRAPHY (ERCP);  Surgeon: Irene Shipper, MD;  Location: [facility];  Service: Endoscopy;  Laterality: N/A;  note pt want general anesthesia for this case.  preferto perform in endo unit, but if unavailable please arrange time in OR  . EYE SURGERY  8/13   left cataract removal & retinal repair  . INSERTION OF MESH N/A 07/30/2017   Procedure: INSERTION OF MESH;  Surgeon: Jovita Kussmaul, MD;  Location: [facility];  Service: General;  Laterality: N/A;  . LAPAROSCOPIC LYSIS OF ADHESIONS N/A 04/14/2013   Procedure: LAPAROSCOPIC LYSIS OF ADHESIONS;  Surgeon: Adin Hector, MD;  Location: [facility];  Service: General;  Laterality: N/A;  . LAPAROTOMY N/A 07/30/2017   Procedure: EXPLORATORY LAPAROTOMY;  Surgeon: Jovita Kussmaul, MD;  Location: [facility];  Service: General;  Laterality: N/A;  . LYSIS OF ADHESION N/A 07/30/2017   Procedure: EXTENSIVE LYSIS OF ADHESION;  Surgeon: Jovita Kussmaul, MD;  Location: [facility];  Service: General;  Laterality: N/A;  . OMENTECTOMY N/A 07/30/2017   Procedure: PARTIAL OMENTECTOMY;  Surgeon: Jovita Kussmaul, MD;  Location: [facility];  Service: General;   Laterality: N/A;  . VENTRAL HERNIA REPAIR N/A 07/30/2017   Procedure: HERNIA REPAIR VENTRAL ADULT;  Surgeon: Jovita Kussmaul, MD;  Location: [facility];  Service: General;  Laterality: N/A;   Family History:  Family History  Problem Relation Age of Onset  . Heart disease Mother   . Heart disease Father   . Heart disease Brother   . Cancer Brother   . Cancer Brother   . Hypertension Sister   . Heart disease Sister     Social History:  Social History   Tobacco Use  . Smoking status: Never Smoker  . Smokeless tobacco: Never Used  Substance Use Topics  . Alcohol use: No  . Drug use: No    Review of Systems: A complete ROS was negative except as per HPI.   Imaging: MRI L foot:  IMPRESSION 1. Osteomyelitis of the calcaneus and possibly of the medial talar head. 2. Large plantar heel ulceration, extending up through the medial band of the plantar fascia to the calcaneal spur. Small fluid collection just below the calcaneal spur draining to the cutaneous surface. 3. Distal tibialis posterior tendinopathy. 4. Cellulitis along the medial ankle and dorsal foot.  Foot Wound Aerobic Culture 07/13/19   Escherichia coli    MIC    AMPICILLIN >=32 RESIST... Resistant    AMPICILLIN/SULBACTAM 16 INTERMED... Intermediate    CEFAZOLIN <=4 SENSITIVE  Sensitive    CEFEPIME <=1 SENSITIVE  Sensitive    CEFTAZIDIME <=1 SENSITIVE  Sensitive    CEFTRIAXONE <=1 SENSITIVE  Sensitive    CIPROFLOXACIN >=4 RESISTANT  Resistant    GENTAMICIN <=1 SENSITIVE  Sensitive    IMIPENEM <=0.25 SENS... Sensitive    PIP/TAZO <=4 SENSITIVE  Sensitive    TRIMETH/SULFA >=320 RESIS... Resistant    Physical Exam: Blood pressure 135/83, pulse 70, temperature 98.5 F (36.9 C), temperature source Oral, resp. rate 18, height 5\' 4"  (1.626 m), weight 104.3 kg, last menstrual period 07/02/1973, SpO2 98 %.  Physical Exam Vitals reviewed.  Constitutional:      General: She is not in acute distress.    Appearance: She is  obese. She is not ill-appearing, toxic-appearing or diaphoretic.  HENT:     Head: Normocephalic and atraumatic.  Eyes:     General: No scleral icterus.    Extraocular Movements: Extraocular movements intact.     Conjunctiva/sclera: Conjunctivae normal.  Cardiovascular:     Rate and Rhythm: Normal rate and regular rhythm.     Pulses: Normal pulses.     Heart sounds: Normal heart sounds. No murmur. No friction rub. No gallop.   Pulmonary:     Effort: Pulmonary effort is normal. No respiratory distress.     Breath sounds: Normal breath sounds. No wheezing or rales.  Abdominal:     General: Bowel sounds are normal. There is no distension.     Palpations: Abdomen is soft.     Tenderness: There is no abdominal tenderness. There is no guarding.     Comments: Well healed surgical scar from prior hernia repair  Musculoskeletal:     Right lower leg: No edema.     Left lower leg: No edema.  Neurological:     General: No focal deficit present.     Mental Status: She is alert and oriented to person, place, and time.     Sensory: Sensory deficit (decreased sensation in bilateral lower extremiities) present.  Psychiatric:        Mood and Affect: Mood normal.   Media Information    Assessment & Plan by Problem: Active Problems:   Osteomyelitis ([place])  In summary, Ms. Serrao is a 74 year old female with a past medical history significant for left calcaneus osteomyelitis, [id number] complicated by diabetic foot ulcers, PVD, HTN, HLD, and morbid obesity who presented with worsening left foot pain in the setting of known osteomyelitis that has been unresponsive to p.o. antibiotics in the outpatient setting.  #Osteomyelitis L foot: Multidrug-resistant E. coli growing out of the patient's foot wound on 4/22.  There was a plan to start ertapenem IV in the outpatient setting with ID this Thursday, however the patient had not been started on it yet because she needed central line placement.  Her pain became  unbearable despite use of tramadol, so the patient was advised to come to the ED. patient's vital signs are stable, and  has no signs of sepsis at this time. MRI shows worsening osteomyelitis in the calcaneus and possibly medial talus. -Consult ID, appreciate recommendations -Consult Ortho, appreciate recommendations -Hold off on abx for now  -Dilaudid 0.5-1 mg every 2 hours as needed  #T2DM: Patient takes 70/30 insulin 100 units in the morning and 82 units at night in addition pioglitazone 40 mg daily at home. -Hold home pioglitazone  -Lantus 60 units BID -SSI  #HTN #HLD -Metoprolol 50 mg daily -Atorvastatin 40 mg daily  #FEN/GI -Diet: Carb modifed -Fluids: none -Protonix 40 mg daily  #DVT prophylaxis -Lovenox 40 mg subq injections daily  #CODE STATUS: DNR  #Dispo: Admit patient to Inpatient with expected length of stay greater than 2 midnights. Prior to Admission Living Arrangement: Home Anticipated Discharge Location: Home Barriers to Discharge:   Signed: Earlene Plater, MD Internal Medicine, PGY1 Pager: [phone]  07/22/2019,2:09 PM

## 2019-07-23 DIAGNOSIS — L97401 Non-pressure chronic ulcer of unspecified heel and midfoot limited to breakdown of skin: Secondary | ICD-10-CM

## 2019-07-23 DIAGNOSIS — E08621 Diabetes mellitus due to underlying condition with foot ulcer: Secondary | ICD-10-CM

## 2019-07-23 LAB — CBC
HCT: 35.2 % — ABNORMAL LOW (ref 36.0–46.0)
Hemoglobin: 11.3 g/dL — ABNORMAL LOW (ref 12.0–15.0)
MCH: 30.1 pg (ref 26.0–34.0)
MCHC: 32.1 g/dL (ref 30.0–36.0)
MCV: 93.9 fL (ref 80.0–100.0)
Platelets: 335 10*3/uL (ref 150–400)
RBC: 3.75 MIL/uL — ABNORMAL LOW (ref 3.87–5.11)
RDW: 13.4 % (ref 11.5–15.5)
WBC: 7.2 10*3/uL (ref 4.0–10.5)
nRBC: 0 % (ref 0.0–0.2)

## 2019-07-23 LAB — BASIC METABOLIC PANEL
Anion gap: 9 (ref 5–15)
BUN: 16 mg/dL (ref 8–23)
CO2: 26 mmol/L (ref 22–32)
Calcium: 9.2 mg/dL (ref 8.9–10.3)
Chloride: 104 mmol/L (ref 98–111)
Creatinine, Ser: 0.99 mg/dL (ref 0.44–1.00)
GFR calc Af Amer: >60 mL/min (ref >60)
GFR calc non Af Amer: 57 mL/min — ABNORMAL LOW (ref >60)
Glucose, Bld: 120 mg/dL — ABNORMAL HIGH (ref 70–99)
Potassium: 4.4 mmol/L (ref 3.5–5.1)
Sodium: 139 mmol/L (ref 135–145)

## 2019-07-23 LAB — GLUCOSE, CAPILLARY
Glucose-Capillary: 112 mg/dL — ABNORMAL HIGH (ref 70–99)
Glucose-Capillary: 98 mg/dL (ref 70–99)
Glucose-Capillary: 89 mg/dL (ref 70–99)
Glucose-Capillary: 75 mg/dL (ref 70–99)
Glucose-Capillary: 96 mg/dL (ref 70–99)

## 2019-07-23 MED ORDER — ENOXAPARIN SODIUM 60 MG/0.6ML ~~LOC~~ SOLN: 50.0000 mg | SUBCUTANEOUS | Status: DC

## 2019-07-23 MED ORDER — ACETAMINOPHEN 325 MG PO TABS
650.0000 mg | ORAL_TABLET | Freq: Four times a day (QID) | ORAL | Status: DC | PRN
  Administered 2019-07-23 – 2019-07-30 (×8): 650 mg via ORAL
  Filled 2019-07-23 (×8): qty 2

## 2019-07-23 MED ORDER — INSULIN GLARGINE 100 UNIT/ML ~~LOC~~ SOLN
45.0000 [IU] | Freq: Every day | SUBCUTANEOUS | Status: DC
  Administered 2019-07-23 – 2019-07-25 (×3): 45 [IU] via SUBCUTANEOUS
  Filled 2019-07-23 (×4): qty 0.45

## 2019-07-23 NOTE — Progress Notes (Signed)
Date: 07/23/2019  Patient name: Debra [name]  Medical record number: [id number]  Date of birth: [birth year]   I have seen and evaluated Debra [name] and discussed their care with the Residency Team.  Debra [name] is a 74 year old woman with poorly controlled diabetes and presumed osteomyelitis (Debra [name] indicated that the MRI findings are not conclusive but worrisome at his visit on April 19) of the left calcaneus.  His note indicated be discussed that BKA's are often required but that they would try systemic antibiotics first.  Debra [name] notes indicated that he did scrapings things during his April 21 appointment which grew E. coli and Debra [name] was planning a PICC line ertapenem.  However, Debra pain and drainage increased and she presented to the ED for further work-up and treatment.  An MRI completed on admission showed osteomyelitis of the calcaneus and possibly medial talar head along with a large plantar heel ulceration.  Debra [name] of orthopedics saw the patient in consultation and he feels a BKA is likely indicated but is involving Debra [name] in Debra care.  Today, the patient felt nauseous and had one episode of vomiting as documented by Debra [name] in his note.  She continues to note generated of the left wound.  With Debra [name] brought up the possibility of a BKA, she was adamant she would not undergo a BKA.  PMHx, Fam Hx, and/or Soc Hx : Debra [name] passed away about a year ago and Debra [name] moved in to help Debra.  Debra [name] office note in April indicated that he wants Debra to move but she is reluctant to.  Vitals:   07/23/19 0009 07/23/19 0411  BP: (!) 104/57 126/61  Pulse: 64 63  Resp: 18 18  Temp: 98.5 F (36.9 C) 97.8 F (36.6 C)  SpO2: 95% 98%  No acute distress, lying in bed H RRR holosystolic murmur throughout precordium Left foot bandaged with visible drainage  Pertinent labs  A1c on admission 8.6 CRP 3.8 April 19 WBC 7.2 Hemoglobin 11.3, MCV 94, RDW 13  Echo  from 2019 showed mild aortic stenosis, mild aortic regurgitation, mild tricuspid regurgitation  ABIs 2016 right 1.2, left 1.24  Assessment and Plan: I have seen and evaluated the patient as outlined above. I agree with the formulated Assessment and Plan as detailed in the residents' note, with the following changes:  Debra [name] is a 74 year old woman with poorly controlled diabetes and osteomyelitis of the left calcaneus which has failed outpatient oral antibiotic therapy.  Initial input from surgery indicates a BKA is necessary that the patient currently is adamantly against this.  We have not started antibiotics to allow for culture data if obtained.  The team will touch base with surgery and ID today regarding antibiotics and interventions.  Debra ABI was last assessed in 2016 so we will update those to assess for peripheral arterial disease.  It is possible she has significant microvascular damage as the reason for nonresponse to oral antibiotics.  If she remains opposed to an amputation, I would encourage palliative care consult to discuss a plan forward.  We will try to optimize Debra diabetic control, utilizing a GLP-1 if he does have vascular disease.  1.  Left calcaneal osteomyelitis - f/u ID and Ortho recommendations On antibiotics Pain control ABIs TBIs  2.  Type 2 diabetes, insulin-dependent, poorly controlled Strive for excellent diabetic control in the hospital Might benefit from GLP-1  Other problems per Dr. Luretha Rued progress note dated today.  Debra osteomyelitis represents a chronic illness that is posing a threat to bodily function.  I reviewed Debra PCPs office note, Dr. Lucianne Lei [name]'s office note, and wound care's office note.  I reviewed past ABIs.  I reviewed lab test results and imaging.  Debra Crews, MD 5/2/202111:28 AM  [phone]

## 2019-07-23 NOTE — Evaluation (Signed)
Physical Therapy Evaluation Patient Details Name: Debra [name] MRN: [id number] DOB: March 20, 1946 Today's Date: 07/23/2019   History of Present Illness  74 y.o. female who complains of left heel pain. 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    Clinical Impression  Pt admitted with above diagnosis. PTA pt lived at home with her brother, ambulatory household distances with RW. Her mobility has steadily declined due to L foot pain. She reports no recent falls. On eval, she required supervision bed mobility, min guard assist transfers and min guard assist ambulation 5' with RW. Gait distance limited by pain. MD has discussed possible need for L BKA but pt states she is adamantly opposed. She is to consult with Dr. Sharol Given tomorrow. Pt currently with functional limitations due to the deficits listed below (see PT Problem List). Pt will benefit from skilled PT to increase their independence and safety with mobility to allow discharge to the venue listed below.       Follow Up Recommendations Home health PT;Supervision/Assistance - 24 hour    Equipment Recommendations  None recommended by PT    Recommendations for Other Services       Precautions / Restrictions Precautions Precautions: Fall      Mobility  Bed Mobility Overal bed mobility: Needs Assistance Bed Mobility: Supine to Sit     Supine to sit: Supervision;HOB elevated     General bed mobility comments: +rail, increased time, supervision for safety  Transfers Overall transfer level: Needs assistance Equipment used: Rolling walker (2 wheeled) Transfers: Sit to/from [facility] Sit to Stand: Min guard Stand pivot transfers: Min guard       General transfer comment: min guard for safety, increased time to power  up  Ambulation/Gait Ambulation/Gait assistance: Min guard Gait Distance (Feet): 5 Feet Assistive device: Rolling walker (2 wheeled) Gait Pattern/deviations: Step-through pattern;Antalgic;Decreased stride length;Trunk flexed     General Gait Details: distance limited by pain, min guard for safety  Stairs            Wheelchair Mobility    Modified Rankin (Stroke Patients Only)       Balance Overall balance assessment: Needs assistance Sitting-balance support: No upper extremity supported;Feet supported Sitting balance-Leahy Scale: Fair     Standing balance support: Bilateral upper extremity supported;During functional activity Standing balance-Leahy Scale: Poor Standing balance comment: reliant on UE support                             Pertinent Vitals/Pain Pain Assessment: Faces Faces Pain Scale: Hurts even more Pain Location: L foot with weight bear Pain Descriptors / Indicators: Discomfort;Sore;Grimacing;Guarding Pain Intervention(s): Limited activity within patient's tolerance;Monitored during session;Repositioned    Home Living Family/patient expects to be discharged to:: Private residence Living Arrangements: Other relatives(brother) Available Help at Discharge: Family;Available 24 hours/day Type of Home: House Home Access: Stairs to enter Entrance Stairs-Rails: Right Entrance Stairs-Number of Steps: 3 Home Layout: One level Home Equipment: Cane - single point;Walker - 2 wheels      Prior Function Level of Independence: Needs assistance   Gait / Transfers Assistance Needed: ambulates household distances with RW, brother assists in/out house due to stairs  ADL's / Homemaking Assistance Needed: Pt has been sponge bathing. Mod I dressing. Brother assists with iADLs and transportation.  Hand Dominance        Extremity/Trunk Assessment   Upper Extremity Assessment Upper Extremity Assessment: Overall WFL for tasks assessed     Lower Extremity Assessment Lower Extremity Assessment: Generalized weakness    Cervical / Trunk Assessment Cervical / Trunk Assessment: Kyphotic  Communication   Communication: No difficulties  Cognition Arousal/Alertness: Awake/alert Behavior During Therapy: WFL for tasks assessed/performed Overall Cognitive Status: Within Functional Limits for tasks assessed                                        General Comments      Exercises     Assessment/Plan    PT Assessment Patient needs continued PT services  PT Problem List Decreased strength;Decreased mobility;Pain;Decreased balance;Decreased activity tolerance       PT Treatment Interventions Therapeutic activities;Gait training;Therapeutic exercise;Patient/family education;Stair training;Balance training;Functional mobility training    PT Goals (Current goals can be found in the Care Plan section)  Acute Rehab PT Goals Patient Stated Goal: home PT Goal Formulation: With patient Time For Goal Achievement: 08/06/19 Potential to Achieve Goals: Fair    Frequency Min 3X/week   Barriers to discharge        Co-evaluation               AM-PAC PT "6 Clicks" Mobility  Outcome Measure Help needed turning from your back to your side while in a flat bed without using bedrails?: None Help needed moving from lying on your back to sitting on the side of a flat bed without using bedrails?: A Little Help needed moving to and from a bed to a chair (including a wheelchair)?: A Little Help needed standing up from a chair using your arms (e.g., wheelchair or bedside chair)?: A Little Help needed to walk in hospital room?: A Little Help needed climbing 3-5 steps with a railing? : A Lot 6 Click Score: 18    End of Session Equipment Utilized During Treatment: Gait belt Activity Tolerance: Patient limited by pain Patient left: in chair;with call bell/phone within reach Nurse Communication: Mobility status PT  Visit Diagnosis: Pain;Difficulty in walking, not elsewhere classified (R26.2) Pain - Right/Left: Left Pain - part of body: Ankle and joints of foot    Time: [id number] PT Time Calculation (min) (ACUTE ONLY): 25 min   Charges:   PT Evaluation $PT Eval Moderate Complexity: 1 Mod PT Treatments $Gait Training: 8-22 mins        Lorrin Goodell, PT  Office # [phone] Pager [phone]   Lorriane Shire 07/23/2019, 3:43 PM

## 2019-07-23 NOTE — Progress Notes (Signed)
Subjective:   Pt was seen at the bedside today. She was nauseated all night and had one episode of emesis. She feels a little better this morning and wants to eat breakfast.  Pain in her foot has improved with medications. Notes significant drainage of her wound through the bandage. Discussed plan to evaluate blood flow in her leg today, and the patient is in agreement.  We also discussed the possibility of the orthopedic doctors suggesting that she may need amputation, and the patient was adamantly against this.  All concerns were addressed.  Objective: CBC Latest Ref Rng & Units 07/23/2019 07/22/2019 07/10/2019  WBC 4.0 - 10.5 K/uL 7.2 7.4 8.2  Hemoglobin 12.0 - 15.0 g/dL 11.3(L) 11.6(L) 12.7  Hematocrit 36.0 - 46.0 % 35.2(L) 36.3 39.7  Platelets 150 - 400 K/uL 335 337 290   BMP Latest Ref Rng & Units 07/23/2019 07/22/2019 07/10/2019  Glucose 70 - 99 mg/dL 120(H) 180(H) 258(H)  BUN 8 - 23 mg/dL 16 19 19   Creatinine 0.44 - 1.00 mg/dL 0.99 1.08(H) 0.94(H)  BUN/Creat Ratio 6 - 22 (calc) - - 20  Sodium 135 - 145 mmol/L 139 139 138  Potassium 3.5 - 5.1 mmol/L 4.4 4.2 4.3  Chloride 98 - 111 mmol/L 104 104 104  CO2 22 - 32 mmol/L 26 24 27   Calcium 8.9 - 10.3 mg/dL 9.2 9.4 9.5   Vital signs in last 24 hours: Vitals:   07/22/19 1640 07/22/19 2025 07/23/19 0009 07/23/19 0411  BP: (!) 146/55 117/61 (!) 104/57 126/61  Pulse: 76 74 64 63  Resp: 16 18 18 18   Temp: 97.8 F (36.6 C) 98.5 F (36.9 C) 98.5 F (36.9 C) 97.8 F (36.6 C)  TempSrc: Oral Oral Oral Oral  SpO2: 93% 97% 95% 98%  Weight:      Height:       Physical Exam General: Sitting up in bed, appears comfortable HEENT: NCAT CV: RRR, normal S1 and S2, blowing systolic murmur best appreciated at the left upper sternal border. PULM: Clear to auscultation bilaterally in all lung fields ABD: Soft and nontender MSK: Left foot ulcer training yellow fluid through padding NEURO: Alert and oriented, nonfocal  Assessment/Plan:  Active  Problems:   Osteomyelitis ([place])  In summary, [name] is a 74 year old female with a past medical history significant for left calcaneus osteomyelitis, [id number] complicated by diabetic foot ulcers, PVD, HTN, HLD, and morbid obesity who presented with worsening left foot pain in the setting of known osteomyelitis that has been unresponsive to p.o. antibiotics in the outpatient setting.  #Osteomyelitis L foot: Multidrug-resistant E. coli growing out of the patient's foot wound on 4/22. There was a plan to start ertapenem IV in the outpatient setting with ID this Thursday, however the patient had not been started on it yet because she needed central line placement.  Her pain became unbearable despite use of tramadol, so the patient was advised to come to the ED. Patient's vital signs continue to be stable, and has no signs of sepsis at this time. MRI shows worsening osteomyelitis in the calcaneus and possibly medial talus. -Consult ID, appreciate recommendations. Holding off on abx for now. -Consult Ortho, appreciate recommendations  -Dr. Sharol Given to see patient tomorrow, high suspicion for need for amputation -Dilaudid 0.5-1 mg every 2 hours as needed  #PVD?:  Progress note from 2016 shows that the patient told her provider that she had peripheral vascular disease, however had normal ABIs at this time. Given the extent of her  and foot infection and underlying diabetes, think it is reasonable to evaluate this again. -ABIs ordered  #T2DM: Patient takes 70/30 insulin 100 units in the morning and 82 units at night in addition pioglitazone 40 mg daily at home. Hgb A1c 8.6 on admission. -Hold home pioglitazone  -Lantus 60 units BID -SSI  #HTN #HLD -Metoprolol 50 mg daily -Atorvastatin 40 mg daily  #FEN/GI -Diet: Carb modifed -Fluids: none -Protonix 40 mg daily  #DVT prophylaxis -Lovenox per pharmacy  #CODE STATUS: DNR  #Dispo: Suspected d/c in 2-3 days Prior to Admission Living  Arrangement: Home Anticipated Discharge Location: Home Barriers to [place] medical workup  Earlene Plater, MD Internal Medicine, PGY1 Pager: [phone]  07/23/2019,6:46 AM

## 2019-07-23 NOTE — Progress Notes (Signed)
ANTICOAGULATION CONSULT NOTE - Initial Consult  Pharmacy Consult for enoxaparin Indication: VTE prophylaxis  Allergies  Allergen Reactions  . Banana Hives  . Hydrocodone Hives  . Neomycin-Polymyxin-Gramicidin Itching and Swelling    Eye drops caused swelling in face and rash and itching on arms and neck  . Penicillins Hives and Itching    Has patient had a PCN reaction causing immediate rash, facial/tongue/throat swelling, SOB or lightheadedness with hypotension: Yes Has patient had a PCN reaction causing severe rash involving mucus membranes or skin necrosis: No Has patient had a PCN reaction that required hospitalization: Yes Has patient had a PCN reaction occurring within the last 10 years: Yes If all of the above answers are "NO", then may proceed with Cephalosporin use.   . Aspirin Nausea Only    And causes bruises  . Latex Hives and Rash  . Metformin And Related Diarrhea  . Voltaren [Diclofenac Sodium] Nausea And Vomiting    Patient Measurements: Height: 5\' 4"  (162.6 cm) Weight: 104.3 kg (230 lb) IBW/kg (Calculated) : 54.7 HEPARIN DW (KG): 79.2  Vital Signs: Temp: 97.8 F (36.6 C) (05/02 0411) Temp Source: Oral (05/02 0411) BP: 126/61 (05/02 0411) Pulse Rate: 63 (05/02 0411)  Labs: Recent Labs    07/22/19 1014 07/23/19 0305  HGB 11.6* 11.3*  HCT 36.3 35.2*  PLT 337 335  CREATININE 1.08* 0.99    Estimated Creatinine Clearance: 60.4 mL/min (by C-G formula based on SCr of 0.99 mg/dL).   Medical History: Past Medical History:  Diagnosis Date  . Acute osteomyelitis of calcaneum, left ([place]) 07/10/2019  . Cellulitis of left leg    history of, most recent episode 01/09  . Chronic venous insufficiency   . 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 Diabetic foot infection ([place]) 07/10/2019  . Diverticulitis    h/o 2-3 episodes in past  . Group B streptococcal infection 07/10/2019  . Hyperlipidemia LDL goal < 100   . Hypertension goal BP (blood  pressure) < 130/80   . Leg edema    secondary to chronic venous insufficiency  . Morbid obesity ([place])   . Osteopenia    DEXA scan 7/07  . Pseudomonas aeruginosa infection 07/10/2019    Assessment: 74 y.o. female presenting with diabetic foot ulcers and osteomyelitis and admitted 5/1 for pain control, PICC placement to start IV ertapenem, and evaluation of wound. Pharmacy has been consulted for enoxaparin dosing for VTE prophylaxis.   Patient was not on anticoagulant prior to admission. Today, Hgb low but stable at 11.3 and platelets WNL. No overt bleeding noted.  Goal of Therapy:  Monitor platelets by anticoagulation protocol: Yes  Plan:  Start enoxaparin 50mg  subcutaneously q24h (0.5mg /kg q24h for CrCl>10ml/min, BMI> 30) Monitor CBC, monitor for signs/symptoms of bleeding F/u potential need to hold anticoagulation pending surgical procedures   Brendolyn Patty, PharmD PGY2 Pharmacy Resident Phone [phone]

## 2019-07-23 NOTE — Consult Note (Signed)
Date of Admission:  07/22/2019          Reason for Consult: DFU with osteomyelitis of calcaneous and talus    Referring Provider: Dr. Lynnae January   Assessment:  1. Calcaneal osteomyelitis left foot 2. E coli isolated on deep culture by Dr Dellia Nims 3. PCN allergy (hives as child) but has tolerated keflex since then  Plan:  1. I agree with Dr Mardelle Matte that she would need BKA to cure this infection but patient will not agree to this at this time 2. If she continues to refuse (she is adamant w me) would ask Dr Sharol Given to consider debridement of infected bone, tissue and send for culture 3. In interim hold antibiotics, but after cultures taken she could be placed on IV invanz. I would then treat for 6 weeks followed by lifelong antibiotics orally   Active Problems:   Osteomyelitis ([place])   Scheduled Meds: . atorvastatin  40 mg Oral QHS  . insulin aspart  0-20 Units Subcutaneous TID WC  . insulin aspart  0-5 Units Subcutaneous QHS  . insulin aspart  10 Units Subcutaneous TID WC  . insulin glargine  45 Units Subcutaneous QHS  . metoprolol succinate  50 mg Oral Daily  . pantoprazole  40 mg Oral Daily   Continuous Infusions: PRN Meds:.acetaminophen, HYDROmorphone (DILAUDID) injection, ondansetron **OR** ondansetron (ZOFRAN) IV  HPI: Debra [name] is a 74 y.o. female 74 year old Caucasian female with past medical history significant for diabetes mellitus, chronic venous insufficiency, diabetic neuropathy who is struggled with diabetic foot ulcers.  She has had problems with 1 ulcer near her toe but then going back to July 2021 she had eruption of an ulcer on her heel.  This is been managed by close follow-up with the wound care with Jeri Cos PA serial debridements antibiotics.  More recently there was concern for osteomyelitis and MRI  In late March of the heel showed some findings suggestive of osteomyelitis involving part of the calcaneus.  She was referred to me for evaluation and  treatment of calcaneal osteomyelitis and I saw her on April 19th. 2021.  In terms of culture data more recently on March 10 she had some tissue culture that grew GB streptococcus and Morganella which was fairly R organism  In the past she had cultures taken at [facility] which had grown a Pan sensitive pseudomonas from culture in 01/27/2017 pan sensitive Pseudomonas and Group B strep and isn 06/22/2017.  She was off antibiotics and Dr. Dellia Nims took deep culture from bone at [facility] that grew E coli R bactrim, cipro, augmentin.  We had planned IV placement and IV inanz but central line not to be placed until Thursday.  In the inteirm she had worsening heel pain and called me yesterday and I advised trip to ER for admission.  Unfortunately her MRI now shows unequivocally 1. Osteomyelitis of the calcaneus and possibly of the medial talar head. 2. Large plantar heel ulceration, extending up through the medial band of the plantar fascia to the calcaneal spur. Small fluid collection just below the calcaneal spur draining to the cutaneous surface.  I 100% agree with Dr. Mardelle Matte that patient would need BKA to cure this infection. She is very much against this. In that case I would recommend asking Dr. Sharol Given to get deep cultures from bone that we can assess.   Postoperatively I would use invanz (not now because of allergies but because of R of morganella species previously isolated)  Review of Systems: Review of Systems  Constitutional: Negative for chills, fever, malaise/fatigue and weight loss.  HENT: Negative for congestion and sore throat.   Eyes: Negative for blurred vision and photophobia.  Respiratory: Negative for cough, shortness of breath and wheezing.   Cardiovascular: Negative for chest pain, palpitations and leg swelling.  Gastrointestinal: Negative for abdominal pain, blood in stool, constipation, diarrhea, heartburn, melena, nausea and vomiting.    Genitourinary: Negative for dysuria, flank pain and hematuria.  Musculoskeletal: Positive for joint pain. Negative for back pain, falls and myalgias.  Skin: Negative for itching and rash.  Neurological: Negative for dizziness, focal weakness, loss of consciousness, weakness and headaches.  Endo/Heme/Allergies: Does not bruise/bleed easily.  Psychiatric/Behavioral: Negative for depression and suicidal ideas. The patient does not have insomnia.     Past Medical History:  Diagnosis Date  . Acute osteomyelitis of calcaneum, left ([place]) 07/10/2019  . Cellulitis of left leg    history of, most recent episode 01/09  . Chronic venous insufficiency   . 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 Diabetic foot infection ([place]) 07/10/2019  . Diverticulitis    h/o 2-3 episodes in past  . Group B streptococcal infection 07/10/2019  . Hyperlipidemia LDL goal < 100   . Hypertension goal BP (blood pressure) < 130/80   . Leg edema    secondary to chronic venous insufficiency  . Morbid obesity ([place])   . Osteopenia    DEXA scan 7/07  . Pseudomonas aeruginosa infection 07/10/2019    Social History   Tobacco Use  . Smoking status: Never Smoker  . Smokeless tobacco: Never Used  Substance Use Topics  . Alcohol use: No  . Drug use: No    Family History  Problem Relation Age of Onset  . Heart disease Mother   . Heart disease Father   . Heart disease Brother   . Cancer Brother   . Cancer Brother   . Hypertension Sister   . Heart disease Sister    Allergies  Allergen Reactions  . Banana Hives  . Hydrocodone Hives  . Neomycin-Polymyxin-Gramicidin Itching and Swelling    Eye drops caused swelling in face and rash and itching on arms and neck  . Penicillins Hives and Itching    Has patient had a PCN reaction causing immediate rash, facial/tongue/throat swelling, SOB or lightheadedness with hypotension: Yes Has patient had a PCN reaction causing severe rash involving mucus  membranes or skin necrosis: No Has patient had a PCN reaction that required hospitalization: Yes Has patient had a PCN reaction occurring within the last 10 years: Yes If all of the above answers are "NO", then may proceed with Cephalosporin use.   . Aspirin Nausea Only    And causes bruises  . Latex Hives and Rash  . Metformin And Related Diarrhea  . Voltaren [Diclofenac Sodium] Nausea And Vomiting    OBJECTIVE: Blood pressure (!) 120/58, pulse 64, temperature 99.2 F (37.3 C), temperature source Oral, resp. rate 18, height 5\' 4"  (1.626 m), weight 104.3 kg, last menstrual period 07/02/1973, SpO2 95 %.  Physical Exam Constitutional:      General: She is not in acute distress.    Appearance: Normal appearance. She is well-developed. She is not ill-appearing or diaphoretic.  HENT:     Head: Normocephalic and atraumatic.     Right Ear: Hearing and external ear normal.     Left Ear: Hearing and external ear normal.     Nose: No nasal deformity  or rhinorrhea.  Eyes:     General: No scleral icterus.    Extraocular Movements: Extraocular movements intact.     Conjunctiva/sclera: Conjunctivae normal.     Right eye: Right conjunctiva is not injected.     Left eye: Left conjunctiva is not injected.  Neck:     Vascular: No JVD.  Cardiovascular:     Rate and Rhythm: Normal rate and regular rhythm.     Heart sounds: S1 normal and S2 normal.  Pulmonary:     Effort: Pulmonary effort is normal. No respiratory distress.  Abdominal:     General: There is no distension.     Palpations: Abdomen is soft.     Tenderness: There is no abdominal tenderness.  Musculoskeletal:        General: Normal range of motion.     Right shoulder: Normal.     Left shoulder: Normal.     Cervical back: Normal range of motion and neck supple.     Right hip: Normal.     Left hip: Normal.     Right knee: Normal.     Left knee: Normal.  Lymphadenopathy:     Head:     Right side of head: No submandibular,  preauricular or posterior auricular adenopathy.     Left side of head: No submandibular, preauricular or posterior auricular adenopathy.     Cervical: No cervical adenopathy.     Right cervical: No superficial or deep cervical adenopathy.    Left cervical: No superficial or deep cervical adenopathy.  Skin:    General: Skin is warm and dry.     Coloration: Skin is not pale.     Findings: No abrasion, bruising, ecchymosis, erythema, lesion or rash.     Nails: There is no clubbing.  Neurological:     Mental Status: She is alert and oriented to person, place, and time.     Sensory: No sensory deficit.     Coordination: Coordination normal.     Gait: Gait normal.  Psychiatric:        Attention and Perception: She is attentive.        Mood and Affect: Mood normal.        Speech: Speech normal.        Behavior: Behavior normal. Behavior is cooperative.        Thought Content: Thought content normal.        Judgment: Judgment normal.    Left foot is with bandage Lab Results Lab Results  Component Value Date   WBC 7.2 07/23/2019   HGB 11.3 (L) 07/23/2019   HCT 35.2 (L) 07/23/2019   MCV 93.9 07/23/2019   PLT 335 07/23/2019    Lab Results  Component Value Date   CREATININE 0.99 07/23/2019   BUN 16 07/23/2019   NA 139 07/23/2019   K 4.4 07/23/2019   CL 104 07/23/2019   CO2 26 07/23/2019    Lab Results  Component Value Date   ALT 13 07/22/2019   AST 18 07/22/2019   ALKPHOS 87 07/22/2019   BILITOT 0.7 07/22/2019     Microbiology: Recent Results (from the past 240 hour(s))  Blood culture (routine x 2)     Status: None (Preliminary result)   Collection Time: 07/22/19 11:50 AM   Specimen: BLOOD  Result Value Ref Range Status   Specimen Description BLOOD LEFT ANTECUBITAL  Final   Special Requests   Final    BOTTLES DRAWN AEROBIC AND ANAEROBIC Blood Culture results may  not be optimal due to an inadequate volume of blood received in culture bottles   Culture   Final    NO  GROWTH < 24 HOURS Performed at [facility] [address] [place], [place] [address]    Report Status PENDING  Incomplete  Blood culture (routine x 2)     Status: None (Preliminary result)   Collection Time: 07/22/19 11:55 AM   Specimen: BLOOD LEFT HAND  Result Value Ref Range Status   Specimen Description BLOOD LEFT HAND  Final   Special Requests   Final    BOTTLES DRAWN AEROBIC AND ANAEROBIC Blood Culture results may not be optimal due to an inadequate volume of blood received in culture bottles   Culture   Final    NO GROWTH < 24 HOURS Performed at [facility], [place] [address], [place], [place] [address]    Report Status PENDING  Incomplete  SARS CORONAVIRUS 2 (TAT 6-24 HRS) Nasopharyngeal Nasopharyngeal Swab     Status: None   Collection Time: 07/22/19  4:12 PM   Specimen: Nasopharyngeal Swab  Result Value Ref Range Status   SARS Coronavirus 2 NEGATIVE NEGATIVE Final    Comment: (NOTE) SARS-CoV-2 target nucleic acids are NOT DETECTED. The SARS-CoV-2 RNA is generally detectable in upper and lower respiratory specimens during the acute phase of infection. Negative results do not preclude SARS-CoV-2 infection, do not rule out co-infections with other pathogens, and should not be used as the sole basis for treatment or other patient management decisions. Negative results must be combined with clinical observations, patient history, and epidemiological information. The expected result is Negative. Fact Sheet for Patients: SugarRoll.be Fact Sheet for Healthcare Providers: https://www.woods-mathews.com/ This test is not yet approved or cleared by the [place] FDA and  has been authorized for detection and/or diagnosis of SARS-CoV-2 by FDA under an Emergency Use Authorization (EUA). This EUA will remain  in effect (meaning this test can be used) for the duration of the COVID-19 declaration under Section 56 4(b)(1) of the  Act, 21 U.S.C. section 360bbb-3(b)(1), unless the authorization is terminated or revoked sooner. Performed at [facility], [place] [address], [place],  [address]     Alcide Evener, [place] [facility] [phone] pager  07/23/2019, 4:16 PM

## 2019-07-24 ENCOUNTER — Other Ambulatory Visit: Payer: Self-pay | Admitting: Physician Assistant

## 2019-07-24 DIAGNOSIS — E118 Type 2 diabetes mellitus with unspecified complications: Secondary | ICD-10-CM

## 2019-07-24 DIAGNOSIS — M86172 Other acute osteomyelitis, left ankle and foot: Secondary | ICD-10-CM | POA: Diagnosis not present

## 2019-07-24 LAB — CBC
HCT: 35.4 % — ABNORMAL LOW (ref 36.0–46.0)
Hemoglobin: 11.2 g/dL — ABNORMAL LOW (ref 12.0–15.0)
MCH: 29 pg (ref 26.0–34.0)
MCHC: 31.6 g/dL (ref 30.0–36.0)
MCV: 91.7 fL (ref 80.0–100.0)
Platelets: 320 10*3/uL (ref 150–400)
RBC: 3.86 MIL/uL — ABNORMAL LOW (ref 3.87–5.11)
RDW: 13.3 % (ref 11.5–15.5)
WBC: 6.8 10*3/uL (ref 4.0–10.5)
nRBC: 0 % (ref 0.0–0.2)

## 2019-07-24 LAB — BASIC METABOLIC PANEL
Anion gap: 9 (ref 5–15)
BUN: 16 mg/dL (ref 8–23)
CO2: 27 mmol/L (ref 22–32)
Calcium: 9.1 mg/dL (ref 8.9–10.3)
Chloride: 106 mmol/L (ref 98–111)
Creatinine, Ser: 0.95 mg/dL (ref 0.44–1.00)
GFR calc Af Amer: >60 mL/min (ref >60)
GFR calc non Af Amer: 60 mL/min — ABNORMAL LOW (ref >60)
Glucose, Bld: 71 mg/dL (ref 70–99)
Potassium: 3.6 mmol/L (ref 3.5–5.1)
Sodium: 142 mmol/L (ref 135–145)

## 2019-07-24 LAB — GLUCOSE, CAPILLARY
Glucose-Capillary: 110 mg/dL — ABNORMAL HIGH (ref 70–99)
Glucose-Capillary: 112 mg/dL — ABNORMAL HIGH (ref 70–99)
Glucose-Capillary: 67 mg/dL — ABNORMAL LOW (ref 70–99)
Glucose-Capillary: 92 mg/dL (ref 70–99)
Glucose-Capillary: 98 mg/dL (ref 70–99)
Glucose-Capillary: 97 mg/dL (ref 70–99)

## 2019-07-24 NOTE — Telephone Encounter (Signed)
Patient hospitalized. PICC, antibiotics, home health to be coordinated while in patient. Landis Gandy, RN

## 2019-07-24 NOTE — Progress Notes (Signed)
Subjective:   Patient and patient's brother are present at the bedside. Patient states that she still has burning pain. She spoke with Dr. Sharol Given this morning, and is scheduled to have an operation to remove part of her heel on Wednesday. Per the patient, Dr. Sharol Given wanted a BKA, but patient refused the recommendation. We discussed curative processes with a BKA due to being unable to have adequate source control. Patient understands risk involved with procedure and not proceeding with BKA. All questions and concerns were addressed.   Objective: CBC Latest Ref Rng & Units 07/24/2019 07/23/2019 07/22/2019  WBC 4.0 - 10.5 K/uL 6.8 7.2 7.4  Hemoglobin 12.0 - 15.0 g/dL 11.2(L) 11.3(L) 11.6(L)  Hematocrit 36.0 - 46.0 % 35.4(L) 35.2(L) 36.3  Platelets 150 - 400 K/uL 320 335 337   BMP Latest Ref Rng & Units 07/24/2019 07/23/2019 07/22/2019  Glucose 70 - 99 mg/dL 71 120(H) 180(H)  BUN 8 - 23 mg/dL 16 16 19   Creatinine 0.44 - 1.00 mg/dL 0.95 0.99 1.08(H)  BUN/Creat Ratio 6 - 22 (calc) - - -  Sodium 135 - 145 mmol/L 142 139 139  Potassium 3.5 - 5.1 mmol/L 3.6 4.4 4.2  Chloride 98 - 111 mmol/L 106 104 104  CO2 22 - 32 mmol/L 27 26 24   Calcium 8.9 - 10.3 mg/dL 9.1 9.2 9.4   Vital signs in last 24 hours: Vitals:   07/23/19 0411 07/23/19 1509 07/23/19 2020 07/24/19 0337  BP: 126/61 (!) 120/58 (!) 130/50 120/60  Pulse: 63 64 67 65  Resp: 18  18 18   Temp: 97.8 F (36.6 C) 99.2 F (37.3 C) (!) 97.5 F (36.4 C) 97.6 F (36.4 C)  TempSrc: Oral Oral Oral Oral  SpO2: 98% 95% 97% 91%  Weight:      Height:       Physical Exam General: Sitting up in bed, NAD HEENT: NCAT CV: RRR, normal S1 and S2, blowing systolic murmur at the right upper sternal border. PULM: Clear in all lung fields ABD: Soft and nontender MSK: Left foot ulcer weeping clear-yellow fluid NEURO: Alert and oriented, nonfocal  Assessment/Plan:  Active Problems:   Acute osteomyelitis of left calcaneus ([place])   Osteomyelitis ([place])  In  summary, Ms. Heckle is a 74 year old female with a past medical history significant for left calcaneus osteomyelitis, [id number] complicated by diabetic foot ulcers, PVD, HTN, HLD, and morbid obesity who presented with worsening left foot pain in the setting of known osteomyelitis that has been unresponsive to p.o. antibiotics in the outpatient setting.  #Osteomyelitis L foot: Multidrug-resistant E. coli growing out of the patient's foot wound on 4/22. There was a plan to start ertapenem IV in the outpatient setting with ID this Thursday, however the patient had not been started on it yet because she needed central line placement. Her pain became so unbearable despite use of tramadol, so the patient was advised to come to the ED. Patient's vital signs continue to be stable, and has no signs of sepsis at this time. MRI shows worsening osteomyelitis in the calcaneus and possibly medial talus.Orthopedics and infectious disease recommend BKA, but the patient is vehemently against this despite explanations of the risks and benefits.  -Consult Ortho, appreciate recommendations  -Dr. Sharol Given to attempt foot salvage intervention with partial calcaneal excision in addition to 6 weeks of IV antibiotics on 5/5 -Consult ID, appreciate recommendations   -Holding off on abx for now to obtain surgical culture results -Dilaudid 0.5-1 mg every 2 hours as needed -PT/OT   #  PVD?: Progress note from 2016 shows that the patient told her provider that she had peripheral vascular disease, however had normal ABIs at this time. Given the extent of her and foot infection and underlying diabetes, think it is reasonable to evaluate this again. -ABIs ordered, will f/u results  #T2DM: Patient takes 70/30 insulin 100 units in the morning and 82 units at night in addition pioglitazone 40 mg daily at home. Hgb A1c 8.6 on admission. -Lantus 45 units BID -Novolog 10 U TID w/ meals -SSI  #HTN #HLD -Metoprolol 50 mg daily -Atorvastatin 40  mg daily  #FEN/GI -Diet: Carb modifed -Fluids: none -Protonix 40 mg daily -Zofran 4 mg q6hrs PRN  #DVT prophylaxis -Lovenox per pharmacy  #CODE STATUS: DNR  #Dispo: suspected d/c later this week or early next week Prior to Admission Living Arrangement: Home Anticipated Discharge Location:  SNF Barriers to [place] workup  Earlene Plater, MD Internal Medicine, PGY1 Pager: [phone]  07/24/2019,11:46 AM

## 2019-07-24 NOTE — Progress Notes (Signed)
Subjective:    Patient is alert, oriented, sitting in bed. Tearful and upset. She is very concerned about the possibility of a foot amputation. "I just want to walk again" Pain is moderate this evening. No other complaints.   Objective:  PE: VITALS:   Vitals:   07/23/19 0009 07/23/19 0411 07/23/19 1509 07/23/19 2020  BP: (!) 104/57 126/61 (!) 120/58 (!) 130/50  Pulse: 64 63 64 67  Resp: 18 18  18   Temp: 98.5 F (36.9 C) 97.8 F (36.6 C) 99.2 F (37.3 C) (!) 97.5 F (36.4 C)  TempSrc: Oral Oral Oral Oral  SpO2: 95% 98% 95% 97%  Weight:      Height:       General: Alert, upset and tearful, but in not acute distress Resp: no use of accessory musculature GI: Abdomen soft, non-tender MSK: EHL and FHL intact, able to move all toes of left foot. Sensation intact. Faint DP pulse. Mild pedal edema. Mild drainage from wound.   LABS  Results for orders placed or performed during the hospital encounter of 07/22/19 (from the past 24 hour(s))  CBC     Status: Abnormal   Collection Time: 07/23/19  3:05 AM  Result Value Ref Range   WBC 7.2 4.0 - 10.5 K/uL   RBC 3.75 (L) 3.87 - 5.11 MIL/uL   Hemoglobin 11.3 (L) 12.0 - 15.0 g/dL   HCT 35.2 (L) 36.0 - 46.0 %   MCV 93.9 80.0 - 100.0 fL   MCH 30.1 26.0 - 34.0 pg   MCHC 32.1 30.0 - 36.0 g/dL   RDW 13.4 11.5 - 15.5 %   Platelets 335 150 - 400 K/uL   nRBC 0.0 0.0 - 0.2 %  Basic metabolic panel     Status: Abnormal   Collection Time: 07/23/19  3:05 AM  Result Value Ref Range   Sodium 139 135 - 145 mmol/L   Potassium 4.4 3.5 - 5.1 mmol/L   Chloride 104 98 - 111 mmol/L   CO2 26 22 - 32 mmol/L   Glucose, Bld 120 (H) 70 - 99 mg/dL   BUN 16 8 - 23 mg/dL   Creatinine, Ser 0.99 0.44 - 1.00 mg/dL   Calcium 9.2 8.9 - 10.3 mg/dL   GFR calc non Af Amer 57 (L) >60 mL/min   GFR calc Af Amer >60 >60 mL/min   Anion gap 9 5 - 15  Glucose, capillary     Status: Abnormal   Collection Time: 07/23/19  7:51 AM  Result Value Ref Range   Glucose-Capillary 112 (H) 70 - 99 mg/dL  Glucose, capillary     Status: None   Collection Time: 07/23/19 12:17 PM  Result Value Ref Range   Glucose-Capillary 98 70 - 99 mg/dL  Glucose, capillary     Status: None   Collection Time: 07/23/19  5:19 PM  Result Value Ref Range   Glucose-Capillary 89 70 - 99 mg/dL  Glucose, capillary     Status: None   Collection Time: 07/23/19  8:22 PM  Result Value Ref Range   Glucose-Capillary 75 70 - 99 mg/dL  Glucose, capillary     Status: None   Collection Time: 07/23/19 10:02 PM  Result Value Ref Range   Glucose-Capillary 96 70 - 99 mg/dL    MR HEEL LEFT W WO CONTRAST  Result Date: 07/22/2019 CLINICAL DATA:  Osteomyelitis involving the left heel EXAM: MRI OF LOWER LEFT EXTREMITY WITHOUT AND WITH CONTRAST TECHNIQUE: Multiplanar, multisequence MR imaging  of the ankle was performed before and after the administration of intravenous contrast. CONTRAST:  81mL GADAVIST GADOBUTROL 1 MMOL/ML IV SOLN COMPARISON:  06/19/2019 FINDINGS: Despite efforts by the technologist and patient, motion artifact is present on today's exam and could not be eliminated. This reduces exam sensitivity and specificity. TENDONS Peroneal: Grossly unremarkable Posteromedial: Distal tibialis posterior tendinopathy. Anterior: Grossly unremarkable Achilles: Minimal distal Achilles tendinopathy. Plantar Fascia: Plantar ulceration penetrates through the medial band of the plantar fascia, with resulting thickening and full-thickness partial width tearing. LIGAMENTS Lateral: Grossly intact Medial: Grossly intact CARTILAGE Ankle Joint: Mild degenerative chondral thinning. Subtalar Joints/Sinus Tarsi: Low-level edema in the sinus tarsi. Bones: There is osteomyelitis of the calcaneus with progressive and extensive abnormal edema especially confluent in the posterior half of the calcaneus. Plantar cutaneous ulceration is present connecting to a 2.5 by 0.9 by 1.3 cm collection of likely infected fluid with  surrounding soft tissue edema just below the plantar calcaneal spur. Mild edema and enhancement in the medial talar head as on image 10/11, increased from previous, possibly related to the adjacent tibialis posterior tendinopathy although early osteomyelitis involving this portion of the talar head cannot be readily excluded given that this appearance was not present on 06/19/2019. Other: In addition to the ulceration and small fluid collection along the plantar calcaneal spur, there is extensive subcutaneous edema and enhancement in the surrounding soft tissues and extending medially, compatible with cellulitis. Dorsal subcutaneous edema along the foot likewise suspicious for cellulitis. IMPRESSION: IMPRESSION 1. Osteomyelitis of the calcaneus and possibly of the medial talar head. 2. Large plantar heel ulceration, extending up through the medial band of the plantar fascia to the calcaneal spur. Small fluid collection just below the calcaneal spur draining to the cutaneous surface. 3. Distal tibialis posterior tendinopathy. 4. Cellulitis along the medial ankle and dorsal foot. Electronically Signed   By: Van Clines M.D.   On: 07/22/2019 14:50    Assessment/Plan: Active Problems:   Osteomyelitis ([place]) - MRI shows osteomyelitis of calcaneous and possibly of medial talar head, large heal ulceration, cellulitis along medial ankle and dorsal foot - antibiotics per ID - Patient is likely to need below-knee amputation, however is very hesitant to do so. She threatened to leave AMA, however after speaking with nurse, is interested in speaking to Dr. Sharol Given tomorrow morning  Contact information:   Weekdays 8-5 Merlene Pulling, PA-C [phone] A fter hours and holidays please check [email or web address] for group call information for [facility] [place] 07/24/2019, 2:12 AM

## 2019-07-24 NOTE — Progress Notes (Signed)
Patient ID: Debra [name], female   DOB: 01/06/1946, 74 y.o.   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  She is scheduled to undergo a calcaneal excision and debridement on 07/26/2019.  Our plan is to keep her off of antibiotics to improve yield of operative cultures. We will follow-up postoperatively.         Michel Bickers, MD [facility] [phone] pager   [phone] cell 07/24/2019, 3:40 PM

## 2019-07-24 NOTE — Evaluation (Signed)
Occupational Therapy Evaluation Patient Details Name: Debra [name] MRN: [id number] DOB: October 29, 1945 Today's Date: 07/24/2019    History of Present Illness 74 y.o. female who complains of left heel pain. 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 Plan for L heel salvage sx 5/5 with Dr. Sharol Given.    Clinical Impression   PTA patient independent using RW for mobility, ADLs seated.  Admitted for above and limited by pain in L heel, decreased activity tolerance, and body habitus.  She completes ADLs with setup to mod assist, squat pivot transfer to recliner with min guard assist due to pain.  She maintains NWB to L LE due to pain during transfer. Discussed home setup, safety and compensatory techniques for ADLs. Planned surgery with Dr. Sharol Given on Wednesday.  Pts ultimate goal is to get back home, but is understanding if SNF is required for safety.  Will follow acutely to optimize independence and safety with ADls, mobility.     Follow Up Recommendations  Home health OT;SNF(pending mobility after surgery)    Equipment Recommendations  3 in 1 bedside commode    Recommendations for Other Services       Precautions / Restrictions Precautions Precautions: Fall Restrictions Weight Bearing Restrictions: No      Mobility Bed Mobility Overal bed mobility: Needs Assistance Bed Mobility: Supine to Sit     Supine to sit: Supervision;HOB elevated     General bed mobility comments: +rail, increased time, supervision for safety  Transfers Overall transfer level: Needs assistance Equipment used: Rolling walker (2 wheeled) Transfers: Squat Pivot Transfers     Squat pivot transfers: Min guard     General transfer comment: squat pivot for pain mgmt, pt maintaining NWB to L LE due to pain     Balance Overall balance assessment: Needs  assistance Sitting-balance support: No upper extremity supported;Feet supported Sitting balance-Leahy Scale: Fair                                     ADL either performed or assessed with clinical judgement   ADL Overall ADL's : Needs assistance/impaired     Grooming: Set up;Sitting   Upper Body Bathing: Set up;Sitting   Lower Body Bathing: Minimal assistance;Sitting/lateral leans   Upper Body Dressing : Set up;Sitting   Lower Body Dressing: Moderate assistance;Sitting/lateral leans   Toilet Transfer: Actuary Details (indicate cue type and reason): simulated to recliner          Functional mobility during ADLs: Min guard General ADL Comments: pt limited by L LE pain and decreased activity tolerance     Vision Baseline Vision/History: Wears glasses Wears Glasses: Reading only Patient Visual Report: No change from baseline       Perception     Praxis      Pertinent Vitals/Pain Pain Assessment: Faces Faces Pain Scale: Hurts little more Pain Location: L LE  Pain Descriptors / Indicators: Numbness Pain Intervention(s): Limited activity within patient's tolerance;Monitored during session;Repositioned     Hand Dominance Right   Extremity/Trunk Assessment Upper Extremity Assessment Upper Extremity Assessment: Overall WFL for tasks assessed   Lower Extremity Assessment Lower Extremity Assessment: Defer to PT evaluation   Cervical / Trunk Assessment Cervical / Trunk Assessment: Kyphotic   Communication Communication Communication: No difficulties  Cognition Arousal/Alertness: Awake/alert Behavior During Therapy: WFL for tasks assessed/performed Overall Cognitive Status: Within Functional Limits for tasks assessed                                     General Comments  pt reports wanting to dc home but understands if she needs SNF after surgery    Exercises     Shoulder Instructions      Home  Living Family/patient expects to be discharged to:: Private residence Living Arrangements: Other relatives(brother) Available Help at Discharge: Family;Available 24 hours/day Type of Home: House Home Access: Stairs to enter [employer] of Steps: 3 Entrance Stairs-Rails: Right Home Layout: One level     Bathroom Shower/Tub: Teacher, early years/pre: Standard     Home Equipment: Cane - single point;Walker - 2 wheels          Prior Functioning/Environment Level of Independence: Needs assistance  Gait / Transfers Assistance Needed: ambulates household distances with RW, brother assists in/out house due to stairs ADL's / Homemaking Assistance Needed: Pt has been sponge bathing. Mod I dressing. Brother assists with iADLs and transportation.            OT Problem List: Decreased strength;Decreased activity tolerance;Impaired balance (sitting and/or standing);Pain;Decreased knowledge of precautions;Decreased knowledge of use of DME or AE;Obesity      OT Treatment/Interventions: Self-care/ADL training;DME and/or AE instruction;Therapeutic activities;Patient/family education;Balance training    OT Goals(Current goals can be found in the care plan section) Acute Rehab OT Goals Patient Stated Goal: home OT Goal Formulation: With patient Time For Goal Achievement: 08/07/19 Potential to Achieve Goals: Good  OT Frequency: Min 2X/week   Barriers to D/C:            Co-evaluation              AM-PAC OT "6 Clicks" Daily Activity     Outcome Measure Help from another person eating meals?: None Help from another person taking care of personal grooming?: A Little Help from another person toileting, which includes using toliet, bedpan, or urinal?: A Lot Help from another person bathing (including washing, rinsing, drying)?: A Little Help from another person to put on and taking off regular upper body clothing?: A Little Help from another person to put on and  taking off regular lower body clothing?: A Lot 6 Click Score: 17   End of Session Nurse Communication: Mobility status  Activity Tolerance: Patient tolerated treatment well Patient left: in chair;with call bell/phone within reach  OT Visit Diagnosis: Other abnormalities of gait and mobility (R26.89);Muscle weakness (generalized) (M62.81);Pain Pain - Right/Left: Left Pain - part of body: Ankle and joints of foot                Time: 1001-1015 OT Time Calculation (min): 14 min Charges:  OT General Charges $OT Visit: 1 Visit OT Evaluation $OT Eval Moderate Complexity: 1 Mod  Jolaine Artist, OT Acute Rehabilitation Services Pager [phone] Office [phone]   Delight Stare 07/24/2019, 12:09 PM

## 2019-07-24 NOTE — H&P (View-Only) (Signed)
ORTHOPAEDIC CONSULTATION  REQUESTING PHYSICIAN: Velna Ochs, MD  Chief Complaint: Left heel ulcer with swelling and cellulitis.  HPI: Debra [name] is a 74 y.o. female who presents with type 2 diabetes with a chronic left heel ulcer she is status post MRI scan.  Past Medical History:  Diagnosis Date  . Acute osteomyelitis of calcaneum, left ([place]) 07/10/2019  . Cellulitis of left leg    history of, most recent episode 01/09  . Chronic venous insufficiency   . 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 Diabetic foot infection ([place]) 07/10/2019  . Diverticulitis    h/o 2-3 episodes in past  . Group B streptococcal infection 07/10/2019  . Hyperlipidemia LDL goal < 100   . Hypertension goal BP (blood pressure) < 130/80   . Leg edema    secondary to chronic venous insufficiency  . Morbid obesity ([place])   . Osteopenia    DEXA scan 7/07  . Pseudomonas aeruginosa infection 07/10/2019   Past Surgical History:  Procedure Laterality Date  . ABDOMINAL HYSTERECTOMY    . APPENDECTOMY    . CHOLECYSTECTOMY    . CHOLECYSTECTOMY N/A 04/14/2013   Procedure: LAPAROSCOPIC CHOLECYSTECTOMY WITH INTRAOPERATIVE CHOLANGIOGRAM;  Surgeon: Adin Hector, MD;  Location: [facility];  Service: General;  Laterality: N/A;  . COLECTOMY     with diverting colsotmy and revision in the 1990's for diverticulitis  . ERCP N/A 04/17/2013   Procedure: ENDOSCOPIC RETROGRADE CHOLANGIOPANCREATOGRAPHY (ERCP);  Surgeon: Irene Shipper, MD;  Location: [facility];  Service: Endoscopy;  Laterality: N/A;  note pt want general anesthesia for this case.  preferto perform in endo unit, but if unavailable please arrange time in OR  . EYE SURGERY  8/13   left cataract removal & retinal repair  . INSERTION OF MESH N/A 07/30/2017   Procedure: INSERTION OF MESH;  Surgeon: Jovita Kussmaul, MD;  Location: [facility];  Service: General;  Laterality: N/A;  . LAPAROSCOPIC LYSIS OF ADHESIONS N/A 04/14/2013   Procedure:  LAPAROSCOPIC LYSIS OF ADHESIONS;  Surgeon: Adin Hector, MD;  Location: [facility];  Service: General;  Laterality: N/A;  . LAPAROTOMY N/A 07/30/2017   Procedure: EXPLORATORY LAPAROTOMY;  Surgeon: Jovita Kussmaul, MD;  Location: [facility];  Service: General;  Laterality: N/A;  . LYSIS OF ADHESION N/A 07/30/2017   Procedure: EXTENSIVE LYSIS OF ADHESION;  Surgeon: Jovita Kussmaul, MD;  Location: [facility];  Service: General;  Laterality: N/A;  . OMENTECTOMY N/A 07/30/2017   Procedure: PARTIAL OMENTECTOMY;  Surgeon: Jovita Kussmaul, MD;  Location: [facility];  Service: General;  Laterality: N/A;  . VENTRAL HERNIA REPAIR N/A 07/30/2017   Procedure: HERNIA REPAIR VENTRAL ADULT;  Surgeon: Jovita Kussmaul, MD;  Location: [facility];  Service: General;  Laterality: N/A;   Social History   Socioeconomic History  . Marital status: Married    Spouse name: Not on file  . Number of children: Not on file  . Years of education: Not on file  . Highest education level: Not on file  Occupational History  . Not on file  Tobacco Use  . Smoking status: Never Smoker  . Smokeless tobacco: Never Used  Substance and Sexual Activity  . Alcohol use: No  . Drug use: No  . Sexual activity: Yes  Other Topics Concern  . Not on file  Social History Narrative   One sister with chronic progressive disease requiring a trach, and 24/7 care   One sister with an  abrupt hospitalization for a critical condition   Social Determinants of Health   Financial Resource Strain:   . Difficulty of Paying Living Expenses:   Food Insecurity:   . Worried About Charity fundraiser in the Last Year:   . Arboriculturist in the Last Year:   Transportation Needs:   . Film/video editor (Medical):   Marland Kitchen Lack of Transportation (Non-Medical):   Physical Activity:   . Days of Exercise per Week:   . Minutes of Exercise per Session:   Stress:   . Feeling of Stress :   Social Connections:   . Frequency of Communication with Friends and Family:   .  Frequency of Social Gatherings with Friends and Family:   . Attends Religious Services:   . Active Member of Clubs or Organizations:   . Attends Archivist Meetings:   Marland Kitchen Marital Status:    Family History  Problem Relation Age of Onset  . Heart disease Mother   . Heart disease Father   . Heart disease Brother   . Cancer Brother   . Cancer Brother   . Hypertension Sister   . Heart disease Sister    - negative except otherwise stated in the family history section Allergies  Allergen Reactions  . Banana Hives  . Hydrocodone Hives  . Neomycin-Polymyxin-Gramicidin Itching and Swelling    Eye drops caused swelling in face and rash and itching on arms and neck  . Penicillins Hives and Itching    Has patient had a PCN reaction causing immediate rash, facial/tongue/throat swelling, SOB or lightheadedness with hypotension: Yes Has patient had a PCN reaction causing severe rash involving mucus membranes or skin necrosis: No Has patient had a PCN reaction that required hospitalization: NO Has patient had a PCN reaction occurring within the last 10 years:NO She has tolerated keflex   . Aspirin Nausea Only    And causes bruises  . Latex Hives and Rash  . Metformin And Related Diarrhea  . Voltaren [Diclofenac Sodium] Nausea And Vomiting   Prior to Admission medications   Medication Sig Start Date End Date Taking? Authorizing Provider  atorvastatin (LIPITOR) 40 MG tablet TAKE 1 TABLET BY MOUTH EVERY DAY Patient taking differently: Take 40 mg by mouth at bedtime.  01/05/17  Yes Lauree Chandler, NP  diphenhydrAMINE (BENADRYL) 25 MG tablet Take 25 mg by mouth at bedtime as needed for itching.   Yes [provider]  ibuprofen (ADVIL) 200 MG tablet Take 400 mg by mouth every 6 (six) hours as needed for headache (pain).   Yes [provider]  insulin NPH-regular Human (70-30) 100 UNIT/ML injection Inject 82-100 Units into the skin See admin instructions. Inject 100  units subcutaneously before breakfast and 82 units before supper   Yes [provider]  naproxen sodium (ALEVE) 220 MG tablet Take 220 mg by mouth daily as needed (pain).   Yes [provider]  omeprazole (PRILOSEC) 40 MG capsule Take 40 mg by mouth daily. 07/03/19  Yes [provider]  pioglitazone (ACTOS) 30 MG tablet Take 30 mg by mouth daily. 06/22/19  Yes [provider]  potassium chloride SA (K-DUR,KLOR-CON) 20 MEQ tablet TAKE 1 TABLET(20 MEQ) BY MOUTH DAILY Patient taking differently: Take 20 mEq by mouth daily.  06/03/16  Yes Reed, Tiffany L, DO  traMADol (ULTRAM) 50 MG tablet Take 50 mg by mouth every 8 (eight) hours as needed (pain).  07/18/19  Yes [provider]  valsartan (DIOVAN) 320 MG tablet Take 320 mg by mouth daily. 03/21/19  Yes [provider]  glucose blood (ACCU-CHEK AVIVA PLUS) test strip Ell.65 check blood sugar twice daily as directed 01/24/16   Reed, Tiffany L, DO  Insulin Pen Needle (BD PEN NEEDLE NANO U/F) 32G X 4 MM MISC Use once daily with the administration of [place]o DX E11.65 02/20/16   Reed, Tiffany L, DO  metFORMIN (GLUCOPHAGE) 500 MG tablet Take 1 tablet (500 mg total) by mouth daily with breakfast. Patient not taking: Reported on 07/22/2019 07/26/17   Cristal Ford, DO  metoprolol succinate (TOPROL-XL) 50 MG 24 hr tablet TAKE 1 TABLET BY MOUTH ONCE DAILY Patient not taking: No sig reported 06/08/16   Reed, Tiffany L, DO  omeprazole (PRILOSEC) 20 MG capsule TAKE 1 CAPSULE BY MOUTH DAILY Patient not taking: No sig reported 03/08/17   Hollace Kinnier L, DO   MR HEEL LEFT W WO CONTRAST  Result Date: 07/22/2019 CLINICAL DATA:  Osteomyelitis involving the left heel EXAM: MRI OF LOWER LEFT EXTREMITY WITHOUT AND WITH CONTRAST TECHNIQUE: Multiplanar, multisequence MR imaging of the ankle was performed before and after the administration of intravenous contrast. CONTRAST:  50mL GADAVIST GADOBUTROL 1 MMOL/ML IV SOLN COMPARISON:   06/19/2019 FINDINGS: Despite efforts by the technologist and patient, motion artifact is present on today's exam and could not be eliminated. This reduces exam sensitivity and specificity. TENDONS Peroneal: Grossly unremarkable Posteromedial: Distal tibialis posterior tendinopathy. Anterior: Grossly unremarkable Achilles: Minimal distal Achilles tendinopathy. Plantar Fascia: Plantar ulceration penetrates through the medial band of the plantar fascia, with resulting thickening and full-thickness partial width tearing. LIGAMENTS Lateral: Grossly intact Medial: Grossly intact CARTILAGE Ankle Joint: Mild degenerative chondral thinning. Subtalar Joints/Sinus Tarsi: Low-level edema in the sinus tarsi. Bones: There is osteomyelitis of the calcaneus with progressive and extensive abnormal edema especially confluent in the posterior half of the calcaneus. Plantar cutaneous ulceration is present connecting to a 2.5 by 0.9 by 1.3 cm collection of likely infected fluid with surrounding soft tissue edema just below the plantar calcaneal spur. Mild edema and enhancement in the medial talar head as on image 10/11, increased from previous, possibly related to the adjacent tibialis posterior tendinopathy although early osteomyelitis involving this portion of the talar head cannot be readily excluded given that this appearance was not present on 06/19/2019. Other: In addition to the ulceration and small fluid collection along the plantar calcaneal spur, there is extensive subcutaneous edema and enhancement in the surrounding soft tissues and extending medially, compatible with cellulitis. Dorsal subcutaneous edema along the foot likewise suspicious for cellulitis. IMPRESSION: IMPRESSION 1. Osteomyelitis of the calcaneus and possibly of the medial talar head. 2. Large plantar heel ulceration, extending up through the medial band of the plantar fascia to the calcaneal spur. Small fluid collection just below the calcaneal spur draining  to the cutaneous surface. 3. Distal tibialis posterior tendinopathy. 4. Cellulitis along the medial ankle and dorsal foot. Electronically Signed   By: Van Clines M.D.   On: 07/22/2019 14:50   - pertinent xrays, CT, MRI studies were reviewed and independently interpreted  Positive ROS: All other systems have been reviewed and were otherwise negative with the exception of those mentioned in the HPI and as above.  Physical Exam: General: Alert, no acute distress Psychiatric: Patient is competent for consent with normal mood and affect Lymphatic: No axillary or cervical lymphadenopathy Cardiovascular: No pedal edema Respiratory: No cyanosis, no use of accessory musculature GI: No organomegaly, abdomen is soft  and non-tender    Images:  @ENCIMAGES @  Labs:  Lab Results  Component Value Date   HGBA1C 8.6 (H) 07/22/2019   HGBA1C 8.9 (H) 07/27/2017   HGBA1C 7.6 (H) 05/22/2016   ESRSEDRATE 22 07/10/2019   ESRSEDRATE 13 02/06/2008   CRP 3.8 07/10/2019   LABURIC 5.5 11/29/2014   LABURIC 4.6 10/01/2014   LABURIC 5.9 06/09/2013   REPTSTATUS PENDING 07/22/2019   GRAMSTAIN  07/13/2019    NO WBC SEEN FEW GRAM POSITIVE RODS Performed at [facility], [place] [address], [place], [place] [address]    CULT  07/22/2019    NO GROWTH < 24 HOURS Performed at [facility] [address], [place], [place] [address]    LABORGA ESCHERICHIA COLI 07/13/2019    Lab Results  Component Value Date   ALBUMIN 3.2 (L) 07/22/2019   ALBUMIN 2.4 (L) 08/02/2017   ALBUMIN 3.1 (L) 07/28/2017   LABURIC 5.5 11/29/2014   LABURIC 4.6 10/01/2014   LABURIC 5.9 06/09/2013    Neurologic: Patient does not have protective sensation bilateral lower extremities.   MUSCULOSKELETAL:   Skin: Examination patient has swelling and cellulitis of the left heel there is foul-smelling drainage from the heel ulcer plantar aspect.  Patient has palpable pulses she does have venous stasis  swelling.  Examination of the MRI scan shows osteomyelitis involving most of the calcaneus.  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  Plan: Patient is extremely adamant against any type of amputation.  She has been seen by Dr. Tommy Medal and Dr. Mardelle Matte who both concur that a amputation is her best option to resolve the infection.  Discussed that we could proceed with attempted foot salvage intervention with partial calcaneal excision 6 weeks of IV antibiotics discussed that she would have to be off her foot for 6 weeks and would need to be in a skilled nursing facility most likely.  She does not have someone to care for her at home.  Discussed there are risks of the wound not healing I have very high risk of persistent infection and risk of C. difficile infection as well as potentially needing a below the knee amputation ultimately.  Patient states she understands the risks she states that she wants to try foot salvage intervention.  She knows that there is a high likelihood that this will not be successful.  Will post her for surgery Wednesday for partial calcaneal excision with bone and soft tissue sent for cultures.  Thank you for the consult and the opportunity to see Debra [name], [place] [phone] 8:11 AM

## 2019-07-24 NOTE — Consult Note (Signed)
ORTHOPAEDIC CONSULTATION  REQUESTING PHYSICIAN: Velna Ochs, MD  Chief Complaint: Left heel ulcer with swelling and cellulitis.  HPI: Debra [name] is a 74 y.o. female who presents with type 2 diabetes with a chronic left heel ulcer she is status post MRI scan.  Past Medical History:  Diagnosis Date  . Acute osteomyelitis of calcaneum, left ([place]) 07/10/2019  . Cellulitis of left leg    history of, most recent episode 01/09  . Chronic venous insufficiency   . Diabetes mellitus with neurological manifestation ([place])   . Diabetic foot infection ([place]) 07/10/2019  . Diverticulitis    h/o 2-3 episodes in past  . Group B streptococcal infection 07/10/2019  . Hyperlipidemia LDL goal < 100   . Hypertension goal BP (blood pressure) < 130/80   . Leg edema    secondary to chronic venous insufficiency  . Morbid obesity ([place])   . Osteopenia    DEXA scan 7/07  . Pseudomonas aeruginosa infection 07/10/2019   Past Surgical History:  Procedure Laterality Date  . ABDOMINAL HYSTERECTOMY    . APPENDECTOMY    . CHOLECYSTECTOMY    . CHOLECYSTECTOMY N/A 04/14/2013   Procedure: LAPAROSCOPIC CHOLECYSTECTOMY WITH INTRAOPERATIVE CHOLANGIOGRAM;  Surgeon: Adin Hector, MD;  Location: [facility];  Service: General;  Laterality: N/A;  . COLECTOMY     with diverting colsotmy and revision in the 1990's for diverticulitis  . ERCP N/A 04/17/2013   Procedure: ENDOSCOPIC RETROGRADE CHOLANGIOPANCREATOGRAPHY (ERCP);  Surgeon: Irene Shipper, MD;  Location: [facility];  Service: Endoscopy;  Laterality: N/A;  note pt want general anesthesia for this case.  preferto perform in endo unit, but if unavailable please arrange time in OR  . EYE SURGERY  8/13   left cataract removal & retinal repair  . INSERTION OF MESH N/A 07/30/2017   Procedure: INSERTION OF MESH;  Surgeon: Jovita Kussmaul, MD;  Location: [facility];  Service: General;  Laterality: N/A;  . LAPAROSCOPIC LYSIS OF ADHESIONS N/A 04/14/2013   Procedure:  LAPAROSCOPIC LYSIS OF ADHESIONS;  Surgeon: Adin Hector, MD;  Location: [facility];  Service: General;  Laterality: N/A;  . LAPAROTOMY N/A 07/30/2017   Procedure: EXPLORATORY LAPAROTOMY;  Surgeon: Jovita Kussmaul, MD;  Location: [facility];  Service: General;  Laterality: N/A;  . LYSIS OF ADHESION N/A 07/30/2017   Procedure: EXTENSIVE LYSIS OF ADHESION;  Surgeon: Jovita Kussmaul, MD;  [facility];  Service: General;  Laterality: N/A;  . OMENTECTOMY N/A 07/30/2017   Procedure: PARTIAL OMENTECTOMY;  Surgeon: Jovita Kussmaul, MD;  Location: [facility];  Service: General;  Laterality: N/A;  . VENTRAL HERNIA REPAIR N/A 07/30/2017   Procedure: HERNIA REPAIR VENTRAL ADULT;  Surgeon: Jovita Kussmaul, MD;  Location[facility];  Service: General;  Laterality: N/A;   Social History   Socioeconomic History  . Marital status: Married    Spouse name: Not on file  . Number of children: Not on file  . Years of education: Not on file  . Highest education level: Not on file  Occupational History  . Not on file  Tobacco Use  . Smoking status: Never Smoker  . Smokeless tobacco: Never Used  Substance and Sexual Activity  . Alcohol use: No  . Drug use: No  . Sexual activity: Yes  Other Topics Concern  . Not on file  Social History Narrative   One sister with chronic progressive disease requiring a trach, and 24/7 care   One sister with an  abrupt hospitalization for a critical condition   Social Determinants of Health   Financial Resource Strain:   . Difficulty of Paying Living Expenses:   Food Insecurity:   . Worried About Charity fundraiser in the Last Year:   . Arboriculturist in the Last Year:   Transportation Needs:   . Film/video editor (Medical):   Marland Kitchen Lack of Transportation (Non-Medical):   Physical Activity:   . Days of Exercise per Week:   . Minutes of Exercise per Session:   Stress:   . Feeling of Stress :   Social Connections:   . Frequency of Communication with Friends and Family:   .  Frequency of Social Gatherings with Friends and Family:   . Attends Religious Services:   . Active Member of Clubs or Organizations:   . Attends Archivist Meetings:   Marland Kitchen Marital Status:    Family History  Problem Relation Age of Onset  . Heart disease Mother   . Heart disease Father   . Heart disease Brother   . Cancer Brother   . Cancer Brother   . Hypertension Sister   . Heart disease Sister    - negative except otherwise stated in the family history section Allergies  Allergen Reactions  . Banana Hives  . Hydrocodone Hives  . Neomycin-Polymyxin-Gramicidin Itching and Swelling    Eye drops caused swelling in face and rash and itching on arms and neck  . Penicillins Hives and Itching    Has patient had a PCN reaction causing immediate rash, facial/tongue/throat swelling, SOB or lightheadedness with hypotension: Yes Has patient had a PCN reaction causing severe rash involving mucus membranes or skin necrosis: No Has patient had a PCN reaction that required hospitalization: NO Has patient had a PCN reaction occurring within the last 10 years:NO She has tolerated keflex   . Aspirin Nausea Only    And causes bruises  . Latex Hives and Rash  . Metformin And Related Diarrhea  . Voltaren [Diclofenac Sodium] Nausea And Vomiting   Prior to Admission medications   Medication Sig Start Date End Date Taking? Authorizing Provider  atorvastatin (LIPITOR) 40 MG tablet TAKE 1 TABLET BY MOUTH EVERY DAY Patient taking differently: Take 40 mg by mouth at bedtime.  01/05/17  Yes Lauree Chandler, NP  diphenhydrAMINE (BENADRYL) 25 MG tablet Take 25 mg by mouth at bedtime as needed for itching.   Yes [provider]  ibuprofen (ADVIL) 200 MG tablet Take 400 mg by mouth every 6 (six) hours as needed for headache (pain).   Yes [provider]  insulin NPH-regular Human (70-30) 100 UNIT/ML injection Inject 82-100 Units into the skin See admin instructions. Inject 100  units subcutaneously before breakfast and 82 units before supper   Yes [provider]  naproxen sodium (ALEVE) 220 MG tablet Take 220 mg by mouth daily as needed (pain).   Yes [provider]  omeprazole (PRILOSEC) 40 MG capsule Take 40 mg by mouth daily. 07/03/19  Yes [provider]  pioglitazone (ACTOS) 30 MG tablet Take 30 mg by mouth daily. 06/22/19  Yes [provider]  potassium chloride SA (K-DUR,KLOR-CON) 20 MEQ tablet TAKE 1 TABLET(20 MEQ) BY MOUTH DAILY Patient taking differently: Take 20 mEq by mouth daily.  06/03/16  Yes Reed, Tiffany L, DO  traMADol (ULTRAM) 50 MG tablet Take 50 mg by mouth every 8 (eight) hours as needed (pain).  07/18/19  Yes [provider]  valsartan (DIOVAN) 320 MG tablet Take 320 mg by mouth daily. 03/21/19  Yes [provider]  glucose blood (ACCU-CHEK AVIVA PLUS) test strip Ell.65 check blood sugar twice daily as directed 01/24/16   Reed, Tiffany L, DO  Insulin Pen Needle (BD PEN NEEDLE NANO U/F) 32G X 4 MM MISC Use once daily with the administration of Toujeo DX E11.65 02/20/16   Reed, Tiffany L, DO  metFORMIN (GLUCOPHAGE) 500 MG tablet Take 1 tablet (500 mg total) by mouth daily with breakfast. Patient not taking: Reported on 07/22/2019 07/26/17   Cristal Ford, DO  metoprolol succinate (TOPROL-XL) 50 MG 24 hr tablet TAKE 1 TABLET BY MOUTH ONCE DAILY Patient not taking: No sig reported 06/08/16   Reed, Tiffany L, DO  omeprazole (PRILOSEC) 20 MG capsule TAKE 1 CAPSULE BY MOUTH DAILY Patient not taking: No sig reported 03/08/17   Hollace Kinnier L, DO   MR HEEL LEFT W WO CONTRAST  Result Date: 07/22/2019 CLINICAL DATA:  Osteomyelitis involving the left heel EXAM: MRI OF LOWER LEFT EXTREMITY WITHOUT AND WITH CONTRAST TECHNIQUE: Multiplanar, multisequence MR imaging of the ankle was performed before and after the administration of intravenous contrast. CONTRAST:  69mL GADAVIST GADOBUTROL 1 MMOL/ML IV SOLN COMPARISON:   06/19/2019 FINDINGS: Despite efforts by the technologist and patient, motion artifact is present on today's exam and could not be eliminated. This reduces exam sensitivity and specificity. TENDONS Peroneal: Grossly unremarkable Posteromedial: Distal tibialis posterior tendinopathy. Anterior: Grossly unremarkable Achilles: Minimal distal Achilles tendinopathy. Plantar Fascia: Plantar ulceration penetrates through the medial band of the plantar fascia, with resulting thickening and full-thickness partial width tearing. LIGAMENTS Lateral: Grossly intact Medial: Grossly intact CARTILAGE Ankle Joint: Mild degenerative chondral thinning. Subtalar Joints/Sinus Tarsi: Low-level edema in the sinus tarsi. Bones: There is osteomyelitis of the calcaneus with progressive and extensive abnormal edema especially confluent in the posterior half of the calcaneus. Plantar cutaneous ulceration is present connecting to a 2.5 by 0.9 by 1.3 cm collection of likely infected fluid with surrounding soft tissue edema just below the plantar calcaneal spur. Mild edema and enhancement in the medial talar head as on image 10/11, increased from previous, possibly related to the adjacent tibialis posterior tendinopathy although early osteomyelitis involving this portion of the talar head cannot be readily excluded given that this appearance was not present on 06/19/2019. Other: In addition to the ulceration and small fluid collection along the plantar calcaneal spur, there is extensive subcutaneous edema and enhancement in the surrounding soft tissues and extending medially, compatible with cellulitis. Dorsal subcutaneous edema along the foot likewise suspicious for cellulitis. IMPRESSION: IMPRESSION 1. Osteomyelitis of the calcaneus and possibly of the medial talar head. 2. Large plantar heel ulceration, extending up through the medial band of the plantar fascia to the calcaneal spur. Small fluid collection just below the calcaneal spur draining  to the cutaneous surface. 3. Distal tibialis posterior tendinopathy. 4. Cellulitis along the medial ankle and dorsal foot. Electronically Signed   By: Van Clines M.D.   On: 07/22/2019 14:50   - pertinent xrays, CT, MRI studies were reviewed and independently interpreted  Positive ROS: All other systems have been reviewed and were otherwise negative with the exception of those mentioned in the HPI and as above.  Physical Exam: General: Alert, no acute distress Psychiatric: Patient is competent for consent with normal mood and affect Lymphatic: No axillary or cervical lymphadenopathy Cardiovascular: No pedal edema Respiratory: No cyanosis, no use of accessory musculature GI: No organomegaly, abdomen is soft  and non-tender    Images:  @ENCIMAGES @  Labs:  Lab Results  Component Value Date   HGBA1C 8.6 (H) 07/22/2019   HGBA1C 8.9 (H) 07/27/2017   HGBA1C 7.6 (H) 05/22/2016   ESRSEDRATE 22 07/10/2019   ESRSEDRATE 13 02/06/2008   CRP 3.8 07/10/2019   LABURIC 5.5 11/29/2014   LABURIC 4.6 10/01/2014   LABURIC 5.9 06/09/2013   REPTSTATUS PENDING 07/22/2019   GRAMSTAIN  07/13/2019    NO WBC SEEN FEW GRAM POSITIVE RODS Performed at [facility], [place] [address] [place], [place] [address]    CULT  07/22/2019    NO GROWTH < 24 HOURS Performed at [facility] [address] [place], [place] [address]    LABORGA ESCHERICHIA COLI 07/13/2019    Lab Results  Component Value Date   ALBUMIN 3.2 (L) 07/22/2019   ALBUMIN 2.4 (L) 08/02/2017   ALBUMIN 3.1 (L) 07/28/2017   LABURIC 5.5 11/29/2014   LABURIC 4.6 10/01/2014   LABURIC 5.9 06/09/2013    Neurologic: Patient does not have protective sensation bilateral lower extremities.   MUSCULOSKELETAL:   Skin: Examination patient has swelling and cellulitis of the left heel there is foul-smelling drainage from the heel ulcer plantar aspect.  Patient has palpable pulses she does have venous stasis  swelling.  Examination of the MRI scan shows osteomyelitis involving most of the calcaneus.  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  Plan: Patient is extremely adamant against any type of amputation.  She has been seen by Dr. Tommy Medal and Dr. Mardelle Matte who both concur that a amputation is her best option to resolve the infection.  Discussed that we could proceed with attempted foot salvage intervention with partial calcaneal excision 6 weeks of IV antibiotics discussed that she would have to be off her foot for 6 weeks and would need to be in a skilled nursing facility most likely.  She does not have someone to care for her at home.  Discussed there are risks of the wound not healing I have very high risk of persistent infection and risk of C. difficile infection as well as potentially needing a below the knee amputation ultimately.  Patient states she understands the risks she states that she wants to try foot salvage intervention.  She knows that there is a high likelihood that this will not be successful.  Will post her for surgery Wednesday for partial calcaneal excision with bone and soft tissue sent for cultures.  Thank you for the consult and the opportunity to see Ms. Orlene Och, [place] [phone] 8:11 AM

## 2019-07-24 NOTE — Telephone Encounter (Signed)
RN left message with [name] in IR to let her know [name] is hospitalized and will not need the IR appointment for PICC placement, cancelled outpatient order for PICC placement. Landis Gandy, RN

## 2019-07-25 ENCOUNTER — Encounter (HOSPITAL_COMMUNITY): Payer: Medicare Other

## 2019-07-25 LAB — BASIC METABOLIC PANEL
Anion gap: 8 (ref 5–15)
BUN: 16 mg/dL (ref 8–23)
CO2: 26 mmol/L (ref 22–32)
Calcium: 9.5 mg/dL (ref 8.9–10.3)
Chloride: 105 mmol/L (ref 98–111)
Creatinine, Ser: 1.11 mg/dL — ABNORMAL HIGH (ref 0.44–1.00)
GFR calc Af Amer: 57 mL/min — ABNORMAL LOW (ref >60)
GFR calc non Af Amer: 50 mL/min — ABNORMAL LOW (ref >60)
Glucose, Bld: 107 mg/dL — ABNORMAL HIGH (ref 70–99)
Potassium: 3.7 mmol/L (ref 3.5–5.1)
Sodium: 139 mmol/L (ref 135–145)

## 2019-07-25 LAB — SURGICAL PCR SCREEN
MRSA, PCR: NEGATIVE
Staphylococcus aureus: NEGATIVE

## 2019-07-25 LAB — GLUCOSE, CAPILLARY
Glucose-Capillary: 116 mg/dL — ABNORMAL HIGH (ref 70–99)
Glucose-Capillary: 165 mg/dL — ABNORMAL HIGH (ref 70–99)
Glucose-Capillary: 155 mg/dL — ABNORMAL HIGH (ref 70–99)
Glucose-Capillary: 111 mg/dL — ABNORMAL HIGH (ref 70–99)

## 2019-07-25 LAB — CBC
HCT: 37.2 % (ref 36.0–46.0)
Hemoglobin: 12.1 g/dL (ref 12.0–15.0)
MCH: 30.1 pg (ref 26.0–34.0)
MCHC: 32.5 g/dL (ref 30.0–36.0)
MCV: 92.5 fL (ref 80.0–100.0)
Platelets: 344 10*3/uL (ref 150–400)
RBC: 4.02 MIL/uL (ref 3.87–5.11)
RDW: 13.2 % (ref 11.5–15.5)
WBC: 7.6 10*3/uL (ref 4.0–10.5)
nRBC: 0 % (ref 0.0–0.2)

## 2019-07-25 MED ORDER — CLINDAMYCIN PHOSPHATE 900 MG/50ML IV SOLN
900.0000 mg | INTRAVENOUS | Status: AC
  Administered 2019-07-26: 900 mg via INTRAVENOUS
  Filled 2019-07-25: qty 50

## 2019-07-25 MED ORDER — HYDROMORPHONE HCL 1 MG/ML IJ SOLN
0.5000 mg | INTRAMUSCULAR | Status: DC | PRN
  Administered 2019-07-25: 1 mg via INTRAVENOUS
  Administered 2019-07-26: 0.5 mg via INTRAVENOUS
  Administered 2019-07-26 – 2019-07-27 (×4): 1 mg via INTRAVENOUS
  Filled 2019-07-25 (×6): qty 1

## 2019-07-25 NOTE — Progress Notes (Signed)
Physical Therapy Treatment Patient Details Name: Debra [name] MRN: [id number] DOB: 06-Jun-1945 Today's Date: 07/25/2019    History of Present Illness 74 y.o. female who complains of left heel pain. 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 Plan for L heel salvage sx 5/5 with Dr. Sharol Given.     PT Comments    Pt required coaxing to complete PT and declined to amb due to c/o dizziness and nausea. Orthostatic vitals taken and found WNL. Pt educated on WB precautions after surgery, therapeutic exercise and use of wheelchair. Pt required verbal reinforcement of Pt education regarding WB precautions for after surgery schedule for 5/5. Increased time discussing d/c recs and potential for difficulty mobilizing with likely new NWB precautions post-op tomorrow; pt continues to declined SNF-level therapies and is confident she can "make it work" upon return home with brother's assist. Pt remains appropriate for physical therapy while in acute care setting to improve functional mobility until next appropriate level of care.   BP reclined in chair 136/78 BP seated edge of chair (symptomatic) 134/74 BP standing 134/89   Follow Up Recommendations  SNF;Supervision/Assistance - 24 hour(Declined SNF, will need HH PT)     Equipment Recommendations  Wheelchair cushion (measurements PT);Wheelchair (measurements PT);3in1 (PT)    Recommendations for Other Services       Precautions / Restrictions Precautions Precautions: Fall Restrictions Weight Bearing Restrictions: No    Mobility  Bed Mobility                  Transfers Overall transfer level: Needs assistance Equipment used: Rolling walker (2 wheeled) Transfers: Sit to/from Stand Sit to Stand: Min assist         General transfer comment: Pt reports increased dizziness today.  Sit to stand x1 to check orthostatic vitals.  Ambulation/Gait                 Stairs             Wheelchair Mobility    Modified Rankin (Stroke Patients Only)       Balance Overall balance assessment: Needs assistance Sitting-balance support: Feet supported Sitting balance-Leahy Scale: Fair Sitting balance - Comments: Pt sitting in recliner   Standing balance support: Bilateral upper extremity supported;During functional activity Standing balance-Leahy Scale: Poor Standing balance comment: Pt reliant on UE support, Pt required min guard in standing                            Cognition Arousal/Alertness: Awake/alert Behavior During Therapy: WFL for tasks assessed/performed Overall Cognitive Status: Within Functional Limits for tasks assessed                                        Exercises General Exercises - Lower Extremity Ankle Circles/Pumps: AROM;10 reps;Both;Seated(2x10) Long Arc Quad: AROM;5 reps;Seated(2x5) Hip Flexion/Marching: Seated;AROM;10 reps;Both(2 sets)    General Comments General comments (skin integrity, edema, etc.): Pt reports she is unable to amb today due to increased dizziness. Pt educated on need to learn how to transfer and amb with LLE NWB after surgery. Discussed potential for family to have ramp built, pt reports "I've made it up those steps many times before, I'll be able to do it  again." Multiple attempts made to educated on reality of NWB precautions.      Pertinent Vitals/Pain Pain Assessment: Faces Faces Pain Scale: Hurts little more Pain Location: L LE  Pain Descriptors / Indicators: Discomfort;Grimacing Pain Intervention(s): Limited activity within patient's tolerance    Home Living                      Prior Function            PT Goals (current goals can now be found in the care plan section) Acute Rehab PT Goals Patient Stated Goal: home PT Goal Formulation: With  patient Time For Goal Achievement: 08/06/19 Potential to Achieve Goals: Fair Progress towards PT goals: Progressing toward goals    Frequency    Min 3X/week      PT Plan Discharge plan needs to be updated;Equipment recommendations need to be updated    Co-evaluation              AM-PAC PT "6 Clicks" Mobility   Outcome Measure  Help needed turning from your back to your side while in a flat bed without using bedrails?: None Help needed moving from lying on your back to sitting on the side of a flat bed without using bedrails?: A Little Help needed moving to and from a bed to a chair (including a wheelchair)?: A Little Help needed standing up from a chair using your arms (e.g., wheelchair or bedside chair)?: A Little Help needed to walk in hospital room?: A Little Help needed climbing 3-5 steps with a railing? : A Lot 6 Click Score: 18    End of Session Equipment Utilized During Treatment: Gait belt Activity Tolerance: Patient limited by pain Patient left: in chair;with call bell/phone within reach Nurse Communication: Mobility status PT Visit Diagnosis: Pain;Difficulty in walking, not elsewhere classified (R26.2) Pain - Right/Left: Left Pain - part of body: Ankle and joints of foot     Time: 1423-1446 PT Time Calculation (min) (ACUTE ONLY): 23 min  Charges:  $Therapeutic Exercise: 8-22 mins $Self Care/Home Management: 8-22                     Rolland Porter SPT 07/25/2019    Rolland Porter 07/25/2019, 4:48 PM

## 2019-07-25 NOTE — TOC Progression Note (Addendum)
Transition of Care [facility]) - Progression Note    Patient Details  Name: LA BROZYNA MRN: [id number] Date of Birth: [birth year]  Transition of [facility]) CM/SW Contact  Jacalyn Lefevre Edson Snowball, RN Phone Number: 07/25/2019, 10:50 AM  Clinical Narrative:    Confirmed face sheet information with patient at bedside. Patient from home. Her brother lives with her and he can provide 24 hour assistance.   Patient already has walker at home. Patient does want 3 in 1 and wheel chair. 3 in 1 has been recommended . Asked PT who is down to see her today regarding wheel chair.   Discussed home health PT,OT, and RN. Provided medicare.gov list of agencies.   Patient [facility]: [facility] or [facility]. She does NOT want [facility] or [facility].   Referral given to [place] with [facility] she is reviewing, awaiting call back.   Patient voiced understanding that she and her brother will be taught how to administer IV ABX , HHRN will not be there every time a dose is due.     Tiffany with [facility] is unable  to accept due to staffing.  Butch Penny with [facility] is unable to accept due to staffing.   [place] with [facility] unable to accept due to staffing.  Patient aware and has no preference  Hoyle Sauer with [facility] unable to accept due to insurance.   Referral given to [facility] with [facility], awaiting call back . [facility] accepted referral for HHRN and HHPT   Ordered 3 in 1 , and wheel chair ordered through [place] with [facility]  Expected Discharge Plan: [place] Barriers to Discharge: Continued Medical Work up  Expected Discharge Plan and Services Expected Discharge Plan: [place]   Discharge Planning Services: CM Consult Post Acute Care Choice: Home Health, Durable Medical Equipment Living arrangements for the past 2 months: Single Family Home                 DME Arranged: 3-N-1, Walker rolling DME Agency: [facility]osp[facility]  and [place]: PT, OT, RN [place] Agency: [facility] ([facility]) Date [place]: 07/25/19 Time [place]: 71 Representative spoke with at [place]: [place], [place] reviewing will call NCM back   Social Determinants of Health (SDOH) Interventions    Readmission Risk Interventions No flowsheet data found.

## 2019-07-25 NOTE — Progress Notes (Signed)
Subjective:   Patient and patient's brother are present at the bedside. She did not sleep well last night due to being sweaty which she attributes to the room being too warm. She denies fevers, chills, or worsening foot pain. She looks forward to getting surgery over with to find out what antibiotics she should be on.   Objective: CBC Latest Ref Rng & Units 07/25/2019 07/24/2019 07/23/2019  WBC 4.0 - 10.5 K/uL 7.6 6.8 7.2  Hemoglobin 12.0 - 15.0 g/dL 12.1 11.2(L) 11.3(L)  Hematocrit 36.0 - 46.0 % 37.2 35.4(L) 35.2(L)  Platelets 150 - 400 K/uL 344 320 335   BMP Latest Ref Rng & Units 07/25/2019 07/24/2019 07/23/2019  Glucose 70 - 99 mg/dL 107(H) 71 120(H)  BUN 8 - 23 mg/dL 16 16 16   Creatinine 0.44 - 1.00 mg/dL 1.11(H) 0.95 0.99  BUN/Creat Ratio 6 - 22 (calc) - - -  Sodium 135 - 145 mmol/L 139 142 139  Potassium 3.5 - 5.1 mmol/L 3.7 3.6 4.4  Chloride 98 - 111 mmol/L 105 106 104  CO2 22 - 32 mmol/L 26 27 26   Calcium 8.9 - 10.3 mg/dL 9.5 9.1 9.2   Vital signs in last 24 hours: Vitals:   07/24/19 1354 07/24/19 2115 07/25/19 0430 07/25/19 0454  BP: (!) 147/85 122/66 (!) 110/59 126/68  Pulse: 70 70 68 71  Resp: 16 17 17 17   Temp: 98.7 F (37.1 C) 99.2 F (37.3 C) 97.7 F (36.5 C) 98.9 F (37.2 C)  TempSrc: Oral Oral  Oral  SpO2: 98% 95% 96% 99%  Weight:      Height:       Physical Exam General: Sitting up in bed, NAD HEENT: NCAT CV: RRR, normal S1 and S2, blowing systolic murmur at the right upper sternal border PULM: Clear to auscultation bilaterally in all fields ABD: Soft and nontender NEURO: Alert and oriented, nonfocal  Assessment/Plan:  Active Problems:   Controlled type 2 diabetes mellitus with complication, with long-term current use of insulin (HCC)   Acute osteomyelitis of left calcaneus (HCC)   Osteomyelitis (HCC)  In summary, Ms. Mcbeth is a 74 year old female with a past medical history significant for left calcaneus osteomyelitis, [id number] complicated by diabetic foot  ulcers, PVD, HTN, HLD, and morbid obesity who presented with worsening left foot pain in the setting of known osteomyelitis that has been unresponsive to p.o. antibiotics in the outpatient setting.  #Osteomyelitis L foot: Multidrug-resistant E. coli growing out of the patient's foot wound on 4/22. There was a plan to start ertapenem IV in the outpatient setting with ID this Thursday, however the patient had not been started on it yet because she needed central line placement. Her pain became so unbearable despite use of tramadol, so the patient was advised to come to the ED. Patient's vital signs continue to be stable, and has no signs of sepsis at this time. MRI shows worsening osteomyelitis in the calcaneus and possibly medial talus.Orthopedics and infectious disease recommend BKA, but the patient is vehemently against this despite explanations of the risks and benefits.  -Consult Ortho, appreciate recommendations  -Dr. Sharol Given to attempt foot salvage intervention with partial calcaneal excision in addition to 6 weeks of IV antibiotics on 5/5 -Consult ID, appreciate recommendations   -Holding off on abx for now to obtain surgical culture results -Dilaudid 0.5-1 mg every 2 hours as needed -PT/OT   #PVD?: Progress note from 2016 shows that the patient told her provider that she had peripheral vascular disease, however  had normal ABIs at this time. Given the extent of her and foot infection and underlying diabetes, think it is reasonable to evaluate this again. -ABIs ordered, will f/u results  #T2DM: Patient takes 70/30 insulin 100 units in the morning and 82 units at night in addition pioglitazone 40 mg daily at home. Hgb A1c 8.6 on admission. -Lantus 45 units BID -Novolog 10 U TID w/ meals -SSI  #HTN #HLD -Metoprolol 50 mg daily -Atorvastatin 40 mg daily  #FEN/GI -Diet: Carb modifed, NPO at midnight -Fluids: None -Protonix 40 mg daily -Zofran 4 mg q6hrs PRN  #DVT prophylaxis -Lovenox  per pharmacy  #CODE STATUS: DNR  #Dispo: Suspected d/c later this week or early next week Prior to Admission Living Arrangement: Home Anticipated Discharge Location:  SNF Barriers to [place] workup  Earlene Plater, MD Internal Medicine, PGY1 Pager: [phone]  07/25/2019,7:12 AM

## 2019-07-25 NOTE — Care Management (Signed)
    Durable Medical Equipment  (From admission, onward)         Start     Ordered   07/25/19 1056  For home use only DME lightweight manual wheelchair with seat cushion  Once    Comments: Patient suffers from  Osteomyelitis L foot which impairs their ability to perform daily activities like ambulating  in the home.  A cane  will not resolve  issue with performing activities of daily living. A wheelchair will allow patient to safely perform daily activities. Patient is not able to propel themselves in the home using a standard weight wheelchair due to Osteomyelitis L foot. Patient can self propel in the lightweight wheelchair. Length of need lifetime . Accessories: elevating leg rests (ELRs), wheel locks, extensions and anti-tippers.  Seat and back cushions   07/25/19 1056   07/25/19 1045  For home use only DME 3 n 1  Once     07/25/19 1046

## 2019-07-26 ENCOUNTER — Encounter (HOSPITAL_COMMUNITY)
Admission: EM | Disposition: A | Discharge status: Skilled Nursing Facility | Payer: Self-pay | Source: Home / Self Care | Attending: Internal Medicine

## 2019-07-26 ENCOUNTER — Ambulatory Visit: Payer: Medicare Other | Admitting: Internal Medicine

## 2019-07-26 ENCOUNTER — Encounter (HOSPITAL_COMMUNITY): Payer: Self-pay | Admitting: Internal Medicine

## 2019-07-26 ENCOUNTER — Inpatient Hospital Stay (HOSPITAL_COMMUNITY): Payer: Medicare Other | Admitting: Anesthesiology

## 2019-07-26 DIAGNOSIS — M86172 Other acute osteomyelitis, left ankle and foot: Secondary | ICD-10-CM | POA: Diagnosis not present

## 2019-07-26 DIAGNOSIS — L02612 Cutaneous abscess of left foot: Secondary | ICD-10-CM

## 2019-07-26 HISTORY — PX: I & D EXTREMITY: SHX5045

## 2019-07-26 LAB — GLUCOSE, CAPILLARY
Glucose-Capillary: 86 mg/dL (ref 70–99)
Glucose-Capillary: 85 mg/dL (ref 70–99)
Glucose-Capillary: 86 mg/dL (ref 70–99)
Glucose-Capillary: 86 mg/dL (ref 70–99)
Glucose-Capillary: 53 mg/dL — ABNORMAL LOW (ref 70–99)
Glucose-Capillary: 66 mg/dL — ABNORMAL LOW (ref 70–99)
Glucose-Capillary: 105 mg/dL — ABNORMAL HIGH (ref 70–99)

## 2019-07-26 LAB — BASIC METABOLIC PANEL
Anion gap: 15 (ref 5–15)
BUN: 21 mg/dL (ref 8–23)
CO2: 22 mmol/L (ref 22–32)
Calcium: 9.1 mg/dL (ref 8.9–10.3)
Chloride: 104 mmol/L (ref 98–111)
Creatinine, Ser: 0.91 mg/dL (ref 0.44–1.00)
GFR calc Af Amer: >60 mL/min (ref >60)
GFR calc non Af Amer: >60 mL/min (ref >60)
Glucose, Bld: 96 mg/dL (ref 70–99)
Potassium: 4.1 mmol/L (ref 3.5–5.1)
Sodium: 141 mmol/L (ref 135–145)

## 2019-07-26 LAB — CBC
HCT: 37.7 % (ref 36.0–46.0)
Hemoglobin: 12 g/dL (ref 12.0–15.0)
MCH: 29.8 pg (ref 26.0–34.0)
MCHC: 31.8 g/dL (ref 30.0–36.0)
MCV: 93.5 fL (ref 80.0–100.0)
Platelets: 283 10*3/uL (ref 150–400)
RBC: 4.03 MIL/uL (ref 3.87–5.11)
RDW: 13.4 % (ref 11.5–15.5)
WBC: 7.1 10*3/uL (ref 4.0–10.5)
nRBC: 0 % (ref 0.0–0.2)

## 2019-07-26 SURGERY — IRRIGATION AND DEBRIDEMENT EXTREMITY
Anesthesia: Monitor Anesthesia Care | Site: Foot | Laterality: Left

## 2019-07-26 MED ORDER — FENTANYL CITRATE (PF) 100 MCG/2ML IJ SOLN
INTRAMUSCULAR | Status: AC
  Administered 2019-07-26: 100 ug via INTRAVENOUS
  Filled 2019-07-26: qty 2

## 2019-07-26 MED ORDER — ONDANSETRON HCL 4 MG/2ML IJ SOLN
INTRAMUSCULAR | Status: AC
  Filled 2019-07-26: qty 4

## 2019-07-26 MED ORDER — ACETAMINOPHEN 160 MG/5ML PO SOLN: 325.0000 mg | ORAL | Status: DC | PRN

## 2019-07-26 MED ORDER — OXYCODONE HCL 5 MG PO TABS: 5.0000 mg | ORAL_TABLET | Freq: Once | ORAL | Status: DC | PRN

## 2019-07-26 MED ORDER — FENTANYL CITRATE (PF) 100 MCG/2ML IJ SOLN
25.0000 ug | INTRAMUSCULAR | Status: DC | PRN
  Administered 2019-07-26: 25 ug via INTRAVENOUS

## 2019-07-26 MED ORDER — DEXTROSE 50 % IV SOLN
INTRAVENOUS | Status: AC
  Filled 2019-07-26: qty 50

## 2019-07-26 MED ORDER — BUPIVACAINE LIPOSOME 1.3 % IJ SUSP
INTRAMUSCULAR | Status: DC | PRN
Start: 2019-07-26 — End: 2019-07-26
  Administered 2019-07-26: 10 mL via PERINEURAL

## 2019-07-26 MED ORDER — PROPOFOL 500 MG/50ML IV EMUL
INTRAVENOUS | Status: DC | PRN
  Administered 2019-07-26: 100 ug/kg/min via INTRAVENOUS

## 2019-07-26 MED ORDER — BUPIVACAINE-EPINEPHRINE (PF) 0.5% -1:200000 IJ SOLN
INTRAMUSCULAR | Status: DC | PRN
Start: 2019-07-26 — End: 2019-07-26
  Administered 2019-07-26: 20 mL via PERINEURAL

## 2019-07-26 MED ORDER — HYDROCORTISONE 1 % EX OINT
TOPICAL_OINTMENT | CUTANEOUS | Status: DC | PRN
  Filled 2019-07-26: qty 28

## 2019-07-26 MED ORDER — INSULIN GLARGINE 100 UNIT/ML ~~LOC~~ SOLN
30.0000 [IU] | Freq: Every day | SUBCUTANEOUS | Status: DC
  Administered 2019-07-26 – 2019-07-29 (×4): 30 [IU] via SUBCUTANEOUS
  Filled 2019-07-26 (×6): qty 0.3

## 2019-07-26 MED ORDER — ACETAMINOPHEN 325 MG PO TABS: 325.0000 mg | ORAL_TABLET | ORAL | Status: DC | PRN

## 2019-07-26 MED ORDER — MIDAZOLAM HCL 2 MG/2ML IJ SOLN
INTRAMUSCULAR | Status: AC
  Filled 2019-07-26: qty 2

## 2019-07-26 MED ORDER — FENTANYL CITRATE (PF) 100 MCG/2ML IJ SOLN
INTRAMUSCULAR | Status: DC | PRN
  Administered 2019-07-26 (×2): 50 ug via INTRAVENOUS

## 2019-07-26 MED ORDER — 0.9 % SODIUM CHLORIDE (POUR BTL) OPTIME
TOPICAL | Status: DC | PRN
  Administered 2019-07-26: 1000 mL

## 2019-07-26 MED ORDER — MEPERIDINE HCL 25 MG/ML IJ SOLN: 6.2500 mg | INTRAMUSCULAR | Status: DC | PRN

## 2019-07-26 MED ORDER — ONDANSETRON HCL 4 MG/2ML IJ SOLN
INTRAMUSCULAR | Status: DC | PRN
  Administered 2019-07-26: 4 mg via INTRAVENOUS

## 2019-07-26 MED ORDER — FENTANYL CITRATE (PF) 100 MCG/2ML IJ SOLN: 100.0000 ug | Freq: Once | INTRAMUSCULAR | Status: AC

## 2019-07-26 MED ORDER — ONDANSETRON HCL 4 MG/2ML IJ SOLN: 4.0000 mg | Freq: Once | INTRAMUSCULAR | Status: DC | PRN

## 2019-07-26 MED ORDER — OXYCODONE HCL 5 MG/5ML PO SOLN: 5.0000 mg | Freq: Once | ORAL | Status: DC | PRN

## 2019-07-26 MED ORDER — DEXAMETHASONE SODIUM PHOSPHATE 10 MG/ML IJ SOLN
INTRAMUSCULAR | Status: AC
  Filled 2019-07-26: qty 1

## 2019-07-26 MED ORDER — PROPOFOL 10 MG/ML IV BOLUS
INTRAVENOUS | Status: AC
  Filled 2019-07-26: qty 20

## 2019-07-26 MED ORDER — SUCCINYLCHOLINE CHLORIDE 200 MG/10ML IV SOSY
PREFILLED_SYRINGE | INTRAVENOUS | Status: AC
  Filled 2019-07-26: qty 10

## 2019-07-26 MED ORDER — FENTANYL CITRATE (PF) 250 MCG/5ML IJ SOLN
INTRAMUSCULAR | Status: AC
  Filled 2019-07-26: qty 5

## 2019-07-26 MED ORDER — LACTATED RINGERS IV SOLN: INTRAVENOUS | Status: DC

## 2019-07-26 MED ORDER — LIDOCAINE 2% (20 MG/ML) 5 ML SYRINGE
INTRAMUSCULAR | Status: AC
  Filled 2019-07-26: qty 5

## 2019-07-26 MED ORDER — DEXTROSE 50 % IV SOLN
25.0000 mL | Freq: Once | INTRAVENOUS | Status: AC
  Administered 2019-07-26: 25 mL via INTRAVENOUS

## 2019-07-26 MED ORDER — FENTANYL CITRATE (PF) 100 MCG/2ML IJ SOLN
INTRAMUSCULAR | Status: AC
  Filled 2019-07-26: qty 2

## 2019-07-26 MED ORDER — ONDANSETRON HCL 4 MG/2ML IJ SOLN
INTRAMUSCULAR | Status: AC
  Filled 2019-07-26: qty 2

## 2019-07-26 SURGICAL SUPPLY — 37 items
BLADE SAGITTAL 13X1.27X60 (BLADE) ×1 IMPLANT
BLADE SURG 21 STRL SS (BLADE) ×2 IMPLANT
BNDG COHESIVE 4X5 TAN STRL (GAUZE/BANDAGES/DRESSINGS) ×1 IMPLANT
BNDG COHESIVE 6X5 TAN STRL LF (GAUZE/BANDAGES/DRESSINGS) ×1 IMPLANT
BNDG GAUZE ELAST 4 BULKY (GAUZE/BANDAGES/DRESSINGS) ×4 IMPLANT
CNTNR URN SCR LID CUP LEK RST (MISCELLANEOUS) IMPLANT
CONT SPEC 4OZ STRL OR WHT (MISCELLANEOUS) ×2
COVER SURGICAL LIGHT HANDLE (MISCELLANEOUS) ×4 IMPLANT
COVER WAND RF STERILE (DRAPES) ×2 IMPLANT
DRAPE U-SHAPE 47X51 STRL (DRAPES) ×2 IMPLANT
DRSG ADAPTIC 3X8 NADH LF (GAUZE/BANDAGES/DRESSINGS) ×2 IMPLANT
DURAPREP 26ML APPLICATOR (WOUND CARE) ×2 IMPLANT
ELECT REM PT RETURN 9FT ADLT (ELECTROSURGICAL)
ELECTRODE REM PT RTRN 9FT ADLT (ELECTROSURGICAL) IMPLANT
GAUZE SPONGE 4X4 12PLY STRL (GAUZE/BANDAGES/DRESSINGS) ×2 IMPLANT
GLOVE BIOGEL PI IND STRL 9 (GLOVE) ×1 IMPLANT
GLOVE BIOGEL PI INDICATOR 9 (GLOVE) ×1
GLOVE SURG ORTHO 9.0 STRL STRW (GLOVE) ×2 IMPLANT
GOWN STRL REUS W/ TWL XL LVL3 (GOWN DISPOSABLE) ×2 IMPLANT
GOWN STRL REUS W/TWL XL LVL3 (GOWN DISPOSABLE) ×4
HANDPIECE INTERPULSE COAX TIP (DISPOSABLE)
KIT BASIN OR (CUSTOM PROCEDURE TRAY) ×2 IMPLANT
KIT DRSG PREVENA PLUS 7DAY 125 (MISCELLANEOUS) ×1 IMPLANT
KIT PREVENA INCISION MGT 13 (CANNISTER) ×1 IMPLANT
KIT TURNOVER KIT B (KITS) ×2 IMPLANT
MANIFOLD NEPTUNE II (INSTRUMENTS) ×2 IMPLANT
NS IRRIG 1000ML POUR BTL (IV SOLUTION) ×2 IMPLANT
PACK ORTHO EXTREMITY (CUSTOM PROCEDURE TRAY) ×2 IMPLANT
PAD ARMBOARD 7.5X6 YLW CONV (MISCELLANEOUS) ×4 IMPLANT
SET HNDPC FAN SPRY TIP SCT (DISPOSABLE) IMPLANT
STOCKINETTE IMPERVIOUS 9X36 MD (GAUZE/BANDAGES/DRESSINGS) IMPLANT
SUT ETHILON 2 0 PSLX (SUTURE) ×1 IMPLANT
SWAB COLLECTION DEVICE MRSA (MISCELLANEOUS) ×2 IMPLANT
SWAB CULTURE ESWAB REG 1ML (MISCELLANEOUS) IMPLANT
TOWEL GREEN STERILE (TOWEL DISPOSABLE) ×2 IMPLANT
TUBE CONNECTING 12X1/4 (SUCTIONS) ×2 IMPLANT
YANKAUER SUCT BULB TIP NO VENT (SUCTIONS) ×2 IMPLANT

## 2019-07-26 NOTE — Progress Notes (Signed)
Subjective:   Patient and patient's brother are present at the bedside. Patient states that she is waiting with a "nauseated stomach" for her procedure. She states it shouldn't be too bad since they are not taking her entire foot. She also states that the pain medication that they injected into her arm made her "Feel like I was drunk, and I don't ever want that medicine again." She is ready for the procedure to be over. She says that PT has been coming by and they have been working with her. She asks about her hospital timeline. We discussed transitioning from IV pain medication to PO medication, which may take several days after the procedure. Patient is not amendable to SNF placement due to not being able to ambulate on her affected extremity. She is a home person. All questions and concerns were addressed.   Objective: CBC Latest Ref Rng & Units 07/26/2019 07/25/2019 07/24/2019  WBC 4.0 - 10.5 K/uL 7.1 7.6 6.8  Hemoglobin 12.0 - 15.0 g/dL 12.0 12.1 11.2(L)  Hematocrit 36.0 - 46.0 % 37.7 37.2 35.4(L)  Platelets 150 - 400 K/uL 283 344 320   BMP Latest Ref Rng & Units 07/26/2019 07/25/2019 07/24/2019  Glucose 70 - 99 mg/dL 96 107(H) 71  BUN 8 - 23 mg/dL 21 16 16   Creatinine 0.44 - 1.00 mg/dL 0.91 1.11(H) 0.95  BUN/Creat Ratio 6 - 22 (calc) - - -  Sodium 135 - 145 mmol/L 141 139 142  Potassium 3.5 - 5.1 mmol/L 4.1 3.7 3.6  Chloride 98 - 111 mmol/L 104 105 106  CO2 22 - 32 mmol/L 22 26 27   Calcium 8.9 - 10.3 mg/dL 9.1 9.5 9.1   Vital signs in last 24 hours: Vitals:   07/25/19 0454 07/25/19 1333 07/25/19 2143 07/26/19 0459  BP: 126/68 122/64 (!) 147/67 (!) 99/49  Pulse: 71 68 70 70  Resp: 17 19 18 18   Temp: 98.9 F (37.2 C) 97.9 F (36.6 C) 98 F (36.7 C) 98.4 F (36.9 C)  TempSrc: Oral Oral Oral Oral  SpO2: 99% 97% 98% 95%  Weight:      Height:       Physical Exam General: Sitting up in the bedside chair, NAD HEENT: NCAT CV: RRR, normal S1 and S2 no murmurs or gallops  appreciated PULM: Clear to auscultation bilaterally ABD: Soft and nontender NEURO: Nonfocal  Assessment/Plan:  Active Problems:   Controlled type 2 diabetes mellitus with complication, with long-term current use of insulin (HCC)   Acute osteomyelitis of left calcaneus (HCC)   Osteomyelitis (HCC)  In summary, Debra [name] is a 74 year old female with a past medical history significant for left calcaneus osteomyelitis, [id number] complicated by diabetic foot ulcers, PVD, HTN, HLD, and morbid obesity who presented with worsening left foot pain in the setting of known osteomyelitis that has been unresponsive to p.o. antibiotics in the outpatient setting.  #Osteomyelitis L foot: Multidrug-resistant E. coli growing out of the patient's foot wound on 4/22. There was a plan to start ertapenem IV in the outpatient setting with ID this Thursday, however the patient had not been started on it yet because she needed central line placement. Her pain became so unbearable despite use of tramadol, so the patient was advised to come to the ED. Patient's vital signs continue to be stable, and has no signs of sepsis at this time. MRI shows worsening osteomyelitis in the calcaneus and possibly medial talus.Orthopedics and infectious disease recommend BKA, but the patient is vehemently against this despite  explanations of the risks and benefits.  -Consult Ortho, appreciate recommendations  -Dr. Sharol Given to attempt foot salvage intervention with partial calcaneal excision in addition to 6 weeks of IV antibiotics today. Pt will likely need SNF placement but she prefers HH -Consult ID, appreciate recommendations   -Holding off on abx for now to obtain surgical culture results -Dilaudid 0.5-1 mg every 2 hours as needed -PT/OT   #T2DM: Patient takes 70/30 insulin 100 units in the morning and 82 units at night in addition pioglitazone 40 mg daily at home. Hgb A1c 8.6 on admission. -Lantus 45 units BID -Novolog 10 uints TID w/  meals -SSI  #HTN #HLD -Metoprolol 50 mg daily -Atorvastatin 40 mg daily  #FEN/GI -Diet: NPO -Fluids: None -Protonix 40 mg daily -Zofran 4 mg q6hrs PRN  #DVT prophylaxis -Lovenox per pharmacy  #CODE STATUS: DNR  #Dispo: Suspected d/c later this week or early next week Prior to Admission Living Arrangement: Home Anticipated Discharge Location:  Home w/ HH Barriers to [place] workup  Debra Plater, MD Internal Medicine, PGY1 Pager: [phone]  07/26/2019,6:48 AM

## 2019-07-26 NOTE — Anesthesia Postprocedure Evaluation (Signed)
Anesthesia Post Note  Patient: Debra [name]  Procedure(s) Performed: LEFT PARTIAL CALCANEOUS EXCISION (Left Foot)     Patient location during evaluation: PACU Anesthesia Type: General Level of consciousness: awake and alert Pain management: pain level controlled Vital Signs Assessment: post-procedure vital signs reviewed and stable Respiratory status: spontaneous breathing, nonlabored ventilation, respiratory function stable and patient connected to nasal cannula oxygen Cardiovascular status: blood pressure returned to baseline and stable Postop Assessment: no apparent nausea or vomiting Anesthetic complications: no    Last Vitals:  Vitals:   07/26/19 1330 07/26/19 1335  BP:  139/62  Pulse: 67 65  Resp: (!) 23 (!) 21  Temp: 36.6 C   SpO2: 100% 100%    Last Pain:  Vitals:   07/26/19 1300  TempSrc:   PainSc: 10-Worst pain ever                 Bryen Hinderman

## 2019-07-26 NOTE — Interval H&P Note (Signed)
History and Physical Interval Note:  07/26/2019 6:45 AM  Debra [name]  has presented today for surgery, with the diagnosis of Osteomyelitis Left Calcaneous.  The various methods of treatment have been discussed with the patient and family. After consideration of risks, benefits and other options for treatment, the patient has consented to  Procedure(s): LEFT PARTIAL CALCANEOUS EXCISION (Left) as a surgical intervention.  The patient's history has been reviewed, patient examined, no change in status, stable for surgery.  I have reviewed the patient's chart and labs.  Questions were answered to the patient's satisfaction.     [name]

## 2019-07-26 NOTE — Op Note (Signed)
07/26/2019  12:41 PM  PATIENT:  Debra Gaines    PRE-OPERATIVE DIAGNOSIS:  Osteomyelitis Left Calcaneous  POST-OPERATIVE DIAGNOSIS:  Same  PROCEDURE:  LEFT PARTIAL CALCANEOUS EXCISION Local tissue rearrangement for wound closure 9 x 5 cm. Tissue sent for cultures. Application of Prevena wound VAC 13 cm.   SURGEON:  [name], MD  PHYSICIAN ASSISTANT:None ANESTHESIA:   General  PREOPERATIVE INDICATIONS:  [name] is a  74 y.o. female with a diagnosis of Osteomyelitis Left Calcaneous who failed conservative measures and elected for surgical management.    The risks benefits and alternatives were discussed with the patient preoperatively including but not limited to the risks of infection, bleeding, nerve injury, cardiopulmonary complications, the need for revision surgery, among others, and the patient was willing to proceed.  OPERATIVE IMPLANTS: Praveena wound VAC  _0 @  OPERATIVE FINDINGS: Abscess with soft infected calcaneus both soft tissue and bone sent for cultures  OPERATIVE PROCEDURE: Patient was brought the operating room after undergoing a popliteal block she then underwent a MAC anesthetic.  After adequate levels anesthesia were obtained patient's left lower extremity was prepped using DuraPrep draped into a sterile field a timeout was called.  Elliptical incision was made around the ulcerative tissue and this was carried down to bone.  An oscillating saw was used to resect the posterior third of the calcaneus.  The bone was soft there was an abscess tissue superficial to the calcaneus.  The calcaneus bone and soft tissue and abscess were sent for cultures.  Patient received clindamycin after she had the tissue sent for cultures.  Electrocautery was used for hemostasis the wound was irrigated with normal saline.  Local tissue rearrangement was used to close the wound 9 x 5 cm.  A Prevena wound VAC was applied this was covered with a Coban compression wrap.   Patient was taken the PACU in stable condition.   DISCHARGE PLANNING:  Antibiotic duration: Anticipate patient will need 6 weeks of IV antibiotics through a PICC line  Weightbearing: Nonweightbearing on the left  Pain medication: Opioid pathway  Dressing care/ Wound VAC: Continue wound VAC for 1 week after discharge  Ambulatory devices: Walker  Discharge to: Anticipate patient may need to consider skilled nursing care for both her IV antibiotics and therapy   Follow-up: In the office 1 week post operative.

## 2019-07-26 NOTE — Progress Notes (Signed)
Hypoglycemic Event  CBG: 53 at 2055  Treatment: 8 oz juice/soda and D50 25 mL (12.5 gm)  Symptoms: Shaky  Follow-up CBG: Time:2125,2210 CBG Result:66,105  Possible Reasons for Event: Inadequate oral intake  Comments/MD notified:Ellen Ronnald Ramp MD    Colin Broach

## 2019-07-26 NOTE — Transfer of Care (Signed)
Immediate Anesthesia Transfer of Care Note  Patient: Debra [name]  Procedure(s) Performed: LEFT PARTIAL CALCANEOUS EXCISION (Left Foot)  Patient Location: PACU  Anesthesia Type:MAC combined with regional for post-op pain  Level of Consciousness: awake and alert   Airway & Oxygen Therapy: Patient Spontanous Breathing and Patient connected to nasal cannula oxygen  Post-op Assessment: Report given to RN and Post -op Vital signs reviewed and stable  Post vital signs: Reviewed and stable  Last Vitals:  Vitals Value Taken Time  BP    Temp 36.5 C 07/26/19 1233  Pulse 109 07/26/19 1233  Resp    SpO2      Last Pain:  Vitals:   07/26/19 0817  TempSrc:   PainSc: 0-No pain      Patients Stated Pain Goal: 0 (28/63/81 [id number])  Complications: No apparent anesthesia complications

## 2019-07-26 NOTE — Anesthesia Preprocedure Evaluation (Addendum)
Anesthesia Evaluation  Patient identified by MRN, date of birth, ID band Patient awake    Reviewed: Allergy & Precautions, NPO status , Patient's Chart, lab work & pertinent test results  Airway Mallampati: I  TM Distance: >3 FB Neck ROM: Full    Dental  (+) Missing, Poor Dentition,    Pulmonary neg pulmonary ROS,    Pulmonary exam normal breath sounds clear to auscultation       Cardiovascular hypertension, Pt. on medications + Peripheral Vascular Disease  Normal cardiovascular exam Rhythm:Regular Rate:Normal  Chronic venous insufficiency   Neuro/Psych negative neurological ROS  negative psych ROS   GI/Hepatic Neg liver ROS, GERD  Controlled and Medicated,H/o SBO    Endo/Other  diabetes, Insulin Dependent, Oral Hypoglycemic AgentsMorbid obesity  Renal/GU negative Renal ROS     Musculoskeletal negative musculoskeletal ROS (+)   Abdominal (+) + obese,   Peds  Hematology HLD   Anesthesia Other Findings ventral hernia  Reproductive/Obstetrics                            Anesthesia Physical  Anesthesia Plan  ASA: III  Anesthesia Plan: Regional and MAC   Post-op Pain Management: GA combined w/ Regional for post-op pain   Induction: Intravenous  PONV Risk Score and Plan: 3 and Dexamethasone, Ondansetron, Treatment may vary due to age or medical condition and Midazolam  Airway Management Planned: Oral ETT and LMA  Additional Equipment:   Intra-op Plan:   Post-operative Plan: Extubation in OR  Informed Consent: I have reviewed the patients History and Physical, chart, labs and discussed the procedure including the risks, benefits and alternatives for the proposed anesthesia with the patient or authorized representative who has indicated his/her understanding and acceptance.     Dental advisory given  Plan Discussed with: CRNA, Anesthesiologist and Surgeon  Anesthesia Plan  Comments: ( )       Anesthesia Quick Evaluation

## 2019-07-26 NOTE — Anesthesia Procedure Notes (Signed)
Anesthesia Regional Block: Popliteal block   Pre-Anesthetic Checklist: ,, timeout performed, Correct Patient, Correct Site, Correct Laterality, Correct Procedure, Correct Position, site marked, Risks and benefits discussed,  Surgical consent,  Pre-op evaluation,  At surgeon's request and post-op pain management  Laterality: Left  Prep: chloraprep       Needles:  Injection technique: Single-shot  Needle Type: Echogenic Stimulator Needle     Needle Length: 5cm  Needle Gauge: 22     Additional Needles:   Procedures:, nerve stimulator,,, ultrasound used (permanent image in chart),,,,   Nerve Stimulator or Paresthesia:  Response: foot, 0.45 mA,   Additional Responses:   Narrative:  Start time: 07/26/2019 11:30 AM End time: 07/26/2019 11:35 AM Injection made incrementally with aspirations every 5 mL.  Performed by: Personally  Anesthesiologist: Janeece Riggers, MD  Additional Notes: Functioning IV was confirmed and monitors were applied.  A 69mm 22ga Arrow echogenic stimulator needle was used. Sterile prep and drape,hand hygiene and sterile gloves were used. Ultrasound guidance: relevant anatomy identified, needle position confirmed, local anesthetic spread visualized around nerve(s)., vascular puncture avoided.  Image printed for medical record. Negative aspiration and negative test dose prior to incremental administration of local anesthetic. The patient tolerated the procedure well.

## 2019-07-27 ENCOUNTER — Inpatient Hospital Stay: Payer: Self-pay

## 2019-07-27 DIAGNOSIS — E1169 Type 2 diabetes mellitus with other specified complication: Principal | ICD-10-CM

## 2019-07-27 DIAGNOSIS — E785 Hyperlipidemia, unspecified: Secondary | ICD-10-CM

## 2019-07-27 DIAGNOSIS — I10 Essential (primary) hypertension: Secondary | ICD-10-CM

## 2019-07-27 DIAGNOSIS — Z794 Long term (current) use of insulin: Secondary | ICD-10-CM

## 2019-07-27 DIAGNOSIS — L02612 Cutaneous abscess of left foot: Secondary | ICD-10-CM

## 2019-07-27 LAB — BASIC METABOLIC PANEL
Anion gap: 6 (ref 5–15)
BUN: 16 mg/dL (ref 8–23)
CO2: 28 mmol/L (ref 22–32)
Calcium: 8.4 mg/dL — ABNORMAL LOW (ref 8.9–10.3)
Chloride: 104 mmol/L (ref 98–111)
Creatinine, Ser: 0.93 mg/dL (ref 0.44–1.00)
GFR calc Af Amer: >60 mL/min (ref >60)
GFR calc non Af Amer: >60 mL/min (ref >60)
Glucose, Bld: 100 mg/dL — ABNORMAL HIGH (ref 70–99)
Potassium: 3.6 mmol/L (ref 3.5–5.1)
Sodium: 138 mmol/L (ref 135–145)

## 2019-07-27 LAB — CBC
HCT: 34.6 % — ABNORMAL LOW (ref 36.0–46.0)
Hemoglobin: 10.9 g/dL — ABNORMAL LOW (ref 12.0–15.0)
MCH: 29.5 pg (ref 26.0–34.0)
MCHC: 31.5 g/dL (ref 30.0–36.0)
MCV: 93.5 fL (ref 80.0–100.0)
Platelets: 335 10*3/uL (ref 150–400)
RBC: 3.7 MIL/uL — ABNORMAL LOW (ref 3.87–5.11)
RDW: 13.3 % (ref 11.5–15.5)
WBC: 8.9 10*3/uL (ref 4.0–10.5)
nRBC: 0 % (ref 0.0–0.2)

## 2019-07-27 LAB — GLUCOSE, CAPILLARY
Glucose-Capillary: 92 mg/dL (ref 70–99)
Glucose-Capillary: 118 mg/dL — ABNORMAL HIGH (ref 70–99)
Glucose-Capillary: 115 mg/dL — ABNORMAL HIGH (ref 70–99)
Glucose-Capillary: 117 mg/dL — ABNORMAL HIGH (ref 70–99)

## 2019-07-27 LAB — CULTURE, BLOOD (ROUTINE X 2)
Culture: NO GROWTH
Culture: NO GROWTH

## 2019-07-27 MED ORDER — SODIUM CHLORIDE 0.9% FLUSH
10.0000 mL | INTRAVENOUS | Status: DC | PRN
  Administered 2019-07-30: 10 mL

## 2019-07-27 MED ORDER — SODIUM CHLORIDE 0.9 % IV SOLN
1.0000 g | INTRAVENOUS | Status: DC
  Administered 2019-07-27 – 2019-07-31 (×5): 1000 mg via INTRAVENOUS
  Filled 2019-07-27 (×5): qty 1

## 2019-07-27 MED ORDER — VANCOMYCIN HCL 2000 MG/400ML IV SOLN
2000.0000 mg | Freq: Once | INTRAVENOUS | Status: AC
  Administered 2019-07-27: 13:00:00 2000 mg via INTRAVENOUS
  Filled 2019-07-27: qty 400

## 2019-07-27 MED ORDER — VANCOMYCIN HCL 1250 MG/250ML IV SOLN: 1250.0000 mg | INTRAVENOUS | Status: DC

## 2019-07-27 MED ORDER — SODIUM CHLORIDE 0.9% FLUSH
10.0000 mL | Freq: Two times a day (BID) | INTRAVENOUS | Status: DC
  Administered 2019-07-28 – 2019-07-30 (×5): 10 mL

## 2019-07-27 MED ORDER — TRAMADOL HCL 50 MG PO TABS
50.0000 mg | ORAL_TABLET | Freq: Four times a day (QID) | ORAL | Status: DC | PRN
  Administered 2019-07-27 – 2019-07-31 (×7): 50 mg via ORAL
  Filled 2019-07-27 (×7): qty 1

## 2019-07-27 MED ORDER — CHLORHEXIDINE GLUCONATE CLOTH 2 % EX PADS
6.0000 | MEDICATED_PAD | Freq: Every day | CUTANEOUS | Status: DC
  Administered 2019-07-28 – 2019-07-29 (×2): 6 via TOPICAL

## 2019-07-27 MED ORDER — PROMETHAZINE HCL 25 MG/ML IJ SOLN: 12.5000 mg | Freq: Once | INTRAMUSCULAR | Status: DC | PRN

## 2019-07-27 NOTE — Progress Notes (Signed)
Peripherally Inserted Central Catheter Placement  The IV Nurse has discussed with the patient and/or persons authorized to consent for the patient, the purpose of this procedure and the potential benefits and risks involved with this procedure.  The benefits include less needle sticks, lab draws from the catheter, and the patient may be discharged home with the catheter. Risks include, but not limited to, infection, bleeding, blood clot (thrombus formation), and puncture of an artery; nerve damage and irregular heartbeat and possibility to perform a PICC exchange if needed/ordered by physician.  Alternatives to this procedure were also discussed.  Bard Power PICC patient education guide, fact sheet on infection prevention and patient information card has been provided to patient /or left at bedside.    PICC Placement Documentation  PICC Single Lumen 07/27/19 PICC Right Cephalic 39 cm 0 cm (Active)  Indication for Insertion or Continuance of Line Home intravenous therapies (PICC only) 07/27/19 2109  Exposed Catheter (cm) 0 cm 07/27/19 2109  Site Assessment Clean;Intact;Dry 07/27/19 2109  Line Status Flushed;Saline locked;Blood return noted 07/27/19 2109  Dressing Type Transparent 07/27/19 2109  Dressing Status Clean;Dry;Intact;Antimicrobial disc in place 07/27/19 2109  Dressing Intervention New dressing 07/27/19 2109  Dressing Change Due 08/03/19 07/27/19 2109       Gordan Payment 07/27/2019, 9:10 PM

## 2019-07-27 NOTE — NC FL2 (Signed)
[place] LEVEL OF CARE SCREENING TOOL     IDENTIFICATION  Patient Name: Debra [name] Birthdate: [birth year] Sex: female Admission Date (Current Location): 07/22/2019  [facility] and [place] Number:  Herbalist and Address:  The . [facility], [place] [address] [place], [place] [address]      Provider Number: [id number]  Attending Physician Name and Address:  Velna Ochs, MD  Relative Name and Phone Number:       Current Level of Care: Hospital Recommended Level of Care: [place] Prior Approval Number:    Date Approved/Denied:   PASRR Number: [id number]  Discharge Plan: SNF    Current Diagnoses: Patient Active Problem List   Diagnosis Date Noted  . Cutaneous abscess of left foot   . Osteomyelitis ([place]) 07/22/2019  . Diabetic foot infection ([place]) 07/10/2019  . Acute osteomyelitis of left calcaneus ([place]) 07/10/2019  . Group B streptococcal infection 07/10/2019  . 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 Pseudogout of right knee 08/02/2017  . SBO (small bowel obstruction) ([place]) 07/22/2017  . Abnormal LFTs 03/20/2016  . Abdominal pain, left lower quadrant 07/04/2015  . Type 2 diabetes mellitus with diabetic polyneuropathy ([place]) 10/06/2013  . Hyperlipidemia associated with type 2 diabetes mellitus ([place]) 10/06/2013  . Fungal dermatitis 08/11/2013  . Hyperlipidemia 06/09/2013  . Choledocholithiasis with acute cholecystitis 04/14/2013  . Symptomatic cholelithiasis 04/13/2013  . Neuropathic pain of both legs 04/04/2013  . Severe obesity (BMI >= 40) ([place]) 11/28/2012  . Obesity (BMI 30-39.9) 11/28/2012  . Diabetes mellitus with neurological manifestation ([place])   . Hypertension goal BP (blood pressure) < 130/80   . Osteopenia   . Chronic venous insufficiency   . Leg edema   . Hypertensive retinopathy, grade 2 03/30/2012  . Medial epicondylitis 02/11/2012  . Superficial thrombophlebitis of  left leg 07/15/2011  . GERD (gastroesophageal reflux disease) 02/25/2011  . Right knee pain 02/16/2011  . Preventive measure 09/11/2010  . Neck pain 07/03/2010  . Grieving 04/17/2010  . OSTEOPENIA 04/12/2006  . Controlled type 2 diabetes mellitus with complication, with long-term current use of insulin ([place]) 03/08/2006  . MORBID OBESITY 03/08/2006  . Essential hypertension 03/08/2006    Orientation RESPIRATION BLADDER Height & Weight     Self, Time, Place, Situation  Normal Incontinent Weight: 230 lb (104.3 kg) Height:  5' 4.02" (162.6 cm)  BEHAVIORAL SYMPTOMS/MOOD NEUROLOGICAL BOWEL NUTRITION STATUS      Continent Diet(see discharge summary)  AMBULATORY STATUS COMMUNICATION OF NEEDS Skin   Extensive Assist Verbally Surgical wounds, Wound Vac, Other (Comment)(ulcers on L leg, wound vac (prevena) on L foot)                       Personal Care Assistance Level of Assistance  Bathing, Feeding, Dressing Bathing Assistance: Maximum assistance Feeding assistance: Independent Dressing Assistance: Maximum assistance     Functional Limitations Info  Sight, Hearing, Speech Sight Info: Adequate Hearing Info: Adequate Speech Info: Adequate    SPECIAL CARE FACTORS FREQUENCY  OT (By licensed OT), PT (By licensed PT)     PT Frequency: 5x week OT Frequency: 5x week            Contractures Contractures Info: Not present    Additional Factors Info  Code Status, Allergies, Insulin Sliding Scale Code Status Info: DNR Allergies Info: Banana, Hydrocodone, Neomycin-polymyxin-gramicidin, Penicillins, Aspirin, Latex, Metformin And Related, Voltaren (Diclofenac Sodium)   Insulin Sliding Scale Info: insulin aspart (  novoLOG) injection 0-5 Units daily at bedtime; insulin aspart (novoLOG) injection 0-20 Units 3x daily with meals; insulin glargine (LANTUS) injection 30 Units daily at bedtime       Current Medications (07/27/2019):  This is the current hospital active medication  list Current Facility-Administered Medications  Medication Dose Route Frequency Provider Last Rate Last Admin  . acetaminophen (TYLENOL) tablet 650 mg  650 mg Oral Q6H PRN Harvie Heck, MD   650 mg at 07/27/19 0534  . atorvastatin (LIPITOR) tablet 40 mg  40 mg Oral QHS [place], Carley D, DO   40 mg at 07/26/19 2124  . ertapenem (INVANZ) 1,000 mg in sodium chloride 0.9 % 100 mL IVPB  1 g Intravenous Q24H [place], FNP      . hydrocortisone 1 % ointment   Topical PRN Velna Ochs, MD   Given at 07/26/19 2355  . insulin aspart (novoLOG) injection 0-20 Units  0-20 Units Subcutaneous TID WC [place], Carley D, DO   4 Units at 07/25/19 1748  . insulin aspart (novoLOG) injection 0-5 Units  0-5 Units Subcutaneous QHS [place], Carley D, DO      . insulin glargine (LANTUS) injection 30 Units  30 Units Subcutaneous QHS Ladona Horns, MD   30 Units at 07/26/19 2356  . metoprolol succinate (TOPROL-XL) 24 hr tablet 50 mg  50 mg Oral Daily [place], Carley D, DO   50 mg at 07/27/19 [id number]  . ondansetron (ZOFRAN) tablet 4 mg  4 mg Oral Q6H PRN [place], Carley D, DO   4 mg at 07/23/19 1813   Or  . ondansetron (ZOFRAN) injection 4 mg  4 mg Intravenous Q6H PRN [place], Carley D, DO   4 mg at 07/27/19 [id number]  . pantoprazole (PROTONIX) EC tablet 40 mg  40 mg Oral Daily [place], Carley D, DO   40 mg at 07/27/19 [id number]  . traMADol (ULTRAM) tablet 50 mg  50 mg Oral Q6H PRN [name], MD      . Derrill Memo ON 07/28/2019] vancomycin (VANCOREADY) IVPB 1250 mg/250 mL  1,250 mg Intravenous Q24H Gillian Scarce, RPH      . vancomycin (VANCOREADY) IVPB 2000 mg/400 mL  2,000 mg Intravenous Once Gillian Scarce, RPH 200 mL/hr at 07/27/19 1246 2,000 mg at 07/27/19 1246     Discharge Medications: Please see discharge summary for a list of discharge medications.  Relevant Imaging Results:  Relevant Lab Results:   Additional Information SS# [place], [place]

## 2019-07-27 NOTE — Progress Notes (Signed)
Pharmacy Antibiotic Note  Debra [name] is a 74 y.o. female admitted on 07/22/2019 with Osteomyelitis. Patient underwent partial calcaneous excision on 5/5 with Dr. Sharol Given. Today, patient's WBC is 8.9 (wnl) and is afebrile. Given age and co-morbities, will use a slightly lower dose than the recommended dose per nomogram. Pharmacy has been consulted for vancomycin dosing.  Plan: Give vancomycin load of 2000 mg IV x1 dose  Vancomycin 1250 IV every 24 hours.  Goal trough 15-20 mcg/mL.   - Follow surgical cultures and blood cultures  - Monitor clinical improvement and renal function  - Obtain vancomycin trough as needed  Height: 5' 4.02" (162.6 cm) Weight: 104.3 kg (230 lb) IBW/kg (Calculated) : 54.74  Temp (24hrs), Avg:98.3 F (36.8 C), Min:97.7 F (36.5 C), Max:99.5 F (37.5 C)  Recent Labs  Lab 07/22/19 1014 07/22/19 1014 07/22/19 1707 07/23/19 0305 07/24/19 0214 07/25/19 0255 07/26/19 0312 07/27/19 0138  WBC 7.4   < >  --  7.2 6.8 7.6 7.1 8.9  CREATININE 1.08*   < >  --  0.99 0.95 1.11* 0.91 0.93  LATICACIDVEN 2.0*  --  1.1  --   --   --   --   --    < > = values in this interval not displayed.    Estimated Creatinine Clearance: 64.3 mL/min (by C-G formula based on SCr of 0.93 mg/dL).    Allergies  Allergen Reactions  . Banana Hives  . Hydrocodone Hives  . Neomycin-Polymyxin-Gramicidin Itching and Swelling    Eye drops caused swelling in face and rash and itching on arms and neck  . Penicillins Hives and Itching    She has tolerated keflex Has patient had a PCN reaction causing immediate rash, facial/tongue/throat swelling, SOB or lightheadedness with hypotension: Yes Has patient had a PCN reaction causing severe rash involving mucus membranes or skin necrosis: No Has patient had a PCN reaction that required hospitalization: NO Has patient had a PCN reaction occurring within the last 10 years:NO    . Aspirin Nausea Only    And causes bruises  . Latex Hives and Rash   . Metformin And Related Diarrhea  . Voltaren [Diclofenac Sodium] Nausea And Vomiting    Antimicrobials this admission: 5/6 Ertapenem >>  5/6 Vancomycin >>   Dose adjustments this admission:   Microbiology results: 5/6 surgical cx:  5/1 BCx: ngtd 5/1 MRSA PCR: negative   Thank you for allowing pharmacy to be a part of this patient's care.  Acey Lav, PharmD  PGY1 Acute Care Pharmacy Resident 07/27/2019 10:59 AM

## 2019-07-27 NOTE — Progress Notes (Signed)
Occupational Therapy Re-eval  Patient Details Name: Debra [name] MRN: [id number] DOB: 07-09-1945 Today's Date: 07/27/2019    History of present illness 74 y.o. female who complains of left heel pain. 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 She is s/p LEFT PARTIAL CALCANEOUS EXCISION on 07/26/19 with TDWB status in [facility].   OT comments  Patient seen post L partial calcaneous excision, now TDWB in [facility] boot.  Patient completing transfers with min assist +2 using RW, mod assist for LB ADLs. Discussed barriers for dc home, and patient agreeable to pursuing SNF at this point.  Voicing "If I go home, you and I know that I will put weight through it".  Educated on and completed chair pushups x 5 reps 3 sets for UB strengthening for increased ease with transfers with precautions. Will follow acutely.    Follow Up Recommendations  SNF;Supervision/Assistance - 24 hour    Equipment Recommendations  3 in 1 bedside commode    Recommendations for Other Services      Precautions / Restrictions Precautions Precautions: Fall Required Braces or Orthoses: Other Brace Other Brace: PRAFO Restrictions Weight Bearing Restrictions: Yes LLE Weight Bearing: Touchdown weight bearing Other Position/Activity Restrictions: In [facility]       Mobility Bed Mobility Overal bed mobility: Needs Assistance Bed Mobility: Supine to Sit     Supine to sit: Supervision;HOB elevated     General bed mobility comments: +rail, increased time, supervision for safety  Transfers Overall transfer level: Needs assistance Equipment used: Rolling walker (2 wheeled) Transfers: Sit to/from [facility] Sit to Stand: Min assist;+2 safety/equipment;+2 physical assistance;From elevated surface Stand pivot transfers: Min assist       General  transfer comment: Pt required cues for TDWB on L LE.   She had difficulty maintaining TDWB during sit to stand but did well once in standing and during pivot with cues.    Balance Overall balance assessment: Needs assistance Sitting-balance support: No upper extremity supported;Feet supported Sitting balance-Leahy Scale: Good     Standing balance support: Bilateral upper extremity supported Standing balance-Leahy Scale: Poor Standing balance comment: Required RW                           ADL either performed or assessed with clinical judgement   ADL Overall ADL's : Needs assistance/impaired             Lower Body Bathing: Minimal assistance;Sitting/lateral leans       Lower Body Dressing: Moderate assistance;Sitting/lateral leans Lower Body Dressing Details (indicate cue type and reason): able to don R sock, requires assist to manage clothing over hips  Toilet Transfer: Minimal assistance;Stand-pivot;RW;+2 for physical assistance;+2 for safety/equipment Toilet Transfer Details (indicate cue type and reason): simulated to recliner          Functional mobility during ADLs: Minimal assistance;+2 for physical assistance;+2 for safety/equipment;Rolling walker;Cueing for sequencing;Cueing for safety General ADL Comments: pt limited by TDWB L LE and adherence, impaired balance     Vision       Perception     Praxis      Cognition Arousal/Alertness: Awake/alert Behavior During Therapy: WFL for tasks assessed/performed Overall Cognitive Status: Within Functional Limits for tasks assessed  Exercises Exercises: Other exercises General Exercises - Lower Extremity Ankle Circles/Pumps: AROM;10 reps;Both;Seated Long Arc Quad: AROM;10 reps;Left;Seated Hip Flexion/Marching: AROM;Left;10 reps;Seated Other Exercises Other Exercises: chair pushups x 5 reps 3 sets with cueing for technique and mgmt of L LE     Shoulder Instructions       General Comments Patient reporting desire to go home at d/c.  Discussed TDWB precautions, safety, barriers, physical assist with transfers and throughout aDLs, and importance of maintaining precautions to allow healing.  Pt agreeable to SNF, but would like to talk to her brother first.  As she reports "If I go home, you and I know I"m going to put weight through it".     Pertinent Vitals/ Pain       Pain Assessment: 0-10 Pain Score: 4  Faces Pain Scale: Hurts little more Pain Location: L LE  Pain Descriptors / Indicators: Discomfort;Grimacing Pain Intervention(s): Limited activity within patient's tolerance;Monitored during session;Repositioned  Home Living                                          Prior Functioning/Environment              Frequency  Min 2X/week        Progress Toward Goals  OT Goals(current goals can now be found in the care plan section)     Acute Rehab OT Goals Patient Stated Goal: agreeable to consider SNF OT Goal Formulation: With patient  Plan Discharge plan remains appropriate;Frequency remains appropriate    Co-evaluation    PT/OT/SLP Co-Evaluation/Treatment: Yes Reason for Co-Treatment: Complexity of the patient's impairments (multi-system involvement);For patient/therapist safety PT goals addressed during session: Mobility/safety with mobility OT goals addressed during session: ADL's and self-care      AM-PAC OT "6 Clicks" Daily Activity     Outcome Measure   Help from another person eating meals?: None Help from another person taking care of personal grooming?: A Little Help from another person toileting, which includes using toliet, bedpan, or urinal?: A Little Help from another person bathing (including washing, rinsing, drying)?: A Little Help from another person to put on and taking off regular upper body clothing?: A Little Help from another person to put on and taking off  regular lower body clothing?: A Lot 6 Click Score: 18    End of Session Equipment Utilized During Treatment: Rolling walker;Gait belt  OT Visit Diagnosis: Other abnormalities of gait and mobility (R26.89);Muscle weakness (generalized) (M62.81);Pain Pain - Right/Left: Left Pain - part of body: Ankle and joints of foot   Activity Tolerance Patient tolerated treatment well   Patient Left in chair;with call bell/phone within reach   Nurse Communication Mobility status        Time: [id number] OT Time Calculation (min): 29 min  Charges: OT General Charges $OT Visit: 1 Visit OT Evaluation $OT Re-eval: 1 Re-eval  Jolaine Artist, OT Acute Rehabilitation Services Pager [phone] Office [phone]    Delight Stare 07/27/2019, 1:07 PM

## 2019-07-27 NOTE — Progress Notes (Signed)
[facility]  Date of Admission:  07/22/2019     Total days of antibiotics 1         ASSESSMENT:  Ms. Luckenbaugh is POD 1 from left partial calcaneous excision for osteomyelitis of the left calcaneous. Blood cultures finalized without growth to date. Surgical specimen with gram positive cocci and gram negative rods on gram stain and Group B strep on culture. Will continue current dose of ertapenem given history of Morganella and discontinue vancomycin. Continue to monitor cultures and narrow antibiotics as able. PICC line ordered. Will need 6 weeks of IV therapy from the date of surgery. Final recommendations pending cultures. Disposition to be determined by case management and primary team.   PLAN:  1. Continue ertapenem.  2. PICC line placement. 3. Discontinue vancomycin. 4. Wound care per Dr. Sharol Given 5. Final recommendations pending culture results.   Active Problems:   Controlled type 2 diabetes mellitus with complication, with long-term current use of insulin (HCC)   Acute osteomyelitis of left calcaneus (HCC)   Osteomyelitis (HCC)   Cutaneous abscess of left foot   . atorvastatin  40 mg Oral QHS  . insulin aspart  0-20 Units Subcutaneous TID WC  . insulin aspart  0-5 Units Subcutaneous QHS  . insulin glargine  30 Units Subcutaneous QHS  . metoprolol succinate  50 mg Oral Daily  . pantoprazole  40 mg Oral Daily    SUBJECTIVE:  Afebrile overnight with no acute events. Adamant about not going to a skilled nursing facility and wanting to go home because of the cost.   Allergies  Allergen Reactions  . Banana Hives  . Hydrocodone Hives  . Neomycin-Polymyxin-Gramicidin Itching and Swelling    Eye drops caused swelling in face and rash and itching on arms and neck  . Penicillins Hives and Itching    She has tolerated keflex Has patient had a PCN reaction causing immediate rash, facial/tongue/throat swelling, SOB or lightheadedness with hypotension:  Yes Has patient had a PCN reaction causing severe rash involving mucus membranes or skin necrosis: No Has patient had a PCN reaction that required hospitalization: NO Has patient had a PCN reaction occurring within the last 10 years:NO    . Aspirin Nausea Only    And causes bruises  . Latex Hives and Rash  . Metformin And Related Diarrhea  . Voltaren [Diclofenac Sodium] Nausea And Vomiting     Review of Systems: Review of Systems  Constitutional: Negative for chills, fever and weight loss.  Respiratory: Negative for cough, shortness of breath and wheezing.   Cardiovascular: Negative for chest pain and leg swelling.  Gastrointestinal: Negative for abdominal pain, constipation, diarrhea, nausea and vomiting.  Skin: Negative for rash.      OBJECTIVE: Vitals:   07/27/19 0142 07/27/19 0528 07/27/19 0822 07/27/19 1407  BP: 137/63 124/67 (!) 145/62 (!) 156/61  Pulse: 63 68 70 69  Resp: 17 20    Temp: 98.6 F (37 C) 99.5 F (37.5 C) 98.2 F (36.8 C) 98.7 F (37.1 C)  TempSrc: Oral Oral Oral Oral  SpO2: 96% 93% 96% 99%  Weight:      Height:       Body mass index is 39.46 kg/m.  Physical Exam Constitutional:      General: She is not in acute distress.    Appearance: She is well-developed.     Comments: Seated in bed  Cardiovascular:     Rate and Rhythm: Normal rate and regular rhythm.  Heart sounds: Normal heart sounds.  Pulmonary:     Effort: Pulmonary effort is normal.     Breath sounds: Normal breath sounds.  Skin:    General: Skin is warm and dry.  Neurological:     Mental Status: She is alert and oriented to person, place, and time.  Psychiatric:        Mood and Affect: Mood normal.     Lab Results Lab Results  Component Value Date   WBC 8.9 07/27/2019   HGB 10.9 (L) 07/27/2019   HCT 34.6 (L) 07/27/2019   MCV 93.5 07/27/2019   PLT 335 07/27/2019    Lab Results  Component Value Date   CREATININE 0.93 07/27/2019   BUN 16 07/27/2019   NA 138  07/27/2019   K 3.6 07/27/2019   CL 104 07/27/2019   CO2 28 07/27/2019    Lab Results  Component Value Date   ALT 13 07/22/2019   AST 18 07/22/2019   ALKPHOS 87 07/22/2019   BILITOT 0.7 07/22/2019     Microbiology: Recent Results (from the past 240 hour(s))  Blood culture (routine x 2)     Status: None   Collection Time: 07/22/19 11:50 AM   Specimen: BLOOD  Result Value Ref Range Status   Specimen Description BLOOD LEFT ANTECUBITAL  Final   Special Requests   Final    BOTTLES DRAWN AEROBIC AND ANAEROBIC Blood Culture results may not be optimal due to an inadequate volume of blood received in culture bottles   Culture   Final    NO GROWTH 5 DAYS Performed at [facility], [place] [address], [place], [place] [address]    Report Status 07/27/2019 FINAL  Final  Blood culture (routine x 2)     Status: None   Collection Time: 07/22/19 11:55 AM   Specimen: BLOOD LEFT HAND  Result Value Ref Range Status   Specimen Description BLOOD LEFT HAND  Final   Special Requests   Final    BOTTLES DRAWN AEROBIC AND ANAEROBIC Blood Culture results may not be optimal due to an inadequate volume of blood received in culture bottles   Culture   Final    NO GROWTH 5 DAYS Performed at [facility], [place] [address], [place], [place] [address]    Report Status 07/27/2019 FINAL  Final  SARS CORONAVIRUS 2 (TAT 6-24 HRS) Nasopharyngeal Nasopharyngeal Swab     Status: None   Collection Time: 07/22/19  4:12 PM   Specimen: Nasopharyngeal Swab  Result Value Ref Range Status   SARS Coronavirus 2 NEGATIVE NEGATIVE Final    Comment: (NOTE) SARS-CoV-2 target nucleic acids are NOT DETECTED. The SARS-CoV-2 RNA is generally detectable in upper and lower respiratory specimens during the acute phase of infection. Negative results do not preclude SARS-CoV-2 infection, do not rule out co-infections with other pathogens, and should not be used as the sole basis for treatment or other patient management  decisions. Negative results must be combined with clinical observations, patient history, and epidemiological information. The expected result is Negative. Fact Sheet for Patients: SugarRoll.be Fact Sheet for Healthcare Providers: https://www.woods-mathews.com/ This test is not yet approved or cleared by the [place] FDA and  has been authorized for detection and/or diagnosis of SARS-CoV-2 by FDA under an Emergency Use Authorization (EUA). This EUA will remain  in effect (meaning this test can be used) for the duration of the COVID-19 declaration under Section 56 4(b)(1) of the Act, 21 U.S.C. section 360bbb-3(b)(1), unless  the authorization is terminated or revoked sooner. Performed at [facility], [place] [address], [place], [place] [address]   Surgical pcr screen     Status: None   Collection Time: 07/25/19  5:21 PM   Specimen: Nasal Mucosa; Nasal Swab  Result Value Ref Range Status   MRSA, PCR NEGATIVE NEGATIVE Final   Staphylococcus aureus NEGATIVE NEGATIVE Final    Comment: (NOTE) The Xpert SA Assay (FDA approved for NASAL specimens in patients 87 years of age and older), is one component of a comprehensive surveillance program. It is not intended to diagnose infection nor to guide or monitor treatment. Performed at [facility], [place] [address], [place], [place] [address]   Aerobic/Anaerobic Culture (surgical/deep wound)     Status: None (Preliminary result)   Collection Time: 07/26/19 12:11 PM   Specimen: PATH Other; Tissue  Result Value Ref Range Status   Specimen Description TISSUE  Final   Special Requests LEFT FOOT CALCANEOUS BONE AND TISSUE SPEC A  Final   Gram Stain   Final    ABUNDANT WBC PRESENT,BOTH PMN AND MONONUCLEAR ABUNDANT GRAM POSITIVE COCCI FEW GRAM NEGATIVE RODS    Culture   Final    ABUNDANT GROUP B STREP(S.AGALACTIAE)ISOLATED TESTING AGAINST S. AGALACTIAE NOT ROUTINELY PERFORMED DUE TO  PREDICTABILITY OF AMP/PEN/VAN SUSCEPTIBILITY. CULTURE REINCUBATED FOR BETTER GROWTH Performed at [facility], [place] [address], [place], [place] [address]    Report Status PENDING  Incomplete     Terri Piedra, [place] [facility] [place] [facility]  07/27/2019  4:19 PM

## 2019-07-27 NOTE — Progress Notes (Signed)
Subjective:  Overnight the patient had an episode of hypoglycemia after receiving 10 units of NovoLog without eating dinner.  Blood sugars reached as low as 53.  Received a dose of D50 sugars appropriately increased.  The night team adjusted the patient's insulin to Lantus 30 units nightly with moderate SSI.  Patient and patient's brother are present at the bedside.  Patient said that she is not in pain today.  Has some nausea, but no complaints at this time.  Patient was instructed to let us know if she started having any pain so we can adjust her medications.  Patient is in agreement.  Objective: CBC Latest Ref Rng & Units 07/27/2019 07/26/2019 07/25/2019  WBC 4.0 - 10.5 K/uL 8.9 7.1 7.6  Hemoglobin 12.0 - 15.0 g/dL 10.9(L) 12.0 12.1  Hematocrit 36.0 - 46.0 % 34.6(L) 37.7 37.2  Platelets 150 - 400 K/uL 335 283 344   BMP Latest Ref Rng & Units 07/27/2019 07/26/2019 07/25/2019  Glucose 70 - 99 mg/dL 100(H) 96 107(H)  BUN 8 - 23 mg/dL 16 21 16   Creatinine 0.44 - 1.00 mg/dL 0.93 0.91 1.11(H)  BUN/Creat Ratio 6 - 22 (calc) - - -  Sodium 135 - 145 mmol/L 138 141 139  Potassium 3.5 - 5.1 mmol/L 3.6 4.1 3.7  Chloride 98 - 111 mmol/L 104 104 105  CO2 22 - 32 mmol/L 28 22 26   Calcium 8.9 - 10.3 mg/dL 8.4(L) 9.1 9.5   Vital signs in last 24 hours: Vitals:   07/26/19 1506 07/26/19 2058 07/27/19 0142 07/27/19 0528  BP: 136/65 (!) 144/64 137/63 124/67  Pulse: 60 (!) 107 63 68  Resp: 14 17 17 20   Temp: 98.6 F (37 C) 98 F (36.7 C) 98.6 F (37 C) 99.5 F (37.5 C)  TempSrc: Oral Oral Oral Oral  SpO2: 96% (!) 63% 96% 93%  Weight:      Height:       Physical Exam General: Laying in bed, NAD HEENT: NCAT CV: RRR, normal S1 and S2, blowing systolic murmur appreciated at the R upper sternal border PULM: Clear to auscultation bilaterally ABD: Soft and nontender in all quadrants MSK: Left lower extremity wrapped in Ace wrap and a boot and elevated. Wound vac in place draining serosanguinous  fluid NEURO: Nonfocal  Assessment/Plan:  Active Problems:   Controlled type 2 diabetes mellitus with complication, with long-term current use of insulin (HCC)   Acute osteomyelitis of left calcaneus (HCC)   Osteomyelitis (HCC)   Cutaneous abscess of left foot  In summary, Ms. Dacosta is a 74 year old female with a past medical history significant for left calcaneus osteomyelitis, [id number] complicated by diabetic foot ulcers, PVD, HTN, HLD, and morbid obesity who presented with worsening left foot pain in the setting of known osteomyelitis that has been unresponsive to p.o. antibiotics in the outpatient setting.  #Osteomyelitis L foot:  #S/p L partial calcaneous excision: POD1. Patient's vital signs continue to be stable, and has no signs of sepsis at this time. MRI shows worsening osteomyelitis in the calcaneus and possibly medial talus. Orthopedics and infectious disease recommend BKA, but the patient is vehemently against this despite explanations of the risks and benefits.  -Consult Ortho, appreciate recommendations  -S/p partial calcaneous excision with Dr. Sharol Given. Will need 6 weeks of IV antibiotics -Consult ID, appreciate recommendations   -Start Vancomycin + ertapenem -Dilaudid 0.5-1 mg every 2 hours as needed -PT/OT recommend SNF, pt prefers HH  #T2DM: Patient takes 70/30 insulin 100 units in the morning  and 82 units at night in addition pioglitazone 40 mg daily at home. Hgb A1c 8.6 on admission. Pt had episode of hypoglycemia overnight to 53. S/p D50 with appropriate response. -Lantus 30 units nightly  -Novolog w/ meals (0-20 units) -Novolog night time correction (0-5 units)  #HTN #HLD  -Metoprolol 50 mg daily -Atorvastatin 40 mg daily  #FEN/GI -Diet: Carb modifed  -Fluids: None -Protonix 40 mg daily -Zofran 4 mg q6hrs PRN  #DVT prophylaxis -Lovenox per pharmacy  #CODE STATUS: DNR  #Dispo: Suspected d/c later this week or early next week Prior to Admission Living  Arrangement: Home Anticipated Discharge Location:  Home w/ HH Barriers to [place] workup  Earlene Plater, MD Internal Medicine, PGY1 Pager: [phone]  07/27/2019,7:23 AM

## 2019-07-27 NOTE — Progress Notes (Signed)
Inpatient Diabetes Program Recommendations  AACE/ADA: New Consensus Statement on Inpatient Glycemic Control (2015)  Target Ranges:  Prepandial:   less than 140 mg/dL      Peak postprandial:   less than 180 mg/dL (1-2 hours)      Critically ill patients:  140 - 180 mg/dL   Lab Results  Component Value Date   GLUCAP 92 07/27/2019   HGBA1C 8.6 (H) 07/22/2019    Review of Glycemic Control Results for Debra [name], Debra [name] (MRN [id number]) as of 07/27/2019 10:50  Ref. Range 07/26/2019 20:55 07/26/2019 21:25 07/26/2019 22:10 07/27/2019 07:39  Glucose-Capillary Latest Ref Range: 70 - 99 mg/dL 53 (L) 66 (L) 105 (H) 92   Diabetes history: Type 2 DM Outpatient Diabetes medications: Novolin 70/30 100 units QAM, 82 units QPM, Actos 30 mg QD Current orders for Inpatient glycemic control: Lantus 30 units QHS, Novolog 0-20 units TID, Novolog 0-5 units QHS  Inpatient Diabetes Program Recommendations:    Noted hypoglycemic event of 53 mg/dL following Novolog 10 of meal coverage. Since meal coverage discontinued.  If glucose trends continue, would recommend also decreasing correction to Novolog 0-9 units TID.   Thanks, Bronson Curb, MSN, RNC-OB Diabetes Coordinator [phone] (8a-5p)

## 2019-07-27 NOTE — Progress Notes (Signed)
Physical Therapy Treatment/Reeval Patient Details Name: Debra [name] MRN: [id number] DOB: [birth year] Today's Date: 07/27/2019    History of Present Illness 74 y.o. female who complains of left heel pain. 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 She is s/p LEFT PARTIAL CALCANEOUS EXCISION on 07/26/19 with TDWB status in [facility].    PT Comments    Pt is now s/p partial calcaneous excision with TDWB status in PRAFO.  POC and goals updated to reflect change in mobility status.  Pt requiring assist to maintain TDWB status for standing and pivots.  She was unsafe to ambulate due to inability to maintain TDWB status.  Pt with several barriers to returning home with TDWB status.  She was educated on these and is agreeable to considering SNF at d/c. Notified case Freight forwarder.   Follow Up Recommendations  SNF;Supervision/Assistance - 24 hour     Equipment Recommendations  Wheelchair cushion (measurements PT);Wheelchair (measurements PT);3in1 (PT)    Recommendations for Other Services       Precautions / Restrictions Precautions Precautions: Fall Required Braces or Orthoses: Other Brace Other Brace: PRAFO Restrictions Weight Bearing Restrictions: Yes LLE Weight Bearing: Touchdown weight bearing Other Position/Activity Restrictions: In [facility]    Mobility  Bed Mobility Overal bed mobility: Needs Assistance Bed Mobility: Supine to Sit     Supine to sit: Supervision;HOB elevated        Transfers Overall transfer level: Needs assistance Equipment used: Rolling walker (2 wheeled) Transfers: Sit to/from Stand Sit to Stand: Min assist;+2 safety/equipment;+2 physical assistance;From elevated surface Stand pivot transfers: Min assist       General transfer comment: Pt required cues for TDWB on L LE.   She had difficulty  maintaining TDWB during sit to stand but did well once in standing and during pivot with cues.  Ambulation/Gait             General Gait Details: defered due to TDWB and inability to maintain   Stairs Stairs: (Discussed would be unable to do stairs with TDWB status.  Pt reports brother building ramp - discussed safe grade of ramp)           Wheelchair Mobility    Modified Rankin (Stroke Patients Only)       Balance Overall balance assessment: Needs assistance Sitting-balance support: Feet supported Sitting balance-Leahy Scale: Good     Standing balance support: Bilateral upper extremity supported;During functional activity Standing balance-Leahy Scale: Poor Standing balance comment: Required RW                            Cognition Arousal/Alertness: Awake/alert Behavior During Therapy: WFL for tasks assessed/performed Overall Cognitive Status: Within Functional Limits for tasks assessed                                        Exercises General Exercises - Lower Extremity Ankle Circles/Pumps: AROM;10 reps;Both;Seated Long Arc Quad: AROM;10 reps;Left;Seated Hip Flexion/Marching: AROM;Left;10 reps;Seated Other Exercises Other Exercises: chair tricep push ups 3x5 with cues and assist for L LE TDWB    General Comments General comments (skin integrity, edema, etc.): Pt initially reporting wanting to go home at d/c.  Discussed concerns from therapy standpoint in regards to returning home  including safe ramp elevation to enter home, maintaining TDWB with every transfer and throughout ADLs, w/c fitting through doors , maintaining TDWB to allow healing.  Pt agrees and wants best opportunity to heal without further surgyer.  AGrees to consider SNF, will discuss with brother.      Pertinent Vitals/Pain Pain Assessment: 0-10 Pain Score: 4  Pain Location: L LE  Pain Descriptors / Indicators: Discomfort;Grimacing Pain Intervention(s): Limited  activity within patient's tolerance;Monitored during session;Relaxation;Repositioned    Home Living                      Prior Function            PT Goals (current goals can now be found in the care plan section) Acute Rehab PT Goals Patient Stated Goal: agreeable to consider SNF to improve mobility PT Goal Formulation: With patient Time For Goal Achievement: 08/10/19 Potential to Achieve Goals: Good Progress towards PT goals: Goals downgraded-see care plan(due to TDWB status)    Frequency    Min 3X/week      PT Plan Discharge plan needs to be updated    Co-evaluation PT/OT/SLP Co-Evaluation/Treatment: Yes Reason for Co-Treatment: Complexity of the patient's impairments (multi-system involvement);For patient/therapist safety PT goals addressed during session: Mobility/safety with mobility OT goals addressed during session: ADL's and self-care      AM-PAC PT "6 Clicks" Mobility   Outcome Measure  Help needed turning from your back to your side while in a flat bed without using bedrails?: None Help needed moving from lying on your back to sitting on the side of a flat bed without using bedrails?: None Help needed moving to and from a bed to a chair (including a wheelchair)?: None Help needed standing up from a chair using your arms (e.g., wheelchair or bedside chair)?: A Lot Help needed to walk in hospital room?: Total Help needed climbing 3-5 steps with a railing? : Total 6 Click Score: 16    End of Session Equipment Utilized During Treatment: Gait belt Activity Tolerance: Patient tolerated treatment well Patient left: in chair;with call bell/phone within reach Nurse Communication: Mobility status PT Visit Diagnosis: Pain;Difficulty in walking, not elsewhere classified (R26.2) Pain - Right/Left: Left Pain - part of body: Ankle and joints of foot     Time: [id number] PT Time Calculation (min) (ACUTE ONLY): 29 min  Charges:     Reeval                    Debra [name], PT Acute Rehab Services Pager [phone] [facility] Rehab [phone] [facility] [phone]    Debra [name] 07/27/2019, 12:53 PM

## 2019-07-27 NOTE — Progress Notes (Signed)
Orthopedic Tech Progress Note Patient Details:  Debra [name] March 15, 1946 [id number]  Ortho Devices Type of Ortho Device: Prafo boot/shoe Ortho Device/Splint Location: LLE Ortho Device/Splint Interventions: Ordered, Application   Post Interventions Patient Tolerated: Well Instructions Provided: Care of [place] 07/27/2019, 9:23 AM

## 2019-07-27 NOTE — TOC Progression Note (Signed)
Transition of Care [facility]) - Progression Note    Patient Details  Name: Debra [name] MRN: [id number] Date of Birth: 1945/10/16  Transition of Care [facility]) CM/SW Contact  Jacalyn Lefevre Edson Snowball, RN Phone Number: 07/27/2019, 3:55 PM  Clinical Narrative:     See previous notes. Patient willing to consider SNF now. Insurance auth submitted. Patient states depends on if she has any co pays if she goes to SNF. PAtient [place] to [place]. Her first choice is [facility] Called Olivia Mackie at [place] she is reviewing referral and pricing Vancomycin and Invanz.   Pam with [facility] aware and will continue to follow. Cassie with [facility] aware and will continue to follow.    Expected Discharge Plan: Skilled Nursing Facility Barriers to Discharge: Continued Medical Work up  Expected Discharge Plan and Services Expected Discharge Plan: [place]   Discharge Planning Services: CM Consult Post Acute Care Choice: [place] Living arrangements for the past 2 months: Single Family Home                 DME Arranged: 3-N-1, Walker rolling DME Agency: [facility] [place]: PT, OT, RN [place] Agency: [facility] (now [facility]) Date [place]: 07/25/19 Time [place]: 1050 Representative spoke with at [place]: [place], [place] reviewing will call NCM back   Social Determinants of Health (SDOH) Interventions    Readmission Risk Interventions No flowsheet data found.

## 2019-07-27 NOTE — Progress Notes (Signed)
Patient ID: Debra [name], female   DOB: Feb 28, 1946, 74 y.o.   MRN: [id number] Postoperative day 1 partial calcaneal excision.  Patient is nauseated from her pain medication will change this to tramadol.  Will order a PRAFO to protect the left heel.  Gram stain showing gram-positive cocci and gram-negative rods sensitivities pending.

## 2019-07-27 NOTE — Social Work (Addendum)
1:33pm- Prior auth tentatively submitted with [facility] Medicare. Ref [id number].   3:50pm- Per RNCM pt preference is for [facility], clinicals confirmed as received by [facility] and facility choice was added to authorization.  Westley Hummer, MSW, [place] Work

## 2019-07-28 LAB — C-REACTIVE PROTEIN: CRP: 10.1 mg/dL — ABNORMAL HIGH (ref <1.0)

## 2019-07-28 LAB — BASIC METABOLIC PANEL
Anion gap: 10 (ref 5–15)
BUN: 13 mg/dL (ref 8–23)
CO2: 26 mmol/L (ref 22–32)
Calcium: 8.5 mg/dL — ABNORMAL LOW (ref 8.9–10.3)
Chloride: 102 mmol/L (ref 98–111)
Creatinine, Ser: 0.97 mg/dL (ref 0.44–1.00)
GFR calc Af Amer: >60 mL/min (ref >60)
GFR calc non Af Amer: 58 mL/min — ABNORMAL LOW (ref >60)
Glucose, Bld: 93 mg/dL (ref 70–99)
Potassium: 3.6 mmol/L (ref 3.5–5.1)
Sodium: 138 mmol/L (ref 135–145)

## 2019-07-28 LAB — CBC
HCT: 34.2 % — ABNORMAL LOW (ref 36.0–46.0)
Hemoglobin: 10.9 g/dL — ABNORMAL LOW (ref 12.0–15.0)
MCH: 29.6 pg (ref 26.0–34.0)
MCHC: 31.9 g/dL (ref 30.0–36.0)
MCV: 92.9 fL (ref 80.0–100.0)
Platelets: 332 10*3/uL (ref 150–400)
RBC: 3.68 MIL/uL — ABNORMAL LOW (ref 3.87–5.11)
RDW: 13.4 % (ref 11.5–15.5)
WBC: 8.6 10*3/uL (ref 4.0–10.5)
nRBC: 0 % (ref 0.0–0.2)

## 2019-07-28 LAB — GLUCOSE, CAPILLARY
Glucose-Capillary: 76 mg/dL (ref 70–99)
Glucose-Capillary: 156 mg/dL — ABNORMAL HIGH (ref 70–99)
Glucose-Capillary: 88 mg/dL (ref 70–99)
Glucose-Capillary: 147 mg/dL — ABNORMAL HIGH (ref 70–99)

## 2019-07-28 LAB — SEDIMENTATION RATE: Sed Rate: 60 mm/hr — ABNORMAL HIGH (ref 0–22)

## 2019-07-28 MED ORDER — ENOXAPARIN SODIUM 60 MG/0.6ML ~~LOC~~ SOLN
50.0000 mg | SUBCUTANEOUS | Status: DC
  Administered 2019-07-28 – 2019-07-31 (×4): 50 mg via SUBCUTANEOUS
  Filled 2019-07-28 (×4): qty 0.6

## 2019-07-28 MED ORDER — VANCOMYCIN HCL 1250 MG/250ML IV SOLN
1250.0000 mg | INTRAVENOUS | Status: DC
  Administered 2019-07-28 – 2019-07-29 (×2): 1250 mg via INTRAVENOUS
  Filled 2019-07-28 (×3): qty 250

## 2019-07-28 MED ORDER — POLYETHYLENE GLYCOL 3350 17 G PO PACK
17.0000 g | PACK | Freq: Two times a day (BID) | ORAL | Status: DC
  Administered 2019-07-28 – 2019-07-29 (×2): 17 g via ORAL
  Filled 2019-07-28 (×5): qty 1

## 2019-07-28 MED ORDER — SENNA 8.6 MG PO TABS
1.0000 | ORAL_TABLET | Freq: Two times a day (BID) | ORAL | Status: DC
  Administered 2019-07-28 – 2019-07-29 (×3): 8.6 mg via ORAL
  Filled 2019-07-28 (×5): qty 1

## 2019-07-28 NOTE — TOC Progression Note (Signed)
Transition of Care [facility]) - Progression Note    Patient Details  Name: Debra [name] MRN: [id number] Date of Birth: 08-11-1945  [facility]) CM/SW Contact  Jacalyn Lefevre Edson Snowball, RN Phone Number: 07/28/2019, 12:51 PM  Clinical Narrative:     Insurance has approved patient to go to SNF for 5 days. Patient aware and agreeable. First choice is [facility]. Olivia Mackie at [facility] awaiting final determination on IV ABX. Patient aware.  Expected Discharge Plan: Skilled Nursing Facility Barriers to Discharge: Continued Medical Work up  Expected Discharge Plan and Services Expected Discharge Plan: [place]   Discharge Planning Services: CM Consult Post Acute Care Choice: [place] Living arrangements for the past 2 months: Single Family Home                 DME Arranged: 3-N-1, Walker rolling DME Agency: [facility] [place]: PT, OT, RN [place] Agency: [facility] (now [facility]) Date [place]: 07/25/19 Time [place]: 57 Representative spoke with at [place]: [place]elle [place], [place] reviewing will call NCM back   Social Determinants of Health (SDOH) Interventions    Readmission Risk Interventions Readmission Risk Prevention Plan 07/28/2019  Post Dischage Appt Not Complete  Appt Comments plan for SNF  Medication Screening Complete  Transportation Screening Complete  Some recent data might be hidden

## 2019-07-28 NOTE — TOC Progression Note (Signed)
Transition of Care [facility]) - Progression Note    Patient Details  Name: SHATANYA CLAYMAN MRN: [id number] Date of Birth: 06-Jul-1945  Transition of Care [facility]) CM/SW [place], [place] Phone Number: 07/28/2019, 12:46 PM  Clinical Narrative:    Josem Kaufmann received for [facility].  They are currently reviewing pt abx, await final ID recs. Approval is [id number]; auth# [id number]. Approved for 5 days, 5/7-5/11. Pt needs new COVID, Dr. Sheppard Coil aware.    Expected Discharge Plan: Skilled Nursing Facility Barriers to Discharge: Continued Medical Work up  Expected Discharge Plan and Services Expected Discharge Plan: [place]   Discharge Planning Services: CM Consult Post Acute Care Choice: [place] Living arrangements for the past 2 months: Single Family Home                 DME Arranged: 3-N-1, Walker rolling DME Agency: [facility] [place]: PT, OT, RN [place] Agency: [place] (now [facility]) Date [place]: 07/25/19 Time [place]: 84 Representative spoke with at [place], [place] reviewing will call NCM back   Readmission Risk Interventions No flowsheet data found.

## 2019-07-28 NOTE — Progress Notes (Addendum)
Subjective:   Debra [name] was seen as bedside this am. She states that she is feeling tired, because she had to get up and down last night because she had to urinate. She states that she does have pain, but not as bad as yesterday. She is also worried about her financial situation with SNF. She additionally endorses a headache for the last three days on the L side with no radiation. She additionally states that she hasn't had a BM since Monday. Otherwise no complaints.   Objective: CBC Latest Ref Rng & Units 07/28/2019 07/27/2019 07/26/2019  WBC 4.0 - 10.5 K/uL 8.6 8.9 7.1  Hemoglobin 12.0 - 15.0 g/dL 10.9(L) 10.9(L) 12.0  Hematocrit 36.0 - 46.0 % 34.2(L) 34.6(L) 37.7  Platelets 150 - 400 K/uL 332 335 283   BMP Latest Ref Rng & Units 07/28/2019 07/27/2019 07/26/2019  Glucose 70 - 99 mg/dL 93 100(H) 96  BUN 8 - 23 mg/dL '13 16 21  ' Creatinine 0.44 - 1.00 mg/dL 0.97 0.93 0.91  BUN/Creat Ratio 6 - 22 (calc) - - -  Sodium 135 - 145 mmol/L 138 138 141  Potassium 3.5 - 5.1 mmol/L 3.6 3.6 4.1  Chloride 98 - 111 mmol/L 102 104 104  CO2 22 - 32 mmol/L '26 28 22  ' Calcium 8.9 - 10.3 mg/dL 8.5(L) 8.4(L) 9.1   Vital signs in last 24 hours: Vitals:   07/27/19 0822 07/27/19 1407 07/27/19 2130 07/28/19 0528  BP: (!) 145/62 (!) 156/61 (!) 132/46 (!) 114/51  Pulse: 70 69 65 65  Resp:   17 17  Temp: 98.2 F (36.8 C) 98.7 F (37.1 C) 99.6 F (37.6 C) 99.8 F (37.7 C)  TempSrc: Oral Oral Oral Oral  SpO2: 96% 99% 94% 94%  Weight:      Height:       Physical Exam General: Laying in bed, NAD HEENT: NCAT CV: RRR, normal S1 and S2 blowing systolic murmur appreciated best at the right upper sternal border PULM: Clear bilaterally ABD: Soft and nontender MSK: LLE wrapped in Ace wrap and boot elevated.  Wound VAC in place NEURO: Alert and oriented, nonfocal  Assessment/Plan:  Active Problems:   Controlled type 2 diabetes mellitus with complication, with long-term current use of insulin (HCC)   Acute  osteomyelitis of left calcaneus (HCC)   Osteomyelitis (HCC)   Cutaneous abscess of left foot  In summary, Debra [name] is a 74 year old female with a past medical history significant for left calcaneus osteomyelitis, S3PR complicated by diabetic foot ulcers, PVD, HTN, HLD, and morbid obesity who presented with worsening left foot pain in the setting of known osteomyelitis that has been unresponsive to p.o. antibiotics in the outpatient setting. She is now status post left partial calcaneus excision on 5/5 and getting IV antibiotics.  #Osteomyelitis L foot:  #S/p L partial calcaneous excision: POD2. Patient's vital signs continue to be stable, and has no signs of sepsis at this time. MRI shows worsening osteomyelitis in the calcaneus and possibly medial talus. Orthopedics and infectious disease recommend BKA, but the patient is vehemently against this despite explanations of the risks and benefits.  PICC line was placed yesterday and the patient will continue IV antibiotics. -Consult Ortho, appreciate recommendations  -S/p partial calcaneous excision with Dr. Sharol Given on 5/5. Will need 6 weeks of IV antibiotics -Consult ID, appreciate recommendations   -D/c Vancomycin   -Continue ertapenem 1g daily for 6 weeks via PICC line (stop date 6/16)  -F/u culture sensitivities (currently pending) -Tramadol 50  mg every 6 hours as needed -Tylenol 650 mg every 6 hours as needed -Needs SNF placement  #Headache: Patient states she was having a headache for the past 3 days in the left temple area.  No vision changes.  Low suspicion for temporal arteritis -Obtain CRP and ESR -Tramadol and Tylenol PRN as noted above   #T2DM: Patient takes 70/30 insulin 100 units in the morning and 82 units at night in addition pioglitazone 40 mg daily at home. Hgb A1c 8.6 on admission.  Average glucose over the past 24 hours has been in the low 100s. -Lantus 30 units nightly  -Novolog w/ meals (0-20 units) -Novolog night time  correction (0-5 units)  #HTN #HLD  -Metoprolol 50 mg daily -Atorvastatin 40 mg daily  #FEN/GI -Diet: Carb modifed  -Fluids: None -Protonix 40 mg daily -Zofran 4 mg q6hrs PRN -Miralax and senna tablet BID ordered  #DVT prophylaxis -Lovenox per pharmacy  #CODE STATUS: DNR  #Dispo: Suspected d/c once SNF accepts her. [place] early next week Prior to Admission Living Arrangement: Home Anticipated Discharge Location:  Home w/ HH Barriers to [place] medical workup  Earlene Plater, MD Internal Medicine, PGY1 Pager: [phone]  07/28/2019,11:15 AM

## 2019-07-28 NOTE — Progress Notes (Signed)
Pharmacy Antibiotic Note  Debra [name] is a 74 y.o. female admitted on 07/22/2019 with Osteomyelitis of the left calcaneous. Patient underwent partial calcaneous excision on 5/5 with Dr. Sharol Given. Patient's surgical culture from 5/5 is now growing Group B strep and Staph haemolyticus. With new staphylococcus species growing in culture, pharmacy has been consulted to re-add vancomycin. Current SCr is 0.97 with estimated clearance of ~ 62 ml/min.   Ms. [name] received a loading dose of 2 gm yesterday ~1300. We will start Vancomycin 1250 mg every 24 hours using traditional vancomycin dosing.      Plan: Vancomycin 1250 IV every 24 hours.  Goal trough 15-20 mcg/mL.   - Follow surgical cultures and blood cultures  - Monitor clinical improvement and renal function  - Obtain vancomycin trough at steady state  - Will place OPAT orders today   Height: 5' 4.02" (162.6 cm) Weight: 104.3 kg (230 lb) IBW/kg (Calculated) : 54.74  Temp (24hrs), Avg:99.4 F (37.4 C), Min:98.7 F (37.1 C), Max:99.8 F (37.7 C)  Recent Labs  Lab 07/22/19 1014 07/22/19 1707 07/23/19 0305 07/24/19 0214 07/25/19 0255 07/26/19 0312 07/27/19 0138 07/28/19 0301  WBC 7.4  --    < > 6.8 7.6 7.1 8.9 8.6  CREATININE 1.08*  --    < > 0.95 1.11* 0.91 0.93 0.97  LATICACIDVEN 2.0* 1.1  --   --   --   --   --   --    < > = values in this interval not displayed.    Estimated Creatinine Clearance: 61.7 mL/min (by C-G formula based on SCr of 0.97 mg/dL).    Allergies  Allergen Reactions  . Banana Hives  . Hydrocodone Hives  . Neomycin-Polymyxin-Gramicidin Itching and Swelling    Eye drops caused swelling in face and rash and itching on arms and neck  . Penicillins Hives and Itching    She has tolerated keflex Has patient had a PCN reaction causing immediate rash, facial/tongue/throat swelling, SOB or lightheadedness with hypotension: Yes Has patient had a PCN reaction causing severe rash involving mucus membranes or skin  necrosis: No Has patient had a PCN reaction that required hospitalization: NO Has patient had a PCN reaction occurring within the last 10 years:NO    . Aspirin Nausea Only    And causes bruises  . Latex Hives and Rash  . Metformin And Related Diarrhea  . Voltaren [Diclofenac Sodium] Nausea And Vomiting    Antimicrobials this admission: 5/6 Ertapenem >>  5/6 Vancomycin >>   Dose adjustments this admission:   Microbiology results: 5/6 surgical cx: Staph haemolyticus and group b strep  5/1 BCx: ngtd 5/1 MRSA PCR: negative   Thank you for allowing pharmacy to be a part of this patient's care.  Jimmy Footman, PharmD, BCPS, BCIDP Infectious Diseases Clinical Pharmacist Phone: [phone] 07/28/2019 1:20 PM

## 2019-07-28 NOTE — TOC Progression Note (Signed)
Transition of Care [facility]) - Progression Note    Patient Details  Name: NAJAY WHITINGER MRN: [id number] Date of Birth: [birth year]  Transition of Care [facility]) CM/SW [place], [place] Phone Number: 07/28/2019, 3:24 PM  Clinical Narrative:    [facility] now not able to accept pt, [facility] has offered, pt has accepted bed. Claiborne Billings, admissions liaison aware. Pt auth updated with new facility choice. RN to get new swab for COVID.    Expected Discharge Plan: Skilled Nursing Facility Barriers to Discharge: Continued Medical Work up  Expected Discharge Plan and Services Expected Discharge Plan: [place]   Discharge Planning Services: CM Consult Post Acute Care Choice: [place] Living arrangements for the past 2 months: Single Family Home                 DME Arranged: 3-N-1, [place] rolling DME Agency: [facility] [place]: PT, OT, RN [place] Agency: [facility] (now [facility]) Date [place]: 07/25/19 Time [place]: 72 Representative spoke with at [employer]: Jonelle Sidle, [place] reviewing will call NCM back     Readmission Risk Interventions Readmission Risk Prevention Plan 07/28/2019  Post Dischage Appt Not Complete  Appt Comments plan for SNF  Medication Screening Complete  Transportation Screening Complete  Some recent data might be hidden

## 2019-07-28 NOTE — Progress Notes (Signed)
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 Given previous culture likely to be Morganella. Will continue current dose of ertapenem for 6 weeks with end date of June 16th based on her surgery date of 5/5. Continue wound care per Dr. Sharol Given. OPAT order placed. Will monitor culture and change antibiotics pending sensitivity results.  Schedule follow up office visit in the ID office in 3 weeks.   PLAN:  1. Continue ertapenem and vancomycin through 6/16.  2. OPAT orders below.  3. Wound care per Dr. Sharol Given 4. Continue to monitor cultures and adjust antibiotics accordingly. 5. Follow up in the ID office.   Diagnosis: Calcaneal osteomyelitis  Culture Result: Group B streptococcus / Staphylococcus hemolyticus   Allergies  Allergen Reactions  . Banana Hives  . Hydrocodone Hives  . Neomycin-Polymyxin-Gramicidin Itching and Swelling    Eye drops caused swelling in face and rash and itching on arms and neck  . Penicillins Hives and Itching    She has tolerated keflex Has patient had a PCN reaction causing immediate rash, facial/tongue/throat swelling, SOB or lightheadedness with hypotension: Yes Has patient had a PCN reaction causing severe rash involving mucus membranes or skin necrosis: No Has patient had a PCN reaction that required hospitalization: NO Has patient had a PCN reaction occurring within the last 10 years:NO    . Aspirin Nausea Only    And causes bruises  . Latex Hives and Rash  . Metformin And Related Diarrhea  . Voltaren [Diclofenac Sodium] Nausea And Vomiting    OPAT Orders Discharge antibiotics to be given via PICC line Discharge antibiotics: Per pharmacy protocol Aim for Vancomycin trough 15-20 or  AUC 400-550 (unless otherwise indicated) Duration: 6 weeks End Date: 09/06/19  [facility] Care Per Protocol:  Home health RN for IV administration and teaching; PICC line care and labs.    Labs weekly while on IV antibiotics: _X_ CBC with differential _X_ BMP BIWEEKLY __ CMP _X_ CRP _X_ ESR __ Vancomycin trough __ CK  _X_ Please pull PIC at completion of IV antibiotics __ Please leave PIC in place until doctor has seen patient or been notified  Fax weekly labs to [phone]  Clinic Follow Up Appt:  5/24 with Dr. Tommy Medal    Active Problems:   Controlled type 2 diabetes mellitus with complication, with long-term current use of insulin ([place])   Acute osteomyelitis of left calcaneus ([place])   Osteomyelitis ([place])   Cutaneous abscess of left foot   . atorvastatin  40 mg Oral QHS  . Chlorhexidine Gluconate Cloth  6 each Topical Daily  . insulin aspart  0-20 Units Subcutaneous TID WC  . insulin aspart  0-5 Units Subcutaneous QHS  . insulin glargine  30 Units Subcutaneous QHS  . metoprolol succinate  50 mg Oral Daily  . pantoprazole  40 mg Oral Daily  . polyethylene glycol  17 g Oral BID  . senna  1 tablet Oral BID  . sodium chloride flush  10-40 mL Intracatheter Q12H    SUBJECTIVE:  Afebrile overnight with no acute events. PICC line inserted today.    Allergies  Allergen Reactions  . Banana Hives  . Hydrocodone Hives  . Neomycin-Polymyxin-Gramicidin Itching and Swelling  Eye drops caused swelling in face and rash and itching on arms and neck  . Penicillins Hives and Itching    She has tolerated keflex Has patient had a PCN reaction causing immediate rash, facial/tongue/throat swelling, SOB or lightheadedness with hypotension: Yes Has patient had a PCN reaction causing severe rash involving mucus membranes or skin necrosis: No Has patient had a PCN reaction that required hospitalization: NO Has patient had a PCN reaction occurring within the last 10 years:NO    .  Aspirin Nausea Only    And causes bruises  . Latex Hives and Rash  . Metformin And Related Diarrhea  . Voltaren [Diclofenac Sodium] Nausea And Vomiting     Review of Systems: Review of Systems  Constitutional: Negative for chills, fever and weight loss.  Respiratory: Negative for cough, shortness of breath and wheezing.   Cardiovascular: Negative for chest pain and leg swelling.  Gastrointestinal: Negative for abdominal pain, constipation, diarrhea, nausea and vomiting.  Skin: Negative for rash.      OBJECTIVE: Vitals:   07/27/19 0822 07/27/19 1407 07/27/19 2130 07/28/19 0528  BP: (!) 145/62 (!) 156/61 (!) 132/46 (!) 114/51  Pulse: 70 69 65 65  Resp:   17 17  Temp: 98.2 F (36.8 C) 98.7 F (37.1 C) 99.6 F (37.6 C) 99.8 F (37.7 C)  TempSrc: Oral Oral Oral Oral  SpO2: 96% 99% 94% 94%  Weight:      Height:       Body mass index is 39.46 kg/m.  Physical Exam Constitutional:      General: She is not in acute distress.    Appearance: She is well-developed.  Cardiovascular:     Rate and Rhythm: Normal rate and regular rhythm.     Heart sounds: Normal heart sounds.  Pulmonary:     Effort: Pulmonary effort is normal.     Breath sounds: Normal breath sounds.  Skin:    General: Skin is warm and dry.  Neurological:     Mental Status: She is alert and oriented to person, place, and time.  Psychiatric:        Behavior: Behavior normal.        Thought Content: Thought content normal.        Judgment: Judgment normal.     Lab Results Lab Results  Component Value Date   WBC 8.6 07/28/2019   HGB 10.9 (L) 07/28/2019   HCT 34.2 (L) 07/28/2019   MCV 92.9 07/28/2019   PLT 332 07/28/2019    Lab Results  Component Value Date   CREATININE 0.97 07/28/2019   BUN 13 07/28/2019   NA 138 07/28/2019   K 3.6 07/28/2019   CL 102 07/28/2019   CO2 26 07/28/2019    Lab Results  Component Value Date   ALT 13 07/22/2019   AST 18 07/22/2019   ALKPHOS 87 07/22/2019    BILITOT 0.7 07/22/2019     Microbiology: Recent Results (from the past 240 hour(s))  Blood culture (routine x 2)     Status: None   Collection Time: 07/22/19 11:50 AM   Specimen: BLOOD  Result Value Ref Range Status   Specimen Description BLOOD LEFT ANTECUBITAL  Final   Special Requests   Final    BOTTLES DRAWN AEROBIC AND ANAEROBIC Blood Culture results may not be optimal due to an inadequate volume of blood received in culture bottles   Culture   Final    NO GROWTH 5 DAYS Performed at [facility], [address] [place], [place] [address]    Report Status 07/27/2019 FINAL  Final  Blood culture (routine x 2)     Status: None   Collection Time: 07/22/19 11:55 AM   Specimen: BLOOD LEFT HAND  Result Value Ref Range Status   Specimen Description BLOOD LEFT HAND  Final   Special Requests   Final    BOTTLES DRAWN AEROBIC AND ANAEROBIC Blood Culture results may not be optimal due to an inadequate volume of blood received in culture bottles   Culture   Final    NO GROWTH 5 DAYS Performed at [facility], [place] [address], [place], [place] [address]    Report Status 07/27/2019 FINAL  Final  SARS CORONAVIRUS 2 (TAT 6-24 HRS) Nasopharyngeal Nasopharyngeal Swab     Status: None   Collection Time: 07/22/19  4:12 PM   Specimen: Nasopharyngeal Swab  Result Value Ref Range Status   SARS Coronavirus 2 NEGATIVE NEGATIVE Final    Comment: (NOTE) SARS-CoV-2 target nucleic acids are NOT DETECTED. The SARS-CoV-2 RNA is generally detectable in upper and lower respiratory specimens during the acute phase of infection. Negative results do not preclude SARS-CoV-2 infection, do not rule out co-infections with other pathogens, and should not be used as the sole basis for treatment or other patient management decisions. Negative results must be combined with clinical observations, patient history, and epidemiological information. The expected result is Negative. Fact Sheet for Patients:  SugarRoll.be Fact Sheet for Healthcare Providers: https://www.woods-mathews.com/ This test is not yet approved or cleared by the [place] FDA and  has been authorized for detection and/or diagnosis of SARS-CoV-2 by FDA under an Emergency Use Authorization (EUA). This EUA will remain  in effect (meaning this test can be used) for the duration of the COVID-19 declaration under Section 56 4(b)(1) of the Act, 21 U.S.C. section 360bbb-3(b)(1), unless the authorization is terminated or revoked sooner. Performed at [facility], [place] [address], [place], [place] [address]   Surgical pcr screen     Status: None   Collection Time: 07/25/19  5:21 PM   Specimen: Nasal Mucosa; Nasal Swab  Result Value Ref Range Status   MRSA, PCR NEGATIVE NEGATIVE Final   Staphylococcus aureus NEGATIVE NEGATIVE Final    Comment: (NOTE) The Xpert SA Assay (FDA approved for NASAL specimens in patients 56 years of age and older), is one component of a comprehensive surveillance program. It is not intended to diagnose infection nor to guide or monitor treatment. Performed at [facility], [place] [address], [place], [place] [address]   Aerobic/Anaerobic Culture (surgical/deep wound)     Status: None (Preliminary result)   Collection Time: 07/26/19 12:11 PM   Specimen: PATH Other; Tissue  Result Value Ref Range Status   Specimen Description TISSUE  Final   Special Requests LEFT FOOT CALCANEOUS BONE AND TISSUE SPEC A  Final   Gram Stain   Final    ABUNDANT WBC PRESENT,BOTH PMN AND MONONUCLEAR ABUNDANT GRAM POSITIVE COCCI FEW GRAM NEGATIVE RODS    Culture   Final    ABUNDANT GROUP B STREP(S.AGALACTIAE)ISOLATED TESTING AGAINST S. AGALACTIAE NOT ROUTINELY PERFORMED DUE TO PREDICTABILITY OF AMP/PEN/VAN SUSCEPTIBILITY. CULTURE REINCUBATED FOR BETTER GROWTH Performed at [facility], [place] [address], [place], [place] [address]    Report Status PENDING   Incomplete     Terri Piedra, NP [facility] for [facility]  07/28/2019  10:43 AM

## 2019-07-28 NOTE — Progress Notes (Signed)
PHARMACY CONSULT NOTE FOR:  OUTPATIENT  PARENTERAL ANTIBIOTIC THERAPY (OPAT)  Indication: Left Calcaneal osteomyelitis Regimen: Vancomycin 1250 mg every 24 hours + Ertapenem 1 gm every 24 hours End date: 09/06/19  IV antibiotic discharge orders are pended. To discharging provider:  please sign these orders via discharge navigator,  Select New Orders & click on the button choice - Manage This Unsigned Work.     Thank you for allowing pharmacy to be a part of this patient's care.  Jimmy Footman, PharmD, BCPS, BCIDP Infectious Diseases Clinical Pharmacist Phone: [phone] 07/28/2019, 1:35 PM

## 2019-07-28 NOTE — Discharge Summary (Signed)
Name: Debra [name] MRN: [id number] DOB: 1945/04/12 74 y.o. PCP: Vicenta Aly, [place]  Date of Admission: 07/22/2019  9:54 AM Date of Discharge:  07/31/2019 Attending Physician: Velna Ochs, MD  Discharge Diagnosis: 1.  Left foot osteomyelitis   Discharge Medications: Allergies as of 07/31/2019      Reactions   Banana Hives   Hydrocodone Hives   Neomycin-polymyxin-gramicidin Itching, Swelling   Eye drops caused swelling in face and rash and itching on arms and neck   Penicillins Hives, Itching   She has tolerated keflex Has patient had a PCN reaction causing immediate rash, facial/tongue/throat swelling, SOB or lightheadedness with hypotension: Yes Has patient had a PCN reaction causing severe rash involving mucus membranes or skin necrosis: No Has patient had a PCN reaction that required hospitalization: NO Has patient had a PCN reaction occurring within the last 10 years:NO   Aspirin Nausea Only   And causes bruises   Latex Hives, Rash   Metformin And Related Diarrhea   Voltaren [diclofenac Sodium] Nausea And Vomiting      Medication List    STOP taking these medications   insulin NPH-regular Human (70-30) 100 UNIT/ML injection   omeprazole 20 MG capsule Commonly known as: PRILOSEC Replaced by: pantoprazole 40 MG tablet You also have another medication with the same name that you need to continue taking as instructed.     TAKE these medications   atorvastatin 40 MG tablet Commonly known as: LIPITOR TAKE 1 TABLET BY MOUTH EVERY DAY What changed: when to take this   diphenhydrAMINE 25 MG tablet Commonly known as: BENADRYL Take 25 mg by mouth at bedtime as needed for itching.   ertapenem  IVPB Commonly known as: INVANZ Inject 1 g into the vein daily. Indication:  Calcaneal osteomyelitis First Dose: Yes Last Day of Therapy:  09/06/19 Labs - Once weekly:  CBC/D and BMP, Labs - Every other week:  ESR and CRP Method of administration: Mini-Bag Plus /  Gravity Method of administration may be changed at the discretion of home infusion pharmacist based upon assessment of the patient and/or caregiver's ability to self-administer the medication ordered.   glucose blood test strip Commonly known as: Accu-Chek Aviva Plus Ell.65 check blood sugar twice daily as directed   ibuprofen 200 MG tablet Commonly known as: ADVIL Take 400 mg by mouth every 6 (six) hours as needed for headache (pain).   insulin aspart 100 UNIT/ML injection Commonly known as: novoLOG Inject 0-5 Units into the skin at bedtime.   insulin aspart 100 UNIT/ML injection Commonly known as: novoLOG Inject 0-20 Units into the skin 3 (three) times daily with meals.   insulin glargine 100 UNIT/ML injection Commonly known as: LANTUS Inject 0.3 mLs (30 Units total) into the skin at bedtime.   Insulin Pen Needle 32G X 4 MM Misc Commonly known as: BD Pen Needle Nano U/F Use once daily with the administration of Toujeo DX E11.65   metFORMIN 500 MG tablet Commonly known as: GLUCOPHAGE Take 1 tablet (500 mg total) by mouth daily with breakfast.   metoprolol succinate 50 MG 24 hr tablet Commonly known as: TOPROL-XL TAKE 1 TABLET BY MOUTH ONCE DAILY   naproxen sodium 220 MG tablet Commonly known as: ALEVE Take 220 mg by mouth daily as needed (pain).   omeprazole 40 MG capsule Commonly known as: PRILOSEC Take 40 mg by mouth daily. What changed: Another medication with the same name was removed. Continue taking this medication, and follow the directions you see  here.   pantoprazole 40 MG tablet Commonly known as: PROTONIX Take 1 tablet (40 mg total) by mouth daily. Replaces: omeprazole 20 MG capsule   pioglitazone 30 MG tablet Commonly known as: ACTOS Take 30 mg by mouth daily.   polyethylene glycol 17 g packet Commonly known as: MIRALAX / GLYCOLAX Take 17 g by mouth daily.   potassium chloride SA 20 MEQ tablet Commonly known as: KLOR-CON TAKE 1 TABLET(20 MEQ) BY  MOUTH DAILY What changed: See the new instructions.   senna 8.6 MG Tabs tablet Commonly known as: SENOKOT Take 1 tablet (8.6 mg total) by mouth daily.   traMADol 50 MG tablet Commonly known as: ULTRAM Take 50 mg by mouth every 8 (eight) hours as needed (pain).   valsartan 320 MG tablet Commonly known as: DIOVAN Take 320 mg by mouth daily.            Durable Medical Equipment  (From admission, onward)         Start     Ordered   07/25/19 1056  For home use only DME lightweight manual wheelchair with seat cushion  Once    Comments: Patient suffers from  Osteomyelitis L foot which impairs their ability to perform daily activities like ambulating  in the home.  A cane  will not resolve  issue with performing activities of daily living. A wheelchair will allow patient to safely perform daily activities. Patient is not able to propel themselves in the home using a standard weight wheelchair due to Osteomyelitis L foot. Patient can self propel in the lightweight wheelchair. Length of need lifetime . Accessories: elevating leg rests (ELRs), wheel locks, extensions and anti-tippers.  Seat and back cushions   07/25/19 1056   07/25/19 1045  For home use only DME 3 n 1  Once     07/25/19 1046           Discharge Care Instructions  (From admission, onward)         Start     Ordered   07/31/19 0000  Change dressing on IV access line weekly and PRN  (Home infusion instructions - [facility] )     07/31/19 1047         Disposition and follow-up:   Debra [name] was discharged from [facility] in [place] condition. At the hospital follow up visit please address:  1.  L foot osteomyelitis: Patient's had chronic osteomyelitis in his trialed and failed 3 different courses of p.o. antibiotics.  Presented with acute worsening of pain and drainage.  MRI demonstrated osteomyelitis of the calcaneus and medial talar head.  Orthopedics and infectious disease  recommended amputation, however the patient was vehemently against this.  S/p left partial calcaneus excision on 5/5 with Dr. Sharol Given. Cultures grew Staphylococcus hemolyticus  -Ertapenem via PICC line for 6-week course (Stop date 6/16) -F/u with Orthopedics and ID in the outpatient setting  2.  Labs / imaging needed at time of follow-up: none  3.  Pending labs/ test needing follow-up: None  Follow-up Appointments:    Hospital Course by problem list: 1.  L foot osteomyelitis  In summary, Debra [name] is a 74 year old female with a past medical history significant for left calcaneus osteomyelitis, Y6AY complicated by diabetic foot ulcers, PVD, HTN, HLD and morbid obesity who presented with worsening left foot pain in the setting of known osteomyelitis.  Her foot has been unresponsive to multiple p.o. antibiotic courses in the outpatient setting.  The  patient was experiencing increased pain and drainage from her left foot, so she presented to the ED.  MRI of the left foot demonstrated worsening osteomyelitis in the calcaneus and possible medial talus.  Orthopedics and Infectious Disease was consulted during the patient's hospitalization, and recommended BKA, however the patient was vehemently against this despite the explanations of the risks and benefits.  Ultimately, the patient underwent a left partial calcaneus excision which cultures grew out Staphylococcus hemolyticus (methicillin susceptible).  A PICC line was placed and the patient was started on ertapenem with a plan to complete a 6-week course on 6/16.  Discharge Vitals:   BP (!) 114/51 (BP Location: Left Arm)   Pulse 65   Temp 99.8 F (37.7 C) (Oral)   Resp 17   Ht 5' 4.02" (1.626 m)   Wt 104.3 kg   LMP 07/02/1973   SpO2 94%   BMI 39.46 kg/m   Pertinent Labs, Studies, and Procedures:  CBC Latest Ref Rng & Units 07/28/2019 07/27/2019 07/26/2019  WBC 4.0 - 10.5 K/uL 8.6 8.9 7.1  Hemoglobin 12.0 - 15.0 g/dL 10.9(L) 10.9(L) 12.0    Hematocrit 36.0 - 46.0 % 34.2(L) 34.6(L) 37.7  Platelets 150 - 400 K/uL 332 335 283   BMP Latest Ref Rng & Units 07/28/2019 07/27/2019 07/26/2019  Glucose 70 - 99 mg/dL 93 100(H) 96  BUN 8 - 23 mg/dL _0 Creatinine 0.44 - 1.00 mg/dL 0.97 0.93 0.91  BUN/Creat Ratio 6 - 22 (calc) - - -  Sodium 135 - 145 mmol/L 138 138 141  Potassium 3.5 - 5.1 mmol/L 3.6 3.6 4.1  Chloride 98 - 111 mmol/L 102 104 104  CO2 22 - 32 mmol/L _1 Calcium 8.9 - 10.3 mg/dL 8.5(L) 8.4(L) 9.1   L foot MRI: IMPRESSION 1. Osteomyelitis of the calcaneus and possibly of the medial talar head. 2. Large plantar heel ulceration, extending up through the medial band of the plantar fascia to the calcaneal spur. Small fluid collection just below the calcaneal spur draining to the cutaneous surface. 3. Distal tibialis posterior tendinopathy. 4. Cellulitis along the medial ankle and dorsal foot.  Discharge Instructions: Discharge Instructions    Call MD for:   Complete by: As directed    Call MD for:  difficulty breathing, headache or visual disturbances   Complete by: As directed    Call MD for:  extreme fatigue   Complete by: As directed    Call MD for:  hives   Complete by: As directed    Call MD for:  persistant dizziness or light-headedness   Complete by: As directed    Call MD for:  persistant nausea and vomiting   Complete by: As directed    Call MD for:  redness, tenderness, or signs of infection (pain, swelling, redness, odor or green/yellow discharge around incision site)   Complete by: As directed    Call MD for:  severe uncontrolled pain   Complete by: As directed    Call MD for:  temperature >100.4   Complete by: As directed    Change dressing on IV access line weekly and PRN   Complete by: As directed    Diet - low sodium heart healthy   Complete by: As directed    Discharge instructions   Complete by: As directed    Thank you for allowing [place] to take care of you during your  hospitalization.  Below is a summary of what we treated:  1.  Osteomyelitis -You had surgery on your foot for osteomyelitis. You will need to get 6 weeks of IV antibiotics.   -You will have antibiotics administered through your PICC line -She will continue physical therapy at the skilled nursing facility  2. Diabetes -We decreased the amount of insulin you were on in the hospital and you had great blood sugar control -Lantus 40 units nightly, Novolog 0-20 units three times a day with meals, Novolog 0-5 units at bedtime  3.  Follow-up -Please follow-up with the Infectious Disease doctors and Orthopedics to make sure your healing okay. -Please schedule a follow-up appoint with your primary care provider   Increase activity slowly   Complete by: As directed    Negative Pressure Wound Therapy - Incisional   Complete by: As directed    Show patient how to attach prevena pump     Signed: Earlene Plater, MD Internal Medicine, PGY1 Pager: [phone]  07/31/2019,10:48 AM

## 2019-07-29 LAB — CBC
HCT: 32.9 % — ABNORMAL LOW (ref 36.0–46.0)
Hemoglobin: 10.5 g/dL — ABNORMAL LOW (ref 12.0–15.0)
MCH: 29.4 pg (ref 26.0–34.0)
MCHC: 31.9 g/dL (ref 30.0–36.0)
MCV: 92.2 fL (ref 80.0–100.0)
Platelets: 303 10*3/uL (ref 150–400)
RBC: 3.57 MIL/uL — ABNORMAL LOW (ref 3.87–5.11)
RDW: 13.2 % (ref 11.5–15.5)
WBC: 9.1 10*3/uL (ref 4.0–10.5)
nRBC: 0 % (ref 0.0–0.2)

## 2019-07-29 LAB — BASIC METABOLIC PANEL
Anion gap: 10 (ref 5–15)
BUN: 13 mg/dL (ref 8–23)
CO2: 26 mmol/L (ref 22–32)
Calcium: 8.1 mg/dL — ABNORMAL LOW (ref 8.9–10.3)
Chloride: 100 mmol/L (ref 98–111)
Creatinine, Ser: 0.87 mg/dL (ref 0.44–1.00)
GFR calc Af Amer: >60 mL/min (ref >60)
GFR calc non Af Amer: >60 mL/min (ref >60)
Glucose, Bld: 91 mg/dL (ref 70–99)
Potassium: 3.4 mmol/L — ABNORMAL LOW (ref 3.5–5.1)
Sodium: 136 mmol/L (ref 135–145)

## 2019-07-29 LAB — SARS CORONAVIRUS 2 (TAT 6-24 HRS): SARS Coronavirus 2: NEGATIVE

## 2019-07-29 LAB — GLUCOSE, CAPILLARY
Glucose-Capillary: 92 mg/dL (ref 70–99)
Glucose-Capillary: 113 mg/dL — ABNORMAL HIGH (ref 70–99)
Glucose-Capillary: 164 mg/dL — ABNORMAL HIGH (ref 70–99)
Glucose-Capillary: 127 mg/dL — ABNORMAL HIGH (ref 70–99)

## 2019-07-29 MED ORDER — POTASSIUM CHLORIDE CRYS ER 20 MEQ PO TBCR
20.0000 meq | EXTENDED_RELEASE_TABLET | Freq: Two times a day (BID) | ORAL | Status: AC
  Administered 2019-07-29 (×2): 20 meq via ORAL
  Filled 2019-07-29 (×2): qty 1

## 2019-07-29 NOTE — Social Work (Signed)
Insurance authorization received [id number]. Good 5/8 thru 5/11.   [employer], LCSWA

## 2019-07-29 NOTE — Progress Notes (Signed)
Subjective: No acute events overnight. Patient seen and evaluated at bedside this morning. She is doing ok, but feels a little "whoozy" after getting pain medication. Foot pain well controlled. She plans on getting up out of bed today once the medication side effect subsides. Still little appetite, but doing her best to eat.   Objective:  Vital signs in last 24 hours: Vitals:   07/28/19 0528 07/28/19 1514 07/28/19 2031 07/29/19 0442  BP: (!) 114/51 (!) 150/64 (!) 155/73 (!) 121/58  Pulse: 65 67 81 70  Resp: '17 16 17 14  ' Temp: 99.8 F (37.7 C) 98.7 F (37.1 C) 98.5 F (36.9 C) 98.4 F (36.9 C)  TempSrc: Oral Oral Oral Oral  SpO2: 94% 97% 98% 92%  Weight:      Height:       General: awake, alert, lying comfortably in bed CV: RRR; SEM at RUSB  Pulm: normal work of breathing; CTAB Abd: soft, non-tender MSK: LLE wrapped in Ace wrap and boot elevated; wound vac in place; able to wiggle toes on foot    Assessment/Plan:  Active Problems:   Controlled type 2 diabetes mellitus with complication, with long-term current use of insulin (HCC)   Acute osteomyelitis of left calcaneus (HCC)   Osteomyelitis (HCC)   Cutaneous abscess of left foot  In summary, Debra [name] is a 74 year old female with a past medical history significant forleft calcaneusosteomyelitis, G9JM complicated by diabetic foot ulcers,PVD,HTN, HLD, and morbid obesity who presentedwith worsening left foot pain in the setting of known osteomyelitis that has been unresponsive to p.o. antibiotics in the outpatient setting. She is now status post left partial calcaneus excision on 5/5 and getting IV antibiotics.  #Osteomyelitis L foot:  #S/p L partial calcaneous excision: POD2. Patient's vital signs continue to be stable, and has no signs of sepsis at this time. MRI shows worsening osteomyelitis in the calcaneus and possibly medial talus. Orthopedics and infectious disease recommend BKA, but the patient is vehemently  against this despite explanations of the risks and benefits.  PICC line was placed yesterday and the patient will continue IV antibiotics. -Consult Ortho, appreciate recommendations             -S/p partial calcaneous excision with Dr. Sharol Given on 5/5. Will need 6 weeks of IV antibiotics -Consult ID, appreciaterecommendations              -will remain on vanc and ertapenem until final cx sensitivities result (abx stop date 6/16) -Tramadol 50 mg every 6 hours as needed -Tylenol 650 mg every 6 hours as needed -Needs SNF placement  #Headache: Patient states she was having a headache for the past 3 days in the left temple area.  No vision changes.  Low suspicion for temporal arteritis -CRP and ESR elevated, she has had recent surgery and known progressive OM -headache resolved on evaluation today  -Tramadol and Tylenol PRN as noted above   #T2DM:Patient takes70/30 insulin100 units in the morning and 82 units at nightin addition pioglitazone 40 mg daily at home. Hgb A1c 8.6 on admission.  Average glucose over the past 24 hours has been in the low 100s. -Lantus 30 units nightly  -Novolog w/ meals (0-20 units) -Novolog night time correction (0-5 units)  #HTN #HLD  -Metoprolol 50 mg daily -Atorvastatin 40 mg daily  #FEN/GI -Diet: Carb modifed  -Fluids:None -Protonix 40 mg daily -Zofran 4 mg q6hrs PRN -Miralax and senna tablet BID ordered  #DVT prophylaxis -Lovenox per pharmacy  #CODE STATUS:DNR Prior to  Admission Living Arrangement: home Anticipated Discharge Location: SNF Barriers to Discharge: continued medical work-up  Dispo: Anticipated discharge in approximately 2-3 day(s).   Debra [name] D, DO 07/29/2019, 6:07 AM Pager: [phone]

## 2019-07-29 NOTE — Progress Notes (Signed)
Patient ID: [name], female   DOB: 19-Jul-1945, 74 y.o.   MRN: [id number]          [facility] for Infectious Disease    Date of Admission:  07/22/2019   Total days of antibiotics 3         The staph hemolyticus isolate is methicillin susceptible so I will stop vancomycin now and continue IV ertapenem alone for her calcaneal osteomyelitis.  I will sign off now.  Diagnosis: Calcaneal osteomyelitis  Culture Result: Group B streptococcus / Staphylococcus hemolyticus        Allergies  Allergen Reactions  . Banana Hives  . Hydrocodone Hives  . Neomycin-Polymyxin-Gramicidin Itching and Swelling    Eye drops caused swelling in face and rash and itching on arms and neck  . Penicillins Hives and Itching    She has tolerated keflex Has patient had a PCN reaction causing immediate rash, facial/tongue/throat swelling, SOB or lightheadedness with hypotension: Yes Has patient had a PCN reaction causing severe rash involving mucus membranes or skin necrosis: No Has patient had a PCN reaction that required hospitalization: NO Has patient had a PCN reaction occurring within the last 10 years:NO    . Aspirin Nausea Only    And causes bruises  . Latex Hives and Rash  . Metformin And Related Diarrhea  . Voltaren [Diclofenac Sodium] Nausea And Vomiting    OPAT Orders Discharge antibiotics to be given via PICC line Discharge antibiotics: Ertapenem Per pharmacy protocol  Duration: 6 weeks End Date: 09/06/19  [facility] Care Per Protocol:  Home health RN for IV administration and teaching; PICC line care and labs.   Labs weekly while on IV antibiotics: _X_ CBC with differential _X_ BMP BIWEEKLY __ CMP _X_ CRP _X_ ESR __ Vancomycin trough __ CK  _X_ Please pull PIC at completion of IV antibiotics __ Please leave PIC in place until doctor has seen patient or been notified  Fax weekly labs to [phone]  Clinic Follow Up Appt: 5/24 with Dr. Erlinda Hong, [place] for [place] 336 [phone] pager   [phone] cell 07/29/2019, 12:45 PM

## 2019-07-30 LAB — BASIC METABOLIC PANEL
Anion gap: 6 (ref 5–15)
BUN: 14 mg/dL (ref 8–23)
CO2: 26 mmol/L (ref 22–32)
Calcium: 8.1 mg/dL — ABNORMAL LOW (ref 8.9–10.3)
Chloride: 105 mmol/L (ref 98–111)
Creatinine, Ser: 0.84 mg/dL (ref 0.44–1.00)
GFR calc Af Amer: >60 mL/min (ref >60)
GFR calc non Af Amer: >60 mL/min (ref >60)
Glucose, Bld: 117 mg/dL — ABNORMAL HIGH (ref 70–99)
Potassium: 3.8 mmol/L (ref 3.5–5.1)
Sodium: 137 mmol/L (ref 135–145)

## 2019-07-30 LAB — CBC
HCT: 33 % — ABNORMAL LOW (ref 36.0–46.0)
Hemoglobin: 10.3 g/dL — ABNORMAL LOW (ref 12.0–15.0)
MCH: 28.8 pg (ref 26.0–34.0)
MCHC: 31.2 g/dL (ref 30.0–36.0)
MCV: 92.2 fL (ref 80.0–100.0)
Platelets: 296 10*3/uL (ref 150–400)
RBC: 3.58 MIL/uL — ABNORMAL LOW (ref 3.87–5.11)
RDW: 13.3 % (ref 11.5–15.5)
WBC: 8 10*3/uL (ref 4.0–10.5)
nRBC: 0 % (ref 0.0–0.2)

## 2019-07-30 LAB — GLUCOSE, CAPILLARY
Glucose-Capillary: 107 mg/dL — ABNORMAL HIGH (ref 70–99)
Glucose-Capillary: 132 mg/dL — ABNORMAL HIGH (ref 70–99)
Glucose-Capillary: 125 mg/dL — ABNORMAL HIGH (ref 70–99)
Glucose-Capillary: 100 mg/dL — ABNORMAL HIGH (ref 70–99)

## 2019-07-30 LAB — AEROBIC/ANAEROBIC CULTURE W GRAM STAIN (SURGICAL/DEEP WOUND)

## 2019-07-30 LAB — SARS CORONAVIRUS 2 (TAT 6-24 HRS): SARS Coronavirus 2: NEGATIVE

## 2019-07-30 MED ORDER — SENNA 8.6 MG PO TABS
1.0000 | ORAL_TABLET | Freq: Every day | ORAL | Status: DC
  Filled 2019-07-30: qty 1

## 2019-07-30 MED ORDER — POLYETHYLENE GLYCOL 3350 17 G PO PACK
17.0000 g | PACK | Freq: Every day | ORAL | Status: DC
  Filled 2019-07-30: qty 1

## 2019-07-30 NOTE — Progress Notes (Signed)
PHARMACY CONSULT NOTE FOR:  OUTPATIENT  PARENTERAL ANTIBIOTIC THERAPY (OPAT) (5/9 Update)  Indication: Left Calcaneal osteomyelitis Regimen: Ertapenem 1 gm every 24 hours End date: 09/06/19  IV antibiotic discharge orders are pended. To discharging provider:  please sign these orders via discharge navigator,  Select New Orders & click on the button choice - Manage This Unsigned Work.     Thank you for the interesting consult and for involving pharmacy in this patient's care.  Tamela Gammon, PharmD, BCPS 07/30/2019 7:14 AM PGY-2 Pharmacy Administration Resident Please check [email or web address] for unit-specific pharmacist phone numbers

## 2019-07-30 NOTE — TOC Progression Note (Signed)
Transition of Care [facility]) - Progression Note    Patient Details  Name: Debra [name] MRN: [id number] Date of Birth: [birth year]  Transition of Care [facility]) CM/SW [place], [place] Phone Number: 07/30/2019, 10:26 AM  Clinical Narrative     New COVID request was put in today for patient. Insurance authorization has been received [id number]. Insurance authorization will be good through 5/11.  Patient has bed at [facility]. Insurance authorization has been received.CSW will continue to follow.    Expected Discharge Plan: Skilled Nursing Facility Barriers to Discharge: Continued Medical Work up  Expected Discharge Plan and Services Expected Discharge Plan: [place]   Discharge Planning Services: CM Consult Post Acute Care Choice: [place] Living arrangements for the past 2 months: Single Family Home                 DME Arranged: 3-N-1, Walker rolling DME Agency: [facility]ospice and [place]: PT, OT, RN [place] Agency: [facility] (now [facility]) Date [place]: 07/25/19 Time [place]: 44 Representative spoke with at [place]: Jonelle Sidle, Tiffanu reviewing will call NCM back   Social Determinants of Health (SDOH) Interventions    Readmission Risk Interventions Readmission Risk Prevention Plan 07/28/2019  Post Dischage Appt Not Complete  Appt Comments plan for SNF  Medication Screening Complete  Transportation Screening Complete  Some recent data might be hidden

## 2019-07-30 NOTE — Plan of Care (Signed)
  Problem: Education: Goal: Knowledge of General Education information will improve Description Including pain rating scale, medication(s)/side effects and non-pharmacologic comfort measures Outcome: Progressing   

## 2019-07-30 NOTE — Progress Notes (Signed)
   Subjective:   Pt seen at the bedside this AM.  Patient is doing well and has no complaints today.  The pain in her foot is manageable.  She states that she did not sleep well last night that she was up at 2:30 AM.  She is feeling tired today.  She understands that she will continue antibiotics for the next 6 weeks.  No other complaints at this time.  Objective:  Vital signs in last 24 hours: Vitals:   07/29/19 0442 07/29/19 1430 07/29/19 2150 07/30/19 0536  BP: (!) 121/58 116/63 (!) 128/56 140/63  Pulse: 70 68 75 72  Resp: 14  15 17   Temp: 98.4 F (36.9 C) 97.8 F (36.6 C) 99.4 F (37.4 C) (!) 97.5 F (36.4 C)  TempSrc: Oral Oral Oral Oral  SpO2: 92% 98% 96% 96%  Weight:      Height:       Physical Exam General: Appears comfortable, NAD CV: RRR, normal S1 and S2, blowing systolic murmur appreciated best at the right upper sternal border PULM: Clear to auscultation bilateral ABD: Soft and nontender MSK: Left lower extremity Ace wrap in addition to boot.  Wound VAC in place  Assessment/Plan:  Active Problems:   Controlled type 2 diabetes mellitus with complication, with long-term current use of insulin (HCC)   Acute osteomyelitis of left calcaneus (HCC)   Osteomyelitis (HCC)   Cutaneous abscess of left foot  In summary, Ms. Finneran is a 74 year old female with a past medical history significant forleft calcaneusosteomyelitis, [id number] complicated by diabetic foot ulcers,PVD,HTN, HLD, and morbid obesity who presentedwith worsening left foot pain in the setting of known osteomyelitis that has been unresponsive to p.o. antibiotics in the outpatient setting. She is now status post left partial calcaneus excision on 5/5 and getting IV antibiotics.  #Osteomyelitis L foot:  #S/p L partial calcaneous excision: POD4. MRI shows worsening osteomyelitis in the calcaneus and possibly medial talus. Orthopedics and infectious disease recommend BKA, but the patient is vehemently against  this despite explanations of the risks and benefits. PICC line is in place and the patient will continue IV antibiotics. -Consult ID, appreciaterecommendations              -Staph hemolyticus isolate is methicillin susceptible, d/c vanc and continue ertapenem alone (abx stop date 6/16) -Tramadol 50 mg every 6 hours as needed -Tylenol 650 mg every 6 hours as needed  #T2DM:Patient takes70/30 insulin100 units in the morning and 82 units at nightin addition pioglitazone 40 mg daily at home. Hgb A1c 8.6 on admission.   I suspect the patient does not need that much insulin in the outpatient setting, as her blood sugars have been well controlled with much less insulin.   -Lantus 30 units nightly  -Novolog w/ meals (0-20 units) -Novolog night time correction (0-5 units)  #HTN #HLD  -Metoprolol 50 mg daily -Atorvastatin 40 mg daily  #FEN/GI -Diet: Carb modifed  -Fluids:None -Protonix 40 mg daily -Zofran 4 mg q6hrs PRN -Miralax and senna tablet once daily ordered  #DVT prophylaxis -Lovenox 50 mg subq injections daily  #CODE STATUS:DNR  #Dispo: Anticipated discharge in 1-2 days Prior to Admission Living Arrangement: home Anticipated Discharge Location: SNF Barriers to Discharge: SNF placement  Earlene Plater, MD Internal Medicine, PGY1 Pager: [phone]  07/30/2019,7:14 AM

## 2019-07-31 LAB — BASIC METABOLIC PANEL
Anion gap: 10 (ref 5–15)
BUN: 12 mg/dL (ref 8–23)
CO2: 24 mmol/L (ref 22–32)
Calcium: 8.5 mg/dL — ABNORMAL LOW (ref 8.9–10.3)
Chloride: 104 mmol/L (ref 98–111)
Creatinine, Ser: 0.8 mg/dL (ref 0.44–1.00)
GFR calc Af Amer: >60 mL/min (ref >60)
GFR calc non Af Amer: >60 mL/min (ref >60)
Glucose, Bld: 154 mg/dL — ABNORMAL HIGH (ref 70–99)
Potassium: 3.6 mmol/L (ref 3.5–5.1)
Sodium: 138 mmol/L (ref 135–145)

## 2019-07-31 LAB — CBC
HCT: 33.1 % — ABNORMAL LOW (ref 36.0–46.0)
Hemoglobin: 10.6 g/dL — ABNORMAL LOW (ref 12.0–15.0)
MCH: 29.5 pg (ref 26.0–34.0)
MCHC: 32 g/dL (ref 30.0–36.0)
MCV: 92.2 fL (ref 80.0–100.0)
Platelets: 298 10*3/uL (ref 150–400)
RBC: 3.59 MIL/uL — ABNORMAL LOW (ref 3.87–5.11)
RDW: 13.2 % (ref 11.5–15.5)
WBC: 6.9 10*3/uL (ref 4.0–10.5)
nRBC: 0 % (ref 0.0–0.2)

## 2019-07-31 LAB — GLUCOSE, CAPILLARY
Glucose-Capillary: 116 mg/dL — ABNORMAL HIGH (ref 70–99)
Glucose-Capillary: 133 mg/dL — ABNORMAL HIGH (ref 70–99)

## 2019-07-31 MED ORDER — INSULIN GLARGINE 100 UNIT/ML ~~LOC~~ SOLN: 30.0000 [IU] | Freq: Every day | SUBCUTANEOUS | 11 refills | Status: DC

## 2019-07-31 MED ORDER — INSULIN ASPART 100 UNIT/ML ~~LOC~~ SOLN: 0.0000 [IU] | Freq: Every day | SUBCUTANEOUS | 11 refills | Status: DC

## 2019-07-31 MED ORDER — TRAMADOL HCL 50 MG PO TABS: 50.0000 mg | ORAL_TABLET | Freq: Three times a day (TID) | ORAL | 0 refills | Status: DC | PRN

## 2019-07-31 MED ORDER — ERTAPENEM IV (FOR PTA / DISCHARGE USE ONLY): 1.0000 g | INTRAVENOUS | 0 refills | Status: AC

## 2019-07-31 MED ORDER — INSULIN ASPART 100 UNIT/ML ~~LOC~~ SOLN: 0.0000 [IU] | Freq: Three times a day (TID) | SUBCUTANEOUS | 11 refills | Status: DC

## 2019-07-31 MED ORDER — POLYETHYLENE GLYCOL 3350 17 G PO PACK: 17.0000 g | PACK | Freq: Every day | ORAL | 0 refills | Status: DC

## 2019-07-31 MED ORDER — PANTOPRAZOLE SODIUM 40 MG PO TBEC: 40.0000 mg | DELAYED_RELEASE_TABLET | Freq: Every day | ORAL | 0 refills | Status: DC

## 2019-07-31 MED ORDER — SENNA 8.6 MG PO TABS: 1.0000 | ORAL_TABLET | Freq: Every day | ORAL | 0 refills | Status: DC

## 2019-07-31 MED ORDER — HEPARIN SOD (PORK) LOCK FLUSH 100 UNIT/ML IV SOLN
250.0000 [IU] | INTRAVENOUS | Status: AC | PRN
  Administered 2019-07-31: 13:00:00 250 [IU]
  Filled 2019-07-31: qty 3

## 2019-07-31 NOTE — Progress Notes (Signed)
Report called to [facility] health care center. Amandep, RN took report

## 2019-07-31 NOTE — Progress Notes (Signed)
PT Cancellation Note  Patient Details Name: BUENA MCKELLER MRN: [id number] DOB: 05-01-1945   Cancelled Treatment:    Reason Eval/Treat Not Completed: Other (comment).  Pt reports she prefers to wait for PT in new facility, asked politely to hold therapy today.  Will reattempt tomorrow if dc is held.   Ramond Dial 07/31/2019, 12:33 PM   Mee Hives, PT MS Acute Rehab Dept. Number: [place] and [place]

## 2019-07-31 NOTE — TOC Progression Note (Signed)
Transition of Care [facility]) - Progression Note    Patient Details  Name: Debra [name] MRN: [id number] Date of Birth: 09/16/1945  Transition of Care [facility]) CM/SW [place], [place] Phone Number: 07/31/2019, 10:40 AM  Clinical Narrative:    Pt stable for d/c per MD. Negative COVID resulted, pt has bed at [facility]. CSW has received auth, good through 5/11 (confirmed with [facility]).    Expected Discharge Plan: Skilled Nursing Facility Barriers to Discharge: Continued Medical Work up  Expected Discharge Plan and Services Expected Discharge Plan: [place]   Discharge Planning Services: CM Consult Post Acute Care Choice: [place] Living arrangements for the past 2 months: Single Family Home                 DME Arranged: 3-N-1, Walker rolling DME Agency: [facility]osp[facility] and [place]: PT, OT, RN [place] Agency: [facility] ([facility]) Date [place]: 07/25/19 Time [place]: 75 Representative spoke with at [place]: Jonelle Sidle, Tiffanu reviewing will call NCM back  Readmission Risk Interventions Readmission Risk Prevention Plan 07/28/2019  Post Dischage Appt Not Complete  Appt Comments plan for SNF  Medication Screening Complete  Transportation Screening Complete  Some recent data might be hidden

## 2019-07-31 NOTE — Social Work (Signed)
Clinical Social Worker facilitated patient discharge including contacting patient family and facility to confirm patient discharge plans.  Clinical information faxed to facility and family agreeable with plan.  CSW arranged ambulance transport via [facility] to [facility] RN to call [phone]  with report prior to discharge.  Clinical Social Worker will sign off for now as social work intervention is no longer needed. Please consult [place] again if new need arises.  Westley Hummer, MSW, LCSW Clinical Social Worker

## 2019-07-31 NOTE — Care Management Important Message (Signed)
Important Message  Patient Details  Name: Debra [name] MRN: [id number] Date of Birth: March 20, 1946   Medicare Important Message Given:  Yes     Orbie Pyo 07/31/2019, 3:06 PM

## 2019-07-31 NOTE — Progress Notes (Signed)
   Subjective:   Pt seen at the bedside this AM. She was concerned about the drainage coming from her foot which we explained was normal. She is starting to get better with mobilization. Pain well controlled.  Hopeful for discharge to rehab today.  No other complaints at this time.  Objective:  Vital signs in last 24 hours: Vitals:   07/30/19 0816 07/30/19 1338 07/30/19 2033 07/31/19 0547  BP: 135/61 (!) 142/49 (!) 147/71 (!) 132/58  Pulse: 94 65 76 63  Resp: 18 18 17 17   Temp: 98.3 F (36.8 C) 98.3 F (36.8 C) 99.6 F (37.6 C) 98 F (36.7 C)  TempSrc: Oral Axillary Oral Oral  SpO2: 94% 99% 98% 99%  Weight:      Height:       Physical Exam General: Sitting up in bed, appears comfortable CV: RRR, normal S12 no murmurs rubs or gallops PULM: Clear to auscultation bialterally ABD: Soft and nontender in all quadrants MSK:  wound VAC in place on L ankle  Assessment/Plan:  Active Problems:   Controlled type 2 diabetes mellitus with complication, with long-term current use of insulin (HCC)   Acute osteomyelitis of left calcaneus (HCC)   Osteomyelitis (HCC)   Cutaneous abscess of left foot  In summary, Ms. Dorenkamp is a 74 year old female with a past medical history significant forleft calcaneusosteomyelitis, 123456 complicated by diabetic foot ulcers,PVD,HTN, HLD, and morbid obesity who presentedwith worsening left foot pain in the setting of known osteomyelitis that has been unresponsive to p.o. antibiotics in the outpatient setting. She is now status post left partial calcaneus excision on 5/5 and getting IV antibiotics.  #Osteomyelitis L foot:  #S/p L partial calcaneous excision: POD4. MRI shows worsening osteomyelitis in the calcaneus and possibly medial talus. Orthopedics and infectious disease recommend BKA, but the patient is vehemently against this despite explanations of the risks and benefits. PICC line is in place and the patient will continue IV antibiotics. -Consult  ID, appreciaterecommendations              -Staph hemolyticus isolate is methicillin susceptible, d/c vanc and continue ertapenem alone via PICC line (abx stop date 6/16) -Tramadol 50 mg every 6 hours as needed -Tylenol 650 mg every 6 hours as needed  #T2DM:Patient takes70/30 insulin100 units in the morning and 82 units at nightin addition pioglitazone 40 mg daily at home. Hgb A1c 8.6 on admission.   I suspect the patient does not need that much insulin in the outpatient setting, as her blood sugars have been well controlled with much less insulin.   -Lantus 30 units nightly  -Novolog w/ meals (0-20 units) -Novolog night time correction (0-5 units)  #HTN #HLD  -Metoprolol 50 mg daily -Atorvastatin 40 mg daily  #FEN/GI -Diet: Carb modifed  -Fluids:None -Protonix 40 mg daily -Zofran 4 mg q6hrs PRN -Miralax and senna tablet once daily ordered  #DVT prophylaxis -Lovenox 50 mg subq injections daily  #CODE STATUS:DNR  #Dispo: Anticipated discharge today Prior to Admission Living Arrangement: home Anticipated Discharge Location: SNF Barriers to Discharge: none  Earlene Plater, MD Internal Medicine, PGY1 Pager: [phone]  07/31/2019,7:40 AM

## 2019-07-31 NOTE — TOC Transition Note (Signed)
Transition of Care [facility]) - CM/SW Discharge Note   Patient Details  Name: Debra [name] MRN: [id number] Date of Birth: 1945/10/10  [facility] CM/SW Contact:  Alexander Mt, LCSW Phone Number: 07/31/2019, 11:44 AM   Clinical Narrative:    Pt cleared for d/c. MD to sign controlled script and pt DNR on chart for MD signature. PTAR called for 2pm. Pt aware and ready for discharge.    Final next level of care: Skilled Nursing Facility Barriers to Discharge: Barriers Resolved   Patient Goals and CMS Choice Patient states their goals for this hospitalization and ongoing recovery are:: to go to rehab CMS Medicare.gov Compare Post Acute Care list provided to:: Patient Choice offered to / list presented to : Patient  Discharge Placement PASRR number recieved: 07/27/19            Patient chooses bed at: [facility] Patient to be transferred to facility by: [place] Name of family member notified: pt responsible for self, will update her brother Patient and family notified of of transfer: 07/31/19  Discharge Plan and Services   Discharge Planning Services: CM Consult Post Acute Care Choice: [place]          DME Arranged: 3-N-1, Walker rolling DME Agency: [facility]ospice and [place]: PT, OT, RN [facility] Agency: [facility] ([facility]) Date [place]: 07/25/19 Time [place]: 37 Representative spoke with at [place], Tiffanu reviewing will call NCM back   Readmission Risk Interventions Readmission Risk Prevention Plan 07/28/2019  Post Dischage Appt Not Complete  Appt Comments plan for SNF  Medication Screening Complete  Transportation Screening Complete  Some recent data might be hidden

## 2019-07-31 NOTE — Progress Notes (Signed)
Pharmacy Antibiotic Note  Debra [name] is a 74 y.o. female admitted on 07/22/2019 with Osteomyelitis of the left calcaneous. Patient underwent partial calcaneous excision on 5/5 with Dr. Sharol Given. Patient's surgical culture from 5/5 is now growing Group B strep and Staph haemolyticus. Pharmacy has been consulted to dose ertapenem. Current SCr is 0.8 with estimated clearance of ~ 75 ml/min.    Plan: Continue ertapenem 1000mg  IV q24h until 6/16 Monitor for clinical course  Height: 5' 4.02" (162.6 cm) Weight: 104.3 kg (230 lb) IBW/kg (Calculated) : 54.74  Temp (24hrs), Avg:98.6 F (37 C), Min:98 F (36.7 C), Max:99.6 F (37.6 C)  Recent Labs  Lab 07/27/19 0138 07/28/19 0301 07/29/19 0414 07/30/19 0426 07/31/19 0418  WBC 8.9 8.6 9.1 8.0 6.9  CREATININE 0.93 0.97 0.87 0.84 0.80    Estimated Creatinine Clearance: 74.8 mL/min (by C-G formula based on SCr of 0.8 mg/dL).    Allergies  Allergen Reactions  . Banana Hives  . Hydrocodone Hives  . Neomycin-Polymyxin-Gramicidin Itching and Swelling    Eye drops caused swelling in face and rash and itching on arms and neck  . Penicillins Hives and Itching    She has tolerated keflex Has patient had a PCN reaction causing immediate rash, facial/tongue/throat swelling, SOB or lightheadedness with hypotension: Yes Has patient had a PCN reaction causing severe rash involving mucus membranes or skin necrosis: No Has patient had a PCN reaction that required hospitalization: NO Has patient had a PCN reaction occurring within the last 10 years:NO    . Aspirin Nausea Only    And causes bruises  . Latex Hives and Rash  . Metformin And Related Diarrhea  . Voltaren [Diclofenac Sodium] Nausea And Vomiting    Antimicrobials this admission: 5/6 Ertapenem >> (6/16) 5/6 Vancomycin >>5/8   Dose adjustments this admission:   Microbiology results: 5/6 surgical cx: Staph haemolyticus and group b strep  5/1 BCx: ngtd 5/1 MRSA PCR: negative    Annibelle Brazie A. Levada Dy, PharmD, BCPS, FNKF Clinical Pharmacist [place] Please utilize Amion for appropriate phone number to reach the unit pharmacist ([place])   07/31/2019 7:41 AM

## 2019-08-02 ENCOUNTER — Ambulatory Visit: Payer: Medicare Other | Admitting: Internal Medicine

## 2019-08-14 ENCOUNTER — Other Ambulatory Visit: Payer: Self-pay

## 2019-08-14 ENCOUNTER — Telehealth: Payer: Self-pay | Admitting: *Deleted

## 2019-08-14 ENCOUNTER — Encounter: Payer: Self-pay | Admitting: Infectious Disease

## 2019-08-14 ENCOUNTER — Telehealth: Payer: Self-pay

## 2019-08-14 ENCOUNTER — Ambulatory Visit: Payer: Medicare Other | Admitting: Infectious Disease

## 2019-08-14 VITALS — BP 128/77 | HR 73 | Temp 98.0°F

## 2019-08-14 DIAGNOSIS — L089 Local infection of the skin and subcutaneous tissue, unspecified: Secondary | ICD-10-CM

## 2019-08-14 DIAGNOSIS — A491 Streptococcal infection, unspecified site: Secondary | ICD-10-CM | POA: Diagnosis not present

## 2019-08-14 DIAGNOSIS — M86172 Other acute osteomyelitis, left ankle and foot: Secondary | ICD-10-CM

## 2019-08-14 DIAGNOSIS — E11628 Type 2 diabetes mellitus with other skin complications: Secondary | ICD-10-CM

## 2019-08-14 DIAGNOSIS — A498 Other bacterial infections of unspecified site: Secondary | ICD-10-CM

## 2019-08-14 NOTE — Progress Notes (Signed)
Subjective:  Chief complaint: they didn't take care of me the way they were supposed to   Patient ID: Debra [name], female    DOB: [birth year], 74 y.o.   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  She was offered curative below the knee amputation but refused.  She underwentdebridement of a left heel abscess and extensive excisional debridement of one third of her calcaneus.  Previous cultures have grown group B strep, Morganella and E. coli.  Current operative cultures are growing group B strep and staph hemolyticus S to methicillin.  He was discharged to skilled nursing facility where she was receiving once daily IV ertapenem.  She states that the nursing facility was "not treating me the way that I should been treated.  She claims that at times antibiotics were given late and sometimes not given at all.  She has decided to leave the facility today and is going home.  She is accompanied by her brother.  She believes she can do home IV antibiotics but this will need to be arranged through home health company.  She has not seen Dr. Sharol Given in follow-up.  Past Medical History:  Diagnosis Date  . Acute osteomyelitis of calcaneum, left ([place]) 07/10/2019  . Cellulitis of left leg    history of, most recent episode 01/09  . Chronic venous insufficiency   . Diabetes mellitus with neurological manifestation ([place])   . Diabetic foot infection ([place]) 07/10/2019  . Diverticulitis    h/o 2-3 episodes in past  . Group B streptococcal infection 07/10/2019  . Hyperlipidemia LDL goal < 100   . Hypertension goal BP (blood pressure) < 130/80   . Leg edema    secondary to chronic venous insufficiency  . Morbid obesity ([place])   . Osteopenia    DEXA scan 7/07  . Pseudomonas aeruginosa infection 07/10/2019    Past Surgical History:  Procedure Laterality Date  . ABDOMINAL HYSTERECTOMY    . APPENDECTOMY    .  CHOLECYSTECTOMY    . CHOLECYSTECTOMY N/A 04/14/2013   Procedure: LAPAROSCOPIC CHOLECYSTECTOMY WITH INTRAOPERATIVE CHOLANGIOGRAM;  Surgeon: Adin Hector, MD;  [facility];  Service: General;  Laterality: N/A;  . COLECTOMY     with diverting colsotmy and revision in the 1990's for diverticulitis  . ERCP N/A 04/17/2013   Procedure: ENDOSCOPIC RETROGRADE CHOLANGIOPANCREATOGRAPHY (ERCP);  Surgeon: Irene Shipper, MD;  Location: [facility];  Service: Endoscopy;  Laterality: N/A;  note pt want general anesthesia for this case.  preferto perform in endo unit, but if unavailable please arrange time in OR  . EYE SURGERY  8/13   left cataract removal & retinal repair  . I & D EXTREMITY Left 07/26/2019   Procedure: LEFT PARTIAL CALCANEOUS EXCISION;  Surgeon: [name], MD;  Location: [facility];  Service: Orthopedics;  Laterality: Left;  . INSERTION OF MESH N/A 07/30/2017   Procedure: INSERTION OF MESH;  Surgeon: Jovita Kussmaul, MD;  [facility];  Service: General;  Laterality: N/A;  . LAPAROSCOPIC LYSIS OF ADHESIONS N/A 04/14/2013   Procedure: LAPAROSCOPIC LYSIS OF ADHESIONS;  Surgeon: Adin Hector, MD;  [facility];  Service: General;  Laterality: N/A;  . LAPAROTOMY N/A 07/30/2017   Procedure: EXPLORATORY LAPAROTOMY;  Surgeon: Jovita Kussmaul, MD;  [facility];  Service: General;  Laterality: N/A;  . LYSIS OF ADHESION N/A 07/30/2017   Procedure: EXTENSIVE LYSIS OF  ADHESION;  Surgeon: Jovita Kussmaul, MD;  [facility];  Service: General;  Laterality: N/A;  . OMENTECTOMY N/A 07/30/2017   Procedure: PARTIAL OMENTECTOMY;  Surgeon: Jovita Kussmaul, MD;  [facility];  Service: General;  Laterality: N/A;  . VENTRAL HERNIA REPAIR N/A 07/30/2017   Procedure: HERNIA REPAIR VENTRAL ADULT;  Surgeon: Jovita Kussmaul, MD;  [facility];  Service: General;  Laterality: N/A;    Family History  Problem Relation Age of Onset  . Heart disease Mother   . Heart disease Father   . Heart  disease Brother   . Cancer Brother   . Cancer Brother   . Hypertension Sister   . Heart disease Sister       Social History   Socioeconomic History  . Marital status: Married    Spouse name: Not on file  . Number of children: Not on file  . Years of education: Not on file  . Highest education level: Not on file  Occupational History  . Not on file  Tobacco Use  . Smoking status: Never Smoker  . Smokeless tobacco: Never Used  Substance and Sexual Activity  . Alcohol use: No  . Drug use: No  . Sexual activity: Yes  Other Topics Concern  . Not on file  Social History Narrative   One sister with chronic progressive disease requiring a trach, and 24/7 care   One sister with an abrupt hospitalization for a critical condition   Social Determinants of Health   Financial Resource Strain:   . Difficulty of Paying Living Expenses:   Food Insecurity:   . Worried About Charity fundraiser in the Last Year:   . Arboriculturist in the Last Year:   Transportation Needs:   . Film/video editor (Medical):   Marland Kitchen Lack of Transportation (Non-Medical):   Physical Activity:   . Days of Exercise per Week:   . Minutes of Exercise per Session:   Stress:   . Feeling of Stress :   Social Connections:   . Frequency of Communication with Friends and Family:   . Frequency of Social Gatherings with Friends and Family:   . Attends Religious Services:   . Active Member of Clubs or Organizations:   . Attends Archivist Meetings:   Marland Kitchen Marital Status:     Allergies  Allergen Reactions  . Banana Hives  . Hydrocodone Hives  . Neomycin-Polymyxin-Gramicidin Itching and Swelling    Eye drops caused swelling in face and rash and itching on arms and neck  . Penicillins Hives and Itching    She has tolerated keflex Has patient had a PCN reaction causing immediate rash, facial/tongue/throat swelling, SOB or lightheadedness with hypotension: Yes Has patient had a PCN reaction causing  severe rash involving mucus membranes or skin necrosis: No Has patient had a PCN reaction that required hospitalization: NO Has patient had a PCN reaction occurring within the last 10 years:NO    . Aspirin Nausea Only    And causes bruises  . Latex Hives and Rash  . Metformin And Related Diarrhea  . Voltaren [Diclofenac Sodium] Nausea And Vomiting     Current Outpatient Medications:  .  atorvastatin (LIPITOR) 40 MG tablet, TAKE 1 TABLET BY MOUTH EVERY DAY (Patient taking differently: Take 40 mg by mouth at bedtime. ), Disp: 90 tablet, Rfl: 1 .  diphenhydrAMINE (BENADRYL) 25 MG tablet, Take 25 mg by mouth at bedtime as needed for itching., Disp: ,  Rfl:  .  ertapenem (INVANZ) IVPB, Inject 1 g into the vein daily. Indication:  Calcaneal osteomyelitis First Dose: Yes Last Day of Therapy:  09/06/19 Labs - Once weekly:  CBC/D and BMP, Labs - Every other week:  ESR and CRP Method of administration: Mini-Bag Plus / Gravity Method of administration may be changed at the discretion of home infusion pharmacist based upon assessment of the patient and/or caregiver's ability to self-administer the medication ordered., Disp: 40 Units, Rfl: 0 .  glucose blood (ACCU-CHEK AVIVA PLUS) test strip, Ell.65 check blood sugar twice daily as directed, Disp: 100 each, Rfl: 11 .  insulin aspart (NOVOLOG) 100 UNIT/ML injection, Inject 0-5 Units into the skin at bedtime., Disp: 10 mL, Rfl: 11 .  insulin aspart (NOVOLOG) 100 UNIT/ML injection, Inject 0-20 Units into the skin 3 (three) times daily with meals., Disp: 10 mL, Rfl: 11 .  insulin glargine (LANTUS) 100 UNIT/ML injection, Inject 0.3 mLs (30 Units total) into the skin at bedtime., Disp: 10 mL, Rfl: 11 .  Insulin Pen Needle (BD PEN NEEDLE NANO U/F) 32G X 4 MM MISC, Use once daily with the administration of Toujeo DX E11.65, Disp: 100 each, Rfl: 11 .  metFORMIN (GLUCOPHAGE) 500 MG tablet, Take 1 tablet (500 mg total) by mouth daily with breakfast. (Patient not taking:  Reported on 07/22/2019), Disp: , Rfl: 5 .  metoprolol succinate (TOPROL-XL) 50 MG 24 hr tablet, TAKE 1 TABLET BY MOUTH ONCE DAILY (Patient not taking: No sig reported), Disp: 90 tablet, Rfl: 1 .  naproxen sodium (ALEVE) 220 MG tablet, Take 220 mg by mouth daily as needed (pain)., Disp: , Rfl:  .  pantoprazole (PROTONIX) 40 MG tablet, Take 1 tablet (40 mg total) by mouth daily., Disp: , Rfl: 0 .  pioglitazone (ACTOS) 30 MG tablet, Take 30 mg by mouth daily., Disp: , Rfl:  .  polyethylene glycol (MIRALAX / GLYCOLAX) 17 g packet, Take 17 g by mouth daily., Disp: 14 each, Rfl: 0 .  potassium chloride SA (K-DUR,KLOR-CON) 20 MEQ tablet, TAKE 1 TABLET(20 MEQ) BY MOUTH DAILY (Patient taking differently: Take 20 mEq by mouth daily. ), Disp: 30 tablet, Rfl: 5 .  senna (SENOKOT) 8.6 MG TABS tablet, Take 1 tablet (8.6 mg total) by mouth daily., Disp: 120 tablet, Rfl: 0 .  traMADol (ULTRAM) 50 MG tablet, Take 1 tablet (50 mg total) by mouth every 8 (eight) hours as needed for severe pain (pain)., Disp: 30 tablet, Rfl: 0 .  valsartan (DIOVAN) 320 MG tablet, Take 320 mg by mouth daily., Disp: , Rfl:     Review of Systems  Constitutional: Negative for activity change, appetite change, chills, diaphoresis, fatigue, fever and unexpected weight change.  HENT: Negative for congestion, rhinorrhea, sinus pressure, sneezing, sore throat and trouble swallowing.   Eyes: Negative for photophobia and visual disturbance.  Respiratory: Negative for cough, chest tightness, shortness of breath, wheezing and stridor.   Cardiovascular: Negative for chest pain, palpitations and leg swelling.  Gastrointestinal: Negative for abdominal distention, abdominal pain, anal bleeding, blood in stool, constipation, diarrhea, nausea and vomiting.  Genitourinary: Negative for difficulty urinating, dysuria, flank pain and hematuria.  Musculoskeletal: Negative for arthralgias, back pain, gait problem, joint swelling and myalgias.  Skin:  Negative for color change, pallor, rash and wound.  Neurological: Negative for dizziness, tremors, weakness and light-headedness.  Hematological: Negative for adenopathy. Does not bruise/bleed easily.  Psychiatric/Behavioral: Positive for dysphoric mood. Negative for agitation, behavioral problems, confusion, decreased concentration and sleep disturbance.  Objective:   Physical Exam Constitutional:      General: She is not in acute distress.    Appearance: She is well-developed. She is not diaphoretic.  HENT:     Head: Normocephalic and atraumatic.     Mouth/Throat:     Pharynx: No oropharyngeal exudate.  Eyes:     General: No scleral icterus.    Conjunctiva/sclera: Conjunctivae normal.  Cardiovascular:     Rate and Rhythm: Normal rate and regular rhythm.  Pulmonary:     Effort: Pulmonary effort is normal. No respiratory distress.     Breath sounds: No wheezing.  Abdominal:     General: There is no distension.  Musculoskeletal:        General: No tenderness.     Cervical back: Normal range of motion and neck supple.  Skin:    General: Skin is warm and dry.     Coloration: Skin is not pale.     Findings: No erythema or rash.  Neurological:     General: No focal deficit present.     Mental Status: She is alert and oriented to person, place, and time.     Motor: No abnormal muscle tone.     Coordination: Coordination normal.  Psychiatric:        Attention and Perception: Attention and perception normal.        Mood and Affect: Mood is anxious.        Speech: Speech normal.        Behavior: Behavior normal.        Thought Content: Thought content normal.        Cognition and Memory: Cognition and memory normal.        Judgment: Judgment normal.    Left ankle 08/14/2019:      PICC            Assessment & Plan:   Polymicrobial calcaneal osteomyelitis: We will try to get home health to give her Colbert Ewing with plan for 6 weeks of therapy and follow-up with me  in the interim.  I will then try to combine some oral antibiotics to cover the organisms that are grown from her and try chronic suppressive antibiotics.  She is again counseled that this approach would not cure her infection and there was a risk for failure including sepsis death admission to the hospital.  She very much does not want to have a below the knee amputation at this time.  I would like her to also see Dr. Sharol Given in follow-up if possible

## 2019-08-14 NOTE — Telephone Encounter (Signed)
Patient called to see what time today nursing would be coming to her house for infusion. RN explained that the referral was sent this morning, confirmed [facility] received it and is working on it. Patient self-discharged today from nursing home (per patient, the facility is still unaware she has left).  RN let patient know that this coordination can take some time, but that the pharmacy is aware of the situation. Landis Gandy, RN

## 2019-08-14 NOTE — Patient Instructions (Signed)
I would like to extend your IV ertapenem for another month through a home health company and then try oral antibiotics

## 2019-08-14 NOTE — Telephone Encounter (Signed)
Per Dr. Tommy Medal faxed orders to [facility] to continue patient's IV antibiotic care. Patient discharged herself from [facility] this morning. Orders faxed to home health to continue IV antibiotic regimen at home. Will need home health to do wound care. Waiting on [facility] to confirm if they can take over patient's care. [place]

## 2019-08-15 ENCOUNTER — Telehealth: Payer: Self-pay | Admitting: *Deleted

## 2019-08-15 NOTE — Telephone Encounter (Signed)
Patient called to ask Dr Tommy Medal about her weight bearing status. RN returned the call, asked her to contact Dr Jess Barters office for this question and to set up follow up with him as well. Patient also could reconnect with wound care for this advice. RN gave her Dr Jess Barters phone number - [phone]. Landis Gandy, RN

## 2019-08-18 ENCOUNTER — Telehealth: Payer: Self-pay | Admitting: *Deleted

## 2019-08-18 NOTE — Telephone Encounter (Signed)
RN reminded patient that she needed to call Dr Jess Barters office to let them know 1) she left the skilled facility, 2) needs to schedule follow up, and 3) needs wound care/physical therapy guidance. Patietn states she feels too nauseated to do it.  RN will ask Dr Tommy Medal for medication for nausea, to be sent to [facility], but patient needs to call Dr Sharol Given today. Landis Gandy, RN

## 2019-08-19 ENCOUNTER — Other Ambulatory Visit: Payer: Self-pay | Admitting: Infectious Disease

## 2019-08-19 MED ORDER — ONDANSETRON HCL 4 MG PO TABS: 4.0000 mg | ORAL_TABLET | Freq: Three times a day (TID) | ORAL | 0 refills | Status: AC | PRN

## 2019-08-19 NOTE — Telephone Encounter (Signed)
Zofran sent in

## 2019-08-22 NOTE — Telephone Encounter (Signed)
Notified patient. She has contacted Dr Jess Barters office for follow up appointment.

## 2019-08-31 ENCOUNTER — Ambulatory Visit (INDEPENDENT_AMBULATORY_CARE_PROVIDER_SITE_OTHER): Payer: Medicare Other | Admitting: Orthopedic Surgery

## 2019-08-31 ENCOUNTER — Encounter: Payer: Self-pay | Admitting: Orthopedic Surgery

## 2019-08-31 VITALS — Ht 64.02 in | Wt 230.0 lb

## 2019-08-31 DIAGNOSIS — L97421 Non-pressure chronic ulcer of left heel and midfoot limited to breakdown of skin: Secondary | ICD-10-CM

## 2019-08-31 NOTE — Progress Notes (Signed)
Office Visit Note   Patient: Debra [name]           Date of Birth: Sep 21, 1945           MRN: [id number] Visit Date: 08/31/2019              Requested by: Vicenta Aly, [place],  [place] [address] PCP: Vicenta Aly, FNP  Chief Complaint  Patient presents with  . Left Foot - Routine Post Op    07/26/19 left partial calcaneous excision      HPI: Patient is a 74 year old woman who was seen in follow-up status post partial excision of the calcaneus left foot she is 4 weeks out.  She currently has a PICC line and has about a week left of IV antibiotics.  Patient states she has no pain no concerns no drainage.  Assessment & Plan: Visit Diagnoses:  1. Non-pressure chronic ulcer of left heel and midfoot limited to breakdown of skin ([place])     Plan: Sutures harvested today she may be weightbearing as tolerated the home health nursing can remove the PICC line after her last IV antibiotic.  Discussed the importance of proper nutrition and water intake.  Follow-Up Instructions: Return in about 3 weeks (around 09/21/2019).   Ortho Exam  Patient is alert, oriented, no adenopathy, well-dressed, normal affect, normal respiratory effort. Patient states she has not been eating or drinking.  Examination the incision is nicely healed there is no redness no cellulitis no drainage no signs of infection sutures harvested today.  Patient is status post partial calcaneal excision.  Imaging: No results found. No images are attached to the encounter.  Labs: Lab Results  Component Value Date   HGBA1C 8.6 (H) 07/22/2019   HGBA1C 8.9 (H) 07/27/2017   HGBA1C 7.6 (H) 05/22/2016   ESRSEDRATE 60 (H) 07/28/2019   ESRSEDRATE 22 07/10/2019   ESRSEDRATE 13 02/06/2008   CRP 10.1 (H) 07/28/2019   CRP 3.8 07/10/2019   LABURIC 5.5 11/29/2014   LABURIC 4.6 10/01/2014   LABURIC 5.9 06/09/2013   REPTSTATUS 07/30/2019 FINAL 07/26/2019   GRAMSTAIN  07/26/2019    ABUNDANT  WBC PRESENT,BOTH PMN AND MONONUCLEAR ABUNDANT GRAM POSITIVE COCCI FEW GRAM NEGATIVE RODS    CULT  07/26/2019    ABUNDANT GROUP B STREP(S.AGALACTIAE)ISOLATED TESTING AGAINST S. AGALACTIAE NOT ROUTINELY PERFORMED DUE TO PREDICTABILITY OF AMP/PEN/VAN SUSCEPTIBILITY. FEW STAPHYLOCOCCUS HAEMOLYTICUS ABUNDANT BACTEROIDES FRAGILIS BETA LACTAMASE POSITIVE Performed at [facility], [place] [address], [place], [place] [address]    LABORGA STAPHYLOCOCCUS HAEMOLYTICUS 07/26/2019     Lab Results  Component Value Date   ALBUMIN 3.2 (L) 07/22/2019   ALBUMIN 2.4 (L) 08/02/2017   ALBUMIN 3.1 (L) 07/28/2017   LABURIC 5.5 11/29/2014   LABURIC 4.6 10/01/2014   LABURIC 5.9 06/09/2013    Lab Results  Component Value Date   MG 1.2 (L) 08/02/2017   MG 1.6 (L) 07/29/2017   MG 1.3 (L) 07/27/2017   Lab Results  Component Value Date   VD25OH 28.7 (L) 11/28/2012    No results found for: PREALBUMIN CBC EXTENDED Latest Ref Rng & Units 07/31/2019 07/30/2019 07/29/2019  WBC 4.0 - 10.5 K/uL 6.9 8.0 9.1  RBC 3.87 - 5.11 MIL/uL 3.59(L) 3.58(L) 3.57(L)  HGB 12.0 - 15.0 g/dL 10.6(L) 10.3(L) 10.5(L)  HCT 36 - 46 % 33.1(L) 33.0(L) 32.9(L)  PLT 150 - 400 K/uL 298 296 303  NEUTROABS 1.7 - 7.7 K/uL - - -  LYMPHSABS 0.7 -  4.0 K/uL - - -     Body mass index is 39.45 kg/m.  Orders:  No orders of the defined types were placed in this encounter.  No orders of the defined types were placed in this encounter.    Procedures: No procedures performed  Clinical Data: No additional findings.  ROS:  All other systems negative, except as noted in the HPI. Review of Systems  Objective: Vital Signs: Ht 5' 4.02" (1.626 m)   Wt 230 lb (104.3 kg)   LMP 07/02/1973   BMI 39.45 kg/m   Specialty Comments:  No specialty comments available.  PMFS History: Patient Active Problem List   Diagnosis Date Noted  . Cutaneous abscess of left foot   . Osteomyelitis ([place]) 07/22/2019  . Diabetic foot infection  ([place]) 07/10/2019  . Acute osteomyelitis of left calcaneus ([place]) 07/10/2019  . Group B streptococcal infection 07/10/2019  . 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 Pseudogout of right knee 08/02/2017  . SBO (small bowel obstruction) ([place]) 07/22/2017  . Abnormal LFTs 03/20/2016  . Abdominal pain, left lower quadrant 07/04/2015  . Type 2 diabetes mellitus with diabetic polyneuropathy ([place]) 10/06/2013  . Hyperlipidemia associated with type 2 diabetes mellitus ([place]) 10/06/2013  . Fungal dermatitis 08/11/2013  . Hyperlipidemia 06/09/2013  . Choledocholithiasis with acute cholecystitis 04/14/2013  . Symptomatic cholelithiasis 04/13/2013  . Neuropathic pain of both legs 04/04/2013  . Severe obesity (BMI >= 40) ([place]) 11/28/2012  . Obesity (BMI 30-39.9) 11/28/2012  . Diabetes mellitus with neurological manifestation ([place])   . Hypertension goal BP (blood pressure) < 130/80   . Osteopenia   . Chronic venous insufficiency   . Leg edema   . Hypertensive retinopathy, grade 2 03/30/2012  . Medial epicondylitis 02/11/2012  . Superficial thrombophlebitis of left leg 07/15/2011  . GERD (gastroesophageal reflux disease) 02/25/2011  . Right knee pain 02/16/2011  . Preventive measure 09/11/2010  . Neck pain 07/03/2010  . Grieving 04/17/2010  . OSTEOPENIA 04/12/2006  . Controlled type 2 diabetes mellitus with complication, with long-term current use of insulin ([place]) 03/08/2006  . MORBID OBESITY 03/08/2006  . Essential hypertension 03/08/2006   Past Medical History:  Diagnosis Date  . Acute osteomyelitis of calcaneum, left ([place]) 07/10/2019  . Cellulitis of left leg    history of, most recent episode 01/09  . Chronic venous insufficiency   . Diabetes mellitus with neurological manifestation ([place])   . Diabetic foot infection ([place]) 07/10/2019  . Diverticulitis    h/o 2-3 episodes in past  . Group B streptococcal infection 07/10/2019  . Hyperlipidemia LDL goal < 100   .  Hypertension goal BP (blood pressure) < 130/80   . Leg edema    secondary to chronic venous insufficiency  . Morbid obesity ([place])   . Osteopenia    DEXA scan 7/07  . Pseudomonas aeruginosa infection 07/10/2019    Family History  Problem Relation Age of Onset  . Heart disease Mother   . Heart disease Father   . Heart disease Brother   . Cancer Brother   . Cancer Brother   . Hypertension Sister   . Heart disease Sister     Past Surgical History:  Procedure Laterality Date  . ABDOMINAL HYSTERECTOMY    . APPENDECTOMY    . CHOLECYSTECTOMY    . CHOLECYSTECTOMY N/A 04/14/2013   Procedure: LAPAROSCOPIC CHOLECYSTECTOMY WITH INTRAOPERATIVE CHOLANGIOGRAM;  Surgeon: Adin Hector, MD;  [facility];  Service: General;  Laterality: N/A;  . COLECTOMY  with diverting colsotmy and revision in the 1990's for diverticulitis  . ERCP N/A 04/17/2013   Procedure: ENDOSCOPIC RETROGRADE CHOLANGIOPANCREATOGRAPHY (ERCP);  Surgeon: Irene Shipper, MD;  Location: [facility];  Service: Endoscopy;  Laterality: N/A;  note pt want general anesthesia for this case.  preferto perform in endo unit, but if unavailable please arrange time in OR  . EYE SURGERY  8/13   left cataract removal & retinal repair  . I & D EXTREMITY Left 07/26/2019   Procedure: LEFT PARTIAL CALCANEOUS EXCISION;  Surgeon: [name], MD;  Location: [facility];  Service: Orthopedics;  Laterality: Left;  . INSERTION OF MESH N/A 07/30/2017   Procedure: INSERTION OF MESH;  Surgeon: Jovita Kussmaul, MD;  [facility];  Service: General;  Laterality: N/A;  . LAPAROSCOPIC LYSIS OF ADHESIONS N/A 04/14/2013   Procedure: LAPAROSCOPIC LYSIS OF ADHESIONS;  Surgeon: Adin Hector, MD;  [facility];  Service: General;  Laterality: N/A;  . LAPAROTOMY N/A 07/30/2017   Procedure: EXPLORATORY LAPAROTOMY;  Surgeon: Jovita Kussmaul, MD;  [facility];  Service: General;  Laterality: N/A;  . LYSIS OF ADHESION N/A 07/30/2017   Procedure: EXTENSIVE  LYSIS OF ADHESION;  Surgeon: Jovita Kussmaul, MD;  [facility];  Service: General;  Laterality: N/A;  . OMENTECTOMY N/A 07/30/2017   Procedure: PARTIAL OMENTECTOMY;  Surgeon: Jovita Kussmaul, MD;  [facility];  Service: General;  Laterality: N/A;  . VENTRAL HERNIA REPAIR N/A 07/30/2017   Procedure: HERNIA REPAIR VENTRAL ADULT;  Surgeon: Jovita Kussmaul, MD;  [facility];  Service: General;  Laterality: N/A;   Social History   Occupational History  . Not on file  Tobacco Use  . Smoking status: Never Smoker  . Smokeless tobacco: Never Used  Vaping Use  . Vaping Use: Never used  Substance and Sexual Activity  . Alcohol use: No  . Drug use: No  . Sexual activity: Yes

## 2019-09-01 ENCOUNTER — Telehealth: Payer: Self-pay

## 2019-09-01 NOTE — Telephone Encounter (Signed)
[facility]ed [facility] to [facility] orders for antibiotics through 7/6. Home health will send supplies to patient.  Called patient to clarify correct end date for antibiotics. Informed her that 7/6 is current end date to make sure she is fully treated since having gap in treatment. Patient verbalized understanding and does not have any questions. Debra [name]

## 2019-09-01 NOTE — Telephone Encounter (Signed)
Debbie with [facility] called office to confirm IV end date for patient. States patient told home health that she will not need any refills on her antibiotics and could have picc pulled after her last dose on Tuesday per Dr. Sharol Given. [facility] has order for Iv antibiotics to end in July. Will forward message to advise on end date. [place]

## 2019-09-01 NOTE — Telephone Encounter (Signed)
I thought we had reset clock because she had inconsistent abx in the SNF is that not reflected in my note?

## 2019-09-05 ENCOUNTER — Encounter: Payer: Self-pay | Admitting: Infectious Disease

## 2019-09-14 ENCOUNTER — Ambulatory Visit (INDEPENDENT_AMBULATORY_CARE_PROVIDER_SITE_OTHER): Payer: Medicare Other | Admitting: Infectious Disease

## 2019-09-14 ENCOUNTER — Other Ambulatory Visit: Payer: Self-pay

## 2019-09-14 ENCOUNTER — Encounter: Payer: Self-pay | Admitting: Infectious Disease

## 2019-09-14 VITALS — BP 120/72 | HR 71 | Temp 98.1°F | Ht 64.0 in | Wt 231.0 lb

## 2019-09-14 DIAGNOSIS — E11628 Type 2 diabetes mellitus with other skin complications: Secondary | ICD-10-CM

## 2019-09-14 DIAGNOSIS — B369 Superficial mycosis, unspecified: Secondary | ICD-10-CM | POA: Diagnosis not present

## 2019-09-14 DIAGNOSIS — A491 Streptococcal infection, unspecified site: Secondary | ICD-10-CM

## 2019-09-14 DIAGNOSIS — A498 Other bacterial infections of unspecified site: Secondary | ICD-10-CM

## 2019-09-14 DIAGNOSIS — E0840 Diabetes mellitus due to underlying condition with diabetic neuropathy, unspecified: Secondary | ICD-10-CM

## 2019-09-14 DIAGNOSIS — M86172 Other acute osteomyelitis, left ankle and foot: Secondary | ICD-10-CM

## 2019-09-14 DIAGNOSIS — L089 Local infection of the skin and subcutaneous tissue, unspecified: Secondary | ICD-10-CM

## 2019-09-14 MED ORDER — SULFAMETHOXAZOLE-TRIMETHOPRIM 800-160 MG PO TABS: 1.0000 | ORAL_TABLET | Freq: Two times a day (BID) | ORAL | 11 refills | Status: DC

## 2019-09-14 MED ORDER — CEPHALEXIN 500 MG PO CAPS: 500.0000 mg | ORAL_CAPSULE | Freq: Four times a day (QID) | ORAL | 11 refills | Status: DC

## 2019-09-14 NOTE — Progress Notes (Signed)
Subjective:  Chief complaint:  followup for osteomyelitis  Patient ID: Debra [name], female    DOB: Jul 22, 1945, 74 y.o.   MRN: [id number]  HPI   74 year old Caucasian female with diabetes mellitus and diabetic foot ulcer with osteomyelitis of the calcaneus.  She was offered curative below the knee amputation but refused.  She underwentdebridement of a left heel abscess and extensive excisional debridement of one third of her calcaneus.  Previous cultures have grown group B strep, Morganella and E. coli.  Current operative cultures are growing group B strep and staph hemolyticus S to methicillin.  He was discharged to skilled nursing facility where she was receiving once daily IV ertapenem.  She states that the nursing facility was "not treating me the way that I should been treated.  She claims that at times antibiotics were given late and sometimes not given at all.  She has decided to leave the facility today and is going home.   She believed she can do home IV antibiotics but this will need to be arranged through home health company.  We set her up with home IV antibiotics and she has continued on these and followed up with Dr. Sharol Given. She has some pruritis underneath her PICC line but not much inflammation is present on exam.  Past Medical History:  Diagnosis Date  . Acute osteomyelitis of calcaneum, left ([place]) 07/10/2019  . Cellulitis of left leg    history of, most recent episode 01/09  . Chronic venous insufficiency   . Diabetes mellitus with neurological manifestation ([place])   . Diabetic foot infection ([place]) 07/10/2019  . Diverticulitis    h/o 2-3 episodes in past  . Group B streptococcal infection 07/10/2019  . Hyperlipidemia LDL goal < 100   . Hypertension goal BP (blood pressure) < 130/80   . Leg edema    secondary to chronic venous insufficiency  . Morbid obesity ([place])   . Osteopenia    DEXA scan 7/07  . Pseudomonas aeruginosa infection 07/10/2019    Past Surgical History:    Procedure Laterality Date  . ABDOMINAL HYSTERECTOMY    . APPENDECTOMY    . CHOLECYSTECTOMY    . CHOLECYSTECTOMY N/A 04/14/2013   Procedure: LAPAROSCOPIC CHOLECYSTECTOMY WITH INTRAOPERATIVE CHOLANGIOGRAM;  Surgeon: Adin Hector, MD;  [facility];  Service: General;  Laterality: N/A;  . COLECTOMY     with diverting colsotmy and revision in the 1990's for diverticulitis  . ERCP N/A 04/17/2013   Procedure: ENDOSCOPIC RETROGRADE CHOLANGIOPANCREATOGRAPHY (ERCP);  Surgeon: Irene Shipper, MD;  Location: [facility];  Service: Endoscopy;  Laterality: N/A;  note pt want general anesthesia for this case.  preferto perform in endo unit, but if unavailable please arrange time in OR  . EYE SURGERY  8/13   left cataract removal & retinal repair  . I & D EXTREMITY Left 07/26/2019   Procedure: LEFT PARTIAL CALCANEOUS EXCISION;  Surgeon: [name], MD;  Location: [facility];  Service: Orthopedics;  Laterality: Left;  . INSERTION OF MESH N/A 07/30/2017   Procedure: INSERTION OF MESH;  Surgeon: Jovita Kussmaul, MD;  [facility];  Service: General;  Laterality: N/A;  . LAPAROSCOPIC LYSIS OF ADHESIONS N/A 04/14/2013   Procedure: LAPAROSCOPIC LYSIS OF ADHESIONS;  Surgeon: Adin Hector, MD;  [facility];  Service: General;  Laterality: N/A;  . LAPAROTOMY N/A 07/30/2017   Procedure: EXPLORATORY LAPAROTOMY;  Surgeon: Jovita Kussmaul, MD;  [facility];  Service: General;  Laterality:  N/A;  . LYSIS OF ADHESION N/A 07/30/2017   Procedure: EXTENSIVE LYSIS OF ADHESION;  Surgeon: Jovita Kussmaul, MD;  [facility];  Service: General;  Laterality: N/A;  . OMENTECTOMY N/A 07/30/2017   Procedure: PARTIAL OMENTECTOMY;  Surgeon: Jovita Kussmaul, MD;  [facility];  Service: General;  Laterality: N/A;  . VENTRAL HERNIA REPAIR N/A 07/30/2017   Procedure: HERNIA REPAIR VENTRAL ADULT;  Surgeon: Jovita Kussmaul, MD;  [facility];  Service: General;  Laterality: N/A;    Family History  Problem Relation  Age of Onset  . Heart disease Mother   . Heart disease Father   . Heart disease Brother   . Cancer Brother   . Cancer Brother   . Hypertension Sister   . Heart disease Sister       Social History   Socioeconomic History  . Marital status: Married    Spouse name: Not on file  . Number of children: Not on file  . Years of education: Not on file  . Highest education level: Not on file  Occupational History  . Not on file  Tobacco Use  . Smoking status: Never Smoker  . Smokeless tobacco: Never Used  Vaping Use  . Vaping Use: Never used  Substance and Sexual Activity  . Alcohol use: No  . Drug use: No  . Sexual activity: Yes  Other Topics Concern  . Not on file  Social History Narrative   One sister with chronic progressive disease requiring a trach, and 24/7 care   One sister with an abrupt hospitalization for a critical condition   Social Determinants of Health   Financial Resource Strain:   . Difficulty of Paying Living Expenses:   Food Insecurity:   . Worried About Charity fundraiser in the Last Year:   . Arboriculturist in the Last Year:   Transportation Needs:   . Film/video editor (Medical):   Marland Kitchen Lack of Transportation (Non-Medical):   Physical Activity:   . Days of Exercise per Week:   . Minutes of Exercise per Session:   Stress:   . Feeling of Stress :   Social Connections:   . Frequency of Communication with Friends and Family:   . Frequency of Social Gatherings with Friends and Family:   . Attends Religious Services:   . Active Member of Clubs or Organizations:   . Attends Archivist Meetings:   Marland Kitchen Marital Status:     Allergies  Allergen Reactions  . Banana Hives  . Hydrocodone Hives  . Neomycin-Polymyxin-Gramicidin Itching and Swelling    Eye drops caused swelling in face and rash and itching on arms and neck  . Penicillins Hives and Itching    She has tolerated keflex Has patient had a PCN reaction causing immediate rash,  facial/tongue/throat swelling, SOB or lightheadedness with hypotension: Yes Has patient had a PCN reaction causing severe rash involving mucus membranes or skin necrosis: No Has patient had a PCN reaction that required hospitalization: NO Has patient had a PCN reaction occurring within the last 10 years:NO    . Aspirin Nausea Only    And causes bruises  . Latex Hives and Rash  . Metformin And Related Diarrhea  . Voltaren [Diclofenac Sodium] Nausea And Vomiting     Current Outpatient Medications:  .  atorvastatin (LIPITOR) 40 MG tablet, TAKE 1 TABLET BY MOUTH EVERY DAY (Patient taking differently: Take 40 mg by mouth at bedtime. ), Disp:  90 tablet, Rfl: 1 .  diphenhydrAMINE (BENADRYL) 25 MG tablet, Take 25 mg by mouth at bedtime as needed for itching., Disp: , Rfl:  .  ertapenem (INVANZ) 1 g injection, , Disp: , Rfl:  .  glucose blood (ACCU-CHEK AVIVA PLUS) test strip, Ell.65 check blood sugar twice daily as directed, Disp: 100 each, Rfl: 11 .  Insulin Pen Needle (BD PEN NEEDLE NANO U/F) 32G X 4 MM MISC, Use once daily with the administration of Toujeo DX E11.65, Disp: 100 each, Rfl: 11 .  insulin regular (NOVOLIN R) 100 units/mL injection, Inject into the skin 3 (three) times daily before meals. As directed, Disp: , Rfl:  .  metoprolol succinate (TOPROL-XL) 50 MG 24 hr tablet, TAKE 1 TABLET BY MOUTH ONCE DAILY, Disp: 90 tablet, Rfl: 1 .  ondansetron (ZOFRAN) 4 MG tablet, Take 1 tablet (4 mg total) by mouth every 8 (eight) hours as needed for nausea or vomiting., Disp: 60 tablet, Rfl: 0 .  pioglitazone (ACTOS) 30 MG tablet, Take 30 mg by mouth daily., Disp: , Rfl:  .  potassium chloride SA (K-DUR,KLOR-CON) 20 MEQ tablet, TAKE 1 TABLET(20 MEQ) BY MOUTH DAILY (Patient taking differently: Take 20 mEq by mouth daily. ), Disp: 30 tablet, Rfl: 5 .  traMADol (ULTRAM) 50 MG tablet, Take 1 tablet (50 mg total) by mouth every 8 (eight) hours as needed for severe pain (pain)., Disp: 30 tablet, Rfl: 0 .   valsartan (DIOVAN) 320 MG tablet, Take 320 mg by mouth daily., Disp: , Rfl:  .  insulin aspart (NOVOLOG) 100 UNIT/ML injection, Inject 0-5 Units into the skin at bedtime., Disp: 10 mL, Rfl: 11 .  insulin aspart (NOVOLOG) 100 UNIT/ML injection, Inject 0-20 Units into the skin 3 (three) times daily with meals., Disp: 10 mL, Rfl: 11 .  insulin glargine (LANTUS) 100 UNIT/ML injection, Inject 0.3 mLs (30 Units total) into the skin at bedtime., Disp: 10 mL, Rfl: 11 .  metFORMIN (GLUCOPHAGE) 500 MG tablet, Take 1 tablet (500 mg total) by mouth daily with breakfast. (Patient not taking: Reported on 09/14/2019), Disp: , Rfl: 5 .  naproxen sodium (ALEVE) 220 MG tablet, Take 220 mg by mouth daily as needed (pain). (Patient not taking: Reported on 09/14/2019), Disp: , Rfl:  .  omeprazole (PRILOSEC) 40 MG capsule, , Disp: , Rfl:  .  pantoprazole (PROTONIX) 40 MG tablet, Take 1 tablet (40 mg total) by mouth daily., Disp: , Rfl: 0 .  polyethylene glycol (MIRALAX / GLYCOLAX) 17 g packet, Take 17 g by mouth daily. (Patient not taking: Reported on 09/14/2019), Disp: 14 each, Rfl: 0 .  senna (SENOKOT) 8.6 MG TABS tablet, Take 1 tablet (8.6 mg total) by mouth daily. (Patient not taking: Reported on 09/14/2019), Disp: 120 tablet, Rfl: 0    Review of Systems  Constitutional: Negative for activity change, appetite change, chills, diaphoresis, fatigue, fever and unexpected weight change.  HENT: Negative for congestion, rhinorrhea, sinus pressure, sneezing, sore throat and trouble swallowing.   Eyes: Negative for photophobia and visual disturbance.  Respiratory: Negative for cough, chest tightness, shortness of breath, wheezing and stridor.   Cardiovascular: Negative for chest pain, palpitations and leg swelling.  Gastrointestinal: Negative for abdominal distention, abdominal pain, anal bleeding, blood in stool, constipation, diarrhea, nausea and vomiting.  Genitourinary: Negative for difficulty urinating, dysuria, flank  pain and hematuria.  Musculoskeletal: Negative for arthralgias, back pain, gait problem, joint swelling and myalgias.  Skin: Negative for color change, pallor, rash and wound.  Neurological: Negative  for dizziness, tremors, weakness and light-headedness.  Hematological: Negative for adenopathy. Does not bruise/bleed easily.  Psychiatric/Behavioral: Positive for dysphoric mood. Negative for agitation, behavioral problems, confusion, decreased concentration and sleep disturbance.       Objective:   Physical Exam Constitutional:      General: She is not in acute distress.    Appearance: She is well-developed. She is not diaphoretic.  HENT:     Head: Normocephalic and atraumatic.     Mouth/Throat:     Pharynx: No oropharyngeal exudate.  Eyes:     General: No scleral icterus.    Conjunctiva/sclera: Conjunctivae normal.  Cardiovascular:     Rate and Rhythm: Normal rate and regular rhythm.  Pulmonary:     Effort: Pulmonary effort is normal. No respiratory distress.     Breath sounds: No wheezing.  Abdominal:     General: There is no distension.  Musculoskeletal:        General: No tenderness.     Cervical back: Normal range of motion and neck supple.  Skin:    General: Skin is warm and dry.     Coloration: Skin is not pale.     Findings: No erythema or rash.  Neurological:     General: No focal deficit present.     Mental Status: She is alert and oriented to person, place, and time.     Motor: No abnormal muscle tone.     Coordination: Coordination normal.  Psychiatric:        Attention and Perception: Attention and perception normal.        Mood and Affect: Mood is anxious.        Speech: Speech normal.        Behavior: Behavior normal.        Thought Content: Thought content normal.        Cognition and Memory: Cognition and memory normal.        Judgment: Judgment normal.    Left ankle 08/14/2019:    Left foot 09/14/2019:        PICC      09/14/2019:           Assessment & Plan:   Polymicrobial calcaneal osteomyelitis: she is to complete IV invanz in early July  I will switch her to oral keflex 500 mg qid and bactrim DS bid  We will continue this for protracted if not indefinite period of time

## 2019-09-15 ENCOUNTER — Telehealth: Payer: Self-pay | Admitting: *Deleted

## 2019-09-15 NOTE — Telephone Encounter (Signed)
Relayed Dr Lucianne Lei Dam's verbal order for end of treatment 09/26/19, ok to pull PICC 09/27/19 to [place] at [facility]. Landis Gandy, RN

## 2019-09-26 ENCOUNTER — Other Ambulatory Visit: Payer: Self-pay

## 2019-09-26 ENCOUNTER — Ambulatory Visit (INDEPENDENT_AMBULATORY_CARE_PROVIDER_SITE_OTHER): Payer: Medicare Other | Admitting: Orthopedic Surgery

## 2019-09-26 ENCOUNTER — Encounter: Payer: Self-pay | Admitting: Orthopedic Surgery

## 2019-09-26 VITALS — Ht 64.0 in | Wt 231.0 lb

## 2019-09-26 DIAGNOSIS — M86172 Other acute osteomyelitis, left ankle and foot: Secondary | ICD-10-CM

## 2019-09-26 NOTE — Progress Notes (Signed)
Office Visit Note   Patient: Debra [name]           Date of Birth: Aug 16, 1945           MRN: [id number] Visit Date: 09/26/2019              Requested by: Vicenta Aly, [address] [place],  [place] [id number] PCP: Vicenta Aly, FNP  Chief Complaint  Patient presents with  . Left Foot - Follow-up    07/26/19 left partial calcaneous excision        HPI: Patient is a 74 year old woman who was seen in follow-up status post partial excision of the calcaneus left foot she is 8 weeks out.  PICC pulled earlier today. Is on oral abx, following with Wendie Agreste.    Patient states she has no pain no concerns no drainage. Walking in regular shoe wear.  Assessment & Plan: Visit Diagnoses:  1. Acute osteomyelitis of left calcaneus (HCC)     Plan: declined ED shoes and cusom orthotics. She may be weightbearing as tolerated. Continue compression garments to bilateral LEs.   Follow-Up Instructions: Return in about 4 weeks (around 10/24/2019).   Ortho Exam  Patient is alert, oriented, no adenopathy, well-dressed, normal affect, normal respiratory effort. Examination the plantar heel incision is nicely healed there is no redness no cellulitis no drainage no signs of infection. Mild tenderness.   Imaging: No results found. No images are attached to the encounter.  Labs: Lab Results  Component Value Date   HGBA1C 8.6 (H) 07/22/2019   HGBA1C 8.9 (H) 07/27/2017   HGBA1C 7.6 (H) 05/22/2016   ESRSEDRATE 60 (H) 07/28/2019   ESRSEDRATE 22 07/10/2019   ESRSEDRATE 13 02/06/2008   CRP 10.1 (H) 07/28/2019   CRP 3.8 07/10/2019   LABURIC 5.5 11/29/2014   LABURIC 4.6 10/01/2014   LABURIC 5.9 06/09/2013   REPTSTATUS 07/30/2019 FINAL 07/26/2019   GRAMSTAIN  07/26/2019    ABUNDANT WBC PRESENT,BOTH PMN AND MONONUCLEAR ABUNDANT GRAM POSITIVE COCCI FEW GRAM NEGATIVE RODS    CULT  07/26/2019    ABUNDANT GROUP B STREP(S.AGALACTIAE)ISOLATED TESTING AGAINST S. AGALACTIAE  NOT ROUTINELY PERFORMED DUE TO PREDICTABILITY OF AMP/PEN/VAN SUSCEPTIBILITY. FEW STAPHYLOCOCCUS HAEMOLYTICUS ABUNDANT BACTEROIDES FRAGILIS BETA LACTAMASE POSITIVE Performed at [facility], [place] [address] [place], [place] [address]    LABORGA STAPHYLOCOCCUS HAEMOLYTICUS 07/26/2019     Lab Results  Component Value Date   ALBUMIN 3.2 (L) 07/22/2019   ALBUMIN 2.4 (L) 08/02/2017   ALBUMIN 3.1 (L) 07/28/2017   LABURIC 5.5 11/29/2014   LABURIC 4.6 10/01/2014   LABURIC 5.9 06/09/2013    Lab Results  Component Value Date   MG 1.2 (L) 08/02/2017   MG 1.6 (L) 07/29/2017   MG 1.3 (L) 07/27/2017   Lab Results  Component Value Date   VD25OH 28.7 (L) 11/28/2012    No results found for: PREALBUMIN CBC EXTENDED Latest Ref Rng & Units 07/31/2019 07/30/2019 07/29/2019  WBC 4.0 - 10.5 K/uL 6.9 8.0 9.1  RBC 3.87 - 5.11 MIL/uL 3.59(L) 3.58(L) 3.57(L)  HGB 12.0 - 15.0 g/dL 10.6(L) 10.3(L) 10.5(L)  HCT 36 - 46 % 33.1(L) 33.0(L) 32.9(L)  PLT 150 - 400 K/uL 298 296 303  NEUTROABS 1.7 - 7.7 K/uL - - -  LYMPHSABS 0.7 - 4.0 K/uL - - -     Body mass index is 39.65 kg/m.  Orders:  No orders of the defined types were placed in this encounter.  No orders of  the defined types were placed in this encounter.    Procedures: No procedures performed  Clinical Data: No additional findings.  ROS:  All other systems negative, except as noted in the HPI. Review of Systems  Constitutional: Negative for chills and fever.  Cardiovascular: Positive for leg swelling.  Skin: Negative for color change and wound.    Objective: Vital Signs: Ht 5\' 4"  (1.626 m)   Wt 231 lb (104.8 kg)   LMP 07/02/1973   BMI 39.65 kg/m   Specialty Comments:  No specialty comments available.  PMFS History: Patient Active Problem List   Diagnosis Date Noted  . Cutaneous abscess of left foot   . Osteomyelitis ([place]) 07/22/2019  . Diabetic foot infection ([place]) 07/10/2019  . Acute osteomyelitis of left  calcaneus ([place]) 07/10/2019  . Group B streptococcal infection 07/10/2019  . 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 Pseudogout of right knee 08/02/2017  . SBO (small bowel obstruction) ([place]) 07/22/2017  . Abnormal LFTs 03/20/2016  . Abdominal pain, left lower quadrant 07/04/2015  . Type 2 diabetes mellitus with diabetic polyneuropathy ([place]) 10/06/2013  . Hyperlipidemia associated with type 2 diabetes mellitus ([place]) 10/06/2013  . Fungal dermatitis 08/11/2013  . Hyperlipidemia 06/09/2013  . Choledocholithiasis with acute cholecystitis 04/14/2013  . Symptomatic cholelithiasis 04/13/2013  . Neuropathic pain of both legs 04/04/2013  . Severe obesity (BMI >= 40) ([place]) 11/28/2012  . Obesity (BMI 30-39.9) 11/28/2012  . Diabetes mellitus with neurological manifestation ([place])   . Hypertension goal BP (blood pressure) < 130/80   . Osteopenia   . Chronic venous insufficiency   . Leg edema   . Hypertensive retinopathy, grade 2 03/30/2012  . Medial epicondylitis 02/11/2012  . Superficial thrombophlebitis of left leg 07/15/2011  . GERD (gastroesophageal reflux disease) 02/25/2011  . Right knee pain 02/16/2011  . Preventive measure 09/11/2010  . Neck pain 07/03/2010  . Grieving 04/17/2010  . OSTEOPENIA 04/12/2006  . Controlled type 2 diabetes mellitus with complication, with long-term current use of insulin ([place]) 03/08/2006  . MORBID OBESITY 03/08/2006  . Essential hypertension 03/08/2006   Past Medical History:  Diagnosis Date  . Acute osteomyelitis of calcaneum, left ([place]) 07/10/2019  . Cellulitis of left leg    history of, most recent episode 01/09  . Chronic venous insufficiency   . Diabetes mellitus with neurological manifestation ([place])   . Diabetic foot infection ([place]) 07/10/2019  . Diverticulitis    h/o 2-3 episodes in past  . Group B streptococcal infection 07/10/2019  . Hyperlipidemia LDL goal < 100   . Hypertension goal BP (blood pressure) < 130/80   . Leg  edema    secondary to chronic venous insufficiency  . Morbid obesity ([place])   . Osteopenia    DEXA scan 7/07  . Pseudomonas aeruginosa infection 07/10/2019    Family History  Problem Relation Age of Onset  . Heart disease Mother   . Heart disease Father   . Heart disease Brother   . Cancer Brother   . Cancer Brother   . Hypertension Sister   . Heart disease Sister     Past Surgical History:  Procedure Laterality Date  . ABDOMINAL HYSTERECTOMY    . APPENDECTOMY    . CHOLECYSTECTOMY    . CHOLECYSTECTOMY N/A 04/14/2013   Procedure: LAPAROSCOPIC CHOLECYSTECTOMY WITH INTRAOPERATIVE CHOLANGIOGRAM;  Surgeon: Adin Hector, MD;  [facility];  Service: General;  Laterality: N/A;  . COLECTOMY     with diverting colsotmy and revision in  the 1990's for diverticulitis  . ERCP N/A 04/17/2013   Procedure: ENDOSCOPIC RETROGRADE CHOLANGIOPANCREATOGRAPHY (ERCP);  Surgeon: Irene Shipper, MD;  Location: [facility];  Service: Endoscopy;  Laterality: N/A;  note pt want general anesthesia for this case.  preferto perform in endo unit, but if unavailable please arrange time in OR  . EYE SURGERY  8/13   left cataract removal & retinal repair  . I & D EXTREMITY Left 07/26/2019   Procedure: LEFT PARTIAL CALCANEOUS EXCISION;  Surgeon: [name], MD;  Location: [facility];  Service: Orthopedics;  Laterality: Left;  . INSERTION OF MESH N/A 07/30/2017   Procedure: INSERTION OF MESH;  Surgeon: Jovita Kussmaul, MD;  [facility];  Service: General;  Laterality: N/A;  . LAPAROSCOPIC LYSIS OF ADHESIONS N/A 04/14/2013   Procedure: LAPAROSCOPIC LYSIS OF ADHESIONS;  Surgeon: Adin Hector, MD;  [facility];  Service: General;  Laterality: N/A;  . LAPAROTOMY N/A 07/30/2017   Procedure: EXPLORATORY LAPAROTOMY;  Surgeon: Jovita Kussmaul, MD;  [facility];  Service: General;  Laterality: N/A;  . LYSIS OF ADHESION N/A 07/30/2017   Procedure: EXTENSIVE LYSIS OF ADHESION;  Surgeon: Jovita Kussmaul, MD;   [facility];  Service: General;  Laterality: N/A;  . OMENTECTOMY N/A 07/30/2017   Procedure: PARTIAL OMENTECTOMY;  Surgeon: Jovita Kussmaul, MD;  [facility];  Service: General;  Laterality: N/A;  . VENTRAL HERNIA REPAIR N/A 07/30/2017   Procedure: HERNIA REPAIR VENTRAL ADULT;  Surgeon: Jovita Kussmaul, MD;  [facility];  Service: General;  Laterality: N/A;   Social History   Occupational History  . Not on file  Tobacco Use  . Smoking status: Never Smoker  . Smokeless tobacco: Never Used  Vaping Use  . Vaping Use: Never used  Substance and Sexual Activity  . Alcohol use: No  . Drug use: No  . Sexual activity: Yes

## 2019-10-24 ENCOUNTER — Ambulatory Visit: Payer: Medicare Other | Admitting: Orthopedic Surgery

## 2019-10-30 ENCOUNTER — Ambulatory Visit (INDEPENDENT_AMBULATORY_CARE_PROVIDER_SITE_OTHER): Payer: Medicare Other | Admitting: Physician Assistant

## 2019-10-30 ENCOUNTER — Encounter: Payer: Self-pay | Admitting: Physician Assistant

## 2019-10-30 VITALS — Ht 64.0 in | Wt 231.0 lb

## 2019-10-30 DIAGNOSIS — M86172 Other acute osteomyelitis, left ankle and foot: Secondary | ICD-10-CM

## 2019-10-30 NOTE — Progress Notes (Signed)
Office Visit Note   Patient: Debra [name]           Date of Birth: January 22, 1946           MRN: [id number] Visit Date: 10/30/2019              Requested by: Vicenta Aly, [address] [place],  [place] [address] PCP: Vicenta Aly, FNP  Chief Complaint  Patient presents with  . Left Foot - Routine Post Op    07/26/19 left partial calcaneous excision        HPI: This is a pleasant 74 year old woman who is status post left partial excision calcaneus.  She is weightbearing as tolerated.  She is wearing her compression stockings.  She overall feels okay.  She does feel less pain in her heel than at her prior visit.  She does have some left knee pain  Assessment & Plan: Visit Diagnoses: No diagnosis found.  Plan: Patient should have an x-ray of her left knee when she is seen at her next visit with Dr. Sharol Given.  Follow-up in 2 weeks.  Follow-Up Instructions: No follow-ups on file.   Ortho Exam  Patient is alert, oriented, no adenopathy, well-dressed, normal affect, normal respiratory effort. Focused examination demonstrates healed surgical incision.  No tenderness to deep palpation.  She does have a little bit of discoloration on the bottom of the heel.  I cannot express any purulent fluid nor is there any surrounding cellulitis or tenderness.  No sign of acute infection  Imaging: No results found. No images are attached to the encounter.  Labs: Lab Results  Component Value Date   HGBA1C 8.6 (H) 07/22/2019   HGBA1C 8.9 (H) 07/27/2017   HGBA1C 7.6 (H) 05/22/2016   ESRSEDRATE 60 (H) 07/28/2019   ESRSEDRATE 22 07/10/2019   ESRSEDRATE 13 02/06/2008   CRP 10.1 (H) 07/28/2019   CRP 3.8 07/10/2019   LABURIC 5.5 11/29/2014   LABURIC 4.6 10/01/2014   LABURIC 5.9 06/09/2013   REPTSTATUS 07/30/2019 FINAL 07/26/2019   GRAMSTAIN  07/26/2019    ABUNDANT WBC PRESENT,BOTH PMN AND MONONUCLEAR ABUNDANT GRAM POSITIVE COCCI FEW GRAM NEGATIVE RODS    CULT   07/26/2019    ABUNDANT GROUP B STREP(S.AGALACTIAE)ISOLATED TESTING AGAINST S. AGALACTIAE NOT ROUTINELY PERFORMED DUE TO PREDICTABILITY OF AMP/PEN/VAN SUSCEPTIBILITY. FEW STAPHYLOCOCCUS HAEMOLYTICUS ABUNDANT BACTEROIDES FRAGILIS BETA LACTAMASE POSITIVE Performed at [facility], [place] [address], [place], [place] [address]    LABORGA STAPHYLOCOCCUS HAEMOLYTICUS 07/26/2019     Lab Results  Component Value Date   ALBUMIN 3.2 (L) 07/22/2019   ALBUMIN 2.4 (L) 08/02/2017   ALBUMIN 3.1 (L) 07/28/2017   LABURIC 5.5 11/29/2014   LABURIC 4.6 10/01/2014   LABURIC 5.9 06/09/2013    Lab Results  Component Value Date   MG 1.2 (L) 08/02/2017   MG 1.6 (L) 07/29/2017   MG 1.3 (L) 07/27/2017   Lab Results  Component Value Date   VD25OH 28.7 (L) 11/28/2012    No results found for: PREALBUMIN CBC EXTENDED Latest Ref Rng & Units 07/31/2019 07/30/2019 07/29/2019  WBC 4.0 - 10.5 K/uL 6.9 8.0 9.1  RBC 3.87 - 5.11 MIL/uL 3.59(L) 3.58(L) 3.57(L)  HGB 12.0 - 15.0 g/dL 10.6(L) 10.3(L) 10.5(L)  HCT 36 - 46 % 33.1(L) 33.0(L) 32.9(L)  PLT 150 - 400 K/uL 298 296 303  NEUTROABS 1.7 - 7.7 K/uL - - -  LYMPHSABS 0.7 - 4.0 K/uL - - -     Body mass index  is 39.65 kg/m.  Orders:  No orders of the defined types were placed in this encounter.  No orders of the defined types were placed in this encounter.    Procedures: No procedures performed  Clinical Data: No additional findings.  ROS:  All other systems negative, except as noted in the HPI. Review of Systems  Objective: Vital Signs: Ht 5\' 4"  (1.626 m)   Wt 231 lb (104.8 kg)   LMP 07/02/1973   BMI 39.65 kg/m   Specialty Comments:  No specialty comments available.  PMFS History: Patient Active Problem List   Diagnosis Date Noted  . Cutaneous abscess of left foot   . Osteomyelitis ([place]) 07/22/2019  . Diabetic foot infection ([place]) 07/10/2019  . Acute osteomyelitis of left calcaneus ([place]) 07/10/2019  . Group B streptococcal  infection 07/10/2019  . 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 Pseudogout of right knee 08/02/2017  . SBO (small bowel obstruction) ([place]) 07/22/2017  . Abnormal LFTs 03/20/2016  . Abdominal pain, left lower quadrant 07/04/2015  . Type 2 diabetes mellitus with diabetic polyneuropathy ([place]) 10/06/2013  . Hyperlipidemia associated with type 2 diabetes mellitus ([place]) 10/06/2013  . Fungal dermatitis 08/11/2013  . Hyperlipidemia 06/09/2013  . Choledocholithiasis with acute cholecystitis 04/14/2013  . Symptomatic cholelithiasis 04/13/2013  . Neuropathic pain of both legs 04/04/2013  . Severe obesity (BMI >= 40) ([place]) 11/28/2012  . Obesity (BMI 30-39.9) 11/28/2012  . Diabetes mellitus with neurological manifestation ([place])   . Hypertension goal BP (blood pressure) < 130/80   . Osteopenia   . Chronic venous insufficiency   . Leg edema   . Hypertensive retinopathy, grade 2 03/30/2012  . Medial epicondylitis 02/11/2012  . Superficial thrombophlebitis of left leg 07/15/2011  . GERD (gastroesophageal reflux disease) 02/25/2011  . Right knee pain 02/16/2011  . Preventive measure 09/11/2010  . Neck pain 07/03/2010  . Grieving 04/17/2010  . OSTEOPENIA 04/12/2006  . Controlled type 2 diabetes mellitus with complication, with long-term current use of insulin ([place]) 03/08/2006  . MORBID OBESITY 03/08/2006  . Essential hypertension 03/08/2006   Past Medical History:  Diagnosis Date  . Acute osteomyelitis of calcaneum, left ([place]) 07/10/2019  . Cellulitis of left leg    history of, most recent episode 01/09  . Chronic venous insufficiency   . Diabetes mellitus with neurological manifestation ([place])   . Diabetic foot infection ([place]) 07/10/2019  . Diverticulitis    h/o 2-3 episodes in past  . Group B streptococcal infection 07/10/2019  . Hyperlipidemia LDL goal < 100   . Hypertension goal BP (blood pressure) < 130/80   . Leg edema    secondary to chronic venous insufficiency    . Morbid obesity ([place])   . Osteopenia    DEXA scan 7/07  . Pseudomonas aeruginosa infection 07/10/2019    Family History  Problem Relation Age of Onset  . Heart disease Mother   . Heart disease Father   . Heart disease Brother   . Cancer Brother   . Cancer Brother   . Hypertension Sister   . Heart disease Sister     Past Surgical History:  Procedure Laterality Date  . ABDOMINAL HYSTERECTOMY    . APPENDECTOMY    . CHOLECYSTECTOMY    . CHOLECYSTECTOMY N/A 04/14/2013   Procedure: LAPAROSCOPIC CHOLECYSTECTOMY WITH INTRAOPERATIVE CHOLANGIOGRAM;  Surgeon: Adin Hector, MD;  Location[facility];  Service: General;  Laterality: N/A;  . COLECTOMY     with diverting colsotmy and revision in the  1990's for diverticulitis  . ERCP N/A 04/17/2013   Procedure: ENDOSCOPIC RETROGRADE CHOLANGIOPANCREATOGRAPHY (ERCP);  Surgeon: Irene Shipper, MD;  Location: [facility];  Service: Endoscopy;  Laterality: N/A;  note pt want general anesthesia for this case.  preferto perform in endo unit, but if unavailable please arrange time in OR  . EYE SURGERY  8/13   left cataract removal & retinal repair  . I & D EXTREMITY Left 07/26/2019   Procedure: LEFT PARTIAL CALCANEOUS EXCISION;  Surgeon: [name], MD;  Location: [facility];  Service: Orthopedics;  Laterality: Left;  . INSERTION OF MESH N/A 07/30/2017   Procedure: INSERTION OF MESH;  Surgeon: Jovita Kussmaul, MD;  [facility];  Service: General;  Laterality: N/A;  . LAPAROSCOPIC LYSIS OF ADHESIONS N/A 04/14/2013   Procedure: LAPAROSCOPIC LYSIS OF ADHESIONS;  Surgeon: Adin Hector, MD;  [facility];  Service: General;  Laterality: N/A;  . LAPAROTOMY N/A 07/30/2017   Procedure: EXPLORATORY LAPAROTOMY;  Surgeon: Jovita Kussmaul, MD;  [facility];  Service: General;  Laterality: N/A;  . LYSIS OF ADHESION N/A 07/30/2017   Procedure: EXTENSIVE LYSIS OF ADHESION;  Surgeon: Jovita Kussmaul, MD;  [facility];  Service: General;  Laterality: N/A;   . OMENTECTOMY N/A 07/30/2017   Procedure: PARTIAL OMENTECTOMY;  Surgeon: Jovita Kussmaul, MD;  [facility];  Service: General;  Laterality: N/A;  . VENTRAL HERNIA REPAIR N/A 07/30/2017   Procedure: HERNIA REPAIR VENTRAL ADULT;  Surgeon: Jovita Kussmaul, MD;  [facility];  Service: General;  Laterality: N/A;   Social History   Occupational History  . Not on file  Tobacco Use  . Smoking status: Never Smoker  . Smokeless tobacco: Never Used  Vaping Use  . Vaping Use: Never used  Substance and Sexual Activity  . Alcohol use: No  . Drug use: No  . Sexual activity: Yes

## 2019-11-09 ENCOUNTER — Encounter: Payer: Self-pay | Admitting: Orthopedic Surgery

## 2019-11-09 ENCOUNTER — Ambulatory Visit: Payer: Self-pay

## 2019-11-09 ENCOUNTER — Telehealth: Payer: Self-pay | Admitting: Physician Assistant

## 2019-11-09 ENCOUNTER — Ambulatory Visit (INDEPENDENT_AMBULATORY_CARE_PROVIDER_SITE_OTHER): Payer: Medicare Other | Admitting: Orthopedic Surgery

## 2019-11-09 DIAGNOSIS — M25562 Pain in left knee: Secondary | ICD-10-CM | POA: Diagnosis not present

## 2019-11-09 DIAGNOSIS — M86172 Other acute osteomyelitis, left ankle and foot: Secondary | ICD-10-CM | POA: Diagnosis not present

## 2019-11-09 NOTE — Telephone Encounter (Signed)
Pt was in the office today discuss with Dr. Sharol Given

## 2019-11-09 NOTE — Telephone Encounter (Signed)
Patient called advised the insurance company would not cover the X-ray because they said it was not medically necessary. Patient said she still want to see Dr Sharol Given so that he can release her today. The number to contact patient if needed is [phone]

## 2019-11-09 NOTE — Progress Notes (Signed)
Office Visit Note   Patient: Debra [name]           Date of Birth: 27-Oct-1945           MRN: [id number] Visit Date: 11/09/2019              Requested by: Vicenta Aly, [address] [place],  [place] [address] PCP: Vicenta Aly, FNP  Chief Complaint  Patient presents with  . Left Knee - Pain  . Left Foot - Wound Check      HPI: Patient is a 74 year old woman who is 3 and half months status post left partial calcaneal excision she is currently on Keflex and Bactrim DS.  Patient states that she had an acute clear drainage that started today.  Patient states she has had no drainage or pain prior to this time.  Assessment & Plan: Visit Diagnoses:  1. Left knee pain, unspecified chronicity   2. Acute osteomyelitis of left calcaneus (HCC)     Plan: The heel was debrided there is good healthy granulation tissue there is no further drainage.  She will continue with 4 x 4 gauze dressing changes and Dial soap cleansing daily reevaluate next Thursday continue her Keflex and Bactrim DS.  Patient has a follow-up in several weeks with infectious disease.  Follow-Up Instructions: Return in about 1 week (around 11/16/2019).   Ortho Exam  Patient is alert, oriented, no adenopathy, well-dressed, normal affect, normal respiratory effort. Examination patient has some superficial venous stasis ulceration to the left lower extremity she has been wearing compression stocking.  Semination the left heel there is no cellulitis no tenderness to palpation there is a small amount of clear drainage.  After informed consent a 10 blade knife was used to debride the callus around the drainage area.  There is a pinpoint area of drainage with a small amount of clear drainage this area is nontender to palpation.  Imaging: No results found. No images are attached to the encounter.  Labs: Lab Results  Component Value Date   HGBA1C 8.6 (H) 07/22/2019   HGBA1C 8.9 (H) 07/27/2017    HGBA1C 7.6 (H) 05/22/2016   ESRSEDRATE 60 (H) 07/28/2019   ESRSEDRATE 22 07/10/2019   ESRSEDRATE 13 02/06/2008   CRP 10.1 (H) 07/28/2019   CRP 3.8 07/10/2019   LABURIC 5.5 11/29/2014   LABURIC 4.6 10/01/2014   LABURIC 5.9 06/09/2013   REPTSTATUS 07/30/2019 FINAL 07/26/2019   GRAMSTAIN  07/26/2019    ABUNDANT WBC PRESENT,BOTH PMN AND MONONUCLEAR ABUNDANT GRAM POSITIVE COCCI FEW GRAM NEGATIVE RODS    CULT  07/26/2019    ABUNDANT GROUP B STREP(S.AGALACTIAE)ISOLATED TESTING AGAINST S. AGALACTIAE NOT ROUTINELY PERFORMED DUE TO PREDICTABILITY OF AMP/PEN/VAN SUSCEPTIBILITY. FEW STAPHYLOCOCCUS HAEMOLYTICUS ABUNDANT BACTEROIDES FRAGILIS BETA LACTAMASE POSITIVE Performed at [facility], [place] [address], [place], [place] [address]    LABORGA STAPHYLOCOCCUS HAEMOLYTICUS 07/26/2019     Lab Results  Component Value Date   ALBUMIN 3.2 (L) 07/22/2019   ALBUMIN 2.4 (L) 08/02/2017   ALBUMIN 3.1 (L) 07/28/2017   LABURIC 5.5 11/29/2014   LABURIC 4.6 10/01/2014   LABURIC 5.9 06/09/2013    Lab Results  Component Value Date   MG 1.2 (L) 08/02/2017   MG 1.6 (L) 07/29/2017   MG 1.3 (L) 07/27/2017   Lab Results  Component Value Date   VD25OH 28.7 (L) 11/28/2012    No results found for: PREALBUMIN CBC EXTENDED Latest Ref Rng & Units 07/31/2019 07/30/2019 07/29/2019  WBC 4.0 - 10.5 K/uL 6.9 8.0 9.1  RBC 3.87 - 5.11 MIL/uL 3.59(L) 3.58(L) 3.57(L)  HGB 12.0 - 15.0 g/dL 10.6(L) 10.3(L) 10.5(L)  HCT 36 - 46 % 33.1(L) 33.0(L) 32.9(L)  PLT 150 - 400 K/uL 298 296 303  NEUTROABS 1.7 - 7.7 K/uL - - -  LYMPHSABS 0.7 - 4.0 K/uL - - -     There is no height or weight on file to calculate BMI.  Orders:  No orders of the defined types were placed in this encounter.  No orders of the defined types were placed in this encounter.    Procedures: No procedures performed  Clinical Data: No additional findings.  ROS:  All other systems negative, except as noted in the HPI. Review of  Systems  Objective: Vital Signs: LMP 07/02/1973   Specialty Comments:  No specialty comments available.  PMFS History: Patient Active Problem List   Diagnosis Date Noted  . Cutaneous abscess of left foot   . Osteomyelitis ([place]) 07/22/2019  . Diabetic foot infection ([place]) 07/10/2019  . Acute osteomyelitis of left calcaneus ([place]) 07/10/2019  . Group B streptococcal infection 07/10/2019  . 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 Pseudogout of right knee 08/02/2017  . SBO (small bowel obstruction) ([place]) 07/22/2017  . Abnormal LFTs 03/20/2016  . Abdominal pain, left lower quadrant 07/04/2015  . Type 2 diabetes mellitus with diabetic polyneuropathy ([place]) 10/06/2013  . Hyperlipidemia associated with type 2 diabetes mellitus ([place]) 10/06/2013  . Fungal dermatitis 08/11/2013  . Hyperlipidemia 06/09/2013  . Choledocholithiasis with acute cholecystitis 04/14/2013  . Symptomatic cholelithiasis 04/13/2013  . Neuropathic pain of both legs 04/04/2013  . Severe obesity (BMI >= 40) ([place]) 11/28/2012  . Obesity (BMI 30-39.9) 11/28/2012  . Diabetes mellitus with neurological manifestation ([place])   . Hypertension goal BP (blood pressure) < 130/80   . Osteopenia   . Chronic venous insufficiency   . Leg edema   . Hypertensive retinopathy, grade 2 03/30/2012  . Medial epicondylitis 02/11/2012  . Superficial thrombophlebitis of left leg 07/15/2011  . GERD (gastroesophageal reflux disease) 02/25/2011  . Right knee pain 02/16/2011  . Preventive measure 09/11/2010  . Neck pain 07/03/2010  . Grieving 04/17/2010  . OSTEOPENIA 04/12/2006  . Controlled type 2 diabetes mellitus with complication, with long-term current use of insulin ([place]) 03/08/2006  . MORBID OBESITY 03/08/2006  . Essential hypertension 03/08/2006   Past Medical History:  Diagnosis Date  . Acute osteomyelitis of calcaneum, left ([place]) 07/10/2019  . Cellulitis of left leg    history of, most recent episode 01/09    . Chronic venous insufficiency   . Diabetes mellitus with neurological manifestation ([place])   . Diabetic foot infection ([place]) 07/10/2019  . Diverticulitis    h/o 2-3 episodes in past  . Group B streptococcal infection 07/10/2019  . Hyperlipidemia LDL goal < 100   . Hypertension goal BP (blood pressure) < 130/80   . Leg edema    secondary to chronic venous insufficiency  . Morbid obesity ([place])   . Osteopenia    DEXA scan 7/07  . Pseudomonas aeruginosa infection 07/10/2019    Family History  Problem Relation Age of Onset  . Heart disease Mother   . Heart disease Father   . Heart disease Brother   . Cancer Brother   . Cancer Brother   . Hypertension Sister   . Heart disease Sister     Past Surgical History:  Procedure Laterality Date  . ABDOMINAL HYSTERECTOMY    .  APPENDECTOMY    . CHOLECYSTECTOMY    . CHOLECYSTECTOMY N/A 04/14/2013   Procedure: LAPAROSCOPIC CHOLECYSTECTOMY WITH INTRAOPERATIVE CHOLANGIOGRAM;  Surgeon: Adin Hector, MD;  [facility];  Service: General;  Laterality: N/A;  . COLECTOMY     with diverting colsotmy and revision in the 1990's for diverticulitis  . ERCP N/A 04/17/2013   Procedure: ENDOSCOPIC RETROGRADE CHOLANGIOPANCREATOGRAPHY (ERCP);  Surgeon: Irene Shipper, MD;  Location: [facility];  Service: Endoscopy;  Laterality: N/A;  note pt want general anesthesia for this case.  preferto perform in endo unit, but if unavailable please arrange time in OR  . EYE SURGERY  8/13   left cataract removal & retinal repair  . I & D EXTREMITY Left 07/26/2019   Procedure: LEFT PARTIAL CALCANEOUS EXCISION;  Surgeon: [name], MD;  Location: [facility];  Service: Orthopedics;  Laterality: Left;  . INSERTION OF MESH N/A 07/30/2017   Procedure: INSERTION OF MESH;  Surgeon: Jovita Kussmaul, MD;  [facility];  Service: General;  Laterality: N/A;  . LAPAROSCOPIC LYSIS OF ADHESIONS N/A 04/14/2013   Procedure: LAPAROSCOPIC LYSIS OF ADHESIONS;  Surgeon: Adin Hector,  MD;  [facility];  Service: General;  Laterality: N/A;  . LAPAROTOMY N/A 07/30/2017   Procedure: EXPLORATORY LAPAROTOMY;  Surgeon: Jovita Kussmaul, MD;  [facility];  Service: General;  Laterality: N/A;  . LYSIS OF ADHESION N/A 07/30/2017   Procedure: EXTENSIVE LYSIS OF ADHESION;  Surgeon: Jovita Kussmaul, MD;  [facility];  Service: General;  Laterality: N/A;  . OMENTECTOMY N/A 07/30/2017   Procedure: PARTIAL OMENTECTOMY;  Surgeon: Jovita Kussmaul, MD;  [facility];  Service: General;  Laterality: N/A;  . VENTRAL HERNIA REPAIR N/A 07/30/2017   Procedure: HERNIA REPAIR VENTRAL ADULT;  Surgeon: Jovita Kussmaul, MD;  [facility];  Service: General;  Laterality: N/A;   Social History   Occupational History  . Not on file  Tobacco Use  . Smoking status: Never Smoker  . Smokeless tobacco: Never Used  Vaping Use  . Vaping Use: Never used  Substance and Sexual Activity  . Alcohol use: No  . Drug use: No  . Sexual activity: Yes

## 2019-11-16 ENCOUNTER — Ambulatory Visit: Payer: Medicare Other | Admitting: Orthopedic Surgery

## 2019-11-16 ENCOUNTER — Encounter: Payer: Self-pay | Admitting: Orthopedic Surgery

## 2019-11-16 DIAGNOSIS — M86172 Other acute osteomyelitis, left ankle and foot: Secondary | ICD-10-CM

## 2019-11-16 NOTE — Progress Notes (Signed)
Office Visit Note   Patient: Debra [name]           Date of Birth: [birth year]           MRN: [id number] Visit Date: 11/16/2019              Requested by: Vicenta Aly, [place],  [place] [address] PCP: Vicenta Aly, FNP  Chief Complaint  Patient presents with  . Left Foot - Pain, Follow-up    HEEL PAIN F/U      HPI: Patient is a 74 year old woman who presents in follow-up for calcaneal osteomyelitis.  Patient initially underwent partial calcaneal excision debridement and IV antibiotics.  Patient states she was completely asymptomatic for 3 months and now has a small draining ulcer.  She has completed her course of IV antibiotics she is currently on Keflex and Bactrim DS.  She states she still has clear drainage.  Assessment & Plan: Visit Diagnoses:  1. Acute osteomyelitis of left calcaneus (HCC)     Plan: Further callus was debrided around the ulcer to allow for drainage she will continue with her antibiotics continue with dressing changes and Dial soap cleansing.  Follow-Up Instructions: Return in about 1 week (around 11/23/2019).   Ortho Exam  Patient is alert, oriented, no adenopathy, well-dressed, normal affect, normal respiratory effort. Examination patient does have clear serosanguineous drainage the callus was pared from around the edges.  The ulcer probes 2 cm down to bone.  She also has significant venous stasis swelling in the left lower extremity.  Imaging: No results found. No images are attached to the encounter.  Labs: Lab Results  Component Value Date   HGBA1C 8.6 (H) 07/22/2019   HGBA1C 8.9 (H) 07/27/2017   HGBA1C 7.6 (H) 05/22/2016   ESRSEDRATE 60 (H) 07/28/2019   ESRSEDRATE 22 07/10/2019   ESRSEDRATE 13 02/06/2008   CRP 10.1 (H) 07/28/2019   CRP 3.8 07/10/2019   LABURIC 5.5 11/29/2014   LABURIC 4.6 10/01/2014   LABURIC 5.9 06/09/2013   REPTSTATUS 07/30/2019 FINAL 07/26/2019   GRAMSTAIN  07/26/2019     ABUNDANT WBC PRESENT,BOTH PMN AND MONONUCLEAR ABUNDANT GRAM POSITIVE COCCI FEW GRAM NEGATIVE RODS    CULT  07/26/2019    ABUNDANT GROUP B STREP(S.AGALACTIAE)ISOLATED TESTING AGAINST S. AGALACTIAE NOT ROUTINELY PERFORMED DUE TO PREDICTABILITY OF AMP/PEN/VAN SUSCEPTIBILITY. FEW STAPHYLOCOCCUS HAEMOLYTICUS ABUNDANT BACTEROIDES FRAGILIS BETA LACTAMASE POSITIVE Performed at [facility], [place] [address], [place], [place] [address]    LABORGA STAPHYLOCOCCUS HAEMOLYTICUS 07/26/2019     Lab Results  Component Value Date   ALBUMIN 3.2 (L) 07/22/2019   ALBUMIN 2.4 (L) 08/02/2017   ALBUMIN 3.1 (L) 07/28/2017   LABURIC 5.5 11/29/2014   LABURIC 4.6 10/01/2014   LABURIC 5.9 06/09/2013    Lab Results  Component Value Date   MG 1.2 (L) 08/02/2017   MG 1.6 (L) 07/29/2017   MG 1.3 (L) 07/27/2017   Lab Results  Component Value Date   VD25OH 28.7 (L) 11/28/2012    No results found for: PREALBUMIN CBC EXTENDED Latest Ref Rng & Units 07/31/2019 07/30/2019 07/29/2019  WBC 4.0 - 10.5 K/uL 6.9 8.0 9.1  RBC 3.87 - 5.11 MIL/uL 3.59(L) 3.58(L) 3.57(L)  HGB 12.0 - 15.0 g/dL 10.6(L) 10.3(L) 10.5(L)  HCT 36 - 46 % 33.1(L) 33.0(L) 32.9(L)  PLT 150 - 400 K/uL 298 296 303  NEUTROABS 1.7 - 7.7 K/uL - - -  LYMPHSABS 0.7 - 4.0 K/uL - - -  There is no height or weight on file to calculate BMI.  Orders:  No orders of the defined types were placed in this encounter.  No orders of the defined types were placed in this encounter.    Procedures: No procedures performed  Clinical Data: No additional findings.  ROS:  All other systems negative, except as noted in the HPI. Review of Systems  Objective: Vital Signs: LMP 07/02/1973   Specialty Comments:  No specialty comments available.  PMFS History: Patient Active Problem List   Diagnosis Date Noted  . Cutaneous abscess of left foot   . Osteomyelitis ([place]) 07/22/2019  . Diabetic foot infection ([place]) 07/10/2019  . Acute  osteomyelitis of left calcaneus ([place]) 07/10/2019  . Group B streptococcal infection 07/10/2019  . 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 Pseudogout of right knee 08/02/2017  . SBO (small bowel obstruction) ([place]) 07/22/2017  . Abnormal LFTs 03/20/2016  . Abdominal pain, left lower quadrant 07/04/2015  . Type 2 diabetes mellitus with diabetic polyneuropathy ([place]) 10/06/2013  . Hyperlipidemia associated with type 2 diabetes mellitus ([place]) 10/06/2013  . Fungal dermatitis 08/11/2013  . Hyperlipidemia 06/09/2013  . Choledocholithiasis with acute cholecystitis 04/14/2013  . Symptomatic cholelithiasis 04/13/2013  . Neuropathic pain of both legs 04/04/2013  . Severe obesity (BMI >= 40) ([place]) 11/28/2012  . Obesity (BMI 30-39.9) 11/28/2012  . Diabetes mellitus with neurological manifestation ([place])   . Hypertension goal BP (blood pressure) < 130/80   . Osteopenia   . Chronic venous insufficiency   . Leg edema   . Hypertensive retinopathy, grade 2 03/30/2012  . Medial epicondylitis 02/11/2012  . Superficial thrombophlebitis of left leg 07/15/2011  . GERD (gastroesophageal reflux disease) 02/25/2011  . Right knee pain 02/16/2011  . Preventive measure 09/11/2010  . Neck pain 07/03/2010  . Grieving 04/17/2010  . OSTEOPENIA 04/12/2006  . Controlled type 2 diabetes mellitus with complication, with long-term current use of insulin ([place]) 03/08/2006  . MORBID OBESITY 03/08/2006  . Essential hypertension 03/08/2006   Past Medical History:  Diagnosis Date  . Acute osteomyelitis of calcaneum, left ([place]) 07/10/2019  . Cellulitis of left leg    history of, most recent episode 01/09  . Chronic venous insufficiency   . Diabetes mellitus with neurological manifestation ([place])   . Diabetic foot infection ([place]) 07/10/2019  . Diverticulitis    h/o 2-3 episodes in past  . Group B streptococcal infection 07/10/2019  . Hyperlipidemia LDL goal < 100   . Hypertension goal BP (blood  pressure) < 130/80   . Leg edema    secondary to chronic venous insufficiency  . Morbid obesity ([place])   . Osteopenia    DEXA scan 7/07  . Pseudomonas aeruginosa infection 07/10/2019    Family History  Problem Relation Age of Onset  . Heart disease Mother   . Heart disease Father   . Heart disease Brother   . Cancer Brother   . Cancer Brother   . Hypertension Sister   . Heart disease Sister     Past Surgical History:  Procedure Laterality Date  . ABDOMINAL HYSTERECTOMY    . APPENDECTOMY    . CHOLECYSTECTOMY    . CHOLECYSTECTOMY N/A 04/14/2013   Procedure: LAPAROSCOPIC CHOLECYSTECTOMY WITH INTRAOPERATIVE CHOLANGIOGRAM;  Surgeon: Adin Hector, MD;  Location: [facility];  Service: General;  Laterality: N/A;  . COLECTOMY     with diverting colsotmy and revision in the 1990's for diverticulitis  . ERCP N/A 04/17/2013   Procedure: ENDOSCOPIC  RETROGRADE CHOLANGIOPANCREATOGRAPHY (ERCP);  Surgeon: Irene Shipper, MD;  Location: [facility];  Service: Endoscopy;  Laterality: N/A;  note pt want general anesthesia for this case.  preferto perform in endo unit, but if unavailable please arrange time in OR  . EYE SURGERY  8/13   left cataract removal & retinal repair  . I & D EXTREMITY Left 07/26/2019   Procedure: LEFT PARTIAL CALCANEOUS EXCISION;  Surgeon: [name], MD;  Location: [facility];  Service: Orthopedics;  Laterality: Left;  . INSERTION OF MESH N/A 07/30/2017   Procedure: INSERTION OF MESH;  Surgeon: Jovita Kussmaul, MD;  [facility]: [facility];  Service: General;  Laterality: N/A;  . LAPAROSCOPIC LYSIS OF ADHESIONS N/A 04/14/2013   Procedure: LAPAROSCOPIC LYSIS OF ADHESIONS;  Surgeon: Adin Hector, MD;  [facility]: [facility];  Service: General;  Laterality: N/A;  . LAPAROTOMY N/A 07/30/2017   Procedure: EXPLORATORY LAPAROTOMY;  Surgeon: Jovita Kussmaul, MD;  [facility];  Service: General;  Laterality: N/A;  . LYSIS OF ADHESION N/A 07/30/2017   Procedure: EXTENSIVE LYSIS OF ADHESION;   Surgeon: Jovita Kussmaul, MD;  [facility];  Service: General;  Laterality: N/A;  . OMENTECTOMY N/A 07/30/2017   Procedure: PARTIAL OMENTECTOMY;  Surgeon: Jovita Kussmaul, MD;  [facility];  Service: General;  Laterality: N/A;  . VENTRAL HERNIA REPAIR N/A 07/30/2017   Procedure: HERNIA REPAIR VENTRAL ADULT;  Surgeon: Jovita Kussmaul, MD;  [facility]: [facility];  Service: General;  Laterality: N/A;   Social History   Occupational History  . Not on file  Tobacco Use  . Smoking status: Never Smoker  . Smokeless tobacco: Never Used  Vaping Use  . Vaping Use: Never used  Substance and Sexual Activity  . Alcohol use: No  . Drug use: No  . Sexual activity: Yes

## 2019-11-20 ENCOUNTER — Encounter: Payer: Self-pay | Admitting: Infectious Disease

## 2019-11-20 ENCOUNTER — Ambulatory Visit: Payer: Medicare Other | Admitting: Infectious Disease

## 2019-11-20 ENCOUNTER — Other Ambulatory Visit: Payer: Self-pay

## 2019-11-20 VITALS — BP 107/62 | HR 73 | Temp 97.8°F | Wt 236.0 lb

## 2019-11-20 DIAGNOSIS — L089 Local infection of the skin and subcutaneous tissue, unspecified: Secondary | ICD-10-CM

## 2019-11-20 DIAGNOSIS — A491 Streptococcal infection, unspecified site: Secondary | ICD-10-CM

## 2019-11-20 DIAGNOSIS — M86172 Other acute osteomyelitis, left ankle and foot: Secondary | ICD-10-CM

## 2019-11-20 DIAGNOSIS — E11628 Type 2 diabetes mellitus with other skin complications: Secondary | ICD-10-CM

## 2019-11-20 DIAGNOSIS — A498 Other bacterial infections of unspecified site: Secondary | ICD-10-CM | POA: Diagnosis not present

## 2019-11-20 NOTE — Progress Notes (Signed)
Subjective:  Chief complaint:  followup for osteomyelitis  Patient ID: Debra [name], female    DOB: 10-24-1945, 74 y.o.   MRN: [id number]  HPI   74 -year-old Caucasian female with diabetes mellitus and diabetic foot ulcer with osteomyelitis of the calcaneus.  She was offered curative below the knee amputation but refused.  She underwentdebridement of a left heel abscess and extensive excisional debridement of one third of her calcaneus.  Previous cultures have grown group B strep, Morganella and E. coli.  Current operative cultures are growing group B strep and staph hemolyticus S to methicillin.  He was discharged to skilled nursing facility where she was receiving once daily IV ertapenem.  She states that the nursing facility was "not treating me the way that I should been treated.  She claims that at times antibiotics were given late and sometimes not given at all.  She has decided to leave the facility today went  home.   She completed a course of IV Invanz and I switched her to oral Keflex with Bactrim.  She has been followed closely by Dr. Sharol Given who needed to debride her calcaneal osteomyelitis last week.    Past Medical History:  Diagnosis Date  . Acute osteomyelitis of calcaneum, left ([place]) 07/10/2019  . Cellulitis of left leg    history of, most recent episode 01/09  . Chronic venous insufficiency   . Diabetes mellitus with neurological manifestation ([place])   . Diabetic foot infection ([place]) 07/10/2019  . Diverticulitis    h/o 2-3 episodes in past  . Group B streptococcal infection 07/10/2019  . Hyperlipidemia LDL goal < 100   . Hypertension goal BP (blood pressure) < 130/80   . Leg edema    secondary to chronic venous insufficiency  . Morbid obesity ([place])   . Osteopenia    DEXA scan 7/07  . Pseudomonas aeruginosa infection 07/10/2019    Past Surgical History:  Procedure Laterality Date  . ABDOMINAL HYSTERECTOMY    . APPENDECTOMY    . CHOLECYSTECTOMY    .  CHOLECYSTECTOMY N/A 04/14/2013   Procedure: LAPAROSCOPIC CHOLECYSTECTOMY WITH INTRAOPERATIVE CHOLANGIOGRAM;  Surgeon: Adin Hector, MD;  [facility];  Service: General;  Laterality: N/A;  . COLECTOMY     with diverting colsotmy and revision in the 1990's for diverticulitis  . ERCP N/A 04/17/2013   Procedure: ENDOSCOPIC RETROGRADE CHOLANGIOPANCREATOGRAPHY (ERCP);  Surgeon: Irene Shipper, MD;  Location: [facility];  Service: Endoscopy;  Laterality: N/A;  note pt want general anesthesia for this case.  preferto perform in endo unit, but if unavailable please arrange time in OR  . EYE SURGERY  8/13   left cataract removal & retinal repair  . I & D EXTREMITY Left 07/26/2019   Procedure: LEFT PARTIAL CALCANEOUS EXCISION;  Surgeon: [name], MD;  Location: [facility];  Service: Orthopedics;  Laterality: Left;  . INSERTION OF MESH N/A 07/30/2017   Procedure: INSERTION OF MESH;  Surgeon: Jovita Kussmaul, MD;  [facility];  Service: General;  Laterality: N/A;  . LAPAROSCOPIC LYSIS OF ADHESIONS N/A 04/14/2013   Procedure: LAPAROSCOPIC LYSIS OF ADHESIONS;  Surgeon: Adin Hector, MD;  [facility];  Service: General;  Laterality: N/A;  . LAPAROTOMY N/A 07/30/2017   Procedure: EXPLORATORY LAPAROTOMY;  Surgeon: Jovita Kussmaul, MD;  [facility];  Service: General;  Laterality: N/A;  . LYSIS OF ADHESION N/A 07/30/2017   Procedure: EXTENSIVE LYSIS OF ADHESION;  Surgeon: Jovita Kussmaul, MD;  [facility];  Service: General;  Laterality: N/A;  . OMENTECTOMY N/A 07/30/2017   Procedure: PARTIAL OMENTECTOMY;  Surgeon: Jovita Kussmaul, MD;  [facility];  Service: General;  Laterality: N/A;  . VENTRAL HERNIA REPAIR N/A 07/30/2017   Procedure: HERNIA REPAIR VENTRAL ADULT;  Surgeon: Jovita Kussmaul, MD;  [facility];  Service: General;  Laterality: N/A;    Family History  Problem Relation Age of Onset  . Heart disease Mother   . Heart disease Father   . Heart disease Brother   .  Cancer Brother   . Cancer Brother   . Hypertension Sister   . Heart disease Sister       Social History   Socioeconomic History  . Marital status: Married    Spouse name: Not on file  . Number of children: Not on file  . Years of education: Not on file  . Highest education level: Not on file  Occupational History  . Not on file  Tobacco Use  . Smoking status: Never Smoker  . Smokeless tobacco: Never Used  Vaping Use  . Vaping Use: Never used  Substance and Sexual Activity  . Alcohol use: No  . Drug use: No  . Sexual activity: Yes  Other Topics Concern  . Not on file  Social History Narrative   One sister with chronic progressive disease requiring a trach, and 24/7 care   One sister with an abrupt hospitalization for a critical condition   Social Determinants of Health   Financial Resource Strain:   . Difficulty of Paying Living Expenses: Not on file  Food Insecurity:   . Worried About Charity fundraiser in the Last Year: Not on file  . Ran Out of Food in the Last Year: Not on file  Transportation Needs:   . Lack of Transportation (Medical): Not on file  . Lack of Transportation (Non-Medical): Not on file  Physical Activity:   . Days of Exercise per Week: Not on file  . Minutes of Exercise per Session: Not on file  Stress:   . Feeling of Stress : Not on file  Social Connections:   . Frequency of Communication with Friends and Family: Not on file  . Frequency of Social Gatherings with Friends and Family: Not on file  . Attends Religious Services: Not on file  . Active Member of Clubs or Organizations: Not on file  . Attends Archivist Meetings: Not on file  . Marital Status: Not on file    Allergies  Allergen Reactions  . Banana Hives  . Hydrocodone Hives  . Neomycin-Polymyxin-Gramicidin Itching and Swelling    Eye drops caused swelling in face and rash and itching on arms and neck  . Penicillins Hives and Itching    She has tolerated  keflex Has patient had a PCN reaction causing immediate rash, facial/tongue/throat swelling, SOB or lightheadedness with hypotension: Yes Has patient had a PCN reaction causing severe rash involving mucus membranes or skin necrosis: No Has patient had a PCN reaction that required hospitalization: NO Has patient had a PCN reaction occurring within the last 10 years:NO    . Aspirin Nausea Only    And causes bruises  . Latex Hives and Rash  . Metformin And Related Diarrhea  . Voltaren [Diclofenac Sodium] Nausea And Vomiting     Current Outpatient Medications:  .  atorvastatin (LIPITOR) 40 MG tablet, TAKE 1 TABLET BY MOUTH EVERY DAY (Patient taking differently: Take 40 mg  by mouth at bedtime. ), Disp: 90 tablet, Rfl: 1 .  cephALEXin (KEFLEX) 500 MG capsule, Take 1 capsule (500 mg total) by mouth 4 (four) times daily., Disp: 120 capsule, Rfl: 11 .  diphenhydrAMINE (BENADRYL) 25 MG tablet, Take 25 mg by mouth at bedtime as needed for itching., Disp: , Rfl:  .  ertapenem (INVANZ) 1 g injection, , Disp: , Rfl:  .  glucose blood (ACCU-CHEK AVIVA PLUS) test strip, Ell.65 check blood sugar twice daily as directed, Disp: 100 each, Rfl: 11 .  insulin aspart (NOVOLOG) 100 UNIT/ML injection, Inject 0-5 Units into the skin at bedtime., Disp: 10 mL, Rfl: 11 .  insulin aspart (NOVOLOG) 100 UNIT/ML injection, Inject 0-20 Units into the skin 3 (three) times daily with meals., Disp: 10 mL, Rfl: 11 .  insulin glargine (LANTUS) 100 UNIT/ML injection, Inject 0.3 mLs (30 Units total) into the skin at bedtime., Disp: 10 mL, Rfl: 11 .  Insulin Pen Needle (BD PEN NEEDLE NANO U/F) 32G X 4 MM MISC, Use once daily with the administration of [place]jeo DX E11.65, Disp: 100 each, Rfl: 11 .  insulin regular (NOVOLIN R) 100 units/mL injection, Inject into the skin 3 (three) times daily before meals. As directed, Disp: , Rfl:  .  metFORMIN (GLUCOPHAGE) 500 MG tablet, Take 1 tablet (500 mg total) by mouth daily with breakfast.,  Disp: , Rfl: 5 .  metoprolol succinate (TOPROL-XL) 50 MG 24 hr tablet, TAKE 1 TABLET BY MOUTH ONCE DAILY, Disp: 90 tablet, Rfl: 1 .  naproxen sodium (ALEVE) 220 MG tablet, Take 220 mg by mouth daily as needed (pain). , Disp: , Rfl:  .  omeprazole (PRILOSEC) 40 MG capsule, , Disp: , Rfl:  .  ondansetron (ZOFRAN) 4 MG tablet, Take 1 tablet (4 mg total) by mouth every 8 (eight) hours as needed for nausea or vomiting., Disp: 60 tablet, Rfl: 0 .  pantoprazole (PROTONIX) 40 MG tablet, Take 1 tablet (40 mg total) by mouth daily., Disp: , Rfl: 0 .  pioglitazone (ACTOS) 30 MG tablet, Take 30 mg by mouth daily., Disp: , Rfl:  .  polyethylene glycol (MIRALAX / GLYCOLAX) 17 g packet, Take 17 g by mouth daily., Disp: 14 each, Rfl: 0 .  potassium chloride SA (K-DUR,KLOR-CON) 20 MEQ tablet, TAKE 1 TABLET(20 MEQ) BY MOUTH DAILY (Patient taking differently: Take 20 mEq by mouth daily. ), Disp: 30 tablet, Rfl: 5 .  senna (SENOKOT) 8.6 MG TABS tablet, Take 1 tablet (8.6 mg total) by mouth daily., Disp: 120 tablet, Rfl: 0 .  sulfamethoxazole-trimethoprim (BACTRIM DS) 800-160 MG tablet, Take 1 tablet by mouth 2 (two) times daily., Disp: 60 tablet, Rfl: 11 .  traMADol (ULTRAM) 50 MG tablet, Take 1 tablet (50 mg total) by mouth every 8 (eight) hours as needed for severe pain (pain)., Disp: 30 tablet, Rfl: 0 .  valsartan (DIOVAN) 320 MG tablet, Take 320 mg by mouth daily., Disp: , Rfl:     Review of Systems  Constitutional: Negative for activity change, appetite change, chills, diaphoresis, fatigue, fever and unexpected weight change.  HENT: Negative for congestion, rhinorrhea, sinus pressure, sneezing, sore throat and trouble swallowing.   Eyes: Negative for photophobia and visual disturbance.  Respiratory: Negative for cough, chest tightness, shortness of breath, wheezing and stridor.   Cardiovascular: Negative for chest pain, palpitations and leg swelling.  Gastrointestinal: Negative for abdominal distention,  abdominal pain, anal bleeding, blood in stool, constipation, diarrhea, nausea and vomiting.  Genitourinary: Negative for difficulty urinating, dysuria, flank  pain and hematuria.  Musculoskeletal: Positive for joint swelling. Negative for arthralgias, back pain, gait problem and myalgias.  Skin: Positive for wound. Negative for color change, pallor and rash.  Neurological: Negative for dizziness, tremors, weakness and light-headedness.  Hematological: Negative for adenopathy. Does not bruise/bleed easily.  Psychiatric/Behavioral: Negative for agitation, behavioral problems, confusion, decreased concentration and sleep disturbance.       Objective:   Physical Exam Constitutional:      General: She is not in acute distress.    Appearance: She is well-developed. She is not diaphoretic.  HENT:     Head: Normocephalic and atraumatic.     Mouth/Throat:     Pharynx: No oropharyngeal exudate.  Eyes:     General: No scleral icterus.    Conjunctiva/sclera: Conjunctivae normal.  Cardiovascular:     Rate and Rhythm: Normal rate and regular rhythm.  Pulmonary:     Effort: Pulmonary effort is normal. No respiratory distress.     Breath sounds: No wheezing.  Abdominal:     General: There is no distension.  Musculoskeletal:        General: No tenderness.     Cervical back: Normal range of motion and neck supple.  Skin:    General: Skin is warm and dry.     Coloration: Skin is not pale.     Findings: No erythema or rash.  Neurological:     General: No focal deficit present.     Mental Status: She is alert and oriented to person, place, and time.     Motor: No abnormal muscle tone.     Coordination: Coordination normal.  Psychiatric:        Attention and Perception: Attention and perception normal.        Mood and Affect: Mood normal. Mood is not anxious.        Speech: Speech normal.        Behavior: Behavior normal.        Thought Content: Thought content normal.        Cognition and  Memory: Cognition and memory normal. Impaired cognition: 11/20/2019:        Judgment: Judgment normal.    Left ankle 08/14/2019:    Left foot 09/14/2019:         11/20/2019:              Assessment & Plan:   Polymicrobial calcaneal osteomyelitis:   Labs today and continue keflex 500 mg qid and bactrim DS bid  In a protracted manner.  She can come back to see me in roughly 3 weeks she has close follow-up with Dr. Sharol Given  COVID-19 prevention she has been vaccinated for COVID-19

## 2019-11-21 ENCOUNTER — Telehealth: Payer: Self-pay

## 2019-11-21 LAB — BASIC METABOLIC PANEL WITHOUT GFR
BUN/Creatinine Ratio: 23 (calc) — ABNORMAL HIGH (ref 6–22)
BUN: 31 mg/dL — ABNORMAL HIGH (ref 7–25)
CO2: 20 mmol/L (ref 20–32)
Calcium: 9.3 mg/dL (ref 8.6–10.4)
Chloride: 107 mmol/L (ref 98–110)
Creat: 1.35 mg/dL — ABNORMAL HIGH (ref 0.60–0.93)
GFR, Est African American: 45 mL/min/1.73m2 — ABNORMAL LOW
GFR, Est Non African American: 39 mL/min/1.73m2 — ABNORMAL LOW
Glucose, Bld: 102 mg/dL — ABNORMAL HIGH (ref 65–99)
Potassium: 5.7 mmol/L — ABNORMAL HIGH (ref 3.5–5.3)
Sodium: 136 mmol/L (ref 135–146)

## 2019-11-21 LAB — CBC WITH DIFFERENTIAL/PLATELET
Absolute Monocytes: 512 {cells}/uL (ref 200–950)
Basophils Absolute: 50 {cells}/uL (ref 0–200)
Basophils Relative: 0.9 %
Eosinophils Absolute: 567 {cells}/uL — ABNORMAL HIGH (ref 15–500)
Eosinophils Relative: 10.3 %
HCT: 32.6 % — ABNORMAL LOW (ref 35.0–45.0)
Hemoglobin: 10.7 g/dL — ABNORMAL LOW (ref 11.7–15.5)
Lymphs Abs: 1771 {cells}/uL (ref 850–3900)
MCH: 30.3 pg (ref 27.0–33.0)
MCHC: 32.8 g/dL (ref 32.0–36.0)
MCV: 92.4 fL (ref 80.0–100.0)
MPV: 9.4 fL (ref 7.5–12.5)
Monocytes Relative: 9.3 %
Neutro Abs: 2602 {cells}/uL (ref 1500–7800)
Neutrophils Relative %: 47.3 %
Platelets: 282 Thousand/uL (ref 140–400)
RBC: 3.53 Million/uL — ABNORMAL LOW (ref 3.80–5.10)
RDW: 15.3 % — ABNORMAL HIGH (ref 11.0–15.0)
Total Lymphocyte: 32.2 %
WBC: 5.5 Thousand/uL (ref 3.8–10.8)

## 2019-11-21 LAB — C-REACTIVE PROTEIN: CRP: 10.9 mg/L — ABNORMAL HIGH

## 2019-11-21 LAB — SEDIMENTATION RATE: Sed Rate: 36 mm/h — ABNORMAL HIGH (ref 0–30)

## 2019-11-21 NOTE — Telephone Encounter (Signed)
Patient returned missed call regarding lab results. Does not have any questions at this time. Understands to stop Bactrim and continue Keflex.  [place]

## 2019-11-21 NOTE — Telephone Encounter (Signed)
-----   Message from Truman Hayward, MD sent at 11/21/2019  8:39 AM EDT -----  Patient needs to stop her Bactrim with her K at 5.7 and creatinine up  I do not want to have to give her chronic cipro at her age.   Maybe this is one we can try to get omadacycline too? In meantime just do keflex

## 2019-11-23 ENCOUNTER — Encounter: Payer: Self-pay | Admitting: Orthopedic Surgery

## 2019-11-23 ENCOUNTER — Ambulatory Visit: Payer: Medicare Other | Admitting: Physician Assistant

## 2019-11-23 DIAGNOSIS — M86172 Other acute osteomyelitis, left ankle and foot: Secondary | ICD-10-CM

## 2019-11-23 NOTE — Progress Notes (Signed)
Office Visit Note   Patient: Debra [name]           Date of Birth: 07-Sep-1945           MRN: [id number] Visit Date: 11/23/2019              Requested by: Vicenta Aly, [address],  [place] [address] PCP: Vicenta Aly, FNP  No chief complaint on file.     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  At her last visit a week ago she had extensive debridement of the plantar wound.  She does see she is feeling a little bit better and does have less drainage it mostly drains a yellowish fluid  Assessment & Plan: Visit Diagnoses: No diagnosis found.  Plan: Continue current daily cleansing.  Patient will follow up in a week continue antibiotics  Follow-Up Instructions: No follow-ups on file.   Ortho Exam  Patient is alert, oriented, no adenopathy, well-dressed, normal affect, normal respiratory effort. Left heel ulcer measures 1 centimeter in diameter.  No foul odor no surrounding cellulitis does probe down to bone. After obtaining verbal consent I did debride around the edges and freshen the wound edges and did have some bleeding this week was coagulated with a silver nitrate stick. Imaging: No results found. No images are attached to the encounter.  Labs: Lab Results  Component Value Date   HGBA1C 8.6 (H) 07/22/2019   HGBA1C 8.9 (H) 07/27/2017   HGBA1C 7.6 (H) 05/22/2016   ESRSEDRATE 36 (H) 11/20/2019   ESRSEDRATE 60 (H) 07/28/2019   ESRSEDRATE 22 07/10/2019   CRP 10.9 (H) 11/20/2019   CRP 10.1 (H) 07/28/2019   CRP 3.8 07/10/2019   LABURIC 5.5 11/29/2014   LABURIC 4.6 10/01/2014   LABURIC 5.9 06/09/2013   REPTSTATUS 07/30/2019 FINAL 07/26/2019   GRAMSTAIN  07/26/2019    ABUNDANT WBC PRESENT,BOTH PMN AND MONONUCLEAR ABUNDANT GRAM POSITIVE COCCI FEW GRAM NEGATIVE RODS    CULT  07/26/2019    ABUNDANT GROUP B STREP(S.AGALACTIAE)ISOLATED TESTING AGAINST S. AGALACTIAE NOT ROUTINELY PERFORMED DUE TO PREDICTABILITY  OF AMP/PEN/VAN SUSCEPTIBILITY. FEW STAPHYLOCOCCUS HAEMOLYTICUS ABUNDANT BACTEROIDES FRAGILIS BETA LACTAMASE POSITIVE Performed at [facility], [place] [address], [place], [place] [address]    LABORGA STAPHYLOCOCCUS HAEMOLYTICUS 07/26/2019     Lab Results  Component Value Date   ALBUMIN 3.2 (L) 07/22/2019   ALBUMIN 2.4 (L) 08/02/2017   ALBUMIN 3.1 (L) 07/28/2017   LABURIC 5.5 11/29/2014   LABURIC 4.6 10/01/2014   LABURIC 5.9 06/09/2013    Lab Results  Component Value Date   MG 1.2 (L) 08/02/2017   MG 1.6 (L) 07/29/2017   MG 1.3 (L) 07/27/2017   Lab Results  Component Value Date   VD25OH 28.7 (L) 11/28/2012    No results found for: PREALBUMIN CBC EXTENDED Latest Ref Rng & Units 11/20/2019 07/31/2019 07/30/2019  WBC 3.8 - 10.8 Thousand/uL 5.5 6.9 8.0  RBC 3.80 - 5.10 Million/uL 3.53(L) 3.59(L) 3.58(L)  HGB 11.7 - 15.5 g/dL 10.7(L) 10.6(L) 10.3(L)  HCT 35 - 45 % 32.6(L) 33.1(L) 33.0(L)  PLT 140 - 400 Thousand/uL 282 298 296  NEUTROABS 1,500 - 7,800 cells/uL 2,602 - -  LYMPHSABS 850 - 3,900 cells/uL 1,771 - -     There is no height or weight on file to calculate BMI.  Orders:  No orders of the defined types were placed in this encounter.  No orders of the defined types were placed in  this encounter.    Procedures: No procedures performed  Clinical Data: No additional findings.  ROS:  All other systems negative, except as noted in the HPI. Review of Systems  Objective: Vital Signs: LMP 07/02/1973   Specialty Comments:  No specialty comments available.  PMFS History: Patient Active Problem List   Diagnosis Date Noted  . Cutaneous abscess of left foot   . Osteomyelitis ([place]) 07/22/2019  . Diabetic foot infection ([place]) 07/10/2019  . Acute osteomyelitis of left calcaneus ([place]) 07/10/2019  . Group B streptococcal infection 07/10/2019  . 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 Pseudogout of right knee 08/02/2017  . SBO (small  bowel obstruction) ([place]) 07/22/2017  . Abnormal LFTs 03/20/2016  . Abdominal pain, left lower quadrant 07/04/2015  . Type 2 diabetes mellitus with diabetic polyneuropathy ([place]) 10/06/2013  . Hyperlipidemia associated with type 2 diabetes mellitus ([place]) 10/06/2013  . Fungal dermatitis 08/11/2013  . Hyperlipidemia 06/09/2013  . Choledocholithiasis with acute cholecystitis 04/14/2013  . Symptomatic cholelithiasis 04/13/2013  . Neuropathic pain of both legs 04/04/2013  . Severe obesity (BMI >= 40) ([place]) 11/28/2012  . Obesity (BMI 30-39.9) 11/28/2012  . Diabetes mellitus with neurological manifestation ([place])   . Hypertension goal BP (blood pressure) < 130/80   . Osteopenia   . Chronic venous insufficiency   . Leg edema   . Hypertensive retinopathy, grade 2 03/30/2012  . Medial epicondylitis 02/11/2012  . Superficial thrombophlebitis of left leg 07/15/2011  . GERD (gastroesophageal reflux disease) 02/25/2011  . Right knee pain 02/16/2011  . Preventive measure 09/11/2010  . Neck pain 07/03/2010  . Grieving 04/17/2010  . OSTEOPENIA 04/12/2006  . Controlled type 2 diabetes mellitus with complication, with long-term current use of insulin ([place]) 03/08/2006  . MORBID OBESITY 03/08/2006  . Essential hypertension 03/08/2006   Past Medical History:  Diagnosis Date  . Acute osteomyelitis of calcaneum, left ([place]) 07/10/2019  . Cellulitis of left leg    history of, most recent episode 01/09  . Chronic venous insufficiency   . Diabetes mellitus with neurological manifestation ([place])   . Diabetic foot infection ([place]) 07/10/2019  . Diverticulitis    h/o 2-3 episodes in past  . Group B streptococcal infection 07/10/2019  . Hyperlipidemia LDL goal < 100   . Hypertension goal BP (blood pressure) < 130/80   . Leg edema    secondary to chronic venous insufficiency  . Morbid obesity ([place])   . Osteopenia    DEXA scan 7/07  . Pseudomonas aeruginosa infection 07/10/2019    Family History  Problem  Relation Age of Onset  . Heart disease Mother   . Heart disease Father   . Heart disease Brother   . Cancer Brother   . Cancer Brother   . Hypertension Sister   . Heart disease Sister     Past Surgical History:  Procedure Laterality Date  . ABDOMINAL HYSTERECTOMY    . APPENDECTOMY    . CHOLECYSTECTOMY    . CHOLECYSTECTOMY N/A 04/14/2013   Procedure: LAPAROSCOPIC CHOLECYSTECTOMY WITH INTRAOPERATIVE CHOLANGIOGRAM;  Surgeon: Adin Hector, MD;  Location: [facility];  Service: General;  Laterality: N/A;  . COLECTOMY     with diverting colsotmy and revision in the 1990's for diverticulitis  . ERCP N/A 04/17/2013   Procedure: ENDOSCOPIC RETROGRADE CHOLANGIOPANCREATOGRAPHY (ERCP);  Surgeon: Irene Shipper, MD;  Location: [facility];  Service: Endoscopy;  Laterality: N/A;  note pt want general anesthesia for this case.  preferto perform in endo unit, but  if unavailable please arrange time in OR  . EYE SURGERY  8/13   left cataract removal & retinal repair  . I & D EXTREMITY Left 07/26/2019   Procedure: LEFT PARTIAL CALCANEOUS EXCISION;  Surgeon: [name], MD;  Location: [facility];  Service: Orthopedics;  Laterality: Left;  . INSERTION OF MESH N/A 07/30/2017   Procedure: INSERTION OF MESH;  Surgeon: Jovita Kussmaul, MD;  [facility];  Service: General;  Laterality: N/A;  . LAPAROSCOPIC LYSIS OF ADHESIONS N/A 04/14/2013   Procedure: LAPAROSCOPIC LYSIS OF ADHESIONS;  Surgeon: Adin Hector, MD;  [facility];  Service: General;  Laterality: N/A;  . LAPAROTOMY N/A 07/30/2017   Procedure: EXPLORATORY LAPAROTOMY;  Surgeon: Jovita Kussmaul, MD;  [facility];  Service: General;  Laterality: N/A;  . LYSIS OF ADHESION N/A 07/30/2017   Procedure: EXTENSIVE LYSIS OF ADHESION;  Surgeon: Jovita Kussmaul, MD;  [facility];  Service: General;  Laterality: N/A;  . OMENTECTOMY N/A 07/30/2017   Procedure: PARTIAL OMENTECTOMY;  Surgeon: Jovita Kussmaul, MD;  [facility];  Service: General;   Laterality: N/A;  . VENTRAL HERNIA REPAIR N/A 07/30/2017   Procedure: HERNIA REPAIR VENTRAL ADULT;  Surgeon: Jovita Kussmaul, MD;  [facility];  Service: General;  Laterality: N/A;   Social History   Occupational History  . Not on file  Tobacco Use  . Smoking status: Never Smoker  . Smokeless tobacco: Never Used  Vaping Use  . Vaping Use: Never used  Substance and Sexual Activity  . Alcohol use: No  . Drug use: No  . Sexual activity: Yes

## 2019-11-30 ENCOUNTER — Ambulatory Visit (INDEPENDENT_AMBULATORY_CARE_PROVIDER_SITE_OTHER): Payer: Medicare Other | Admitting: Physician Assistant

## 2019-11-30 ENCOUNTER — Encounter: Payer: Self-pay | Admitting: Physician Assistant

## 2019-11-30 VITALS — Ht 64.0 in | Wt 236.0 lb

## 2019-11-30 DIAGNOSIS — M86172 Other acute osteomyelitis, left ankle and foot: Secondary | ICD-10-CM

## 2019-11-30 NOTE — Progress Notes (Signed)
Office Visit Note   Patient: Debra [name]           Date of Birth: Dec 21, 1945           MRN: [id number] Visit Date: 11/30/2019              Requested by: Vicenta Aly, [place],  [place] [address] PCP: Vicenta Aly, FNP  Chief Complaint  Patient presents with  . Left Foot - Follow-up    Calcaneal osteomyelitis       HPI: This is a pleasant 74 year old woman who we have been following for osteomyelitis of her calcaneus.  She has been seeing infectious disease.  Currently they have her on Keflex.  She is noticed a decrease in the amount of fluid coming out but decrease in pain until yesterday.  She states she had to be on her feet an hour at a drugstore waiting for her glucometer to be fixed.  Since then it painful and sore.  Assessment & Plan: Visit Diagnoses: No diagnosis found.  Plan: She will continue to do daily cleansing and dressing changes.  I have asked that she try to go home and elevate her feet this weekend and she is planning on doing this.  Follow-up in 1 week  Follow-Up Instructions: No follow-ups on file.   Ortho Exam  Patient is alert, oriented, no adenopathy, well-dressed, normal affect, normal respiratory effort. Focused examination of her foot she does not have any erythema or cellulitis.  Small half centimeter diameter opening on the bottom of the heel I am able to express some serous drainage.  There is no purulence no foul odor.  She does not have any surrounding cellulitis.  I did trim away some of the macerated skin  Imaging: No results found. No images are attached to the encounter.  Labs: Lab Results  Component Value Date   HGBA1C 8.6 (H) 07/22/2019   HGBA1C 8.9 (H) 07/27/2017   HGBA1C 7.6 (H) 05/22/2016   ESRSEDRATE 36 (H) 11/20/2019   ESRSEDRATE 60 (H) 07/28/2019   ESRSEDRATE 22 07/10/2019   CRP 10.9 (H) 11/20/2019   CRP 10.1 (H) 07/28/2019   CRP 3.8 07/10/2019   LABURIC 5.5 11/29/2014   LABURIC 4.6  10/01/2014   LABURIC 5.9 06/09/2013   REPTSTATUS 07/30/2019 FINAL 07/26/2019   GRAMSTAIN  07/26/2019    ABUNDANT WBC PRESENT,BOTH PMN AND MONONUCLEAR ABUNDANT GRAM POSITIVE COCCI FEW GRAM NEGATIVE RODS    CULT  07/26/2019    ABUNDANT GROUP B STREP(S.AGALACTIAE)ISOLATED TESTING AGAINST S. AGALACTIAE NOT ROUTINELY PERFORMED DUE TO PREDICTABILITY OF AMP/PEN/VAN SUSCEPTIBILITY. FEW STAPHYLOCOCCUS HAEMOLYTICUS ABUNDANT BACTEROIDES FRAGILIS BETA LACTAMASE POSITIVE Performed at [facility], [place] [address], [place], [place] [address]    LABORGA STAPHYLOCOCCUS HAEMOLYTICUS 07/26/2019     Lab Results  Component Value Date   ALBUMIN 3.2 (L) 07/22/2019   ALBUMIN 2.4 (L) 08/02/2017   ALBUMIN 3.1 (L) 07/28/2017   LABURIC 5.5 11/29/2014   LABURIC 4.6 10/01/2014   LABURIC 5.9 06/09/2013    Lab Results  Component Value Date   MG 1.2 (L) 08/02/2017   MG 1.6 (L) 07/29/2017   MG 1.3 (L) 07/27/2017   Lab Results  Component Value Date   VD25OH 28.7 (L) 11/28/2012    No results found for: PREALBUMIN CBC EXTENDED Latest Ref Rng & Units 11/20/2019 07/31/2019 07/30/2019  WBC 3.8 - 10.8 Thousand/uL 5.5 6.9 8.0  RBC 3.80 - 5.10 Million/uL 3.53(L) 3.59(L) 3.58(L)  HGB 11.7 -  15.5 g/dL 10.7(L) 10.6(L) 10.3(L)  HCT 35 - 45 % 32.6(L) 33.1(L) 33.0(L)  PLT 140 - 400 Thousand/uL 282 298 296  NEUTROABS 1,500 - 7,800 cells/uL 2,602 - -  LYMPHSABS 850 - 3,900 cells/uL 1,771 - -     Body mass index is 40.51 kg/m.  Orders:  No orders of the defined types were placed in this encounter.  No orders of the defined types were placed in this encounter.    Procedures: No procedures performed  Clinical Data: No additional findings.  ROS:  All other systems negative, except as noted in the HPI. Review of Systems  Objective: Vital Signs: Ht 5\' 4"  (1.626 m)   Wt 236 lb (107 kg)   LMP 07/02/1973   BMI 40.51 kg/m   Specialty Comments:  No specialty comments available.  PMFS  History: Patient Active Problem List   Diagnosis Date Noted  . Cutaneous abscess of left foot   . Osteomyelitis ([place]) 07/22/2019  . Diabetic foot infection ([place]) 07/10/2019  . Acute osteomyelitis of left calcaneus ([place]) 07/10/2019  . Group B streptococcal infection 07/10/2019  . 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 Pseudogout of right knee 08/02/2017  . SBO (small bowel obstruction) ([place]) 07/22/2017  . Abnormal LFTs 03/20/2016  . Abdominal pain, left lower quadrant 07/04/2015  . Type 2 diabetes mellitus with diabetic polyneuropathy ([place]) 10/06/2013  . Hyperlipidemia associated with type 2 diabetes mellitus ([place]) 10/06/2013  . Fungal dermatitis 08/11/2013  . Hyperlipidemia 06/09/2013  . Choledocholithiasis with acute cholecystitis 04/14/2013  . Symptomatic cholelithiasis 04/13/2013  . Neuropathic pain of both legs 04/04/2013  . Severe obesity (BMI >= 40) ([place]) 11/28/2012  . Obesity (BMI 30-39.9) 11/28/2012  . Diabetes mellitus with neurological manifestation ([place])   . Hypertension goal BP (blood pressure) < 130/80   . Osteopenia   . Chronic venous insufficiency   . Leg edema   . Hypertensive retinopathy, grade 2 03/30/2012  . Medial epicondylitis 02/11/2012  . Superficial thrombophlebitis of left leg 07/15/2011  . GERD (gastroesophageal reflux disease) 02/25/2011  . Right knee pain 02/16/2011  . Preventive measure 09/11/2010  . Neck pain 07/03/2010  . Grieving 04/17/2010  . OSTEOPENIA 04/12/2006  . Controlled type 2 diabetes mellitus with complication, with long-term current use of insulin ([place]) 03/08/2006  . MORBID OBESITY 03/08/2006  . Essential hypertension 03/08/2006   Past Medical History:  Diagnosis Date  . Acute osteomyelitis of calcaneum, left ([place]) 07/10/2019  . Cellulitis of left leg    history of, most recent episode 01/09  . Chronic venous insufficiency   . Diabetes mellitus with neurological manifestation ([place])   . Diabetic foot  infection ([place]) 07/10/2019  . Diverticulitis    h/o 2-3 episodes in past  . Group B streptococcal infection 07/10/2019  . Hyperlipidemia LDL goal < 100   . Hypertension goal BP (blood pressure) < 130/80   . Leg edema    secondary to chronic venous insufficiency  . Morbid obesity ([place])   . Osteopenia    DEXA scan 7/07  . Pseudomonas aeruginosa infection 07/10/2019    Family History  Problem Relation Age of Onset  . Heart disease Mother   . Heart disease Father   . Heart disease Brother   . Cancer Brother   . Cancer Brother   . Hypertension Sister   . Heart disease Sister     Past Surgical History:  Procedure Laterality Date  . ABDOMINAL HYSTERECTOMY    . APPENDECTOMY    .  CHOLECYSTECTOMY    . CHOLECYSTECTOMY N/A 04/14/2013   Procedure: LAPAROSCOPIC CHOLECYSTECTOMY WITH INTRAOPERATIVE CHOLANGIOGRAM;  Surgeon: Adin Hector, MD;  [facility];  Service: General;  Laterality: N/A;  . COLECTOMY     with diverting colsotmy and revision in the 1990's for diverticulitis  . ERCP N/A 04/17/2013   Procedure: ENDOSCOPIC RETROGRADE CHOLANGIOPANCREATOGRAPHY (ERCP);  Surgeon: Irene Shipper, MD;  Location[facility];  Service: Endoscopy;  Laterality: N/A;  note pt want general anesthesia for this case.  preferto perform in endo unit, but if unavailable please arrange time in OR  . EYE SURGERY  8/13   left cataract removal & retinal repair  . I & D EXTREMITY Left 07/26/2019   Procedure: LEFT PARTIAL CALCANEOUS EXCISION;  Surgeon: [name], MD;  Location: [facility];  Service: Orthopedics;  Laterality: Left;  . INSERTION OF MESH N/A 07/30/2017   Procedure: INSERTION OF MESH;  Surgeon: Jovita Kussmaul, MD;  [facility];  Service: General;  Laterality: N/A;  . LAPAROSCOPIC LYSIS OF ADHESIONS N/A 04/14/2013   Procedure: LAPAROSCOPIC LYSIS OF ADHESIONS;  Surgeon: Adin Hector, MD;  [facility];  Service: General;  Laterality: N/A;  . LAPAROTOMY N/A 07/30/2017   Procedure:  EXPLORATORY LAPAROTOMY;  Surgeon: Jovita Kussmaul, MD;  [facility];  Service: General;  Laterality: N/A;  . LYSIS OF ADHESION N/A 07/30/2017   Procedure: EXTENSIVE LYSIS OF ADHESION;  Surgeon: Jovita Kussmaul, MD;  [facility];  Service: General;  Laterality: N/A;  . OMENTECTOMY N/A 07/30/2017   Procedure: PARTIAL OMENTECTOMY;  Surgeon: Jovita Kussmaul, MD;  [facility];  Service: General;  Laterality: N/A;  . VENTRAL HERNIA REPAIR N/A 07/30/2017   Procedure: HERNIA REPAIR VENTRAL ADULT;  Surgeon: Jovita Kussmaul, MD;  [facility];  Service: General;  Laterality: N/A;   Social History   Occupational History  . Not on file  Tobacco Use  . Smoking status: Never Smoker  . Smokeless tobacco: Never Used  Vaping Use  . Vaping Use: Never used  Substance and Sexual Activity  . Alcohol use: No  . Drug use: No  . Sexual activity: Yes

## 2019-12-07 ENCOUNTER — Encounter: Payer: Self-pay | Admitting: Physician Assistant

## 2019-12-07 ENCOUNTER — Other Ambulatory Visit: Payer: Self-pay

## 2019-12-07 ENCOUNTER — Ambulatory Visit (INDEPENDENT_AMBULATORY_CARE_PROVIDER_SITE_OTHER): Payer: Medicare Other | Admitting: Physician Assistant

## 2019-12-07 DIAGNOSIS — M8600 Acute hematogenous osteomyelitis, unspecified site: Secondary | ICD-10-CM

## 2019-12-07 NOTE — Progress Notes (Signed)
Office Visit Note   Patient: Debra [name]           Date of Birth: [birth year]           MRN: [id number] Visit Date: 12/07/2019              Requested by: Vicenta Aly, [place],  [place] [address] PCP: Vicenta Aly, FNP  Chief Complaint  Patient presents with  . Left Foot - Pain, Follow-up      HPI: A pleasant 74 year old woman who is follow-up today for her left heel.  She has a history of calcaneal osteomyelitis.  She has been on chronic antibiotics.  She did have some increase in pain last week but have been on her feet waiting in a drugstore for a repair to her glucometer.  She feels she is much better.  She is only getting clear drainage and is beginning to slow down  Assessment & Plan: Visit Diagnoses: No diagnosis found.  Plan: Continue antibiotics as prescribed by infectious disease follow-up in 1 week.  Continue cleansing and daily dressing changes  Follow-Up Instructions: No follow-ups on file.   Ortho Exam  Patient is alert, oriented, no adenopathy, well-dressed, normal affect, normal respiratory effort. Focused examination of the plantar surface of the left heel there is 1/2 cm diameter hole.  There is some clear serous drainage.  No foul odor no surrounding cellulitis no purulence  Imaging: No results found. No images are attached to the encounter.  Labs: Lab Results  Component Value Date   HGBA1C 8.6 (H) 07/22/2019   HGBA1C 8.9 (H) 07/27/2017   HGBA1C 7.6 (H) 05/22/2016   ESRSEDRATE 36 (H) 11/20/2019   ESRSEDRATE 60 (H) 07/28/2019   ESRSEDRATE 22 07/10/2019   CRP 10.9 (H) 11/20/2019   CRP 10.1 (H) 07/28/2019   CRP 3.8 07/10/2019   LABURIC 5.5 11/29/2014   LABURIC 4.6 10/01/2014   LABURIC 5.9 06/09/2013   REPTSTATUS 07/30/2019 FINAL 07/26/2019   GRAMSTAIN  07/26/2019    ABUNDANT WBC PRESENT,BOTH PMN AND MONONUCLEAR ABUNDANT GRAM POSITIVE COCCI FEW GRAM NEGATIVE RODS    CULT  07/26/2019    ABUNDANT GROUP B  STREP(S.AGALACTIAE)ISOLATED TESTING AGAINST S. AGALACTIAE NOT ROUTINELY PERFORMED DUE TO PREDICTABILITY OF AMP/PEN/VAN SUSCEPTIBILITY. FEW STAPHYLOCOCCUS HAEMOLYTICUS ABUNDANT BACTEROIDES FRAGILIS BETA LACTAMASE POSITIVE Performed at [facility], [place] [address] [place], [place] [address]    LABORGA STAPHYLOCOCCUS HAEMOLYTICUS 07/26/2019     Lab Results  Component Value Date   ALBUMIN 3.2 (L) 07/22/2019   ALBUMIN 2.4 (L) 08/02/2017   ALBUMIN 3.1 (L) 07/28/2017   LABURIC 5.5 11/29/2014   LABURIC 4.6 10/01/2014   LABURIC 5.9 06/09/2013    Lab Results  Component Value Date   MG 1.2 (L) 08/02/2017   MG 1.6 (L) 07/29/2017   MG 1.3 (L) 07/27/2017   Lab Results  Component Value Date   VD25OH 28.7 (L) 11/28/2012    No results found for: PREALBUMIN CBC EXTENDED Latest Ref Rng & Units 11/20/2019 07/31/2019 07/30/2019  WBC 3.8 - 10.8 Thousand/uL 5.5 6.9 8.0  RBC 3.80 - 5.10 Million/uL 3.53(L) 3.59(L) 3.58(L)  HGB 11.7 - 15.5 g/dL 10.7(L) 10.6(L) 10.3(L)  HCT 35 - 45 % 32.6(L) 33.1(L) 33.0(L)  PLT 140 - 400 Thousand/uL 282 298 296  NEUTROABS 1,500 - 7,800 cells/uL 2,602 - -  LYMPHSABS 850 - 3,900 cells/uL 1,771 - -     There is no height or weight on file to calculate BMI.  Orders:  No orders of the defined types were placed in this encounter.  No orders of the defined types were placed in this encounter.    Procedures: No procedures performed  Clinical Data: No additional findings.  ROS:  All other systems negative, except as noted in the HPI. Review of Systems  Objective: Vital Signs: LMP 07/02/1973   Specialty Comments:  No specialty comments available.  PMFS History: Patient Active Problem List   Diagnosis Date Noted  . Cutaneous abscess of left foot   . Osteomyelitis ([place]) 07/22/2019  . Diabetic foot infection ([place]) 07/10/2019  . Acute osteomyelitis of left calcaneus ([place]) 07/10/2019  . Group B streptococcal infection 07/10/2019  . Bacterial  infection due to Morganella morganii 07/10/2019  . Pseudogout of right knee 08/02/2017  . SBO (small bowel obstruction) ([place]) 07/22/2017  . Abnormal LFTs 03/20/2016  . Abdominal pain, left lower quadrant 07/04/2015  . Type 2 diabetes mellitus with diabetic polyneuropathy ([place]) 10/06/2013  . Hyperlipidemia associated with type 2 diabetes mellitus ([place]) 10/06/2013  . Fungal dermatitis 08/11/2013  . Hyperlipidemia 06/09/2013  . Choledocholithiasis with acute cholecystitis 04/14/2013  . Symptomatic cholelithiasis 04/13/2013  . Neuropathic pain of both legs 04/04/2013  . Severe obesity (BMI >= 40) ([place]) 11/28/2012  . Obesity (BMI 30-39.9) 11/28/2012  . Diabetes mellitus with neurological manifestation ([place])   . Hypertension goal BP (blood pressure) < 130/80   . Osteopenia   . Chronic venous insufficiency   . Leg edema   . Hypertensive retinopathy, grade 2 03/30/2012  . Medial epicondylitis 02/11/2012  . Superficial thrombophlebitis of left leg 07/15/2011  . GERD (gastroesophageal reflux disease) 02/25/2011  . Right knee pain 02/16/2011  . Preventive measure 09/11/2010  . Neck pain 07/03/2010  . Grieving 04/17/2010  . OSTEOPENIA 04/12/2006  . Controlled type 2 diabetes mellitus with complication, with long-term current use of insulin ([place]) 03/08/2006  . MORBID OBESITY 03/08/2006  . Essential hypertension 03/08/2006   Past Medical History:  Diagnosis Date  . Acute osteomyelitis of calcaneum, left ([place]) 07/10/2019  . Cellulitis of left leg    history of, most recent episode 01/09  . Chronic venous insufficiency   . Diabetes mellitus with neurological manifestation ([place])   . Diabetic foot infection ([place]) 07/10/2019  . Diverticulitis    h/o 2-3 episodes in past  . Group B streptococcal infection 07/10/2019  . Hyperlipidemia LDL goal < 100   . Hypertension goal BP (blood pressure) < 130/80   . Leg edema    secondary to chronic venous insufficiency  . Morbid obesity ([place])   .  Osteopenia    DEXA scan 7/07  . Pseudomonas aeruginosa infection 07/10/2019    Family History  Problem Relation Age of Onset  . Heart disease Mother   . Heart disease Father   . Heart disease Brother   . Cancer Brother   . Cancer Brother   . Hypertension Sister   . Heart disease Sister     Past Surgical History:  Procedure Laterality Date  . ABDOMINAL HYSTERECTOMY    . APPENDECTOMY    . CHOLECYSTECTOMY    . CHOLECYSTECTOMY N/A 04/14/2013   Procedure: LAPAROSCOPIC CHOLECYSTECTOMY WITH INTRAOPERATIVE CHOLANGIOGRAM;  Surgeon: Adin Hector, MD;  Location: [facility];  Service: General;  Laterality: N/A;  . COLECTOMY     with diverting colsotmy and revision in the 1990's for diverticulitis  . ERCP N/A 04/17/2013   Procedure: ENDOSCOPIC RETROGRADE CHOLANGIOPANCREATOGRAPHY (ERCP);  Surgeon: Irene Shipper, MD;  Location: [facility];  Service: Endoscopy;  Laterality: N/A;  note pt want general anesthesia for this case.  preferto perform in endo unit, but if unavailable please arrange time in OR  . EYE SURGERY  8/13   left cataract removal & retinal repair  . I & D EXTREMITY Left 07/26/2019   Procedure: LEFT PARTIAL CALCANEOUS EXCISION;  Surgeon: [name], MD;  Location: [facility];  Service: Orthopedics;  Laterality: Left;  . INSERTION OF MESH N/A 07/30/2017   Procedure: INSERTION OF MESH;  Surgeon: Jovita Kussmaul, MD;  [facility];  Service: General;  Laterality: N/A;  . LAPAROSCOPIC LYSIS OF ADHESIONS N/A 04/14/2013   Procedure: LAPAROSCOPIC LYSIS OF ADHESIONS;  Surgeon: Adin Hector, MD;  [facility];  Service: General;  Laterality: N/A;  . LAPAROTOMY N/A 07/30/2017   Procedure: EXPLORATORY LAPAROTOMY;  Surgeon: Jovita Kussmaul, MD;  [facility];  Service: General;  Laterality: N/A;  . LYSIS OF ADHESION N/A 07/30/2017   Procedure: EXTENSIVE LYSIS OF ADHESION;  Surgeon: Jovita Kussmaul, MD;  [facility];  Service: General;  Laterality: N/A;  . OMENTECTOMY N/A 07/30/2017    Procedure: PARTIAL OMENTECTOMY;  Surgeon: Jovita Kussmaul, MD;  [facility];  Service: General;  Laterality: N/A;  . VENTRAL HERNIA REPAIR N/A 07/30/2017   Procedure: HERNIA REPAIR VENTRAL ADULT;  Surgeon: Jovita Kussmaul, MD;  [facility];  Service: General;  Laterality: N/A;   Social History   Occupational History  . Not on file  Tobacco Use  . Smoking status: Never Smoker  . Smokeless tobacco: Never Used  Vaping Use  . Vaping Use: Never used  Substance and Sexual Activity  . Alcohol use: No  . Drug use: No  . Sexual activity: Yes

## 2019-12-11 ENCOUNTER — Ambulatory Visit: Payer: Medicare Other | Admitting: Infectious Disease

## 2019-12-12 ENCOUNTER — Telehealth: Payer: Self-pay

## 2019-12-12 ENCOUNTER — Telehealth: Payer: Self-pay | Admitting: Orthopedic Surgery

## 2019-12-12 NOTE — Telephone Encounter (Signed)
Pt would like a CB she states she's having some issues with her foot.  [phone]

## 2019-12-12 NOTE — Telephone Encounter (Signed)
Called pt and she has bloody drainage and is concerned bc she has not experienced this before. Made an appt for follow up tomorrow at 10:30.

## 2019-12-12 NOTE — Telephone Encounter (Signed)
Patient called stating that she is having some watery, bloody drainage from her left foot.  Stated that the bloody drainage started last night.  Patient has an appointment on Friday, 12/15/2019.  Would like to know what she needs to do?  Cb# [phone].  Please advise.

## 2019-12-12 NOTE — Telephone Encounter (Signed)
Already called and sw pt and moved appt up to tomorrow at 10:30

## 2019-12-13 ENCOUNTER — Encounter: Payer: Self-pay | Admitting: Physician Assistant

## 2019-12-13 ENCOUNTER — Ambulatory Visit (INDEPENDENT_AMBULATORY_CARE_PROVIDER_SITE_OTHER): Payer: Medicare Other | Admitting: Physician Assistant

## 2019-12-13 VITALS — Ht 64.0 in | Wt 236.0 lb

## 2019-12-13 DIAGNOSIS — M8600 Acute hematogenous osteomyelitis, unspecified site: Secondary | ICD-10-CM

## 2019-12-13 NOTE — Progress Notes (Signed)
Office Visit Note   Patient: Debra [name]           Date of Birth: [birth year]           MRN: [id number] Visit Date: 12/13/2019              Requested by: Vicenta Aly, [place],  [place] [address] PCP: Vicenta Aly, FNP  Chief Complaint  Patient presents with  . Left Foot - Follow-up      HPI: Patient has a history of chronic osteomyelitis of her calcaneus.  She is on antibiotics.  She had been doing well however she did get concerned because she had some increased bloody drainage on the bandage.  She denies any increase in pain.  She denies any fever or chills.  The only thing that is changed that she has been taking aspirin for headaches she has been having  Assessment & Plan: Visit Diagnoses: No diagnosis found.  Plan: Continue with dressing changes.  May cancel her appointment for Friday and follow-up with Dr. Sharol Given who she has not seen in a while next week  Follow-Up Instructions: No follow-ups on file.   Ortho Exam  Patient is alert, oriented, no adenopathy, well-dressed, normal affect, normal respiratory effort. Focused examination demonstrates no new erythema cellulitis.  No foul odor.  The open area on the bottom of her heel today is draining just serous fluid.  Cannot express any purulence.  Nontender to palpation.  No ascending cellulitis  Imaging: No results found. No images are attached to the encounter.  Labs: Lab Results  Component Value Date   HGBA1C 8.6 (H) 07/22/2019   HGBA1C 8.9 (H) 07/27/2017   HGBA1C 7.6 (H) 05/22/2016   ESRSEDRATE 36 (H) 11/20/2019   ESRSEDRATE 60 (H) 07/28/2019   ESRSEDRATE 22 07/10/2019   CRP 10.9 (H) 11/20/2019   CRP 10.1 (H) 07/28/2019   CRP 3.8 07/10/2019   LABURIC 5.5 11/29/2014   LABURIC 4.6 10/01/2014   LABURIC 5.9 06/09/2013   REPTSTATUS 07/30/2019 FINAL 07/26/2019   GRAMSTAIN  07/26/2019    ABUNDANT WBC PRESENT,BOTH PMN AND MONONUCLEAR ABUNDANT GRAM POSITIVE COCCI FEW GRAM  NEGATIVE RODS    CULT  07/26/2019    ABUNDANT GROUP B STREP(S.AGALACTIAE)ISOLATED TESTING AGAINST S. AGALACTIAE NOT ROUTINELY PERFORMED DUE TO PREDICTABILITY OF AMP/PEN/VAN SUSCEPTIBILITY. FEW STAPHYLOCOCCUS HAEMOLYTICUS ABUNDANT BACTEROIDES FRAGILIS BETA LACTAMASE POSITIVE Performed at [facility], [place] [address] [place], [place] [address]    LABORGA STAPHYLOCOCCUS HAEMOLYTICUS 07/26/2019     Lab Results  Component Value Date   ALBUMIN 3.2 (L) 07/22/2019   ALBUMIN 2.4 (L) 08/02/2017   ALBUMIN 3.1 (L) 07/28/2017   LABURIC 5.5 11/29/2014   LABURIC 4.6 10/01/2014   LABURIC 5.9 06/09/2013    Lab Results  Component Value Date   MG 1.2 (L) 08/02/2017   MG 1.6 (L) 07/29/2017   MG 1.3 (L) 07/27/2017   Lab Results  Component Value Date   VD25OH 28.7 (L) 11/28/2012    No results found for: PREALBUMIN CBC EXTENDED Latest Ref Rng & Units 11/20/2019 07/31/2019 07/30/2019  WBC 3.8 - 10.8 Thousand/uL 5.5 6.9 8.0  RBC 3.80 - 5.10 Million/uL 3.53(L) 3.59(L) 3.58(L)  HGB 11.7 - 15.5 g/dL 10.7(L) 10.6(L) 10.3(L)  HCT 35 - 45 % 32.6(L) 33.1(L) 33.0(L)  PLT 140 - 400 Thousand/uL 282 298 296  NEUTROABS 1,500 - 7,800 cells/uL 2,602 - -  LYMPHSABS 850 - 3,900 cells/uL 1,771 - -  Body mass index is 40.51 kg/m.  Orders:  No orders of the defined types were placed in this encounter.  No orders of the defined types were placed in this encounter.    Procedures: No procedures performed  Clinical Data: No additional findings.  ROS:  All other systems negative, except as noted in the HPI. Review of Systems  Objective: Vital Signs: Ht 5\' 4"  (1.626 m)   Wt 236 lb (107 kg)   LMP 07/02/1973   BMI 40.51 kg/m   Specialty Comments:  No specialty comments available.  PMFS History: Patient Active Problem List   Diagnosis Date Noted  . Cutaneous abscess of left foot   . Osteomyelitis ([place]) 07/22/2019  . Diabetic foot infection ([place]) 07/10/2019  . Acute osteomyelitis  of left calcaneus ([place]) 07/10/2019  . Group B streptococcal infection 07/10/2019  . 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 Pseudogout of right knee 08/02/2017  . SBO (small bowel obstruction) ([place]) 07/22/2017  . Abnormal LFTs 03/20/2016  . Abdominal pain, left lower quadrant 07/04/2015  . Type 2 diabetes mellitus with diabetic polyneuropathy ([place]) 10/06/2013  . Hyperlipidemia associated with type 2 diabetes mellitus ([place]) 10/06/2013  . Fungal dermatitis 08/11/2013  . Hyperlipidemia 06/09/2013  . Choledocholithiasis with acute cholecystitis 04/14/2013  . Symptomatic cholelithiasis 04/13/2013  . Neuropathic pain of both legs 04/04/2013  . Severe obesity (BMI >= 40) ([place]) 11/28/2012  . Obesity (BMI 30-39.9) 11/28/2012  . Diabetes mellitus with neurological manifestation ([place])   . Hypertension goal BP (blood pressure) < 130/80   . Osteopenia   . Chronic venous insufficiency   . Leg edema   . Hypertensive retinopathy, grade 2 03/30/2012  . Medial epicondylitis 02/11/2012  . Superficial thrombophlebitis of left leg 07/15/2011  . GERD (gastroesophageal reflux disease) 02/25/2011  . Right knee pain 02/16/2011  . Preventive measure 09/11/2010  . Neck pain 07/03/2010  . Grieving 04/17/2010  . OSTEOPENIA 04/12/2006  . Controlled type 2 diabetes mellitus with complication, with long-term current use of insulin ([place]) 03/08/2006  . MORBID OBESITY 03/08/2006  . Essential hypertension 03/08/2006   Past Medical History:  Diagnosis Date  . Acute osteomyelitis of calcaneum, left ([place]) 07/10/2019  . Cellulitis of left leg    history of, most recent episode 01/09  . Chronic venous insufficiency   . Diabetes mellitus with neurological manifestation ([place])   . Diabetic foot infection ([place]) 07/10/2019  . Diverticulitis    h/o 2-3 episodes in past  . Group B streptococcal infection 07/10/2019  . Hyperlipidemia LDL goal < 100   . Hypertension goal BP (blood pressure) < 130/80     . Leg edema    secondary to chronic venous insufficiency  . Morbid obesity ([place])   . Osteopenia    DEXA scan 7/07  . Pseudomonas aeruginosa infection 07/10/2019    Family History  Problem Relation Age of Onset  . Heart disease Mother   . Heart disease Father   . Heart disease Brother   . Cancer Brother   . Cancer Brother   . Hypertension Sister   . Heart disease Sister     Past Surgical History:  Procedure Laterality Date  . ABDOMINAL HYSTERECTOMY    . APPENDECTOMY    . CHOLECYSTECTOMY    . CHOLECYSTECTOMY N/A 04/14/2013   Procedure: LAPAROSCOPIC CHOLECYSTECTOMY WITH INTRAOPERATIVE CHOLANGIOGRAM;  Surgeon: Adin Hector, MD;  [facility];  Service: General;  Laterality: N/A;  . COLECTOMY     with diverting colsotmy and  revision in the 1990's for diverticulitis  . ERCP N/A 04/17/2013   Procedure: ENDOSCOPIC RETROGRADE CHOLANGIOPANCREATOGRAPHY (ERCP);  Surgeon: Irene Shipper, MD;  Location[facility];  Service: Endoscopy;  Laterality: N/A;  note pt want general anesthesia for this case.  preferto perform in endo unit, but if unavailable please arrange time in OR  . EYE SURGERY  8/13   left cataract removal & retinal repair  . I & D EXTREMITY Left 07/26/2019   Procedure: LEFT PARTIAL CALCANEOUS EXCISION;  Surgeon: [name], MD;  Location: [facility];  Service: Orthopedics;  Laterality: Left;  . INSERTION OF MESH N/A 07/30/2017   Procedure: INSERTION OF MESH;  Surgeon: Jovita Kussmaul, MD;  [facility];  Service: General;  Laterality: N/A;  . LAPAROSCOPIC LYSIS OF ADHESIONS N/A 04/14/2013   Procedure: LAPAROSCOPIC LYSIS OF ADHESIONS;  Surgeon: Adin Hector, MD;  [facility];  Service: General;  Laterality: N/A;  . LAPAROTOMY N/A 07/30/2017   Procedure: EXPLORATORY LAPAROTOMY;  Surgeon: Jovita Kussmaul, MD;  [facility];  Service: General;  Laterality: N/A;  . LYSIS OF ADHESION N/A 07/30/2017   Procedure: EXTENSIVE LYSIS OF ADHESION;  Surgeon: Jovita Kussmaul,  MD;  [facility];  Service: General;  Laterality: N/A;  . OMENTECTOMY N/A 07/30/2017   Procedure: PARTIAL OMENTECTOMY;  Surgeon: Jovita Kussmaul, MD;  [facility];  Service: General;  Laterality: N/A;  . VENTRAL HERNIA REPAIR N/A 07/30/2017   Procedure: HERNIA REPAIR VENTRAL ADULT;  Surgeon: Jovita Kussmaul, MD;  [facility];  Service: General;  Laterality: N/A;   Social History   Occupational History  . Not on file  Tobacco Use  . Smoking status: Never Smoker  . Smokeless tobacco: Never Used  Vaping Use  . Vaping Use: Never used  Substance and Sexual Activity  . Alcohol use: No  . Drug use: No  . Sexual activity: Yes

## 2019-12-15 ENCOUNTER — Ambulatory Visit: Payer: Medicare Other | Admitting: Family

## 2019-12-18 ENCOUNTER — Encounter: Payer: Self-pay | Admitting: Orthopedic Surgery

## 2019-12-18 ENCOUNTER — Ambulatory Visit (INDEPENDENT_AMBULATORY_CARE_PROVIDER_SITE_OTHER): Payer: Medicare Other | Admitting: Orthopedic Surgery

## 2019-12-18 VITALS — Ht 64.0 in | Wt 236.0 lb

## 2019-12-18 DIAGNOSIS — M86172 Other acute osteomyelitis, left ankle and foot: Secondary | ICD-10-CM | POA: Diagnosis not present

## 2019-12-18 NOTE — Progress Notes (Signed)
Office Visit Note   Patient: Debra [name]           Date of Birth: [birth year]           MRN: [id number] Visit Date: 12/18/2019              Requested by: Vicenta Aly, [place] [place],  [place] [address] PCP: Vicenta Aly, FNP  Chief Complaint  Patient presents with  . Left Foot - Follow-up      HPI: Patient is 4 months status post partial excision left calcaneus for osteomyelitis.  Patient's wound had healed up well however recently patient has developed a small draining sinus tract.  She is currently on Keflex and is off Bactrim DS.  Patient states she did have increased bleeding when she was taking aspirins for her headache she states she stopped the aspirin and the drainage has decreased.  Assessment & Plan: Visit Diagnoses:  1. Acute osteomyelitis of left calcaneus (HCC)     Plan: Recommended elevation and calf pumps to help decrease the swelling.  Continue with dry dressing follow-up in 2 weeks.  Follow-Up Instructions: Return in about 2 weeks (around 01/01/2020).   Ortho Exam  Patient is alert, oriented, no adenopathy, well-dressed, normal affect, normal respiratory effort. Examination patient has massive venous stasis swelling with venous ulcers in the left lower extremity the calf is 49 cm in circumference.  Examination of the left heel there is no cellulitis there is clear serosanguineous drainage the ulcer probes 2 cm deep and is 5 mm in diameter.  The previous surgical incision has healed nicely there is no redness no cellulitis no purulent drainage.  Imaging: No results found. No images are attached to the encounter.  Labs: Lab Results  Component Value Date   HGBA1C 8.6 (H) 07/22/2019   HGBA1C 8.9 (H) 07/27/2017   HGBA1C 7.6 (H) 05/22/2016   ESRSEDRATE 36 (H) 11/20/2019   ESRSEDRATE 60 (H) 07/28/2019   ESRSEDRATE 22 07/10/2019   CRP 10.9 (H) 11/20/2019   CRP 10.1 (H) 07/28/2019   CRP 3.8 07/10/2019   LABURIC 5.5  11/29/2014   LABURIC 4.6 10/01/2014   LABURIC 5.9 06/09/2013   REPTSTATUS 07/30/2019 FINAL 07/26/2019   GRAMSTAIN  07/26/2019    ABUNDANT WBC PRESENT,BOTH PMN AND MONONUCLEAR ABUNDANT GRAM POSITIVE COCCI FEW GRAM NEGATIVE RODS    CULT  07/26/2019    ABUNDANT GROUP B STREP(S.AGALACTIAE)ISOLATED TESTING AGAINST S. AGALACTIAE NOT ROUTINELY PERFORMED DUE TO PREDICTABILITY OF AMP/PEN/VAN SUSCEPTIBILITY. FEW STAPHYLOCOCCUS HAEMOLYTICUS ABUNDANT BACTEROIDES FRAGILIS BETA LACTAMASE POSITIVE Performed at [facility], [place] [address] [place], [place] [address]    LABORGA STAPHYLOCOCCUS HAEMOLYTICUS 07/26/2019     Lab Results  Component Value Date   ALBUMIN 3.2 (L) 07/22/2019   ALBUMIN 2.4 (L) 08/02/2017   ALBUMIN 3.1 (L) 07/28/2017   LABURIC 5.5 11/29/2014   LABURIC 4.6 10/01/2014   LABURIC 5.9 06/09/2013    Lab Results  Component Value Date   MG 1.2 (L) 08/02/2017   MG 1.6 (L) 07/29/2017   MG 1.3 (L) 07/27/2017   Lab Results  Component Value Date   VD25OH 28.7 (L) 11/28/2012    No results found for: PREALBUMIN CBC EXTENDED Latest Ref Rng & Units 11/20/2019 07/31/2019 07/30/2019  WBC 3.8 - 10.8 Thousand/uL 5.5 6.9 8.0  RBC 3.80 - 5.10 Million/uL 3.53(L) 3.59(L) 3.58(L)  HGB 11.7 - 15.5 g/dL 10.7(L) 10.6(L) 10.3(L)  HCT 35 - 45 % 32.6(L) 33.1(L) 33.0(L)  PLT 140 -  400 Thousand/uL 282 298 296  NEUTROABS 1,500 - 7,800 cells/uL 2,602 - -  LYMPHSABS 850 - 3,900 cells/uL 1,771 - -     Body mass index is 40.51 kg/m.  Orders:  No orders of the defined types were placed in this encounter.  No orders of the defined types were placed in this encounter.    Procedures: No procedures performed  Clinical Data: No additional findings.  ROS:  All other systems negative, except as noted in the HPI. Review of Systems  Objective: Vital Signs: Ht 5\' 4"  (1.626 m)   Wt 236 lb (107 kg)   LMP 07/02/1973   BMI 40.51 kg/m   Specialty Comments:  No specialty comments  available.  PMFS History: Patient Active Problem List   Diagnosis Date Noted  . Cutaneous abscess of left foot   . Osteomyelitis ([place]) 07/22/2019  . Diabetic foot infection ([place]) 07/10/2019  . Acute osteomyelitis of left calcaneus ([place]) 07/10/2019  . Group B streptococcal infection 07/10/2019  . 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 Pseudogout of right knee 08/02/2017  . SBO (small bowel obstruction) ([place]) 07/22/2017  . Abnormal LFTs 03/20/2016  . Abdominal pain, left lower quadrant 07/04/2015  . Type 2 diabetes mellitus with diabetic polyneuropathy ([place]) 10/06/2013  . Hyperlipidemia associated with type 2 diabetes mellitus ([place]) 10/06/2013  . Fungal dermatitis 08/11/2013  . Hyperlipidemia 06/09/2013  . Choledocholithiasis with acute cholecystitis 04/14/2013  . Symptomatic cholelithiasis 04/13/2013  . Neuropathic pain of both legs 04/04/2013  . Severe obesity (BMI >= 40) ([place]) 11/28/2012  . Obesity (BMI 30-39.9) 11/28/2012  . Diabetes mellitus with neurological manifestation ([place])   . Hypertension goal BP (blood pressure) < 130/80   . Osteopenia   . Chronic venous insufficiency   . Leg edema   . Hypertensive retinopathy, grade 2 03/30/2012  . Medial epicondylitis 02/11/2012  . Superficial thrombophlebitis of left leg 07/15/2011  . GERD (gastroesophageal reflux disease) 02/25/2011  . Right knee pain 02/16/2011  . Preventive measure 09/11/2010  . Neck pain 07/03/2010  . Grieving 04/17/2010  . OSTEOPENIA 04/12/2006  . Controlled type 2 diabetes mellitus with complication, with long-term current use of insulin ([place]) 03/08/2006  . MORBID OBESITY 03/08/2006  . Essential hypertension 03/08/2006   Past Medical History:  Diagnosis Date  . Acute osteomyelitis of calcaneum, left ([place]) 07/10/2019  . Cellulitis of left leg    history of, most recent episode 01/09  . Chronic venous insufficiency   . Diabetes mellitus with neurological manifestation ([place])   .  Diabetic foot infection ([place]) 07/10/2019  . Diverticulitis    h/o 2-3 episodes in past  . Group B streptococcal infection 07/10/2019  . Hyperlipidemia LDL goal < 100   . Hypertension goal BP (blood pressure) < 130/80   . Leg edema    secondary to chronic venous insufficiency  . Morbid obesity ([place])   . Osteopenia    DEXA scan 7/07  . Pseudomonas aeruginosa infection 07/10/2019    Family History  Problem Relation Age of Onset  . Heart disease Mother   . Heart disease Father   . Heart disease Brother   . Cancer Brother   . Cancer Brother   . Hypertension Sister   . Heart disease Sister     Past Surgical History:  Procedure Laterality Date  . ABDOMINAL HYSTERECTOMY    . APPENDECTOMY    . CHOLECYSTECTOMY    . CHOLECYSTECTOMY N/A 04/14/2013   Procedure: LAPAROSCOPIC CHOLECYSTECTOMY WITH INTRAOPERATIVE CHOLANGIOGRAM;  Surgeon: Adin Hector, MD;  Location: [facility];  Service: General;  Laterality: N/A;  . COLECTOMY     with diverting colsotmy and revision in the 1990's for diverticulitis  . ERCP N/A 04/17/2013   Procedure: ENDOSCOPIC RETROGRADE CHOLANGIOPANCREATOGRAPHY (ERCP);  Surgeon: Irene Shipper, MD;  Location: [facility];  Service: Endoscopy;  Laterality: N/A;  note pt want general anesthesia for this case.  preferto perform in endo unit, but if unavailable please arrange time in OR  . EYE SURGERY  8/13   left cataract removal & retinal repair  . I & D EXTREMITY Left 07/26/2019   Procedure: LEFT PARTIAL CALCANEOUS EXCISION;  Surgeon: [name], MD;  Location: [facility];  Service: Orthopedics;  Laterality: Left;  . INSERTION OF MESH N/A 07/30/2017   Procedure: INSERTION OF MESH;  Surgeon: Jovita Kussmaul, MD;  [facility];  Service: General;  Laterality: N/A;  . LAPAROSCOPIC LYSIS OF ADHESIONS N/A 04/14/2013   Procedure: LAPAROSCOPIC LYSIS OF ADHESIONS;  Surgeon: Adin Hector, MD;  [facility];  Service: General;  Laterality: N/A;  . LAPAROTOMY N/A 07/30/2017    Procedure: EXPLORATORY LAPAROTOMY;  Surgeon: Jovita Kussmaul, MD;  [facility];  Service: General;  Laterality: N/A;  . LYSIS OF ADHESION N/A 07/30/2017   Procedure: EXTENSIVE LYSIS OF ADHESION;  Surgeon: Jovita Kussmaul, MD;  [facility];  Service: General;  Laterality: N/A;  . OMENTECTOMY N/A 07/30/2017   Procedure: PARTIAL OMENTECTOMY;  Surgeon: Jovita Kussmaul, MD;  [facility];  Service: General;  Laterality: N/A;  . VENTRAL HERNIA REPAIR N/A 07/30/2017   Procedure: HERNIA REPAIR VENTRAL ADULT;  Surgeon: Jovita Kussmaul, MD;  [facility];  Service: General;  Laterality: N/A;   Social History   Occupational History  . Not on file  Tobacco Use  . Smoking status: Never Smoker  . Smokeless tobacco: Never Used  Vaping Use  . Vaping Use: Never used  Substance and Sexual Activity  . Alcohol use: No  . Drug use: No  . Sexual activity: Yes

## 2020-01-01 ENCOUNTER — Encounter: Payer: Self-pay | Admitting: Orthopedic Surgery

## 2020-01-01 ENCOUNTER — Ambulatory Visit: Payer: Medicare Other | Admitting: Orthopedic Surgery

## 2020-01-01 VITALS — Ht 64.0 in | Wt 236.0 lb

## 2020-01-01 DIAGNOSIS — M86172 Other acute osteomyelitis, left ankle and foot: Secondary | ICD-10-CM | POA: Diagnosis not present

## 2020-01-01 NOTE — Progress Notes (Signed)
Office Visit Note   Patient: Debra [name]           Date of Birth: 07/31/1945           MRN: [id number] Visit Date: 01/01/2020              Requested by: Vicenta Aly, [address] [place],  [place] [address] PCP: Vicenta Aly, FNP  Chief Complaint  Patient presents with  . Left Foot - Follow-up    Left partial calcaneous excision 07/26/2019      HPI: Patient is a 74 year old woman who presents with an ulcer left heel status post partial calcaneal excision 5 months ago.  She is on Keflex 4 times a day.  Patient states she has a small amount of drainage.  She states her sugars have been running well and was 108 this morning.  Patient denies any systemic symptoms.  Assessment & Plan: Visit Diagnoses:  1. Acute osteomyelitis of left calcaneus ([place])     Plan: We will continue the antibiotics and dressing changes.  Follow-Up Instructions: Return in about 2 weeks (around 01/15/2020).   Ortho Exam  Patient is alert, oriented, no adenopathy, well-dressed, normal affect, normal respiratory effort. Examination patient states that she fell this past Sunday and her brother had to get her off the floor.  She states that she still has some drainage examination the ulcer measures 3 mm in diameter and probes 2 cm deep to bone.  There is no cellulitis there is no tenderness to palpation the drainage is clear serosanguineous.  Imaging: No results found. No images are attached to the encounter.  Labs: Lab Results  Component Value Date   HGBA1C 8.6 (H) 07/22/2019   HGBA1C 8.9 (H) 07/27/2017   HGBA1C 7.6 (H) 05/22/2016   ESRSEDRATE 36 (H) 11/20/2019   ESRSEDRATE 60 (H) 07/28/2019   ESRSEDRATE 22 07/10/2019   CRP 10.9 (H) 11/20/2019   CRP 10.1 (H) 07/28/2019   CRP 3.8 07/10/2019   LABURIC 5.5 11/29/2014   LABURIC 4.6 10/01/2014   LABURIC 5.9 06/09/2013   REPTSTATUS 07/30/2019 FINAL 07/26/2019   GRAMSTAIN  07/26/2019    ABUNDANT WBC PRESENT,BOTH PMN AND  MONONUCLEAR ABUNDANT GRAM POSITIVE COCCI FEW GRAM NEGATIVE RODS    CULT  07/26/2019    ABUNDANT GROUP B STREP(S.AGALACTIAE)ISOLATED TESTING AGAINST S. AGALACTIAE NOT ROUTINELY PERFORMED DUE TO PREDICTABILITY OF AMP/PEN/VAN SUSCEPTIBILITY. FEW STAPHYLOCOCCUS HAEMOLYTICUS ABUNDANT BACTEROIDES FRAGILIS BETA LACTAMASE POSITIVE Performed at [facility], [place] [address] [place], [place] [address]    LABORGA STAPHYLOCOCCUS HAEMOLYTICUS 07/26/2019     Lab Results  Component Value Date   ALBUMIN 3.2 (L) 07/22/2019   ALBUMIN 2.4 (L) 08/02/2017   ALBUMIN 3.1 (L) 07/28/2017   LABURIC 5.5 11/29/2014   LABURIC 4.6 10/01/2014   LABURIC 5.9 06/09/2013    Lab Results  Component Value Date   MG 1.2 (L) 08/02/2017   MG 1.6 (L) 07/29/2017   MG 1.3 (L) 07/27/2017   Lab Results  Component Value Date   VD25OH 28.7 (L) 11/28/2012    No results found for: PREALBUMIN CBC EXTENDED Latest Ref Rng & Units 11/20/2019 07/31/2019 07/30/2019  WBC 3.8 - 10.8 Thousand/uL 5.5 6.9 8.0  RBC 3.80 - 5.10 Million/uL 3.53(L) 3.59(L) 3.58(L)  HGB 11.7 - 15.5 g/dL 10.7(L) 10.6(L) 10.3(L)  HCT 35 - 45 % 32.6(L) 33.1(L) 33.0(L)  PLT 140 - 400 Thousand/uL 282 298 296  NEUTROABS 1,500 - 7,800 cells/uL 2,602 - -  LYMPHSABS 850 -  3,900 cells/uL 1,771 - -     Body mass index is 40.51 kg/m.  Orders:  No orders of the defined types were placed in this encounter.  No orders of the defined types were placed in this encounter.    Procedures: No procedures performed  Clinical Data: No additional findings.  ROS:  All other systems negative, except as noted in the HPI. Review of Systems  Objective: Vital Signs: Ht 5\' 4"  (1.626 m)   Wt 236 lb (107 kg)   LMP 07/02/1973   BMI 40.51 kg/m   Specialty Comments:  No specialty comments available.  PMFS History: Patient Active Problem List   Diagnosis Date Noted  . Cutaneous abscess of left foot   . Osteomyelitis ([place]) 07/22/2019  . Diabetic foot  infection ([place]) 07/10/2019  . Acute osteomyelitis of left calcaneus ([place]) 07/10/2019  . Group B streptococcal infection 07/10/2019  . 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 Pseudogout of right knee 08/02/2017  . SBO (small bowel obstruction) ([place]) 07/22/2017  . Abnormal LFTs 03/20/2016  . Abdominal pain, left lower quadrant 07/04/2015  . Type 2 diabetes mellitus with diabetic polyneuropathy ([place]) 10/06/2013  . Hyperlipidemia associated with type 2 diabetes mellitus ([place]) 10/06/2013  . Fungal dermatitis 08/11/2013  . Hyperlipidemia 06/09/2013  . Choledocholithiasis with acute cholecystitis 04/14/2013  . Symptomatic cholelithiasis 04/13/2013  . Neuropathic pain of both legs 04/04/2013  . Severe obesity (BMI >= 40) ([place]) 11/28/2012  . Obesity (BMI 30-39.9) 11/28/2012  . Diabetes mellitus with neurological manifestation ([place])   . Hypertension goal BP (blood pressure) < 130/80   . Osteopenia   . Chronic venous insufficiency   . Leg edema   . Hypertensive retinopathy, grade 2 03/30/2012  . Medial epicondylitis 02/11/2012  . Superficial thrombophlebitis of left leg 07/15/2011  . GERD (gastroesophageal reflux disease) 02/25/2011  . Right knee pain 02/16/2011  . Preventive measure 09/11/2010  . Neck pain 07/03/2010  . Grieving 04/17/2010  . OSTEOPENIA 04/12/2006  . Controlled type 2 diabetes mellitus with complication, with long-term current use of insulin ([place]) 03/08/2006  . MORBID OBESITY 03/08/2006  . Essential hypertension 03/08/2006   Past Medical History:  Diagnosis Date  . Acute osteomyelitis of calcaneum, left ([place]) 07/10/2019  . Cellulitis of left leg    history of, most recent episode 01/09  . Chronic venous insufficiency   . Diabetes mellitus with neurological manifestation ([place])   . Diabetic foot infection ([place]) 07/10/2019  . Diverticulitis    h/o 2-3 episodes in past  . Group B streptococcal infection 07/10/2019  . Hyperlipidemia LDL goal < 100     . Hypertension goal BP (blood pressure) < 130/80   . Leg edema    secondary to chronic venous insufficiency  . Morbid obesity ([place])   . Osteopenia    DEXA scan 7/07  . Pseudomonas aeruginosa infection 07/10/2019    Family History  Problem Relation Age of Onset  . Heart disease Mother   . Heart disease Father   . Heart disease Brother   . Cancer Brother   . Cancer Brother   . Hypertension Sister   . Heart disease Sister     Past Surgical History:  Procedure Laterality Date  . ABDOMINAL HYSTERECTOMY    . APPENDECTOMY    . CHOLECYSTECTOMY    . CHOLECYSTECTOMY N/A 04/14/2013   Procedure: LAPAROSCOPIC CHOLECYSTECTOMY WITH INTRAOPERATIVE CHOLANGIOGRAM;  Surgeon: Adin Hector, MD;  [facility];  Service: General;  Laterality: N/A;  .  COLECTOMY     with diverting colsotmy and revision in the 1990's for diverticulitis  . ERCP N/A 04/17/2013   Procedure: ENDOSCOPIC RETROGRADE CHOLANGIOPANCREATOGRAPHY (ERCP);  Surgeon: Irene Shipper, MD;  Location: [facility];  Service: Endoscopy;  Laterality: N/A;  note pt want general anesthesia for this case.  preferto perform in endo unit, but if unavailable please arrange time in OR  . EYE SURGERY  8/13   left cataract removal & retinal repair  . I & D EXTREMITY Left 07/26/2019   Procedure: LEFT PARTIAL CALCANEOUS EXCISION;  Surgeon: [name], MD;  Location: [facility];  Service: Orthopedics;  Laterality: Left;  . INSERTION OF MESH N/A 07/30/2017   Procedure: INSERTION OF MESH;  Surgeon: Jovita Kussmaul, MD;  [facility];  Service: General;  Laterality: N/A;  . LAPAROSCOPIC LYSIS OF ADHESIONS N/A 04/14/2013   Procedure: LAPAROSCOPIC LYSIS OF ADHESIONS;  Surgeon: Adin Hector, MD;  [facility];  Service: General;  Laterality: N/A;  . LAPAROTOMY N/A 07/30/2017   Procedure: EXPLORATORY LAPAROTOMY;  Surgeon: Jovita Kussmaul, MD;  [facility];  Service: General;  Laterality: N/A;  . LYSIS OF ADHESION N/A 07/30/2017   Procedure:  EXTENSIVE LYSIS OF ADHESION;  Surgeon: Jovita Kussmaul, MD;  [facility];  Service: General;  Laterality: N/A;  . OMENTECTOMY N/A 07/30/2017   Procedure: PARTIAL OMENTECTOMY;  Surgeon: Jovita Kussmaul, MD;  [facility];  Service: General;  Laterality: N/A;  . VENTRAL HERNIA REPAIR N/A 07/30/2017   Procedure: HERNIA REPAIR VENTRAL ADULT;  Surgeon: Jovita Kussmaul, MD;  [facility];  Service: General;  Laterality: N/A;   Social History   Occupational History  . Not on file  Tobacco Use  . Smoking status: Never Smoker  . Smokeless tobacco: Never Used  Vaping Use  . Vaping Use: Never used  Substance and Sexual Activity  . Alcohol use: No  . Drug use: No  . Sexual activity: Yes

## 2020-01-15 ENCOUNTER — Ambulatory Visit: Payer: Medicare Other | Admitting: Orthopedic Surgery

## 2020-01-29 ENCOUNTER — Encounter: Payer: Self-pay | Admitting: Orthopedic Surgery

## 2020-01-29 ENCOUNTER — Ambulatory Visit: Payer: Medicare Other | Admitting: Physician Assistant

## 2020-01-29 VITALS — Ht 64.0 in | Wt 236.0 lb

## 2020-01-29 DIAGNOSIS — M8600 Acute hematogenous osteomyelitis, unspecified site: Secondary | ICD-10-CM

## 2020-01-29 NOTE — Progress Notes (Signed)
Office Visit Note   Patient: Debra [name]           Date of Birth: [birth year]           MRN: [id number] Visit Date: 01/29/2020              Requested by: Vicenta Aly, [place],  [place] [id number] PCP: Vicenta Aly, FNP  Chief Complaint  Patient presents with  . Left Foot - Follow-up    Left partial calcaneous excision 07/26/2019      HPI: Patient presents today she is 6 months status post left partial calcaneus incision.  She normally wears a compression sock but is just wearing a dressing today so we could examine her.  She feels like the serous drainage is slowing down.  She also has some small areas of on her anterior ankle of some erythema that are red and swelling.  Assessment & Plan: Visit Diagnoses: No diagnosis found.  Plan: Continue with compression stocking.  Can follow-up in 1 month.  Follow-Up Instructions: No follow-ups on file.   Ortho Exam  Patient is alert, oriented, no adenopathy, well-dressed, normal affect, normal respiratory effort. Focused examination does demonstrate some venous stasis disease of her lower leg but no ulcers she has a 2 areas of abrasion.  No cellulitis its ascending.  On the bottom of her heel I can only appreciate a scant amount of drainage and that is with me trying to express drainage it is serous in nature there is no foul odor no cellulitis she does not have any pain and states she does not really feel much in her foot at all  Imaging: No results found. No images are attached to the encounter.  Labs: Lab Results  Component Value Date   HGBA1C 8.6 (H) 07/22/2019   HGBA1C 8.9 (H) 07/27/2017   HGBA1C 7.6 (H) 05/22/2016   ESRSEDRATE 36 (H) 11/20/2019   ESRSEDRATE 60 (H) 07/28/2019   ESRSEDRATE 22 07/10/2019   CRP 10.9 (H) 11/20/2019   CRP 10.1 (H) 07/28/2019   CRP 3.8 07/10/2019   LABURIC 5.5 11/29/2014   LABURIC 4.6 10/01/2014   LABURIC 5.9 06/09/2013   REPTSTATUS 07/30/2019 FINAL  07/26/2019   GRAMSTAIN  07/26/2019    ABUNDANT WBC PRESENT,BOTH PMN AND MONONUCLEAR ABUNDANT GRAM POSITIVE COCCI FEW GRAM NEGATIVE RODS    CULT  07/26/2019    ABUNDANT GROUP B STREP(S.AGALACTIAE)ISOLATED TESTING AGAINST S. AGALACTIAE NOT ROUTINELY PERFORMED DUE TO PREDICTABILITY OF AMP/PEN/VAN SUSCEPTIBILITY. FEW STAPHYLOCOCCUS HAEMOLYTICUS ABUNDANT BACTEROIDES FRAGILIS BETA LACTAMASE POSITIVE Performed at [facility], [place] [address], [place], [place] [address]    LABORGA STAPHYLOCOCCUS HAEMOLYTICUS 07/26/2019     Lab Results  Component Value Date   ALBUMIN 3.2 (L) 07/22/2019   ALBUMIN 2.4 (L) 08/02/2017   ALBUMIN 3.1 (L) 07/28/2017   LABURIC 5.5 11/29/2014   LABURIC 4.6 10/01/2014   LABURIC 5.9 06/09/2013    Lab Results  Component Value Date   MG 1.2 (L) 08/02/2017   MG 1.6 (L) 07/29/2017   MG 1.3 (L) 07/27/2017   Lab Results  Component Value Date   VD25OH 28.7 (L) 11/28/2012    No results found for: PREALBUMIN CBC EXTENDED Latest Ref Rng & Units 11/20/2019 07/31/2019 07/30/2019  WBC 3.8 - 10.8 Thousand/uL 5.5 6.9 8.0  RBC 3.80 - 5.10 Million/uL 3.53(L) 3.59(L) 3.58(L)  HGB 11.7 - 15.5 g/dL 10.7(L) 10.6(L) 10.3(L)  HCT 35 - 45 % 32.6(L) 33.1(L) 33.0(L)  PLT 140 -  400 Thousand/uL 282 298 296  NEUTROABS 1,500 - 7,800 cells/uL 2,602 - -  LYMPHSABS 850 - 3,900 cells/uL 1,771 - -     Body mass index is 40.51 kg/m.  Orders:  No orders of the defined types were placed in this encounter.  No orders of the defined types were placed in this encounter.    Procedures: No procedures performed  Clinical Data: No additional findings.  ROS:  All other systems negative, except as noted in the HPI. Review of Systems  Objective: Vital Signs: Ht 5\' 4"  (1.626 m)   Wt 236 lb (107 kg)   LMP 07/02/1973   BMI 40.51 kg/m   Specialty Comments:  No specialty comments available.  PMFS History: Patient Active Problem List   Diagnosis Date Noted  . Cutaneous  abscess of left foot   . Osteomyelitis ([place]) 07/22/2019  . Diabetic foot infection ([place]) 07/10/2019  . Acute osteomyelitis of left calcaneus ([place]) 07/10/2019  . Group B streptococcal infection 07/10/2019  . 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 Pseudogout of right knee 08/02/2017  . SBO (small bowel obstruction) ([place]) 07/22/2017  . Abnormal LFTs 03/20/2016  . Abdominal pain, left lower quadrant 07/04/2015  . Type 2 diabetes mellitus with diabetic polyneuropathy ([place]) 10/06/2013  . Hyperlipidemia associated with type 2 diabetes mellitus ([place]) 10/06/2013  . Fungal dermatitis 08/11/2013  . Hyperlipidemia 06/09/2013  . Choledocholithiasis with acute cholecystitis 04/14/2013  . Symptomatic cholelithiasis 04/13/2013  . Neuropathic pain of both legs 04/04/2013  . Severe obesity (BMI >= 40) ([place]) 11/28/2012  . Obesity (BMI 30-39.9) 11/28/2012  . Diabetes mellitus with neurological manifestation ([place])   . Hypertension goal BP (blood pressure) < 130/80   . Osteopenia   . Chronic venous insufficiency   . Leg edema   . Hypertensive retinopathy, grade 2 03/30/2012  . Medial epicondylitis 02/11/2012  . Superficial thrombophlebitis of left leg 07/15/2011  . GERD (gastroesophageal reflux disease) 02/25/2011  . Right knee pain 02/16/2011  . Preventive measure 09/11/2010  . Neck pain 07/03/2010  . Grieving 04/17/2010  . OSTEOPENIA 04/12/2006  . Controlled type 2 diabetes mellitus with complication, with long-term current use of insulin ([place]) 03/08/2006  . MORBID OBESITY 03/08/2006  . Essential hypertension 03/08/2006   Past Medical History:  Diagnosis Date  . Acute osteomyelitis of calcaneum, left ([place]) 07/10/2019  . Cellulitis of left leg    history of, most recent episode 01/09  . Chronic venous insufficiency   . Diabetes mellitus with neurological manifestation ([place])   . Diabetic foot infection ([place]) 07/10/2019  . Diverticulitis    h/o 2-3 episodes in past  .  Group B streptococcal infection 07/10/2019  . Hyperlipidemia LDL goal < 100   . Hypertension goal BP (blood pressure) < 130/80   . Leg edema    secondary to chronic venous insufficiency  . Morbid obesity ([place])   . Osteopenia    DEXA scan 7/07  . Pseudomonas aeruginosa infection 07/10/2019    Family History  Problem Relation Age of Onset  . Heart disease Mother   . Heart disease Father   . Heart disease Brother   . Cancer Brother   . Cancer Brother   . Hypertension Sister   . Heart disease Sister     Past Surgical History:  Procedure Laterality Date  . ABDOMINAL HYSTERECTOMY    . APPENDECTOMY    . CHOLECYSTECTOMY    . CHOLECYSTECTOMY N/A 04/14/2013   Procedure: LAPAROSCOPIC CHOLECYSTECTOMY WITH INTRAOPERATIVE CHOLANGIOGRAM;  Surgeon: Adin Hector, MD;  Location: [facility];  Service: General;  Laterality: N/A;  . COLECTOMY     with diverting colsotmy and revision in the 1990's for diverticulitis  . ERCP N/A 04/17/2013   Procedure: ENDOSCOPIC RETROGRADE CHOLANGIOPANCREATOGRAPHY (ERCP);  Surgeon: Irene Shipper, MD;  Location: [facility];  Service: Endoscopy;  Laterality: N/A;  note pt want general anesthesia for this case.  preferto perform in endo unit, but if unavailable please arrange time in OR  . EYE SURGERY  8/13   left cataract removal & retinal repair  . I & D EXTREMITY Left 07/26/2019   Procedure: LEFT PARTIAL CALCANEOUS EXCISION;  Surgeon: [name], MD;  Location: [facility];  Service: Orthopedics;  Laterality: Left;  . INSERTION OF MESH N/A 07/30/2017   Procedure: INSERTION OF MESH;  Surgeon: Jovita Kussmaul, MD;  [facility];  Service: General;  Laterality: N/A;  . LAPAROSCOPIC LYSIS OF ADHESIONS N/A 04/14/2013   Procedure: LAPAROSCOPIC LYSIS OF ADHESIONS;  Surgeon: Adin Hector, MD;  [facility];  Service: General;  Laterality: N/A;  . LAPAROTOMY N/A 07/30/2017   Procedure: EXPLORATORY LAPAROTOMY;  Surgeon: Jovita Kussmaul, MD;  [facility];  Service:  General;  Laterality: N/A;  . LYSIS OF ADHESION N/A 07/30/2017   Procedure: EXTENSIVE LYSIS OF ADHESION;  Surgeon: Jovita Kussmaul, MD;  [facility];  Service: General;  Laterality: N/A;  . OMENTECTOMY N/A 07/30/2017   Procedure: PARTIAL OMENTECTOMY;  Surgeon: Jovita Kussmaul, MD;  [facility];  Service: General;  Laterality: N/A;  . VENTRAL HERNIA REPAIR N/A 07/30/2017   Procedure: HERNIA REPAIR VENTRAL ADULT;  Surgeon: Jovita Kussmaul, MD;  [facility];  Service: General;  Laterality: N/A;   Social History   Occupational History  . Not on file  Tobacco Use  . Smoking status: Never Smoker  . Smokeless tobacco: Never Used  Vaping Use  . Vaping Use: Never used  Substance and Sexual Activity  . Alcohol use: No  . Drug use: No  . Sexual activity: Yes

## 2020-02-29 ENCOUNTER — Ambulatory Visit: Payer: Medicare Other | Admitting: Physician Assistant

## 2020-02-29 ENCOUNTER — Encounter: Payer: Self-pay | Admitting: Orthopedic Surgery

## 2020-02-29 VITALS — Ht 64.0 in | Wt 236.0 lb

## 2020-02-29 DIAGNOSIS — L97421 Non-pressure chronic ulcer of left heel and midfoot limited to breakdown of skin: Secondary | ICD-10-CM

## 2020-02-29 NOTE — Progress Notes (Signed)
Office Visit Note   Patient: Debra [name]           Date of Birth: Jul 25, 1945           MRN: [id number] Visit Date: 02/29/2020              Requested by: Vicenta Aly, [place],  [place] [address] PCP: Vicenta Aly, FNP  Chief Complaint  Patient presents with  . Left Foot - Follow-up    07/26/19 left partial calcaneal excision  left heel ulcer follow up       HPI: Patient presents today 7 months status post left partial calcaneus excision.  She is on chronic antibiotics.  Sometimes this causes decrease in appetite but otherwise she is doing well.  She has had no drainage from her heel and has had no pain  Assessment & Plan: Visit Diagnoses: No diagnosis found.  Plan: Discussed the importance of a probiotic especially with chronic antibiotic use.  May follow-up in 2 months but sooner if she has any return of drainage or pain or redness  Follow-Up Instructions: No follow-ups on file.   Ortho Exam  Patient is alert, oriented, no adenopathy, well-dressed, normal affect, normal respiratory effort. Well-healed incision nontender to deep palpation small scabbed area with no surrounding cellulitis not express any fluid.  No sign of infection currently.  No foul odor  Imaging: No results found. No images are attached to the encounter.  Labs: Lab Results  Component Value Date   HGBA1C 8.6 (H) 07/22/2019   HGBA1C 8.9 (H) 07/27/2017   HGBA1C 7.6 (H) 05/22/2016   ESRSEDRATE 36 (H) 11/20/2019   ESRSEDRATE 60 (H) 07/28/2019   ESRSEDRATE 22 07/10/2019   CRP 10.9 (H) 11/20/2019   CRP 10.1 (H) 07/28/2019   CRP 3.8 07/10/2019   LABURIC 5.5 11/29/2014   LABURIC 4.6 10/01/2014   LABURIC 5.9 06/09/2013   REPTSTATUS 07/30/2019 FINAL 07/26/2019   GRAMSTAIN  07/26/2019    ABUNDANT WBC PRESENT,BOTH PMN AND MONONUCLEAR ABUNDANT GRAM POSITIVE COCCI FEW GRAM NEGATIVE RODS    CULT  07/26/2019    ABUNDANT GROUP B STREP(S.AGALACTIAE)ISOLATED TESTING  AGAINST S. AGALACTIAE NOT ROUTINELY PERFORMED DUE TO PREDICTABILITY OF AMP/PEN/VAN SUSCEPTIBILITY. FEW STAPHYLOCOCCUS HAEMOLYTICUS ABUNDANT BACTEROIDES FRAGILIS BETA LACTAMASE POSITIVE Performed at [facility], [place] [address] [place], [place] [address]    LABORGA STAPHYLOCOCCUS HAEMOLYTICUS 07/26/2019     Lab Results  Component Value Date   ALBUMIN 3.2 (L) 07/22/2019   ALBUMIN 2.4 (L) 08/02/2017   ALBUMIN 3.1 (L) 07/28/2017   LABURIC 5.5 11/29/2014   LABURIC 4.6 10/01/2014   LABURIC 5.9 06/09/2013    Lab Results  Component Value Date   MG 1.2 (L) 08/02/2017   MG 1.6 (L) 07/29/2017   MG 1.3 (L) 07/27/2017   Lab Results  Component Value Date   VD25OH 28.7 (L) 11/28/2012    No results found for: PREALBUMIN CBC EXTENDED Latest Ref Rng & Units 11/20/2019 07/31/2019 07/30/2019  WBC 3.8 - 10.8 Thousand/uL 5.5 6.9 8.0  RBC 3.80 - 5.10 Million/uL 3.53(L) 3.59(L) 3.58(L)  HGB 11.7 - 15.5 g/dL 10.7(L) 10.6(L) 10.3(L)  HCT 35.0 - 45.0 % 32.6(L) 33.1(L) 33.0(L)  PLT 140 - 400 Thousand/uL 282 298 296  NEUTROABS 1,500 - 7,800 cells/uL 2,602 - -  LYMPHSABS 850 - 3,900 cells/uL 1,771 - -     Body mass index is 40.51 kg/m.  Orders:  No orders of the defined types were placed in this  encounter.  No orders of the defined types were placed in this encounter.    Procedures: No procedures performed  Clinical Data: No additional findings.  ROS:  All other systems negative, except as noted in the HPI. Review of Systems  Objective: Vital Signs: Ht 5\' 4"  (1.626 m)   Wt 236 lb (107 kg)   LMP 07/02/1973   BMI 40.51 kg/m   Specialty Comments:  No specialty comments available.  PMFS History: Patient Active Problem List   Diagnosis Date Noted  . Cutaneous abscess of left foot   . Osteomyelitis ([place]) 07/22/2019  . Diabetic foot infection ([place]) 07/10/2019  . Acute osteomyelitis of left calcaneus ([place]) 07/10/2019  . Group B streptococcal infection 07/10/2019  .  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 Pseudogout of right knee 08/02/2017  . SBO (small bowel obstruction) ([place]) 07/22/2017  . Abnormal LFTs 03/20/2016  . Abdominal pain, left lower quadrant 07/04/2015  . Type 2 diabetes mellitus with diabetic polyneuropathy ([place]) 10/06/2013  . Hyperlipidemia associated with type 2 diabetes mellitus ([place]) 10/06/2013  . Fungal dermatitis 08/11/2013  . Hyperlipidemia 06/09/2013  . Choledocholithiasis with acute cholecystitis 04/14/2013  . Symptomatic cholelithiasis 04/13/2013  . Neuropathic pain of both legs 04/04/2013  . Severe obesity (BMI >= 40) ([place]) 11/28/2012  . Obesity (BMI 30-39.9) 11/28/2012  . Diabetes mellitus with neurological manifestation ([place])   . Hypertension goal BP (blood pressure) < 130/80   . Osteopenia   . Chronic venous insufficiency   . Leg edema   . Hypertensive retinopathy, grade 2 03/30/2012  . Medial epicondylitis 02/11/2012  . Superficial thrombophlebitis of left leg 07/15/2011  . GERD (gastroesophageal reflux disease) 02/25/2011  . Right knee pain 02/16/2011  . Preventive measure 09/11/2010  . Neck pain 07/03/2010  . Grieving 04/17/2010  . OSTEOPENIA 04/12/2006  . Controlled type 2 diabetes mellitus with complication, with long-term current use of insulin ([place]) 03/08/2006  . MORBID OBESITY 03/08/2006  . Essential hypertension 03/08/2006   Past Medical History:  Diagnosis Date  . Acute osteomyelitis of calcaneum, left ([place]) 07/10/2019  . Cellulitis of left leg    history of, most recent episode 01/09  . Chronic venous insufficiency   . Diabetes mellitus with neurological manifestation ([place])   . Diabetic foot infection ([place]) 07/10/2019  . Diverticulitis    h/o 2-3 episodes in past  . Group B streptococcal infection 07/10/2019  . Hyperlipidemia LDL goal < 100   . Hypertension goal BP (blood pressure) < 130/80   . Leg edema    secondary to chronic venous insufficiency  . Morbid obesity ([place])    . Osteopenia    DEXA scan 7/07  . Pseudomonas aeruginosa infection 07/10/2019    Family History  Problem Relation Age of Onset  . Heart disease Mother   . Heart disease Father   . Heart disease Brother   . Cancer Brother   . Cancer Brother   . Hypertension Sister   . Heart disease Sister     Past Surgical History:  Procedure Laterality Date  . ABDOMINAL HYSTERECTOMY    . APPENDECTOMY    . CHOLECYSTECTOMY    . CHOLECYSTECTOMY N/A 04/14/2013   Procedure: LAPAROSCOPIC CHOLECYSTECTOMY WITH INTRAOPERATIVE CHOLANGIOGRAM;  Surgeon: Adin Hector, MD;  [facility];  Service: General;  Laterality: N/A;  . COLECTOMY     with diverting colsotmy and revision in the 1990's for diverticulitis  . ERCP N/A 04/17/2013   Procedure: ENDOSCOPIC RETROGRADE CHOLANGIOPANCREATOGRAPHY (ERCP);  Surgeon:  Irene Shipper, MD;  Location: [facility];  Service: Endoscopy;  Laterality: N/A;  note pt want general anesthesia for this case.  preferto perform in endo unit, but if unavailable please arrange time in OR  . EYE SURGERY  8/13   left cataract removal & retinal repair  . I & D EXTREMITY Left 07/26/2019   Procedure: LEFT PARTIAL CALCANEOUS EXCISION;  Surgeon: [name], MD;  Location: [facility];  Service: Orthopedics;  Laterality: Left;  . INSERTION OF MESH N/A 07/30/2017   Procedure: INSERTION OF MESH;  Surgeon: Jovita Kussmaul, MD;  [facility];  Service: General;  Laterality: N/A;  . LAPAROSCOPIC LYSIS OF ADHESIONS N/A 04/14/2013   Procedure: LAPAROSCOPIC LYSIS OF ADHESIONS;  Surgeon: Adin Hector, MD;  [facility];  Service: General;  Laterality: N/A;  . LAPAROTOMY N/A 07/30/2017   Procedure: EXPLORATORY LAPAROTOMY;  Surgeon: Jovita Kussmaul, MD;  [facility];  Service: General;  Laterality: N/A;  . LYSIS OF ADHESION N/A 07/30/2017   Procedure: EXTENSIVE LYSIS OF ADHESION;  Surgeon: Jovita Kussmaul, MD;  [facility];  Service: General;  Laterality: N/A;  . OMENTECTOMY N/A  07/30/2017   Procedure: PARTIAL OMENTECTOMY;  Surgeon: Jovita Kussmaul, MD;  [facility];  Service: General;  Laterality: N/A;  . VENTRAL HERNIA REPAIR N/A 07/30/2017   Procedure: HERNIA REPAIR VENTRAL ADULT;  Surgeon: Jovita Kussmaul, MD;  [facility];  Service: General;  Laterality: N/A;   Social History   Occupational History  . Not on file  Tobacco Use  . Smoking status: Never Smoker  . Smokeless tobacco: Never Used  Vaping Use  . Vaping Use: Never used  Substance and Sexual Activity  . Alcohol use: No  . Drug use: No  . Sexual activity: Yes

## 2020-03-05 ENCOUNTER — Telehealth: Payer: Self-pay | Admitting: Orthopedic Surgery

## 2020-03-05 NOTE — Telephone Encounter (Signed)
Sw pt and she is concerned that she is having skin break down on her heeel. Appt offered for today and pt declined she will come in tomorrow at 10am

## 2020-03-05 NOTE — Telephone Encounter (Signed)
Pt called stating she was instructed to call if she had any concerns and she states she's worried about the heel of her foot; she would like a CB to discuss further   [phone]

## 2020-03-05 NOTE — Telephone Encounter (Signed)
Tried to call pt x 2 phone rings without answer. Will hold message and try again later.

## 2020-03-06 ENCOUNTER — Encounter: Payer: Self-pay | Admitting: Physician Assistant

## 2020-03-06 ENCOUNTER — Ambulatory Visit (INDEPENDENT_AMBULATORY_CARE_PROVIDER_SITE_OTHER): Payer: Medicare Other | Admitting: Physician Assistant

## 2020-03-06 DIAGNOSIS — L97421 Non-pressure chronic ulcer of left heel and midfoot limited to breakdown of skin: Secondary | ICD-10-CM

## 2020-03-06 NOTE — Progress Notes (Signed)
Office Visit Note   Patient: Debra [name]           Date of Birth: 12-01-1945           MRN: [id number] Visit Date: 03/06/2020              Requested by: Vicenta Aly, [place],  [place] [address] PCP: Vicenta Aly, FNP  Chief Complaint  Patient presents with  . Left Foot - Follow-up      HPI: Patient presents in follow-up I saw her earlier this week she was concerned because she had a small callused area on the bottom of her heel she doesn't have any pain or drainage Assessment & Plan: Visit Diagnoses: No diagnosis found.  Plan: We'll follow-up in a month should use continuing to use lotion.  I do not see any sign of acute infection and this callus was present at previous visit  Follow-Up Instructions: No follow-ups on file.   Ortho Exam  Patient is alert, oriented, no adenopathy, well-dressed, normal affect, normal respiratory effort. Examination of her calcaneus she doesn't have any erythema no drainage very small thin callused area about half a centimeter in diameter without surrounding cellulitis does not express any fluid.  Imaging: No results found. No images are attached to the encounter.  Labs: Lab Results  Component Value Date   HGBA1C 8.6 (H) 07/22/2019   HGBA1C 8.9 (H) 07/27/2017   HGBA1C 7.6 (H) 05/22/2016   ESRSEDRATE 36 (H) 11/20/2019   ESRSEDRATE 60 (H) 07/28/2019   ESRSEDRATE 22 07/10/2019   CRP 10.9 (H) 11/20/2019   CRP 10.1 (H) 07/28/2019   CRP 3.8 07/10/2019   LABURIC 5.5 11/29/2014   LABURIC 4.6 10/01/2014   LABURIC 5.9 06/09/2013   REPTSTATUS 07/30/2019 FINAL 07/26/2019   GRAMSTAIN  07/26/2019    ABUNDANT WBC PRESENT,BOTH PMN AND MONONUCLEAR ABUNDANT GRAM POSITIVE COCCI FEW GRAM NEGATIVE RODS    CULT  07/26/2019    ABUNDANT GROUP B STREP(S.AGALACTIAE)ISOLATED TESTING AGAINST S. AGALACTIAE NOT ROUTINELY PERFORMED DUE TO PREDICTABILITY OF AMP/PEN/VAN SUSCEPTIBILITY. FEW STAPHYLOCOCCUS  HAEMOLYTICUS ABUNDANT BACTEROIDES FRAGILIS BETA LACTAMASE POSITIVE Performed at [facility], [place] [address], [place], [place] [address]    LABORGA STAPHYLOCOCCUS HAEMOLYTICUS 07/26/2019     Lab Results  Component Value Date   ALBUMIN 3.2 (L) 07/22/2019   ALBUMIN 2.4 (L) 08/02/2017   ALBUMIN 3.1 (L) 07/28/2017   LABURIC 5.5 11/29/2014   LABURIC 4.6 10/01/2014   LABURIC 5.9 06/09/2013    Lab Results  Component Value Date   MG 1.2 (L) 08/02/2017   MG 1.6 (L) 07/29/2017   MG 1.3 (L) 07/27/2017   Lab Results  Component Value Date   VD25OH 28.7 (L) 11/28/2012    No results found for: PREALBUMIN CBC EXTENDED Latest Ref Rng & Units 11/20/2019 07/31/2019 07/30/2019  WBC 3.8 - 10.8 Thousand/uL 5.5 6.9 8.0  RBC 3.80 - 5.10 Million/uL 3.53(L) 3.59(L) 3.58(L)  HGB 11.7 - 15.5 g/dL 10.7(L) 10.6(L) 10.3(L)  HCT 35.0 - 45.0 % 32.6(L) 33.1(L) 33.0(L)  PLT 140 - 400 Thousand/uL 282 298 296  NEUTROABS 1,500 - 7,800 cells/uL 2,602 - -  LYMPHSABS 850 - 3,900 cells/uL 1,771 - -     There is no height or weight on file to calculate BMI.  Orders:  No orders of the defined types were placed in this encounter.  No orders of the defined types were placed in this encounter.    Procedures: No procedures performed  Clinical Data: No additional findings.  ROS:  All other systems negative, except as noted in the HPI. Review of Systems  Objective: Vital Signs: LMP 07/02/1973   Specialty Comments:  No specialty comments available.  PMFS History: Patient Active Problem List   Diagnosis Date Noted  . Cutaneous abscess of left foot   . Osteomyelitis ([place]) 07/22/2019  . Diabetic foot infection ([place]) 07/10/2019  . Acute osteomyelitis of left calcaneus ([place]) 07/10/2019  . Group B streptococcal infection 07/10/2019  . 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 Pseudogout of right knee 08/02/2017  . SBO (small bowel obstruction) ([place]) 07/22/2017  . Abnormal  LFTs 03/20/2016  . Abdominal pain, left lower quadrant 07/04/2015  . Type 2 diabetes mellitus with diabetic polyneuropathy ([place]) 10/06/2013  . Hyperlipidemia associated with type 2 diabetes mellitus ([place]) 10/06/2013  . Fungal dermatitis 08/11/2013  . Hyperlipidemia 06/09/2013  . Choledocholithiasis with acute cholecystitis 04/14/2013  . Symptomatic cholelithiasis 04/13/2013  . Neuropathic pain of both legs 04/04/2013  . Severe obesity (BMI >= 40) ([place]) 11/28/2012  . Obesity (BMI 30-39.9) 11/28/2012  . Diabetes mellitus with neurological manifestation ([place])   . Hypertension goal BP (blood pressure) < 130/80   . Osteopenia   . Chronic venous insufficiency   . Leg edema   . Hypertensive retinopathy, grade 2 03/30/2012  . Medial epicondylitis 02/11/2012  . Superficial thrombophlebitis of left leg 07/15/2011  . GERD (gastroesophageal reflux disease) 02/25/2011  . Right knee pain 02/16/2011  . Preventive measure 09/11/2010  . Neck pain 07/03/2010  . Grieving 04/17/2010  . OSTEOPENIA 04/12/2006  . Controlled type 2 diabetes mellitus with complication, with long-term current use of insulin ([place]) 03/08/2006  . MORBID OBESITY 03/08/2006  . Essential hypertension 03/08/2006   Past Medical History:  Diagnosis Date  . Acute osteomyelitis of calcaneum, left ([place]) 07/10/2019  . Cellulitis of left leg    history of, most recent episode 01/09  . Chronic venous insufficiency   . Diabetes mellitus with neurological manifestation ([place])   . Diabetic foot infection ([place]) 07/10/2019  . Diverticulitis    h/o 2-3 episodes in past  . Group B streptococcal infection 07/10/2019  . Hyperlipidemia LDL goal < 100   . Hypertension goal BP (blood pressure) < 130/80   . Leg edema    secondary to chronic venous insufficiency  . Morbid obesity ([place])   . Osteopenia    DEXA scan 7/07  . Pseudomonas aeruginosa infection 07/10/2019    Family History  Problem Relation Age of Onset  . Heart disease Mother   .  Heart disease Father   . Heart disease Brother   . Cancer Brother   . Cancer Brother   . Hypertension Sister   . Heart disease Sister     Past Surgical History:  Procedure Laterality Date  . ABDOMINAL HYSTERECTOMY    . APPENDECTOMY    . CHOLECYSTECTOMY    . CHOLECYSTECTOMY N/A 04/14/2013   Procedure: LAPAROSCOPIC CHOLECYSTECTOMY WITH INTRAOPERATIVE CHOLANGIOGRAM;  Surgeon: Adin Hector, MD;  Location: [facility];  Service: General;  Laterality: N/A;  . COLECTOMY     with diverting colsotmy and revision in the 1990's for diverticulitis  . ERCP N/A 04/17/2013   Procedure: ENDOSCOPIC RETROGRADE CHOLANGIOPANCREATOGRAPHY (ERCP);  Surgeon: Irene Shipper, MD;  Location: [facility];  Service: Endoscopy;  Laterality: N/A;  note pt want general anesthesia for this case.  preferto perform in endo unit, but if unavailable please arrange time in OR  . EYE  SURGERY  8/13   left cataract removal & retinal repair  . I & D EXTREMITY Left 07/26/2019   Procedure: LEFT PARTIAL CALCANEOUS EXCISION;  Surgeon: [name], MD;  Location: [facility];  Service: Orthopedics;  Laterality: Left;  . INSERTION OF MESH N/A 07/30/2017   Procedure: INSERTION OF MESH;  Surgeon: Jovita Kussmaul, MD;  [facility]: [facility];  Service: General;  Laterality: N/A;  . LAPAROSCOPIC LYSIS OF ADHESIONS N/A 04/14/2013   Procedure: LAPAROSCOPIC LYSIS OF ADHESIONS;  Surgeon: Adin Hector, MD;  [facility]: [facility];  Service: General;  Laterality: N/A;  . LAPAROTOMY N/A 07/30/2017   Procedure: EXPLORATORY LAPAROTOMY;  Surgeon: Jovita Kussmaul, MD;  [facility];  Service: General;  Laterality: N/A;  . LYSIS OF ADHESION N/A 07/30/2017   Procedure: EXTENSIVE LYSIS OF ADHESION;  Surgeon: Jovita Kussmaul, MD;  [facility];  Service: General;  Laterality: N/A;  . OMENTECTOMY N/A 07/30/2017   Procedure: PARTIAL OMENTECTOMY;  Surgeon: Jovita Kussmaul, MD;  [facility];  Service: General;  Laterality: N/A;  . VENTRAL HERNIA REPAIR N/A 07/30/2017    Procedure: HERNIA REPAIR VENTRAL ADULT;  Surgeon: Jovita Kussmaul, MD;  [facility];  Service: General;  Laterality: N/A;   Social History   Occupational History  . Not on file  Tobacco Use  . Smoking status: Never Smoker  . Smokeless tobacco: Never Used  Vaping Use  . Vaping Use: Never used  Substance and Sexual Activity  . Alcohol use: No  . Drug use: No  . Sexual activity: Yes

## 2020-04-04 ENCOUNTER — Ambulatory Visit: Payer: Medicare Other | Admitting: Orthopedic Surgery

## 2020-04-30 ENCOUNTER — Ambulatory Visit (INDEPENDENT_AMBULATORY_CARE_PROVIDER_SITE_OTHER): Payer: Medicare Other | Admitting: Orthopedic Surgery

## 2020-04-30 DIAGNOSIS — L97421 Non-pressure chronic ulcer of left heel and midfoot limited to breakdown of skin: Secondary | ICD-10-CM

## 2020-05-07 ENCOUNTER — Encounter: Payer: Self-pay | Admitting: Orthopedic Surgery

## 2020-05-07 NOTE — Progress Notes (Signed)
Office Visit Note   Patient: Debra [name]           Date of Birth: 10-Dec-1945           MRN: [id number] Visit Date: 04/30/2020              Requested by: Vicenta Aly, [place],  [place] [id number] PCP: Vicenta Aly, FNP  Chief Complaint  Patient presents with  . Left Foot - Follow-up      HPI: Patient is a 75 year old woman who presents for follow-up for left heel ulcer.  Patient feels like she is doing well and states she is ready to be released.  Assessment & Plan: Visit Diagnoses:  1. Non-pressure chronic ulcer of left heel and midfoot limited to breakdown of skin ([place])     Plan: Recommend continue with compression stockings.  Follow-Up Instructions: Return in about 3 months (around 07/28/2020).   Ortho Exam  Patient is alert, oriented, no adenopathy, well-dressed, normal affect, normal respiratory effort. Examination patient's calf is 49 cm in circumference the left heel ulcer has healed.  She did sustain blunt trauma to the left great toe but there is no subungual hematoma the calf ulcer is healed there is brawny skin color changes in her leg.  Imaging: No results found. No images are attached to the encounter.  Labs: Lab Results  Component Value Date   HGBA1C 8.6 (H) 07/22/2019   HGBA1C 8.9 (H) 07/27/2017   HGBA1C 7.6 (H) 05/22/2016   ESRSEDRATE 36 (H) 11/20/2019   ESRSEDRATE 60 (H) 07/28/2019   ESRSEDRATE 22 07/10/2019   CRP 10.9 (H) 11/20/2019   CRP 10.1 (H) 07/28/2019   CRP 3.8 07/10/2019   LABURIC 5.5 11/29/2014   LABURIC 4.6 10/01/2014   LABURIC 5.9 06/09/2013   REPTSTATUS 07/30/2019 FINAL 07/26/2019   GRAMSTAIN  07/26/2019    ABUNDANT WBC PRESENT,BOTH PMN AND MONONUCLEAR ABUNDANT GRAM POSITIVE COCCI FEW GRAM NEGATIVE RODS    CULT  07/26/2019    ABUNDANT GROUP B STREP(S.AGALACTIAE)ISOLATED TESTING AGAINST S. AGALACTIAE NOT ROUTINELY PERFORMED DUE TO PREDICTABILITY OF AMP/PEN/VAN SUSCEPTIBILITY. FEW  STAPHYLOCOCCUS HAEMOLYTICUS ABUNDANT BACTEROIDES FRAGILIS BETA LACTAMASE POSITIVE Performed at [facility], [place] [address], [place], [place] [address]    LABORGA STAPHYLOCOCCUS HAEMOLYTICUS 07/26/2019     Lab Results  Component Value Date   ALBUMIN 3.2 (L) 07/22/2019   ALBUMIN 2.4 (L) 08/02/2017   ALBUMIN 3.1 (L) 07/28/2017   LABURIC 5.5 11/29/2014   LABURIC 4.6 10/01/2014   LABURIC 5.9 06/09/2013    Lab Results  Component Value Date   MG 1.2 (L) 08/02/2017   MG 1.6 (L) 07/29/2017   MG 1.3 (L) 07/27/2017   Lab Results  Component Value Date   VD25OH 28.7 (L) 11/28/2012    No results found for: PREALBUMIN CBC EXTENDED Latest Ref Rng & Units 11/20/2019 07/31/2019 07/30/2019  WBC 3.8 - 10.8 Thousand/uL 5.5 6.9 8.0  RBC 3.80 - 5.10 Million/uL 3.53(L) 3.59(L) 3.58(L)  HGB 11.7 - 15.5 g/dL 10.7(L) 10.6(L) 10.3(L)  HCT 35.0 - 45.0 % 32.6(L) 33.1(L) 33.0(L)  PLT 140 - 400 Thousand/uL 282 298 296  NEUTROABS 1,500 - 7,800 cells/uL 2,602 - -  LYMPHSABS 850 - 3,900 cells/uL 1,771 - -     There is no height or weight on file to calculate BMI.  Orders:  No orders of the defined types were placed in this encounter.  No orders of the defined types were placed in this encounter.  Procedures: No procedures performed  Clinical Data: No additional findings.  ROS:  All other systems negative, except as noted in the HPI. Review of Systems  Objective: Vital Signs: LMP 07/02/1973   Specialty Comments:  No specialty comments available.  PMFS History: Patient Active Problem List   Diagnosis Date Noted  . Cutaneous abscess of left foot   . Osteomyelitis ([place]) 07/22/2019  . Diabetic foot infection ([place]) 07/10/2019  . Acute osteomyelitis of left calcaneus ([place]) 07/10/2019  . Group B streptococcal infection 07/10/2019  . 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 Pseudogout of right knee 08/02/2017  . SBO (small bowel obstruction) ([place]) 07/22/2017   . Abnormal LFTs 03/20/2016  . Abdominal pain, left lower quadrant 07/04/2015  . Type 2 diabetes mellitus with diabetic polyneuropathy ([place]) 10/06/2013  . Hyperlipidemia associated with type 2 diabetes mellitus ([place]) 10/06/2013  . Fungal dermatitis 08/11/2013  . Hyperlipidemia 06/09/2013  . Choledocholithiasis with acute cholecystitis 04/14/2013  . Symptomatic cholelithiasis 04/13/2013  . Neuropathic pain of both legs 04/04/2013  . Severe obesity (BMI >= 40) ([place]) 11/28/2012  . Obesity (BMI 30-39.9) 11/28/2012  . Diabetes mellitus with neurological manifestation ([place])   . Hypertension goal BP (blood pressure) < 130/80   . Osteopenia   . Chronic venous insufficiency   . Leg edema   . Hypertensive retinopathy, grade 2 03/30/2012  . Medial epicondylitis 02/11/2012  . Superficial thrombophlebitis of left leg 07/15/2011  . GERD (gastroesophageal reflux disease) 02/25/2011  . Right knee pain 02/16/2011  . Preventive measure 09/11/2010  . Neck pain 07/03/2010  . Grieving 04/17/2010  . OSTEOPENIA 04/12/2006  . Controlled type 2 diabetes mellitus with complication, with long-term current use of insulin ([place]) 03/08/2006  . MORBID OBESITY 03/08/2006  . Essential hypertension 03/08/2006   Past Medical History:  Diagnosis Date  . Acute osteomyelitis of calcaneum, left ([place]) 07/10/2019  . Cellulitis of left leg    history of, most recent episode 01/09  . Chronic venous insufficiency   . Diabetes mellitus with neurological manifestation ([place])   . Diabetic foot infection ([place]) 07/10/2019  . Diverticulitis    h/o 2-3 episodes in past  . Group B streptococcal infection 07/10/2019  . Hyperlipidemia LDL goal < 100   . Hypertension goal BP (blood pressure) < 130/80   . Leg edema    secondary to chronic venous insufficiency  . Morbid obesity ([place])   . Osteopenia    DEXA scan 7/07  . Pseudomonas aeruginosa infection 07/10/2019    Family History  Problem Relation Age of Onset  . Heart disease  Mother   . Heart disease Father   . Heart disease Brother   . Cancer Brother   . Cancer Brother   . Hypertension Sister   . Heart disease Sister     Past Surgical History:  Procedure Laterality Date  . ABDOMINAL HYSTERECTOMY    . APPENDECTOMY    . CHOLECYSTECTOMY    . CHOLECYSTECTOMY N/A 04/14/2013   Procedure: LAPAROSCOPIC CHOLECYSTECTOMY WITH INTRAOPERATIVE CHOLANGIOGRAM;  Surgeon: Adin Hector, MD;  [facility];  Service: General;  Laterality: N/A;  . COLECTOMY     with diverting colsotmy and revision in the 1990's for diverticulitis  . ERCP N/A 04/17/2013   Procedure: ENDOSCOPIC RETROGRADE CHOLANGIOPANCREATOGRAPHY (ERCP);  Surgeon: Irene Shipper, MD;  Location: [facility];  Service: Endoscopy;  Laterality: N/A;  note pt want general anesthesia for this case.  preferto perform in endo unit, but if unavailable please arrange time  in OR  . EYE SURGERY  8/13   left cataract removal & retinal repair  . I & D EXTREMITY Left 07/26/2019   Procedure: LEFT PARTIAL CALCANEOUS EXCISION;  Surgeon: [name], MD;  Location: [facility];  Service: Orthopedics;  Laterality: Left;  . INSERTION OF MESH N/A 07/30/2017   Procedure: INSERTION OF MESH;  Surgeon: Jovita Kussmaul, MD;  [facility];  Service: General;  Laterality: N/A;  . LAPAROSCOPIC LYSIS OF ADHESIONS N/A 04/14/2013   Procedure: LAPAROSCOPIC LYSIS OF ADHESIONS;  Surgeon: Adin Hector, MD;  [facility];  Service: General;  Laterality: N/A;  . LAPAROTOMY N/A 07/30/2017   Procedure: EXPLORATORY LAPAROTOMY;  Surgeon: Jovita Kussmaul, MD;  [facility];  Service: General;  Laterality: N/A;  . LYSIS OF ADHESION N/A 07/30/2017   Procedure: EXTENSIVE LYSIS OF ADHESION;  Surgeon: Jovita Kussmaul, MD;  [facility];  Service: General;  Laterality: N/A;  . OMENTECTOMY N/A 07/30/2017   Procedure: PARTIAL OMENTECTOMY;  Surgeon: Jovita Kussmaul, MD;  [facility];  Service: General;  Laterality: N/A;  . VENTRAL HERNIA REPAIR  N/A 07/30/2017   Procedure: HERNIA REPAIR VENTRAL ADULT;  Surgeon: Jovita Kussmaul, MD;  [facility];  Service: General;  Laterality: N/A;   Social History   Occupational History  . Not on file  Tobacco Use  . Smoking status: Never Smoker  . Smokeless tobacco: Never Used  Vaping Use  . Vaping Use: Never used  Substance and Sexual Activity  . Alcohol use: No  . Drug use: No  . Sexual activity: Yes

## 2020-07-29 ENCOUNTER — Ambulatory Visit: Payer: Medicare Other | Admitting: Physician Assistant

## 2020-07-29 ENCOUNTER — Encounter: Payer: Self-pay | Admitting: Orthopedic Surgery

## 2020-07-29 DIAGNOSIS — B351 Tinea unguium: Secondary | ICD-10-CM

## 2020-07-29 NOTE — Progress Notes (Signed)
Office Visit Note   Patient: Debra [name]           Date of Birth: 1945-04-15           MRN: [id number] Visit Date: 07/29/2020              Requested by: Vicenta Aly, [place],  [place] [address] PCP: Vicenta Aly, FNP  Chief Complaint  Patient presents with  . Left Foot - Follow-up    Heel ulcer       HPI: Patient presents today for follow-up on her left heel ulcer.  She has been wearing compression socks.  She did buy a larger size as a smaller size was cutting off her circulation she feels she is doing well and examines her feet and her heel every day using a mirror  Assessment & Plan: Visit Diagnoses: No diagnosis found.  Plan: May follow-up as needed.  I did offer to trim her nails again which she can have done in 3 more months  Follow-Up Instructions: No follow-ups on file.   Ortho Exam  Patient is alert, oriented, no adenopathy, well-dressed, normal affect, normal respiratory effort. Bilateral lower extremities no ulcers calves are soft no cellulitis heel is soft without any skin breakdown skin is in excellent condition on the left.  She does have moderate soft tissue swelling but her compartments are soft no ascending cellulitis onychomycotic nailswere trimmed to a stable surface  Imaging: No results found. No images are attached to the encounter.  Labs: Lab Results  Component Value Date   HGBA1C 8.6 (H) 07/22/2019   HGBA1C 8.9 (H) 07/27/2017   HGBA1C 7.6 (H) 05/22/2016   ESRSEDRATE 36 (H) 11/20/2019   ESRSEDRATE 60 (H) 07/28/2019   ESRSEDRATE 22 07/10/2019   CRP 10.9 (H) 11/20/2019   CRP 10.1 (H) 07/28/2019   CRP 3.8 07/10/2019   LABURIC 5.5 11/29/2014   LABURIC 4.6 10/01/2014   LABURIC 5.9 06/09/2013   REPTSTATUS 07/30/2019 FINAL 07/26/2019   GRAMSTAIN  07/26/2019    ABUNDANT WBC PRESENT,BOTH PMN AND MONONUCLEAR ABUNDANT GRAM POSITIVE COCCI FEW GRAM NEGATIVE RODS    CULT  07/26/2019    ABUNDANT GROUP B  STREP(S.AGALACTIAE)ISOLATED TESTING AGAINST S. AGALACTIAE NOT ROUTINELY PERFORMED DUE TO PREDICTABILITY OF AMP/PEN/VAN SUSCEPTIBILITY. FEW STAPHYLOCOCCUS HAEMOLYTICUS ABUNDANT BACTEROIDES FRAGILIS BETA LACTAMASE POSITIVE Performed at [facility], [place] [address] [place], [place] [address]    LABORGA STAPHYLOCOCCUS HAEMOLYTICUS 07/26/2019     Lab Results  Component Value Date   ALBUMIN 3.2 (L) 07/22/2019   ALBUMIN 2.4 (L) 08/02/2017   ALBUMIN 3.1 (L) 07/28/2017    Lab Results  Component Value Date   MG 1.2 (L) 08/02/2017   MG 1.6 (L) 07/29/2017   MG 1.3 (L) 07/27/2017   Lab Results  Component Value Date   VD25OH 28.7 (L) 11/28/2012    No results found for: PREALBUMIN CBC EXTENDED Latest Ref Rng & Units 11/20/2019 07/31/2019 07/30/2019  WBC 3.8 - 10.8 Thousand/uL 5.5 6.9 8.0  RBC 3.80 - 5.10 Million/uL 3.53(L) 3.59(L) 3.58(L)  HGB 11.7 - 15.5 g/dL 10.7(L) 10.6(L) 10.3(L)  HCT 35.0 - 45.0 % 32.6(L) 33.1(L) 33.0(L)  PLT 140 - 400 Thousand/uL 282 298 296  NEUTROABS 1,500 - 7,800 cells/uL 2,602 - -  LYMPHSABS 850 - 3,900 cells/uL 1,771 - -     There is no height or weight on file to calculate BMI.  Orders:  No orders of the defined types were placed in  this encounter.  No orders of the defined types were placed in this encounter.    Procedures: No procedures performed  Clinical Data: No additional findings.  ROS:  All other systems negative, except as noted in the HPI. Review of Systems  Objective: Vital Signs: LMP 07/02/1973   Specialty Comments:  No specialty comments available.  PMFS History: Patient Active Problem List   Diagnosis Date Noted  . Cutaneous abscess of left foot   . Osteomyelitis ([place]) 07/22/2019  . Diabetic foot infection ([place]) 07/10/2019  . Acute osteomyelitis of left calcaneus ([place]) 07/10/2019  . Group B streptococcal infection 07/10/2019  . 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 Pseudogout of right knee  08/02/2017  . SBO (small bowel obstruction) ([place]) 07/22/2017  . Abnormal LFTs 03/20/2016  . Abdominal pain, left lower quadrant 07/04/2015  . Type 2 diabetes mellitus with diabetic polyneuropathy ([place]) 10/06/2013  . Hyperlipidemia associated with type 2 diabetes mellitus ([place]) 10/06/2013  . Fungal dermatitis 08/11/2013  . Hyperlipidemia 06/09/2013  . Choledocholithiasis with acute cholecystitis 04/14/2013  . Symptomatic cholelithiasis 04/13/2013  . Neuropathic pain of both legs 04/04/2013  . Severe obesity (BMI >= 40) ([place]) 11/28/2012  . Obesity (BMI 30-39.9) 11/28/2012  . Diabetes mellitus with neurological manifestation ([place])   . Hypertension goal BP (blood pressure) < 130/80   . Osteopenia   . Chronic venous insufficiency   . Leg edema   . Hypertensive retinopathy, grade 2 03/30/2012  . Medial epicondylitis 02/11/2012  . Superficial thrombophlebitis of left leg 07/15/2011  . GERD (gastroesophageal reflux disease) 02/25/2011  . Right knee pain 02/16/2011  . Preventive measure 09/11/2010  . Neck pain 07/03/2010  . Grieving 04/17/2010  . OSTEOPENIA 04/12/2006  . Controlled type 2 diabetes mellitus with complication, with long-term current use of insulin ([place]) 03/08/2006  . MORBID OBESITY 03/08/2006  . Essential hypertension 03/08/2006   Past Medical History:  Diagnosis Date  . Acute osteomyelitis of calcaneum, left ([place]) 07/10/2019  . Cellulitis of left leg    history of, most recent episode 01/09  . Chronic venous insufficiency   . Diabetes mellitus with neurological manifestation ([place])   . Diabetic foot infection ([place]) 07/10/2019  . Diverticulitis    h/o 2-3 episodes in past  . Group B streptococcal infection 07/10/2019  . Hyperlipidemia LDL goal < 100   . Hypertension goal BP (blood pressure) < 130/80   . Leg edema    secondary to chronic venous insufficiency  . Morbid obesity ([place])   . Osteopenia    DEXA scan 7/07  . Pseudomonas aeruginosa infection 07/10/2019     Family History  Problem Relation Age of Onset  . Heart disease Mother   . Heart disease Father   . Heart disease Brother   . Cancer Brother   . Cancer Brother   . Hypertension Sister   . Heart disease Sister     Past Surgical History:  Procedure Laterality Date  . ABDOMINAL HYSTERECTOMY    . APPENDECTOMY    . CHOLECYSTECTOMY    . CHOLECYSTECTOMY N/A 04/14/2013   Procedure: LAPAROSCOPIC CHOLECYSTECTOMY WITH INTRAOPERATIVE CHOLANGIOGRAM;  Surgeon: Adin Hector, MD;  Location: [facility];  Service: General;  Laterality: N/A;  . COLECTOMY     with diverting colsotmy and revision in the 1990's for diverticulitis  . ERCP N/A 04/17/2013   Procedure: ENDOSCOPIC RETROGRADE CHOLANGIOPANCREATOGRAPHY (ERCP);  Surgeon: Irene Shipper, MD;  Location: [facility];  Service: Endoscopy;  Laterality: N/A;  note pt want  general anesthesia for this case.  preferto perform in endo unit, but if unavailable please arrange time in OR  . EYE SURGERY  8/13   left cataract removal & retinal repair  . I & D EXTREMITY Left 07/26/2019   Procedure: LEFT PARTIAL CALCANEOUS EXCISION;  Surgeon: [name], MD;  Location: [facility];  Service: Orthopedics;  Laterality: Left;  . INSERTION OF MESH N/A 07/30/2017   Procedure: INSERTION OF MESH;  Surgeon: Jovita Kussmaul, MD;  [facility];  Service: General;  Laterality: N/A;  . LAPAROSCOPIC LYSIS OF ADHESIONS N/A 04/14/2013   Procedure: LAPAROSCOPIC LYSIS OF ADHESIONS;  Surgeon: Adin Hector, MD;  [facility];  Service: General;  Laterality: N/A;  . LAPAROTOMY N/A 07/30/2017   Procedure: EXPLORATORY LAPAROTOMY;  Surgeon: Jovita Kussmaul, MD;  [facility];  Service: General;  Laterality: N/A;  . LYSIS OF ADHESION N/A 07/30/2017   Procedure: EXTENSIVE LYSIS OF ADHESION;  Surgeon: Jovita Kussmaul, MD;  [facility];  Service: General;  Laterality: N/A;  . OMENTECTOMY N/A 07/30/2017   Procedure: PARTIAL OMENTECTOMY;  Surgeon: Jovita Kussmaul, MD;  [facility];  Service: General;  Laterality: N/A;  . VENTRAL HERNIA REPAIR N/A 07/30/2017   Procedure: HERNIA REPAIR VENTRAL ADULT;  Surgeon: Jovita Kussmaul, MD;  [facility];  Service: General;  Laterality: N/A;   Social History   Occupational History  . Not on file  Tobacco Use  . Smoking status: Never Smoker  . Smokeless tobacco: Never Used  Vaping Use  . Vaping Use: Never used  Substance and Sexual Activity  . Alcohol use: No  . Drug use: No  . Sexual activity: Yes

## 2020-09-02 ENCOUNTER — Ambulatory Visit: Payer: Medicare Other | Admitting: Orthopedic Surgery

## 2020-09-05 ENCOUNTER — Encounter: Payer: Self-pay | Admitting: Orthopedic Surgery

## 2020-09-05 ENCOUNTER — Ambulatory Visit: Payer: Medicare Other | Admitting: Orthopedic Surgery

## 2020-09-05 DIAGNOSIS — I87333 Chronic venous hypertension (idiopathic) with ulcer and inflammation of bilateral lower extremity: Secondary | ICD-10-CM

## 2020-09-05 DIAGNOSIS — L97919 Non-pressure chronic ulcer of unspecified part of right lower leg with unspecified severity: Secondary | ICD-10-CM | POA: Diagnosis not present

## 2020-09-05 DIAGNOSIS — L97929 Non-pressure chronic ulcer of unspecified part of left lower leg with unspecified severity: Secondary | ICD-10-CM | POA: Diagnosis not present

## 2020-09-05 NOTE — Progress Notes (Signed)
Office Visit Note   Patient: Debra [name]           Date of Birth: [birth year]           MRN: [id number] Visit Date: 09/05/2020              Requested by: Vicenta Aly, [place],  [place] [address] PCP: Vicenta Aly, FNP  Chief Complaint  Patient presents with   Right Foot - Follow-up   Left Foot - Follow-up      HPI:  Patient is a 75 year old woman who presents with increasing pain in the little toe both feet she states she has increased swelling in both lower extremities with new ulcers on her left leg.  Patient states she did get a new pair of knee-high 15 to 20 mm compression stocking size 2 extra-large. Assessment & Plan: Visit Diagnoses:  1. Chronic venous hypertension (idiopathic) with ulcer and inflammation of bilateral lower extremity (HCC)     Plan: We will apply a Dynaflex wrap to both legs.  We will continue serial compression until we can get her leg small enough to resolve the ulcers resolve the pain and then get her into a smaller pair of compression stockings.  Follow-Up Instructions: Return in about 1 week (around 09/12/2020).   Ortho Exam  Patient is alert, oriented, no adenopathy, well-dressed, normal affect, normal respiratory effort. Examination I cannot palpate a dorsalis pedis pulse bilaterally secondary to the swelling she has massive swelling in the foot ankle and leg she has new ulcers on the left calf from the venous insufficiency there is no cellulitis in either leg.  The right calf measures 46 cm in circumference the left calf measures 50 cm in circumference.  There is no cellulitis no plantar ulcers her foot is plantigrade.  Patient states she sits primarily with her legs dependent.  Imaging: No results found. No images are attached to the encounter.  Labs: Lab Results  Component Value Date   HGBA1C 8.6 (H) 07/22/2019   HGBA1C 8.9 (H) 07/27/2017   HGBA1C 7.6 (H) 05/22/2016   ESRSEDRATE 36 (H) 11/20/2019    ESRSEDRATE 60 (H) 07/28/2019   ESRSEDRATE 22 07/10/2019   CRP 10.9 (H) 11/20/2019   CRP 10.1 (H) 07/28/2019   CRP 3.8 07/10/2019   LABURIC 5.5 11/29/2014   LABURIC 4.6 10/01/2014   LABURIC 5.9 06/09/2013   REPTSTATUS 07/30/2019 FINAL 07/26/2019   GRAMSTAIN  07/26/2019    ABUNDANT WBC PRESENT,BOTH PMN AND MONONUCLEAR ABUNDANT GRAM POSITIVE COCCI FEW GRAM NEGATIVE RODS    CULT  07/26/2019    ABUNDANT GROUP B STREP(S.AGALACTIAE)ISOLATED TESTING AGAINST S. AGALACTIAE NOT ROUTINELY PERFORMED DUE TO PREDICTABILITY OF AMP/PEN/VAN SUSCEPTIBILITY. FEW STAPHYLOCOCCUS HAEMOLYTICUS ABUNDANT BACTEROIDES FRAGILIS BETA LACTAMASE POSITIVE Performed at [facility], [place] [address] [place], [place] [address]    LABORGA STAPHYLOCOCCUS HAEMOLYTICUS 07/26/2019     Lab Results  Component Value Date   ALBUMIN 3.2 (L) 07/22/2019   ALBUMIN 2.4 (L) 08/02/2017   ALBUMIN 3.1 (L) 07/28/2017    Lab Results  Component Value Date   MG 1.2 (L) 08/02/2017   MG 1.6 (L) 07/29/2017   MG 1.3 (L) 07/27/2017   Lab Results  Component Value Date   VD25OH 28.7 (L) 11/28/2012    No results found for: PREALBUMIN CBC EXTENDED Latest Ref Rng & Units 11/20/2019 07/31/2019 07/30/2019  WBC 3.8 - 10.8 Thousand/uL 5.5 6.9 8.0  RBC 3.80 - 5.10 Million/uL 3.53(L) 3.59(L) 3.58(L)  HGB 11.7 - 15.5 g/dL 10.7(L) 10.6(L) 10.3(L)  HCT 35.0 - 45.0 % 32.6(L) 33.1(L) 33.0(L)  PLT 140 - 400 Thousand/uL 282 298 296  NEUTROABS 1,500 - 7,800 cells/uL 2,602 - -  LYMPHSABS 850 - 3,900 cells/uL 1,771 - -     There is no height or weight on file to calculate BMI.  Orders:  No orders of the defined types were placed in this encounter.  No orders of the defined types were placed in this encounter.    Procedures: No procedures performed  Clinical Data: No additional findings.  ROS:  All other systems negative, except as noted in the HPI. Review of Systems  Objective: Vital Signs: LMP 07/02/1973   Specialty  Comments:  No specialty comments available.  PMFS History: Patient Active Problem List   Diagnosis Date Noted   Cutaneous abscess of left foot    Osteomyelitis ([place]) 07/22/2019   Diabetic foot infection ([place]) 07/10/2019   Acute osteomyelitis of left calcaneus ([place]) 07/10/2019   Group B streptococcal infection 07/10/2019   Bacterial infection due to Morganella morganii 07/10/2019   Pseudogout of right knee 08/02/2017   SBO (small bowel obstruction) ([place]) 07/22/2017   Abnormal LFTs 03/20/2016   Abdominal pain, left lower quadrant 07/04/2015   Type 2 diabetes mellitus with diabetic polyneuropathy ([place]) 10/06/2013   Hyperlipidemia associated with type 2 diabetes mellitus ([place]) 10/06/2013   Fungal dermatitis 08/11/2013   Hyperlipidemia 06/09/2013   Choledocholithiasis with acute cholecystitis 04/14/2013   Symptomatic cholelithiasis 04/13/2013   Neuropathic pain of both legs 04/04/2013   Severe obesity (BMI >= 40) (HCC) 11/28/2012   Obesity (BMI 30-39.9) 11/28/2012   Diabetes mellitus with neurological manifestation (HCC)    Hypertension goal BP (blood pressure) < 130/80    Osteopenia    Chronic venous insufficiency    Leg edema    Hypertensive retinopathy, grade 2 03/30/2012   Medial epicondylitis 02/11/2012   Superficial thrombophlebitis of left leg 07/15/2011   GERD (gastroesophageal reflux disease) 02/25/2011   Right knee pain 02/16/2011   Preventive measure 09/11/2010   Neck pain 07/03/2010   Grieving 04/17/2010   OSTEOPENIA 04/12/2006   Controlled type 2 diabetes mellitus with complication, with long-term current use of insulin ([place]) 03/08/2006   MORBID OBESITY 03/08/2006   Essential hypertension 03/08/2006   Past Medical History:  Diagnosis Date   Acute osteomyelitis of calcaneum, left ([place]) 07/10/2019   Cellulitis of left leg    history of, most recent episode 01/09   Chronic venous insufficiency    Diabetes mellitus with neurological manifestation (HCC)    Diabetic  foot infection ([place]) 07/10/2019   Diverticulitis    h/o 2-3 episodes in past   Group B streptococcal infection 07/10/2019   Hyperlipidemia LDL goal < 100    Hypertension goal BP (blood pressure) < 130/80    Leg edema    secondary to chronic venous insufficiency   Morbid obesity ([place])    Osteopenia    DEXA scan 7/07   Pseudomonas aeruginosa infection 07/10/2019    Family History  Problem Relation Age of Onset   Heart disease Mother    Heart disease Father    Heart disease Brother    Cancer Brother    Cancer Brother    Hypertension Sister    Heart disease Sister     Past Surgical History:  Procedure Laterality Date   ABDOMINAL HYSTERECTOMY     APPENDECTOMY     CHOLECYSTECTOMY     CHOLECYSTECTOMY N/A 04/14/2013  Procedure: LAPAROSCOPIC CHOLECYSTECTOMY WITH INTRAOPERATIVE CHOLANGIOGRAM;  Surgeon: Adin Hector, MD;  [facility];  Service: General;  Laterality: N/A;   COLECTOMY     with diverting colsotmy and revision in the 1990's for diverticulitis   ERCP N/A 04/17/2013   Procedure: ENDOSCOPIC RETROGRADE CHOLANGIOPANCREATOGRAPHY (ERCP);  Surgeon: Irene Shipper, MD;  Location: [facility];  Service: Endoscopy;  Laterality: N/A;  note pt want general anesthesia for this case.  preferto perform in endo unit, but if unavailable please arrange time in [place]  8/13   left cataract removal & retinal repair   I & D EXTREMITY Left 07/26/2019   Procedure: LEFT PARTIAL CALCANEOUS EXCISION;  Surgeon: [name], MD;  Location: [facility];  Service: Orthopedics;  Laterality: Left;   INSERTION OF MESH N/A 07/30/2017   Procedure: INSERTION OF MESH;  Surgeon: Jovita Kussmaul, MD;  [facility];  Service: General;  Laterality: N/A;   LAPAROSCOPIC LYSIS OF ADHESIONS N/A 04/14/2013   Procedure: LAPAROSCOPIC LYSIS OF ADHESIONS;  Surgeon: Adin Hector, MD;  [facility];  Service: General;  Laterality: N/A;   LAPAROTOMY N/A 07/30/2017   Procedure: EXPLORATORY LAPAROTOMY;   Surgeon: Jovita Kussmaul, MD;  [facility];  Service: General;  Laterality: N/A;   LYSIS OF ADHESION N/A 07/30/2017   Procedure: EXTENSIVE LYSIS OF ADHESION;  Surgeon: Jovita Kussmaul, MD;  [facility];  Service: General;  Laterality: N/A;   OMENTECTOMY N/A 07/30/2017   Procedure: PARTIAL OMENTECTOMY;  Surgeon: Jovita Kussmaul, MD;  [facility];  Service: General;  Laterality: N/A;   VENTRAL HERNIA REPAIR N/A 07/30/2017   Procedure: HERNIA REPAIR VENTRAL ADULT;  Surgeon: Jovita Kussmaul, MD;  [facility];  Service: General;  Laterality: N/A;   Social History   Occupational History   Not on file  Tobacco Use   Smoking status: Never   Smokeless tobacco: Never  Vaping Use   Vaping Use: Never used  Substance and Sexual Activity   Alcohol use: No   Drug use: No   Sexual activity: Yes

## 2020-09-12 ENCOUNTER — Ambulatory Visit: Payer: Medicare Other | Admitting: Orthopedic Surgery

## 2020-09-27 ENCOUNTER — Other Ambulatory Visit: Payer: Self-pay

## 2020-09-27 ENCOUNTER — Ambulatory Visit: Payer: Medicare Other | Admitting: Physician Assistant

## 2020-09-27 ENCOUNTER — Encounter: Payer: Self-pay | Admitting: Physician Assistant

## 2020-09-27 DIAGNOSIS — M79672 Pain in left foot: Secondary | ICD-10-CM

## 2020-09-27 NOTE — Progress Notes (Signed)
Office Visit Note   Patient: Debra [name]           Date of Birth: Oct 22, 1945           MRN: [id number] Visit Date: 09/27/2020              Requested by: Vicenta Aly, [place],  [place] [address] PCP: Vicenta Aly, FNP  No chief complaint on file.     HPI: Patient is a pleasant 75 year old woman with history of ulcers on her lower extremities in the past.  She comes in today because she stepped on a stick pen at home.  She had a significant amount of bleeding at the time and wanted to make sure she did not have an infection.  Assessment & Plan: Visit Diagnoses: No diagnosis found.  Plan: Reassurance was given no signs of infection patient may follow-up if any increasing symptoms or new symptoms arise  Follow-Up Instructions: No follow-ups on file.   Ortho Exam  Patient is alert, oriented, no adenopathy, well-dressed, normal affect, normal respiratory effort. Examination of the plantar surface of her foot her left foot there is no erythema no open drainage 1 area of a scratch but no surrounding cellulitis or signs of infection she does have 1 thickened callus at the heel which was debrided to healthy soft skin.  No ascending cellulitis  Imaging: No results found. No images are attached to the encounter.  Labs: Lab Results  Component Value Date   HGBA1C 8.6 (H) 07/22/2019   HGBA1C 8.9 (H) 07/27/2017   HGBA1C 7.6 (H) 05/22/2016   ESRSEDRATE 36 (H) 11/20/2019   ESRSEDRATE 60 (H) 07/28/2019   ESRSEDRATE 22 07/10/2019   CRP 10.9 (H) 11/20/2019   CRP 10.1 (H) 07/28/2019   CRP 3.8 07/10/2019   LABURIC 5.5 11/29/2014   LABURIC 4.6 10/01/2014   LABURIC 5.9 06/09/2013   REPTSTATUS 07/30/2019 FINAL 07/26/2019   GRAMSTAIN  07/26/2019    ABUNDANT WBC PRESENT,BOTH PMN AND MONONUCLEAR ABUNDANT GRAM POSITIVE COCCI FEW GRAM NEGATIVE RODS    CULT  07/26/2019    ABUNDANT GROUP B STREP(S.AGALACTIAE)ISOLATED TESTING AGAINST S. AGALACTIAE  NOT ROUTINELY PERFORMED DUE TO PREDICTABILITY OF AMP/PEN/VAN SUSCEPTIBILITY. FEW STAPHYLOCOCCUS HAEMOLYTICUS ABUNDANT BACTEROIDES FRAGILIS BETA LACTAMASE POSITIVE Performed at [facility], [place] [address], [place], [place] [address]    LABORGA STAPHYLOCOCCUS HAEMOLYTICUS 07/26/2019     Lab Results  Component Value Date   ALBUMIN 3.2 (L) 07/22/2019   ALBUMIN 2.4 (L) 08/02/2017   ALBUMIN 3.1 (L) 07/28/2017    Lab Results  Component Value Date   MG 1.2 (L) 08/02/2017   MG 1.6 (L) 07/29/2017   MG 1.3 (L) 07/27/2017   Lab Results  Component Value Date   VD25OH 28.7 (L) 11/28/2012    No results found for: PREALBUMIN CBC EXTENDED Latest Ref Rng & Units 11/20/2019 07/31/2019 07/30/2019  WBC 3.8 - 10.8 Thousand/uL 5.5 6.9 8.0  RBC 3.80 - 5.10 Million/uL 3.53(L) 3.59(L) 3.58(L)  HGB 11.7 - 15.5 g/dL 10.7(L) 10.6(L) 10.3(L)  HCT 35.0 - 45.0 % 32.6(L) 33.1(L) 33.0(L)  PLT 140 - 400 Thousand/uL 282 298 296  NEUTROABS 1,500 - 7,800 cells/uL 2,602 - -  LYMPHSABS 850 - 3,900 cells/uL 1,771 - -     There is no height or weight on file to calculate BMI.  Orders:  No orders of the defined types were placed in this encounter.  No orders of the defined types were placed in this  encounter.    Procedures: No procedures performed  Clinical Data: No additional findings.  ROS:  All other systems negative, except as noted in the HPI. Review of Systems  Objective: Vital Signs: LMP 07/02/1973   Specialty Comments:  No specialty comments available.  PMFS History: Patient Active Problem List   Diagnosis Date Noted   Cutaneous abscess of left foot    Osteomyelitis ([place]) 07/22/2019   Diabetic foot infection ([place]) 07/10/2019   Acute osteomyelitis of left calcaneus ([place]) 07/10/2019   Group B streptococcal infection 07/10/2019   Bacterial infection due to Morganella morganii 07/10/2019   Pseudogout of right knee 08/02/2017   SBO (small bowel obstruction) ([place]) 07/22/2017    Abnormal LFTs 03/20/2016   Abdominal pain, left lower quadrant 07/04/2015   Type 2 diabetes mellitus with diabetic polyneuropathy ([place]) 10/06/2013   Hyperlipidemia associated with type 2 diabetes mellitus ([place]) 10/06/2013   Fungal dermatitis 08/11/2013   Hyperlipidemia 06/09/2013   Choledocholithiasis with acute cholecystitis 04/14/2013   Symptomatic cholelithiasis 04/13/2013   Neuropathic pain of both legs 04/04/2013   Severe obesity (BMI >= 40) (HCC) 11/28/2012   Obesity (BMI 30-39.9) 11/28/2012   Diabetes mellitus with neurological manifestation (HCC)    Hypertension goal BP (blood pressure) < 130/80    Osteopenia    Chronic venous insufficiency    Leg edema    Hypertensive retinopathy, grade 2 03/30/2012   Medial epicondylitis 02/11/2012   Superficial thrombophlebitis of left leg 07/15/2011   GERD (gastroesophageal reflux disease) 02/25/2011   Right knee pain 02/16/2011   Preventive measure 09/11/2010   Neck pain 07/03/2010   Grieving 04/17/2010   OSTEOPENIA 04/12/2006   Controlled type 2 diabetes mellitus with complication, with long-term current use of insulin ([place]) 03/08/2006   MORBID OBESITY 03/08/2006   Essential hypertension 03/08/2006   Past Medical History:  Diagnosis Date   Acute osteomyelitis of calcaneum, left ([place]) 07/10/2019   Cellulitis of left leg    history of, most recent episode 01/09   Chronic venous insufficiency    Diabetes mellitus with neurological manifestation (HCC)    Diabetic foot infection ([place]) 07/10/2019   Diverticulitis    h/o 2-3 episodes in past   Group B streptococcal infection 07/10/2019   Hyperlipidemia LDL goal < 100    Hypertension goal BP (blood pressure) < 130/80    Leg edema    secondary to chronic venous insufficiency   Morbid obesity ([place])    Osteopenia    DEXA scan 7/07   Pseudomonas aeruginosa infection 07/10/2019    Family History  Problem Relation Age of Onset   Heart disease Mother    Heart disease Father    Heart  disease Brother    Cancer Brother    Cancer Brother    Hypertension Sister    Heart disease Sister     Past Surgical History:  Procedure Laterality Date   ABDOMINAL HYSTERECTOMY     APPENDECTOMY     CHOLECYSTECTOMY     CHOLECYSTECTOMY N/A 04/14/2013   Procedure: LAPAROSCOPIC CHOLECYSTECTOMY WITH INTRAOPERATIVE CHOLANGIOGRAM;  Surgeon: Adin Hector, MD;  Location: [facility];  Service: General;  Laterality: N/A;   COLECTOMY     with diverting colsotmy and revision in the 1990's for diverticulitis   ERCP N/A 04/17/2013   Procedure: ENDOSCOPIC RETROGRADE CHOLANGIOPANCREATOGRAPHY (ERCP);  Surgeon: Irene Shipper, MD;  Location: [facility];  Service: Endoscopy;  Laterality: N/A;  note pt want general anesthesia for this case.  preferto perform in endo unit, but if  unavailable please arrange time in [place]  8/13   left cataract removal & retinal repair   I & D EXTREMITY Left 07/26/2019   Procedure: LEFT PARTIAL CALCANEOUS EXCISION;  Surgeon: [name], MD;  Location: [facility];  Service: Orthopedics;  Laterality: Left;   INSERTION OF MESH N/A 07/30/2017   Procedure: INSERTION OF MESH;  Surgeon: Jovita Kussmaul, MD;  [facility];  Service: General;  Laterality: N/A;   LAPAROSCOPIC LYSIS OF ADHESIONS N/A 04/14/2013   Procedure: LAPAROSCOPIC LYSIS OF ADHESIONS;  Surgeon: Adin Hector, MD;  [facility];  Service: General;  Laterality: N/A;   LAPAROTOMY N/A 07/30/2017   Procedure: EXPLORATORY LAPAROTOMY;  Surgeon: Jovita Kussmaul, MD;  [facility];  Service: General;  Laterality: N/A;   LYSIS OF ADHESION N/A 07/30/2017   Procedure: EXTENSIVE LYSIS OF ADHESION;  Surgeon: Jovita Kussmaul, MD;  [facility];  Service: General;  Laterality: N/A;   OMENTECTOMY N/A 07/30/2017   Procedure: PARTIAL OMENTECTOMY;  Surgeon: Jovita Kussmaul, MD;  [facility];  Service: General;  Laterality: N/A;   VENTRAL HERNIA REPAIR N/A 07/30/2017   Procedure: HERNIA REPAIR VENTRAL ADULT;   Surgeon: Jovita Kussmaul, MD;  [facility];  Service: General;  Laterality: N/A;   Social History   Occupational History   Not on file  Tobacco Use   Smoking status: Never   Smokeless tobacco: Never  Vaping Use   Vaping Use: Never used  Substance and Sexual Activity   Alcohol use: No   Drug use: No   Sexual activity: Yes

## 2021-04-09 ENCOUNTER — Ambulatory Visit: Payer: Medicare Other | Admitting: Family

## 2021-04-09 ENCOUNTER — Encounter: Payer: Self-pay | Admitting: Family

## 2021-04-09 DIAGNOSIS — B351 Tinea unguium: Secondary | ICD-10-CM | POA: Diagnosis not present

## 2021-04-09 NOTE — Progress Notes (Signed)
Office Visit Note   Patient: Debra [name]           Date of Birth: [birth year]           MRN: [id number] Visit Date: 04/09/2021              Requested by: Vicenta Aly, [place],  [place] [address] PCP: Vicenta Aly, FNP  Chief Complaint  Patient presents with   Right Foot - Follow-up    Toenail trim   Left Foot - Follow-up      HPI: The patient is a 76 year old woman who presents today in follow-up for bilateral knee and nail trim.  She is unable to safely trim her own nails. She has been wearing compression garments that are 15 to 20 mmHg these are the only ones she can don.  Assessment & Plan: Visit Diagnoses: No diagnosis found.  Plan: Nails trimmed x10.  Patient tolerated well.  She will follow-up in the office in 3 more months for routine foot check and nail trim.  She will continue with her 15 to 20 mm compression stockings daily.  Follow-Up Instructions: Return in about 3 months (around 07/08/2021).   Ortho Exam  Patient is alert, oriented, no adenopathy, well-dressed, normal affect, normal respiratory effort. On examination bilateral feet there is no erythema no ulcer.  She does have thickened and discolored onychomycotic nails x10.  These were trimmed with her consent today in the office.  Patient tolerated well.  Imaging: No results found. No images are attached to the encounter.  Labs: Lab Results  Component Value Date   HGBA1C 8.6 (H) 07/22/2019   HGBA1C 8.9 (H) 07/27/2017   HGBA1C 7.6 (H) 05/22/2016   ESRSEDRATE 36 (H) 11/20/2019   ESRSEDRATE 60 (H) 07/28/2019   ESRSEDRATE 22 07/10/2019   CRP 10.9 (H) 11/20/2019   CRP 10.1 (H) 07/28/2019   CRP 3.8 07/10/2019   LABURIC 5.5 11/29/2014   LABURIC 4.6 10/01/2014   LABURIC 5.9 06/09/2013   REPTSTATUS 07/30/2019 FINAL 07/26/2019   GRAMSTAIN  07/26/2019    ABUNDANT WBC PRESENT,BOTH PMN AND MONONUCLEAR ABUNDANT GRAM POSITIVE COCCI FEW GRAM NEGATIVE RODS    CULT   07/26/2019    ABUNDANT GROUP B STREP(S.AGALACTIAE)ISOLATED TESTING AGAINST S. AGALACTIAE NOT ROUTINELY PERFORMED DUE TO PREDICTABILITY OF AMP/PEN/VAN SUSCEPTIBILITY. FEW STAPHYLOCOCCUS HAEMOLYTICUS ABUNDANT BACTEROIDES FRAGILIS BETA LACTAMASE POSITIVE Performed at [facility], [place] [address] [place], [place] [address]    LABORGA STAPHYLOCOCCUS HAEMOLYTICUS 07/26/2019     Lab Results  Component Value Date   ALBUMIN 3.2 (L) 07/22/2019   ALBUMIN 2.4 (L) 08/02/2017   ALBUMIN 3.1 (L) 07/28/2017    Lab Results  Component Value Date   MG 1.2 (L) 08/02/2017   MG 1.6 (L) 07/29/2017   MG 1.3 (L) 07/27/2017   Lab Results  Component Value Date   VD25OH 28.7 (L) 11/28/2012    No results found for: PREALBUMIN CBC EXTENDED Latest Ref Rng & Units 11/20/2019 07/31/2019 07/30/2019  WBC 3.8 - 10.8 Thousand/uL 5.5 6.9 8.0  RBC 3.80 - 5.10 Million/uL 3.53(L) 3.59(L) 3.58(L)  HGB 11.7 - 15.5 g/dL 10.7(L) 10.6(L) 10.3(L)  HCT 35.0 - 45.0 % 32.6(L) 33.1(L) 33.0(L)  PLT 140 - 400 Thousand/uL 282 298 296  NEUTROABS 1,500 - 7,800 cells/uL 2,602 - -  LYMPHSABS 850 - 3,900 cells/uL 1,771 - -     There is no height or weight on file to calculate BMI.  Orders:  No orders  of the defined types were placed in this encounter.  No orders of the defined types were placed in this encounter.    Procedures: No procedures performed  Clinical Data: No additional findings.  ROS:  All other systems negative, except as noted in the HPI. Review of Systems  Objective: Vital Signs: LMP 07/02/1973   Specialty Comments:  No specialty comments available.  PMFS History: Patient Active Problem List   Diagnosis Date Noted   Cutaneous abscess of left foot    Osteomyelitis ([place]) 07/22/2019   Diabetic foot infection ([place]) 07/10/2019   Acute osteomyelitis of left calcaneus ([place]) 07/10/2019   Group B streptococcal infection 07/10/2019   Bacterial infection due to Morganella morganii 07/10/2019    Pseudogout of right knee 08/02/2017   SBO (small bowel obstruction) ([place]) 07/22/2017   Abnormal LFTs 03/20/2016   Abdominal pain, left lower quadrant 07/04/2015   Type 2 diabetes mellitus with diabetic polyneuropathy ([place]) 10/06/2013   Hyperlipidemia associated with type 2 diabetes mellitus ([place]) 10/06/2013   Fungal dermatitis 08/11/2013   Hyperlipidemia 06/09/2013   Choledocholithiasis with acute cholecystitis 04/14/2013   Symptomatic cholelithiasis 04/13/2013   Neuropathic pain of both legs 04/04/2013   Severe obesity (BMI >= 40) (HCC) 11/28/2012   Obesity (BMI 30-39.9) 11/28/2012   Diabetes mellitus with neurological manifestation (HCC)    Hypertension goal BP (blood pressure) < 130/80    Osteopenia    Chronic venous insufficiency    Leg edema    Hypertensive retinopathy, grade 2 03/30/2012   Medial epicondylitis 02/11/2012   Superficial thrombophlebitis of left leg 07/15/2011   GERD (gastroesophageal reflux disease) 02/25/2011   Right knee pain 02/16/2011   Preventive measure 09/11/2010   Neck pain 07/03/2010   Grieving 04/17/2010   OSTEOPENIA 04/12/2006   Controlled type 2 diabetes mellitus with complication, with long-term current use of insulin ([place]) 03/08/2006   MORBID OBESITY 03/08/2006   Essential hypertension 03/08/2006   Past Medical History:  Diagnosis Date   Acute osteomyelitis of calcaneum, left ([place]) 07/10/2019   Cellulitis of left leg    history of, most recent episode 01/09   Chronic venous insufficiency    Diabetes mellitus with neurological manifestation (HCC)    Diabetic foot infection ([place]) 07/10/2019   Diverticulitis    h/o 2-3 episodes in past   Group B streptococcal infection 07/10/2019   Hyperlipidemia LDL goal < 100    Hypertension goal BP (blood pressure) < 130/80    Leg edema    secondary to chronic venous insufficiency   Morbid obesity ([place])    Osteopenia    DEXA scan 7/07   Pseudomonas aeruginosa infection 07/10/2019    Family History   Problem Relation Age of Onset   Heart disease Mother    Heart disease Father    Heart disease Brother    Cancer Brother    Cancer Brother    Hypertension Sister    Heart disease Sister     Past Surgical History:  Procedure Laterality Date   ABDOMINAL HYSTERECTOMY     APPENDECTOMY     CHOLECYSTECTOMY     CHOLECYSTECTOMY N/A 04/14/2013   Procedure: LAPAROSCOPIC CHOLECYSTECTOMY WITH INTRAOPERATIVE CHOLANGIOGRAM;  Surgeon: Adin Hector, MD;  [facility];  Service: General;  Laterality: N/A;   COLECTOMY     with diverting colsotmy and revision in the 1990's for diverticulitis   ERCP N/A 04/17/2013   Procedure: ENDOSCOPIC RETROGRADE CHOLANGIOPANCREATOGRAPHY (ERCP);  Surgeon: Irene Shipper, MD;  Location: [facility];  Service: Endoscopy;  Laterality: N/A;  note pt want general anesthesia for this case.  preferto perform in endo unit, but if unavailable please arrange time in [place]  8/13   left cataract removal & retinal repair   I & D EXTREMITY Left 07/26/2019   Procedure: LEFT PARTIAL CALCANEOUS EXCISION;  Surgeon: [name], MD;  Location: [facility];  Service: Orthopedics;  Laterality: Left;   INSERTION OF MESH N/A 07/30/2017   Procedure: INSERTION OF MESH;  Surgeon: Jovita Kussmaul, MD;  [facility];  Service: General;  Laterality: N/A;   LAPAROSCOPIC LYSIS OF ADHESIONS N/A 04/14/2013   Procedure: LAPAROSCOPIC LYSIS OF ADHESIONS;  Surgeon: Adin Hector, MD;  [facility];  Service: General;  Laterality: N/A;   LAPAROTOMY N/A 07/30/2017   Procedure: EXPLORATORY LAPAROTOMY;  Surgeon: Jovita Kussmaul, MD;  [facility];  Service: General;  Laterality: N/A;   LYSIS OF ADHESION N/A 07/30/2017   Procedure: EXTENSIVE LYSIS OF ADHESION;  Surgeon: Jovita Kussmaul, MD;  [facility];  Service: General;  Laterality: N/A;   OMENTECTOMY N/A 07/30/2017   Procedure: PARTIAL OMENTECTOMY;  Surgeon: Jovita Kussmaul, MD;  [facility];  Service: General;  Laterality:  N/A;   VENTRAL HERNIA REPAIR N/A 07/30/2017   Procedure: HERNIA REPAIR VENTRAL ADULT;  Surgeon: Jovita Kussmaul, MD;  [facility];  Service: General;  Laterality: N/A;   Social History   Occupational History   Not on file  Tobacco Use   Smoking status: Never   Smokeless tobacco: Never  Vaping Use   Vaping Use: Never used  Substance and Sexual Activity   Alcohol use: No   Drug use: No   Sexual activity: Yes

## 2021-07-24 ENCOUNTER — Encounter: Payer: Self-pay | Admitting: Orthopedic Surgery

## 2021-07-24 ENCOUNTER — Ambulatory Visit: Payer: Self-pay

## 2021-07-24 ENCOUNTER — Ambulatory Visit (INDEPENDENT_AMBULATORY_CARE_PROVIDER_SITE_OTHER): Payer: Medicare Other

## 2021-07-24 ENCOUNTER — Ambulatory Visit (INDEPENDENT_AMBULATORY_CARE_PROVIDER_SITE_OTHER): Payer: Medicare Other | Admitting: Orthopedic Surgery

## 2021-07-24 DIAGNOSIS — M25561 Pain in right knee: Secondary | ICD-10-CM

## 2021-07-24 DIAGNOSIS — G8929 Other chronic pain: Secondary | ICD-10-CM

## 2021-07-24 DIAGNOSIS — M25562 Pain in left knee: Secondary | ICD-10-CM

## 2021-07-24 NOTE — Progress Notes (Signed)
? ?Office Visit Note ?  ?Patient: Debra [name]           ?Date of Birth: 10/03/1945           ?MRN: [id number] ?Visit Date: 07/24/2021 ?             ?Requested by: Vicenta Aly, [place] [address],  [place] [address] ?PCP: Vicenta Aly, [place] ? ?Chief Complaint  ?Patient presents with  ? Left Knee - Pain  ? ? ? ? ?HPI: ?Patient is a 75 year old woman who is seen for evaluation for chronic bilateral knee degenerative arthritis with progressive pain right worse than left.  Patient states she had a fall this January when she lost her balance.  Patient uses a cane for ambulation.  Patient has had therapy in the past without relief. ? ?Assessment & Plan: ?Visit Diagnoses:  ?1. Bilateral chronic knee pain   ? ? ?Plan: Both knees were injected follow-up in 4 weeks.  Patient has a difficult problem.  With her elevated BMI she is extremely high risk for surgery and with the mechanical malalignment patient would require a revision type total knee arthroplasty. ? ?Follow-Up Instructions: Return in about 4 weeks (around 08/21/2021).  ? ?Ortho Exam ? ?Patient is alert, oriented, no adenopathy, well-dressed, normal affect, normal respiratory effort. ?Examination patient has antalgic gait with hyperextension and valgus alignment to the left knee with slight varus alignment to the right knee.  Patient is globally tender to palpation around her knees she has laxity of the collateral ligaments medially on the left.  There is crepitation with range of motion. ? ?Imaging: ?XR Knee 1-2 Views Left ? ?Result Date: 07/24/2021 ?2 view radiographs of the left knee shows advanced valgus collapse with bone-on-bone contact lateral joint line insufficiency of the MCL medially with bony spurs in all 3 compartments with advanced arthritic changes. ? ?XR Knee 1-2 Views Right ? ?Result Date: 07/24/2021 ?2 view radiographs of the right knee shows advanced tricompartmental arthritic collapse with bone-on-bone contact medially with  varus alignment calcification of the meniscus and osteophytic bone spurs in all 3 compartments.  ?No images are attached to the encounter. ? ?Labs: ?Lab Results  ?Component Value Date  ? HGBA1C 8.6 (H) 07/22/2019  ? HGBA1C 8.9 (H) 07/27/2017  ? HGBA1C 7.6 (H) 05/22/2016  ? ESRSEDRATE 36 (H) 11/20/2019  ? ESRSEDRATE 60 (H) 07/28/2019  ? ESRSEDRATE 22 07/10/2019  ? CRP 10.9 (H) 11/20/2019  ? CRP 10.1 (H) 07/28/2019  ? CRP 3.8 07/10/2019  ? LABURIC 5.5 11/29/2014  ? LABURIC 4.6 10/01/2014  ? LABURIC 5.9 06/09/2013  ? REPTSTATUS 07/30/2019 FINAL 07/26/2019  ? GRAMSTAIN  07/26/2019  ?  ABUNDANT WBC PRESENT,BOTH PMN AND MONONUCLEAR ?ABUNDANT GRAM POSITIVE COCCI ?FEW GRAM NEGATIVE RODS ?  ? CULT  07/26/2019  ?  ABUNDANT GROUP B STREP(S.AGALACTIAE)ISOLATED ?TESTING AGAINST S. AGALACTIAE NOT ROUTINELY PERFORMED DUE TO PREDICTABILITY OF AMP/PEN/VAN SUSCEPTIBILITY. ?FEW STAPHYLOCOCCUS HAEMOLYTICUS ?ABUNDANT BACTEROIDES FRAGILIS ?BETA LACTAMASE POSITIVE ?Performed at [facility], [place] [address] [place], [place] [address] ?  ? [place] STAPHYLOCOCCUS HAEMOLYTICUS 07/26/2019  ? ? ? ?Lab Results  ?Component Value Date  ? ALBUMIN 3.2 (L) 07/22/2019  ? ALBUMIN 2.4 (L) 08/02/2017  ? ALBUMIN 3.1 (L) 07/28/2017  ? ? ?Lab Results  ?Component Value Date  ? MG 1.2 (L) 08/02/2017  ? MG 1.6 (L) 07/29/2017  ? MG 1.3 (L) 07/27/2017  ? ?Lab Results  ?Component Value Date  ? VD25OH 28.7 (L) 11/28/2012  ? ? ?  No results found for: PREALBUMIN ? ?  Latest Ref Rng & Units 11/20/2019  ? 10:27 AM 07/31/2019  ?  4:18 AM 07/30/2019  ?  4:26 AM  ?CBC EXTENDED  ?WBC 3.8 - 10.8 Thousand/uL 5.5   6.9   8.0    ?RBC 3.80 - 5.10 Million/uL 3.53   3.59   3.58    ?Hemoglobin 11.7 - 15.5 g/dL 10.7   10.6   10.3    ?HCT 35.0 - 45.0 % 32.6   33.1   33.0    ?Platelets 140 - 400 Thousand/uL 282   298   296    ?NEUT# 1,500 - 7,800 cells/uL 2,602      ?Lymph# 850 - 3,900 cells/uL 1,771      ? ? ? ?There is no height or weight on file to calculate BMI. ? ?Orders:   ?Orders Placed This Encounter  ?Procedures  ? XR Knee 1-2 Views Right  ? XR Knee 1-2 Views Left  ? ?No orders of the defined types were placed in this encounter. ? ? ? Procedures: ?Large Joint Inj: bilateral knee on 07/24/2021 5:33 PM ?Indications: pain and diagnostic evaluation ?Details: 22 G 1.5 in needle, anteromedial approach ? ?Arthrogram: No ? ?Outcome: tolerated well, no immediate complications ?Procedure, treatment alternatives, risks and benefits explained, specific risks discussed. Consent was given by the patient. Immediately prior to procedure a time out was called to verify the correct patient, procedure, equipment, support staff and site/side marked as required. Patient was prepped and draped in the usual sterile fashion.  ? ? ? ?Clinical Data: ?No additional findings. ? ?ROS: ? ?All other systems negative, except as noted in the HPI. ?Review of Systems ? ?Objective: ?Vital Signs: LMP 07/02/1973  ? ?Specialty Comments:  ?No specialty comments available. ? ?PMFS History: ?Patient Active Problem List  ? Diagnosis Date Noted  ? Cutaneous abscess of left foot   ? Osteomyelitis ([place]) 07/22/2019  ? Diabetic foot infection ([place]) 07/10/2019  ? Acute osteomyelitis of left calcaneus ([place]) 07/10/2019  ? Group B streptococcal infection 07/10/2019  ? Bacterial infection due to Morganella morganii 07/10/2019  ? Pseudogout of right knee 08/02/2017  ? SBO (small bowel obstruction) ([place]) 07/22/2017  ? Abnormal LFTs 03/20/2016  ? Abdominal pain, left lower quadrant 07/04/2015  ? Type 2 diabetes mellitus with diabetic polyneuropathy ([place]) 10/06/2013  ? Hyperlipidemia associated with type 2 diabetes mellitus ([place]) 10/06/2013  ? Fungal dermatitis 08/11/2013  ? Hyperlipidemia 06/09/2013  ? Choledocholithiasis with acute cholecystitis 04/14/2013  ? Symptomatic cholelithiasis 04/13/2013  ? Neuropathic pain of both legs 04/04/2013  ? Severe obesity (BMI >= 40) ([place]) 11/28/2012  ? Obesity (BMI 30-39.9) 11/28/2012  ? Diabetes  mellitus with neurological manifestation ([place])   ? Hypertension goal BP (blood pressure) < 130/80   ? Osteopenia   ? Chronic venous insufficiency   ? Leg edema   ? Hypertensive retinopathy, grade 2 03/30/2012  ? Medial epicondylitis 02/11/2012  ? Superficial thrombophlebitis of left leg 07/15/2011  ? GERD (gastroesophageal reflux disease) 02/25/2011  ? Right knee pain 02/16/2011  ? Preventive measure 09/11/2010  ? Neck pain 07/03/2010  ? Grieving 04/17/2010  ? OSTEOPENIA 04/12/2006  ? Controlled type 2 diabetes mellitus with complication, with long-term current use of insulin ([place]) 03/08/2006  ? MORBID OBESITY 03/08/2006  ? Essential hypertension 03/08/2006  ? ?Past Medical History:  ?Diagnosis Date  ? Acute osteomyelitis of calcaneum, left ([place]) 07/10/2019  ? Cellulitis of left leg   ? history of, most  recent episode 01/09  ? Chronic venous insufficiency   ? Diabetes mellitus with neurological manifestation ([place])   ? Diabetic foot infection ([place]) 07/10/2019  ? Diverticulitis   ? h/o 2-3 episodes in past  ? Group B streptococcal infection 07/10/2019  ? Hyperlipidemia LDL goal < 100   ? Hypertension goal BP (blood pressure) < 130/80   ? Leg edema   ? secondary to chronic venous insufficiency  ? Morbid obesity ([place])   ? Osteopenia   ? DEXA scan 7/07  ? Pseudomonas aeruginosa infection 07/10/2019  ?  ?Family History  ?Problem Relation Age of Onset  ? Heart disease Mother   ? Heart disease Father   ? Heart disease Brother   ? Cancer Brother   ? Cancer Brother   ? Hypertension Sister   ? Heart disease Sister   ?  ?Past Surgical History:  ?Procedure Laterality Date  ? ABDOMINAL HYSTERECTOMY    ? APPENDECTOMY    ? CHOLECYSTECTOMY    ? CHOLECYSTECTOMY N/A 04/14/2013  ? Procedure: LAPAROSCOPIC CHOLECYSTECTOMY WITH INTRAOPERATIVE CHOLANGIOGRAM;  Surgeon: Adin Hector, MD;  Location: [facility];  Service: General;  Laterality: N/A;  ? COLECTOMY    ? with diverting colsotmy and revision in the 1990's for diverticulitis  ? ERCP N/A  04/17/2013  ? Procedure: ENDOSCOPIC RETROGRADE CHOLANGIOPANCREATOGRAPHY (ERCP);  Surgeon: Irene Shipper, MD;  Location: [facility];  Service: Endoscopy;  Laterality: N/A;  note pt want general anesthesia fo

## 2021-12-09 ENCOUNTER — Ambulatory Visit (INDEPENDENT_AMBULATORY_CARE_PROVIDER_SITE_OTHER): Payer: Medicare Other | Admitting: Family

## 2021-12-09 ENCOUNTER — Encounter: Payer: Self-pay | Admitting: Family

## 2021-12-09 DIAGNOSIS — M170 Bilateral primary osteoarthritis of knee: Secondary | ICD-10-CM

## 2021-12-09 DIAGNOSIS — M25562 Pain in left knee: Secondary | ICD-10-CM | POA: Diagnosis not present

## 2021-12-09 DIAGNOSIS — M25561 Pain in right knee: Secondary | ICD-10-CM | POA: Diagnosis not present

## 2021-12-09 DIAGNOSIS — G8929 Other chronic pain: Secondary | ICD-10-CM | POA: Diagnosis not present

## 2021-12-09 MED ORDER — LIDOCAINE HCL 1 % IJ SOLN
5.0000 mL | INTRAMUSCULAR | Status: AC | PRN
  Administered 2021-12-09: 5 mL

## 2021-12-09 MED ORDER — METHYLPREDNISOLONE ACETATE 40 MG/ML IJ SUSP
40.0000 mg | INTRAMUSCULAR | Status: AC | PRN
  Administered 2021-12-09: 40 mg via INTRA_ARTICULAR

## 2021-12-09 NOTE — Progress Notes (Signed)
Office Visit Note   Patient: Debra [name]           Date of Birth: 11/24/1945           MRN: [id number] Visit Date: 12/09/2021              Requested by: Vicenta Aly, [place],  [place] [address] PCP: Vicenta Aly, FNP  Chief Complaint  Patient presents with   Right Knee - Follow-up   Left Knee - Follow-up      HPI: The patient is a 76 year old woman who is seen today for evaluation of bilateral knee pain has a history of osteoarthritis her last cortisone injection was in May of this year this has provided her with great relief until the last few weeks  Pain has been gradually worsening right is worse than left she does ambulate with a straight cane.  She has had physical therapy in the past without relief  Assessment & Plan: Visit Diagnoses: No diagnosis found.  Plan: Depo-Medrol injection bilateral knees.  Patient tolerated well.  Follow-Up Instructions: Return if symptoms worsen or fail to improve.   Ortho Exam  Patient is alert, oriented, no adenopathy, well-dressed, normal affect, normal respiratory effort. On examination patient has antalgic gait with hyperextension and valgus alignment of left knee with slight varus of right knee. Global tenderness. Crepitation with ROM  Imaging: No results found. No images are attached to the encounter.  Labs: Lab Results  Component Value Date   HGBA1C 8.6 (H) 07/22/2019   HGBA1C 8.9 (H) 07/27/2017   HGBA1C 7.6 (H) 05/22/2016   ESRSEDRATE 36 (H) 11/20/2019   ESRSEDRATE 60 (H) 07/28/2019   ESRSEDRATE 22 07/10/2019   CRP 10.9 (H) 11/20/2019   CRP 10.1 (H) 07/28/2019   CRP 3.8 07/10/2019   LABURIC 5.5 11/29/2014   LABURIC 4.6 10/01/2014   LABURIC 5.9 06/09/2013   REPTSTATUS 07/30/2019 FINAL 07/26/2019   GRAMSTAIN  07/26/2019    ABUNDANT WBC PRESENT,BOTH PMN AND MONONUCLEAR ABUNDANT GRAM POSITIVE COCCI FEW GRAM NEGATIVE RODS    CULT  07/26/2019    ABUNDANT GROUP B  STREP(S.AGALACTIAE)ISOLATED TESTING AGAINST S. AGALACTIAE NOT ROUTINELY PERFORMED DUE TO PREDICTABILITY OF AMP/PEN/VAN SUSCEPTIBILITY. FEW STAPHYLOCOCCUS HAEMOLYTICUS ABUNDANT BACTEROIDES FRAGILIS BETA LACTAMASE POSITIVE Performed at [facility], [place] [address] [place], [place] [address]    [place] STAPHYLOCOCCUS HAEMOLYTICUS 07/26/2019     Lab Results  Component Value Date   ALBUMIN 3.2 (L) 07/22/2019   ALBUMIN 2.4 (L) 08/02/2017   ALBUMIN 3.1 (L) 07/28/2017    Lab Results  Component Value Date   MG 1.2 (L) 08/02/2017   MG 1.6 (L) 07/29/2017   MG 1.3 (L) 07/27/2017   Lab Results  Component Value Date   VD25OH 28.7 (L) 11/28/2012    No results found for: "PREALBUMIN"    Latest Ref Rng & Units 11/20/2019   10:27 AM 07/31/2019    4:18 AM 07/30/2019    4:26 AM  CBC EXTENDED  WBC 3.8 - 10.8 Thousand/uL 5.5  6.9  8.0   RBC 3.80 - 5.10 Million/uL 3.53  3.59  3.58   Hemoglobin 11.7 - 15.5 g/dL 10.7  10.6  10.3   HCT 35.0 - 45.0 % 32.6  33.1  33.0   Platelets 140 - 400 Thousand/uL 282  298  296   NEUT# 1,500 - 7,800 cells/uL 2,602     Lymph# 850 - 3,900 cells/uL 1,771  There is no height or weight on file to calculate BMI.  Orders:  No orders of the defined types were placed in this encounter.  No orders of the defined types were placed in this encounter.    Procedures: Large Joint Inj: bilateral knee on 12/09/2021 10:39 AM Indications: pain Details: 18 G 1.5 in needle, anteromedial approach Medications (Right): 5 mL lidocaine 1 %; 40 mg methylPREDNISolone acetate 40 MG/ML Medications (Left): 5 mL lidocaine 1 %; 40 mg methylPREDNISolone acetate 40 MG/ML Consent was given by the patient.      Clinical Data: No additional findings.  ROS:  All other systems negative, except as noted in the HPI. Review of Systems  Objective: Vital Signs: LMP 07/02/1973   Specialty Comments:  No specialty comments available.  PMFS History: Patient Active  Problem List   Diagnosis Date Noted   Cutaneous abscess of left foot    Osteomyelitis ([place]) 07/22/2019   Diabetic foot infection ([place]) 07/10/2019   Acute osteomyelitis of left calcaneus ([place]) 07/10/2019   Group B streptococcal infection 07/10/2019   Bacterial infection due to Morganella morganii 07/10/2019   Pseudogout of right knee 08/02/2017   SBO (small bowel obstruction) ([place]) 07/22/2017   Abnormal LFTs 03/20/2016   Abdominal pain, left lower quadrant 07/04/2015   Type 2 diabetes mellitus with diabetic polyneuropathy ([place]) 10/06/2013   Hyperlipidemia associated with type 2 diabetes mellitus ([place]) 10/06/2013   Fungal dermatitis 08/11/2013   Hyperlipidemia 06/09/2013   Choledocholithiasis with acute cholecystitis 04/14/2013   Symptomatic cholelithiasis 04/13/2013   Neuropathic pain of both legs 04/04/2013   Severe obesity (BMI >= 40) ([facility]) 11/28/2012   Obesity (BMI 30-39.9) 11/28/2012   Diabetes mellitus with neurological manifestation ([facility])    Hypertension goal BP (blood pressure) < 130/80    Osteopenia    Chronic venous insufficiency    Leg edema    Hypertensive retinopathy, grade 2 03/30/2012   Medial epicondylitis 02/11/2012   Superficial thrombophlebitis of left leg 07/15/2011   GERD (gastroesophageal reflux disease) 02/25/2011   Right knee pain 02/16/2011   Preventive measure 09/11/2010   Neck pain 07/03/2010   Grieving 04/17/2010   OSTEOPENIA 04/12/2006   Controlled type 2 diabetes mellitus with complication, with long-term current use of insulin ([place]) 03/08/2006   MORBID OBESITY 03/08/2006   Essential hypertension 03/08/2006   Past Medical History:  Diagnosis Date   Acute osteomyelitis of calcaneum, left ([place]) 07/10/2019   Cellulitis of left leg    history of, most recent episode 01/09   Chronic venous insufficiency    Diabetes mellitus with neurological manifestation ([facility])    Diabetic foot infection ([place]) 07/10/2019   Diverticulitis    h/o 2-3 episodes in past    Group B streptococcal infection 07/10/2019   Hyperlipidemia LDL goal < 100    Hypertension goal BP (blood pressure) < 130/80    Leg edema    secondary to chronic venous insufficiency   Morbid obesity ([place])    Osteopenia    DEXA scan 7/07   Pseudomonas aeruginosa infection 07/10/2019    Family History  Problem Relation Age of Onset   Heart disease Mother    Heart disease Father    Heart disease Brother    Cancer Brother    Cancer Brother    Hypertension Sister    Heart disease Sister     Past Surgical History:  Procedure Laterality Date   ABDOMINAL HYSTERECTOMY     APPENDECTOMY     CHOLECYSTECTOMY     CHOLECYSTECTOMY  N/A 04/14/2013   Procedure: LAPAROSCOPIC CHOLECYSTECTOMY WITH INTRAOPERATIVE CHOLANGIOGRAM;  Surgeon: Adin Hector, MD;  [facility];  Service: General;  Laterality: N/A;   COLECTOMY     with diverting colsotmy and revision in the 1990's for diverticulitis   ERCP N/A 04/17/2013   Procedure: ENDOSCOPIC RETROGRADE CHOLANGIOPANCREATOGRAPHY (ERCP);  Surgeon: Irene Shipper, MD;  Location: [facility];  Service: Endoscopy;  Laterality: N/A;  note pt want general anesthesia for this case.  preferto perform in endo unit, but if unavailable please arrange time in [place]  8/13   left cataract removal & retinal repair   I & D EXTREMITY Left 07/26/2019   Procedure: LEFT PARTIAL CALCANEOUS EXCISION;  Surgeon: [name], MD;  Location: [facility];  Service: Orthopedics;  Laterality: Left;   INSERTION OF MESH N/A 07/30/2017   Procedure: INSERTION OF MESH;  Surgeon: Jovita Kussmaul, MD;  [facility];  Service: General;  Laterality: N/A;   LAPAROSCOPIC LYSIS OF ADHESIONS N/A 04/14/2013   Procedure: LAPAROSCOPIC LYSIS OF ADHESIONS;  Surgeon: Adin Hector, MD;  [facility];  Service: General;  Laterality: N/A;   LAPAROTOMY N/A 07/30/2017   Procedure: EXPLORATORY LAPAROTOMY;  Surgeon: Jovita Kussmaul, MD;  [facility];  Service: General;  Laterality: N/A;    LYSIS OF ADHESION N/A 07/30/2017   Procedure: EXTENSIVE LYSIS OF ADHESION;  Surgeon: Jovita Kussmaul, MD;  [facility];  Service: General;  Laterality: N/A;   OMENTECTOMY N/A 07/30/2017   Procedure: PARTIAL OMENTECTOMY;  Surgeon: Jovita Kussmaul, MD;  [facility];  Service: General;  Laterality: N/A;   VENTRAL HERNIA REPAIR N/A 07/30/2017   Procedure: HERNIA REPAIR VENTRAL ADULT;  Surgeon: Jovita Kussmaul, MD;  [facility];  Service: General;  Laterality: N/A;   Social History   Occupational History   Not on file  Tobacco Use   Smoking status: Never   Smokeless tobacco: Never  Vaping Use   Vaping Use: Never used  Substance and Sexual Activity   Alcohol use: No   Drug use: No   Sexual activity: Yes

## 2022-02-25 ENCOUNTER — Encounter: Payer: Self-pay | Admitting: Family

## 2022-02-25 ENCOUNTER — Ambulatory Visit (INDEPENDENT_AMBULATORY_CARE_PROVIDER_SITE_OTHER): Payer: Medicare Other | Admitting: Family

## 2022-02-25 DIAGNOSIS — M1712 Unilateral primary osteoarthritis, left knee: Secondary | ICD-10-CM | POA: Diagnosis not present

## 2022-02-25 DIAGNOSIS — M1711 Unilateral primary osteoarthritis, right knee: Secondary | ICD-10-CM

## 2022-02-25 DIAGNOSIS — M170 Bilateral primary osteoarthritis of knee: Secondary | ICD-10-CM

## 2022-02-25 MED ORDER — LIDOCAINE HCL 1 % IJ SOLN
5.0000 mL | INTRAMUSCULAR | Status: AC | PRN
  Administered 2022-02-25: 5 mL

## 2022-02-25 MED ORDER — METHYLPREDNISOLONE ACETATE 40 MG/ML IJ SUSP
40.0000 mg | INTRAMUSCULAR | Status: AC | PRN
  Administered 2022-02-25: 40 mg via INTRA_ARTICULAR

## 2022-02-25 NOTE — Progress Notes (Signed)
Office Visit Note   Patient: Debra [name]           Date of Birth: 1945/09/29           MRN: [id number] Visit Date: 02/25/2022              Requested by: Vicenta Aly, [place],  [place] [address] PCP: Vicenta Aly, FNP  Chief Complaint  Patient presents with   Right Knee - Follow-up    S/p cortisone injections 11/2021   Left Knee - Follow-up      HPI: The patient is a 76 year old woman seen today for ongoing bilateral knee pain associate with osteoarthritis.  Her last Depo-Medrol injection was about 3 months ago.  This did provide her with some interval relief this has been gradually worsening.  She states the right is more painful than the left.  She does use a straight cane for ambulation.  Requesting repeat injection today.  Assessment & Plan: Visit Diagnoses: No diagnosis found.  Plan: Depo-Medrol injection bilateral knees.  Patient tolerated well.  She will follow-up in the office in 3 months or longer depending on her return of pain.  Follow-Up Instructions: Return in about 3 months (around 05/27/2022), or if symptoms worsen or fail to improve.   Ortho Exam  Patient is alert, oriented, no adenopathy, well-dressed, normal affect, normal respiratory effort. On examination of bilateral lower extremities she does have an antalgic gait with hyperextension and varus alignment of the left knee.  She has slight varus alignment of the right knee.  She has global knee tenderness.  Does have crepitation with range of motion.  The collaterals and cruciates are stable.  Imaging: No results found. No images are attached to the encounter.  Labs: Lab Results  Component Value Date   HGBA1C 8.6 (H) 07/22/2019   HGBA1C 8.9 (H) 07/27/2017   HGBA1C 7.6 (H) 05/22/2016   ESRSEDRATE 36 (H) 11/20/2019   ESRSEDRATE 60 (H) 07/28/2019   ESRSEDRATE 22 07/10/2019   CRP 10.9 (H) 11/20/2019   CRP 10.1 (H) 07/28/2019   CRP 3.8 07/10/2019   LABURIC 5.5  11/29/2014   LABURIC 4.6 10/01/2014   LABURIC 5.9 06/09/2013   REPTSTATUS 07/30/2019 FINAL 07/26/2019   GRAMSTAIN  07/26/2019    ABUNDANT WBC PRESENT,BOTH PMN AND MONONUCLEAR ABUNDANT GRAM POSITIVE COCCI FEW GRAM NEGATIVE RODS    CULT  07/26/2019    ABUNDANT GROUP B STREP(S.AGALACTIAE)ISOLATED TESTING AGAINST S. AGALACTIAE NOT ROUTINELY PERFORMED DUE TO PREDICTABILITY OF AMP/PEN/VAN SUSCEPTIBILITY. FEW STAPHYLOCOCCUS HAEMOLYTICUS ABUNDANT BACTEROIDES FRAGILIS BETA LACTAMASE POSITIVE Performed at [facility], [place] [address], [place], [place] [address]    LABORGA STAPHYLOCOCCUS HAEMOLYTICUS 07/26/2019     Lab Results  Component Value Date   ALBUMIN 3.2 (L) 07/22/2019   ALBUMIN 2.4 (L) 08/02/2017   ALBUMIN 3.1 (L) 07/28/2017    Lab Results  Component Value Date   MG 1.2 (L) 08/02/2017   MG 1.6 (L) 07/29/2017   MG 1.3 (L) 07/27/2017   Lab Results  Component Value Date   VD25OH 28.7 (L) 11/28/2012    No results found for: "PREALBUMIN"    Latest Ref Rng & Units 11/20/2019   10:27 AM 07/31/2019    4:18 AM 07/30/2019    4:26 AM  CBC EXTENDED  WBC 3.8 - 10.8 Thousand/uL 5.5  6.9  8.0   RBC 3.80 - 5.10 Million/uL 3.53  3.59  3.58   Hemoglobin 11.7 - 15.5 g/dL 10.7  10.6  10.3   HCT 35.0 - 45.0 % 32.6  33.1  33.0   Platelets 140 - 400 Thousand/uL 282  298  296   NEUT# 1,500 - 7,800 cells/uL 2,602     Lymph# 850 - 3,900 cells/uL 1,771        There is no height or weight on file to calculate BMI.  Orders:  No orders of the defined types were placed in this encounter.  No orders of the defined types were placed in this encounter.    Procedures: Large Joint Inj: bilateral knee on 02/25/2022 3:24 PM Indications: pain Details: 18 G 1.5 in needle, anteromedial approach Medications (Right): 5 mL lidocaine 1 %; 40 mg methylPREDNISolone acetate 40 MG/ML Medications (Left): 5 mL lidocaine 1 %; 40 mg methylPREDNISolone acetate 40 MG/ML Consent was given by the  patient.      Clinical Data: No additional findings.  ROS:  All other systems negative, except as noted in the HPI. Review of Systems  Objective: Vital Signs: LMP 07/02/1973   Specialty Comments:  No specialty comments available.  PMFS History: Patient Active Problem List   Diagnosis Date Noted   Cutaneous abscess of left foot    Osteomyelitis ([place]) 07/22/2019   Diabetic foot infection ([place]) 07/10/2019   Acute osteomyelitis of left calcaneus ([place]) 07/10/2019   Group B streptococcal infection 07/10/2019   Bacterial infection due to Morganella morganii 07/10/2019   Pseudogout of right knee 08/02/2017   SBO (small bowel obstruction) ([place]) 07/22/2017   Abnormal LFTs 03/20/2016   Abdominal pain, left lower quadrant 07/04/2015   Type 2 diabetes mellitus with diabetic polyneuropathy ([place]) 10/06/2013   Hyperlipidemia associated with type 2 diabetes mellitus ([place]) 10/06/2013   Fungal dermatitis 08/11/2013   Hyperlipidemia 06/09/2013   Choledocholithiasis with acute cholecystitis 04/14/2013   Symptomatic cholelithiasis 04/13/2013   Neuropathic pain of both legs 04/04/2013   Severe obesity (BMI >= 40) (HCC) 11/28/2012   Obesity (BMI 30-39.9) 11/28/2012   Diabetes mellitus with neurological manifestation (HCC)    Hypertension goal BP (blood pressure) < 130/80    Osteopenia    Chronic venous insufficiency    Leg edema    Hypertensive retinopathy, grade 2 03/30/2012   Medial epicondylitis 02/11/2012   Superficial thrombophlebitis of left leg 07/15/2011   GERD (gastroesophageal reflux disease) 02/25/2011   Right knee pain 02/16/2011   Preventive measure 09/11/2010   Neck pain 07/03/2010   Grieving 04/17/2010   OSTEOPENIA 04/12/2006   Controlled type 2 diabetes mellitus with complication, with long-term current use of insulin ([place]) 03/08/2006   MORBID OBESITY 03/08/2006   Essential hypertension 03/08/2006   Past Medical History:  Diagnosis Date   Acute osteomyelitis of  calcaneum, left ([place]) 07/10/2019   Cellulitis of left leg    history of, most recent episode 01/09   Chronic venous insufficiency    Diabetes mellitus with neurological manifestation (HCC)    Diabetic foot infection ([place]) 07/10/2019   Diverticulitis    h/o 2-3 episodes in past   Group B streptococcal infection 07/10/2019   Hyperlipidemia LDL goal < 100    Hypertension goal BP (blood pressure) < 130/80    Leg edema    secondary to chronic venous insufficiency   Morbid obesity ([place])    Osteopenia    DEXA scan 7/07   Pseudomonas aeruginosa infection 07/10/2019    Family History  Problem Relation Age of Onset   Heart disease Mother    Heart disease Father    Heart disease  Brother    Cancer Brother    Cancer Brother    Hypertension Sister    Heart disease Sister     Past Surgical History:  Procedure Laterality Date   ABDOMINAL HYSTERECTOMY     APPENDECTOMY     CHOLECYSTECTOMY     CHOLECYSTECTOMY N/A 04/14/2013   Procedure: LAPAROSCOPIC CHOLECYSTECTOMY WITH INTRAOPERATIVE CHOLANGIOGRAM;  Surgeon: Adin Hector, MD;  Location: [facility];  Service: General;  Laterality: N/A;   COLECTOMY     with diverting colsotmy and revision in the 1990's for diverticulitis   ERCP N/A 04/17/2013   Procedure: ENDOSCOPIC RETROGRADE CHOLANGIOPANCREATOGRAPHY (ERCP);  Surgeon: Irene Shipper, MD;  Location: [facility];  Service: Endoscopy;  Laterality: N/A;  note pt want general anesthesia for this case.  preferto perform in endo unit, but if unavailable please arrange time in [place]  8/13   left cataract removal & retinal repair   I & D EXTREMITY Left 07/26/2019   Procedure: LEFT PARTIAL CALCANEOUS EXCISION;  Surgeon: [name], MD;  Location: [facility];  Service: Orthopedics;  Laterality: Left;   INSERTION OF MESH N/A 07/30/2017   Procedure: INSERTION OF MESH;  Surgeon: Jovita Kussmaul, MD;  [facility];  Service: General;  Laterality: N/A;   LAPAROSCOPIC LYSIS OF ADHESIONS N/A 04/14/2013    Procedure: LAPAROSCOPIC LYSIS OF ADHESIONS;  Surgeon: Adin Hector, MD;  [facility];  Service: General;  Laterality: N/A;   LAPAROTOMY N/A 07/30/2017   Procedure: EXPLORATORY LAPAROTOMY;  Surgeon: Jovita Kussmaul, MD;  [facility];  Service: General;  Laterality: N/A;   LYSIS OF ADHESION N/A 07/30/2017   Procedure: EXTENSIVE LYSIS OF ADHESION;  Surgeon: Jovita Kussmaul, MD;  [facility];  Service: General;  Laterality: N/A;   OMENTECTOMY N/A 07/30/2017   Procedure: PARTIAL OMENTECTOMY;  Surgeon: Jovita Kussmaul, MD;  [facility];  Service: General;  Laterality: N/A;   VENTRAL HERNIA REPAIR N/A 07/30/2017   Procedure: HERNIA REPAIR VENTRAL ADULT;  Surgeon: Jovita Kussmaul, MD;  [facility];  Service: General;  Laterality: N/A;   Social History   Occupational History   Not on file  Tobacco Use   Smoking status: Never   Smokeless tobacco: Never  Vaping Use   Vaping Use: Never used  Substance and Sexual Activity   Alcohol use: No   Drug use: No   Sexual activity: Yes

## 2022-03-02 ENCOUNTER — Telehealth: Payer: Self-pay | Admitting: Family

## 2022-03-02 NOTE — Telephone Encounter (Signed)
Patient states she is having sever knee pain and called this am and no one has callede her back. Please advise.[phone]. Sending call to triage nurse.Marland Kitchen

## 2022-03-02 NOTE — Telephone Encounter (Signed)
This pt had a cortisone injection right knee is good and left knee is painful

## 2022-03-02 NOTE — Telephone Encounter (Signed)
Pt called in stated that she received two injection in her leg on Dec 6th by PA Dondra Prader... Pt stated that the injection in the right leg is doing good... Pt stated that the left leg injection did not work... Pt stated that she is in a lot of pain in the left leg... Pt requesting callback.Marland KitchenMarland KitchenMarland Kitchen

## 2022-03-02 NOTE — Telephone Encounter (Signed)
I called pt and offered appt for tomorrow morning and this would not work with her schedule. Appt she for Wednesday at 10:30

## 2022-03-02 NOTE — Telephone Encounter (Signed)
Appt Wednesday

## 2022-03-04 ENCOUNTER — Encounter: Payer: Self-pay | Admitting: Family

## 2022-03-04 ENCOUNTER — Ambulatory Visit: Payer: Medicare Other | Admitting: Family

## 2022-03-04 DIAGNOSIS — M1712 Unilateral primary osteoarthritis, left knee: Secondary | ICD-10-CM

## 2022-03-04 NOTE — Progress Notes (Signed)
Office Visit Note   Patient: Debra [name]           Date of Birth: March 01, 1946           MRN: [id number] Visit Date: 03/04/2022              Requested by: Vicenta Aly, [place],  [place] [address] PCP: Vicenta Aly, FNP  No chief complaint on file.     HPI: The patient is a 76 year old woman who presents today complaining of worse left-sided knee pain.  She has a history of osteoarthritis bilaterally in the knees.  She had Depo-Medrol injections bilateral knees December 6 of this year she states she did have some relief of her right knee unfortunately since the injection she is complaining of worse pain in the left knee this is globally around the anterior knee.  No recent injuries no change in her mobility no redness no fever no chills  Assessment & Plan: Visit Diagnoses: No diagnosis found.  Plan: repeat depomedrol injection left knee.  Patient tolerated well.  Voiced relief.  Follow-up as needed  Follow-Up Instructions: Return if symptoms worsen or fail to improve.   Left Knee Exam   Muscle Strength  The patient has normal left knee strength.  Tenderness  The patient is experiencing tenderness in the patellar tendon (diffuse).  Other  Erythema: absent Swelling: mild Effusion: no effusion present  Comments:  Valgus alignment      Patient is alert, oriented, no adenopathy, well-dressed, normal affect, normal respiratory effort.   Imaging: No results found. No images are attached to the encounter.  Labs: Lab Results  Component Value Date   HGBA1C 8.6 (H) 07/22/2019   HGBA1C 8.9 (H) 07/27/2017   HGBA1C 7.6 (H) 05/22/2016   ESRSEDRATE 36 (H) 11/20/2019   ESRSEDRATE 60 (H) 07/28/2019   ESRSEDRATE 22 07/10/2019   CRP 10.9 (H) 11/20/2019   CRP 10.1 (H) 07/28/2019   CRP 3.8 07/10/2019   LABURIC 5.5 11/29/2014   LABURIC 4.6 10/01/2014   LABURIC 5.9 06/09/2013   REPTSTATUS 07/30/2019 FINAL 07/26/2019   GRAMSTAIN   07/26/2019    ABUNDANT WBC PRESENT,BOTH PMN AND MONONUCLEAR ABUNDANT GRAM POSITIVE COCCI FEW GRAM NEGATIVE RODS    CULT  07/26/2019    ABUNDANT GROUP B STREP(S.AGALACTIAE)ISOLATED TESTING AGAINST S. AGALACTIAE NOT ROUTINELY PERFORMED DUE TO PREDICTABILITY OF AMP/PEN/VAN SUSCEPTIBILITY. FEW STAPHYLOCOCCUS HAEMOLYTICUS ABUNDANT BACTEROIDES FRAGILIS BETA LACTAMASE POSITIVE Performed at [facility], [place] [address], [place], [place] [address]    LABORGA STAPHYLOCOCCUS HAEMOLYTICUS 07/26/2019     Lab Results  Component Value Date   ALBUMIN 3.2 (L) 07/22/2019   ALBUMIN 2.4 (L) 08/02/2017   ALBUMIN 3.1 (L) 07/28/2017    Lab Results  Component Value Date   MG 1.2 (L) 08/02/2017   MG 1.6 (L) 07/29/2017   MG 1.3 (L) 07/27/2017   Lab Results  Component Value Date   VD25OH 28.7 (L) 11/28/2012    No results found for: "PREALBUMIN"    Latest Ref Rng & Units 11/20/2019   10:27 AM 07/31/2019    4:18 AM 07/30/2019    4:26 AM  CBC EXTENDED  WBC 3.8 - 10.8 Thousand/uL 5.5  6.9  8.0   RBC 3.80 - 5.10 Million/uL 3.53  3.59  3.58   Hemoglobin 11.7 - 15.5 g/dL 10.7  10.6  10.3   HCT 35.0 - 45.0 % 32.6  33.1  33.0   Platelets 140 - 400 Thousand/uL 282  298  296   NEUT# 1,500 - 7,800 cells/uL 2,602     Lymph# 850 - 3,900 cells/uL 1,771        There is no height or weight on file to calculate BMI.  Orders:  No orders of the defined types were placed in this encounter.  No orders of the defined types were placed in this encounter.    Procedures: No procedures performed  Clinical Data: No additional findings.  ROS:  All other systems negative, except as noted in the HPI. Review of Systems  Constitutional: Negative.   Musculoskeletal:  Positive for arthralgias.    Objective: Vital Signs: LMP 07/02/1973   Specialty Comments:  No specialty comments available.  PMFS History: Patient Active Problem List   Diagnosis Date Noted   Cutaneous abscess of left foot     Osteomyelitis ([place]) 07/22/2019   Diabetic foot infection ([place]) 07/10/2019   Acute osteomyelitis of left calcaneus ([place]) 07/10/2019   Group B streptococcal infection 07/10/2019   Bacterial infection due to Morganella morganii 07/10/2019   Pseudogout of right knee 08/02/2017   SBO (small bowel obstruction) ([place]) 07/22/2017   Abnormal LFTs 03/20/2016   Abdominal pain, left lower quadrant 07/04/2015   Type 2 diabetes mellitus with diabetic polyneuropathy ([place]) 10/06/2013   Hyperlipidemia associated with type 2 diabetes mellitus ([place]) 10/06/2013   Fungal dermatitis 08/11/2013   Hyperlipidemia 06/09/2013   Choledocholithiasis with acute cholecystitis 04/14/2013   Symptomatic cholelithiasis 04/13/2013   Neuropathic pain of both legs 04/04/2013   Severe obesity (BMI >= 40) (HCC) 11/28/2012   Obesity (BMI 30-39.9) 11/28/2012   Diabetes mellitus with neurological manifestation (HCC)    Hypertension goal BP (blood pressure) < 130/80    Osteopenia    Chronic venous insufficiency    Leg edema    Hypertensive retinopathy, grade 2 03/30/2012   Medial epicondylitis 02/11/2012   Superficial thrombophlebitis of left leg 07/15/2011   GERD (gastroesophageal reflux disease) 02/25/2011   Right knee pain 02/16/2011   Preventive measure 09/11/2010   Neck pain 07/03/2010   Grieving 04/17/2010   OSTEOPENIA 04/12/2006   Controlled type 2 diabetes mellitus with complication, with long-term current use of insulin ([place]) 03/08/2006   MORBID OBESITY 03/08/2006   Essential hypertension 03/08/2006   Past Medical History:  Diagnosis Date   Acute osteomyelitis of calcaneum, left ([place]) 07/10/2019   Cellulitis of left leg    history of, most recent episode 01/09   Chronic venous insufficiency    Diabetes mellitus with neurological manifestation (HCC)    Diabetic foot infection ([place]) 07/10/2019   Diverticulitis    h/o 2-3 episodes in past   Group B streptococcal infection 07/10/2019   Hyperlipidemia LDL goal <  100    Hypertension goal BP (blood pressure) < 130/80    Leg edema    secondary to chronic venous insufficiency   Morbid obesity ([place])    Osteopenia    DEXA scan 7/07   Pseudomonas aeruginosa infection 07/10/2019    Family History  Problem Relation Age of Onset   Heart disease Mother    Heart disease Father    Heart disease Brother    Cancer Brother    Cancer Brother    Hypertension Sister    Heart disease Sister     Past Surgical History:  Procedure Laterality Date   ABDOMINAL HYSTERECTOMY     APPENDECTOMY     CHOLECYSTECTOMY     CHOLECYSTECTOMY N/A 04/14/2013   Procedure: LAPAROSCOPIC CHOLECYSTECTOMY WITH INTRAOPERATIVE CHOLANGIOGRAM;  Surgeon:  Adin Hector, MD;  [facility];  Service: General;  Laterality: N/A;   COLECTOMY     with diverting colsotmy and revision in the 1990's for diverticulitis   ERCP N/A 04/17/2013   Procedure: ENDOSCOPIC RETROGRADE CHOLANGIOPANCREATOGRAPHY (ERCP);  Surgeon: Irene Shipper, MD;  Location[facility];  Service: Endoscopy;  Laterality: N/A;  note pt want general anesthesia for this case.  preferto perform in endo unit, but if unavailable please arrange time in [place]  8/13   left cataract removal & retinal repair   I & D EXTREMITY Left 07/26/2019   Procedure: LEFT PARTIAL CALCANEOUS EXCISION;  Surgeon: [name], MD;  Location: [facility];  Service: Orthopedics;  Laterality: Left;   INSERTION OF MESH N/A 07/30/2017   Procedure: INSERTION OF MESH;  Surgeon: Jovita Kussmaul, MD;  [facility];  Service: General;  Laterality: N/A;   LAPAROSCOPIC LYSIS OF ADHESIONS N/A 04/14/2013   Procedure: LAPAROSCOPIC LYSIS OF ADHESIONS;  Surgeon: Adin Hector, MD;  [facility];  Service: General;  Laterality: N/A;   LAPAROTOMY N/A 07/30/2017   Procedure: EXPLORATORY LAPAROTOMY;  Surgeon: Jovita Kussmaul, MD;  [facility];  Service: General;  Laterality: N/A;   LYSIS OF ADHESION N/A 07/30/2017   Procedure: EXTENSIVE LYSIS OF  ADHESION;  Surgeon: Jovita Kussmaul, MD;  [facility];  Service: General;  Laterality: N/A;   OMENTECTOMY N/A 07/30/2017   Procedure: PARTIAL OMENTECTOMY;  Surgeon: Jovita Kussmaul, MD;  [facility];  Service: General;  Laterality: N/A;   VENTRAL HERNIA REPAIR N/A 07/30/2017   Procedure: HERNIA REPAIR VENTRAL ADULT;  Surgeon: Jovita Kussmaul, MD;  [facility];  Service: General;  Laterality: N/A;   Social History   Occupational History   Not on file  Tobacco Use   Smoking status: Never   Smokeless tobacco: Never  Vaping Use   Vaping Use: Never used  Substance and Sexual Activity   Alcohol use: No   Drug use: No   Sexual activity: Yes

## 2022-05-12 ENCOUNTER — Encounter: Payer: Self-pay | Admitting: Family

## 2022-05-12 ENCOUNTER — Ambulatory Visit (INDEPENDENT_AMBULATORY_CARE_PROVIDER_SITE_OTHER): Payer: Medicare Other | Admitting: Family

## 2022-05-12 DIAGNOSIS — M1712 Unilateral primary osteoarthritis, left knee: Secondary | ICD-10-CM

## 2022-05-12 DIAGNOSIS — B351 Tinea unguium: Secondary | ICD-10-CM | POA: Diagnosis not present

## 2022-05-13 DIAGNOSIS — M1712 Unilateral primary osteoarthritis, left knee: Secondary | ICD-10-CM

## 2022-05-13 MED ORDER — METHYLPREDNISOLONE ACETATE 40 MG/ML IJ SUSP
40.0000 mg | INTRAMUSCULAR | Status: AC | PRN
  Administered 2022-05-13: 40 mg via INTRA_ARTICULAR

## 2022-05-13 MED ORDER — LIDOCAINE HCL 1 % IJ SOLN
5.0000 mL | INTRAMUSCULAR | Status: AC | PRN
  Administered 2022-05-13: 5 mL

## 2022-05-13 NOTE — Progress Notes (Signed)
Office Visit Note   Patient: Debra [name]           Date of Birth: [birth year]           MRN: [id number] Visit Date: 05/12/2022              Requested by: Vicenta Aly, [place],  [place] [address] PCP: Vicenta Aly, FNP  Chief Complaint  Patient presents with   Right Foot - Nail Problem   Left Foot - Nail Problem      HPI: The patient is a 77 year old woman who presents today complaining of return of her osteoarthritic pain to the left knee difficulty with her ADLs due to pain difficulty walking grinding.  She has had good relief with Depo-Medrol injections in the past and is requesting repeat injection.  She is also complaining of thickened and discolored nails she is unable to safely trim these she feels it has been way too long think she has not had these trim since last summer foot evaluation today  Assessment & Plan: Visit Diagnoses: No diagnosis found.  Plan: Nails trimmed x 5-10.  Patient tolerated well.  Injection Depo-Medrol left knee.  Patient tolerated well.  She is ambulating with a straight cane she will follow-up as needed  Follow-Up Instructions: Return in about 3 months (around 08/10/2022).   Left Knee Exam   Muscle Strength  The patient has normal left knee strength.  Tenderness  The patient is experiencing tenderness in the medial joint line, lateral retinaculum and lateral joint line.  Tests  Varus: negative Valgus: negative  Other  Erythema: absent Effusion: no effusion present      Patient is alert, oriented, no adenopathy, well-dressed, normal affect, normal respiratory effort. On examination bilateral lower extremities she does have thickened and discolored onychomycotic nails x 10 these were trimmed today she is unable to safely trim her own nails due to insensate neuropathy.  Imaging: No results found. No images are attached to the encounter.  Labs: Lab Results  Component Value Date   HGBA1C  8.6 (H) 07/22/2019   HGBA1C 8.9 (H) 07/27/2017   HGBA1C 7.6 (H) 05/22/2016   ESRSEDRATE 36 (H) 11/20/2019   ESRSEDRATE 60 (H) 07/28/2019   ESRSEDRATE 22 07/10/2019   CRP 10.9 (H) 11/20/2019   CRP 10.1 (H) 07/28/2019   CRP 3.8 07/10/2019   LABURIC 5.5 11/29/2014   LABURIC 4.6 10/01/2014   LABURIC 5.9 06/09/2013   REPTSTATUS 07/30/2019 FINAL 07/26/2019   GRAMSTAIN  07/26/2019    ABUNDANT WBC PRESENT,BOTH PMN AND MONONUCLEAR ABUNDANT GRAM POSITIVE COCCI FEW GRAM NEGATIVE RODS    CULT  07/26/2019    ABUNDANT GROUP B STREP(S.AGALACTIAE)ISOLATED TESTING AGAINST S. AGALACTIAE NOT ROUTINELY PERFORMED DUE TO PREDICTABILITY OF AMP/PEN/VAN SUSCEPTIBILITY. FEW STAPHYLOCOCCUS HAEMOLYTICUS ABUNDANT BACTEROIDES FRAGILIS BETA LACTAMASE POSITIVE Performed at [facility], [place] [address] [place], [place] [address]    LABORGA STAPHYLOCOCCUS HAEMOLYTICUS 07/26/2019     Lab Results  Component Value Date   ALBUMIN 3.2 (L) 07/22/2019   ALBUMIN 2.4 (L) 08/02/2017   ALBUMIN 3.1 (L) 07/28/2017    Lab Results  Component Value Date   MG 1.2 (L) 08/02/2017   MG 1.6 (L) 07/29/2017   MG 1.3 (L) 07/27/2017   Lab Results  Component Value Date   VD25OH 28.7 (L) 11/28/2012    No results found for: "PREALBUMIN"    Latest Ref Rng & Units 11/20/2019   10:27 AM 07/31/2019  4:18 AM 07/30/2019    4:26 AM  CBC EXTENDED  WBC 3.8 - 10.8 Thousand/uL 5.5  6.9  8.0   RBC 3.80 - 5.10 Million/uL 3.53  3.59  3.58   Hemoglobin 11.7 - 15.5 g/dL 10.7  10.6  10.3   HCT 35.0 - 45.0 % 32.6  33.1  33.0   Platelets 140 - 400 Thousand/uL 282  298  296   NEUT# 1,500 - 7,800 cells/uL 2,602     Lymph# 850 - 3,900 cells/uL 1,771        There is no height or weight on file to calculate BMI.  Orders:  No orders of the defined types were placed in this encounter.  No orders of the defined types were placed in this encounter.    Procedures: Large Joint Inj: L knee on 05/13/2022 9:53 AM Indications:  pain Details: 18 G 1.5 in needle, anteromedial approach Medications: 5 mL lidocaine 1 %; 40 mg methylPREDNISolone acetate 40 MG/ML Consent was given by the patient.      Clinical Data: No additional findings.  ROS:  All other systems negative, except as noted in the HPI. Review of Systems  Objective: Vital Signs: LMP 07/02/1973   Specialty Comments:  No specialty comments available.  PMFS History: Patient Active Problem List   Diagnosis Date Noted   Cutaneous abscess of left foot    Osteomyelitis ([place]) 07/22/2019   Diabetic foot infection ([place]) 07/10/2019   Acute osteomyelitis of left calcaneus ([place]) 07/10/2019   Group B streptococcal infection 07/10/2019   Bacterial infection due to Morganella morganii 07/10/2019   Pseudogout of right knee 08/02/2017   SBO (small bowel obstruction) ([place]) 07/22/2017   Abnormal LFTs 03/20/2016   Abdominal pain, left lower quadrant 07/04/2015   Type 2 diabetes mellitus with diabetic polyneuropathy ([place]) 10/06/2013   Hyperlipidemia associated with type 2 diabetes mellitus ([place]) 10/06/2013   Fungal dermatitis 08/11/2013   Hyperlipidemia 06/09/2013   Choledocholithiasis with acute cholecystitis 04/14/2013   Symptomatic cholelithiasis 04/13/2013   Neuropathic pain of both legs 04/04/2013   Severe obesity (BMI >= 40) (HCC) 11/28/2012   Obesity (BMI 30-39.9) 11/28/2012   Diabetes mellitus with neurological manifestation (HCC)    Hypertension goal BP (blood pressure) < 130/80    Osteopenia    Chronic venous insufficiency    Leg edema    Hypertensive retinopathy, grade 2 03/30/2012   Medial epicondylitis 02/11/2012   Superficial thrombophlebitis of left leg 07/15/2011   GERD (gastroesophageal reflux disease) 02/25/2011   Right knee pain 02/16/2011   Preventive measure 09/11/2010   Neck pain 07/03/2010   Grieving 04/17/2010   OSTEOPENIA 04/12/2006   Controlled type 2 diabetes mellitus with complication, with long-term current use of  insulin ([place]) 03/08/2006   MORBID OBESITY 03/08/2006   Essential hypertension 03/08/2006   Past Medical History:  Diagnosis Date   Acute osteomyelitis of calcaneum, left ([place]) 07/10/2019   Cellulitis of left leg    history of, most recent episode 01/09   Chronic venous insufficiency    Diabetes mellitus with neurological manifestation (HCC)    Diabetic foot infection ([place]) 07/10/2019   Diverticulitis    h/o 2-3 episodes in past   Group B streptococcal infection 07/10/2019   Hyperlipidemia LDL goal < 100    Hypertension goal BP (blood pressure) < 130/80    Leg edema    secondary to chronic venous insufficiency   Morbid obesity ([place])    Osteopenia    DEXA scan 7/07   Pseudomonas aeruginosa  infection 07/10/2019    Family History  Problem Relation Age of Onset   Heart disease Mother    Heart disease Father    Heart disease Brother    Cancer Brother    Cancer Brother    Hypertension Sister    Heart disease Sister     Past Surgical History:  Procedure Laterality Date   ABDOMINAL HYSTERECTOMY     APPENDECTOMY     CHOLECYSTECTOMY     CHOLECYSTECTOMY N/A 04/14/2013   Procedure: LAPAROSCOPIC CHOLECYSTECTOMY WITH INTRAOPERATIVE CHOLANGIOGRAM;  Surgeon: Adin Hector, MD;  Location: [facility];  Service: General;  Laterality: N/A;   COLECTOMY     with diverting colsotmy and revision in the 1990's for diverticulitis   ERCP N/A 04/17/2013   Procedure: ENDOSCOPIC RETROGRADE CHOLANGIOPANCREATOGRAPHY (ERCP);  Surgeon: Irene Shipper, MD;  Location: [facility];  Service: Endoscopy;  Laterality: N/A;  note pt want general anesthesia for this case.  preferto perform in endo unit, but if unavailable please arrange time in [place]  8/13   left cataract removal & retinal repair   I & D EXTREMITY Left 07/26/2019   Procedure: LEFT PARTIAL CALCANEOUS EXCISION;  Surgeon: [name], MD;  Location: [facility];  Service: Orthopedics;  Laterality: Left;   INSERTION OF MESH N/A 07/30/2017    Procedure: INSERTION OF MESH;  Surgeon: Jovita Kussmaul, MD;  [facility]: [facility];  Service: General;  Laterality: N/A;   LAPAROSCOPIC LYSIS OF ADHESIONS N/A 04/14/2013   Procedure: LAPAROSCOPIC LYSIS OF ADHESIONS;  Surgeon: Adin Hector, MD;  [facility];  Service: General;  Laterality: N/A;   LAPAROTOMY N/A 07/30/2017   Procedure: EXPLORATORY LAPAROTOMY;  Surgeon: Jovita Kussmaul, MD;  [facility];  Service: General;  Laterality: N/A;   LYSIS OF ADHESION N/A 07/30/2017   Procedure: EXTENSIVE LYSIS OF ADHESION;  Surgeon: Jovita Kussmaul, MD;  [facility];  Service: General;  Laterality: N/A;   OMENTECTOMY N/A 07/30/2017   Procedure: PARTIAL OMENTECTOMY;  Surgeon: Jovita Kussmaul, MD;  [facility];  Service: General;  Laterality: N/A;   VENTRAL HERNIA REPAIR N/A 07/30/2017   Procedure: HERNIA REPAIR VENTRAL ADULT;  Surgeon: Jovita Kussmaul, MD;  [facility];  Service: General;  Laterality: N/A;   Social History   Occupational History   Not on file  Tobacco Use   Smoking status: Never   Smokeless tobacco: Never  Vaping Use   Vaping Use: Never used  Substance and Sexual Activity   Alcohol use: No   Drug use: No   Sexual activity: Yes

## 2022-06-12 ENCOUNTER — Other Ambulatory Visit (HOSPITAL_COMMUNITY): Payer: Self-pay

## 2022-06-13 ENCOUNTER — Other Ambulatory Visit (HOSPITAL_COMMUNITY): Payer: Self-pay

## 2022-06-13 MED ORDER — MOUNJARO 10 MG/0.5ML ~~LOC~~ SOAJ
10.0000 mg | SUBCUTANEOUS | 2 refills | Status: AC
  Filled 2022-06-13: qty 2, 28d supply, fill #0

## 2022-06-17 ENCOUNTER — Telehealth: Payer: Self-pay

## 2022-06-17 ENCOUNTER — Ambulatory Visit (INDEPENDENT_AMBULATORY_CARE_PROVIDER_SITE_OTHER): Payer: Medicare Other | Admitting: Family

## 2022-06-17 ENCOUNTER — Other Ambulatory Visit: Payer: Self-pay

## 2022-06-17 DIAGNOSIS — M25532 Pain in left wrist: Secondary | ICD-10-CM

## 2022-06-17 DIAGNOSIS — M1811 Unilateral primary osteoarthritis of first carpometacarpal joint, right hand: Secondary | ICD-10-CM

## 2022-06-17 NOTE — Telephone Encounter (Signed)
-----   Message from Suzan Slick, NP sent at 06/17/2022 10:57 AM EDT ----- Can we do gel for both knee please

## 2022-06-17 NOTE — Telephone Encounter (Signed)
Submitted for [facility] for Durolane-bilateral knee

## 2022-06-17 NOTE — Progress Notes (Signed)
Office Visit Note   Patient: Debra [name]           Date of Birth: [birth year]           MRN: [id number] Visit Date: 06/17/2022              Requested by: Vicenta Aly, [place],  [place] [id number] PCP: Vicenta Aly, FNP  Chief Complaint  Patient presents with   Left Knee - Follow-up    S/p cortisone injection 05/12/2022   Right Knee - Pain   Left Wrist - Pain      HPI: Debra [name] is a 77 year old woman who presents today complaining of bilateral knee pain.  She was hoping we could do supplemental injections bilateral knees today.  This has not yet been prior Auth.  She also is complaining of a separate issue of left wrist pain primarily at the base of her thumb this has been worsening over the last 10 to 14 days she has not had any associated injury she is left-hand dominant and carries a cane in her left hand wonders if using holding the cane has been aggravating her wrist pain.  Assessment & Plan: Visit Diagnoses:  1. Pain in left wrist     Plan: Depo-Medrol injection left thumb for osteoarthritis flare.  Will work on prior authorization for supplemental injection bilateral knees will call her for return visit once approved.  Follow-Up Instructions: No follow-ups on file.   Left Hand Exam   Tenderness  The patient is experiencing tenderness in the radial area.   Range of Motion  The patient has normal left wrist ROM.  Muscle Strength  Grip:  4/5   Other  Erythema: absent Sensation: normal  Comments:  Positive grind      Patient is alert, oriented, no adenopathy, well-dressed, normal affect, normal respiratory effort.   Imaging: No results found. No images are attached to the encounter.  Labs: Lab Results  Component Value Date   HGBA1C 8.6 (H) 07/22/2019   HGBA1C 8.9 (H) 07/27/2017   HGBA1C 7.6 (H) 05/22/2016   ESRSEDRATE 36 (H) 11/20/2019   ESRSEDRATE 60 (H) 07/28/2019   ESRSEDRATE 22 07/10/2019    CRP 10.9 (H) 11/20/2019   CRP 10.1 (H) 07/28/2019   CRP 3.8 07/10/2019   LABURIC 5.5 11/29/2014   LABURIC 4.6 10/01/2014   LABURIC 5.9 06/09/2013   REPTSTATUS 07/30/2019 FINAL 07/26/2019   GRAMSTAIN  07/26/2019    ABUNDANT WBC PRESENT,BOTH PMN AND MONONUCLEAR ABUNDANT GRAM POSITIVE COCCI FEW GRAM NEGATIVE RODS    CULT  07/26/2019    ABUNDANT GROUP B STREP(S.AGALACTIAE)ISOLATED TESTING AGAINST S. AGALACTIAE NOT ROUTINELY PERFORMED DUE TO PREDICTABILITY OF AMP/PEN/VAN SUSCEPTIBILITY. FEW STAPHYLOCOCCUS HAEMOLYTICUS ABUNDANT BACTEROIDES FRAGILIS BETA LACTAMASE POSITIVE Performed at [facility], [place] [address], [place], [place] [address]    LABORGA STAPHYLOCOCCUS HAEMOLYTICUS 07/26/2019     Lab Results  Component Value Date   ALBUMIN 3.2 (L) 07/22/2019   ALBUMIN 2.4 (L) 08/02/2017   ALBUMIN 3.1 (L) 07/28/2017    Lab Results  Component Value Date   MG 1.2 (L) 08/02/2017   MG 1.6 (L) 07/29/2017   MG 1.3 (L) 07/27/2017   Lab Results  Component Value Date   VD25OH 28.7 (L) 11/28/2012    No results found for: "PREALBUMIN"    Latest Ref Rng & Units 11/20/2019   10:27 AM 07/31/2019    4:18 AM 07/30/2019    4:26 AM  CBC EXTENDED  WBC 3.8 - 10.8 Thousand/uL 5.5  6.9  8.0   RBC 3.80 - 5.10 Million/uL 3.53  3.59  3.58   Hemoglobin 11.7 - 15.5 g/dL 10.7  10.6  10.3   HCT 35.0 - 45.0 % 32.6  33.1  33.0   Platelets 140 - 400 Thousand/uL 282  298  296   NEUT# 1,500 - 7,800 cells/uL 2,602     Lymph# 850 - 3,900 cells/uL 1,771        There is no height or weight on file to calculate BMI.  Orders:  Orders Placed This Encounter  Procedures   XR Wrist 2 Views Left   No orders of the defined types were placed in this encounter.    Procedures: Hand/UE Inj: R thumb [facility] for osteoarthritis on 06/17/2022 4:43 PM     Clinical Data: No additional findings.  ROS:  All other systems negative, except as noted in the HPI. Review of Systems  Objective: Vital Signs:  LMP 07/02/1973   Specialty Comments:  No specialty comments available.  PMFS History: Patient Active Problem List   Diagnosis Date Noted   Cutaneous abscess of left foot    Osteomyelitis ([place]) 07/22/2019   Diabetic foot infection ([place]) 07/10/2019   Acute osteomyelitis of left calcaneus ([place]) 07/10/2019   Group B streptococcal infection 07/10/2019   Bacterial infection due to Morganella morganii 07/10/2019   Pseudogout of right knee 08/02/2017   SBO (small bowel obstruction) ([place]) 07/22/2017   Abnormal LFTs 03/20/2016   Abdominal pain, left lower quadrant 07/04/2015   Type 2 diabetes mellitus with diabetic polyneuropathy ([place]) 10/06/2013   Hyperlipidemia associated with type 2 diabetes mellitus ([place]) 10/06/2013   Fungal dermatitis 08/11/2013   Hyperlipidemia 06/09/2013   Choledocholithiasis with acute cholecystitis 04/14/2013   Symptomatic cholelithiasis 04/13/2013   Neuropathic pain of both legs 04/04/2013   Severe obesity (BMI >= 40) (HCC) 11/28/2012   Obesity (BMI 30-39.9) 11/28/2012   Diabetes mellitus with neurological manifestation (HCC)    Hypertension goal BP (blood pressure) < 130/80    Osteopenia    Chronic venous insufficiency    Leg edema    Hypertensive retinopathy, grade 2 03/30/2012   Medial epicondylitis 02/11/2012   Superficial thrombophlebitis of left leg 07/15/2011   GERD (gastroesophageal reflux disease) 02/25/2011   Right knee pain 02/16/2011   Preventive measure 09/11/2010   Neck pain 07/03/2010   Grieving 04/17/2010   OSTEOPENIA 04/12/2006   Controlled type 2 diabetes mellitus with complication, with long-term current use of insulin ([place]) 03/08/2006   MORBID OBESITY 03/08/2006   Essential hypertension 03/08/2006   Past Medical History:  Diagnosis Date   Acute osteomyelitis of calcaneum, left ([place]) 07/10/2019   Cellulitis of left leg    history of, most recent episode 01/09   Chronic venous insufficiency    Diabetes mellitus with neurological  manifestation (HCC)    Diabetic foot infection ([place]) 07/10/2019   Diverticulitis    h/o 2-3 episodes in past   Group B streptococcal infection 07/10/2019   Hyperlipidemia LDL goal < 100    Hypertension goal BP (blood pressure) < 130/80    Leg edema    secondary to chronic venous insufficiency   Morbid obesity ([place])    Osteopenia    DEXA scan 7/07   Pseudomonas aeruginosa infection 07/10/2019    Family History  Problem Relation Age of Onset   Heart disease Mother    Heart disease Father    Heart disease Brother    Cancer Brother  Cancer Brother    Hypertension Sister    Heart disease Sister     Past Surgical History:  Procedure Laterality Date   ABDOMINAL HYSTERECTOMY     APPENDECTOMY     CHOLECYSTECTOMY     CHOLECYSTECTOMY N/A 04/14/2013   Procedure: LAPAROSCOPIC CHOLECYSTECTOMY WITH INTRAOPERATIVE CHOLANGIOGRAM;  Surgeon: Adin Hector, MD;  [facility];  Service: General;  Laterality: N/A;   COLECTOMY     with diverting colsotmy and revision in the 1990's for diverticulitis   ERCP N/A 04/17/2013   Procedure: ENDOSCOPIC RETROGRADE CHOLANGIOPANCREATOGRAPHY (ERCP);  Surgeon: Irene Shipper, MD;  Location[facility];  Service: Endoscopy;  Laterality: N/A;  note pt want general anesthesia for this case.  preferto perform in endo unit, but if unavailable please arrange time in [place]  8/13   left cataract removal & retinal repair   I & D EXTREMITY Left 07/26/2019   Procedure: LEFT PARTIAL CALCANEOUS EXCISION;  Surgeon: [name], MD;  Location: [facility];  Service: Orthopedics;  Laterality: Left;   INSERTION OF MESH N/A 07/30/2017   Procedure: INSERTION OF MESH;  Surgeon: Jovita Kussmaul, MD;  [facility];  Service: General;  Laterality: N/A;   LAPAROSCOPIC LYSIS OF ADHESIONS N/A 04/14/2013   Procedure: LAPAROSCOPIC LYSIS OF ADHESIONS;  Surgeon: Adin Hector, MD;  [facility];  Service: General;  Laterality: N/A;   LAPAROTOMY N/A 07/30/2017    Procedure: EXPLORATORY LAPAROTOMY;  Surgeon: Jovita Kussmaul, MD;  [facility];  Service: General;  Laterality: N/A;   LYSIS OF ADHESION N/A 07/30/2017   Procedure: EXTENSIVE LYSIS OF ADHESION;  Surgeon: Jovita Kussmaul, MD;  [facility];  Service: General;  Laterality: N/A;   OMENTECTOMY N/A 07/30/2017   Procedure: PARTIAL OMENTECTOMY;  Surgeon: Jovita Kussmaul, MD;  [facility];  Service: General;  Laterality: N/A;   VENTRAL HERNIA REPAIR N/A 07/30/2017   Procedure: HERNIA REPAIR VENTRAL ADULT;  Surgeon: Jovita Kussmaul, MD;  [facility];  Service: General;  Laterality: N/A;   Social History   Occupational History   Not on file  Tobacco Use   Smoking status: Never   Smokeless tobacco: Never  Vaping Use   Vaping Use: Never used  Substance and Sexual Activity   Alcohol use: No   Drug use: No   Sexual activity: Yes

## 2022-06-17 NOTE — Telephone Encounter (Signed)
Will submit.

## 2022-06-17 NOTE — Telephone Encounter (Signed)
Called and scheduled

## 2022-06-17 NOTE — Telephone Encounter (Signed)
Approved for Durolane Bilateral knee Buy and Bill $30 copay Covered @ 80%  No prior auth required

## 2022-06-22 ENCOUNTER — Other Ambulatory Visit (HOSPITAL_COMMUNITY): Payer: Self-pay

## 2022-06-24 ENCOUNTER — Encounter: Payer: Self-pay | Admitting: Family

## 2022-06-30 ENCOUNTER — Encounter: Payer: Self-pay | Admitting: Family

## 2022-06-30 ENCOUNTER — Ambulatory Visit: Payer: Medicare Other | Admitting: Family

## 2022-06-30 DIAGNOSIS — M170 Bilateral primary osteoarthritis of knee: Secondary | ICD-10-CM | POA: Diagnosis not present

## 2022-06-30 DIAGNOSIS — M1711 Unilateral primary osteoarthritis, right knee: Secondary | ICD-10-CM

## 2022-06-30 DIAGNOSIS — M1712 Unilateral primary osteoarthritis, left knee: Secondary | ICD-10-CM | POA: Diagnosis not present

## 2022-06-30 MED ORDER — SODIUM HYALURONATE 60 MG/3ML IX PRSY
60.0000 mg | PREFILLED_SYRINGE | INTRA_ARTICULAR | Status: AC | PRN
  Administered 2022-06-30: 60 mg via INTRA_ARTICULAR

## 2022-06-30 MED ORDER — LIDOCAINE HCL 1 % IJ SOLN
1.0000 mL | INTRAMUSCULAR | Status: AC | PRN
  Administered 2022-06-30: 1 mL

## 2022-06-30 NOTE — Progress Notes (Signed)
Office Visit Note   Patient: Debra [name]           Date of Birth: 04/11/1945           MRN: [id number] Visit Date: 06/30/2022              Requested by: Elizabeth Palau, FNP [address] [place],  [place] [address] PCP: Elizabeth Palau, FNP  Chief Complaint  Patient presents with   Right Knee - Follow-up    Bilateral durolane injection   Left Knee - Follow-up      HPI: The patient is a 77 year old woman who presents today for planned Durolane injection bilateral knees.  Assessment & Plan: Visit Diagnoses: No diagnosis found.  Plan: Durolane injection bilateral knees.  Patient tolerated well.  Discussed using anti-inflammatories and ice for the next few days will follow-up as needed  Follow-Up Instructions: No follow-ups on file.   Ortho Exam  Patient is alert, oriented, no adenopathy, well-dressed, normal affect, normal respiratory effort.   Imaging: No results found. No images are attached to the encounter.  Labs: Lab Results  Component Value Date   HGBA1C 8.6 (H) 07/22/2019   HGBA1C 8.9 (H) 07/27/2017   HGBA1C 7.6 (H) 05/22/2016   ESRSEDRATE 36 (H) 11/20/2019   ESRSEDRATE 60 (H) 07/28/2019   ESRSEDRATE 22 07/10/2019   CRP 10.9 (H) 11/20/2019   CRP 10.1 (H) 07/28/2019   CRP 3.8 07/10/2019   LABURIC 5.5 11/29/2014   LABURIC 4.6 10/01/2014   LABURIC 5.9 06/09/2013   REPTSTATUS 07/30/2019 FINAL 07/26/2019   GRAMSTAIN  07/26/2019    ABUNDANT WBC PRESENT,BOTH PMN AND MONONUCLEAR ABUNDANT GRAM POSITIVE COCCI FEW GRAM NEGATIVE RODS    CULT  07/26/2019    ABUNDANT GROUP B STREP(S.AGALACTIAE)ISOLATED TESTING AGAINST S. AGALACTIAE NOT ROUTINELY PERFORMED DUE TO PREDICTABILITY OF AMP/PEN/VAN SUSCEPTIBILITY. FEW STAPHYLOCOCCUS HAEMOLYTICUS ABUNDANT BACTEROIDES FRAGILIS BETA LACTAMASE POSITIVE Performed at [facility], [address], [place], [place] [address]    [facility] STAPHYLOCOCCUS HAEMOLYTICUS 07/26/2019     Lab Results   Component Value Date   ALBUMIN 3.2 (L) 07/22/2019   ALBUMIN 2.4 (L) 08/02/2017   ALBUMIN 3.1 (L) 07/28/2017    Lab Results  Component Value Date   MG 1.2 (L) 08/02/2017   MG 1.6 (L) 07/29/2017   MG 1.3 (L) 07/27/2017   Lab Results  Component Value Date   VD25OH 28.7 (L) 11/28/2012    No results found for: "PREALBUMIN"    Latest Ref Rng & Units 11/20/2019   10:27 AM 07/31/2019    4:18 AM 07/30/2019    4:26 AM  CBC EXTENDED  WBC 3.8 - 10.8 Thousand/uL 5.5  6.9  8.0   RBC 3.80 - 5.10 Million/uL 3.53  3.59  3.58   Hemoglobin 11.7 - 15.5 g/dL 97.6  73.4  19.3   HCT 35.0 - 45.0 % 32.6  33.1  33.0   Platelets 140 - 400 Thousand/uL 282  298  296   NEUT# 1,500 - 7,800 cells/uL 2,602     Lymph# 850 - 3,900 cells/uL 1,771        There is no height or weight on file to calculate BMI.  Orders:  No orders of the defined types were placed in this encounter.  No orders of the defined types were placed in this encounter.    Procedures: Large Joint Inj: bilateral knee on 06/30/2022 11:02 AM Indications: pain Details: 18 G 1.5 in needle, anteromedial approach Medications (Right): 1 mL  lidocaine 1 %; 60 mg Sodium Hyaluronate 60 MG/3ML Medications (Left): 1 mL lidocaine 1 %; 60 mg Sodium Hyaluronate 60 MG/3ML Consent was given by the patient.      Clinical Data: No additional findings.  ROS:  All other systems negative, except as noted in the HPI. Review of Systems  Objective: Vital Signs: LMP 07/02/1973   Specialty Comments:  No specialty comments available.  PMFS History: Patient Active Problem List   Diagnosis Date Noted   Cutaneous abscess of left foot    Osteomyelitis 07/22/2019   Diabetic foot infection 07/10/2019   Acute osteomyelitis of left calcaneus 07/10/2019   Group B streptococcal infection 07/10/2019   Bacterial infection due to Morganella morganii 07/10/2019   Pseudogout of right knee 08/02/2017   SBO (small bowel obstruction) 07/22/2017   Abnormal  LFTs 03/20/2016   Abdominal pain, left lower quadrant 07/04/2015   Type 2 diabetes mellitus with diabetic polyneuropathy 10/06/2013   Hyperlipidemia associated with type 2 diabetes mellitus (HCC) 10/06/2013   Fungal dermatitis 08/11/2013   Hyperlipidemia 06/09/2013   Choledocholithiasis with acute cholecystitis 04/14/2013   Symptomatic cholelithiasis 04/13/2013   Neuropathic pain of both legs 04/04/2013   Severe obesity (BMI >= 40) 11/28/2012   Obesity (BMI 30-39.9) 11/28/2012   Diabetes mellitus with neurological manifestation (HCC)    Hypertension goal BP (blood pressure) < 130/80    Osteopenia    Chronic venous insufficiency    Leg edema    Hypertensive retinopathy, grade 2 03/30/2012   Medial epicondylitis 02/11/2012   Superficial thrombophlebitis of left leg 07/15/2011   GERD (gastroesophageal reflux disease) 02/25/2011   Right knee pain 02/16/2011   Preventive measure 09/11/2010   Neck pain 07/03/2010   Grieving 04/17/2010   OSTEOPENIA 04/12/2006   Controlled type 2 diabetes mellitus with complication, with long-term current use of insulin 03/08/2006   MORBID OBESITY 03/08/2006   Essential hypertension 03/08/2006   Past Medical History:  Diagnosis Date   Acute osteomyelitis of calcaneum, left 07/10/2019   Cellulitis of left leg    history of, most recent episode 01/09   Chronic venous insufficiency    Diabetes mellitus with neurological manifestation    Diabetic foot infection 07/10/2019   Diverticulitis    h/o 2-3 episodes in past   Group B streptococcal infection 07/10/2019   Hyperlipidemia LDL goal < 100    Hypertension goal BP (blood pressure) < 130/80    Leg edema    secondary to chronic venous insufficiency   Morbid obesity    Osteopenia    DEXA scan 7/07   Pseudomonas aeruginosa infection 07/10/2019    Family History  Problem Relation Age of Onset   Heart disease Mother    Heart disease Father    Heart disease Brother    Cancer Brother    Cancer Brother     Hypertension Sister    Heart disease Sister     Past Surgical History:  Procedure Laterality Date   ABDOMINAL HYSTERECTOMY     APPENDECTOMY     CHOLECYSTECTOMY     CHOLECYSTECTOMY N/A 04/14/2013   Procedure: LAPAROSCOPIC CHOLECYSTECTOMY WITH INTRAOPERATIVE CHOLANGIOGRAM;  Surgeon: Ernestene Mention, MD;  Location: [facility];  Service: General;  Laterality: N/A;   COLECTOMY     with diverting colsotmy and revision in the 1990's for diverticulitis   ERCP N/A 04/17/2013   Procedure: ENDOSCOPIC RETROGRADE CHOLANGIOPANCREATOGRAPHY (ERCP);  Surgeon: Hilarie Fredrickson, MD;  Location: [facility];  Service: Endoscopy;  Laterality: N/A;  note pt want  general anesthesia for this case.  preferto perform in endo unit, but if unavailable please arrange time in OR   EYE SURGERY  8/13   left cataract removal & retinal repair   I & D EXTREMITY Left 07/26/2019   Procedure: LEFT PARTIAL CALCANEOUS EXCISION;  Surgeon: Nadara Mustarduda, Marcus V, MD;  [facility];  Service: Orthopedics;  Laterality: Left;   INSERTION OF MESH N/A 07/30/2017   Procedure: INSERTION OF MESH;  Surgeon: Griselda Mineroth, Paul III, MD;  Location: [facility];  Service: General;  Laterality: N/A;   LAPAROSCOPIC LYSIS OF ADHESIONS N/A 04/14/2013   Procedure: LAPAROSCOPIC LYSIS OF ADHESIONS;  Surgeon: Ernestene MentionHaywood M Ingram, MD;  Location: [facility];  Service: General;  Laterality: N/A;   LAPAROTOMY N/A 07/30/2017   Procedure: EXPLORATORY LAPAROTOMY;  Surgeon: Griselda Mineroth, Paul III, MD;  Location: [facility];  Service: General;  Laterality: N/A;   LYSIS OF ADHESION N/A 07/30/2017   Procedure: EXTENSIVE LYSIS OF ADHESION;  Surgeon: Griselda Mineroth, Paul III, MD;  Location: [facility];  Service: General;  Laterality: N/A;   OMENTECTOMY N/A 07/30/2017   Procedure: PARTIAL OMENTECTOMY;  Surgeon: Griselda Mineroth, Paul III, MD;  Location: [facility];  Service: General;  Laterality: N/A;   VENTRAL HERNIA REPAIR N/A 07/30/2017   Procedure: HERNIA REPAIR VENTRAL ADULT;  Surgeon: Griselda Mineroth, Paul III, MD;  Location: [facility];  Service:  General;  Laterality: N/A;   Social History   Occupational History   Not on file  Tobacco Use   Smoking status: Never   Smokeless tobacco: Never  Vaping Use   Vaping Use: Never used  Substance and Sexual Activity   Alcohol use: No   Drug use: No   Sexual activity: Yes

## 2022-07-13 ENCOUNTER — Telehealth: Payer: Self-pay | Admitting: Orthopedic Surgery

## 2022-07-13 NOTE — Telephone Encounter (Signed)
Patient transferred to triage. She states that her left wrist pain has gotten much worse. She is now having difficulty sleeping, cannot squeeze a mop, and is having pain holding on to things. Erin injected the left CMC on 06/17/2022.  She reports about 30% relief for about one week. She is currently taking aleve with no relief. She would like to know what to do. She states that the pain in the knuckle is the worst.  Patient uses [facility] on [address] [phone]

## 2022-07-13 NOTE — Telephone Encounter (Signed)
Can you please call this pt and see if she can come in tomorrow at : for Erin? If so I will make the appt.

## 2022-07-13 NOTE — Telephone Encounter (Signed)
Patient stating that her wrist is very painful and want to know what to do. Sending call to Triage.

## 2022-07-14 ENCOUNTER — Other Ambulatory Visit: Payer: Self-pay

## 2022-07-14 ENCOUNTER — Ambulatory Visit: Payer: Medicare Other | Admitting: Orthopaedic Surgery

## 2022-07-14 DIAGNOSIS — M25532 Pain in left wrist: Secondary | ICD-10-CM | POA: Diagnosis not present

## 2022-07-14 MED ORDER — LIDOCAINE HCL 1 % IJ SOLN
1.0000 mL | INTRAMUSCULAR | Status: AC | PRN
Start: 2022-07-14 — End: 2022-07-14
  Administered 2022-07-14: 1 mL

## 2022-07-14 MED ORDER — METHYLPREDNISOLONE ACETATE 40 MG/ML IJ SUSP
40.0000 mg | INTRAMUSCULAR | Status: AC | PRN
Start: 2022-07-14 — End: 2022-07-14
  Administered 2022-07-14: 40 mg via INTRA_ARTICULAR

## 2022-07-14 MED ORDER — BUPIVACAINE HCL 0.5 % IJ SOLN
1.0000 mL | INTRAMUSCULAR | Status: AC | PRN
Start: 2022-07-14 — End: 2022-07-14
  Administered 2022-07-14: 1 mL via INTRA_ARTICULAR

## 2022-07-14 NOTE — Progress Notes (Signed)
Office Visit Note   Patient: Debra [name]           Date of Birth: 1945-09-22           MRN: [id number] Visit Date: 07/14/2022              Requested by: Elizabeth Palau, FNP [address] [place],  [place] [address] PCP: Elizabeth Palau, FNP  Chief Complaint  Patient presents with   Left Wrist - Follow-up      HPI: Patient returns today for chronic left wrist pain.  Denies any injuries.  Saw Barnie Del couple weeks ago for this which did not improve with symptomatic treatment.  Assessment & Plan: Visit Diagnoses:  1. Left wrist pain     Plan: Impression is left wrist DJD exacerbation.  She has a fair amount of degenerative changes within the radiocarpal joint.  Based on treatment options she elected for to undergo steroid injection.  We will immobilize with a wrist brace for couple weeks and then wean as tolerated.  Follow-up as needed.  Follow-Up Instructions: No follow-ups on file.   Ortho Exam  Patient is alert, oriented, no adenopathy, well-dressed, normal affect, normal respiratory effort. Examination left wrist shows pain with wrist range of motion.  There is no evidence of infection.  Imaging: No results found. No images are attached to the encounter.  Labs: Lab Results  Component Value Date   HGBA1C 8.6 (H) 07/22/2019   HGBA1C 8.9 (H) 07/27/2017   HGBA1C 7.6 (H) 05/22/2016   ESRSEDRATE 36 (H) 11/20/2019   ESRSEDRATE 60 (H) 07/28/2019   ESRSEDRATE 22 07/10/2019   CRP 10.9 (H) 11/20/2019   CRP 10.1 (H) 07/28/2019   CRP 3.8 07/10/2019   LABURIC 5.5 11/29/2014   LABURIC 4.6 10/01/2014   LABURIC 5.9 06/09/2013   REPTSTATUS 07/30/2019 FINAL 07/26/2019   GRAMSTAIN  07/26/2019    ABUNDANT WBC PRESENT,BOTH PMN AND MONONUCLEAR ABUNDANT GRAM POSITIVE COCCI FEW GRAM NEGATIVE RODS    CULT  07/26/2019    ABUNDANT GROUP B STREP(S.AGALACTIAE)ISOLATED TESTING AGAINST S. AGALACTIAE NOT ROUTINELY PERFORMED DUE TO PREDICTABILITY OF  AMP/PEN/VAN SUSCEPTIBILITY. FEW STAPHYLOCOCCUS HAEMOLYTICUS ABUNDANT BACTEROIDES FRAGILIS BETA LACTAMASE POSITIVE Performed at [facility], [address], [place], [place] [address]    [facility] STAPHYLOCOCCUS HAEMOLYTICUS 07/26/2019     Lab Results  Component Value Date   ALBUMIN 3.2 (L) 07/22/2019   ALBUMIN 2.4 (L) 08/02/2017   ALBUMIN 3.1 (L) 07/28/2017    Lab Results  Component Value Date   MG 1.2 (L) 08/02/2017   MG 1.6 (L) 07/29/2017   MG 1.3 (L) 07/27/2017   Lab Results  Component Value Date   VD25OH 28.7 (L) 11/28/2012    No results found for: "PREALBUMIN"    Latest Ref Rng & Units 11/20/2019   10:27 AM 07/31/2019    4:18 AM 07/30/2019    4:26 AM  CBC EXTENDED  WBC 3.8 - 10.8 Thousand/uL 5.5  6.9  8.0   RBC 3.80 - 5.10 Million/uL 3.53  3.59  3.58   Hemoglobin 11.7 - 15.5 g/dL 08.6  57.8  46.9   HCT 35.0 - 45.0 % 32.6  33.1  33.0   Platelets 140 - 400 Thousand/uL 282  298  296   NEUT# 1,500 - 7,800 cells/uL 2,602     Lymph# 850 - 3,900 cells/uL 1,771        There is no height or weight on file to calculate BMI.  Orders:  Orders Placed This Encounter  Procedures   Medium Joint Inj: L radiocarpal   XR Wrist Complete Left   No orders of the defined types were placed in this encounter.    Procedures: Medium Joint Inj: L radiocarpal on 07/14/2022 11:25 AM Indications: pain Details: 25 G needle, dorsal approach Medications: 1 mL lidocaine 1 %; 1 mL bupivacaine 0.5 %; 40 mg methylPREDNISolone acetate 40 MG/ML Outcome: tolerated well, no immediate complications Patient was prepped and draped in the usual sterile fashion.      Clinical Data: No additional findings.  ROS:  All other systems negative, except as noted in the HPI. Review of Systems  Objective: Vital Signs: LMP 07/02/1973   Specialty Comments:  No specialty comments available.  PMFS History: Patient Active Problem List   Diagnosis Date Noted   Cutaneous abscess of left  foot    Osteomyelitis 07/22/2019   Diabetic foot infection 07/10/2019   Acute osteomyelitis of left calcaneus 07/10/2019   Group B streptococcal infection 07/10/2019   Bacterial infection due to Morganella morganii 07/10/2019   Pseudogout of right knee 08/02/2017   SBO (small bowel obstruction) 07/22/2017   Abnormal LFTs 03/20/2016   Abdominal pain, left lower quadrant 07/04/2015   Type 2 diabetes mellitus with diabetic polyneuropathy 10/06/2013   Hyperlipidemia associated with type 2 diabetes mellitus (HCC) 10/06/2013   Fungal dermatitis 08/11/2013   Hyperlipidemia 06/09/2013   Choledocholithiasis with acute cholecystitis 04/14/2013   Symptomatic cholelithiasis 04/13/2013   Neuropathic pain of both legs 04/04/2013   Severe obesity (BMI >= 40) 11/28/2012   Obesity (BMI 30-39.9) 11/28/2012   Diabetes mellitus with neurological manifestation (HCC)    Hypertension goal BP (blood pressure) < 130/80    Osteopenia    Chronic venous insufficiency    Leg edema    Hypertensive retinopathy, grade 2 03/30/2012   Medial epicondylitis 02/11/2012   Superficial thrombophlebitis of left leg 07/15/2011   GERD (gastroesophageal reflux disease) 02/25/2011   Right knee pain 02/16/2011   Preventive measure 09/11/2010   Neck pain 07/03/2010   Grieving 04/17/2010   OSTEOPENIA 04/12/2006   Controlled type 2 diabetes mellitus with complication, with long-term current use of insulin 03/08/2006   MORBID OBESITY 03/08/2006   Essential hypertension 03/08/2006   Past Medical History:  Diagnosis Date   Acute osteomyelitis of calcaneum, left 07/10/2019   Cellulitis of left leg    history of, most recent episode 01/09   Chronic venous insufficiency    Diabetes mellitus with neurological manifestation    Diabetic foot infection 07/10/2019   Diverticulitis    h/o 2-3 episodes in past   Group B streptococcal infection 07/10/2019   Hyperlipidemia LDL goal < 100    Hypertension goal BP (blood pressure) <  130/80    Leg edema    secondary to chronic venous insufficiency   Morbid obesity    Osteopenia    DEXA scan 7/07   Pseudomonas aeruginosa infection 07/10/2019    Family History  Problem Relation Age of Onset   Heart disease Mother    Heart disease Father    Heart disease Brother    Cancer Brother    Cancer Brother    Hypertension Sister    Heart disease Sister     Past Surgical History:  Procedure Laterality Date   ABDOMINAL HYSTERECTOMY     APPENDECTOMY     CHOLECYSTECTOMY     CHOLECYSTECTOMY N/A 04/14/2013   Procedure: LAPAROSCOPIC CHOLECYSTECTOMY WITH INTRAOPERATIVE CHOLANGIOGRAM;  Surgeon: Ernestene Mention, MD;  Location: [facility];  Service: General;  Laterality: N/A;   COLECTOMY     with diverting colsotmy and revision in the 1990's for diverticulitis   ERCP N/A 04/17/2013   Procedure: ENDOSCOPIC RETROGRADE CHOLANGIOPANCREATOGRAPHY (ERCP);  Surgeon: Hilarie Fredrickson, MD;  Location: [facility];  Service: Endoscopy;  Laterality: N/A;  note pt want general anesthesia for this case.  preferto perform in endo unit, but if unavailable please arrange time in OR   EYE SURGERY  8/13   left cataract removal & retinal repair   I & D EXTREMITY Left 07/26/2019   Procedure: LEFT PARTIAL CALCANEOUS EXCISION;  Surgeon: Nadara Mustard, MD;  Location: [facility] OR;  Service: Orthopedics;  Laterality: Left;   INSERTION OF MESH N/A 07/30/2017   Procedure: INSERTION OF MESH;  Surgeon: Griselda Miner, MD;  Location: [facility];  Service: General;  Laterality: N/A;   LAPAROSCOPIC LYSIS OF ADHESIONS N/A 04/14/2013   Procedure: LAPAROSCOPIC LYSIS OF ADHESIONS;  Surgeon: Ernestene Mention, MD;  Location: [facility];  Service: General;  Laterality: N/A;   LAPAROTOMY N/A 07/30/2017   Procedure: EXPLORATORY LAPAROTOMY;  Surgeon: Griselda Miner, MD;  Location: [facility];  Service: General;  Laterality: N/A;   LYSIS OF ADHESION N/A 07/30/2017   Procedure: EXTENSIVE LYSIS OF ADHESION;  Surgeon: Griselda Miner, MD;  Location: [facility];   Service: General;  Laterality: N/A;   OMENTECTOMY N/A 07/30/2017   Procedure: PARTIAL OMENTECTOMY;  Surgeon: Griselda Miner, MD;  Location: [facility];  Service: General;  Laterality: N/A;   VENTRAL HERNIA REPAIR N/A 07/30/2017   Procedure: HERNIA REPAIR VENTRAL ADULT;  Surgeon: Griselda Miner, MD;  Location: [facility];  Service: General;  Laterality: N/A;   Social History   Occupational History   Not on file  Tobacco Use   Smoking status: Never   Smokeless tobacco: Never  Vaping Use   Vaping Use: Never used  Substance and Sexual Activity   Alcohol use: No   Drug use: No   Sexual activity: Yes

## 2022-07-28 ENCOUNTER — Telehealth: Payer: Self-pay | Admitting: Orthopaedic Surgery

## 2022-07-28 ENCOUNTER — Telehealth: Payer: Self-pay | Admitting: Radiology

## 2022-07-28 MED ORDER — PREDNISONE 10 MG (21) PO TBPK
ORAL_TABLET | ORAL | 3 refills | Status: DC
Start: 2022-07-28 — End: 2023-01-13

## 2022-07-28 NOTE — Telephone Encounter (Signed)
Patient states she is still waiting on a call about medication. Please call ASAP

## 2022-07-28 NOTE — Telephone Encounter (Signed)
done

## 2022-07-28 NOTE — Telephone Encounter (Signed)
Sent message to Dr.Xu.

## 2022-07-28 NOTE — Telephone Encounter (Signed)
Patient left message on Triage phone. She states that she was seen at the end of April, that Dr. Roda Shutters did an injection in her hand/wrist, but she states that it has not helped her at all and she is wearing the brace without relief. She states that Dr. Roda Shutters said he would give her some steroid pills to try to help her. She would like a call back to advise. Call back number #[phone]

## 2022-07-28 NOTE — Telephone Encounter (Signed)
Patient advising that the injection did not help and she is wanting to speak to a technician. Please advise..[phone]. Transferring call to Triage. Patient advising she is in severe pain

## 2022-07-28 NOTE — Addendum Note (Signed)
Addended by: Mayra Reel on: 07/28/2022 05:08 PM   Modules accepted: Orders

## 2022-07-29 ENCOUNTER — Telehealth: Payer: Self-pay

## 2022-07-29 NOTE — Telephone Encounter (Signed)
Called patient. Explained that the steroid pack takes time to work and is not going to be immediate pain relief.

## 2022-07-29 NOTE — Telephone Encounter (Signed)
Patient called stating that she just received her Rx this morning and would like to know how long will it take for the the medication to start helping.  Stated that she is in a lot of pain.  Cb# [phone].  Please advise.  Thank you.

## 2022-07-29 NOTE — Telephone Encounter (Signed)
Tried to call to let patient know that medication has been sent in. No answer. No voicemail.

## 2022-10-27 ENCOUNTER — Ambulatory Visit: Payer: Medicare Other | Admitting: Orthopedic Surgery

## 2022-10-27 ENCOUNTER — Encounter: Payer: Self-pay | Admitting: Orthopedic Surgery

## 2022-10-27 DIAGNOSIS — M1712 Unilateral primary osteoarthritis, left knee: Secondary | ICD-10-CM | POA: Diagnosis not present

## 2022-10-27 MED ORDER — METHYLPREDNISOLONE ACETATE 40 MG/ML IJ SUSP
40.0000 mg | INTRAMUSCULAR | Status: AC | PRN
Start: 2022-10-27 — End: 2022-10-27
  Administered 2022-10-27: 40 mg via INTRA_ARTICULAR

## 2022-10-27 MED ORDER — LIDOCAINE HCL (PF) 1 % IJ SOLN
5.0000 mL | INTRAMUSCULAR | Status: AC | PRN
Start: 2022-10-27 — End: 2022-10-27
  Administered 2022-10-27: 5 mL

## 2022-10-27 NOTE — Progress Notes (Signed)
Office Visit Note   Patient: Debra [name]           Date of Birth: [birth year]           MRN: [id number] Visit Date: 10/27/2022              Requested by: Elizabeth Palau, FNP [address] [place],  [place] [address] PCP: Elizabeth Palau, FNP  Chief Complaint  Patient presents with   Left Knee - Pain      HPI: Patient is a 77 year old woman who is s/p fall.  She states she landed on her left buttocks and left wrist.  She states that since her fall she has been having increasing left knee pain.  Assessment & Plan: Visit Diagnoses:  1. Primary osteoarthritis of left knee     Plan: Left knee was injected she tolerated this well plan to follow-up as needed.  Follow-Up Instructions: Return if symptoms worsen or fail to improve.   Ortho Exam  Patient is alert, oriented, no adenopathy, well-dressed, normal affect, normal respiratory effort. Examination patient is able to ambulate with a cane.  She does have some bruising on the left hip.  There is no bruising on the wrist she has good range of motion of her fingers.  There is no ecchymosis bruising or swelling around the left knee.  There is crepitation with range of motion the patella is nontender to palpation the extensor mechanism is intact varus and valgus stress is stable.  Imaging: No results found. No images are attached to the encounter.  Labs: Lab Results  Component Value Date   HGBA1C 8.6 (H) 07/22/2019   HGBA1C 8.9 (H) 07/27/2017   HGBA1C 7.6 (H) 05/22/2016   ESRSEDRATE 36 (H) 11/20/2019   ESRSEDRATE 60 (H) 07/28/2019   ESRSEDRATE 22 07/10/2019   CRP 10.9 (H) 11/20/2019   CRP 10.1 (H) 07/28/2019   CRP 3.8 07/10/2019   LABURIC 5.5 11/29/2014   LABURIC 4.6 10/01/2014   LABURIC 5.9 06/09/2013   REPTSTATUS 07/30/2019 FINAL 07/26/2019   GRAMSTAIN  07/26/2019    ABUNDANT WBC PRESENT,BOTH PMN AND MONONUCLEAR ABUNDANT GRAM POSITIVE COCCI FEW GRAM NEGATIVE RODS    CULT  07/26/2019     ABUNDANT GROUP B STREP(S.AGALACTIAE)ISOLATED TESTING AGAINST S. AGALACTIAE NOT ROUTINELY PERFORMED DUE TO PREDICTABILITY OF AMP/PEN/VAN SUSCEPTIBILITY. FEW STAPHYLOCOCCUS HAEMOLYTICUS ABUNDANT BACTEROIDES FRAGILIS BETA LACTAMASE POSITIVE Performed at [facility], [address] [place], [place] [address]    [facility] STAPHYLOCOCCUS HAEMOLYTICUS 07/26/2019     Lab Results  Component Value Date   ALBUMIN 3.2 (L) 07/22/2019   ALBUMIN 2.4 (L) 08/02/2017   ALBUMIN 3.1 (L) 07/28/2017    Lab Results  Component Value Date   MG 1.2 (L) 08/02/2017   MG 1.6 (L) 07/29/2017   MG 1.3 (L) 07/27/2017   Lab Results  Component Value Date   VD25OH 28.7 (L) 11/28/2012    No results found for: "PREALBUMIN"    Latest Ref Rng & Units 11/20/2019   10:27 AM 07/31/2019    4:18 AM 07/30/2019    4:26 AM  CBC EXTENDED  WBC 3.8 - 10.8 Thousand/uL 5.5  6.9  8.0   RBC 3.80 - 5.10 Million/uL 3.53  3.59  3.58   Hemoglobin 11.7 - 15.5 g/dL 62.1  30.8  65.7   HCT 35.0 - 45.0 % 32.6  33.1  33.0   Platelets 140 - 400 Thousand/uL 282  298  296   NEUT# 1,500 - 7,800 cells/uL  2,602     Lymph# 850 - 3,900 cells/uL 1,771        There is no height or weight on file to calculate BMI.  Orders:  No orders of the defined types were placed in this encounter.  No orders of the defined types were placed in this encounter.    Procedures: Large Joint Inj: L knee on 10/27/2022 11:27 AM Indications: pain and diagnostic evaluation Details: 22 G 1.5 in needle, anteromedial approach  Arthrogram: No  Medications: 5 mL lidocaine (PF) 1 %; 40 mg methylPREDNISolone acetate 40 MG/ML Outcome: tolerated well, no immediate complications Procedure, treatment alternatives, risks and benefits explained, specific risks discussed. Consent was given by the patient. Immediately prior to procedure a time out was called to verify the correct patient, procedure, equipment, support staff and site/side marked as required. Patient  was prepped and draped in the usual sterile fashion.      Clinical Data: No additional findings.  ROS:  All other systems negative, except as noted in the HPI. Review of Systems  Objective: Vital Signs: LMP 07/02/1973   Specialty Comments:  No specialty comments available.  PMFS History: Patient Active Problem List   Diagnosis Date Noted   Cutaneous abscess of left foot    Osteomyelitis (HCC) 07/22/2019   Diabetic foot infection (HCC) 07/10/2019   Acute osteomyelitis of left calcaneus (HCC) 07/10/2019   Group B streptococcal infection 07/10/2019   Bacterial infection due to Morganella morganii 07/10/2019   Pseudogout of right knee 08/02/2017   SBO (small bowel obstruction) (HCC) 07/22/2017   Abnormal LFTs 03/20/2016   Abdominal pain, left lower quadrant 07/04/2015   Type 2 diabetes mellitus with diabetic polyneuropathy (HCC) 10/06/2013   Hyperlipidemia associated with type 2 diabetes mellitus (HCC) 10/06/2013   Fungal dermatitis 08/11/2013   Hyperlipidemia 06/09/2013   Choledocholithiasis with acute cholecystitis 04/14/2013   Symptomatic cholelithiasis 04/13/2013   Neuropathic pain of both legs 04/04/2013   Severe obesity (BMI >= 40) (HCC) 11/28/2012   Obesity (BMI 30-39.9) 11/28/2012   Diabetes mellitus with neurological manifestation (HCC)    Hypertension goal BP (blood pressure) < 130/80    Osteopenia    Chronic venous insufficiency    Leg edema    Hypertensive retinopathy, grade 2 03/30/2012   Medial epicondylitis 02/11/2012   Superficial thrombophlebitis of left leg 07/15/2011   GERD (gastroesophageal reflux disease) 02/25/2011   Right knee pain 02/16/2011   Preventive measure 09/11/2010   Neck pain 07/03/2010   Grieving 04/17/2010   OSTEOPENIA 04/12/2006   Controlled type 2 diabetes mellitus with complication, with long-term current use of insulin (HCC) 03/08/2006   MORBID OBESITY 03/08/2006   Essential hypertension 03/08/2006   Past Medical History:   Diagnosis Date   Acute osteomyelitis of calcaneum, left (HCC) 07/10/2019   Cellulitis of left leg    history of, most recent episode 01/09   Chronic venous insufficiency    Diabetes mellitus with neurological manifestation (HCC)    Diabetic foot infection (HCC) 07/10/2019   Diverticulitis    h/o 2-3 episodes in past   Group B streptococcal infection 07/10/2019   Hyperlipidemia LDL goal < 100    Hypertension goal BP (blood pressure) < 130/80    Leg edema    secondary to chronic venous insufficiency   Morbid obesity (HCC)    Osteopenia    DEXA scan 7/07   Pseudomonas aeruginosa infection 07/10/2019    Family History  Problem Relation Age of Onset   Heart disease Mother  Heart disease Father    Heart disease Brother    Cancer Brother    Cancer Brother    Hypertension Sister    Heart disease Sister     Past Surgical History:  Procedure Laterality Date   ABDOMINAL HYSTERECTOMY     APPENDECTOMY     CHOLECYSTECTOMY     CHOLECYSTECTOMY N/A 04/14/2013   Procedure: LAPAROSCOPIC CHOLECYSTECTOMY WITH INTRAOPERATIVE CHOLANGIOGRAM;  Surgeon: Ernestene Mention, MD;  Location: [facility];  Service: General;  Laterality: N/A;   COLECTOMY     with diverting colsotmy and revision in the 1990's for diverticulitis   ERCP N/A 04/17/2013   Procedure: ENDOSCOPIC RETROGRADE CHOLANGIOPANCREATOGRAPHY (ERCP);  Surgeon: Hilarie Fredrickson, MD;  Location: [facility];  Service: Endoscopy;  Laterality: N/A;  note pt want general anesthesia for this case.  preferto perform in endo unit, but if unavailable please arrange time in OR   EYE SURGERY  8/13   left cataract removal & retinal repair   I & D EXTREMITY Left 07/26/2019   Procedure: LEFT PARTIAL CALCANEOUS EXCISION;  Surgeon: Nadara Mustard, MD;  Location: [facility] OR;  Service: Orthopedics;  Laterality: Left;   INSERTION OF MESH N/A 07/30/2017   Procedure: INSERTION OF MESH;  Surgeon: Griselda Miner, MD;  Location: [facility];  Service: General;  Laterality: N/A;    LAPAROSCOPIC LYSIS OF ADHESIONS N/A 04/14/2013   Procedure: LAPAROSCOPIC LYSIS OF ADHESIONS;  Surgeon: Ernestene Mention, MD;  Location: [facility];  Service: General;  Laterality: N/A;   LAPAROTOMY N/A 07/30/2017   Procedure: EXPLORATORY LAPAROTOMY;  Surgeon: Griselda Miner, MD;  Location: [facility];  Service: General;  Laterality: N/A;   LYSIS OF ADHESION N/A 07/30/2017   Procedure: EXTENSIVE LYSIS OF ADHESION;  Surgeon: Griselda Miner, MD;  Location: [facility];  Service: General;  Laterality: N/A;   OMENTECTOMY N/A 07/30/2017   Procedure: PARTIAL OMENTECTOMY;  Surgeon: Griselda Miner, MD;  Location: [facility];  Service: General;  Laterality: N/A;   VENTRAL HERNIA REPAIR N/A 07/30/2017   Procedure: HERNIA REPAIR VENTRAL ADULT;  Surgeon: Griselda Miner, MD;  Location: [facility];  Service: General;  Laterality: N/A;   Social History   Occupational History   Not on file  Tobacco Use   Smoking status: Never   Smokeless tobacco: Never  Vaping Use   Vaping status: Never Used  Substance and Sexual Activity   Alcohol use: No   Drug use: No   Sexual activity: Yes

## 2023-01-12 ENCOUNTER — Telehealth: Payer: Self-pay | Admitting: Physician Assistant

## 2023-01-12 ENCOUNTER — Inpatient Hospital Stay (HOSPITAL_COMMUNITY)
Admission: EM | Admit: 2023-01-12 | Discharge: 2023-01-15 | DRG: 378 | Disposition: A | Discharge status: Home / Self Care | Payer: Medicare Other | Source: Ambulatory Visit | Attending: Internal Medicine | Admitting: Internal Medicine

## 2023-01-12 ENCOUNTER — Other Ambulatory Visit: Payer: Self-pay

## 2023-01-12 DIAGNOSIS — Z794 Long term (current) use of insulin: Secondary | ICD-10-CM

## 2023-01-12 DIAGNOSIS — R195 Other fecal abnormalities: Secondary | ICD-10-CM

## 2023-01-12 DIAGNOSIS — Z79899 Other long term (current) drug therapy: Secondary | ICD-10-CM

## 2023-01-12 DIAGNOSIS — K648 Other hemorrhoids: Secondary | ICD-10-CM | POA: Diagnosis present

## 2023-01-12 DIAGNOSIS — D62 Acute posthemorrhagic anemia: Secondary | ICD-10-CM | POA: Diagnosis present

## 2023-01-12 DIAGNOSIS — I872 Venous insufficiency (chronic) (peripheral): Secondary | ICD-10-CM | POA: Diagnosis present

## 2023-01-12 DIAGNOSIS — E1149 Type 2 diabetes mellitus with other diabetic neurological complication: Secondary | ICD-10-CM | POA: Diagnosis present

## 2023-01-12 DIAGNOSIS — Z885 Allergy status to narcotic agent status: Secondary | ICD-10-CM

## 2023-01-12 DIAGNOSIS — I890 Lymphedema, not elsewhere classified: Secondary | ICD-10-CM | POA: Diagnosis present

## 2023-01-12 DIAGNOSIS — M1990 Unspecified osteoarthritis, unspecified site: Secondary | ICD-10-CM | POA: Diagnosis present

## 2023-01-12 DIAGNOSIS — D649 Anemia, unspecified: Principal | ICD-10-CM

## 2023-01-12 DIAGNOSIS — Z9842 Cataract extraction status, left eye: Secondary | ICD-10-CM

## 2023-01-12 DIAGNOSIS — K254 Chronic or unspecified gastric ulcer with hemorrhage: Principal | ICD-10-CM | POA: Diagnosis present

## 2023-01-12 DIAGNOSIS — Z9049 Acquired absence of other specified parts of digestive tract: Secondary | ICD-10-CM

## 2023-01-12 DIAGNOSIS — Z888 Allergy status to other drugs, medicaments and biological substances status: Secondary | ICD-10-CM

## 2023-01-12 DIAGNOSIS — Z809 Family history of malignant neoplasm, unspecified: Secondary | ICD-10-CM

## 2023-01-12 DIAGNOSIS — K5730 Diverticulosis of large intestine without perforation or abscess without bleeding: Secondary | ICD-10-CM | POA: Diagnosis present

## 2023-01-12 DIAGNOSIS — E119 Type 2 diabetes mellitus without complications: Secondary | ICD-10-CM

## 2023-01-12 DIAGNOSIS — K922 Gastrointestinal hemorrhage, unspecified: Secondary | ICD-10-CM | POA: Diagnosis present

## 2023-01-12 DIAGNOSIS — Z791 Long term (current) use of non-steroidal anti-inflammatories (NSAID): Secondary | ICD-10-CM

## 2023-01-12 DIAGNOSIS — E1151 Type 2 diabetes mellitus with diabetic peripheral angiopathy without gangrene: Secondary | ICD-10-CM | POA: Diagnosis present

## 2023-01-12 DIAGNOSIS — Z7985 Long-term (current) use of injectable non-insulin antidiabetic drugs: Secondary | ICD-10-CM

## 2023-01-12 DIAGNOSIS — Z880 Allergy status to penicillin: Secondary | ICD-10-CM

## 2023-01-12 DIAGNOSIS — I10 Essential (primary) hypertension: Secondary | ICD-10-CM | POA: Diagnosis present

## 2023-01-12 DIAGNOSIS — D509 Iron deficiency anemia, unspecified: Secondary | ICD-10-CM

## 2023-01-12 DIAGNOSIS — Z7984 Long term (current) use of oral hypoglycemic drugs: Secondary | ICD-10-CM

## 2023-01-12 DIAGNOSIS — E785 Hyperlipidemia, unspecified: Secondary | ICD-10-CM

## 2023-01-12 DIAGNOSIS — D126 Benign neoplasm of colon, unspecified: Secondary | ICD-10-CM

## 2023-01-12 DIAGNOSIS — E66812 Obesity, class 2: Secondary | ICD-10-CM | POA: Diagnosis present

## 2023-01-12 DIAGNOSIS — Z91018 Allergy to other foods: Secondary | ICD-10-CM

## 2023-01-12 DIAGNOSIS — Z8249 Family history of ischemic heart disease and other diseases of the circulatory system: Secondary | ICD-10-CM

## 2023-01-12 DIAGNOSIS — D125 Benign neoplasm of sigmoid colon: Secondary | ICD-10-CM | POA: Diagnosis present

## 2023-01-12 DIAGNOSIS — K449 Diaphragmatic hernia without obstruction or gangrene: Secondary | ICD-10-CM | POA: Diagnosis present

## 2023-01-12 DIAGNOSIS — Z90710 Acquired absence of both cervix and uterus: Secondary | ICD-10-CM

## 2023-01-12 DIAGNOSIS — Z6835 Body mass index (BMI) 35.0-35.9, adult: Secondary | ICD-10-CM

## 2023-01-12 DIAGNOSIS — M8580 Other specified disorders of bone density and structure, unspecified site: Secondary | ICD-10-CM | POA: Diagnosis present

## 2023-01-12 DIAGNOSIS — K317 Polyp of stomach and duodenum: Secondary | ICD-10-CM

## 2023-01-12 DIAGNOSIS — E876 Hypokalemia: Secondary | ICD-10-CM | POA: Diagnosis not present

## 2023-01-12 LAB — CBC WITH DIFFERENTIAL/PLATELET
Abs Immature Granulocytes: 0.03 10*3/uL (ref 0.00–0.07)
Basophils Absolute: 0.1 10*3/uL (ref 0.0–0.1)
Basophils Relative: 1 %
Eosinophils Absolute: 0.6 10*3/uL — ABNORMAL HIGH (ref 0.0–0.5)
Eosinophils Relative: 9 %
HCT: 24.4 % — ABNORMAL LOW (ref 36.0–46.0)
Hemoglobin: 6.8 g/dL — CL (ref 12.0–15.0)
Immature Granulocytes: 0 %
Lymphocytes Relative: 31 %
Lymphs Abs: 2.1 10*3/uL (ref 0.7–4.0)
MCH: 22.4 pg — ABNORMAL LOW (ref 26.0–34.0)
MCHC: 27.9 g/dL — ABNORMAL LOW (ref 30.0–36.0)
MCV: 80.5 fL (ref 80.0–100.0)
Monocytes Absolute: 0.7 10*3/uL (ref 0.1–1.0)
Monocytes Relative: 10 %
Neutro Abs: 3.3 10*3/uL (ref 1.7–7.7)
Neutrophils Relative %: 49 %
Platelets: 352 10*3/uL (ref 150–400)
RBC: 3.03 MIL/uL — ABNORMAL LOW (ref 3.87–5.11)
RDW: 20.9 % — ABNORMAL HIGH (ref 11.5–15.5)
WBC: 6.8 10*3/uL (ref 4.0–10.5)
nRBC: 0 % (ref 0.0–0.2)

## 2023-01-12 LAB — COMPREHENSIVE METABOLIC PANEL
ALT: 18 U/L (ref 0–44)
AST: 26 U/L (ref 15–41)
Albumin: 2.8 g/dL — ABNORMAL LOW (ref 3.5–5.0)
Alkaline Phosphatase: 89 U/L (ref 38–126)
Anion gap: 7 (ref 5–15)
BUN: 39 mg/dL — ABNORMAL HIGH (ref 8–23)
CO2: 20 mmol/L — ABNORMAL LOW (ref 22–32)
Calcium: 8.3 mg/dL — ABNORMAL LOW (ref 8.9–10.3)
Chloride: 111 mmol/L (ref 98–111)
Creatinine, Ser: 0.92 mg/dL (ref 0.44–1.00)
GFR, Estimated: >60 mL/min (ref >60)
Glucose, Bld: 105 mg/dL — ABNORMAL HIGH (ref 70–99)
Potassium: 4.3 mmol/L (ref 3.5–5.1)
Sodium: 138 mmol/L (ref 135–145)
Total Bilirubin: 0.5 mg/dL (ref 0.3–1.2)
Total Protein: 5.6 g/dL — ABNORMAL LOW (ref 6.5–8.1)

## 2023-01-12 LAB — IRON AND TIBC
Iron: 13 ug/dL — ABNORMAL LOW (ref 28–170)
Saturation Ratios: 3 % — ABNORMAL LOW (ref 10.4–31.8)
TIBC: 441 ug/dL (ref 250–450)
UIBC: 428 ug/dL

## 2023-01-12 LAB — RETICULOCYTES
Immature Retic Fract: 30.8 % — ABNORMAL HIGH (ref 2.3–15.9)
RBC.: 3.05 MIL/uL — ABNORMAL LOW (ref 3.87–5.11)
Retic Count, Absolute: 83.3 10*3/uL (ref 19.0–186.0)
Retic Ct Pct: 2.7 % (ref 0.4–3.1)

## 2023-01-12 LAB — PROTIME-INR
INR: 1.1 (ref 0.8–1.2)
Prothrombin Time: 14.4 s (ref 11.4–15.2)

## 2023-01-12 LAB — FERRITIN: Ferritin: 10 ng/mL — ABNORMAL LOW (ref 11–307)

## 2023-01-12 LAB — VITAMIN B12: Vitamin B-12: 289 pg/mL (ref 180–914)

## 2023-01-12 LAB — ABO/RH: ABO/RH(D): O POS

## 2023-01-12 LAB — FOLATE: Folate: 7.7 ng/mL (ref >5.9)

## 2023-01-12 LAB — PREPARE RBC (CROSSMATCH)

## 2023-01-12 MED ORDER — PANTOPRAZOLE SODIUM 40 MG IV SOLR
40.0000 mg | Freq: Once | INTRAVENOUS | Status: AC
  Administered 2023-01-12: 40 mg via INTRAVENOUS
  Filled 2023-01-12: qty 10

## 2023-01-12 MED ORDER — SODIUM CHLORIDE 0.9% IV SOLUTION
Freq: Once | INTRAVENOUS | Status: AC
Start: 2023-01-12 — End: 2023-01-13

## 2023-01-12 NOTE — ED Provider Triage Note (Signed)
Emergency Medicine Provider Triage Evaluation Note  Debra [name] , a 77 y.o. female  was evaluated in triage.  Pt complains of low blood. Went to PCP yesterday, called today and told hemoccult + (states her stools are always dark) and hgb 7.1.  Hgb 7.1 01/11/23 in Care[facility]where  not on blood thinner.  Review of Systems  Positive:  Negative:   Physical Exam  LMP 07/02/1973  Gen:   Awake, no distress   Resp:  Normal effort  MSK:   Moves extremities without difficulty  Other:  pale  Medical Decision Making  Medically screening exam initiated at 6:13 PM.  Appropriate orders placed.  [name] was informed that the remainder of the evaluation will be completed by another provider, this initial triage assessment does not replace that evaluation, and the importance of remaining in the ED until their evaluation is complete.     Jeannie Fend, PA-C 01/12/23 1816

## 2023-01-12 NOTE — Telephone Encounter (Signed)
Inbound call from [facility] stating that patient was seen today for low hemoglobin and blood in stool. Patient was scheduled for next available with Dr. Marina Goodell 1/22 at 9:40. [facility] is wanting patient to be seen sooner due to a possible GI bleed. Please advise.

## 2023-01-12 NOTE — ED Provider Notes (Signed)
[place] EMERGENCY DEPARTMENT AT [facility] Provider Note   CSN: [id number] Arrival date & time: 01/12/23  1807     History  Chief Complaint  Patient presents with   Weakness   Abnormal Lab    Debra [name] is a 77 y.o. female.   Weakness Abnormal Lab Patient sent in for anemia.  Has had some fatigue.  Saw PCP and found of hemoglobin 7.1.  Patient states she has had dark stool for a while now.  Hemoccult positive at PCP.  States she has felt more fatigued and somewhat lightheaded.  Initially did not want to come to the hospital although she says the doctor said they would call the police on her if she would not come.     Home Medications Prior to Admission medications   Medication Sig Start Date End Date Taking? Authorizing Provider  atorvastatin (LIPITOR) 40 MG tablet TAKE 1 TABLET BY MOUTH EVERY DAY Patient taking differently: Take 40 mg by mouth at bedtime.  01/05/17   Sharon Seller, NP  cephALEXin (KEFLEX) 500 MG capsule Take 1 capsule (500 mg total) by mouth 4 (four) times daily. 09/14/19   Randall Hiss, MD  diphenhydrAMINE (BENADRYL) 25 MG tablet Take 25 mg by mouth at bedtime as needed for itching.    [provider]  ertapenem Pincus Sanes) 1 g injection  09/12/19   [provider]  glucose blood (ACCU-CHEK AVIVA PLUS) test strip Ell.65 check blood sugar twice daily as directed 01/24/16   Reed, Tiffany L, DO  insulin aspart (NOVOLOG) 100 UNIT/ML injection Inject 0-5 Units into the skin at bedtime. 07/31/19   Kirt Boys, MD  insulin aspart (NOVOLOG) 100 UNIT/ML injection Inject 0-20 Units into the skin 3 (three) times daily with meals. 07/31/19   Kirt Boys, MD  insulin glargine (LANTUS) 100 UNIT/ML injection Inject 0.3 mLs (30 Units total) into the skin at bedtime. 07/31/19   Kirt Boys, MD  Insulin Pen Needle (BD PEN NEEDLE NANO U/F) 32G X 4 MM MISC Use once daily with the administration of Toujeo DX E11.65 02/20/16    Reed, Tiffany L, DO  insulin regular (NOVOLIN R) 100 units/mL injection Inject into the skin 3 (three) times daily before meals. As directed    [provider]  metFORMIN (GLUCOPHAGE) 500 MG tablet Take 1 tablet (500 mg total) by mouth daily with breakfast. 07/26/17   Edsel Petrin, DO  metoprolol succinate (TOPROL-XL) 50 MG 24 hr tablet TAKE 1 TABLET BY MOUTH ONCE DAILY 06/08/16   Reed, Tiffany L, DO  naproxen sodium (ALEVE) 220 MG tablet Take 220 mg by mouth daily as needed (pain).     [provider]  omeprazole (PRILOSEC) 40 MG capsule  08/26/19   [provider]  ondansetron (ZOFRAN) 4 MG tablet Take 1 tablet (4 mg total) by mouth every 8 (eight) hours as needed for nausea or vomiting. 08/19/19   Daiva Eves, Lisette Grinder, MD  pantoprazole (PROTONIX) 40 MG tablet Take 1 tablet (40 mg total) by mouth daily. 07/31/19   Kirt Boys, MD  pioglitazone (ACTOS) 30 MG tablet Take 30 mg by mouth daily. 06/22/19   [provider]  polyethylene glycol (MIRALAX / GLYCOLAX) 17 g packet Take 17 g by mouth daily. 07/31/19   Kirt Boys, MD  potassium chloride SA (K-DUR,KLOR-CON) 20 MEQ tablet TAKE 1 TABLET(20 MEQ) BY MOUTH DAILY Patient taking differently: Take 20 mEq by mouth daily.  06/03/16   Reed, Tiffany L, DO  predniSONE (STERAPRED UNI-PAK 21 TAB) 10 MG (21) TBPK tablet Take as directed 07/28/22   Tarry Kos, MD  senna (SENOKOT) 8.6 MG TABS tablet Take 1 tablet (8.6 mg total) by mouth daily. 07/31/19   Kirt Boys, MD  sulfamethoxazole-trimethoprim (BACTRIM DS) 800-160 MG tablet Take 1 tablet by mouth 2 (two) times daily. 09/14/19   Randall Hiss, MD  tirzepatide [facility]) 10 MG/0.5ML Pen Inject 10 mg into the skin once a week. 03/05/22     traMADol (ULTRAM) 50 MG tablet Take 1 tablet (50 mg total) by mouth every 8 (eight) hours as needed for severe pain (pain). 07/31/19   [place], Carley D, DO  valsartan (DIOVAN) 320 MG tablet Take 320 mg by mouth  daily. 03/21/19   [provider]      Allergies    Banana, Hydrocodone, Neomycin-polymyxin-gramicidin, Penicillins, Aspirin, Latex, Metformin and related, and Voltaren [diclofenac sodium]    Review of Systems   Review of Systems  Neurological:  Positive for weakness.    Physical Exam Updated Vital Signs BP (!) 137/50   Pulse 94   Temp 97.9 F (36.6 C) (Oral)   Resp 17   LMP 07/02/1973   SpO2 100%  Physical Exam Vitals and nursing note reviewed.  Cardiovascular:     Rate and Rhythm: Normal rate.  Abdominal:     Tenderness: There is no abdominal tenderness.  Musculoskeletal:        General: No tenderness.  Skin:    General: Skin is warm.  Neurological:     Mental Status: She is alert and oriented to person, place, and time.     ED Results / Procedures / Treatments   Labs (all labs ordered are listed, but only abnormal results are displayed) Labs Reviewed  COMPREHENSIVE METABOLIC PANEL - Abnormal; Notable for the following components:      Result Value   CO2 20 (*)    Glucose, Bld 105 (*)    BUN 39 (*)    Calcium 8.3 (*)    Total Protein 5.6 (*)    Albumin 2.8 (*)    All other components within normal limits  CBC WITH DIFFERENTIAL/PLATELET - Abnormal; Notable for the following components:   RBC 3.03 (*)    Hemoglobin 6.8 (*)    HCT 24.4 (*)    MCH 22.4 (*)    MCHC 27.9 (*)    RDW 20.9 (*)    Eosinophils Absolute 0.6 (*)    All other components within normal limits  IRON AND TIBC - Abnormal; Notable for the following components:   Iron 13 (*)    Saturation Ratios 3 (*)    All other components within normal limits  FERRITIN - Abnormal; Notable for the following components:   Ferritin 10 (*)    All other components within normal limits  RETICULOCYTES - Abnormal; Notable for the following components:   RBC. 3.05 (*)    Immature Retic Fract 30.8 (*)    All other components within normal limits  PROTIME-INR  VITAMIN B12  FOLATE  TYPE AND SCREEN   ABO/RH  PREPARE RBC (CROSSMATCH)    EKG None  Radiology No results found.  Procedures Procedures    Medications Ordered in ED Medications  0.9 %  sodium chloride infusion (Manually program via Guardrails IV Fluids) (has no administration in time range)  pantoprazole (PROTONIX) injection 40 mg (40 mg Intravenous Given 01/12/23 2237)    ED Course/ Medical Decision Making/ A&P  Medical Decision Making  Patient with anemia.  Hemoglobin 6.8.  Vitals reassuring however.  States she feels lightheaded.  Likely has had some bleeding for a while.  Is guaiac positive.  I think would benefit from mission to the hospital along with the transfusion.  Symptomatic  Will discuss with hospitalist.  Will start Protonix.  Appears stable at this time.   CRITICAL CARE Performed by: Benjiman Core Total critical care time: 30 minutes Critical care time was exclusive of separately billable procedures and treating other patients. Critical care was necessary to treat or prevent imminent or life-threatening deterioration. Critical care was time spent personally by me on the following activities: development of treatment plan with patient and/or surrogate as well as nursing, discussions with consultants, evaluation of patient's response to treatment, examination of patient, obtaining history from patient or surrogate, ordering and performing treatments and interventions, ordering and review of laboratory studies, ordering and review of radiographic studies, pulse oximetry and re-evaluation of patient's condition.         Final Clinical Impression(s) / ED Diagnoses Final diagnoses:  Symptomatic anemia  Gastrointestinal hemorrhage, unspecified gastrointestinal hemorrhage type    Rx / DC Orders ED Discharge Orders     None         Benjiman Core, MD 01/12/23 2247

## 2023-01-12 NOTE — ED Triage Notes (Signed)
Patient arrives after receiving results that her hgb is 7.1. Endorses feeling shaky and one episode of palpitations last night.

## 2023-01-13 ENCOUNTER — Encounter (HOSPITAL_COMMUNITY): Payer: Self-pay | Admitting: Internal Medicine

## 2023-01-13 DIAGNOSIS — R195 Other fecal abnormalities: Secondary | ICD-10-CM

## 2023-01-13 DIAGNOSIS — E785 Hyperlipidemia, unspecified: Secondary | ICD-10-CM

## 2023-01-13 DIAGNOSIS — I10 Essential (primary) hypertension: Secondary | ICD-10-CM

## 2023-01-13 DIAGNOSIS — K922 Gastrointestinal hemorrhage, unspecified: Secondary | ICD-10-CM | POA: Diagnosis present

## 2023-01-13 DIAGNOSIS — K648 Other hemorrhoids: Secondary | ICD-10-CM | POA: Diagnosis present

## 2023-01-13 DIAGNOSIS — E876 Hypokalemia: Secondary | ICD-10-CM | POA: Diagnosis not present

## 2023-01-13 DIAGNOSIS — K317 Polyp of stomach and duodenum: Secondary | ICD-10-CM | POA: Diagnosis present

## 2023-01-13 DIAGNOSIS — Z791 Long term (current) use of non-steroidal anti-inflammatories (NSAID): Secondary | ICD-10-CM | POA: Diagnosis not present

## 2023-01-13 DIAGNOSIS — Z7985 Long-term (current) use of injectable non-insulin antidiabetic drugs: Secondary | ICD-10-CM | POA: Diagnosis not present

## 2023-01-13 DIAGNOSIS — E119 Type 2 diabetes mellitus without complications: Secondary | ICD-10-CM

## 2023-01-13 DIAGNOSIS — M1990 Unspecified osteoarthritis, unspecified site: Secondary | ICD-10-CM | POA: Diagnosis present

## 2023-01-13 DIAGNOSIS — E66812 Obesity, class 2: Secondary | ICD-10-CM | POA: Diagnosis present

## 2023-01-13 DIAGNOSIS — K254 Chronic or unspecified gastric ulcer with hemorrhage: Secondary | ICD-10-CM | POA: Diagnosis present

## 2023-01-13 DIAGNOSIS — D509 Iron deficiency anemia, unspecified: Secondary | ICD-10-CM

## 2023-01-13 DIAGNOSIS — E1149 Type 2 diabetes mellitus with other diabetic neurological complication: Secondary | ICD-10-CM | POA: Diagnosis present

## 2023-01-13 DIAGNOSIS — Z9049 Acquired absence of other specified parts of digestive tract: Secondary | ICD-10-CM | POA: Diagnosis not present

## 2023-01-13 DIAGNOSIS — M8580 Other specified disorders of bone density and structure, unspecified site: Secondary | ICD-10-CM | POA: Diagnosis present

## 2023-01-13 DIAGNOSIS — Z794 Long term (current) use of insulin: Secondary | ICD-10-CM | POA: Diagnosis not present

## 2023-01-13 DIAGNOSIS — I872 Venous insufficiency (chronic) (peripheral): Secondary | ICD-10-CM | POA: Diagnosis present

## 2023-01-13 DIAGNOSIS — D62 Acute posthemorrhagic anemia: Secondary | ICD-10-CM | POA: Diagnosis present

## 2023-01-13 DIAGNOSIS — I890 Lymphedema, not elsewhere classified: Secondary | ICD-10-CM | POA: Diagnosis present

## 2023-01-13 DIAGNOSIS — K449 Diaphragmatic hernia without obstruction or gangrene: Secondary | ICD-10-CM | POA: Diagnosis present

## 2023-01-13 DIAGNOSIS — D649 Anemia, unspecified: Principal | ICD-10-CM

## 2023-01-13 DIAGNOSIS — Z6835 Body mass index (BMI) 35.0-35.9, adult: Secondary | ICD-10-CM | POA: Diagnosis not present

## 2023-01-13 DIAGNOSIS — E1151 Type 2 diabetes mellitus with diabetic peripheral angiopathy without gangrene: Secondary | ICD-10-CM | POA: Diagnosis present

## 2023-01-13 DIAGNOSIS — K5730 Diverticulosis of large intestine without perforation or abscess without bleeding: Secondary | ICD-10-CM | POA: Diagnosis present

## 2023-01-13 DIAGNOSIS — D125 Benign neoplasm of sigmoid colon: Secondary | ICD-10-CM | POA: Diagnosis present

## 2023-01-13 DIAGNOSIS — Z90710 Acquired absence of both cervix and uterus: Secondary | ICD-10-CM | POA: Diagnosis not present

## 2023-01-13 LAB — BPAM RBC
Blood Product Expiration Date: 202411182359
ISSUE DATE / TIME: 202410222245
Unit Type and Rh: 5100

## 2023-01-13 LAB — CBC
HCT: 26.4 % — ABNORMAL LOW (ref 36.0–46.0)
Hemoglobin: 7.7 g/dL — ABNORMAL LOW (ref 12.0–15.0)
MCH: 23.4 pg — ABNORMAL LOW (ref 26.0–34.0)
MCHC: 29.2 g/dL — ABNORMAL LOW (ref 30.0–36.0)
MCV: 80.2 fL (ref 80.0–100.0)
Platelets: 313 10*3/uL (ref 150–400)
RBC: 3.29 MIL/uL — ABNORMAL LOW (ref 3.87–5.11)
RDW: 19.8 % — ABNORMAL HIGH (ref 11.5–15.5)
WBC: 6.7 10*3/uL (ref 4.0–10.5)
nRBC: 0 % (ref 0.0–0.2)

## 2023-01-13 LAB — BASIC METABOLIC PANEL
Anion gap: 7 (ref 5–15)
BUN: 33 mg/dL — ABNORMAL HIGH (ref 8–23)
CO2: 21 mmol/L — ABNORMAL LOW (ref 22–32)
Calcium: 8.3 mg/dL — ABNORMAL LOW (ref 8.9–10.3)
Chloride: 111 mmol/L (ref 98–111)
Creatinine, Ser: 0.83 mg/dL (ref 0.44–1.00)
GFR, Estimated: >60 mL/min (ref >60)
Glucose, Bld: 91 mg/dL (ref 70–99)
Potassium: 3.8 mmol/L (ref 3.5–5.1)
Sodium: 139 mmol/L (ref 135–145)

## 2023-01-13 LAB — TYPE AND SCREEN
ABO/RH(D): O POS
Antibody Screen: NEGATIVE
Unit division: 0

## 2023-01-13 LAB — GLUCOSE, CAPILLARY: Glucose-Capillary: 99 mg/dL (ref 70–99)

## 2023-01-13 MED ORDER — SODIUM CHLORIDE 0.9 % IV SOLN
100.0000 mg | Freq: Once | INTRAVENOUS | Status: AC
  Administered 2023-01-13: 100 mg via INTRAVENOUS
  Filled 2023-01-13: qty 5

## 2023-01-13 MED ORDER — PANTOPRAZOLE SODIUM 40 MG IV SOLR
40.0000 mg | INTRAVENOUS | Status: DC
  Administered 2023-01-13 – 2023-01-15 (×3): 40 mg via INTRAVENOUS
  Filled 2023-01-13 (×3): qty 10

## 2023-01-13 MED ORDER — PEG-KCL-NACL-NASULF-NA ASC-C 100 G PO SOLR: 1.0000 | Freq: Once | ORAL | Status: DC

## 2023-01-13 MED ORDER — ATORVASTATIN CALCIUM 40 MG PO TABS
40.0000 mg | ORAL_TABLET | Freq: Every day | ORAL | Status: DC
  Administered 2023-01-13 – 2023-01-14 (×2): 40 mg via ORAL
  Filled 2023-01-13 (×2): qty 1

## 2023-01-13 MED ORDER — FUROSEMIDE 10 MG/ML IJ SOLN
40.0000 mg | Freq: Two times a day (BID) | INTRAMUSCULAR | Status: AC
  Administered 2023-01-13 – 2023-01-14 (×3): 40 mg via INTRAVENOUS
  Filled 2023-01-13 (×3): qty 4

## 2023-01-13 MED ORDER — PEG-KCL-NACL-NASULF-NA ASC-C 100 G PO SOLR
0.5000 | Freq: Once | ORAL | Status: AC
  Administered 2023-01-13: 100 g via ORAL

## 2023-01-13 MED ORDER — FERROUS SULFATE 325 (65 FE) MG PO TABS: 325.0000 mg | ORAL_TABLET | Freq: Every day | ORAL | Status: DC

## 2023-01-13 MED ORDER — PEG-KCL-NACL-NASULF-NA ASC-C 100 G PO SOLR
0.5000 | Freq: Once | ORAL | Status: AC
  Administered 2023-01-13: 100 g via ORAL
  Filled 2023-01-13: qty 1

## 2023-01-13 MED ORDER — ONDANSETRON HCL 4 MG/2ML IJ SOLN
4.0000 mg | Freq: Four times a day (QID) | INTRAMUSCULAR | Status: DC | PRN
  Administered 2023-01-13: 4 mg via INTRAVENOUS
  Filled 2023-01-13 (×2): qty 2

## 2023-01-13 MED ORDER — SODIUM CHLORIDE 0.9 % IV SOLN: INTRAVENOUS | Status: AC

## 2023-01-13 MED ORDER — IRON SUCROSE 200 MG IVPB - SIMPLE MED: 200.0000 mg | Freq: Once | Status: DC

## 2023-01-13 NOTE — Assessment & Plan Note (Signed)
continue statin

## 2023-01-13 NOTE — Progress Notes (Signed)
Patient taking her prep for her colonoscopy. Pt c/o nausea. Dr. Julian Reil made aware

## 2023-01-13 NOTE — ED Notes (Signed)
ED TO INPATIENT HANDOFF REPORT  ED Nurse Name and Phone #: Pal Shell [phone]  S Name/Age/Gender Debra [name] 77 y.o. female Room/Bed: 037C/037C  Code Status   Code Status: Full Code  Home/SNF/Other Home Patient oriented to: self, place, time, and situation Is this baseline? Yes   Triage Complete: Triage complete  Chief Complaint Acute GI bleeding [K92.2] Upper GI bleed [K92.2]  Triage Note Patient arrives after receiving results that her hgb is 7.1. Endorses feeling shaky and one episode of palpitations last night.    Allergies Allergies  Allergen Reactions   Tramadol Nausea And Vomiting    Hives   Banana Hives   Hydrocodone Hives   Neomycin-Polymyxin-Gramicidin Itching and Swelling    Eye drops caused swelling in face and rash and itching on arms and neck   Penicillins Hives and Itching    She has tolerated keflex Has patient had a PCN reaction causing immediate rash, facial/tongue/throat swelling, SOB or lightheadedness with hypotension: Yes Has patient had a PCN reaction causing severe rash involving mucus membranes or skin necrosis: No Has patient had a PCN reaction that required hospitalization: NO Has patient had a PCN reaction occurring within the last 10 years:NO     Aspirin Nausea Only    And causes bruises   Latex Hives and Rash   Metformin And Related Diarrhea   Voltaren [Diclofenac Sodium] Nausea And Vomiting    Level of Care/Admitting Diagnosis ED Disposition     ED Disposition  Admit   Condition  --   Comment  H[facility]: [facility] [[id number]]  Level of Care: Telemetry Medical [104]  May admit patient to [facility] or [facility] if equivalent level of care is available:: No  Covid Evaluation: Confirmed COVID Negative  Diagnosis: Upper GI bleed [267195]  Admitting Physician: Burnadette Pop [[id number]]  Attending Physician: Burnadette Pop [[id number]]  Certification:: I certify this patient will need inpatient services for at  least 2 midnights          B Medical/Surgery History Past Medical History:  Diagnosis Date   Acute osteomyelitis of calcaneum, left (HCC) 07/10/2019   Cellulitis of left leg    history of, most recent episode 01/09   Chronic venous insufficiency    Diabetes mellitus with neurological manifestation (HCC)    Diabetic foot infection (HCC) 07/10/2019   Diverticulitis    h/o 2-3 episodes in past   Group B streptococcal infection 07/10/2019   Hyperlipidemia LDL goal < 100    Hypertension goal BP (blood pressure) < 130/80    Leg edema    secondary to chronic venous insufficiency   Morbid obesity (HCC)    Osteopenia    DEXA scan 7/07   Pseudomonas aeruginosa infection 07/10/2019   Past Surgical History:  Procedure Laterality Date   ABDOMINAL HYSTERECTOMY     APPENDECTOMY     CHOLECYSTECTOMY     CHOLECYSTECTOMY N/A 04/14/2013   Procedure: LAPAROSCOPIC CHOLECYSTECTOMY WITH INTRAOPERATIVE CHOLANGIOGRAM;  Surgeon: Ernestene Mention, MD;  Location: [facility];  Service: General;  Laterality: N/A;   COLECTOMY     with diverting colsotmy and revision in the 1990's for diverticulitis   ERCP N/A 04/17/2013   Procedure: ENDOSCOPIC RETROGRADE CHOLANGIOPANCREATOGRAPHY (ERCP);  Surgeon: Hilarie Fredrickson, MD;  Location: [facility];  Service: Endoscopy;  Laterality: N/A;  note pt want general anesthesia for this case.  preferto perform in endo unit, but if unavailable please arrange time in OR   EYE SURGERY  8/13  left cataract removal & retinal repair   I & D EXTREMITY Left 07/26/2019   Procedure: LEFT PARTIAL CALCANEOUS EXCISION;  Surgeon: Nadara Mustard, MD;  Location: [facility] OR;  Service: Orthopedics;  Laterality: Left;   INSERTION OF MESH N/A 07/30/2017   Procedure: INSERTION OF MESH;  Surgeon: Griselda Miner, MD;  Location: [facility];  Service: General;  Laterality: N/A;   LAPAROSCOPIC LYSIS OF ADHESIONS N/A 04/14/2013   Procedure: LAPAROSCOPIC LYSIS OF ADHESIONS;  Surgeon: Ernestene Mention, MD;  Location: [facility];  Service: General;  Laterality: N/A;   LAPAROTOMY N/A 07/30/2017   Procedure: EXPLORATORY LAPAROTOMY;  Surgeon: Griselda Miner, MD;  Location: [facility];  Service: General;  Laterality: N/A;   LYSIS OF ADHESION N/A 07/30/2017   Procedure: EXTENSIVE LYSIS OF ADHESION;  Surgeon: Griselda Miner, MD;  Location: [facility];  Service: General;  Laterality: N/A;   OMENTECTOMY N/A 07/30/2017   Procedure: PARTIAL OMENTECTOMY;  Surgeon: Griselda Miner, MD;  Location: [facility];  Service: General;  Laterality: N/A;   VENTRAL HERNIA REPAIR N/A 07/30/2017   Procedure: HERNIA REPAIR VENTRAL ADULT;  Surgeon: Griselda Miner, MD;  Location: [facility];  Service: General;  Laterality: N/A;     A IV Location/Drains/Wounds Patient Lines/Drains/Airways Status     Active Line/Drains/Airways     Name Placement date Placement time Site Days   Peripheral IV 01/12/23 20 G Left Antecubital 01/12/23  2154  Antecubital  1   Peripheral IV 01/13/23 22 G 1.75" Anterior;Left Forearm 01/13/23  0133  Forearm  less than 1   Closed System Drain 1 Left LLQ Bulb (JP) 19 Fr. 07/30/17  1420  LLQ  1993   Negative Pressure Wound Therapy Foot Left 07/26/19  1224  --  1267   Incision 04/14/13 Abdomen Other (Comment) 04/14/13  1323  -- 3561   Incision (Closed) 07/30/17 Abdomen Other (Comment) 07/30/17  1429  -- 1993   Incision (Closed) 11/17/17 Abdomen Right;Lower 11/17/17  0835  -- 1883   Incision (Closed) 07/26/19 Foot Left 07/26/19  1223  -- 1267   Incision - 1 Port Abdomen 1: Left;Mid;Lateral 04/14/13  1222  -- 3561   Incision - 6 Ports Abdomen 1: Right;Superior 2: Right;Lateral 3: Umbilicus Medial;Superior 5: Left;Superior 6: Left;Proximal;Mid 04/14/13  1219  -- 3561   Wound / Incision (Open or Dehisced) 07/27/17 Venous stasis ulcer Leg Right;Distal reddened open areas, some scabbed 07/27/17  1410  Leg  1996   Wound / Incision (Open or Dehisced) 07/22/19 07/22/19  --  --  1271            Intake/Output Last 24 hours  Intake/Output  Summary (Last 24 hours) at 01/13/2023 1100 Last data filed at 01/13/2023 0317 Gross per 24 hour  Intake 9.17 ml  Output --  Net 9.17 ml    Labs/Imaging Results for orders placed or performed during the hospital encounter of 01/12/23 (from the past 48 hour(s))  Comprehensive metabolic panel     Status: Abnormal   Collection Time: 01/12/23  6:28 PM  Result Value Ref Range   Sodium 138 135 - 145 mmol/L   Potassium 4.3 3.5 - 5.1 mmol/L   Chloride 111 98 - 111 mmol/L   CO2 20 (L) 22 - 32 mmol/L   Glucose, Bld 105 (H) 70 - 99 mg/dL    Comment: Glucose reference range applies only to samples taken after fasting for at least 8 hours.   BUN 39 (H) 8 - 23  mg/dL   Creatinine, Ser 4.01 0.44 - 1.00 mg/dL   Calcium 8.3 (L) 8.9 - 10.3 mg/dL   Total Protein 5.6 (L) 6.5 - 8.1 g/dL   Albumin 2.8 (L) 3.5 - 5.0 g/dL   AST 26 15 - 41 U/L   ALT 18 0 - 44 U/L   Alkaline Phosphatase 89 38 - 126 U/L   Total Bilirubin 0.5 0.3 - 1.2 mg/dL   GFR, Estimated >02 >72 mL/min    Comment: (NOTE) Calculated using the CKD-EPI Creatinine Equation (2021)    Anion gap 7 5 - 15    Comment: Performed at [facility], [address] [place], [place] [address]  CBC with Differential     Status: Abnormal   Collection Time: 01/12/23  6:28 PM  Result Value Ref Range   WBC 6.8 4.0 - 10.5 K/uL   RBC 3.03 (L) 3.87 - 5.11 MIL/uL   Hemoglobin 6.8 (LL) 12.0 - 15.0 g/dL    Comment: This critical result has verified and been called to I JEREMY,RN by Doran Durand on 10 22 2024 at 1914, and has been read back.  REPEATED TO VERIFY CORRECTED ON 10/22 AT 2035: PREVIOUSLY REPORTED AS 6.8 This critical result has verified and been called to I JEREMY,RN by Doran Durand on 10 22 2024 at 1914, and has been read back.     HCT 24.4 (L) 36.0 - 46.0 %   MCV 80.5 80.0 - 100.0 fL   MCH 22.4 (L) 26.0 - 34.0 pg   MCHC 27.9 (L) 30.0 - 36.0 g/dL   RDW 40.3 (H) 47.4 - 25.9 %   Platelets 352 150 - 400 K/uL   nRBC 0.0 0.0 - 0.2 %    Neutrophils Relative % 49 %   Neutro Abs 3.3 1.7 - 7.7 K/uL   Lymphocytes Relative 31 %   Lymphs Abs 2.1 0.7 - 4.0 K/uL   Monocytes Relative 10 %   Monocytes Absolute 0.7 0.1 - 1.0 K/uL   Eosinophils Relative 9 %   Eosinophils Absolute 0.6 (H) 0.0 - 0.5 K/uL   Basophils Relative 1 %   Basophils Absolute 0.1 0.0 - 0.1 K/uL   Immature Granulocytes 0 %   Abs Immature Granulocytes 0.03 0.00 - 0.07 K/uL    Comment: Performed at [facility], [address] [place], [place] [address]  Protime-INR     Status: None   Collection Time: 01/12/23  6:28 PM  Result Value Ref Range   Prothrombin Time 14.4 11.4 - 15.2 seconds   INR 1.1 0.8 - 1.2    Comment: (NOTE) INR goal varies based on device and disease states. Performed at [facility], [address] [place], [place] [address]   Type and screen [facility]     Status: None   Collection Time: 01/12/23  6:28 PM  Result Value Ref Range   ABO/RH(D) O POS    Antibody Screen NEG    Sample Expiration 01/15/2023,2359    Unit Number [id number]    Blood Component Type RED CELLS,LR    Unit division 00    Status of Unit ISSUED,FINAL    Transfusion Status OK TO TRANSFUSE    Crossmatch Result      Compatible Performed at [facility], [address] [place], [place] [address]   Vitamin B12     Status: None   Collection Time: 01/12/23  6:28 PM  Result Value Ref Range   Vitamin  B-12 289 180 - 914 pg/mL    Comment: (NOTE) This assay is not validated for testing neonatal or myeloproliferative syndrome specimens for Vitamin B12 levels. Performed at [facility], [address], [place], [place] [address]   Folate     Status: None   Collection Time: 01/12/23  6:28 PM  Result Value Ref Range   Folate 7.7 >5.9 ng/mL    Comment: Performed at [facility], [address] [place], [place] [address]  Iron and TIBC     Status: Abnormal   Collection Time: 01/12/23  6:28 PM  Result Value Ref Range    Iron 13 (L) 28 - 170 ug/dL   TIBC 518 841 - 660 ug/dL   Saturation Ratios 3 (L) 10.4 - 31.8 %   UIBC 428 ug/dL    Comment: Performed at [facility], [address] [place], [place] [address]  Ferritin     Status: Abnormal   Collection Time: 01/12/23  6:28 PM  Result Value Ref Range   Ferritin 10 (L) 11 - 307 ng/mL    Comment: Performed at [facility], [address] [place], [place] [address]  Reticulocytes     Status: Abnormal   Collection Time: 01/12/23  6:28 PM  Result Value Ref Range   Retic Ct Pct 2.7 0.4 - 3.1 %   RBC. 3.05 (L) 3.87 - 5.11 MIL/uL   Retic Count, Absolute 83.3 19.0 - 186.0 K/uL   Immature Retic Fract 30.8 (H) 2.3 - 15.9 %    Comment: Performed at [facility], [address], [place], [place] [address]  ABO/Rh     Status: None   Collection Time: 01/12/23  9:09 PM  Result Value Ref Range   ABO/RH(D)      O POS Performed at [facility], [address], [place], [place] [address]   Prepare RBC (crossmatch)     Status: None   Collection Time: 01/12/23 10:23 PM  Result Value Ref Range   Order Confirmation      ORDER PROCESSED BY BLOOD BANK Performed at [facility], [address] [place], [place] [address]   CBC     Status: Abnormal   Collection Time: 01/13/23  4:20 AM  Result Value Ref Range   WBC 6.7 4.0 - 10.5 K/uL   RBC 3.29 (L) 3.87 - 5.11 MIL/uL   Hemoglobin 7.7 (L) 12.0 - 15.0 g/dL   HCT 62.3 (L) 76.2 - 83.1 %   MCV 80.2 80.0 - 100.0 fL   MCH 23.4 (L) 26.0 - 34.0 pg   MCHC 29.2 (L) 30.0 - 36.0 g/dL   RDW 51.7 (H) 61.6 - 07.3 %   Platelets 313 150 - 400 K/uL   nRBC 0.0 0.0 - 0.2 %    Comment: Performed at [facility], [address] [place], [place] [address]  Basic metabolic panel     Status: Abnormal   Collection Time: 01/13/23  4:20 AM  Result Value Ref Range   Sodium 139 135 - 145 mmol/L   Potassium 3.8 3.5 - 5.1 mmol/L   Chloride 111 98 - 111 mmol/L   CO2 21 (L) 22 - 32 mmol/L   Glucose,  Bld 91 70 - 99 mg/dL    Comment: Glucose reference range applies only to samples taken after fasting for at least 8 hours.   BUN 33 (H) 8 - 23 mg/dL   Creatinine, Ser  0.83 0.44 - 1.00 mg/dL   Calcium 8.3 (L) 8.9 - 10.3 mg/dL   GFR, Estimated >26 >94 mL/min    Comment: (NOTE) Calculated using the CKD-EPI Creatinine Equation (2021)    Anion gap 7 5 - 15    Comment: Performed at [facility], [address], [place], [place] [address]   No results found.  Pending Labs Unresulted Labs (From admission, onward)     Start     Ordered   01/14/23 0500  CBC  Tomorrow morning,   R        01/13/23 0847            Vitals/Pain Today's Vitals   01/13/23 0900 01/13/23 0930 01/13/23 0952 01/13/23 1054  BP: (!) 156/80 135/64    Pulse: 95 82    Resp: (!) 27 18    Temp:    97.8 F (36.6 C)  TempSrc:    Oral  SpO2: 100% 97%    PainSc:   0-No pain     Isolation Precautions No active isolations  Medications Medications  atorvastatin (LIPITOR) tablet 40 mg (has no administration in time range)  pantoprazole (PROTONIX) injection 40 mg (40 mg Intravenous Given 01/13/23 0944)  ferrous sulfate tablet 325 mg (has no administration in time range)  peg 3350 powder (MOVIPREP) kit 100 g (has no administration in time range)    And  peg 3350 powder (MOVIPREP) kit 100 g (has no administration in time range)  pantoprazole (PROTONIX) injection 40 mg (40 mg Intravenous Given 01/12/23 2237)  0.9 %  sodium chloride infusion (Manually program via Guardrails IV Fluids) (0 mLs Intravenous Stopped 01/13/23 0317)  iron sucrose (VENOFER) 100 mg in sodium chloride 0.9 % 100 mL IVPB (0 mg Intravenous Stopped 01/13/23 0244)    Mobility Standby assist      Focused Assessments Cardiac Assessment Handoff:  Cardiac Rhythm: Normal sinus rhythm Lab Results  Component Value Date   TROPONINI 0.03 (HH) 08/02/2017   No results found for: "DDIMER" Does the Patient currently have chest pain? No     R Recommendations: See Admitting Provider Note  Report given to:   Additional Notes:

## 2023-01-13 NOTE — Assessment & Plan Note (Signed)
-  holding antihypertensive due to acute blood loss anemia

## 2023-01-13 NOTE — Progress Notes (Signed)
Brief same day note:  Patient is a 77 year old female with history of hypertension, venous insufficiency, type 2 diabetes, SBO who presented for the evaluation of low hemoglobin.  She recently had outpatient blood work which showed hemoglobin of 7.1, FOBT positive.  No report of melena or rectal bleeding or abdominal pain.  Takes Aleve for knee pain.  Patient history of weight loss and was taking Mounjaro.  On presentation, hemoglobin found to be 6.8, given unit of blood transfusion.  Started on PPI for the suspicion for upper GI bleed.  GI consulted.  Plan for EGD/consult tomorrow   Assessment and plan:   Upper GI bleed: No report of hematochezia or melena but hemoglobin low when checked at PCPs office.  Presented with hemoglobin of 6.8.  FOBT positive when checked as an outpatient.  Takes Aleve chronically for last few years for knee pain.  Rule out upper GI bleed.  Started on Protonix IV.  GI consulted.  Plan for EGD/colonoscopy tomorrow.   Acute blood loss anemia/Iron deficiency: Hemoglobin of 6.8 on presentation, given unit of PRBC.  Monitor.  Hemoglobin of 7.7 this morning. Iron of just 13.  Given her IV iron and start on oral iron supplementation.   Type 2 diabetes: A1c of 5.4.  On weekly Mounjaro.   Hyperlipidemia: On statin   Hypertension: Currently normotensive.  Home medications on hold  Bilateral lower extremity lymphedema: Follows with Dr. Lajoyce Corners.  Bilateral lower extremities are significantly  edematous.  Likely chronic lymphedema.  She says she takes fluid pill at home but not seen on med rec.  Will start on IV Lasix

## 2023-01-13 NOTE — Plan of Care (Signed)

## 2023-01-13 NOTE — Consult Note (Addendum)
Consultation  Referring Provider:    Dr. Renford Dills  Primary Care Physician:  Julien Girt, PA-C Primary Gastroenterologist: Dr.  Marina Goodell        Reason for Consultation: Anemia with heme positive stools             HPI:   Debra [name] is a 77 y.o. female with a past medical history as listed below including venous insufficiency, type 2 diabetes, SBO and multiple others who presented to the ER on 01/12/2023 for acute anemia.    Apparently patient saw PCP on 10/21 for follow-up and had labs with a hemoglobin of 7.1 and was guaiac positive.  Initially refused to come to the ED but then came.  Described intermittent diarrhea.  Chronically on Aleve for knee pain.  Apparently 3 pills for the past 4 to 5 years.  Also discussed an 18 pound weight loss but attributed it to [facility] for the past 2 years.    Today, patient tells me that she has been feeling perfectly fine.  No complaints at all, she was urged to come to the ER by Willette Cluster who told her that if she did not, she would come and get her.  This is the only reason she is here now.  Tells me she has had normal bowel movements and has not seen any blood in her stool and in fact they have been kind of yellow which is normal for her.  Denies any shortness of breath, palpitations or abdominal pain.  Does admit to using Aleve 3 capsules daily for the past 3 years for arthritic pain.    Denies fever, chills or weight loss.  ER course: Normotensive, repeat hemoglobin of 6.8 (remote baseline around 10-11), creatinine 0.92, BUN 39, given a PPI and started on a PRBC transfusion  Past Medical History:  Diagnosis Date   Acute osteomyelitis of calcaneum, left (HCC) 07/10/2019   Cellulitis of left leg    history of, most recent episode 01/09   Chronic venous insufficiency    Diabetes mellitus with neurological manifestation (HCC)    Diabetic foot infection (HCC) 07/10/2019   Diverticulitis    h/o 2-3 episodes in past   Group B streptococcal  infection 07/10/2019   Hyperlipidemia LDL goal < 100    Hypertension goal BP (blood pressure) < 130/80    Leg edema    secondary to chronic venous insufficiency   Morbid obesity (HCC)    Osteopenia    DEXA scan 7/07   Pseudomonas aeruginosa infection 07/10/2019    Past Surgical History:  Procedure Laterality Date   ABDOMINAL HYSTERECTOMY     APPENDECTOMY     CHOLECYSTECTOMY     CHOLECYSTECTOMY N/A 04/14/2013   Procedure: LAPAROSCOPIC CHOLECYSTECTOMY WITH INTRAOPERATIVE CHOLANGIOGRAM;  Surgeon: Ernestene Mention, MD;  Location: [facility];  Service: General;  Laterality: N/A;   COLECTOMY     with diverting colsotmy and revision in the 1990's for diverticulitis   ERCP N/A 04/17/2013   Procedure: ENDOSCOPIC RETROGRADE CHOLANGIOPANCREATOGRAPHY (ERCP);  Surgeon: Hilarie Fredrickson, MD;  Location: [facility];  Service: Endoscopy;  Laterality: N/A;  note pt want general anesthesia for this case.  preferto perform in endo unit, but if unavailable please arrange time in OR   EYE SURGERY  8/13   left cataract removal & retinal repair   I & D EXTREMITY Left 07/26/2019   Procedure: LEFT PARTIAL CALCANEOUS EXCISION;  Surgeon: Nadara Mustard, MD;  Location: [facility] OR;  Service: Orthopedics;  Laterality: Left;   INSERTION OF MESH N/A 07/30/2017   Procedure: INSERTION OF MESH;  Surgeon: Griselda Miner, MD;  Location: [facility];  Service: General;  Laterality: N/A;   LAPAROSCOPIC LYSIS OF ADHESIONS N/A 04/14/2013   Procedure: LAPAROSCOPIC LYSIS OF ADHESIONS;  Surgeon: Ernestene Mention, MD;  Location: [facility];  Service: General;  Laterality: N/A;   LAPAROTOMY N/A 07/30/2017   Procedure: EXPLORATORY LAPAROTOMY;  Surgeon: Griselda Miner, MD;  Location: [facility];  Service: General;  Laterality: N/A;   LYSIS OF ADHESION N/A 07/30/2017   Procedure: EXTENSIVE LYSIS OF ADHESION;  Surgeon: Griselda Miner, MD;  Location: [facility];  Service: General;  Laterality: N/A;   OMENTECTOMY N/A 07/30/2017   Procedure: PARTIAL OMENTECTOMY;  Surgeon:  Griselda Miner, MD;  Location: [facility];  Service: General;  Laterality: N/A;   VENTRAL HERNIA REPAIR N/A 07/30/2017   Procedure: HERNIA REPAIR VENTRAL ADULT;  Surgeon: Griselda Miner, MD;  Location: [facility];  Service: General;  Laterality: N/A;    Family History  Problem Relation Age of Onset   Heart disease Mother    Heart disease Father    Heart disease Brother    Cancer Brother    Cancer Brother    Hypertension Sister    Heart disease Sister     Social History   Tobacco Use   Smoking status: Never   Smokeless tobacco: Never  Vaping Use   Vaping status: Never Used  Substance Use Topics   Alcohol use: No   Drug use: No    Prior to Admission medications   Medication Sig Start Date End Date Taking? Authorizing Provider  atorvastatin (LIPITOR) 40 MG tablet TAKE 1 TABLET BY MOUTH EVERY DAY Patient taking differently: Take 40 mg by mouth at bedtime. 01/05/17  Yes Sharon Seller, NP  diphenhydrAMINE (BENADRYL) 25 MG tablet Take 25 mg by mouth at bedtime as needed for itching.   Yes [provider]  naproxen sodium (ALEVE) 220 MG tablet Take 220 mg by mouth daily as needed (pain).    Yes [provider]  omeprazole (PRILOSEC) 40 MG capsule Take 40 mg by mouth daily. 08/26/19  Yes [provider]  ondansetron (ZOFRAN) 4 MG tablet Take 1 tablet (4 mg total) by mouth every 8 (eight) hours as needed for nausea or vomiting. 08/19/19  Yes Daiva Eves, Lisette Grinder, MD  pioglitazone (ACTOS) 30 MG tablet Take 30 mg by mouth daily. 06/22/19  Yes [provider]  potassium chloride SA (K-DUR,KLOR-CON) 20 MEQ tablet TAKE 1 TABLET(20 MEQ) BY MOUTH DAILY Patient taking differently: Take 20 mEq by mouth daily. 06/03/16  Yes Reed, Tiffany L, DO  tirzepatide [facility]) 10 MG/0.5ML Pen Inject 10 mg into the skin once a week. 03/05/22  Yes   valsartan (DIOVAN) 320 MG tablet Take 320 mg by mouth daily. 03/21/19  Yes [provider]  glucose blood (ACCU-CHEK AVIVA  PLUS) test strip Ell.65 check blood sugar twice daily as directed 01/24/16   Reed, Tiffany L, DO  Insulin Pen Needle (BD PEN NEEDLE NANO U/F) 32G X 4 MM MISC Use once daily with the administration of Toujeo DX E11.65 02/20/16   Reed, Tiffany L, DO    Current Facility-Administered Medications  Medication Dose Route Frequency Provider Last Rate Last Admin   atorvastatin (LIPITOR) tablet 40 mg  40 mg Oral QHS Tu, Ching T, DO       pantoprazole (PROTONIX) injection 40 mg  40 mg Intravenous Q24H  Anselm Jungling, DO       Current Outpatient Medications  Medication Sig Dispense Refill   atorvastatin (LIPITOR) 40 MG tablet TAKE 1 TABLET BY MOUTH EVERY DAY (Patient taking differently: Take 40 mg by mouth at bedtime.) 90 tablet 1   diphenhydrAMINE (BENADRYL) 25 MG tablet Take 25 mg by mouth at bedtime as needed for itching.     naproxen sodium (ALEVE) 220 MG tablet Take 220 mg by mouth daily as needed (pain).      omeprazole (PRILOSEC) 40 MG capsule Take 40 mg by mouth daily.     ondansetron (ZOFRAN) 4 MG tablet Take 1 tablet (4 mg total) by mouth every 8 (eight) hours as needed for nausea or vomiting. 60 tablet 0   pioglitazone (ACTOS) 30 MG tablet Take 30 mg by mouth daily.     potassium chloride SA (K-DUR,KLOR-CON) 20 MEQ tablet TAKE 1 TABLET(20 MEQ) BY MOUTH DAILY (Patient taking differently: Take 20 mEq by mouth daily.) 30 tablet 5   tirzepatide (MOUNJARO) 10 MG/0.5ML Pen Inject 10 mg into the skin once a week. 6 mL 2   valsartan (DIOVAN) 320 MG tablet Take 320 mg by mouth daily.     glucose blood (ACCU-CHEK AVIVA PLUS) test strip Ell.65 check blood sugar twice daily as directed 100 each 11   Insulin Pen Needle (BD PEN NEEDLE NANO U/F) 32G X 4 MM MISC Use once daily with the administration of Toujeo DX E11.65 100 each 11    Allergies as of 01/12/2023 - Review Complete 01/12/2023  Allergen Reaction Noted   Banana Hives 12/30/2011   Hydrocodone Hives    Neomycin-polymyxin-gramicidin Itching and  Swelling 03/18/2012   Penicillins Hives and Itching    Aspirin Nausea Only    Latex Hives and Rash    Metformin and related Diarrhea 12/22/2018   Voltaren [diclofenac sodium] Nausea And Vomiting 02/27/2016     Review of Systems:    Constitutional: No weight loss, fever or chills Skin: No rash Cardiovascular: No chest pain Respiratory: No SOB Gastrointestinal: See HPI and otherwise negative Genitourinary: No dysuria Neurological: No headache, dizziness or syncope Musculoskeletal: No new muscle or joint pain Hematologic: No bleeding  Psychiatric: No history of depression or anxiety    Physical Exam:  Vital signs in last 24 hours: Temp:  [97.9 F (36.6 C)-98.2 F (36.8 C)] 97.9 F (36.6 C) (10/23 0643) Pulse Rate:  [85-98] 85 (10/23 0800) Resp:  [16-21] 21 (10/23 0800) BP: (122-143)/(50-74) 136/66 (10/23 0800) SpO2:  [94 %-100 %] 100 % (10/23 0800) Last BM Date : 01/13/23 General:   Pleasant elderly Caucasian female appears to be in NAD, Well developed, Well nourished, alert and cooperative Head:  Normocephalic and atraumatic. Eyes:   PEERL, EOMI. No icterus. Conjunctiva pink. Ears:  Normal auditory acuity. Neck:  Supple Throat: Oral cavity and pharynx without inflammation, swelling or lesion. Teeth in good condition. Lungs: Respirations even and unlabored. Lungs clear to auscultation bilaterally.   No wheezes, crackles, or rhonchi.  Heart: Normal S1, S2. No MRG. Regular rate and rhythm.  Pitting bilateral edema to the level of the knee with weeping and ulcers Abdomen:  Soft, nondistended, nontender. No rebound or guarding. Normal bowel sounds. No appreciable masses or hepatomegaly. Rectal:  Not performed.  Msk:  Symmetrical without gross deformities. Peripheral pulses intact.  Extremities:  Without edema, no deformity or joint abnormality. Normal ROM, normal sensation. Neurologic:  Alert and  oriented x4;  grossly normal neurologically.  Skin:   Dry and  intact without  significant lesions or rashes. Psychiatric: Demonstrates good judgement and reason without abnormal affect or behaviors.   LAB RESULTS: Recent Labs    01/12/23 1828 01/13/23 0420  WBC 6.8 6.7  HGB 6.8* 7.7*  HCT 24.4* 26.4*  PLT 352 313   BMET Recent Labs    01/12/23 1828 01/13/23 0420  NA 138 139  K 4.3 3.8  CL 111 111  CO2 20* 21*  GLUCOSE 105* 91  BUN 39* 33*  CREATININE 0.92 0.83  CALCIUM 8.3* 8.3*      Latest Ref Rng & Units 01/12/2023    6:28 PM 07/22/2019   10:14 AM 07/10/2019    9:56 AM  Hepatic Function  Total Protein 6.5 - 8.1 g/dL 5.6  7.1  6.8   Albumin 3.5 - 5.0 g/dL 2.8  3.2    AST 15 - 41 U/L 26  18  13    ALT 0 - 44 U/L 18  13  9    Alk Phosphatase 38 - 126 U/L 89  87    Total Bilirubin 0.3 - 1.2 mg/dL 0.5  0.7  0.4      PT/INR Recent Labs    01/12/23 1828  LABPROT 14.4  INR 1.1     Impression / Plan:   Impression: 1.  Anemia with heme positive stools: Apparently guaiac positive at PCPs office, hemoglobin 6.8 at admission (11.3 on 11/12/2021)--> 1 unit PRBCs--> 7.7, BUN elevated at 39, history of excessive Aleve use 3 tabs per day for the past 3 years, no GI complaints, no shortness of breath or palpitations, iron studies with a percent saturation low at 3% and iron low at 13, ferritin low at 10; consider GI bleed versus other 2.  Type 2 diabetes: On Mounjaro-uncertain when last dose was given  Plan: 1.  Patient will be scheduled for EGD and colonoscopy tomorrow with Dr. Adela Lank.  Did discuss risks, benefits, limitations and alternatives with the patient and she agrees to proceed. 2.  Patient will be on a clear liquid diet today and n.p.o. at midnight 3.  Patient will start movi prep in split dose fashion this evening 4.  Discussed that patient will likely need to discontinue Aleve. 5.  Continue to monitor hemoglobin with transfusion as needed less than 7 6.  Agree with iron infusion already given  Thank you for your kind consultation, we  will continue to follow.  Violet Baldy [facility]  01/13/2023, 8:42 AM

## 2023-01-13 NOTE — H&P (Addendum)
History and Physical    Patient: Debra [name] ZOX:[id number] DOB: 10-08-1945 DOA: 01/12/2023 DOS: the patient was seen and examined on 01/13/2023 PCP: Julien Girt, PA-C  Patient coming from: Home  Chief Complaint:  Chief Complaint  Patient presents with   Weakness   Abnormal Lab   HPI: Debra [name] is a 77 y.o. female with medical history significant of HTN, venous insufficiency, T2DM , SBO who presents with melena and acute anemia.   Patient saw PCP on 10/21 for chronic follow up and had blood work. Hgb returned at 7.1 and Guaiac positive. She initially refused going to ED but became nervous when they told her it was life-threatening. She denies seeing any melena or rectal bleeding.Has intermittent diarrhea. No hemorrhoids. No abdominal pain.No dizziness or shortness of breath. No chest pain.  She is chronically on Aleve for knee pain. Has been taking about 3 pills for the past 4-5 years. Not sure of the dosage. Denies alcohol use. Reports remote hx of colonoscopy with [facility] without significant finding. Never had endoscopy.  Had 18lbs weight loss but attributes that to be on Mounjaro for the past 2 years.   On arrival to ED, She was afebrile, normotensive on room air.   Repeat Hgb of 6.8 (remote baseline around 10-11). Creatinine of 0.92 and BUN of 39.   She was given PPI and started on pRBC transfusion. Hospitalist consulted for admission.  Review of Systems: As mentioned in the history of present illness. All other systems reviewed and are negative. Past Medical History:  Diagnosis Date   Acute osteomyelitis of calcaneum, left (HCC) 07/10/2019   Cellulitis of left leg    history of, most recent episode 01/09   Chronic venous insufficiency    Diabetes mellitus with neurological manifestation (HCC)    Diabetic foot infection (HCC) 07/10/2019   Diverticulitis    h/o 2-3 episodes in past   Group B streptococcal infection 07/10/2019   Hyperlipidemia LDL goal < 100     Hypertension goal BP (blood pressure) < 130/80    Leg edema    secondary to chronic venous insufficiency   Morbid obesity (HCC)    Osteopenia    DEXA scan 7/07   Pseudomonas aeruginosa infection 07/10/2019   Past Surgical History:  Procedure Laterality Date   ABDOMINAL HYSTERECTOMY     APPENDECTOMY     CHOLECYSTECTOMY     CHOLECYSTECTOMY N/A 04/14/2013   Procedure: LAPAROSCOPIC CHOLECYSTECTOMY WITH INTRAOPERATIVE CHOLANGIOGRAM;  Surgeon: Ernestene Mention, MD;  Location: [facility];  Service: General;  Laterality: N/A;   COLECTOMY     with diverting colsotmy and revision in the 1990's for diverticulitis   ERCP N/A 04/17/2013   Procedure: ENDOSCOPIC RETROGRADE CHOLANGIOPANCREATOGRAPHY (ERCP);  Surgeon: Hilarie Fredrickson, MD;  Location: [facility];  Service: Endoscopy;  Laterality: N/A;  note pt want general anesthesia for this case.  preferto perform in endo unit, but if unavailable please arrange time in OR   EYE SURGERY  8/13   left cataract removal & retinal repair   I & D EXTREMITY Left 07/26/2019   Procedure: LEFT PARTIAL CALCANEOUS EXCISION;  Surgeon: Nadara Mustard, MD;  Location: [facility] OR;  Service: Orthopedics;  Laterality: Left;   INSERTION OF MESH N/A 07/30/2017   Procedure: INSERTION OF MESH;  Surgeon: Griselda Miner, MD;  Location: [facility];  Service: General;  Laterality: N/A;   LAPAROSCOPIC LYSIS OF ADHESIONS N/A 04/14/2013   Procedure: LAPAROSCOPIC LYSIS OF ADHESIONS;  Surgeon: Ernestene Mention,  MD;  Location: [facility];  Service: General;  Laterality: N/A;   LAPAROTOMY N/A 07/30/2017   Procedure: EXPLORATORY LAPAROTOMY;  Surgeon: Griselda Miner, MD;  Location: [facility];  Service: General;  Laterality: N/A;   LYSIS OF ADHESION N/A 07/30/2017   Procedure: EXTENSIVE LYSIS OF ADHESION;  Surgeon: Griselda Miner, MD;  Location: [facility];  Service: General;  Laterality: N/A;   OMENTECTOMY N/A 07/30/2017   Procedure: PARTIAL OMENTECTOMY;  Surgeon: Griselda Miner, MD;  Location: [facility];  Service: General;   Laterality: N/A;   VENTRAL HERNIA REPAIR N/A 07/30/2017   Procedure: HERNIA REPAIR VENTRAL ADULT;  Surgeon: Griselda Miner, MD;  Location: [facility];  Service: General;  Laterality: N/A;   Social History:  reports that she has never smoked. She has never used smokeless tobacco. She reports that she does not drink alcohol and does not use drugs.  Allergies  Allergen Reactions   Tramadol Nausea And Vomiting    Hives   Banana Hives   Hydrocodone Hives   Neomycin-Polymyxin-Gramicidin Itching and Swelling    Eye drops caused swelling in face and rash and itching on arms and neck   Penicillins Hives and Itching    She has tolerated keflex Has patient had a PCN reaction causing immediate rash, facial/tongue/throat swelling, SOB or lightheadedness with hypotension: Yes Has patient had a PCN reaction causing severe rash involving mucus membranes or skin necrosis: No Has patient had a PCN reaction that required hospitalization: NO Has patient had a PCN reaction occurring within the last 10 years:NO     Aspirin Nausea Only    And causes bruises   Latex Hives and Rash   Metformin And Related Diarrhea   Voltaren [Diclofenac Sodium] Nausea And Vomiting    Family History  Problem Relation Age of Onset   Heart disease Mother    Heart disease Father    Heart disease Brother    Cancer Brother    Cancer Brother    Hypertension Sister    Heart disease Sister     Prior to Admission medications   Medication Sig Start Date End Date Taking? Authorizing Provider  atorvastatin (LIPITOR) 40 MG tablet TAKE 1 TABLET BY MOUTH EVERY DAY Patient taking differently: Take 40 mg by mouth at bedtime.  01/05/17   Sharon Seller, NP  cephALEXin (KEFLEX) 500 MG capsule Take 1 capsule (500 mg total) by mouth 4 (four) times daily. 09/14/19   Randall Hiss, MD  diphenhydrAMINE (BENADRYL) 25 MG tablet Take 25 mg by mouth at bedtime as needed for itching.    [provider]  ertapenem Pincus Sanes) 1 g  injection  09/12/19   [provider]  glucose blood (ACCU-CHEK AVIVA PLUS) test strip Ell.65 check blood sugar twice daily as directed 01/24/16   Reed, Tiffany L, DO  insulin aspart (NOVOLOG) 100 UNIT/ML injection Inject 0-5 Units into the skin at bedtime. 07/31/19   Kirt Boys, MD  insulin aspart (NOVOLOG) 100 UNIT/ML injection Inject 0-20 Units into the skin 3 (three) times daily with meals. 07/31/19   Kirt Boys, MD  insulin glargine (LANTUS) 100 UNIT/ML injection Inject 0.3 mLs (30 Units total) into the skin at bedtime. 07/31/19   Kirt Boys, MD  Insulin Pen Needle (BD PEN NEEDLE NANO U/F) 32G X 4 MM MISC Use once daily with the administration of Toujeo DX E11.65 02/20/16   Reed, Tiffany L, DO  insulin regular (NOVOLIN R) 100 units/mL injection Inject into the skin 3 (  three) times daily before meals. As directed    [provider]  metFORMIN (GLUCOPHAGE) 500 MG tablet Take 1 tablet (500 mg total) by mouth daily with breakfast. 07/26/17   Edsel Petrin, DO  metoprolol succinate (TOPROL-XL) 50 MG 24 hr tablet TAKE 1 TABLET BY MOUTH ONCE DAILY 06/08/16   Reed, Tiffany L, DO  naproxen sodium (ALEVE) 220 MG tablet Take 220 mg by mouth daily as needed (pain).     [provider]  omeprazole (PRILOSEC) 40 MG capsule  08/26/19   [provider]  ondansetron (ZOFRAN) 4 MG tablet Take 1 tablet (4 mg total) by mouth every 8 (eight) hours as needed for nausea or vomiting. 08/19/19   Daiva Eves, Lisette Grinder, MD  pantoprazole (PROTONIX) 40 MG tablet Take 1 tablet (40 mg total) by mouth daily. 07/31/19   Kirt Boys, MD  pioglitazone (ACTOS) 30 MG tablet Take 30 mg by mouth daily. 06/22/19   [provider]  polyethylene glycol (MIRALAX / GLYCOLAX) 17 g packet Take 17 g by mouth daily. 07/31/19   Kirt Boys, MD  potassium chloride SA (K-DUR,KLOR-CON) 20 MEQ tablet TAKE 1 TABLET(20 MEQ) BY MOUTH DAILY Patient taking differently: Take 20 mEq by  mouth daily.  06/03/16   Reed, Tiffany L, DO  predniSONE (STERAPRED UNI-PAK 21 TAB) 10 MG (21) TBPK tablet Take as directed 07/28/22   Tarry Kos, MD  senna (SENOKOT) 8.6 MG TABS tablet Take 1 tablet (8.6 mg total) by mouth daily. 07/31/19   Kirt Boys, MD  sulfamethoxazole-trimethoprim (BACTRIM DS) 800-160 MG tablet Take 1 tablet by mouth 2 (two) times daily. 09/14/19   Randall Hiss, MD  tirzepatide [facility]) 10 MG/0.5ML Pen Inject 10 mg into the skin once a week. 03/05/22     traMADol (ULTRAM) 50 MG tablet Take 1 tablet (50 mg total) by mouth every 8 (eight) hours as needed for severe pain (pain). 07/31/19   [place], Carley D, DO  valsartan (DIOVAN) 320 MG tablet Take 320 mg by mouth daily. 03/21/19   [provider]    Physical Exam: Vitals:   01/12/23 2145 01/12/23 2259 01/12/23 2315 01/12/23 2316  BP: (!) 137/50 (!) 138/58 (!) 141/62 (!) 141/62  Pulse: 94 97 98 98  Resp:  18 18 18   Temp:  98.1 F (36.7 C) 98.1 F (36.7 C) 98.1 F (36.7 C)  TempSrc:  Oral Oral Oral  SpO2: 100% 100% 100% 100%   Constitutional: NAD, calm, comfortable, well appearing elderly female laying upright in bed Eyes: lids and conjunctivae normal ENMT: Mucous membranes are moist.  Neck: normal, supple Respiratory: clear to auscultation bilaterally, no wheezing, no crackles. Normal respiratory effort. No accessory muscle use.  Cardiovascular: Regular rate and rhythm, no murmurs / rubs / gallops. Significant Non-pitting edema of bilateral lower extremity up to knees.  Abdomen: no tenderness, soft, obese abdomen Musculoskeletal: no clubbing / cyanosis. No joint deformity upper and lower extremities.  Normal muscle tone.  Skin: no rashes, lesions, ulcers. No induration Neurologic: CN 2-12 grossly intact. Strength 5/5 in all 4.  Psychiatric: Normal judgment and insight. Alert and oriented x 3. Normal mood.   Data Reviewed:  See HPI   Assessment and Plan: * Acute GI bleeding -pt has  not noted rectal bleeding or melena but guaiac positive at PCP office -presenting Hgb of 6.8 (remote baseline around 10-11) -has been on chronic NSAIDS for knee pain -receiving 1u pRBC transfusion -iron also low. Will administer IV venofer  -daily  IV PPI -Message sent to [facility] to consult in the morning  Type 2 diabetes mellitus (HCC) -last A1C of 5.4. No longer on insulin -on weekly Mounjaro and reports 18lb weight loss  HLD (hyperlipidemia) -continue statin  Essential hypertension -holding antihypertensive due to acute blood loss anemia      Advance Care Planning: Full  Consults: Messaged sent to [facility]  Family Communication: Pt wants to call her brother and update him herself  Severity of Illness: The appropriate patient status for this patient is INPATIENT. Inpatient status is judged to be reasonable and necessary in order to provide the required intensity of service to ensure the patient's safety. The patient's presenting symptoms, physical exam findings, and initial radiographic and laboratory data in the context of their chronic comorbidities is felt to place them at high risk for further clinical deterioration. Furthermore, it is not anticipated that the patient will be medically stable for discharge from the hospital within 2 midnights of admission.   * I certify that at the point of admission it is my clinical judgment that the patient will require inpatient hospital care spanning beyond 2 midnights from the point of admission due to high intensity of service, high risk for further deterioration and high frequency of surveillance required.*  Author: Anselm Jungling, DO 01/13/2023 1:29 AM  For on call review [email or web address].

## 2023-01-13 NOTE — Assessment & Plan Note (Addendum)
-  pt has not noted rectal bleeding or melena but guaiac positive at PCP office -presenting Hgb of 6.8 (remote baseline around 10-11) -has been on chronic NSAIDS for knee pain -receiving 1u pRBC transfusion -iron also low. Will administer IV venofer  -daily IV PPI -Message sent to [facility] to consult in the morning

## 2023-01-13 NOTE — Assessment & Plan Note (Signed)
-  last A1C of 5.4. No longer on insulin -on weekly Mounjaro and reports 18lb weight loss

## 2023-01-14 DIAGNOSIS — R195 Other fecal abnormalities: Secondary | ICD-10-CM | POA: Diagnosis not present

## 2023-01-14 DIAGNOSIS — D509 Iron deficiency anemia, unspecified: Secondary | ICD-10-CM | POA: Diagnosis not present

## 2023-01-14 DIAGNOSIS — K922 Gastrointestinal hemorrhage, unspecified: Secondary | ICD-10-CM | POA: Diagnosis not present

## 2023-01-14 LAB — CBC
HCT: 26.7 % — ABNORMAL LOW (ref 36.0–46.0)
Hemoglobin: 8 g/dL — ABNORMAL LOW (ref 12.0–15.0)
MCH: 23.5 pg — ABNORMAL LOW (ref 26.0–34.0)
MCHC: 30 g/dL (ref 30.0–36.0)
MCV: 78.3 fL — ABNORMAL LOW (ref 80.0–100.0)
Platelets: 322 10*3/uL (ref 150–400)
RBC: 3.41 MIL/uL — ABNORMAL LOW (ref 3.87–5.11)
RDW: 20.3 % — ABNORMAL HIGH (ref 11.5–15.5)
WBC: 5.6 10*3/uL (ref 4.0–10.5)
nRBC: 0 % (ref 0.0–0.2)

## 2023-01-14 LAB — GLUCOSE, CAPILLARY
Glucose-Capillary: 97 mg/dL (ref 70–99)
Glucose-Capillary: 97 mg/dL (ref 70–99)

## 2023-01-14 LAB — BASIC METABOLIC PANEL
Anion gap: 8 (ref 5–15)
BUN: 18 mg/dL (ref 8–23)
CO2: 23 mmol/L (ref 22–32)
Calcium: 8.8 mg/dL — ABNORMAL LOW (ref 8.9–10.3)
Chloride: 110 mmol/L (ref 98–111)
Creatinine, Ser: 0.94 mg/dL (ref 0.44–1.00)
GFR, Estimated: >60 mL/min (ref >60)
Glucose, Bld: 100 mg/dL — ABNORMAL HIGH (ref 70–99)
Potassium: 3.4 mmol/L — ABNORMAL LOW (ref 3.5–5.1)
Sodium: 141 mmol/L (ref 135–145)

## 2023-01-14 MED ORDER — PEG-KCL-NACL-NASULF-NA ASC-C 100 G PO SOLR
0.5000 | Freq: Once | ORAL | Status: AC
  Administered 2023-01-14: 100 g via ORAL

## 2023-01-14 MED ORDER — PEG-KCL-NACL-NASULF-NA ASC-C 100 G PO SOLR: 1.0000 | Freq: Once | ORAL | Status: DC

## 2023-01-14 MED ORDER — POTASSIUM CHLORIDE 10 MEQ/100ML IV SOLN: 10.0000 meq | INTRAVENOUS | Status: DC

## 2023-01-14 MED ORDER — PEG-KCL-NACL-NASULF-NA ASC-C 100 G PO SOLR
0.5000 | Freq: Once | ORAL | Status: AC
  Administered 2023-01-14: 100 g via ORAL
  Filled 2023-01-14: qty 1

## 2023-01-14 MED ORDER — ACETAMINOPHEN 325 MG PO TABS
650.0000 mg | ORAL_TABLET | Freq: Four times a day (QID) | ORAL | Status: DC | PRN
  Administered 2023-01-14: 650 mg via ORAL
  Filled 2023-01-14: qty 2

## 2023-01-14 MED ORDER — POTASSIUM CHLORIDE CRYS ER 20 MEQ PO TBCR
40.0000 meq | EXTENDED_RELEASE_TABLET | Freq: Once | ORAL | Status: AC
  Administered 2023-01-14: 40 meq via ORAL
  Filled 2023-01-14: qty 2

## 2023-01-14 NOTE — H&P (View-Only) (Signed)
    Progress Note   Subjective  Hospital day #2 Chief Complaint: Anemia with heme positive stools  Today, patient tells me that she drank all of the bowel prep but nothing even happened until this morning, now she has been "going constantly".  Aware that we will have to postpone her procedures until tomorrow.  No new complaints or concerns.   Objective   Vital signs in last 24 hours: Temp:  [97.7 F (36.5 C)-98.8 F (37.1 C)] 98 F (36.7 C) (10/24 0851) Pulse Rate:  [82-98] 83 (10/24 0851) Resp:  [17-20] 18 (10/24 0851) BP: (101-134)/(51-72) 104/60 (10/24 0851) SpO2:  [93 %-100 %] 99 % (10/24 0851) Weight:  [92.3 kg-98.9 kg] 92.3 kg (10/24 0500) Last BM Date : 01/13/23 General:   Elderly white female in NAD Heart:  Regular rate and rhythm; no murmurs Lungs: Respirations even and unlabored, lungs CTA bilaterally Abdomen:  Soft, nontender and nondistended. Normal bowel sounds. Psych:  Cooperative. Normal mood and affect.  Intake/Output from previous day: 10/23 0701 - 10/24 0700 In: 1469.4 [P.O.:1210; I.V.:259.4] Out: -   Lab Results: Recent Labs    01/12/23 1828 01/13/23 0420 01/14/23 0512  WBC 6.8 6.7 5.6  HGB 6.8* 7.7* 8.0*  HCT 24.4* 26.4* 26.7*  PLT 352 313 322   BMET Recent Labs    01/12/23 1828 01/13/23 0420 01/14/23 0512  NA 138 139 141  K 4.3 3.8 3.4*  CL 111 111 110  CO2 20* 21* 23  GLUCOSE 105* 91 100*  BUN 39* 33* 18  CREATININE 0.92 0.83 0.94  CALCIUM 8.3* 8.3* 8.8*   LFT Recent Labs    01/12/23 1828  PROT 5.6*  ALBUMIN 2.8*  AST 26  ALT 18  ALKPHOS 89  BILITOT 0.5   PT/INR Recent Labs    01/12/23 1828  LABPROT 14.4  INR 1.1    Assessment / Plan:   Assessment: 1.  Anemia with heme positive stools: Guaiac positive at PCPs office, hemoglobin 6.8 at admission (11.3 on 11/12/2021)--> 1 unit PRBCs--> 7.7--> 8.0, iron deficient as well status post 1 iron infusion this hospitalization 2.  Type 2 diabetes: On Mounjaro-last dose 7 days  ago  Plan: 1.  Patient's EGD and colonoscopy had to be postponed to tomorrow as prep did not start working until early this morning. 2.  Will order another dose of movi prep to be given today in split dose fashion. 3.  Patient will remain on a clear liquid diet today and n.p.o. at midnight 4.  Continue to monitor hemoglobin with transfusion as needed less than 7 5.  Patient was slightly hypokalemic today, will leave correction to the hospitalist team  Thank you for your kind consultation, we will continue to follow.    LOS: 1 day   Unk Lightning  01/14/2023, 10:12 AM

## 2023-01-14 NOTE — TOC CM/SW Note (Addendum)
Transition of Care [facility]) - Inpatient Brief Assessment   Patient Details  Name: BRINIYA LANGEL MRN: [id number] Date of Birth: 1945/06/03  [facility]) CM/SW Contact:    Tom-Johnson, Hershal Coria, RN Phone Number: 01/14/2023, 4:35 PM   Clinical Narrative:  Patient presented to the ED from her PCP's office with hgb of 7.1 and positive Hemoccult. Repeat Hgb in the ED shows 6.8. 1U PRBC given. Hgb now at 8.0. GI follow. Patient scheduled for EGD and Colonoscopy tomorrow.   No PT f/u, no TOC needs or recommendations noted at this time.  Patient not Medically ready for discharge.  CM will continue to follow as patient progresses with care towards discharge.      Transition of Care Asessment: Insurance and Status: Insurance coverage has been reviewed Patient has primary care physician: Yes Home environment has been reviewed: Yes Prior level of function:: Independent Prior/Current Home Services: No current home services Social Determinants of Health Reivew: SDOH reviewed no interventions necessary Readmission risk has been reviewed: Yes Transition of care needs: no transition of care needs at this time

## 2023-01-14 NOTE — Progress Notes (Signed)
    Progress Note   Subjective  Hospital day #2 Chief Complaint: Anemia with heme positive stools  Today, patient tells me that she drank all of the bowel prep but nothing even happened until this morning, now she has been "going constantly".  Aware that we will have to postpone her procedures until tomorrow.  No new complaints or concerns.   Objective   Vital signs in last 24 hours: Temp:  [97.7 F (36.5 C)-98.8 F (37.1 C)] 98 F (36.7 C) (10/24 0851) Pulse Rate:  [82-98] 83 (10/24 0851) Resp:  [17-20] 18 (10/24 0851) BP: (101-134)/(51-72) 104/60 (10/24 0851) SpO2:  [93 %-100 %] 99 % (10/24 0851) Weight:  [92.3 kg-98.9 kg] 92.3 kg (10/24 0500) Last BM Date : 01/13/23 General:   Elderly white female in NAD Heart:  Regular rate and rhythm; no murmurs Lungs: Respirations even and unlabored, lungs CTA bilaterally Abdomen:  Soft, nontender and nondistended. Normal bowel sounds. Psych:  Cooperative. Normal mood and affect.  Intake/Output from previous day: 10/23 0701 - 10/24 0700 In: 1469.4 [P.O.:1210; I.V.:259.4] Out: -   Lab Results: Recent Labs    01/12/23 1828 01/13/23 0420 01/14/23 0512  WBC 6.8 6.7 5.6  HGB 6.8* 7.7* 8.0*  HCT 24.4* 26.4* 26.7*  PLT 352 313 322   BMET Recent Labs    01/12/23 1828 01/13/23 0420 01/14/23 0512  NA 138 139 141  K 4.3 3.8 3.4*  CL 111 111 110  CO2 20* 21* 23  GLUCOSE 105* 91 100*  BUN 39* 33* 18  CREATININE 0.92 0.83 0.94  CALCIUM 8.3* 8.3* 8.8*   LFT Recent Labs    01/12/23 1828  PROT 5.6*  ALBUMIN 2.8*  AST 26  ALT 18  ALKPHOS 89  BILITOT 0.5   PT/INR Recent Labs    01/12/23 1828  LABPROT 14.4  INR 1.1    Assessment / Plan:   Assessment: 1.  Anemia with heme positive stools: Guaiac positive at PCPs office, hemoglobin 6.8 at admission (11.3 on 11/12/2021)--> 1 unit PRBCs--> 7.7--> 8.0, iron deficient as well status post 1 iron infusion this hospitalization 2.  Type 2 diabetes: On Mounjaro-last dose 7 days  ago  Plan: 1.  Patient's EGD and colonoscopy had to be postponed to tomorrow as prep did not start working until early this morning. 2.  Will order another dose of movi prep to be given today in split dose fashion. 3.  Patient will remain on a clear liquid diet today and n.p.o. at midnight 4.  Continue to monitor hemoglobin with transfusion as needed less than 7 5.  Patient was slightly hypokalemic today, will leave correction to the hospitalist team  Thank you for your kind consultation, we will continue to follow.    LOS: 1 day   Unk Lightning  01/14/2023, 10:12 AM

## 2023-01-14 NOTE — Progress Notes (Signed)
PROGRESS NOTE  Debra [name]  QIO:[id number] DOB: 10/17/1945 DOA: 01/12/2023 PCP: Julien Girt, PA-C   Brief Narrative: Patient is a 77 year old female with history of hypertension, venous insufficiency, type 2 diabetes, SBO who presented for the evaluation of low hemoglobin.  She recently had outpatient blood work which showed hemoglobin of 7.1, FOBT positive.  No report of melena or rectal bleeding or abdominal pain.  Takes Aleve for knee pain.  Patient history of weight loss and was taking Mounjaro.  On presentation, hemoglobin found to be 6.8, given unit of blood transfusion.  Started on PPI for the suspicion for upper GI bleed.  GI consulted.  Unsatisfactory bowel prep today so plan for EGD/colonoscopy tomorrow  Assessment & Plan:  Principal Problem:   Acute GI bleeding Active Problems:   Essential hypertension   HLD (hyperlipidemia)   Type 2 diabetes mellitus (HCC)   Upper GI bleed   Symptomatic anemia   Iron deficiency anemia   Heme positive stool  Upper GI bleed: No report of hematochezia or melena but hemoglobin low when checked at PCPs office.  Presented with hemoglobin of 6.8.  FOBT positive when checked as an outpatient.  Takes Aleve chronically for last few years for knee pain.  Process for GI bleed.  Started on Protonix IV.  GI consulted.  Plan for EGD/colonoscopy tomorrow. Hemoglobin stable in the range of 8 today   Acute blood loss anemia/Iron deficiency: Hemoglobin of 6.8 on presentation, given unit of PRBC.  Monitor. Iron of just 13.  Given her IV iron and started on oral iron supplementation.  Monitor hemoglobin   Type 2 diabetes: A1c of 5.4.  On weekly Mounjaro.   Hyperlipidemia: On statin   Hypertension: Currently normotensive.  Home medications on hold  Bilateral lower extremity lymphedema: Follows with Dr. Lajoyce Corners.  Bilateral lower extremities are significantly  edematous.  Likely chronic lymphedema.  She says she takes fluid pill at home but not seen  on med rec.started on IV Lasix, lymphedema improving, continue Lasix at same dose for today  Hypokalemia: Supplement with potassium  General Deconditioning: PT consulted         DVT prophylaxis:SCDs Start: 01/13/23 0115     Code Status: Full Code  Family Communication: None at the bedside  Patient status:Inpatient  Patient is from :home  Anticipated discharge LK:GMWN  Estimated DC date: After GI workup   Consultants: GI  Procedures:None yet  Antimicrobials:  Anti-infectives (From admission, onward)    None       Subjective: Patient seen and examined at bedside.  She looks comfortable.  Hemodynamically stable.  No report of hematochezia or melena after admission.  No abdominal pain.  Bilateral lower EXTR lymphedema improving and she is having plenty of diuresis.  She feels better today.  Objective: Vitals:   01/14/23 0012 01/14/23 0423 01/14/23 0500 01/14/23 0851  BP: (!) 105/51 (!) 101/56  104/60  Pulse: 97 89  83  Resp: 20 18  18   Temp: 98.2 F (36.8 C) 98.8 F (37.1 C)  98 F (36.7 C)  TempSrc: Oral Oral    SpO2: 99% 97%  99%  Weight:   92.3 kg     Intake/Output Summary (Last 24 hours) at 01/14/2023 1136 Last data filed at 01/14/2023 0400 Gross per 24 hour  Intake 1469.44 ml  Output --  Net 1469.44 ml   Filed Weights   01/13/23 1357 01/14/23 0500  Weight: 98.9 kg 92.3 kg    Examination:  General exam:  Overall comfortable, not in distress, pleasant female HEENT: PERRL Respiratory system:  no wheezes or crackles  Cardiovascular system: S1 & S2 heard, RRR.  Gastrointestinal system: Abdomen is nondistended, soft and nontender. Central nervous system: Alert and oriented Extremities: Bilateral lower  extremity lymphedema, no clubbing ,no cyanosis Skin: Shallow ulcerations on bilateral legs, no icterus     Data Reviewed: I have personally reviewed following labs and imaging studies  CBC: Recent Labs  Lab 01/12/23 1828 01/13/23 0420  01/14/23 0512  WBC 6.8 6.7 5.6  NEUTROABS 3.3  --   --   HGB 6.8* 7.7* 8.0*  HCT 24.4* 26.4* 26.7*  MCV 80.5 80.2 78.3*  PLT 352 313 322   Basic Metabolic Panel: Recent Labs  Lab 01/12/23 1828 01/13/23 0420 01/14/23 0512  NA 138 139 141  K 4.3 3.8 3.4*  CL 111 111 110  CO2 20* 21* 23  GLUCOSE 105* 91 100*  BUN 39* 33* 18  CREATININE 0.92 0.83 0.94  CALCIUM 8.3* 8.3* 8.8*     No results found for this or any previous visit (from the past 240 hour(s)).   Radiology Studies: No results found.  Scheduled Meds:  atorvastatin  40 mg Oral QHS   ferrous sulfate  325 mg Oral Q breakfast   furosemide  40 mg Intravenous Q12H   pantoprazole (PROTONIX) IV  40 mg Intravenous Q24H   peg 3350 powder  0.5 kit Oral Once   And   peg 3350 powder  0.5 kit Oral Once   Continuous Infusions:  sodium chloride 20 mL/hr at 01/13/23 1454     LOS: 1 day   Burnadette Pop, MD [facility] P10/24/2024, 11:36 AM

## 2023-01-14 NOTE — Telephone Encounter (Signed)
Patient hospitalized as of 01/12/23 due to low hemoglobin and blood noted in stool.

## 2023-01-14 NOTE — Progress Notes (Signed)
Patient had finished drinking bowel prep before midnight but only had 3 formed soft stool so far. Danis,MD notified. No new orders.

## 2023-01-14 NOTE — Evaluation (Signed)
Physical Therapy Evaluation Patient Details Name: Debra [name] MRN: [id number] DOB: Mar 11, 1946 Today's Date: 01/14/2023  History of Present Illness  Pt is a 77 y/o F admitted on 01/12/23 after presenting for evaluation of low Hgb following outpatient blood work. Suspicion for upper GI bleed. PMH: HTN, venous insufficiency, DM2, SBO  Clinical Impression  Pt seen for PT evaluation with pt agreeable to tx. Pt reports prior to admission she was mod I with SPC, lives with brother in 1 level home with 3 steps with L ascending rail to enter. On this date, pt is able to ambulate with IV pole to simulate cane but with decreased balance & 1 LOB. Pt then ambulates with RW & supervision with improved balance. PT recommends pt use RW vs SPC to reduce fall risk. Pt does not fatigue after gait but reports this is close to baseline. Will continue to follow pt acutely to address strengthening, balance, endurance, & gait with LRAD.        If plan is discharge home, recommend the following:     Can travel by private vehicle        Equipment Recommendations None recommended by PT (pt reports she has a RW at home)  Recommendations for Other Services       Functional Status Assessment Patient has had a recent decline in their functional status and demonstrates the ability to make significant improvements in function in a reasonable and predictable amount of time.     Precautions / Restrictions Precautions Precautions: Fall Restrictions Weight Bearing Restrictions: No      Mobility  Bed Mobility               General bed mobility comments: not tested, pt received on BSC, left sitting EOB    Transfers Overall transfer level: Needs assistance Equipment used: None Transfers: Sit to/from Stand, Bed to chair/wheelchair/BSC Sit to Stand: Modified independent (Device/Increase time) (STS from BSC, EOB) Stand pivot transfers: Modified independent (Device/Increase time)               Ambulation/Gait Ambulation/Gait assistance: Supervision Gait Distance (Feet): 150 Feet Assistive device: IV Pole, Rolling walker (2 wheels) Gait Pattern/deviations: Decreased step length - right, Decreased step length - left, Decreased dorsiflexion - right, Decreased stride length, Decreased weight shift to left, Decreased stance time - left, Antalgic Gait velocity: decreased     General Gait Details: Pt ambulates with IV pole in LUE to simulate cane pt uses at home, pt with 1 LOB, but self corrects by holding rail in hallway with RUE. PT continued to suggest using RW & pt finally agreeable. Pt is able to ambulate with RW & supervision.  Stairs            Wheelchair Mobility     Tilt Bed    Modified Rankin (Stroke Patients Only)       Balance Overall balance assessment: Needs assistance Sitting-balance support: Feet supported, No upper extremity supported Sitting balance-Leahy Scale: Good     Standing balance support: During functional activity, Single extremity supported Standing balance-Leahy Scale: Fair                               Pertinent Vitals/Pain Pain Assessment Pain Assessment: No/denies pain    Home Living Family/patient expects to be discharged to:: Private residence Living Arrangements: Other relatives (brother) Available Help at Discharge: Family;Available 24 hours/day Type of Home: House Home Access: Stairs to enter Entrance  Stairs-Rails: Left (ascending) Entrance Stairs-Number of Steps: 3   Home Layout: One level Home Equipment: Rolling Walker (2 wheels);Grab bars - tub/shower (hurry cane)      Prior Function Prior Level of Function : History of Falls (last six months);Independent/Modified Independent;Driving             Mobility Comments: Ambulatory with cane, 2 falls in past 6 months (1 tripping outside) ADLs Comments: stands to shower, sits on toilet then transfers feet out of tub (similar to TTB), cooks, cleans,  grocery shops     Extremity/Trunk Assessment   Upper Extremity Assessment Upper Extremity Assessment: Generalized weakness    Lower Extremity Assessment Lower Extremity Assessment: Generalized weakness       Communication   Communication Communication: No apparent difficulties  Cognition Arousal: Alert Behavior During Therapy: WFL for tasks assessed/performed Overall Cognitive Status: Within Functional Limits for tasks assessed                                 General Comments: AxOx4        General Comments General comments (skin integrity, edema, etc.): Max HR 122 bpm with gait    Exercises     Assessment/Plan    PT Assessment Patient needs continued PT services  PT Problem List Decreased strength;Cardiopulmonary status limiting activity;Decreased activity tolerance;Decreased balance;Decreased mobility;Decreased knowledge of use of DME       PT Treatment Interventions DME instruction;Balance training;Modalities;Neuromuscular re-education;Gait training;Stair training;Functional mobility training;Therapeutic activities;Therapeutic exercise;Manual techniques;Patient/family education    PT Goals (Current goals can be found in the Care Plan section)  Acute Rehab PT Goals Patient Stated Goal: get procedure over with, return home PT Goal Formulation: With patient Time For Goal Achievement: 01/28/23 Potential to Achieve Goals: Good    Frequency Min 1X/week     Co-evaluation               AM-PAC PT "6 Clicks" Mobility  Outcome Measure Help needed turning from your back to your side while in a flat bed without using bedrails?: None Help needed moving from lying on your back to sitting on the side of a flat bed without using bedrails?: None Help needed moving to and from a bed to a chair (including a wheelchair)?: None Help needed standing up from a chair using your arms (e.g., wheelchair or bedside chair)?: None Help needed to walk in hospital  room?: A Little Help needed climbing 3-5 steps with a railing? : A Little 6 Click Score: 22    End of Session   Activity Tolerance: Patient tolerated treatment well;Patient limited by fatigue Patient left: in bed;with call bell/phone within reach   PT Visit Diagnosis: Unsteadiness on feet (R26.81);Other abnormalities of gait and mobility (R26.89)    Time: 4098-1191 PT Time Calculation (min) (ACUTE ONLY): 15 min   Charges:   PT Evaluation $PT Eval Low Complexity: 1 Low   PT General Charges $$ ACUTE PT VISIT: 1 Visit         Aleda Grana, PT, DPT 01/14/23, 3:10 PM   Sandi Mariscal 01/14/2023, 3:09 PM

## 2023-01-15 ENCOUNTER — Encounter (HOSPITAL_COMMUNITY)
Admission: EM | Disposition: A | Discharge status: Home / Self Care | Payer: Self-pay | Source: Ambulatory Visit | Attending: Internal Medicine

## 2023-01-15 ENCOUNTER — Inpatient Hospital Stay (HOSPITAL_COMMUNITY): Payer: Medicare Other | Admitting: Certified Registered"

## 2023-01-15 DIAGNOSIS — D125 Benign neoplasm of sigmoid colon: Secondary | ICD-10-CM

## 2023-01-15 DIAGNOSIS — D126 Benign neoplasm of colon, unspecified: Secondary | ICD-10-CM

## 2023-01-15 DIAGNOSIS — D509 Iron deficiency anemia, unspecified: Secondary | ICD-10-CM | POA: Diagnosis not present

## 2023-01-15 DIAGNOSIS — K317 Polyp of stomach and duodenum: Secondary | ICD-10-CM | POA: Diagnosis not present

## 2023-01-15 DIAGNOSIS — K648 Other hemorrhoids: Secondary | ICD-10-CM | POA: Diagnosis not present

## 2023-01-15 DIAGNOSIS — K5730 Diverticulosis of large intestine without perforation or abscess without bleeding: Secondary | ICD-10-CM | POA: Diagnosis not present

## 2023-01-15 DIAGNOSIS — R195 Other fecal abnormalities: Secondary | ICD-10-CM | POA: Diagnosis not present

## 2023-01-15 DIAGNOSIS — E119 Type 2 diabetes mellitus without complications: Secondary | ICD-10-CM

## 2023-01-15 DIAGNOSIS — K922 Gastrointestinal hemorrhage, unspecified: Secondary | ICD-10-CM | POA: Diagnosis not present

## 2023-01-15 HISTORY — PX: ESOPHAGOGASTRODUODENOSCOPY (EGD) WITH PROPOFOL: SHX5813

## 2023-01-15 HISTORY — PX: POLYPECTOMY: SHX5525

## 2023-01-15 HISTORY — PX: COLONOSCOPY WITH PROPOFOL: SHX5780

## 2023-01-15 HISTORY — PX: HEMOSTASIS CLIP PLACEMENT: SHX6857

## 2023-01-15 LAB — BASIC METABOLIC PANEL
Anion gap: 12 (ref 5–15)
BUN: 15 mg/dL (ref 8–23)
CO2: 22 mmol/L (ref 22–32)
Calcium: 8.6 mg/dL — ABNORMAL LOW (ref 8.9–10.3)
Chloride: 109 mmol/L (ref 98–111)
Creatinine, Ser: 0.89 mg/dL (ref 0.44–1.00)
GFR, Estimated: >60 mL/min (ref >60)
Glucose, Bld: 95 mg/dL (ref 70–99)
Potassium: 3.5 mmol/L (ref 3.5–5.1)
Sodium: 143 mmol/L (ref 135–145)

## 2023-01-15 LAB — CBC
HCT: 25.9 % — ABNORMAL LOW (ref 36.0–46.0)
Hemoglobin: 7.6 g/dL — ABNORMAL LOW (ref 12.0–15.0)
MCH: 23.3 pg — ABNORMAL LOW (ref 26.0–34.0)
MCHC: 29.3 g/dL — ABNORMAL LOW (ref 30.0–36.0)
MCV: 79.4 fL — ABNORMAL LOW (ref 80.0–100.0)
Platelets: 317 10*3/uL (ref 150–400)
RBC: 3.26 MIL/uL — ABNORMAL LOW (ref 3.87–5.11)
RDW: 20.7 % — ABNORMAL HIGH (ref 11.5–15.5)
WBC: 5.2 10*3/uL (ref 4.0–10.5)
nRBC: 0 % (ref 0.0–0.2)

## 2023-01-15 LAB — GLUCOSE, CAPILLARY
Glucose-Capillary: 99 mg/dL (ref 70–99)
Glucose-Capillary: 87 mg/dL (ref 70–99)

## 2023-01-15 SURGERY — COLONOSCOPY WITH PROPOFOL
Anesthesia: Monitor Anesthesia Care

## 2023-01-15 MED ORDER — SODIUM CHLORIDE 0.9 % IV SOLN: 12.5000 mg | INTRAVENOUS | Status: DC | PRN

## 2023-01-15 MED ORDER — OXYCODONE HCL 5 MG PO TABS: 5.0000 mg | ORAL_TABLET | Freq: Once | ORAL | Status: DC | PRN

## 2023-01-15 MED ORDER — HYDROMORPHONE HCL 1 MG/ML IJ SOLN: 0.2500 mg | INTRAMUSCULAR | Status: DC | PRN

## 2023-01-15 MED ORDER — OXYCODONE HCL 5 MG/5ML PO SOLN
5.0000 mg | Freq: Once | ORAL | Status: DC | PRN
Start: 2023-01-15 — End: 2023-01-15

## 2023-01-15 MED ORDER — PROPOFOL 10 MG/ML IV BOLUS
INTRAVENOUS | Status: DC | PRN
  Administered 2023-01-15: 30 mg via INTRAVENOUS
  Administered 2023-01-15: 60 mg via INTRAVENOUS

## 2023-01-15 MED ORDER — SODIUM CHLORIDE 0.9 % IV SOLN
100.0000 mg | Freq: Once | INTRAVENOUS | Status: AC
  Administered 2023-01-15: 100 mg via INTRAVENOUS
  Filled 2023-01-15: qty 5

## 2023-01-15 MED ORDER — FERROUS GLUCONATE 324 (38 FE) MG PO TABS: 324.0000 mg | ORAL_TABLET | Freq: Two times a day (BID) | ORAL | 0 refills | Status: DC

## 2023-01-15 MED ORDER — LIDOCAINE 2% (20 MG/ML) 5 ML SYRINGE
INTRAMUSCULAR | Status: DC | PRN
  Administered 2023-01-15: 60 mg via INTRAVENOUS

## 2023-01-15 MED ORDER — PROPOFOL 500 MG/50ML IV EMUL
INTRAVENOUS | Status: DC | PRN
  Administered 2023-01-15: 100 ug/kg/min via INTRAVENOUS

## 2023-01-15 MED ORDER — SODIUM CHLORIDE 0.9 % IV SOLN: INTRAVENOUS | Status: DC | PRN

## 2023-01-15 MED ORDER — PANTOPRAZOLE SODIUM 40 MG PO TBEC: 40.0000 mg | DELAYED_RELEASE_TABLET | Freq: Two times a day (BID) | ORAL | 1 refills | Status: AC

## 2023-01-15 MED ORDER — PHENYLEPHRINE HCL (PRESSORS) 10 MG/ML IV SOLN
INTRAVENOUS | Status: DC | PRN
  Administered 2023-01-15: 160 ug via INTRAVENOUS
  Administered 2023-01-15 (×2): 80 ug via INTRAVENOUS

## 2023-01-15 SURGICAL SUPPLY — 25 items

## 2023-01-15 NOTE — Discharge Summary (Signed)
Physician Discharge Summary  Debra [name][id number] DOB: 1946-03-08 DOA: 01/12/2023  PCP: Julien Girt, PA-C  Admit date: 01/12/2023 Discharge date: 01/15/2023  Admitted From: home Disposition:  home  Recommendations for Outpatient Follow-up:  Follow up with PCP in 1-2 weeks Follow-up with gastroenterology in 1 week for biopsy results Please obtain BMP/CBC in one week  Home Health: none Equipment/Devices: none  Discharge Condition: stable CODE STATUS: Full code  HPI: Per admitting MD, Debra [name] is a 77 y.o. female with medical history significant of HTN, venous insufficiency, T2DM , SBO who presents with melena and acute anemia.  Patient saw PCP on 10/21 for chronic follow up and had blood work. Hgb returned at 7.1 and Guaiac positive. She initially refused going to ED but became nervous when they told her it was life-threatening. She denies seeing any melena or rectal bleeding.Has intermittent diarrhea. No hemorrhoids. No abdominal pain.No dizziness or shortness of breath. No chest pain.  She is chronically on Aleve for knee pain. Has been taking about 3 pills for the past 4-5 years. Not sure of the dosage. Denies alcohol use. Reports remote hx of colonoscopy with [facility] without significant finding. Never had endoscopy.  Had 18lbs weight loss but attributes that to be on Mounjaro for the past 2 years. On arrival to ED, She was afebrile, normotensive on room air.  Repeat Hgb of 6.8 (remote baseline around 10-11). Creatinine of 0.92 and BUN of 39.  She was given PPI and started on pRBC transfusion. Hospitalist consulted for admission.  Hospital Course / Discharge diagnoses: Principal Problem:   Acute GI bleeding Active Problems:   Essential hypertension   HLD (hyperlipidemia)   Type 2 diabetes mellitus (HCC)   Upper GI bleed   Symptomatic anemia   Iron deficiency anemia   Heme positive stool   Gastric polyp   Benign neoplasm of colon   Principal  problem Upper GI bleed -there is no report of hematochezia or melena, however hemoglobin was low when checked at PCPs office, and also fecal occult was positive.  She came to the hospital with a hemoglobin of 6.8, was placed on Protonix and was transfused a unit of packed red blood cells.  Gastroenterology was consulted and evaluated the patient, she underwent a EGD on 10/25 which showed single gastric polyp which may be the culprit for her blood loss anemia, s/p resection and was sent for biopsies.  Colonoscopy showed diverticulosis in the transverse colon and left colon, and also one 3 millimeter polyp in the sigmoid colon status post removal.  She was empirically given a dose of IV iron, will be placed on twice daily PPI for the next 2 weeks then once daily.  She will have outpatient follow-up with gastroenterology for repeat CBC.  She is stable post procedures, tolerating a diet, and will be discharged home with outpatient follow-up.  Active problems Acute blood loss anemia, iron deficiency -was given IV iron while hospitalized.  Will place on iron on discharge DM2-controlled, A1c of 5.4.  On Mounjaro Hyperlipidemia-continue statin Essential hypertension-resume home medication Bilateral lower extremity lymphedema-follow-up with Dr. 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  She would benefit from weight loss  Sepsis ruled out   Discharge Instructions   Allergies as of 01/15/2023       Reactions   Tramadol Nausea And Vomiting   Hives   Banana Hives   Hydrocodone Hives   Neomycin-polymyxin-gramicidin Itching, Swelling   Eye drops caused swelling in  face and rash and itching on arms and neck   Penicillins Hives, Itching   She has tolerated keflex Has patient had a PCN reaction causing immediate rash, facial/tongue/throat swelling, SOB or lightheadedness with hypotension: Yes Has patient had a PCN reaction causing severe rash involving mucus membranes  or skin necrosis: No Has patient had a PCN reaction that required hospitalization: NO Has patient had a PCN reaction occurring within the last 10 years:NO   Aspirin Nausea Only   And causes bruises   Latex Hives, Rash   Metformin And Related Diarrhea   Voltaren [diclofenac Sodium] Nausea And Vomiting        Medication List     STOP taking these medications    naproxen sodium 220 MG tablet Commonly known as: ALEVE   omeprazole 40 MG capsule Commonly known as: PRILOSEC       TAKE these medications    atorvastatin 40 MG tablet Commonly known as: LIPITOR TAKE 1 TABLET BY MOUTH EVERY DAY What changed: when to take this   diphenhydrAMINE 25 MG tablet Commonly known as: BENADRYL Take 25 mg by mouth at bedtime as needed for itching.   ferrous gluconate 324 MG tablet Commonly known as: FERGON Take 1 tablet (324 mg total) by mouth 2 (two) times daily with a meal.   glucose blood test strip Commonly known as: Accu-Chek Aviva Plus Ell.65 check blood sugar twice daily as directed   Insulin Pen Needle 32G X 4 MM Misc Commonly known as: BD Pen Needle Nano U/F Use once daily with the administration of Toujeo DX E11.65   Mounjaro 10 MG/0.5ML Pen Generic drug: tirzepatide Inject 10 mg into the skin once a week.   ondansetron 4 MG tablet Commonly known as: ZOFRAN Take 1 tablet (4 mg total) by mouth every 8 (eight) hours as needed for nausea or vomiting.   pantoprazole 40 MG tablet Commonly known as: Protonix Take 1 tablet (40 mg total) by mouth 2 (two) times daily. Twice daily for 2 weeks then once daily.   pioglitazone 30 MG tablet Commonly known as: ACTOS Take 30 mg by mouth daily.   potassium chloride SA 20 MEQ tablet Commonly known as: KLOR-CON M TAKE 1 TABLET(20 MEQ) BY MOUTH DAILY What changed: See the new instructions.   valsartan 320 MG tablet Commonly known as: DIOVAN Take 320 mg by mouth daily.        Follow-up Information     Armbruster, Willaim Rayas, MD Follow up.   Specialty: Gastroenterology Why: call in 3-4 days for biopsy results and follow up instructions Contact information: [address] [place] [address] [phone]                 Consultations: GI  Procedures/Studies:  No results found.   Subjective: - no chest pain, shortness of breath, no abdominal pain, nausea or vomiting.   Discharge Exam: BP (!) 135/101 (BP Location: Right Arm)   Pulse 78   Temp 98 F (36.7 C)   Resp 20   Ht 5\' 4"  (1.626 m)   Wt 94.3 kg   LMP 07/02/1973   SpO2 100%   BMI 35.70 kg/m   General: Pt is alert, awake, not in acute distress Cardiovascular: RRR, S1/S2 +, no rubs, no gallops Respiratory: CTA bilaterally, no wheezing, no rhonchi Abdominal: Soft, NT, ND, bowel sounds + Extremities: no edema, no cyanosis    The results of significant diagnostics from this hospitalization (including imaging, microbiology, ancillary and laboratory)  are listed below for reference.     Microbiology: No results found for this or any previous visit (from the past 240 hour(s)).   Labs: Basic Metabolic Panel: Recent Labs  Lab 01/12/23 1828 01/13/23 0420 01/14/23 0512 01/15/23 0505  NA 138 139 141 143  K 4.3 3.8 3.4* 3.5  CL 111 111 110 109  CO2 20* 21* 23 22  GLUCOSE 105* 91 100* 95  BUN 39* 33* 18 15  CREATININE 0.92 0.83 0.94 0.89  CALCIUM 8.3* 8.3* 8.8* 8.6*   Liver Function Tests: Recent Labs  Lab 01/12/23 1828  AST 26  ALT 18  ALKPHOS 89  BILITOT 0.5  PROT 5.6*  ALBUMIN 2.8*   CBC: Recent Labs  Lab 01/12/23 1828 01/13/23 0420 01/14/23 0512 01/15/23 0505  WBC 6.8 6.7 5.6 5.2  NEUTROABS 3.3  --   --   --   HGB 6.8* 7.7* 8.0* 7.6*  HCT 24.4* 26.4* 26.7* 25.9*  MCV 80.5 80.2 78.3* 79.4*  PLT 352 313 322 317   CBG: Recent Labs  Lab 01/13/23 1621 01/14/23 0816 01/14/23 2342 01/15/23 0642 01/15/23 0832  GLUCAP 99 97 97 99 87   Hgb A1c No results for input(s): "HGBA1C" in the last  72 hours. Lipid Profile No results for input(s): "CHOL", "HDL", "LDLCALC", "TRIG", "CHOLHDL", "LDLDIRECT" in the last 72 hours. Thyroid function studies No results for input(s): "TSH", "T4TOTAL", "T3FREE", "THYROIDAB" in the last 72 hours.  Invalid input(s): "FREET3" Urinalysis    Component Value Date/Time   COLORURINE AMBER (A) 07/27/2017 1518   APPEARANCEUR CLOUDY (A) 07/27/2017 1518   LABSPEC 1.032 (H) 07/27/2017 1518   PHURINE 5.0 07/27/2017 1518   GLUCOSEU 50 (A) 07/27/2017 1518   GLUCOSEU NEG mg/dL 16/12/9602 5409   HGBUR NEGATIVE 07/27/2017 1518   BILIRUBINUR SMALL (A) 07/27/2017 1518   BILIRUBINUR Neg 01/01/2015 1348   KETONESUR 5 (A) 07/27/2017 1518   PROTEINUR 100 (A) 07/27/2017 1518   UROBILINOGEN 2.0 01/01/2015 1348   UROBILINOGEN 0.2 04/13/2013 0714   NITRITE NEGATIVE 07/27/2017 1518   LEUKOCYTESUR TRACE (A) 07/27/2017 1518    FURTHER DISCHARGE INSTRUCTIONS:   Get Medicines reviewed and adjusted: Please take all your medications with you for your next visit with your Primary MD   Laboratory/radiological data: Please request your Primary MD to go over all hospital tests and procedure/radiological results at the follow up, please ask your Primary MD to get all Hospital records sent to his/her office.   In some cases, they will be blood work, cultures and biopsy results pending at the time of your discharge. Please request that your primary care M.D. goes through all the records of your hospital data and follows up on these results.   Also Note the following: If you experience worsening of your admission symptoms, develop shortness of breath, life threatening emergency, suicidal or homicidal thoughts you must seek medical attention immediately by calling 911 or calling your MD immediately  if symptoms less severe.   You must read complete instructions/literature along with all the possible adverse reactions/side effects for all the Medicines you take and that have  been prescribed to you. Take any new Medicines after you have completely understood and accpet all the possible adverse reactions/side effects.    Do not drive when taking Pain medications or sleeping medications (Benzodaizepines)   Do not take more than prescribed Pain, Sleep and Anxiety Medications. It is not advisable to combine anxiety,sleep and pain medications without talking with your primary care practitioner  Special Instructions: If you have smoked or chewed Tobacco  in the last 2 yrs please stop smoking, stop any regular Alcohol  and or any Recreational drug use.   Wear Seat belts while driving.   Please note: You were cared for by a hospitalist during your hospital stay. Once you are discharged, your primary care physician will handle any further medical issues. Please note that NO REFILLS for any discharge medications will be authorized once you are discharged, as it is imperative that you return to your primary care physician (or establish a relationship with a primary care physician if you do not have one) for your post hospital discharge needs so that they can reassess your need for medications and monitor your lab values.  Time coordinating discharge: 35 minutes  SIGNED:  Pamella Pert, MD, PhD 01/15/2023, 11:22 AM

## 2023-01-15 NOTE — Progress Notes (Signed)
Patient had multiple bowel movements throughout the night with the last bm as clear yellow liquid.

## 2023-01-15 NOTE — Transfer of Care (Signed)
Immediate Anesthesia Transfer of Care Note  Patient: Debra [name]  Procedure(s) Performed: COLONOSCOPY WITH PROPOFOL ESOPHAGOGASTRODUODENOSCOPY (EGD) WITH PROPOFOL POLYPECTOMY HEMOSTASIS CLIP PLACEMENT  Patient Location: PACU and Endoscopy Unit  Anesthesia Type:MAC  Level of Consciousness: sedated  Airway & Oxygen Therapy: Patient Spontanous Breathing  Post-op Assessment: Report given to RN and Post -op Vital signs reviewed and stable  Post vital signs: Reviewed and stable  Last Vitals:  Vitals Value Taken Time  BP 109/41 01/15/23 0817  Temp    Pulse 85 01/15/23 0817  Resp    SpO2 95 % 01/15/23 0817    Last Pain:  Vitals:   01/15/23 0817  TempSrc:   PainSc: 0-No pain         Complications: No notable events documented.

## 2023-01-15 NOTE — TOC Transition Note (Signed)
Transition of Care [facility]) - CM/SW Discharge Note   Patient Details  Name: Debra [name] MRN: [id number] Date of Birth: 09/01/1945  [facility]) CM/SW Contact:  Tom-Johnson, Hershal Coria, RN Phone Number: 01/15/2023, 12:48 PM   Clinical Narrative:     Patient is scheduled for discharge today.  Readmission Risk Assessment done. Hospital f/u and discharge instructions on AVS. No TOC needs or recommendations noted. Brother, Elijah Birk to transport at discharge.  No further TOC needs noted.          Final next level of care: Home/Self Care Barriers to Discharge: Barriers Resolved   Patient Goals and CMS Choice CMS Medicare.gov Compare Post Acute Care list provided to:: Patient Choice offered to / list presented to : NA  Discharge Placement                  Patient to be transferred to facility by: Brother Name of family member notified: Tom    Discharge Plan and Services Additional resources added to the After Visit Summary for                  DME Arranged: N/A DME Agency: NA       HH Arranged: NA HH Agency: NA        Social Determinants of Health (SDOH) Interventions SDOH Screenings   Food Insecurity: No Food Insecurity (01/13/2023)  Housing: Low Risk  (01/13/2023)  Transportation Needs: No Transportation Needs (01/13/2023)  Utilities: Not At Risk (01/13/2023)  Depression (PHQ2-9): Low Risk  (07/10/2019)  Financial Resource Strain: Low Risk  (01/11/2023)   Received from [facility]  Physical Activity: Unknown (03/05/2022)   Received from [facility], [facility]  Social Connections: Socially Integrated (03/05/2022)   Received from [facility], [facility]  Stress: No Stress Concern Present (03/05/2022)   Received from [facility], [facility]  Tobacco Use: Low Risk  (01/13/2023)   Received from [facility]     Readmission Risk Interventions    01/14/2023    4:35 PM  Readmission Risk Prevention Plan  Post  Dischage Appt Complete  Medication Screening Complete  Transportation Screening Complete

## 2023-01-15 NOTE — Discharge Instructions (Signed)
Follow with Shepperson, Kirstin, PA-C in 5-7 days  Please get a complete blood count and chemistry panel checked by your Primary MD at your next visit, and again as instructed by your Primary MD. Please get your medications reviewed and adjusted by your Primary MD.  Please request your Primary MD to go over all Hospital Tests and Procedure/Radiological results at the follow up, please get all Hospital records sent to your Prim MD by signing hospital release before you go home.  In some cases, there will be blood work, cultures and biopsy results pending at the time of your discharge. Please request that your primary care M.D. goes through all the records of your hospital data and follows up on these results.  If you had Pneumonia of Lung problems at the Hospital: Please get a 2 view Chest X ray done in 6-8 weeks after hospital discharge or sooner if instructed by your Primary MD.  If you have Congestive Heart Failure: Please call your Cardiologist or Primary MD anytime you have any of the following symptoms:  1) 3 pound weight gain in 24 hours or 5 pounds in 1 week  2) shortness of breath, with or without a dry hacking cough  3) swelling in the hands, feet or stomach  4) if you have to sleep on extra pillows at night in order to breathe  Follow cardiac low salt diet and 1.5 lit/day fluid restriction.  If you have diabetes Accuchecks 4 times/day, Once in AM empty stomach and then before each meal. Log in all results and show them to your primary doctor at your next visit. If any glucose reading is under 80 or above 300 call your primary MD immediately.  If you have Seizure/Convulsions/Epilepsy: Please do not drive, operate heavy machinery, participate in activities at heights or participate in high speed sports until you have seen by Primary MD or a Neurologist and advised to do so again. Per [facility] statutes, patients with seizures are not allowed to drive until they have been  seizure-free for six months.  Use caution when using heavy equipment or power tools. Avoid working on ladders or at heights. Take showers instead of baths. Ensure the water temperature is not too high on the home water heater. Do not go swimming alone. Do not lock yourself in a room alone (i.e. bathroom). When caring for infants or small children, sit down when holding, feeding, or changing them to minimize risk of injury to the child in the event you have a seizure. Maintain good sleep hygiene. Avoid alcohol.   If you had Gastrointestinal Bleeding: Please ask your Primary MD to check a complete blood count within one week of discharge or at your next visit. Your endoscopic/colonoscopic biopsies that are pending at the time of discharge, will also need to followed by your Primary MD.  Get Medicines reviewed and adjusted. Please take all your medications with you for your next visit with your Primary MD  Please request your Primary MD to go over all hospital tests and procedure/radiological results at the follow up, please ask your Primary MD to get all Hospital records sent to his/her office.  If you experience worsening of your admission symptoms, develop shortness of breath, life threatening emergency, suicidal or homicidal thoughts you must seek medical attention immediately by calling 911 or calling your MD immediately  if symptoms less severe.  You must read complete instructions/literature along with all the possible adverse reactions/side effects for all the Medicines you take  and that have been prescribed to you. Take any new Medicines after you have completely understood and accpet all the possible adverse reactions/side effects.   Do not drive or operate heavy machinery when taking Pain medications.   Do not take more than prescribed Pain, Sleep and Anxiety Medications  Special Instructions: If you have smoked or chewed Tobacco  in the last 2 yrs please stop smoking, stop any regular  Alcohol  and or any Recreational drug use.  Wear Seat belts while driving.  Please note You were cared for by a hospitalist during your hospital stay. If you have any questions about your discharge medications or the care you received while you were in the hospital after you are discharged, you can call the unit and asked to speak with the hospitalist on call if the hospitalist that took care of you is not available. Once you are discharged, your primary care physician will handle any further medical issues. Please note that NO REFILLS for any discharge medications will be authorized once you are discharged, as it is imperative that you return to your primary care physician (or establish a relationship with a primary care physician if you do not have one) for your aftercare needs so that they can reassess your need for medications and monitor your lab values.  You can reach the hospitalist office at phone [phone] or fax [phone]   If you do not have a primary care physician, you can call [phone] for a physician referral.  Activity: As tolerated with Full fall precautions use walker/cane & assistance as needed    Diet: regular  Disposition Home

## 2023-01-15 NOTE — Anesthesia Postprocedure Evaluation (Signed)
Anesthesia Post Note  Patient: Debra [name]  Procedure(s) Performed: COLONOSCOPY WITH PROPOFOL ESOPHAGOGASTRODUODENOSCOPY (EGD) WITH PROPOFOL POLYPECTOMY HEMOSTASIS CLIP PLACEMENT     Patient location during evaluation: PACU Anesthesia Type: MAC Level of consciousness: awake and alert Pain management: pain level controlled Vital Signs Assessment: post-procedure vital signs reviewed and stable Respiratory status: spontaneous breathing, nonlabored ventilation and respiratory function stable Cardiovascular status: blood pressure returned to baseline and stable Postop Assessment: no apparent nausea or vomiting Anesthetic complications: no   No notable events documented.  Last Vitals:  Vitals:   01/15/23 0830 01/15/23 0908  BP: (!) 141/73 (!) 135/101  Pulse: 85 78  Resp: 19 20  Temp:  36.7 C  SpO2: 97% 100%    Last Pain:  Vitals:   01/15/23 0900  TempSrc:   PainSc: 0-No pain                 Lowella Curb

## 2023-01-15 NOTE — Progress Notes (Signed)
DISCHARGE NOTE HOME Debra [name] to be discharged Home per MD order. Discussed prescriptions and follow up appointments with the patient. Prescriptions given to patient; medication list explained in detail. Patient verbalized understanding.  Skin clean, dry and intact without evidence of skin break down, no evidence of skin tears noted. IV catheter discontinued intact. Site without signs and symptoms of complications. Dressing and pressure applied. Pt denies pain at the site currently. No complaints noted.  Patient free of lines, drains, and wounds.   An After Visit Summary (AVS) was printed and given to the patient. Patient escorted via wheelchair, and discharged home via private auto.  [name], RN

## 2023-01-15 NOTE — Op Note (Signed)
[facility] Patient Name: Debra [name] Procedure Date : 01/15/2023 MRN: [id number] Attending MD: Willaim Rayas. Adela Lank , MD, [id number] Date of Birth: January 29, 1946 CSN: [id number] Age: 77 Admit Type: Inpatient Procedure:                Colonoscopy Indications:              Heme positive stool, Iron deficiency anemia Providers:                Viviann Spare P. Adela Lank, MD, Jacquelyn "Jaci" Clelia Croft,                            RN, Priscella Mann, Technician Referring MD:              Medicines:                Monitored Anesthesia Care Complications:            No immediate complications. Estimated blood loss:                            Minimal. Estimated Blood Loss:     Estimated blood loss was minimal. Procedure:                Pre-Anesthesia Assessment:                           - Prior to the procedure, a History and Physical                            was performed, and patient medications and                            allergies were reviewed. The patient's tolerance of                            previous anesthesia was also reviewed. The risks                            and benefits of the procedure and the sedation                            options and risks were discussed with the patient.                            All questions were answered, and informed consent                            was obtained. Prior Anticoagulants: The patient has                            taken no anticoagulant or antiplatelet agents. ASA                            Grade Assessment: III - A patient with severe  systemic disease. After reviewing the risks and                            benefits, the patient was deemed in satisfactory                            condition to undergo the procedure.                           After obtaining informed consent, the colonoscope                            was passed under direct vision. Throughout the                             procedure, the patient's blood pressure, pulse, and                            oxygen saturations were monitored continuously. The                            CF-HQ190L ([id number] Olympus coloscope was                            introduced through the anus and advanced to the the                            terminal ileum, with identification of the                            appendiceal orifice and IC valve. The colonoscopy                            was performed without difficulty. The patient                            tolerated the procedure well. The quality of the                            bowel preparation was good. The terminal ileum,                            ileocecal valve, appendiceal orifice, and rectum                            were photographed. Scope In: 7:51:45 AM Scope Out: 8:10:26 AM Scope Withdrawal Time: 0 hours 14 minutes 23 seconds  Total Procedure Duration: 0 hours 18 minutes 41 seconds  Findings:      The perianal and digital rectal examinations were normal.      The terminal ileum appeared normal.      Scattered small-mouthed diverticula were found in the transverse colon       and left colon.      A 3 mm polyp was found in the sigmoid colon. The polyp was sessile.  The       polyp was removed with a cold snare. Resection and retrieval were       complete.      Internal hemorrhoids were found during retroflexion.      The exam was otherwise without abnormality. Impression:               - The examined portion of the ileum was normal.                           - Diverticulosis in the transverse colon and in the                            left colon.                           - One 3 mm polyp in the sigmoid colon, removed with                            a cold snare. Resected and retrieved.                           - Internal hemorrhoids.                           - The examination was otherwise normal.                           No cause for anemia on  colonoscopy Recommendation:           - Return to hospital ward                           - Resume previous diet.                           - Continue present medications.                           - Await pathology results.                           - Okay for discharge home - see EGD note for full                            recommendations Procedure Code(s):        --- Professional ---                           [phone], Colonoscopy, flexible; with removal of                            tumor(s), polyp(s), or other lesion(s) by snare                            technique Diagnosis Code(s):        --- Professional ---  K64.8, Other hemorrhoids                           D12.5, Benign neoplasm of sigmoid colon                           R19.5, Other fecal abnormalities                           D50.9, Iron deficiency anemia, unspecified                           K57.30, Diverticulosis of large intestine without                            perforation or abscess without bleeding CPT copyright 2022 American Medical Association. All rights reserved. The codes documented in this report are preliminary and upon coder review may  be revised to meet current compliance requirements. Viviann Spare P. Adela Lank, MD 01/15/2023 8:21:25 AM This report has been signed electronically. Number of Addenda: 0

## 2023-01-15 NOTE — Anesthesia Preprocedure Evaluation (Signed)
Anesthesia Evaluation  Patient identified by MRN, date of birth, ID band Patient awake    Reviewed: Allergy & Precautions, NPO status , Patient's Chart, lab work & pertinent test results  Airway Mallampati: I  TM Distance: >3 FB Neck ROM: Full    Dental  (+) Missing, Poor Dentition,    Pulmonary neg pulmonary ROS   Pulmonary exam normal breath sounds clear to auscultation       Cardiovascular hypertension, Pt. on medications + Peripheral Vascular Disease  Normal cardiovascular exam Rhythm:Regular Rate:Normal  Chronic venous insufficiency   Neuro/Psych negative neurological ROS  negative psych ROS   GI/Hepatic Neg liver ROS,GERD  Controlled and Medicated,,H/o SBO    Endo/Other  diabetes, Insulin Dependent, Oral Hypoglycemic Agents  Morbid obesity  Renal/GU negative Renal ROS     Musculoskeletal  (+) Arthritis , Osteoarthritis,    Abdominal  (+) + obese  Peds  Hematology  (+) Blood dyscrasia, anemia HLD   Anesthesia Other Findings ventral hernia  Reproductive/Obstetrics                             Anesthesia Physical Anesthesia Plan  ASA: III  Anesthesia Plan: MAC   Post-op Pain Management: GA combined w/ Regional for post-op pain and Minimal or no pain anticipated   Induction: Intravenous  PONV Risk Score and Plan: 2 and Treatment may vary due to age or medical condition and Propofol infusion  Airway Management Planned: Nasal Cannula  Additional Equipment:   Intra-op Plan:   Post-operative Plan:   Informed Consent: I have reviewed the patients History and Physical, chart, labs and discussed the procedure including the risks, benefits and alternatives for the proposed anesthesia with the patient or authorized representative who has indicated his/her understanding and acceptance.     Dental advisory given  Plan Discussed with: CRNA, Anesthesiologist and  Surgeon  Anesthesia Plan Comments: ( )        Anesthesia Quick Evaluation

## 2023-01-15 NOTE — Op Note (Signed)
[facility] Patient Name: Debra [name] Date : 01/15/2023 MRN: [id number] Attending MD: Willaim Rayas. Adela Lank , MD, [id number] Date of Birth: May 13, 1945 CSN: [id number] Age: 77 Admit Type: Inpatient [name]                Upper GI endoscopy Indications:              Iron deficiency anemia, Heme positive stool -                            history of NSAID use Providers:                Willaim Rayas. Adela Lank, MD, Jacquelyn "Jaci" Clelia Croft,                            RN, Priscella Mann, Technician Referring MD:              Medicines:                Monitored Anesthesia Care Complications:            No immediate complications. Estimated blood loss:                            Minimal. Estimated Blood Loss:     Estimated blood loss was minimal. [name]                Pre-Anesthesia Assessment:                           - Prior to the [name], a History and Physical                            was performed, and patient medications and                            allergies were reviewed. The patient's tolerance of                            previous anesthesia was also reviewed. The risks                            and benefits of the [name] and the sedation                            options and risks were discussed with the patient.                            All questions were answered, and informed consent                            was obtained. Prior Anticoagulants: The patient has                            taken no anticoagulant or antiplatelet agents. ASA  Grade Assessment: III - A patient with severe                            systemic disease. After reviewing the risks and                            benefits, the patient was deemed in satisfactory                            condition to undergo the [name].                           After obtaining informed consent, the endoscope was                            passed under  direct vision. Throughout the                            [name], the patient's blood pressure, pulse, and                            oxygen saturations were monitored continuously. The                            [id number]-H190 ([id number] Olympus endoscope was introduced                            through the mouth, and advanced to the second part                            of duodenum. The upper GI endoscopy was                            accomplished without difficulty. The patient                            tolerated the [name] well. Scope In: Scope Out: Findings:      Esophagogastric landmarks were identified: the Z-line was found at 37 cm       from the incisors.      A small hiatal hernia was present.      The exam of the esophagus was otherwise normal.      A single 6 mm sessile polyp was found in the gastric antrum. The lesion       had an area of small ulceration. The polyp was removed with a hot snare.       Resection and retrieval were complete. To prevent bleeding after the       polypectomy, two hemostatic clips were successfully placed to close the       polypectomy site given her recent severe anemia.      The exam of the stomach was otherwise normal.      The examined duodenum was normal. Impression:               - Esophagogastric landmarks identified.                           -  Small hiatal hernia.                           - Normal esophagus otherwise.                           - A single gastric polyp. Resected and retrieved.                            Clips were placed.                           - Normal stomach otherwise.                           - Normal examined duodenum.                           Ulcerated gastric polyp could be related to iron                            deficiency. No cause on colonoscopy. Would await                            course post polypectomy, iron supplementation. If                            her anemia persists despite iron and  removal of the                            polyp can do capsule endoscopy as outpatient. Recommendation:           - Return patient to hospital ward for ongoing care.                           - Advance diet as tolerated.                           - Continue present medications.                           - Take omeprazole home dose of 40mg  twice daily for                            the next 2 weeks to reduce risk for post                            polypectomy bleeding                           - Await pathology results.                           - Would give dose of IV iron while hospitalized                           -  Okay for discharge home today                           - Repeat CBC in one week post discharge - our                            office can coordinate                           - Call with questions, will sign off for now [name] Code(s):        --- Professional ---                           [phone], Esophagogastroduodenoscopy, flexible,                            transoral; with removal of tumor(s), polyp(s), or                            other lesion(s) by snare technique Diagnosis Code(s):        --- Professional ---                           K44.9, Diaphragmatic hernia without obstruction or                            gangrene                           K31.7, Polyp of stomach and duodenum                           D50.9, Iron deficiency anemia, unspecified                           R19.5, Other fecal abnormalities CPT copyright 2022 American Medical Association. All rights reserved. The codes documented in this report are preliminary and upon coder review may  be revised to meet current compliance requirements. Viviann Spare P. Akyla Vavrek, MD 01/15/2023 8:17:16 AM This report has been signed electronically. Number of Addenda: 0

## 2023-01-15 NOTE — Interval H&P Note (Signed)
History and Physical Interval Note: PAtient here for EGD and colonoscopy - did another prep overnight and states she is clear and ready for the exam. Hgb in the 7s, relatively stable, she has not seen any blood come out with the prep. She has heme positive stools with severe IDA and NSAID use. I have discussed risks / benefits and she understands and wants to proceed. Further recommendations pending the results.   01/15/2023 7:10 AM  [name]  has presented today for surgery, with the diagnosis of Iron deficiency Anemia.  The various methods of treatment have been discussed with the patient and family. After consideration of risks, benefits and other options for treatment, the patient has consented to  Procedure(s): COLONOSCOPY WITH PROPOFOL (N/A) ESOPHAGOGASTRODUODENOSCOPY (EGD) WITH PROPOFOL (N/A) as a surgical intervention.  The patient's history has been reviewed, patient examined, no change in status, stable for surgery.  I have reviewed the patient's chart and labs.  Questions were answered to the patient's satisfaction.     Debra [name]

## 2023-01-18 ENCOUNTER — Telehealth: Payer: Self-pay | Admitting: Gastroenterology

## 2023-01-18 ENCOUNTER — Encounter: Payer: Self-pay | Admitting: Gastroenterology

## 2023-01-18 ENCOUNTER — Telehealth: Payer: Self-pay

## 2023-01-18 ENCOUNTER — Other Ambulatory Visit: Payer: Self-pay

## 2023-01-18 DIAGNOSIS — D509 Iron deficiency anemia, unspecified: Secondary | ICD-10-CM

## 2023-01-18 LAB — SURGICAL PATHOLOGY

## 2023-01-18 NOTE — Telephone Encounter (Signed)
Debra [name], to clarify for her, she is a patient of Dr. Marina Goodell.   She had an EGD and colonoscopy done during her inpatient stay for IDA. Ulcerated gastric polyp noted and removed could be related to iron deficiency. No cause on colonoscopy. Recommended awaiting course post polypectomy, continue iron supplementation. If her anemia persists despite iron and removal of the polyp can do capsule endoscopy as outpatient.  I had recommended a repeat CBC 1 weeks post hospital discharge, not an urgent follow up office visit, and continue omeprazole 40mg  BID for the next 2 weeks post gastric polypectomy  She does not need an urgent visit but would book her for a routine office follow up with Dr. Marina Goodell or APP to follow this up. If she does not improve her iron levels / CBC over time would then proceed with capsule study, but we won't make that decision for several weeks. If her blood counts normalize over time we likely do not need to do anything further. Thanks

## 2023-01-18 NOTE — Telephone Encounter (Signed)
Inbound call from patient, states she saw Dr. Adela Lank in the hospital. Patient was told she needed an appointment ASAP, but nothing available until February. Patient is also inquiring about colonoscopy results.

## 2023-01-18 NOTE — Telephone Encounter (Signed)
-----   Message from Benancio Deeds sent at 01/15/2023  8:24 AM EDT ----- Regarding: outpatient follow up This is a Dr. Marina Goodell patient - needs routine nonurgent outpatient office follow up with him or APP for IDA, hospitalization.  She needs a CBC in one week - can place it under my name for review. Thanks  Dr. Mervyn Skeeters

## 2023-01-18 NOTE — Telephone Encounter (Signed)
Patient recently hospitalized with GI bleed. Endoscopy was completed; biopsies pending. Patient has since been discharged. Per discharge summary, GI is to get additional labwork on pt. Please advise on specific labs needed and timing.

## 2023-01-18 NOTE — Telephone Encounter (Signed)
Patient is advised to come for CBC 01/15/23 at [facility] building basement lab for follow up of gi bleed. She is also advised to continue omeprazole twice daily x 2 week and that she will need follow up with Dr Kizzie Furnish on 03/25/23 at 340 pm. Patient states she has written this information down. She is also advised that if her labs do not improve with iron supplementation  etc, we may need additional testing. Patient states that she is unable to take oral iron as it "gives me hives."

## 2023-01-18 NOTE — Telephone Encounter (Signed)
Spoke with pt and she states she will come for lab on Wednesday. Pt scheduled to see Dr. Marina Goodell 03/25/23 at 3:40pm. Pt aware of appt.

## 2023-01-19 ENCOUNTER — Encounter (HOSPITAL_COMMUNITY): Payer: Self-pay | Admitting: Gastroenterology

## 2023-01-20 ENCOUNTER — Other Ambulatory Visit (INDEPENDENT_AMBULATORY_CARE_PROVIDER_SITE_OTHER): Payer: Medicare Other

## 2023-01-20 ENCOUNTER — Telehealth: Payer: Self-pay | Admitting: Gastroenterology

## 2023-01-20 DIAGNOSIS — D509 Iron deficiency anemia, unspecified: Secondary | ICD-10-CM

## 2023-01-20 LAB — CBC WITH DIFFERENTIAL/PLATELET
Basophils Absolute: 0.1 10*3/uL (ref 0.0–0.1)
Basophils Relative: 0.9 % (ref 0.0–3.0)
Eosinophils Absolute: 0.6 10*3/uL (ref 0.0–0.7)
Eosinophils Relative: 8.2 % — ABNORMAL HIGH (ref 0.0–5.0)
HCT: 28.8 % — ABNORMAL LOW (ref 36.0–46.0)
Hemoglobin: 8.6 g/dL — ABNORMAL LOW (ref 12.0–15.0)
Lymphocytes Relative: 34.7 % (ref 12.0–46.0)
Lymphs Abs: 2.4 10*3/uL (ref 0.7–4.0)
MCHC: 29.8 g/dL — ABNORMAL LOW (ref 30.0–36.0)
MCV: 79 fL (ref 78.0–100.0)
Monocytes Absolute: 0.8 10*3/uL (ref 0.1–1.0)
Monocytes Relative: 11.1 % (ref 3.0–12.0)
Neutro Abs: 3.2 10*3/uL (ref 1.4–7.7)
Neutrophils Relative %: 45.1 % (ref 43.0–77.0)
Platelets: 325 10*3/uL (ref 150.0–400.0)
RBC: 3.65 Mil/uL — ABNORMAL LOW (ref 3.87–5.11)
RDW: 23.2 % — ABNORMAL HIGH (ref 11.5–15.5)
WBC: 7 10*3/uL (ref 4.0–10.5)

## 2023-01-20 NOTE — Telephone Encounter (Signed)
Inbound call from patient requesting a call to discuss 10/25 colonoscopy results. Please advise, thank you.

## 2023-01-20 NOTE — Telephone Encounter (Signed)
Patient calling to get pathology results from recent endoscopy/colonoscopy. Patient is advised that she should be receiving a letter from Dr Adela Lank over the next few days with written resutls as a letter was sent on 01/18/23. I have gone over this letter verbally with patient and she verbalizes understanding of information provided.

## 2023-01-21 ENCOUNTER — Other Ambulatory Visit: Payer: Self-pay

## 2023-01-21 DIAGNOSIS — D509 Iron deficiency anemia, unspecified: Secondary | ICD-10-CM

## 2023-01-21 MED ORDER — FERROUS SULFATE 325 (65 FE) MG PO TABS: 325.0000 mg | ORAL_TABLET | Freq: Every day | ORAL | 3 refills | Status: AC

## 2023-02-11 ENCOUNTER — Other Ambulatory Visit: Payer: Self-pay

## 2023-02-11 ENCOUNTER — Telehealth: Payer: Self-pay

## 2023-02-11 DIAGNOSIS — D509 Iron deficiency anemia, unspecified: Secondary | ICD-10-CM

## 2023-02-11 NOTE — Telephone Encounter (Signed)
Pt states she will come tomorrow for the labs.

## 2023-02-11 NOTE — Telephone Encounter (Signed)
-----   Message from Nurse Kerrie Buffalo sent at 01/21/2023 10:57 AM EDT ----- Regarding: CBC Pt needs CBC in 3 weeks, order in epic

## 2023-02-12 ENCOUNTER — Other Ambulatory Visit (INDEPENDENT_AMBULATORY_CARE_PROVIDER_SITE_OTHER): Payer: Medicare Other

## 2023-02-12 DIAGNOSIS — D509 Iron deficiency anemia, unspecified: Secondary | ICD-10-CM | POA: Diagnosis not present

## 2023-02-12 LAB — CBC WITH DIFFERENTIAL/PLATELET
Basophils Absolute: 0.1 10*3/uL (ref 0.0–0.1)
Basophils Relative: 1 % (ref 0.0–3.0)
Eosinophils Absolute: 0.5 10*3/uL (ref 0.0–0.7)
Eosinophils Relative: 7.4 % — ABNORMAL HIGH (ref 0.0–5.0)
HCT: 30.9 % — ABNORMAL LOW (ref 36.0–46.0)
Hemoglobin: 9.7 g/dL — ABNORMAL LOW (ref 12.0–15.0)
Lymphocytes Relative: 34.9 % (ref 12.0–46.0)
Lymphs Abs: 2.2 10*3/uL (ref 0.7–4.0)
MCHC: 31.3 g/dL (ref 30.0–36.0)
MCV: 81.9 fL (ref 78.0–100.0)
Monocytes Absolute: 0.7 10*3/uL (ref 0.1–1.0)
Monocytes Relative: 11.8 % (ref 3.0–12.0)
Neutro Abs: 2.8 10*3/uL (ref 1.4–7.7)
Neutrophils Relative %: 44.9 % (ref 43.0–77.0)
Platelets: 272 10*3/uL (ref 150.0–400.0)
RBC: 3.78 Mil/uL — ABNORMAL LOW (ref 3.87–5.11)
RDW: 24.9 % — ABNORMAL HIGH (ref 11.5–15.5)
WBC: 6.3 10*3/uL (ref 4.0–10.5)

## 2023-02-16 ENCOUNTER — Telehealth: Payer: Self-pay | Admitting: Internal Medicine

## 2023-02-16 NOTE — Telephone Encounter (Signed)
Inbound call from patient requesting a call back to discuss lab results. Please advise.  Requesting a call back at [phone]

## 2023-02-16 NOTE — Telephone Encounter (Signed)
Had GI work-up in hospital.  Please let the patient know that her blood counts are improving.  Make sure that she is on daily iron (twice daily would be best, if tolerated).  She should have follow up CBC with her PCP in one month, and thereafter as indicated.  Thanks,  Dr. Concha Norway with pt and she is aware.

## 2023-02-16 NOTE — Telephone Encounter (Signed)
PT returning call on update for lab results. Please advise.

## 2023-03-25 ENCOUNTER — Ambulatory Visit: Payer: Medicare Other | Admitting: Internal Medicine

## 2023-03-26 ENCOUNTER — Ambulatory Visit: Payer: Medicare Other | Admitting: Internal Medicine

## 2023-04-14 ENCOUNTER — Ambulatory Visit: Payer: Medicare Other | Admitting: Internal Medicine

## 2023-06-23 ENCOUNTER — Encounter: Payer: Self-pay | Admitting: Family

## 2023-06-23 ENCOUNTER — Ambulatory Visit: Admitting: Family

## 2023-06-23 DIAGNOSIS — M170 Bilateral primary osteoarthritis of knee: Secondary | ICD-10-CM

## 2023-06-23 DIAGNOSIS — M1712 Unilateral primary osteoarthritis, left knee: Secondary | ICD-10-CM

## 2023-06-23 MED ORDER — LIDOCAINE HCL 1 % IJ SOLN
5.0000 mL | INTRAMUSCULAR | Status: AC | PRN
  Administered 2023-06-23: 5 mL

## 2023-06-23 MED ORDER — METHYLPREDNISOLONE ACETATE 40 MG/ML IJ SUSP
40.0000 mg | INTRAMUSCULAR | Status: AC | PRN
  Administered 2023-06-23: 40 mg via INTRA_ARTICULAR

## 2023-06-23 NOTE — Progress Notes (Signed)
 Office Visit Note   Patient: Debra [name]           Date of Birth: January 01, 1946           MRN: [id number] Visit Date: 06/23/2023              Requested by: Julien Girt, PA-C [id number] [place],  [place] [address] PCP: Julien Girt, PA-C  Chief Complaint  Patient presents with   Left Knee - Follow-up    Requesting bilateral cortisone injections   Right Knee - Follow-up      HPI: The patient is a 78 year old woman who presents today for bilateral knee injection due to her chronic knee pain she has significant pain left knee worse than right with valgus deformity of the left knee last Depo-Medrol injection of the left knee was in August of last year last time right knee was injected was April of last year  Assessment & Plan: Visit Diagnoses: No diagnosis found.  Plan: Depo-Medrol injection bilateral knees.  Patient tolerated well.  Follow-up as needed.  Follow-Up Instructions: Return in about 4 weeks (around 07/21/2023), or if symptoms worsen or fail to improve.   Ortho Exam  Patient is alert, oriented, no adenopathy, well-dressed, normal affect, normal respiratory effort. On examination the patient has an antalgic gait with hyperextension and valgus to for many of the left knee slight varus alignment of the right knee.  She is globally tender to the left knee.  Laxity of the collateral ligaments left knee.  Crepitation with range of motion bilaterally.  Imaging: No results found. No images are attached to the encounter.  Labs: Lab Results  Component Value Date   HGBA1C 8.6 (H) 07/22/2019   HGBA1C 8.9 (H) 07/27/2017   HGBA1C 7.6 (H) 05/22/2016   ESRSEDRATE 36 (H) 11/20/2019   ESRSEDRATE 60 (H) 07/28/2019   ESRSEDRATE 22 07/10/2019   CRP 10.9 (H) 11/20/2019   CRP 10.1 (H) 07/28/2019   CRP 3.8 07/10/2019   LABURIC 5.5 11/29/2014   LABURIC 4.6 10/01/2014   LABURIC 5.9 06/09/2013   REPTSTATUS 07/30/2019 FINAL 07/26/2019   GRAMSTAIN   07/26/2019    ABUNDANT WBC PRESENT,BOTH PMN AND MONONUCLEAR ABUNDANT GRAM POSITIVE COCCI FEW GRAM NEGATIVE RODS    CULT  07/26/2019    ABUNDANT GROUP B STREP(S.AGALACTIAE)ISOLATED TESTING AGAINST S. AGALACTIAE NOT ROUTINELY PERFORMED DUE TO PREDICTABILITY OF AMP/PEN/VAN SUSCEPTIBILITY. FEW STAPHYLOCOCCUS HAEMOLYTICUS ABUNDANT BACTEROIDES FRAGILIS BETA LACTAMASE POSITIVE Performed at [facility], [address] [place], [place] [address]    [facility] STAPHYLOCOCCUS HAEMOLYTICUS 07/26/2019     Lab Results  Component Value Date   ALBUMIN 2.8 (L) 01/12/2023   ALBUMIN 3.2 (L) 07/22/2019   ALBUMIN 2.4 (L) 08/02/2017    Lab Results  Component Value Date   MG 1.2 (L) 08/02/2017   MG 1.6 (L) 07/29/2017   MG 1.3 (L) 07/27/2017   Lab Results  Component Value Date   VD25OH 28.7 (L) 11/28/2012    No results found for: "PREALBUMIN"    Latest Ref Rng & Units 02/12/2023   10:55 AM 01/20/2023   10:44 AM 01/15/2023    5:05 AM  CBC EXTENDED  WBC 4.0 - 10.5 K/uL 6.3  7.0  5.2   RBC 3.87 - 5.11 Mil/uL 3.78  3.65  3.26   Hemoglobin 12.0 - 15.0 g/dL 9.7  8.6 Repeated and verified X2.  7.6   HCT 36.0 - 46.0 % 30.9  28.8  25.9   Platelets 150.0 -  400.0 K/uL 272.0  325.0  317   NEUT# 1.4 - 7.7 K/uL 2.8  3.2    Lymph# 0.7 - 4.0 K/uL 2.2  2.4       There is no height or weight on file to calculate BMI.  Orders:  No orders of the defined types were placed in this encounter.  No orders of the defined types were placed in this encounter.    Procedures: Large Joint Inj: bilateral knee on 06/23/2023 1:09 PM Indications: pain Details: 18 G 1.5 in needle, anteromedial approach Medications (Right): 5 mL lidocaine 1 %; 40 mg methylPREDNISolone acetate 40 MG/ML Medications (Left): 5 mL lidocaine 1 %; 40 mg methylPREDNISolone acetate 40 MG/ML Consent was given by the patient.      Clinical Data: No additional findings.  ROS:  All other systems negative, except as noted in the  HPI. Review of Systems  Constitutional: Negative.   Musculoskeletal:  Positive for arthralgias. Negative for joint swelling.    Objective: Vital Signs: LMP 07/02/1973   Specialty Comments:  No specialty comments available.  PMFS History: Patient Active Problem List   Diagnosis Date Noted   Gastric polyp 01/15/2023   Benign neoplasm of colon 01/15/2023   Acute GI bleeding 01/13/2023   HLD (hyperlipidemia) 01/13/2023   Type 2 diabetes mellitus (HCC) 01/13/2023   Upper GI bleed 01/13/2023   Symptomatic anemia 01/13/2023   Iron deficiency anemia 01/13/2023   Heme positive stool 01/13/2023   Cutaneous abscess of left foot    Osteomyelitis (HCC) 07/22/2019   Diabetic foot infection (HCC) 07/10/2019   Acute osteomyelitis of left calcaneus (HCC) 07/10/2019   Group B streptococcal infection 07/10/2019   Bacterial infection due to Morganella morganii 07/10/2019   Pseudogout of right knee 08/02/2017   SBO (small bowel obstruction) (HCC) 07/22/2017   Abnormal LFTs 03/20/2016   Abdominal pain, left lower quadrant 07/04/2015   Type 2 diabetes mellitus with diabetic polyneuropathy (HCC) 10/06/2013   Hyperlipidemia associated with type 2 diabetes mellitus (HCC) 10/06/2013   Fungal dermatitis 08/11/2013   Hyperlipidemia 06/09/2013   Choledocholithiasis with acute cholecystitis 04/14/2013   Symptomatic cholelithiasis 04/13/2013   Neuropathic pain of both legs 04/04/2013   Severe obesity (BMI >= 40) (HCC) 11/28/2012   Obesity (BMI 30-39.9) 11/28/2012   Diabetes mellitus with neurological manifestation (HCC)    Hypertension goal BP (blood pressure) < 130/80    Osteopenia    Chronic venous insufficiency    Leg edema    Hypertensive retinopathy, grade 2 03/30/2012   Medial epicondylitis 02/11/2012   Superficial thrombophlebitis of left leg 07/15/2011   GERD (gastroesophageal reflux disease) 02/25/2011   Right knee pain 02/16/2011   Preventive measure 09/11/2010   Neck pain 07/03/2010    Grieving 04/17/2010   OSTEOPENIA 04/12/2006   Controlled type 2 diabetes mellitus with complication, with long-term current use of insulin (HCC) 03/08/2006   MORBID OBESITY 03/08/2006   Essential hypertension 03/08/2006   Past Medical History:  Diagnosis Date   Acute osteomyelitis of calcaneum, left (HCC) 07/10/2019   Cellulitis of left leg    history of, most recent episode 01/09   Chronic venous insufficiency    Diabetes mellitus with neurological manifestation (HCC)    Diabetic foot infection (HCC) 07/10/2019   Diverticulitis    h/o 2-3 episodes in past   Group B streptococcal infection 07/10/2019   Hyperlipidemia LDL goal < 100    Hypertension goal BP (blood pressure) < 130/80    Leg edema    secondary  to chronic venous insufficiency   Morbid obesity (HCC)    Osteopenia    DEXA scan 7/07   Pseudomonas aeruginosa infection 07/10/2019    Family History  Problem Relation Age of Onset   Heart disease Mother    Heart disease Father    Heart disease Brother    Cancer Brother    Cancer Brother    Hypertension Sister    Heart disease Sister     Past Surgical History:  Procedure Laterality Date   ABDOMINAL HYSTERECTOMY     APPENDECTOMY     CHOLECYSTECTOMY     CHOLECYSTECTOMY N/A 04/14/2013   Procedure: LAPAROSCOPIC CHOLECYSTECTOMY WITH INTRAOPERATIVE CHOLANGIOGRAM;  Surgeon: Ernestene Mention, MD;  Location: [facility];  Service: General;  Laterality: N/A;   COLECTOMY     with diverting colsotmy and revision in the 1990's for diverticulitis   COLONOSCOPY WITH PROPOFOL N/A 01/15/2023   Procedure: COLONOSCOPY WITH PROPOFOL;  Surgeon: Benancio Deeds, MD;  Location: [facility] ENDOSCOPY;  Service: Gastroenterology;  Laterality: N/A;   ERCP N/A 04/17/2013   Procedure: ENDOSCOPIC RETROGRADE CHOLANGIOPANCREATOGRAPHY (ERCP);  Surgeon: Hilarie Fredrickson, MD;  Location: [facility];  Service: Endoscopy;  Laterality: N/A;  note pt want general anesthesia for this case.  preferto perform in endo  unit, but if unavailable please arrange time in OR   ESOPHAGOGASTRODUODENOSCOPY (EGD) WITH PROPOFOL N/A 01/15/2023   Procedure: ESOPHAGOGASTRODUODENOSCOPY (EGD) WITH PROPOFOL;  Surgeon: Benancio Deeds, MD;  Location: [facility] ENDOSCOPY;  Service: Gastroenterology;  Laterality: N/A;   EYE SURGERY  8/13   left cataract removal & retinal repair   HEMOSTASIS CLIP PLACEMENT  01/15/2023   Procedure: HEMOSTASIS CLIP PLACEMENT;  Surgeon: Benancio Deeds, MD;  Location: [facility] ENDOSCOPY;  Service: Gastroenterology;;   I & D EXTREMITY Left 07/26/2019   Procedure: LEFT PARTIAL CALCANEOUS EXCISION;  Surgeon: Nadara Mustard, MD;  Location: [facility] OR;  Service: Orthopedics;  Laterality: Left;   INSERTION OF MESH N/A 07/30/2017   Procedure: INSERTION OF MESH;  Surgeon: Griselda Miner, MD;  Location: [facility];  Service: General;  Laterality: N/A;   LAPAROSCOPIC LYSIS OF ADHESIONS N/A 04/14/2013   Procedure: LAPAROSCOPIC LYSIS OF ADHESIONS;  Surgeon: Ernestene Mention, MD;  Location: [facility];  Service: General;  Laterality: N/A;   LAPAROTOMY N/A 07/30/2017   Procedure: EXPLORATORY LAPAROTOMY;  Surgeon: Griselda Miner, MD;  Location: [facility];  Service: General;  Laterality: N/A;   LYSIS OF ADHESION N/A 07/30/2017   Procedure: EXTENSIVE LYSIS OF ADHESION;  Surgeon: Griselda Miner, MD;  Location: [facility];  Service: General;  Laterality: N/A;   OMENTECTOMY N/A 07/30/2017   Procedure: PARTIAL OMENTECTOMY;  Surgeon: Griselda Miner, MD;  Location: [facility];  Service: General;  Laterality: N/A;   POLYPECTOMY  01/15/2023   Procedure: POLYPECTOMY;  Surgeon: Benancio Deeds, MD;  Location: [facility] ENDOSCOPY;  Service: Gastroenterology;;   VENTRAL HERNIA REPAIR N/A 07/30/2017   Procedure: HERNIA REPAIR VENTRAL ADULT;  Surgeon: Griselda Miner, MD;  Location: [facility];  Service: General;  Laterality: N/A;   Social History   Occupational History   Not on file  Tobacco Use   Smoking status: Never   Smokeless tobacco: Never  Vaping Use    Vaping status: Never Used  Substance and Sexual Activity   Alcohol use: No   Drug use: No   Sexual activity: Yes
